# Patient Record
Sex: Female | Born: 1950
Health system: Southern US, Community
[De-identification: ages and names within clinical notes are randomized; demographics above are authoritative.]

## PROBLEM LIST (undated history)

## (undated) DIAGNOSIS — R5383 Other fatigue: Secondary | ICD-10-CM

## (undated) DIAGNOSIS — R5381 Other malaise: Secondary | ICD-10-CM

## (undated) DIAGNOSIS — E237 Disorder of pituitary gland, unspecified: Secondary | ICD-10-CM

## (undated) DIAGNOSIS — K219 Gastro-esophageal reflux disease without esophagitis: Secondary | ICD-10-CM

## (undated) DIAGNOSIS — R52 Pain, unspecified: Secondary | ICD-10-CM

## (undated) DIAGNOSIS — I69998 Other sequelae following unspecified cerebrovascular disease: Secondary | ICD-10-CM

## (undated) DIAGNOSIS — G5600 Carpal tunnel syndrome, unspecified upper limb: Secondary | ICD-10-CM

## (undated) DIAGNOSIS — M549 Dorsalgia, unspecified: Secondary | ICD-10-CM

## (undated) DIAGNOSIS — I214 Non-ST elevation (NSTEMI) myocardial infarction: Secondary | ICD-10-CM

## (undated) DIAGNOSIS — R0989 Other specified symptoms and signs involving the circulatory and respiratory systems: Secondary | ICD-10-CM

## (undated) DIAGNOSIS — G4730 Sleep apnea, unspecified: Secondary | ICD-10-CM

## (undated) DIAGNOSIS — R51 Headache: Secondary | ICD-10-CM

## (undated) DIAGNOSIS — R579 Shock, unspecified: Secondary | ICD-10-CM

## (undated) DIAGNOSIS — M1990 Unspecified osteoarthritis, unspecified site: Secondary | ICD-10-CM

## (undated) DIAGNOSIS — R519 Headache, unspecified: Secondary | ICD-10-CM

## (undated) DIAGNOSIS — R209 Unspecified disturbances of skin sensation: Secondary | ICD-10-CM

## (undated) DIAGNOSIS — R42 Dizziness and giddiness: Secondary | ICD-10-CM

## (undated) DIAGNOSIS — Z9889 Other specified postprocedural states: Secondary | ICD-10-CM

## (undated) DIAGNOSIS — E785 Hyperlipidemia, unspecified: Secondary | ICD-10-CM

## (undated) DIAGNOSIS — I639 Cerebral infarction, unspecified: Secondary | ICD-10-CM

## (undated) DIAGNOSIS — R011 Cardiac murmur, unspecified: Secondary | ICD-10-CM

## (undated) DIAGNOSIS — R7309 Other abnormal glucose: Secondary | ICD-10-CM

## (undated) DIAGNOSIS — I10 Essential (primary) hypertension: Secondary | ICD-10-CM

## (undated) DIAGNOSIS — H539 Unspecified visual disturbance: Secondary | ICD-10-CM

## (undated) DIAGNOSIS — Z6841 Body Mass Index (BMI) 40.0 and over, adult: Secondary | ICD-10-CM

## (undated) HISTORY — DX: Dorsalgia, unspecified: M54.9

## (undated) HISTORY — DX: Disorder of pituitary gland, unspecified: E23.7

## (undated) HISTORY — DX: Gastro-esophageal reflux disease without esophagitis: K21.9

## (undated) HISTORY — DX: Other fatigue: R53.83

## (undated) HISTORY — PX: JOINT REPLACEMENT: SHX530

## (undated) HISTORY — DX: Unspecified disturbances of skin sensation: R20.9

## (undated) HISTORY — DX: Other abnormal glucose: R73.09

## (undated) HISTORY — DX: Dizziness and giddiness: R42

## (undated) HISTORY — DX: Other specified symptoms and signs involving the circulatory and respiratory systems: R09.89

## (undated) HISTORY — DX: Body Mass Index (BMI) 40.0 and over, adult: Z684

## (undated) HISTORY — DX: Hyperlipidemia, unspecified: E78.5

## (undated) HISTORY — DX: Other sequelae following unspecified cerebrovascular disease: I69.998

## (undated) HISTORY — PX: CARPAL TUNNEL RELEASE: SHX101

## (undated) HISTORY — DX: Essential (primary) hypertension: I10

## (undated) HISTORY — DX: Pain, unspecified: R52

## (undated) HISTORY — DX: Other specified postprocedural states: Z98.89

## (undated) HISTORY — DX: Carpal tunnel syndrome, unspecified upper limb: G56.00

## (undated) HISTORY — DX: Other fatigue: R53.81

## (undated) HISTORY — DX: Unspecified visual disturbance: H53.9

---

## 1972-10-15 HISTORY — PX: TUBAL LIGATION: SHX77

## 2004-09-03 ENCOUNTER — Emergency Department (HOSPITAL_COMMUNITY): Admission: EM | Admit: 2004-09-03 | Discharge: 2004-09-03 | Payer: Self-pay | Admitting: Emergency Medicine

## 2004-12-13 ENCOUNTER — Emergency Department (HOSPITAL_COMMUNITY): Admission: EM | Admit: 2004-12-13 | Discharge: 2004-12-13 | Payer: Self-pay | Admitting: Emergency Medicine

## 2005-06-06 ENCOUNTER — Emergency Department (HOSPITAL_COMMUNITY): Admission: EM | Admit: 2005-06-06 | Discharge: 2005-06-07 | Payer: Self-pay | Admitting: Emergency Medicine

## 2010-11-01 LAB — CBC
HCT: 36.9 % (ref 36.0–46.0)
Hemoglobin: 12.7 g/dL (ref 12.0–15.0)
MCH: 30.4 pg (ref 26.0–34.0)
MCHC: 34.4 g/dL (ref 30.0–36.0)
MCV: 88.3 fL (ref 78.0–100.0)
Platelets: 254 10*3/uL (ref 150–400)
RBC: 4.18 MIL/uL (ref 3.87–5.11)
RDW: 14.1 % (ref 11.5–15.5)
WBC: 5.4 10*3/uL (ref 4.0–10.5)

## 2010-11-01 LAB — DIFFERENTIAL
Basophils Absolute: 0.1 10*3/uL (ref 0.0–0.1)
Basophils Relative: 1 % (ref 0–1)
Eosinophils Absolute: 0.3 10*3/uL (ref 0.0–0.7)
Eosinophils Relative: 5 % (ref 0–5)
Lymphocytes Relative: 45 % (ref 12–46)
Lymphs Abs: 2.4 10*3/uL (ref 0.7–4.0)
Monocytes Absolute: 0.5 10*3/uL (ref 0.1–1.0)
Monocytes Relative: 9 % (ref 3–12)
Neutro Abs: 2.2 10*3/uL (ref 1.7–7.7)
Neutrophils Relative %: 40 % — ABNORMAL LOW (ref 43–77)

## 2010-11-01 LAB — BASIC METABOLIC PANEL
BUN: 11 mg/dL (ref 6–23)
CO2: 30 mEq/L (ref 19–32)
Calcium: 9.3 mg/dL (ref 8.4–10.5)
Chloride: 104 mEq/L (ref 96–112)
Creatinine, Ser: 1.02 mg/dL (ref 0.4–1.2)
GFR calc Af Amer: >60 mL/min (ref >60)
GFR calc non Af Amer: 55 mL/min — ABNORMAL LOW (ref >60)
Glucose, Bld: 117 mg/dL — ABNORMAL HIGH (ref 70–99)
Potassium: 3.7 mEq/L (ref 3.5–5.1)
Sodium: 141 mEq/L (ref 135–145)

## 2010-11-01 LAB — URINALYSIS, ROUTINE W REFLEX MICROSCOPIC
Bilirubin Urine: NEGATIVE
Hgb urine dipstick: NEGATIVE
Ketones, ur: NEGATIVE mg/dL
Nitrite: NEGATIVE
Protein, ur: NEGATIVE mg/dL
Specific Gravity, Urine: 1.019 (ref 1.005–1.030)
Urine Glucose, Fasting: NEGATIVE mg/dL
Urobilinogen, UA: 0.2 mg/dL (ref 0.0–1.0)
pH: 6 (ref 5.0–8.0)

## 2010-11-01 LAB — SURGICAL PCR SCREEN
MRSA, PCR: NEGATIVE
Staphylococcus aureus: NEGATIVE

## 2010-11-01 LAB — PROTIME-INR
INR: 1 (ref 0.00–1.49)
Prothrombin Time: 13.4 seconds (ref 11.6–15.2)

## 2010-11-01 LAB — APTT: aPTT: 30 seconds (ref 24–37)

## 2010-11-06 ENCOUNTER — Inpatient Hospital Stay (HOSPITAL_COMMUNITY)
Admission: RE | Admit: 2010-11-06 | Discharge: 2010-11-09 | Payer: Self-pay | Source: Home / Self Care | Attending: Orthopedic Surgery | Admitting: Orthopedic Surgery

## 2010-11-06 HISTORY — PX: TOTAL HIP ARTHROPLASTY: SHX124

## 2010-11-06 NOTE — H&P (Signed)
NAME:  Sherry Fitzpatrick, Sherry Fitzpatrick               ACCOUNT NO.:  [id number]  MEDICAL RECORD NO.:  [id number]          PATIENT TYPE:  INP  LOCATION:  NA                           FACILITY:  [facility]  PHYSICIAN:  Pietro Cassis. Alvan Dame, M.D.  DATE OF BIRTH:  Mar 02, 1951  DATE OF ADMISSION: DATE OF DISCHARGE:                             HISTORY & PHYSICAL   ADMISSION DIAGNOSIS:  Right hip osteoarthritis.  BRIEF HISTORY:  This is a patient that is seen here for second opinion, evaluation of her right hip.  After failed conservative treatment and other practices, she has decided to proceed with right hip arthroplasty and forego any further conservative treatment.  PAST MEDICAL HISTORY: 1. Significant for a TIA in May 2011. 2. She has hypertension. 3. She reports having a heart murmur, although I do not hear that     today. 4. She has reflux. 5. She is postmenopausal. 6. She has some occasional constipation with GERD. 7. Fatigue-type syndrome; back pain, morning stiffness, joint pain.  Her only surgical history is tubal ligation in 1975.  CURRENT MEDICATION:  List is somewhat incomplete, but she does report having: 1. Diovan 160 daily. 2. Amlodipine 5 mg daily. 3. HCTZ 25 mg a day. 4. Plavix 75 a day. 5. Aspirin 81 a day. 6. Metoprolol XL, she is unsure of her dose. 7. Tramadol 50 p.r.n. 8. Clonidine 0.3 daily. 9. Cymbalta 30 mg daily.  ALLERGIES:  PENICILLIN, CODEINE AND MOST NARCOTICS, LIKE HYDROCODONE AND OXYCODONE.  SOCIAL HISTORY:  The patient is divorced.  She has no history of tobacco use.  She has alcoholic beverage socially.  No history of street drugs. She has 2 children.  FAMILY HISTORY:  Noncontributory.  Her disposition plan is for a SNF, as she lives alone.  Her family is going to research that between now and the hospitalization.  REVIEW OF SYSTEMS:  Notable for those difficulties described in the history present illness, past medical history.  Review of systems sheet is  otherwise unremarkable.  PHYSICAL EXAMINATION:  VITAL SIGNS:  The patient is somewhat obese.  She does not report her height and age.  Her blood pressure today is 110/71, respirations are 20, pulse is 80. GENERAL HEALTH:  Poor.  She is postmenopausal. HEENT:  Within normal limits. NECK AND CHEST EXAM:  Shows that she is within normal limits with no difficulties. HEART:  I do not hear a murmur today.  Has S1, S2.  She has a history of hypertension. ABDOMINAL EXAM:  Shows her to be somewhat distended.  She has a history of GERD and constipation. GU:  Otherwise unremarkable. EXTREMITY EXAM:  Shows osteoarthritis and pain in the lower back. DERMATOLOGICALLY:  She is intact. NEUROLOGICALLY:  She has fatigue syndrome.  Her labs, EKG and chest x-ray are pending through [facility].  IMPRESSION:  Right hip osteoarthritis.  PLAN:  Right total hip arthroplasty on the 23rd with Dr. Alvan Dame.  She will not be started on Xarelto; she will return to her Plavix postoperatively.  MiraLax, Colace, iron and Robaxin were given to hertoday.  Her pain medicines will be given to her at discharge.  Russell L. Judeth Horn, RN   ______________________________ Pietro Cassis Alvan Dame, M.D.    RLW/MEDQ  D:  11/02/2010  T:  11/02/2010  Job:  [id number]  Electronically Signed by Beatris Ship NP-C on 11/03/2010 09:48:08 AM Electronically Signed by Paralee Cancel M.D. on 11/06/2010 09:36:32 AM

## 2010-11-07 LAB — ABO/RH: ABO/RH(D): A NEG

## 2010-11-07 LAB — TYPE AND SCREEN
ABO/RH(D): A NEG
Antibody Screen: NEGATIVE

## 2010-11-08 LAB — BASIC METABOLIC PANEL
BUN: 9 mg/dL (ref 6–23)
BUN: 10 mg/dL (ref 6–23)
CO2: 26 mEq/L (ref 19–32)
CO2: 27 mEq/L (ref 19–32)
Calcium: 8.3 mg/dL — ABNORMAL LOW (ref 8.4–10.5)
Calcium: 8.2 mg/dL — ABNORMAL LOW (ref 8.4–10.5)
Chloride: 105 mEq/L (ref 96–112)
Chloride: 104 mEq/L (ref 96–112)
Creatinine, Ser: 0.86 mg/dL (ref 0.4–1.2)
Creatinine, Ser: 1.04 mg/dL (ref 0.4–1.2)
GFR calc Af Amer: >60 mL/min (ref >60)
GFR calc Af Amer: >60 mL/min (ref >60)
GFR calc non Af Amer: >60 mL/min (ref >60)
GFR calc non Af Amer: 54 mL/min — ABNORMAL LOW (ref >60)
Glucose, Bld: 132 mg/dL — ABNORMAL HIGH (ref 70–99)
Glucose, Bld: 136 mg/dL — ABNORMAL HIGH (ref 70–99)
Potassium: 3.5 mEq/L (ref 3.5–5.1)
Potassium: 3.7 mEq/L (ref 3.5–5.1)
Sodium: 140 mEq/L (ref 135–145)
Sodium: 138 mEq/L (ref 135–145)

## 2010-11-08 LAB — CBC
HCT: 29.7 % — ABNORMAL LOW (ref 36.0–46.0)
HCT: 25.6 % — ABNORMAL LOW (ref 36.0–46.0)
Hemoglobin: 10.1 g/dL — ABNORMAL LOW (ref 12.0–15.0)
Hemoglobin: 8.8 g/dL — ABNORMAL LOW (ref 12.0–15.0)
MCH: 29.4 pg (ref 26.0–34.0)
MCH: 30.2 pg (ref 26.0–34.0)
MCHC: 34 g/dL (ref 30.0–36.0)
MCHC: 34.4 g/dL (ref 30.0–36.0)
MCV: 86.6 fL (ref 78.0–100.0)
MCV: 88 fL (ref 78.0–100.0)
Platelets: 285 10*3/uL (ref 150–400)
Platelets: 249 10*3/uL (ref 150–400)
RBC: 3.43 MIL/uL — ABNORMAL LOW (ref 3.87–5.11)
RBC: 2.91 MIL/uL — ABNORMAL LOW (ref 3.87–5.11)
RDW: 14.3 % (ref 11.5–15.5)
RDW: 15 % (ref 11.5–15.5)
WBC: 11.3 10*3/uL — ABNORMAL HIGH (ref 4.0–10.5)
WBC: 14.1 10*3/uL — ABNORMAL HIGH (ref 4.0–10.5)

## 2010-11-08 LAB — CARDIAC PANEL(CRET KIN+CKTOT+MB+TROPI)
CK, MB: 3.5 ng/mL (ref 0.3–4.0)
CK, MB: 3 ng/mL (ref 0.3–4.0)
Relative Index: 0.5 (ref 0.0–2.5)
Relative Index: 0.5 (ref 0.0–2.5)
Total CK: 719 U/L — ABNORMAL HIGH (ref 7–177)
Total CK: 637 U/L — ABNORMAL HIGH (ref 7–177)
Troponin I: 0.04 ng/mL (ref 0.00–0.06)
Troponin I: 0.02 ng/mL (ref 0.00–0.06)

## 2010-11-09 LAB — CARDIAC PANEL(CRET KIN+CKTOT+MB+TROPI)
CK, MB: 2.6 ng/mL (ref 0.3–4.0)
Relative Index: 0.5 (ref 0.0–2.5)
Total CK: 563 U/L — ABNORMAL HIGH (ref 7–177)
Troponin I: 0.02 ng/mL (ref 0.00–0.06)

## 2010-11-13 NOTE — Op Note (Signed)
NAME:  Sherry Fitzpatrick, Sherry Fitzpatrick               ACCOUNT NO.:  [id number]  MEDICAL RECORD NO.:  [id number]          PATIENT TYPE:  INP  LOCATION:  0009                         FACILITY:  [facility]  PHYSICIAN:  Pietro Cassis. Alvan Dame, M.D.  DATE OF BIRTH:  1951/08/25  DATE OF PROCEDURE:  11/06/2010 DATE OF DISCHARGE:                              OPERATIVE REPORT   PREOPERATIVE DIAGNOSIS:  Right hip osteoarthritis.  POSTOPERATIVE DIAGNOSIS:  Right hip osteoarthritis.  PROCEDURE:  Right total-hip replacement.  COMPONENTS USED:  DePuy hip system size 52 pinnacle cup, single cancellous screw, 36 +4 neutral Altrex liner, size 5 high Trilock stem with a 36 +1.5 Delta ceramic ball.  SURGEON:  Pietro Cassis. Alvan Dame, M.D.  ASSISTANT:  Isa Rankin. Judeth Horn, RN  ANESTHESIA:  General.  SPECIMENS:  None.  COMPLICATIONS:  None.  DRAINS:  One Hemovac.  ESTIMATED BLOOD LOSS:  Approximately 550 cc.  INDICATIONS FOR PROCEDURE:  Sherry Fitzpatrick is a 60 year old female whopresented to the office for evaluation of right hip pain.  Radiographs revealed bone-on-bone changes in the acetabulum.  She had failed to manage this conservatively with medication.  After reviewing with her the limited options available conservatively after reduction of her overall quality of life and function, she wished to proceed with hip arthroplasty.  The risks of infection, DVT, component failure, dislocation and need for revision surgery were all discussed and reviewed with this 60 year old female.  Consent was obtained for the benefit of pain relief.  PROCEDURE IN DETAIL:  The patient was brought to the operative theater. Once adequate anesthesia, preoperative antibiotics, clindamycin administered, she was positioned in the left lateral decubitus position with the right side up.  The right lower extremity was then prepped and draped in the sterile fashion.  The time-out was performed identifying the patient, the planned procedure and the  extremity.  A lateral based incision was made based off the proximal trochanter. Sharp dissection was carried to the iliotibial band and gluteal fascia. These were incised posteriorly for a posterior approach.  Short external rotators were taken down separate from the posterior capsule.  An L capsulotomy was made and the hip was dislocated.  Identifying the trochanteric fossa and landmarks of the femoral neck and head, the neck osteotomy was made based off the preoperative radiographs.  Following this, attention was first directed to the femur.  Femoral retractors were placed and the proximal femur was opened with a box cutter and then a starting reamer and then hand reamed once and then irrigated to try to prevent fat emboli.  I then began broaching with a 0 broach and broached up to a size 4 initially, which sat at the level of the neck cut.  I then packed off the femur and now attended to the acetabulum.  The acetabular retractors were placed.  The labrum was debrided.  I began reaming with a 46 reamer and reamed up to a 51 reamer with good bony bed preparation.  The 52 pinnacle cup utilizing the curved impactor was utilized and the cup impacted.  It appeared to be positioned with a portion of the anterior rim exposed, a portion  of the cup exposed superior and lateral and using the cup guide at about 35-40 degrees of abduction.  A single cancellus screw was utilized to support this. Based on the anteversion of the femoral stem and the cup position, I went ahead and impacted a 36 +4 neutral Altrex liner.  At this point, a trial reduction was carried out with a 4 broach in place, high offset neck and a 36 +1.5 ball.  During the trial reduction the hip was noted to be very stable.  The leg lengths appeared to be comparable to the down leg in comparison and there was no evidence of impingement on the soft tissues.  Given this, when I reduced the hip, I checked the torque on the 4  broach.  There was a little bit of play in it.  For that reason I ended up going up to a size 5 broach.  The 5 broach was able to be seated at the level of the neck cut where the 4 broach was.  We chose a 5 high Trilock stem.  The final component was opened and impacted.  It was maybe a millimeter above the neck cut, which I think was acceptable from a leg length standpoint.  Based on this and the trial reduction, a 36 +1.5 Delta ceramic ball was opened and impacted onto a clean and dry trunnion.  The hip was reduced.  The hip had been irrigated throughout the case and again at this point.  I placed a medium Hemovac drain deep.  I then reapproximated the iliotibial band and gluteal fascia over the top of this.  The remaining of the wound was closed with 2-0 Vicryl and the subcutaneous layer with running 4-0 Monocryl.  The hip was then cleaned, dried and dressed sterilely with Aquacel and an Aquacel dressing.  The drain site was dressed separately.  She was then extubated and brought to the recovery room in stable condition tolerating the procedure well.     Pietro Cassis Alvan Dame, M.D.     MDO/MEDQ  D:  11/06/2010  T:  11/06/2010  Job:  [id number]  Electronically Signed by Paralee Cancel M.D. on 11/13/2010 07:05:39 AM

## 2010-11-23 NOTE — Consult Note (Signed)
NAME:  Sherry Fitzpatrick, Sherry Fitzpatrick               ACCOUNT NO.:  [id number]  MEDICAL RECORD NO.:  [id number]          PATIENT TYPE:  INP  LOCATION:  1612                         FACILITY:  [facility]  PHYSICIAN:  Champ Mungo. Lovena Le, MD    DATE OF BIRTH:  09-12-1951  DATE OF CONSULTATION:  11/08/2010 DATE OF DISCHARGE:                                CONSULTATION   PRIMARY CARDIOLOGIST:  Dr. Rich Reining at [facility].  PRIMARY CARE PROVIDER:  Candace Gallus, M.D.  REQUESTING PHYSICIAN:  Pietro Cassis. Alvan Dame, M.D.  PATIENT PROFILE:  A 60 year old African-American female with history of atypical chest pain as well as multiple risk factors for coronary artery disease, who presented for right total hip replacement and had chest pain this morning.  PROBLEMS: 1. Chest pain without objective evidence of ischemia.  A.  The patient     reports having a negative Myoview approximately 5 years ago. 2. Hypertension. 3. Hyperlipidemia. 4. History of transient ischemic attack in 2004 and again May 2011. 5. Osteoarthritis.  A.  Status post right total hip replacement     January 23. 6. History of heart murmur. 7. Gastroesophageal reflux disease. 8. Constipation. 9. Chronic fatigue syndrome with back pain. a.m. stiffness, and joint     pain. 10.Status post tubal ligation 1995. 11.Obesity.  ALLERGIES:  PENICILLIN, OXYCODONE/APAP, CONTRAST.  HISTORY OF PRESENT ILLNESS:  A 60 year old female with a history of atypical chest pain in the past with negative Myoview about 5 years ago. She also has a hypertension, hyperlipidemia, obesity, and TIAs with last occurring May 2011.  The patient's activity at home has been limited by chronic fatigue and pain along with osteoarthritis and right hip pain. She left saw her primary cardiologist approximately 2 weeks ago and was felt to be low risk for pending right hip surgery.  The patient presented to [facility] January 23 and underwent right total  hip replacement and initially did well postoperatively, though she has been anemic, drop in H and H from 10/29 to 8/25.  She has complained of constipation and did have bowel movement.  This morning, she was getting up from bed and trying to get to the chair, noted a faint midsternal chest pressure associated with mild nausea, gas, and belching.  Symptoms resolved after about 5 minutes with belching.  ECG has been done and shows no acute changes.  The patient is currently symptom-free. At baseline, she denies getting chest pain with activity or in generally does not experience dyspnea, although her activity has been limited related to hip pain.  CURRENT MEDICATIONS: 1. Amlodipine 10 mg daily. 2. Vitamin D3 2000 units daily. 3. Clonidine 0.3 mg b.i.d. 4. Colace 100 mg b.i.d. 5. Cymbalta 30 mg b.i.d. 6. Enoxaparin 40 mg daily. 7. Pepcid 20 mg b.i.d. 8. Ferrous sulfate 325 mg t.i.d. 9. Hydrochlorothiazide 25 mg daily. 10.Toprol XL 25 mg daily. 11.Multivitamin daily. 12.Benicar 20 mg daily. 13.K-Dur 10 mEq daily. 14.Crestor 5 mg q.h.s. 15.Senna 1 tablet b.i.d. 16.Vitamin D daily.  FAMILY HISTORY:  Mother died of stroke at 11.  Father died at 77 with history of stroke x2 and  Alzheimer's.  She has three sisters, one with dementia, one with coronary artery disease and one who she believes is alive and well.  SOCIAL HISTORY:  She lives in [place] by herself.  She works in state housing for disabled individuals.  She does not work.  She denies tobacco, alcohol, or drug use.  She is not routinely exercising.  REVIEW OF SYSTEMS:  Positive for feeling cold.  She had chest pain this morning as outlined above.  She has chronic myalgias and arthralgias as well as weakness.  She still has some right hip pain.  She is full code. Otherwise all systems reviewed and negative.  PHYSICAL EXAMINATION:  VITAL SIGNS:  Temperature 98.2, heart rate 71, respirations 18, blood pressure 100/67,  pulse ox 93% on room air. GENERAL:  Pleasant African-American female in no acute distress, awake, alert x3.  She has normal affect. HEENT:  Normal.  Nares grossly nonfocal. SKIN:  Warm without lesions or masses. NECK:  Supple without bruits, JVD. LUNGS:  Respirations are unlabored. CARDIAC:  Regular, S1 and S2 with 2/6 systolic ejection murmur at the bilateral upper sternal borders. ABDOMEN:  Obese, soft, nontender, nondistended.  Bowel sounds present. EXTREMITIES:  Warm and dry.  No clubbing, cyanosis, or edema.  Dorsalis pedis and posterior tibial pulses 2+ bilaterally.  LABORATORY DATA:  EKG shows sinus rhythm, rate of 62, normal axis, T- wave inversion in III and V6 with ST and T flattening in aVF and V5, this is unchanged from old EKG dated October 24, 2010.  Lab work; hemoglobin 8.8, hematocrit 25.6, WBC 14.1, platelets 249,000.  Sodium 138, potassium 3.7, chloride 104, CO2 27, BUN 10, creatinine 0.04, glucose 136, calcium 8.2.  ASSESSMENT AND PLAN: 1. Chest pain without objective evidence of ischemia, somewhat     atypical chest pain this morning associated with nausea and     improved with belching.  ECG is without acute changes.  Doubt     ischemia.  The patient does have multiple risk factors; however,     will go ahead and check cardiac markers.  If negative, would not     pursue additional evaluation unless symptoms recur.  Continue     Pepcid. 2. Hypertension, well controlled on multiple medications. 3. Osteoarthritis status post right total hip replacement per ortho. 4. Anemia.  Follow.     Murray Hodgkins, ANP   ______________________________ Champ Mungo. Lovena Le, MD    CB/MEDQ  D:  11/08/2010  T:  11/08/2010  Job:  [id number]  Electronically Signed by Murray Hodgkins ANP on 11/13/2010 12:30:40 PM Electronically Signed by Cristopher Peru MD on 11/23/2010 05:17:24 PM

## 2010-11-26 NOTE — Discharge Summary (Signed)
  NAME:  Sherry Fitzpatrick, Sherry Fitzpatrick               ACCOUNT NO.:  [id number]  MEDICAL RECORD NO.:  [id number]          PATIENT TYPE:  INP  LOCATION:  1612                         FACILITY:  [facility]  PHYSICIAN:  Pietro Cassis. Alvan Dame, M.D.  DATE OF BIRTH:  [birth year]  DATE OF ADMISSION:  11/06/2010 DATE OF DISCHARGE:                              DISCHARGE SUMMARY   ADMITTING DIAGNOSIS:  Right hip osteoarthritis.  BRIEF HISTORY:  This is a patient that was seen for a second opinion and evaluation of her right hip in our office after failed conservative treatment at other practices.  She has decided to proceed with a right hip arthroplasty and forego any further conservative treatment.  HOSPITAL COURSE:  The patient was admitted on January 23 through same- day surgery, taken to the operating theater and underwent the right hip arthroplasty without any difficulties.  She was taken to PACU for recovery and brought to [facility] for further recovery and rehabilitation. Her discharge condition is good.  Since she has been on [facility] she has advanced her diet to regular.  She has been up with physical therapy and done well with that.  She will be discharged to a facility today for further rehabilitation.  She will follow up with Dr. Alvan Dame in 2 weeks.  LABORATORY DATA:  Her labs have been stable.  PHYSICAL EXAMINATION:  She is afebrile.  Her vital signs are stable.  DISCHARGE INSTRUCTIONS:  Discharge instructions were given.  Her discharge condition again is good.  She knows that she can shower.  She has an Aquacel dressing in place that needs to come off in 7 days and she will follow up with Dr. Alvan Dame in 2 weeks.  DISCHARGE MEDICATIONS: 1. Acetaminophen 325 mg one to two as needed every 4 hours. 2. Colace 100 mg twice daily. 3. Ferrous sulfate 325 mg three times a day. 4. Robaxin 500 mg every 6 hours as needed. 5. MiraLax 17 grams a day as needed. 6. Amlodipine 10 mg a day. 7. Enteric-coated aspirin 81 mg a  day. 8. Clonidine 0.3 mg twice daily. 9. Crestor 5 mg at bedtime. 10.Cymbalta 30 mg twice daily. 11.Diovan 160 mg daily. 12.Hydrochlorothiazide 25 mg every morning. 13.Loratadine 10 mg as needed. 14.Metoprolol 25 mg every evening. 15.Multivitamins daily. 16.Plavix 75 mg daily. 17.Potassium 10 mEq daily. 18.Ranitidine 150 mg twice daily. 19.Tramadol 50 mg two as needed. 20.Ventolin inhaler every 6 hours. 21.Vitamin B and D daily.     Russell L. Judeth Horn, RN   ______________________________ Pietro Cassis Alvan Dame, M.D.    RLW/MEDQ  D:  11/09/2010  T:  11/09/2010  Job:  [id number]  Electronically Signed by Beatris Ship NP-C on 11/22/2010 09:42:16 AM Electronically Signed by Paralee Cancel M.D. on 11/26/2010 09:16:46 AM

## 2011-11-20 DIAGNOSIS — F32A Depression, unspecified: Secondary | ICD-10-CM | POA: Diagnosis present

## 2011-11-20 DIAGNOSIS — K219 Gastro-esophageal reflux disease without esophagitis: Secondary | ICD-10-CM | POA: Diagnosis present

## 2011-11-20 DIAGNOSIS — F419 Anxiety disorder, unspecified: Secondary | ICD-10-CM | POA: Diagnosis present

## 2012-06-13 HISTORY — PX: TRANSPHENOIDAL / TRANSNASAL HYPOPHYSECTOMY / RESECTION PITUITARY TUMOR: SUR1382

## 2013-05-06 ENCOUNTER — Encounter: Payer: Self-pay | Admitting: Neurology

## 2013-05-06 ENCOUNTER — Ambulatory Visit (INDEPENDENT_AMBULATORY_CARE_PROVIDER_SITE_OTHER): Payer: Medicare HMO | Admitting: Neurology

## 2013-05-06 VITALS — BP 181/97 | HR 70 | Temp 97.8°F | Ht 64.0 in | Wt 241.0 lb

## 2013-05-06 DIAGNOSIS — Z8673 Personal history of transient ischemic attack (TIA), and cerebral infarction without residual deficits: Secondary | ICD-10-CM

## 2013-05-06 DIAGNOSIS — R400 Somnolence: Secondary | ICD-10-CM

## 2013-05-06 DIAGNOSIS — I10 Essential (primary) hypertension: Secondary | ICD-10-CM

## 2013-05-06 DIAGNOSIS — G4733 Obstructive sleep apnea (adult) (pediatric): Secondary | ICD-10-CM

## 2013-05-06 DIAGNOSIS — G4710 Hypersomnia, unspecified: Secondary | ICD-10-CM

## 2013-05-06 NOTE — Progress Notes (Signed)
Subjective:    Patient ID: Sherry Fitzpatrick is a 62 y.o. female.  HPI  Star Age, MD, PhD [facility] [address], [address]O. [place], [place] [address]   Dear Kieth Brightly,  I saw your patient, Sherry Fitzpatrick, upon your kind request in my neurologic clinic today for initial consultation of her sleep disorder, in particular concern for obstructive sleep apnea. The patient is unaccompanied today. As you know, Sherry Fitzpatrick is a very pleasant-year-old right-handed woman with an underlying medical history of obesity, pituitary adenoma, s/p surgery in 8/13, hyperlipidemia, prior TIAs in 2004 (passed out while exercising and confusion) and 02/2010 (R sided numbness and weakness), and hypertension, who has been experiencing daytime somnolence for the past several months. Snoring is reportedly loud.  Her typical bedtime is reported to be around 10 or 11 PM and usual wake time is around 4:30 to 5 AM. Sleep onset typically occurs within a few minutes. She reports feeling not well rested upon awakening. She wakes up on an average 2 in the middle of the night and has to go to the bathroom 2 times on a typical night. She admits to occasional morning headaches.  She reports excessive daytime somnolence (EDS) and Her Epworth Sleepiness Score (ESS) is 13/24 today. She has not fallen asleep while driving. The patient has not been taking a planned nap, but does report falling asleep inadvertently. She denies dreaming in a nap and denies feeling refreshed after a nap.  She has been known to snore for the past many years. Snoring is reportedly marked, and associated with choking sounds and witnessed apneas. The patient admits to a sense of choking or strangling feeling. There is report of rare nighttime reflux, with no nighttime cough experienced. The patient has noted mild RLS symptoms and is known to kick while asleep or before falling asleep. She is a restless sleeper and in the morning, the  bed is quite disheveled.   She denies cataplexy, sleep paralysis, hypnagogic or hypnopompic hallucinations, or sleep attacks. She does not report any vivid dreams, nightmares, dream enactments, or parasomnias, such as sleep talking or sleep walking. The patient has not had a sleep study or a home sleep test.  She consumes 1 caffeinated beverage per day, usually in the form of coffee.  Her sister has OSA and is on CPAP.  Her Past Medical History Is Significant For: Past Medical History  Diagnosis Date  . Other and unspecified hyperlipidemia   . Unspecified visual disturbance   . Generalized pain   . Alterations of sensations, late effect of cerebrovascular disease(438.6)   . Carpal tunnel syndrome   . Other postprocedural status(V45.89)   . Unspecified disorder of the pituitary gland and its hypothalamic control   . Backache, unspecified   . Esophageal reflux   . Body mass index 40.0-44.9, adult   . Unspecified essential hypertension   . Other symptoms involving cardiovascular system   . Other malaise and fatigue   . Other abnormal glucose   . Dizziness and giddiness     Her Past Surgical History Is Significant For: Past Surgical History  Procedure Laterality Date  . Pituitary excision  06/13/2012  . Hip surgery Right 11/06/2010  . Tubal ligation      Her Family History Is Significant For: Family History  Problem Relation Age of Onset  . Hypertension Sister   . Heart failure Sister   . Hypertension Daughter   . Heart failure Mother   . Hypertension  Mother   . Diabetes Father     Her Social History Is Significant For: History   Social History  . Marital Status: Divorced    Spouse Name: N/A    Number of Children: N/A  . Years of Education: N/A   Social History Main Topics  . Smoking status: Never Smoker   . Smokeless tobacco: None  . Alcohol Use: No  . Drug Use: No  . Sexually Active: None   Other Topics Concern  . None   Social History Narrative  . None     Her Allergies Are:  Allergies  Allergen Reactions  . Penicillins Hives  . Percocet (Oxycodone-Acetaminophen) Nausea And Vomiting  :   Her Current Medications Are:  Outpatient Encounter Prescriptions as of 05/06/2013  Medication Sig Dispense Refill  . albuterol (PROVENTIL HFA;VENTOLIN HFA) 108 (90 BASE) MCG/ACT inhaler Inhale 2 puffs into the lungs every 6 (six) hours as needed for wheezing.      Marland Kitchen amLODipine (NORVASC) 10 MG tablet Take 10 mg by mouth daily.      Marland Kitchen atorvastatin (LIPITOR) 80 MG tablet Take 80 mg by mouth daily.      . cholecalciferol (VITAMIN D) 400 UNITS TABS Take 400 Units by mouth daily.      . cloNIDine (CATAPRES) 0.3 MG tablet Take 0.3 mg by mouth 2 (two) times daily.      . clopidogrel (PLAVIX) 75 MG tablet Take 75 mg by mouth daily.      . fluconazole (DIFLUCAN) 150 MG tablet Take 150 mg by mouth once.      . fluticasone (FLOVENT DISKUS) 50 MCG/BLIST diskus inhaler Inhale 1 puff into the lungs 2 (two) times daily.      . hydrochlorothiazide (HYDRODIURIL) 25 MG tablet Take 25 mg by mouth daily.      Marland Kitchen loratadine (CLARITIN) 10 MG tablet Take 10 mg by mouth daily.      . meclizine (ANTIVERT) 12.5 MG tablet Take 12.5 mg by mouth as needed.      . methocarbamol (ROBAXIN) 500 MG tablet Take 500 mg by mouth 4 (four) times daily.      . metoprolol succinate (TOPROL-XL) 25 MG 24 hr tablet Take 25 mg by mouth daily.      . potassium chloride (K-DUR,KLOR-CON) 10 MEQ tablet Take 10 mEq by mouth daily.      . ranitidine (ZANTAC) 150 MG capsule Take 150 mg by mouth 2 (two) times daily.      . traMADol (ULTRAM) 50 MG tablet Take 50 mg by mouth every 6 (six) hours as needed for pain.      . valsartan (DIOVAN) 160 MG tablet Take 160 mg by mouth daily.       No facility-administered encounter medications on file as of 05/06/2013.  :  Review of Systems  Constitutional: Positive for unexpected weight change.  Eyes: Positive for visual disturbance.  Respiratory:       Snoring   Cardiovascular:       Murmur  Musculoskeletal: Positive for myalgias and arthralgias.  Psychiatric/Behavioral: Positive for dysphoric mood.    Objective:  Neurologic Exam  Physical Exam Physical Examination:   Filed Vitals:   05/06/13 0907  BP: 181/97  Pulse: 70  Temp: 97.8 F (36.6 C)    General Examination: The patient is a very pleasant 62 y.o. female in no acute distress. She appears well-developed and well-nourished and well groomed. She is obese.   HEENT: Normocephalic, atraumatic, pupils are equal, round and reactive  to light and accommodation. Funduscopic exam is normal with sharp disc margins noted. Extraocular tracking is good without limitation to gaze excursion or nystagmus noted. Normal smooth pursuit is noted. Hearing is grossly intact. Tympanic membranes are clear bilaterally. Face is symmetric with normal facial animation and normal facial sensation. Speech is clear with no dysarthria noted. There is no hypophonia. There is no lip, neck/head, jaw or voice tremor. Neck is supple with full range of passive and active motion. There are no carotid bruits on auscultation. Oropharynx exam reveals: mild mouth dryness, adequate dental hygiene and marked airway crowding, due to large tongue and narrow airway entry and elongated uvula. Mallampati is class III. Tongue protrudes centrally and palate elevates symmetrically. Tonsils are 1+. Neck size is 17.25 inches.   Chest: Clear to auscultation without wheezing, rhonchi or crackles noted.  Heart: S1+S2+0, regular and normal without murmurs, rubs or gallops noted.   Abdomen: Soft, non-tender and non-distended with normal bowel sounds appreciated on auscultation.  Extremities: There is trace pitting edema in the distal lower extremities bilaterally in the ankles only. Pedal pulses are intact.  Skin: Warm and dry without trophic changes noted. There are no varicose veins.  Musculoskeletal: exam reveals no obvious joint  deformities, tenderness or joint swelling or erythema.   Neurologically:  Mental status: The patient is awake, alert and oriented in all 4 spheres. Her memory, attention, language and knowledge are appropriate. There is no aphasia, agnosia, apraxia or anomia. Speech is clear with normal prosody and enunciation. Thought process is linear. Mood is congruent and affect is normal.  Cranial nerves are as described above under HEENT exam. In addition, shoulder shrug is normal with equal shoulder height noted. Motor exam: Normal bulk, strength and tone is noted. There is no drift, tremor or rebound. Romberg is negative. Reflexes are 1+ in the UEs and knees and absent in both ankles. Toes are downgoing bilaterally. Fine motor skills are intact with normal finger taps, normal hand movements, normal rapid alternating patting, normal foot taps and normal foot agility.  Cerebellar testing shows no dysmetria or intention tremor on finger to nose testing. Heel to shin is unremarkable bilaterally. There is no truncal or gait ataxia.  Sensory exam is intact to light touch, pinprick, vibration, temperature sense and proprioception in the upper and lower extremities.  Gait, station and balance are unremarkable. No veering to one side is noted. No leaning to one side is noted. Posture is age-appropriate and stance is narrow based. No problems turning are noted. She turns en bloc. Tandem walk is unremarkable. Intact toe and heel stance is noted.                Assessment and Plan:   In summary, Sherry Fitzpatrick is a very pleasant 62 y.o.-year old female with a history and physical exam concerning for obstructive sleep apnea (OSA). She has other RF for cardiovascular disease and takes ASA and Plavix.  I had a long chat with the patient about my findings and the diagnosis, its prognosis and treatment options. We talked about medical treatments and non-pharmacological approaches. I explained in particular the risks and  ramifications of untreated moderate to severe OSA, especially with respect to developing cardiovascular disease down the Road, including congestive heart failure, difficult to treat hypertension, cardiac arrhythmias, or stroke. Even type 2 diabetes has in part been linked to untreated OSA. We talked about trying to maintain a healthy lifestyle in general, as well as the importance of weight control.  I encouraged the patient to eat healthy, exercise daily and keep well hydrated, to keep a scheduled bedtime and wake time routine, to not skip any meals and eat healthy snacks in between meals.  I recommended the following at this time: sleep study with potential CPAP.  I explained the sleep test procedure to the patient and also outlined surgical and non-surgical treatment options of OSA including the use of a dental custom-made appliance, upper airway surgery such as pillar implants, radiofrequency surgery, tongue base surgery, and UPPP. I also explained the CPAP treatment option to the patient, who indicated that she would be willing to try CPAP if the need arises. I explained the importance of being compliant with PAP treatment, not only for insurance purposes but primarily for the patient's long term health benefit. I answered all her questions today and the patient was in agreement. I would like to see her back after the sleep study is completed and encouraged her to call with any interim questions, concerns, problems or updates.   Thank you very much for allowing me to participate in the care of this nice patient. If I can be of any further assistance to you please do not hesitate to call me at [phone].  Sincerely,   Star Age, MD, PhD

## 2013-05-06 NOTE — Patient Instructions (Signed)

## 2013-05-11 ENCOUNTER — Ambulatory Visit (INDEPENDENT_AMBULATORY_CARE_PROVIDER_SITE_OTHER): Payer: Medicare HMO | Admitting: Neurology

## 2013-05-11 DIAGNOSIS — I10 Essential (primary) hypertension: Secondary | ICD-10-CM

## 2013-05-11 DIAGNOSIS — G4761 Periodic limb movement disorder: Secondary | ICD-10-CM

## 2013-05-11 DIAGNOSIS — R400 Somnolence: Secondary | ICD-10-CM

## 2013-05-11 DIAGNOSIS — G4710 Hypersomnia, unspecified: Secondary | ICD-10-CM

## 2013-05-11 DIAGNOSIS — G479 Sleep disorder, unspecified: Secondary | ICD-10-CM

## 2013-05-11 DIAGNOSIS — Z8673 Personal history of transient ischemic attack (TIA), and cerebral infarction without residual deficits: Secondary | ICD-10-CM

## 2013-05-11 DIAGNOSIS — G4733 Obstructive sleep apnea (adult) (pediatric): Secondary | ICD-10-CM

## 2013-05-27 ENCOUNTER — Telehealth: Payer: Self-pay | Admitting: Neurology

## 2013-05-27 DIAGNOSIS — G4733 Obstructive sleep apnea (adult) (pediatric): Secondary | ICD-10-CM

## 2013-05-27 NOTE — Telephone Encounter (Signed)
Please call and notify patient that the recent sleep study confirmed the diagnosis of OSA. She did well with CPAP during the study with moderate improvement of the respiratory events. Therefore, I would like start the patient on CPAP at home. I placed the order in the chart.   Arrange for CPAP set up at home through a DME company of patient's choice and fax/route report to PCP and referring MD (if other than PCP).   The patient will also need a follow up appointment with me in 6-8 weeks post set up that has to be scheduled; help the patient schedule this (in a follow-up slot).   Please re-enforce the importance of compliance with treatment and the need for [place] to monitor compliance data.   Once you have spoken to the patient and scheduled the return appointment, you may close this encounter, thanks,   Star Age, MD, PhD [facility]

## 2013-05-28 ENCOUNTER — Encounter: Payer: Self-pay | Admitting: *Deleted

## 2013-05-28 NOTE — Telephone Encounter (Signed)
Called patient to discuss sleep study results.  Discussed findings, recommendations and follow up care.  Patient understood well and all questions were answered.   She is not excited about CPAP therapy, I explained the next three months as a trial period.  I also explained that once she gets the machine, Dr. Rexene Alberts will want to follow up in 6-8 weeks after therapy initiation to make sure it is helping her.  Her orders will go to [place] due to her insurance [facility]). A copy of the sleep study interpretive report as well as a letter with info regarding contact info for the DME company, the importance of CPAP compliance, and the date of the follow up appointment info will be mailed to the patient's home.

## 2015-04-14 ENCOUNTER — Encounter (HOSPITAL_COMMUNITY): Payer: Self-pay | Admitting: Emergency Medicine

## 2015-04-14 ENCOUNTER — Observation Stay (HOSPITAL_COMMUNITY): Payer: Medicare PPO

## 2015-04-14 ENCOUNTER — Emergency Department (HOSPITAL_COMMUNITY): Payer: Medicare PPO

## 2015-04-14 ENCOUNTER — Observation Stay (HOSPITAL_COMMUNITY)
Admission: EM | Admit: 2015-04-14 | Discharge: 2015-04-16 | Disposition: A | Discharge status: Home / Self Care | Payer: Medicare PPO | Attending: Internal Medicine | Admitting: Internal Medicine

## 2015-04-14 DIAGNOSIS — Z6841 Body Mass Index (BMI) 40.0 and over, adult: Secondary | ICD-10-CM

## 2015-04-14 DIAGNOSIS — R072 Precordial pain: Secondary | ICD-10-CM | POA: Diagnosis not present

## 2015-04-14 DIAGNOSIS — E785 Hyperlipidemia, unspecified: Secondary | ICD-10-CM | POA: Diagnosis not present

## 2015-04-14 DIAGNOSIS — R739 Hyperglycemia, unspecified: Secondary | ICD-10-CM | POA: Diagnosis present

## 2015-04-14 DIAGNOSIS — R079 Chest pain, unspecified: Principal | ICD-10-CM | POA: Insufficient documentation

## 2015-04-14 DIAGNOSIS — J4520 Mild intermittent asthma, uncomplicated: Secondary | ICD-10-CM

## 2015-04-14 DIAGNOSIS — I10 Essential (primary) hypertension: Secondary | ICD-10-CM | POA: Insufficient documentation

## 2015-04-14 DIAGNOSIS — R531 Weakness: Secondary | ICD-10-CM

## 2015-04-14 DIAGNOSIS — I639 Cerebral infarction, unspecified: Secondary | ICD-10-CM

## 2015-04-14 DIAGNOSIS — R200 Anesthesia of skin: Secondary | ICD-10-CM

## 2015-04-14 DIAGNOSIS — J45909 Unspecified asthma, uncomplicated: Secondary | ICD-10-CM | POA: Diagnosis present

## 2015-04-14 HISTORY — DX: Cerebral infarction, unspecified: I63.9

## 2015-04-14 HISTORY — DX: Unspecified osteoarthritis, unspecified site: M19.90

## 2015-04-14 HISTORY — DX: Sleep apnea, unspecified: G47.30

## 2015-04-14 HISTORY — DX: Cardiac murmur, unspecified: R01.1

## 2015-04-14 HISTORY — DX: Headache, unspecified: R51.9

## 2015-04-14 HISTORY — DX: Headache: R51

## 2015-04-14 LAB — COMPREHENSIVE METABOLIC PANEL
ALT: 16 U/L (ref 14–54)
AST: 22 U/L (ref 15–41)
Albumin: 3.7 g/dL (ref 3.5–5.0)
Alkaline Phosphatase: 81 U/L (ref 38–126)
Anion gap: 7 (ref 5–15)
BUN: 15 mg/dL (ref 6–20)
CO2: 29 mmol/L (ref 22–32)
Calcium: 8.6 mg/dL — ABNORMAL LOW (ref 8.9–10.3)
Chloride: 104 mmol/L (ref 101–111)
Creatinine, Ser: 0.95 mg/dL (ref 0.44–1.00)
GFR calc Af Amer: >60 mL/min (ref >60)
GFR calc non Af Amer: >60 mL/min (ref >60)
Glucose, Bld: 114 mg/dL — ABNORMAL HIGH (ref 65–99)
Potassium: 3.6 mmol/L (ref 3.5–5.1)
Sodium: 140 mmol/L (ref 135–145)
TOTAL PROTEIN: 7.7 g/dL (ref 6.5–8.1)
Total Bilirubin: 0.4 mg/dL (ref 0.3–1.2)

## 2015-04-14 LAB — URINALYSIS, ROUTINE W REFLEX MICROSCOPIC
Bilirubin Urine: NEGATIVE
GLUCOSE, UA: NEGATIVE mg/dL
Hgb urine dipstick: NEGATIVE
Ketones, ur: NEGATIVE mg/dL
Leukocytes, UA: NEGATIVE
Nitrite: NEGATIVE
PROTEIN: NEGATIVE mg/dL
SPECIFIC GRAVITY, URINE: 1.016 (ref 1.005–1.030)
Urobilinogen, UA: 0.2 mg/dL (ref 0.0–1.0)
pH: 6.5 (ref 5.0–8.0)

## 2015-04-14 LAB — CBC
HCT: 36.9 % (ref 36.0–46.0)
HEMOGLOBIN: 12.3 g/dL (ref 12.0–15.0)
MCH: 29.4 pg (ref 26.0–34.0)
MCHC: 33.3 g/dL (ref 30.0–36.0)
MCV: 88.3 fL (ref 78.0–100.0)
Platelets: 294 10*3/uL (ref 150–400)
RBC: 4.18 MIL/uL (ref 3.87–5.11)
RDW: 15.1 % (ref 11.5–15.5)
WBC: 5.8 10*3/uL (ref 4.0–10.5)

## 2015-04-14 LAB — CBG MONITORING, ED: GLUCOSE-CAPILLARY: 106 mg/dL — AB (ref 65–99)

## 2015-04-14 LAB — DIFFERENTIAL
Basophils Absolute: 0 10*3/uL (ref 0.0–0.1)
Basophils Relative: 1 % (ref 0–1)
Eosinophils Absolute: 0.2 10*3/uL (ref 0.0–0.7)
Eosinophils Relative: 3 % (ref 0–5)
LYMPHS ABS: 2.6 10*3/uL (ref 0.7–4.0)
LYMPHS PCT: 44 % (ref 12–46)
MONO ABS: 0.5 10*3/uL (ref 0.1–1.0)
Monocytes Relative: 9 % (ref 3–12)
NEUTROS PCT: 43 % (ref 43–77)
Neutro Abs: 2.5 10*3/uL (ref 1.7–7.7)

## 2015-04-14 LAB — I-STAT TROPONIN, ED
TROPONIN I, POC: 0.01 ng/mL (ref 0.00–0.08)
Troponin i, poc: 0 ng/mL (ref 0.00–0.08)

## 2015-04-14 LAB — PROTIME-INR
INR: 0.96 (ref 0.00–1.49)
Prothrombin Time: 13 seconds (ref 11.6–15.2)

## 2015-04-14 LAB — APTT: aPTT: 29 seconds (ref 24–37)

## 2015-04-14 LAB — TROPONIN I

## 2015-04-14 MED ORDER — NITROGLYCERIN 0.4 MG SL SUBL: 0.4000 mg | SUBLINGUAL_TABLET | SUBLINGUAL | Status: DC | PRN

## 2015-04-14 MED ORDER — TRAMADOL HCL 50 MG PO TABS: 50.0000 mg | ORAL_TABLET | Freq: Four times a day (QID) | ORAL | Status: DC | PRN

## 2015-04-14 MED ORDER — LORATADINE 10 MG PO TABS
10.0000 mg | ORAL_TABLET | Freq: Every day | ORAL | Status: DC
  Administered 2015-04-14 – 2015-04-16 (×3): 10 mg via ORAL
  Filled 2015-04-14 (×3): qty 1

## 2015-04-14 MED ORDER — CLOPIDOGREL BISULFATE 75 MG PO TABS
75.0000 mg | ORAL_TABLET | Freq: Every day | ORAL | Status: DC
  Administered 2015-04-15 – 2015-04-16 (×2): 75 mg via ORAL
  Filled 2015-04-14 (×2): qty 1

## 2015-04-14 MED ORDER — FAMOTIDINE 20 MG PO TABS
20.0000 mg | ORAL_TABLET | Freq: Two times a day (BID) | ORAL | Status: DC
  Administered 2015-04-14 – 2015-04-16 (×4): 20 mg via ORAL
  Filled 2015-04-14 (×4): qty 1

## 2015-04-14 MED ORDER — ACETAMINOPHEN 325 MG PO TABS
650.0000 mg | ORAL_TABLET | ORAL | Status: DC | PRN
  Administered 2015-04-14 – 2015-04-16 (×2): 650 mg via ORAL
  Filled 2015-04-14 (×2): qty 2

## 2015-04-14 MED ORDER — METOPROLOL SUCCINATE ER 25 MG PO TB24
25.0000 mg | ORAL_TABLET | Freq: Every day | ORAL | Status: DC
  Filled 2015-04-14: qty 1

## 2015-04-14 MED ORDER — CLONIDINE HCL 0.3 MG PO TABS
0.3000 mg | ORAL_TABLET | Freq: Two times a day (BID) | ORAL | Status: DC
  Administered 2015-04-14 – 2015-04-16 (×4): 0.3 mg via ORAL
  Filled 2015-04-14 (×3): qty 1
  Filled 2015-04-14: qty 3

## 2015-04-14 MED ORDER — MORPHINE SULFATE 4 MG/ML IJ SOLN
4.0000 mg | Freq: Once | INTRAMUSCULAR | Status: AC
  Administered 2015-04-14: 4 mg via INTRAVENOUS
  Filled 2015-04-14: qty 1

## 2015-04-14 MED ORDER — ONDANSETRON HCL 4 MG/2ML IJ SOLN
4.0000 mg | Freq: Four times a day (QID) | INTRAMUSCULAR | Status: DC | PRN
  Filled 2015-04-14: qty 2

## 2015-04-14 MED ORDER — BUDESONIDE 0.25 MG/2ML IN SUSP
0.2500 mg | Freq: Two times a day (BID) | RESPIRATORY_TRACT | Status: DC
  Administered 2015-04-15 – 2015-04-16 (×3): 0.25 mg via RESPIRATORY_TRACT
  Filled 2015-04-14 (×3): qty 2

## 2015-04-14 MED ORDER — FLUTICASONE PROPIONATE (INHAL) 50 MCG/BLIST IN AEPB: 1.0000 | INHALATION_SPRAY | Freq: Two times a day (BID) | RESPIRATORY_TRACT | Status: DC

## 2015-04-14 MED ORDER — STROKE: EARLY STAGES OF RECOVERY BOOK
Freq: Once | Status: DC
  Filled 2015-04-14: qty 1

## 2015-04-14 MED ORDER — ATORVASTATIN CALCIUM 80 MG PO TABS
80.0000 mg | ORAL_TABLET | Freq: Every day | ORAL | Status: DC
  Administered 2015-04-15: 80 mg via ORAL
  Filled 2015-04-14: qty 1

## 2015-04-14 MED ORDER — ENOXAPARIN SODIUM 40 MG/0.4ML ~~LOC~~ SOLN
40.0000 mg | SUBCUTANEOUS | Status: DC
  Administered 2015-04-14 – 2015-04-15 (×2): 40 mg via SUBCUTANEOUS
  Filled 2015-04-14 (×2): qty 0.4

## 2015-04-14 MED ORDER — IRBESARTAN 150 MG PO TABS
150.0000 mg | ORAL_TABLET | Freq: Every day | ORAL | Status: DC
Start: 2015-04-15 — End: 2015-04-16
  Administered 2015-04-15 – 2015-04-16 (×2): 150 mg via ORAL
  Filled 2015-04-14 (×2): qty 1

## 2015-04-14 MED ORDER — SENNOSIDES-DOCUSATE SODIUM 8.6-50 MG PO TABS: 1.0000 | ORAL_TABLET | Freq: Every evening | ORAL | Status: DC | PRN

## 2015-04-14 MED ORDER — ASPIRIN 81 MG PO CHEW
324.0000 mg | CHEWABLE_TABLET | Freq: Once | ORAL | Status: AC
  Administered 2015-04-14: 324 mg via ORAL
  Filled 2015-04-14: qty 4

## 2015-04-14 MED ORDER — CHOLECALCIFEROL 10 MCG (400 UNIT) PO TABS
400.0000 [IU] | ORAL_TABLET | Freq: Every day | ORAL | Status: DC
  Administered 2015-04-14 – 2015-04-16 (×3): 400 [IU] via ORAL
  Filled 2015-04-14 (×3): qty 1

## 2015-04-14 MED ORDER — ACETAMINOPHEN 650 MG RE SUPP: 650.0000 mg | RECTAL | Status: DC | PRN

## 2015-04-14 MED ORDER — ALBUTEROL SULFATE (2.5 MG/3ML) 0.083% IN NEBU: 3.0000 mL | INHALATION_SOLUTION | Freq: Four times a day (QID) | RESPIRATORY_TRACT | Status: DC | PRN

## 2015-04-14 MED ORDER — AMLODIPINE BESYLATE 10 MG PO TABS
10.0000 mg | ORAL_TABLET | Freq: Every day | ORAL | Status: DC
  Administered 2015-04-15 – 2015-04-16 (×2): 10 mg via ORAL
  Filled 2015-04-14 (×2): qty 1

## 2015-04-14 MED ORDER — HYDROCHLOROTHIAZIDE 25 MG PO TABS
25.0000 mg | ORAL_TABLET | Freq: Every day | ORAL | Status: DC
Start: 2015-04-15 — End: 2015-04-16
  Administered 2015-04-15 – 2015-04-16 (×2): 25 mg via ORAL
  Filled 2015-04-14 (×2): qty 1

## 2015-04-14 NOTE — ED Notes (Signed)
RN called and made CT aware of code stroke order, pt being transported to CT

## 2015-04-14 NOTE — ED Notes (Signed)
Attempted to call report, RN not available 

## 2015-04-14 NOTE — ED Notes (Addendum)
Pt reports mid chest pain radiating to right neck and right arm (describes right arm as "sore). Associated dizziness and nausea with pain-all symptoms started around 1000. Denies SOB/back pain/vomiting/back pain. Has had two strokes in the past (2004 and 2011). Takes Plavix for this. Speaking full/clear sentences. RR even/unlabored. Has slight right sided mouth droop. Decreased strength with right hand grip (patient endorses pain and soreness on right arm but says she also feels weaker on the right side). Says last time she had a stroke she had right sided weakness for approximately 4 days but returned to baseline after this. Unable to determine if patient has absent or present drift d/t right arm pain. Able to stand and pivot to the bed. Decreased strength with right leg on neurological assessment. No other c/c.

## 2015-04-14 NOTE — ED Notes (Signed)
Pt refusing to get MRI done at [facility], wants MRI done at [facility].

## 2015-04-14 NOTE — ED Provider Notes (Signed)
CSN: [id number]     Arrival date & time 04/14/15  1222 History   First MD Initiated Contact with Patient 04/14/15 1251     Chief Complaint  Patient presents with  . Chest Pain  . Weakness    Right-Sided     HPI Pt had some general malaise yesterday.  When she woke up she started feeling lightheaded.  She then started to have a bad headache and experienced pain down in her neck and her shoulder.  She also felt a tightness in her chest when she felt the dizziness this am.    She is having weakness on the right side.  In the past she had a stroke that affected her right side but those symptoms completely recovered.  No vomiting or fever.   No history of heart disease or lung disease.  No history of PE or DVT.  She does have history of headaches in the past.  She used to have them a lot but was ultimately found to have a pituitary tumor.  She had surgery for this.  Past Medical History  Diagnosis Date  . Other and unspecified hyperlipidemia   . Unspecified visual disturbance   . Generalized pain   . Alterations of sensations, late effect of cerebrovascular disease(438.6)   . Carpal tunnel syndrome   . Other postprocedural status(V45.89)   . Unspecified disorder of the pituitary gland and its hypothalamic control   . Backache, unspecified   . Esophageal reflux   . Body mass index 40.0-44.9, adult   . Unspecified essential hypertension   . Other symptoms involving cardiovascular system   . Other malaise and fatigue   . Other abnormal glucose   . Dizziness and giddiness    Past Surgical History  Procedure Laterality Date  . Pituitary excision  06/13/2012  . Hip surgery Right 11/06/2010  . Tubal ligation     Family History  Problem Relation Age of Onset  . Hypertension Sister   . Heart failure Sister   . Hypertension Daughter   . Heart failure Mother   . Hypertension Mother   . Diabetes Father    History  Substance Use Topics  . Smoking status: Never Smoker   . Smokeless  tobacco: Not on file  . Alcohol Use: No   OB History    No data available     Review of Systems  Constitutional: Negative for fever.  Respiratory: Negative for cough.   Musculoskeletal: Negative for joint swelling.  All other systems reviewed and are negative.     Allergies  Penicillins and Percocet  Home Medications   Prior to Admission medications   Medication Sig Start Date End Date Taking? Authorizing Provider  albuterol (PROVENTIL HFA;VENTOLIN HFA) 108 (90 BASE) MCG/ACT inhaler Inhale 2 puffs into the lungs every 6 (six) hours as needed for wheezing.   Yes Historical Provider, MD  amLODipine (NORVASC) 10 MG tablet Take 10 mg by mouth daily.   Yes Historical Provider, MD  atorvastatin (LIPITOR) 80 MG tablet Take 80 mg by mouth daily.   Yes Historical Provider, MD  cholecalciferol (VITAMIN D) 400 UNITS TABS Take 400 Units by mouth daily.   Yes Historical Provider, MD  cloNIDine (CATAPRES) 0.3 MG tablet Take 0.3 mg by mouth 2 (two) times daily.   Yes Historical Provider, MD  clopidogrel (PLAVIX) 75 MG tablet Take 75 mg by mouth daily.   Yes Historical Provider, MD  fluticasone (FLOVENT DISKUS) 50 MCG/BLIST diskus inhaler Inhale 1 puff into the  lungs 2 (two) times daily.   Yes Historical Provider, MD  hydrochlorothiazide (HYDRODIURIL) 25 MG tablet Take 25 mg by mouth daily.   Yes Historical Provider, MD  loratadine (CLARITIN) 10 MG tablet Take 10 mg by mouth daily.   Yes Historical Provider, MD  metoprolol succinate (TOPROL-XL) 25 MG 24 hr tablet Take 25 mg by mouth daily.   Yes Historical Provider, MD  ranitidine (ZANTAC) 150 MG capsule Take 150 mg by mouth 2 (two) times daily.   Yes Historical Provider, MD  traMADol (ULTRAM) 50 MG tablet Take 50 mg by mouth every 6 (six) hours as needed for pain.   Yes Historical Provider, MD  valsartan (DIOVAN) 160 MG tablet Take 160 mg by mouth daily.   Yes Historical Provider, MD   BP 142/67 mmHg  Pulse 58  Temp(Src) 98.2 F (36.8 C)  (Oral)  Resp 20  SpO2 95% Physical Exam  Constitutional: She is oriented to person, place, and time. She appears well-developed and well-nourished. No distress.  HENT:  Head: Normocephalic and atraumatic.  Right Ear: External ear normal.  Left Ear: External ear normal.  Mouth/Throat: Oropharynx is clear and moist.  Eyes: Conjunctivae are normal. Right eye exhibits no discharge. Left eye exhibits no discharge. No scleral icterus.  Neck: Neck supple. No tracheal deviation present.  Cardiovascular: Normal rate, regular rhythm and intact distal pulses.   Pulmonary/Chest: Effort normal and breath sounds normal. No stridor. No respiratory distress. She has no wheezes. She has no rales.  Abdominal: Soft. Bowel sounds are normal. She exhibits no distension. There is no tenderness. There is no rebound and no guarding.  Musculoskeletal: She exhibits no edema or tenderness.  Neurological: She is alert and oriented to person, place, and time. She has normal strength. No cranial nerve deficit (no facial droop, extraocular movements intact, no slurred speech) or sensory deficit. She exhibits normal muscle tone. She displays no seizure activity. Coordination normal.  Weakness in right arm and right leg compared to left,  able to hold both legs off bed for 5 seconds, sensation intact in all extremities, no visual field cuts, no left or right sided neglect, normal finger-nose exam bilaterally, no nystagmus noted   Skin: Skin is warm and dry. No rash noted.  Psychiatric: She has a normal mood and affect.  Nursing note and vitals reviewed.   ED Course  Procedures (including critical care time) Labs Review Labs Reviewed  COMPREHENSIVE METABOLIC PANEL - Abnormal; Notable for the following:    Glucose, Bld 114 (*)    Calcium 8.6 (*)    All other components within normal limits  CBG MONITORING, ED - Abnormal; Notable for the following:    Glucose-Capillary 106 (*)    All other components within normal  limits  PROTIME-INR  APTT  CBC  DIFFERENTIAL  URINALYSIS, ROUTINE W REFLEX MICROSCOPIC (NOT AT [facility])  I-STAT TROPOININ, ED  I-STAT TROPOININ, ED    Imaging Review Ct Head Wo Contrast  04/14/2015   CLINICAL DATA:  Pt reports mid chest pain radiating to right neck and right arm (describes right arm as "sore). Associated dizziness and nausea with pain-all symptoms started around 1000. Denies SOB/back pain/vomiting/back pain. Has had two strokes in the past (2004 and 2011). Takes Plavix for this. Speaking full/clear sentences. RR even/unlabored. Has slight right sided mouth droop. Decreased strength with right hand grip (patient endorses pain and soreness on right arm but says she also feels weaker on the right side). Says last time she had a  stroke she had right sided weakness for approximately 4 days but returned to baseline after this. Unable to determine if patient has absent or present drift d/t right arm pain. Able to stand and pivot to the bed. Decreased strength with right leg on neurological assessment. No other c/c.  EXAM: CT HEAD WITHOUT CONTRAST  TECHNIQUE: Contiguous axial images were obtained from the base of the skull through the vertex without intravenous contrast.  COMPARISON:  06/07/2005  FINDINGS: The ventricles are normal in size and configuration. There are no parenchymal masses or mass effect. There is no evidence of a recent cortical infarct. Mild patchy white matter hypoattenuation is noted consistent with chronic microvascular ischemic change.  There are no extra-axial masses or abnormal fluid collections.  There is no intracranial hemorrhage.  No skull lesion. Visualized sinuses and mastoid air cells are clear.  IMPRESSION: 1. No acute intracranial abnormalities. These results were called by telephone at the time of interpretation on 04/14/2015 at 1:30 pm to Dr. Dorie Rank , who verbally acknowledged these results. 2. Mild chronic microvascular ischemic change.   Electronically Signed    By: Lajean Manes M.D.   On: 04/14/2015 13:31     EKG Interpretation   Date/Time:  Thursday April 14 2015 12:29:15 EDT Ventricular Rate:  64 PR Interval:  193 QRS Duration: 84 QT Interval:  399 QTC Calculation: 412 R Axis:   85 Text Interpretation:  Sinus rhythm Borderline right axis deviation  Nonspecific repol abnormality, diffuse leads Baseline wander in lead(s) V6  Poor data quality inferior and lateral t wave changes more pronounced  since last tracing Confirmed by Argelia Formisano  MD-J, [name] [id number]) on 04/14/2015  12:52:33 PM      MDM   Final diagnoses:  Weakness  Chest pain, unspecified chest pain type    Patient's symptoms are concerning for the possibility of an occult stroke. She is outside of any TPA criteria. She noticed her symptoms when she first woke up this morning. Last time known to be normal was last evening.  I will consult with medical service regarding admission for further evaluation, including probably MRI.  Patient also complains of chest pain. Initial troponin was normal. She does have some nonspecific EKG findings. Plan on serial cardiac enzymes    Dorie Rank, MD 04/14/15 1515

## 2015-04-14 NOTE — ED Notes (Addendum)
Patient transported to x-ray. ?

## 2015-04-14 NOTE — ED Notes (Signed)
Patient transported to MRI 

## 2015-04-14 NOTE — ED Notes (Signed)
Bed: WA09 Expected date:  Expected time:  Means of arrival:  Comments: hold 

## 2015-04-14 NOTE — ED Notes (Addendum)
Pt state she does not have to use bathroom at this time. Will info staff when ready to give urine sample

## 2015-04-14 NOTE — H&P (Addendum)
[facility] History and Physical  Sherry Fitzpatrick [id number] DOB: 1951-01-04 DOA: 04/14/2015  Referring physician:  Dorie Rank PCP:  Berkley Harvey, NP   Chief Complaint:  Right sided weakness  HPI:  The patient is a 64 y.o. year-old female with history of previous stroke, hypertension, hyperlipidemia, borderline diabetes, GERD, hx of pituitary tumor excision who presents with chest pain and right sided pain and weakness.  The patient was last at their baseline health two days ago.  The day prior to admission she felt fatigued. She states she woke up today around 4 AM and felt okay. She was able to fix breakfast without problem and drove to her daughter's house to babysit her granddaughter. She denied any focal numbness, tingling, weakness, confusion, slurred speech at that time. She also did not have any shortness of breath or chest pain. Around 9 AM she suddenly developed a bitemporal headache, mild fatigue, and felt very heavy all over. About 30 minutes later, she developed moderate substernal chest pressure which radiated across her entire chest but not to her back. Her chest pain waxed and waned. She is not sure how long the episodes lasted for but not more than several minutes. She had some associated nausea and shortness of breath. She simultaneously developed some weakness of the right upper and right lower extremities with some possible numbness of the right hand. She denied blurred vision, confusion, slurred speech. She was able to call her daughter who came to pick her up and bring her to the hospital.  In the ER, her VS were notable for mild sinus bradycardia and mildly elevated blood pressures.  Labs notable for mildly low calcium and mildly elevated glucose.  UA negative.  Head CT without acute abnormality but incidentally saw mild chronic microvascular ischemic change.  CXR is pending.  She is being admitted for chest pain and TIA vs. acute stroke.  In the emergency department, she  was given aspirin and morphine.  She was reportedly outside of the window for tPA upon arrival to the ER (symptoms present since before 9AM and came to ER at almost 1PM).  Review of Systems:  General:  Denies fevers, chills, weight loss or gain HEENT:  Denies changes to hearing and vision, rhinorrhea, sinus congestion, mild sore throat for the last few days CV:  Denies palpitations, lower extremity edema.  PULM:  Denies wheezing, cough.   GI:  Positive nausea, she vomited several times in the emergency department nonbilious, nonbloody, denies constipation, diarrhea.   GU:  Denies dysuria, frequency, urgency ENDO:  Denies polyuria, polydipsia.   HEME:  Denies hematemesis, blood in stools, melena, abnormal bruising or bleeding.  LYMPH:  Denies lymphadenopathy.   MSK:  Denies arthralgias, myalgias.   DERM:  Denies skin rash or ulcer.   NEURO:  Per history of present illness  PSYCH:  Denies anxiety and depression.    Past Medical History  Diagnosis Date  . Other and unspecified hyperlipidemia   . Unspecified visual disturbance   . Generalized pain   . Alterations of sensations, late effect of cerebrovascular disease(438.6)   . Carpal tunnel syndrome   . Other postprocedural status(V45.89)   . Unspecified disorder of the pituitary gland and its hypothalamic control   . Backache, unspecified   . Esophageal reflux   . Body mass index 40.0-44.9, adult   . Unspecified essential hypertension   . Other symptoms involving cardiovascular system   . Other malaise and fatigue   . Other abnormal glucose   .  Dizziness and giddiness   . Asthma    Past Surgical History  Procedure Laterality Date  . Pituitary excision  06/13/2012  . Hip surgery Right 11/06/2010  . Tubal ligation     Social History:  reports that she has never smoked. She does not have any smokeless tobacco history on file. She reports that she does not drink alcohol or use illicit drugs.  Allergies  Allergen Reactions  .  Penicillins Hives  . Percocet [Oxycodone-Acetaminophen] Nausea And Vomiting    Family History  Problem Relation Age of Onset  . Hypertension Sister   . Heart failure Sister   . Hypertension Daughter   . Heart failure Mother   . Hypertension Mother   . Diabetes Father      Prior to Admission medications   Medication Sig Start Date End Date Taking? Authorizing Provider  albuterol (PROVENTIL HFA;VENTOLIN HFA) 108 (90 BASE) MCG/ACT inhaler Inhale 2 puffs into the lungs every 6 (six) hours as needed for wheezing.   Yes Historical Provider, MD  amLODipine (NORVASC) 10 MG tablet Take 10 mg by mouth daily.   Yes Historical Provider, MD  atorvastatin (LIPITOR) 80 MG tablet Take 80 mg by mouth daily.   Yes Historical Provider, MD  cholecalciferol (VITAMIN D) 400 UNITS TABS Take 400 Units by mouth daily.   Yes Historical Provider, MD  cloNIDine (CATAPRES) 0.3 MG tablet Take 0.3 mg by mouth 2 (two) times daily.   Yes Historical Provider, MD  clopidogrel (PLAVIX) 75 MG tablet Take 75 mg by mouth daily.   Yes Historical Provider, MD  fluticasone (FLOVENT DISKUS) 50 MCG/BLIST diskus inhaler Inhale 1 puff into the lungs 2 (two) times daily.   Yes Historical Provider, MD  hydrochlorothiazide (HYDRODIURIL) 25 MG tablet Take 25 mg by mouth daily.   Yes Historical Provider, MD  loratadine (CLARITIN) 10 MG tablet Take 10 mg by mouth daily.   Yes Historical Provider, MD  metoprolol succinate (TOPROL-XL) 25 MG 24 hr tablet Take 25 mg by mouth daily.   Yes Historical Provider, MD  ranitidine (ZANTAC) 150 MG capsule Take 150 mg by mouth 2 (two) times daily.   Yes Historical Provider, MD  traMADol (ULTRAM) 50 MG tablet Take 50 mg by mouth every 6 (six) hours as needed for pain.   Yes Historical Provider, MD  valsartan (DIOVAN) 160 MG tablet Take 160 mg by mouth daily.   Yes Historical Provider, MD   Physical Exam: Filed Vitals:   04/14/15 1615 04/14/15 1620 04/14/15 1630 04/14/15 1640  BP: 150/76 149/69  149/76   Pulse: 59 57 55   Temp:    97.6 F (36.4 C)  TempSrc:      Resp: 15 14 18    SpO2: 98% 99% 98%      General:  Obese female, no acute distress, vomited several times during my exam  Eyes:  PERRL, anicteric, non-injected.  ENT:  Nares clear.  OP clear, non-erythematous without plaques or exudates.  MMM.  Neck:  Supple without TM or JVD.    Lymph:  No cervical, supraclavicular, or submandibular LAD.  Cardiovascular:  RRR, normal S1, S2, without m/r/g.  2+ pulses, warm extremities  Respiratory:  CTA bilaterally without increased WOB.  Abdomen:  NABS.  Soft, ND/NT.    Skin:  No rashes or focal lesions.  Musculoskeletal:  Normal bulk and tone.  No LE edema.  Psychiatric:  A & O x 4.  Appropriate affect.  Neurologic:  CN 3-12 intact.  5/5 strength left  upper and left lower extremities.  4 out of 5 strength right upper and right lower extremities. Sensation diminished on the right hand, forearm, and leg.  Sensation on her face was intact.  Labs on Admission:  Basic Metabolic Panel:  Recent Labs Lab 04/14/15 1308  NA 140  K 3.6  CL 104  CO2 29  GLUCOSE 114*  BUN 15  CREATININE 0.95  CALCIUM 8.6*   Liver Function Tests:  Recent Labs Lab 04/14/15 1308  AST 22  ALT 16  ALKPHOS 81  BILITOT 0.4  PROT 7.7  ALBUMIN 3.7   No results for input(s): LIPASE, AMYLASE in the last 168 hours. No results for input(s): AMMONIA in the last 168 hours. CBC:  Recent Labs Lab 04/14/15 1308  WBC 5.8  NEUTROABS 2.5  HGB 12.3  HCT 36.9  MCV 88.3  PLT 294   Cardiac Enzymes: No results for input(s): CKTOTAL, CKMB, CKMBINDEX, TROPONINI in the last 168 hours.  BNP (last 3 results) No results for input(s): BNP in the last 8760 hours.  ProBNP (last 3 results) No results for input(s): PROBNP in the last 8760 hours.  CBG:  Recent Labs Lab 04/14/15 1315  GLUCAP 106*    Radiological Exams on Admission: Dg Chest 2 View  04/14/2015   CLINICAL DATA:  Chest  tightness.  Right-sided weakness.  Dizziness.  EXAM: CHEST  2 VIEW  COMPARISON:  10/30/2010  FINDINGS: Cardiac silhouette is borderline enlarged. No mediastinal or hilar masses or convincing adenopathy.  Clear lungs.  No pleural effusion or pneumothorax.  Bony thorax is demineralized but intact.  IMPRESSION: No acute cardiopulmonary disease.   Electronically Signed   By: Lajean Manes M.D.   On: 04/14/2015 15:49   Ct Head Wo Contrast  04/14/2015   CLINICAL DATA:  Pt reports mid chest pain radiating to right neck and right arm (describes right arm as "sore). Associated dizziness and nausea with pain-all symptoms started around 1000. Denies SOB/back pain/vomiting/back pain. Has had two strokes in the past (2004 and 2011). Takes Plavix for this. Speaking full/clear sentences. RR even/unlabored. Has slight right sided mouth droop. Decreased strength with right hand grip (patient endorses pain and soreness on right arm but says she also feels weaker on the right side). Says last time she had a stroke she had right sided weakness for approximately 4 days but returned to baseline after this. Unable to determine if patient has absent or present drift d/t right arm pain. Able to stand and pivot to the bed. Decreased strength with right leg on neurological assessment. No other c/c.  EXAM: CT HEAD WITHOUT CONTRAST  TECHNIQUE: Contiguous axial images were obtained from the base of the skull through the vertex without intravenous contrast.  COMPARISON:  06/07/2005  FINDINGS: The ventricles are normal in size and configuration. There are no parenchymal masses or mass effect. There is no evidence of a recent cortical infarct. Mild patchy white matter hypoattenuation is noted consistent with chronic microvascular ischemic change.  There are no extra-axial masses or abnormal fluid collections.  There is no intracranial hemorrhage.  No skull lesion. Visualized sinuses and mastoid air cells are clear.  IMPRESSION: 1. No acute  intracranial abnormalities. These results were called by telephone at the time of interpretation on 04/14/2015 at 1:30 pm to Dr. Dorie Rank , who verbally acknowledged these results. 2. Mild chronic microvascular ischemic change.   Electronically Signed   By: Lajean Manes M.D.   On: 04/14/2015 13:31    EKG:  NSR with T-wave inversions which are reportedly deeper than prior in the inferolateral leads  Assessment/Plan Active Problems:   Chest pain   Right sided weakness   Hyperlipidemia   Body mass index 40.0-44.9, adult   Essential hypertension   Blood glucose elevated   Asthma  ---  TIA or possible acute stroke with Right sided pain and weakness in a patient with history of two previous strokes.  Head CT without acute change.   -  Admit to telemetry  -  q2h then q4h neuro checks -  MRI brain/MRA head -  Carotid duplex -  ECHO -  Continue plavix 75mg  once daily -  Lipid panel -  Hemoglobin A1c   -  PT/OT/SLP  -  Neurology consult if MRI positive for stroke  Chest pain, likely related to possible TIA or stroke. ACS would not cause leg weakness.   -  Telemetry -  Cycle troponins and ECG -  Risk factor modification as above  -  Continue plavix, high dose statin, BB -  Outpatient evaluation unless troponins elevated -  NTG prn  Hyperglycemia -  A1c -  Daily CBG and if > 140, add SSI  Hypertension/hyperlipidemia, likely not a large stroke given normal appearing CT -  Continue high dose statin -  Continue home blood pressure medications:  Norvasc, clonidine, HCTZ, metoprolol, and valsartan  Asthma, stable, continue flovent and albuterol prn  Diet:  Healthy heart Access:  PIV IVF:  off Proph:  lovenox  Code Status: full Family Communication: patient alone Disposition Plan: Admit to telemetry at [facility]  Time spent: 60 min Janece Canterbury [facility] Pager [phone]  If 7PM-7AM, please contact night-coverage [email or web address] Password [facility] 04/14/2015, 4:47  PM

## 2015-04-14 NOTE — ED Notes (Signed)
Pt being transported to MRI, before leaving department pt was vomiting. Pt has zofran ordered PRN. Pt refused zofran. Reported she did not want MRI performed at [facility], charge explained pt had to get the MRI done at [facility] and then be transported to [facility]. Pt stating she did not want MRI, charge explained that if pt wanted to be admitted she needed to comply with the physicians orders.

## 2015-04-15 ENCOUNTER — Observation Stay (HOSPITAL_BASED_OUTPATIENT_CLINIC_OR_DEPARTMENT_OTHER): Payer: Medicare PPO

## 2015-04-15 ENCOUNTER — Observation Stay (HOSPITAL_COMMUNITY): Payer: Medicare HMO

## 2015-04-15 DIAGNOSIS — I10 Essential (primary) hypertension: Secondary | ICD-10-CM | POA: Diagnosis not present

## 2015-04-15 DIAGNOSIS — E785 Hyperlipidemia, unspecified: Secondary | ICD-10-CM

## 2015-04-15 DIAGNOSIS — R208 Other disturbances of skin sensation: Secondary | ICD-10-CM | POA: Diagnosis not present

## 2015-04-15 DIAGNOSIS — M6289 Other specified disorders of muscle: Secondary | ICD-10-CM | POA: Diagnosis not present

## 2015-04-15 DIAGNOSIS — I6789 Other cerebrovascular disease: Secondary | ICD-10-CM | POA: Diagnosis not present

## 2015-04-15 LAB — LIPID PANEL
CHOL/HDL RATIO: 4.4 ratio
CHOLESTEROL: 160 mg/dL (ref 0–200)
HDL: 36 mg/dL — AB (ref >40)
LDL Cholesterol: 103 mg/dL — ABNORMAL HIGH (ref 0–99)
Triglycerides: 104 mg/dL (ref <150)
VLDL: 21 mg/dL (ref 0–40)

## 2015-04-15 LAB — TROPONIN I: Troponin I: <0.03 ng/mL (ref <0.031)

## 2015-04-15 MED ORDER — METOPROLOL SUCCINATE ER 25 MG PO TB24: 25.0000 mg | ORAL_TABLET | Freq: Every day | ORAL | Status: DC

## 2015-04-15 MED ORDER — METOPROLOL SUCCINATE ER 25 MG PO TB24
25.0000 mg | ORAL_TABLET | Freq: Every day | ORAL | Status: DC
Start: 2015-04-15 — End: 2015-04-16
  Administered 2015-04-15: 25 mg via ORAL
  Filled 2015-04-15: qty 1

## 2015-04-15 NOTE — Progress Notes (Signed)
SLP Cancellation Note  Patient Details Name: ALAYLAH TOMASKO MRN: [id number] DOB: 1951/07/04   Cancelled treatment:       Reason Eval/Treat Not Completed: Patient declined, states no changes in communication nor cognition.  Will sign off.    Juan Quam Laurice 04/15/2015, 3:50 PM

## 2015-04-15 NOTE — Evaluation (Signed)
Physical Therapy Evaluation Patient Details Name: Sherry Fitzpatrick MRN: [id number] DOB: 06/04/1951 Today's Date: 04/15/2015   History of Present Illness  Patient is a 64 y/o female with hx of previous stroke, HTN, HLD, borderline diabetes, GERD and hx of pituitary tumor excision who presents with chest pain and right sided pain/weakness. Head CT, brain MRI- unremarkable. Workup pending.   Clinical Impression  Patient presents with RUE weakness and balance deficits impacting mobility. Pt fatigues quickly during mobility requiring frequent standing rest breaks. Encouraged daily ambulation with RN while in hospital. Will continue to follow to maximize independence, endurance and mobility prior to return home.     Follow Up Recommendations No PT follow up;Supervision for mobility/OOB    Equipment Recommendations  None recommended by PT    Recommendations for Other Services       Precautions / Restrictions Precautions Precautions: Fall Required Braces or Orthoses: Sling Restrictions Weight Bearing Restrictions: No      Mobility  Bed Mobility Overal bed mobility: Needs Assistance Bed Mobility: Supine to Sit     Supine to sit: Modified independent (Device/Increase time)        Transfers Overall transfer level: Needs assistance Equipment used: None Transfers: Sit to/from Stand Sit to Stand: Supervision         General transfer comment: Supervision for safety.   Ambulation/Gait Ambulation/Gait assistance: Min guard Ambulation Distance (Feet): 150 Feet Assistive device: None Gait Pattern/deviations: Step-through pattern;Decreased stride length;Wide base of support Gait velocity: 1.02 ft/sec Gait velocity interpretation: <1.8 ft/sec, indicative of risk for recurrent falls General Gait Details: Pt with very slow, mildly unsteady gait occasionally reaching for rail for support. No overt LOB. Fatigues.   Stairs            Wheelchair Mobility    Modified Rankin  (Stroke Patients Only) Modified Rankin (Stroke Patients Only) Pre-Morbid Rankin Score: Slight disability Modified Rankin: Slight disability     Balance Overall balance assessment: Needs assistance Sitting-balance support: Feet supported;No upper extremity supported Sitting balance-Leahy Scale: Good     Standing balance support: During functional activity Standing balance-Leahy Scale: Fair                               Pertinent Vitals/Pain Pain Assessment: Faces Faces Pain Scale: Hurts a little bit Pain Location: right hip Pain Descriptors / Indicators: Sore Pain Intervention(s): Monitored during session;Repositioned    Home Living Family/patient expects to be discharged to:: Private residence Living Arrangements: Spouse/significant other Available Help at Discharge: Family Type of Home: House Home Access: Level entry     Home Layout: One level Home Equipment: None      Prior Function Level of Independence: Independent               Hand Dominance   Dominant Hand: Left    Extremity/Trunk Assessment   Upper Extremity Assessment: Defer to OT evaluation;RUE deficits/detail RUE Deficits / Details: Limited AROM shoulder elevation, PROM WFL. Decreased strength compared to LUE but functional. Able to grab cup and bottled water without difficulty.          Lower Extremity Assessment: Overall WFL for tasks assessed         Communication   Communication: No difficulties  Cognition Arousal/Alertness: Awake/alert Behavior During Therapy: WFL for tasks assessed/performed Overall Cognitive Status: Within Functional Limits for tasks assessed  General Comments General comments (skin integrity, edema, etc.): Pt reporting difficulty with vision - premorbid.    Exercises        Assessment/Plan    PT Assessment Patient needs continued PT services  PT Diagnosis Difficulty walking   PT Problem List Decreased  balance;Decreased mobility;Decreased range of motion;Cardiopulmonary status limiting activity;Decreased activity tolerance  PT Treatment Interventions Balance training;Gait training;Functional mobility training;Therapeutic activities;Therapeutic exercise;Patient/family education;DME instruction   PT Goals (Current goals can be found in the Care Plan section) Acute Rehab PT Goals Patient Stated Goal: "to get myself sorted out" PT Goal Formulation: With patient/family Time For Goal Achievement: 04/29/15 Potential to Achieve Goals: Good    Frequency Min 3X/week   Barriers to discharge        Co-evaluation               End of Session Equipment Utilized During Treatment: Gait belt Activity Tolerance: Patient tolerated treatment well Patient left: in bed;with call bell/phone within reach;with family/visitor present (sitting EOB.) Nurse Communication: Mobility status    Functional Assessment Tool Used: Clinical judgment Functional Limitation: Mobility: Walking and moving around Mobility: Walking and Moving Around Current Status [id number]): At least 1 percent but less than 20 percent impaired, limited or restricted Mobility: Walking and Moving Around Goal Status [phone]): At least 1 percent but less than 20 percent impaired, limited or restricted    Time: [id number] PT Time Calculation (min) (ACUTE ONLY): 20 min   Charges:   PT Evaluation $Initial PT Evaluation Tier I: 1 Procedure     PT G Codes:   PT G-Codes **NOT FOR INPATIENT CLASS** Functional Assessment Tool Used: Clinical judgment Functional Limitation: Mobility: Walking and moving around Mobility: Walking and Moving Around Current Status [id number]): At least 1 percent but less than 20 percent impaired, limited or restricted Mobility: Walking and Moving Around Goal Status [phone]): At least 1 percent but less than 20 percent impaired, limited or restricted    [place] 04/15/2015, 3:03 PM Wray Kearns, [place],  DPT [phone]

## 2015-04-15 NOTE — Progress Notes (Signed)
Echocardiogram 2D Echocardiogram has been performed.  Sherry Fitzpatrick 04/15/2015, 2:30 PM

## 2015-04-15 NOTE — Progress Notes (Signed)
PROGRESS NOTE  Sherry Fitzpatrick [id number] DOB: Jul 26, 1951 DOA: 04/14/2015 PCP: Berkley Harvey, NP  Assessment/Plan: TIA  - tele - q2h then q4h neuro checks - MRI brain/MRA head negative - Carotid duplex - ECHO - Continue plavix 75mg  once daily - Lipid panel: LDL 103- on lipitor 80 - Hemoglobin A1c  - PT/OT/SLP   Chest pain, resolved - Cycle troponins negative - Risk factor modification as above  - Continue plavix, high dose statin, BB - Outpatient evaluation unless troponins elevated - NTG prn  Hyperglycemia - A1c - Daily CBG and if > 140, add SSI  Hypertension/hyperlipidemia - Continue high dose statin - Continue home blood pressure medications: Norvasc, clonidine, HCTZ, metoprolol, and valsartan  Asthma, stable, continue flovent and albuterol prn   Code Status: full Family Communication: patient Disposition Plan:    Consultants:    Procedures:      HPI/Subjective: Feeling much better Moving arm above head  Objective: Filed Vitals:   04/15/15 0803  BP: 140/76  Pulse: 57  Temp: 97.5 F (36.4 C)  Resp: 18    Intake/Output Summary (Last 24 hours) at 04/15/15 0857 Last data filed at 04/15/15 0852  Gross per 24 hour  Intake    420 ml  Output    350 ml  Net     70 ml   Filed Weights   04/15/15 0400  Weight: 110.179 kg (242 lb 14.4 oz)    Exam:   General:  A+Ox3, NAD  Cardiovascular: rrr  Respiratory: clear  Abdomen: +BS, soft  Musculoskeletal: no edema   Data Reviewed: Basic Metabolic Panel:  Recent Labs Lab 04/14/15 1308  NA 140  K 3.6  CL 104  CO2 29  GLUCOSE 114*  BUN 15  CREATININE 0.95  CALCIUM 8.6*   Liver Function Tests:  Recent Labs Lab 04/14/15 1308  AST 22  ALT 16  ALKPHOS 81  BILITOT 0.4  PROT 7.7  ALBUMIN 3.7   No results for input(s): LIPASE, AMYLASE in the last 168 hours. No results for input(s): AMMONIA in the last 168 hours. CBC:  Recent Labs Lab 04/14/15 1308    WBC 5.8  NEUTROABS 2.5  HGB 12.3  HCT 36.9  MCV 88.3  PLT 294   Cardiac Enzymes:  Recent Labs Lab 04/14/15 1917 04/15/15 0104 04/15/15 0631  TROPONINI <0.03 <0.03 <0.03   BNP (last 3 results) No results for input(s): BNP in the last 8760 hours.  ProBNP (last 3 results) No results for input(s): PROBNP in the last 8760 hours.  CBG:  Recent Labs Lab 04/14/15 1315  GLUCAP 106*    No results found for this or any previous visit (from the past 240 hour(s)).   Studies: Dg Chest 2 View  04/14/2015   CLINICAL DATA:  Chest tightness.  Right-sided weakness.  Dizziness.  EXAM: CHEST  2 VIEW  COMPARISON:  10/30/2010  FINDINGS: Cardiac silhouette is borderline enlarged. No mediastinal or hilar masses or convincing adenopathy.  Clear lungs.  No pleural effusion or pneumothorax.  Bony thorax is demineralized but intact.  IMPRESSION: No acute cardiopulmonary disease.   Electronically Signed   By: Lajean Manes M.D.   On: 04/14/2015 15:49   Ct Head Wo Contrast  04/14/2015   CLINICAL DATA:  Pt reports mid chest pain radiating to right neck and right arm (describes right arm as "sore). Associated dizziness and nausea with pain-all symptoms started around 1000. Denies SOB/back pain/vomiting/back pain. Has had two strokes in the past (2004 and 2011).  Takes Plavix for this. Speaking full/clear sentences. RR even/unlabored. Has slight right sided mouth droop. Decreased strength with right hand grip (patient endorses pain and soreness on right arm but says she also feels weaker on the right side). Says last time she had a stroke she had right sided weakness for approximately 4 days but returned to baseline after this. Unable to determine if patient has absent or present drift d/t right arm pain. Able to stand and pivot to the bed. Decreased strength with right leg on neurological assessment. No other c/c.  EXAM: CT HEAD WITHOUT CONTRAST  TECHNIQUE: Contiguous axial images were obtained from the base of  the skull through the vertex without intravenous contrast.  COMPARISON:  06/07/2005  FINDINGS: The ventricles are normal in size and configuration. There are no parenchymal masses or mass effect. There is no evidence of a recent cortical infarct. Mild patchy white matter hypoattenuation is noted consistent with chronic microvascular ischemic change.  There are no extra-axial masses or abnormal fluid collections.  There is no intracranial hemorrhage.  No skull lesion. Visualized sinuses and mastoid air cells are clear.  IMPRESSION: 1. No acute intracranial abnormalities. These results were called by telephone at the time of interpretation on 04/14/2015 at 1:30 pm to Dr. Dorie Rank , who verbally acknowledged these results. 2. Mild chronic microvascular ischemic change.   Electronically Signed   By: Lajean Manes M.D.   On: 04/14/2015 13:31   Mr Jodene Nam Head Wo Contrast  04/14/2015   CLINICAL DATA:  Initial evaluation for acute right-sided pain with weakness.  EXAM: MRI HEAD WITHOUT CONTRAST  MRA HEAD WITHOUT CONTRAST  TECHNIQUE: Multiplanar, multiecho pulse sequences of the brain and surrounding structures were obtained without intravenous contrast. Angiographic images of the head were obtained using MRA technique without contrast.  COMPARISON:  Prior head CT from earlier the same day.  FINDINGS: MRI HEAD FINDINGS  The CSF containing spaces are within normal limits for patient age. Scattered patchy T2/FLAIR hyperintensity within the periventricular and deep white matter present, most consistent with chronic small vessel ischemic disease. No mass lesion, midline shift, or extra-axial fluid collection. Ventricles are normal in size without evidence of hydrocephalus. Probable small remote lacunar infarcts within the left basal ganglia.  No diffusion-weighted signal abnormality is identified to suggest acute intracranial infarct. Gray-white matter differentiation is maintained. Normal flow voids are seen within the  intracranial vasculature. No intracranial hemorrhage identified.  The cervicomedullary junction is normal. Pituitary gland is within normal limits. Pituitary stalk is midline. The globes and optic nerves demonstrate a normal appearance with normal signal intensity. The  The bone marrow signal intensity is normal. Calvarium is intact. Visualized upper cervical spine is within normal limits.  Scalp soft tissues are unremarkable.  Paranasal sinuses are clear.  No mastoid effusion.  MRA HEAD FINDINGS  ANTERIOR CIRCULATION:  Visualized distal cervical segments of the internal carotid arteries are patent with antegrade flow. The petrous segments are widely patent. Mild multi focal atheromatous irregularity present within the cavernous ICAs bilaterally without hemodynamically significant stenosis. Short-segment high-grade stenosis at the origin of the left A1 segment, with probable additional moderate to severe stenosis at the origin of the right A1 segment. Anterior communicating artery normal. Anterior cerebral arteries well opacified. Possible distal atheromatous irregularity within the A2 segments bilaterally, slightly greater on the right.  M1 segments widely patent without stenosis or occlusion. In MCA bifurcations within normal limits. Distal MCA branches well opacified bilaterally.  POSTERIOR CIRCULATION:  Vertebral arteries are  widely patent to the vertebrobasilar junction. Right vertebral artery is slightly dominant. Posterior inferior cerebral arteries patent. Basilar artery widely patent. Superior cerebellar arteries well opacified. Short-segment moderate stenosis within the left P1 segment. Mild to moderate multi focal atheromatous irregularity within the left PCA artery distally. Short segment mild stenosis within the proximal right P2 segment.  No aneurysm or vascular malformation.  IMPRESSION: MRI HEAD IMPRESSION:  1. No acute intracranial infarct or other process identified. 2. Mild to moderate chronic  small vessel ischemic disease. MRA HEAD IMPRESSION:  1. No proximal branch or large vessel occlusion identified. 2. Short-segment severe stenoses within the proximal A1 segments bilaterally. 3. Short-segment moderate to severe stenosis within the left P1 segment, with atheromatous irregularity within the left PCA distally. 4. Mild to moderate atheromatous irregularity within the proximal right P2 segment.   Electronically Signed   By: Jeannine Boga M.D.   On: 04/14/2015 23:25   Mr Brain Wo Contrast  04/14/2015   CLINICAL DATA:  Initial evaluation for acute right-sided pain with weakness.  EXAM: MRI HEAD WITHOUT CONTRAST  MRA HEAD WITHOUT CONTRAST  TECHNIQUE: Multiplanar, multiecho pulse sequences of the brain and surrounding structures were obtained without intravenous contrast. Angiographic images of the head were obtained using MRA technique without contrast.  COMPARISON:  Prior head CT from earlier the same day.  FINDINGS: MRI HEAD FINDINGS  The CSF containing spaces are within normal limits for patient age. Scattered patchy T2/FLAIR hyperintensity within the periventricular and deep white matter present, most consistent with chronic small vessel ischemic disease. No mass lesion, midline shift, or extra-axial fluid collection. Ventricles are normal in size without evidence of hydrocephalus. Probable small remote lacunar infarcts within the left basal ganglia.  No diffusion-weighted signal abnormality is identified to suggest acute intracranial infarct. Gray-white matter differentiation is maintained. Normal flow voids are seen within the intracranial vasculature. No intracranial hemorrhage identified.  The cervicomedullary junction is normal. Pituitary gland is within normal limits. Pituitary stalk is midline. The globes and optic nerves demonstrate a normal appearance with normal signal intensity. The  The bone marrow signal intensity is normal. Calvarium is intact. Visualized upper cervical spine is  within normal limits.  Scalp soft tissues are unremarkable.  Paranasal sinuses are clear.  No mastoid effusion.  MRA HEAD FINDINGS  ANTERIOR CIRCULATION:  Visualized distal cervical segments of the internal carotid arteries are patent with antegrade flow. The petrous segments are widely patent. Mild multi focal atheromatous irregularity present within the cavernous ICAs bilaterally without hemodynamically significant stenosis. Short-segment high-grade stenosis at the origin of the left A1 segment, with probable additional moderate to severe stenosis at the origin of the right A1 segment. Anterior communicating artery normal. Anterior cerebral arteries well opacified. Possible distal atheromatous irregularity within the A2 segments bilaterally, slightly greater on the right.  M1 segments widely patent without stenosis or occlusion. In MCA bifurcations within normal limits. Distal MCA branches well opacified bilaterally.  POSTERIOR CIRCULATION:  Vertebral arteries are widely patent to the vertebrobasilar junction. Right vertebral artery is slightly dominant. Posterior inferior cerebral arteries patent. Basilar artery widely patent. Superior cerebellar arteries well opacified. Short-segment moderate stenosis within the left P1 segment. Mild to moderate multi focal atheromatous irregularity within the left PCA artery distally. Short segment mild stenosis within the proximal right P2 segment.  No aneurysm or vascular malformation.  IMPRESSION: MRI HEAD IMPRESSION:  1. No acute intracranial infarct or other process identified. 2. Mild to moderate chronic small vessel ischemic disease. MRA HEAD IMPRESSION:  1. No proximal branch or large vessel occlusion identified. 2. Short-segment severe stenoses within the proximal A1 segments bilaterally. 3. Short-segment moderate to severe stenosis within the left P1 segment, with atheromatous irregularity within the left PCA distally. 4. Mild to moderate atheromatous irregularity  within the proximal right P2 segment.   Electronically Signed   By: Jeannine Boga M.D.   On: 04/14/2015 23:25    Scheduled Meds: .  stroke: mapping our early stages of recovery book   Does not apply Once  . amLODipine  10 mg Oral Daily  . atorvastatin  80 mg Oral q1800  . budesonide  0.25 mg Nebulization BID  . cholecalciferol  400 Units Oral Daily  . cloNIDine  0.3 mg Oral BID  . clopidogrel  75 mg Oral Daily  . enoxaparin (LOVENOX) injection  40 mg Subcutaneous Q24H  . famotidine  20 mg Oral BID  . hydrochlorothiazide  25 mg Oral Daily  . irbesartan  150 mg Oral Daily  . loratadine  10 mg Oral Daily  . metoprolol succinate  25 mg Oral Daily   Continuous Infusions:  Antibiotics Given (last 72 hours)    None      Active Problems:   Chest pain   Right sided weakness   Hyperlipidemia   Body mass index 40.0-44.9, adult   Essential hypertension   Blood glucose elevated   Asthma    Time spent: 25 min    Kraven Calk  [facility] Pager [phone] If 7PM-7AM, please contact night-coverage at [email or web address], password [email or web address] 04/15/2015, 8:57 AM

## 2015-04-15 NOTE — Progress Notes (Signed)
Carotid Duplex has been completed ASAP for discharge this PM.  Preliminary results.  Bilateral:  1-39% ICA stenosis.  Vertebral artery flow is antegrade.     Landry Mellow, RDMS, RVT 04/15/2015 5:38 PM

## 2015-04-16 DIAGNOSIS — Z6841 Body Mass Index (BMI) 40.0 and over, adult: Secondary | ICD-10-CM | POA: Diagnosis not present

## 2015-04-16 DIAGNOSIS — J4520 Mild intermittent asthma, uncomplicated: Secondary | ICD-10-CM

## 2015-04-16 DIAGNOSIS — R739 Hyperglycemia, unspecified: Secondary | ICD-10-CM | POA: Diagnosis not present

## 2015-04-16 DIAGNOSIS — R072 Precordial pain: Secondary | ICD-10-CM | POA: Diagnosis not present

## 2015-04-16 LAB — HEMOGLOBIN A1C
Hgb A1c MFr Bld: 6.8 % — ABNORMAL HIGH (ref 4.8–5.6)
MEAN PLASMA GLUCOSE: 148 mg/dL

## 2015-04-16 MED ORDER — NITROGLYCERIN 0.4 MG SL SUBL: 0.4000 mg | SUBLINGUAL_TABLET | SUBLINGUAL | Status: DC | PRN

## 2015-04-16 NOTE — Discharge Summary (Signed)
Physician Discharge Summary   Patient ID: Sherry Fitzpatrick MRN: [id number] DOB/AGE: 04/28/1951 64 y.o.  Admit date: 04/14/2015 Discharge date: 04/16/2015  Primary Care Physician:  Berkley Harvey, NP  Discharge Diagnoses:     TIA  . Chest pain . Hyperlipidemia . Essential hypertension . Blood glucose elevated . Asthma  Consults: None   Recommendations for Outpatient Follow-up:  Patient was recommended to continue Plavix 75 mg daily, Lipitor  Outpatient stress test if the symptoms of chest pain returned  Please follow hemoglobin A1c, and CBG readings at the time of follow-up, may need to be on oral hypoglycemics if elevated.  TESTS THAT NEED FOLLOW-UP None   DIET: Heart healthy diet    Allergies:   Allergies  Allergen Reactions  . Penicillins Hives  . Percocet [Oxycodone-Acetaminophen] Nausea And Vomiting     Discharge Medications:   Medication List    TAKE these medications        albuterol 108 (90 BASE) MCG/ACT inhaler  Commonly known as:  PROVENTIL HFA;VENTOLIN HFA  Inhale 2 puffs into the lungs every 6 (six) hours as needed for wheezing.     amLODipine 10 MG tablet  Commonly known as:  NORVASC  Take 10 mg by mouth daily.     atorvastatin 80 MG tablet  Commonly known as:  LIPITOR  Take 80 mg by mouth daily.     cholecalciferol 400 UNITS Tabs tablet  Commonly known as:  VITAMIN D  Take 400 Units by mouth daily.     cloNIDine 0.3 MG tablet  Commonly known as:  CATAPRES  Take 0.3 mg by mouth 2 (two) times daily.     clopidogrel 75 MG tablet  Commonly known as:  PLAVIX  Take 75 mg by mouth daily.     fluticasone 50 MCG/BLIST diskus inhaler  Commonly known as:  FLOVENT DISKUS  Inhale 1 puff into the lungs 2 (two) times daily.     hydrochlorothiazide 25 MG tablet  Commonly known as:  HYDRODIURIL  Take 25 mg by mouth daily.     loratadine 10 MG tablet  Commonly known as:  CLARITIN  Take 10 mg by mouth daily.     metoprolol succinate 25 MG 24  hr tablet  Commonly known as:  TOPROL-XL  Take 25 mg by mouth daily.     nitroGLYCERIN 0.4 MG SL tablet  Commonly known as:  NITROSTAT  Place 1 tablet (0.4 mg total) under the tongue every 5 (five) minutes as needed for chest pain (hold for SBP < 110).     ranitidine 150 MG capsule  Commonly known as:  ZANTAC  Take 150 mg by mouth 2 (two) times daily.     traMADol 50 MG tablet  Commonly known as:  ULTRAM  Take 50 mg by mouth every 6 (six) hours as needed for pain.     valsartan 160 MG tablet  Commonly known as:  DIOVAN  Take 160 mg by mouth daily.         Brief H and P: For complete details please refer to admission H and P, but in briefThe patient is a 64 y.o. year-old female with history of previous stroke, hypertension, hyperlipidemia, borderline diabetes, GERD, hx of pituitary tumor excision who presented with chest pain and right sided pain and weakness. The patient was last at their baseline health two days ago. The day prior to admission she felt fatigued. She states she woke up on the day of admission 4 AM and felt  okay. She was able to fix breakfast without problem and drove to her daughter's house to babysit her granddaughter. She denied any focal numbness, tingling, weakness, confusion, slurred speech at that time. She also did not have any shortness of breath or chest pain. Around 9 AM she suddenly developed a bitemporal headache, mild fatigue, and felt very heavy all over. About 30 minutes later, she developed moderate substernal chest pressure which radiated across her entire chest but not to her back. Her chest pain waxed and waned. She is not sure how long the episodes lasted for but not more than several minutes. She had some associated nausea and shortness of breath. She simultaneously developed some weakness of the right upper and right lower extremities with some possible numbness of the right hand. She denied blurred vision, confusion, slurred speech. She was able to  call her daughter who came to pick her up and bring her to the hospital. In the ER, her VS were notable for mild sinus bradycardia and mildly elevated blood pressures. Labs notable for mildly low calcium and mildly elevated glucose. UA negative. Head CT without acute abnormality but incidentally saw mild chronic microvascular ischemic change. The patient was admitted for further workup.   Hospital Course:   TIA With symptoms of right-sided pain and weakness resolved  Patient was admitted to telemetry for full stroke workup. MRI of the brain and MRA negative for acute CVA. A 2-D echo showed EF of 60-65%, mild aortic stenosis, grade 2 diastolic dysfunction.   Carotid Doppler showed 1-39% ICA stenosis. Patient was continued on Plavix 75 mg daily. Lipid panel showed LDL 103, placed on Lipitor 80 mg daily.  PT OT evaluation was done and recommended no PT follow-up, patient is currently back to her baseline.  Hemoglobin A1c 6.8. Patient was counseled strong carb modified diet.    Chest pain, resolved -  serial cardiac enzymes negative, continue Plavix, statin and beta blockers. Recommend outpatient stress test if patient reports recurrent symptoms.  Hyperperglycemia  Hemoglobin A1c 6.8, please follow closely, patient may need to be on oral hypoglycemic  Hypertension/hyperlipidemia - Continue high dose statin - Continue home blood pressure medications: Norvasc, clonidine, HCTZ, metoprolol, and valsartan  Asthma, stable, continue flovent and albuterol prn  Day of Discharge BP 145/73 mmHg  Pulse 65  Temp(Src) 98.4 F (36.9 C) (Oral)  Resp 20  Ht 5\' 4"  (1.626 m)  Wt 111.041 kg (244 lb 12.8 oz)  BMI 42.00 kg/m2  SpO2 100%  Physical Exam: General: Alert and awake oriented x3 not in any acute distress. HEENT: anicteric sclera, pupils reactive to light and accommodation CVS: S1-S2 clear no murmur rubs or gallops Chest: clear to auscultation bilaterally, no wheezing rales or  rhonchi Abdomen: soft nontender, nondistended, normal bowel sounds Extremities: no cyanosis, clubbing or edema noted bilaterally Neuro: Cranial nerves II-XII intact, no focal neurological deficits   The results of significant diagnostics from this hospitalization (including imaging, microbiology, ancillary and laboratory) are listed below for reference.    LAB RESULTS: Basic Metabolic Panel:  Recent Labs Lab 04/14/15 1308  NA 140  K 3.6  CL 104  CO2 29  GLUCOSE 114*  BUN 15  CREATININE 0.95  CALCIUM 8.6*   Liver Function Tests:  Recent Labs Lab 04/14/15 1308  AST 22  ALT 16  ALKPHOS 81  BILITOT 0.4  PROT 7.7  ALBUMIN 3.7   No results for input(s): LIPASE, AMYLASE in the last 168 hours. No results for input(s): AMMONIA in the  last 168 hours. CBC:  Recent Labs Lab 04/14/15 1308  WBC 5.8  NEUTROABS 2.5  HGB 12.3  HCT 36.9  MCV 88.3  PLT 294   Cardiac Enzymes:  Recent Labs Lab 04/15/15 0104 04/15/15 0631  TROPONINI <0.03 <0.03   BNP: Invalid input(s): POCBNP CBG:  Recent Labs Lab 04/14/15 1315  GLUCAP 106*    Significant Diagnostic Studies:  Dg Chest 2 View  04/14/2015   CLINICAL DATA:  Chest tightness.  Right-sided weakness.  Dizziness.  EXAM: CHEST  2 VIEW  COMPARISON:  10/30/2010  FINDINGS: Cardiac silhouette is borderline enlarged. No mediastinal or hilar masses or convincing adenopathy.  Clear lungs.  No pleural effusion or pneumothorax.  Bony thorax is demineralized but intact.  IMPRESSION: No acute cardiopulmonary disease.   Electronically Signed   By: Lajean Manes M.D.   On: 04/14/2015 15:49   Ct Head Wo Contrast  04/14/2015   CLINICAL DATA:  Pt reports mid chest pain radiating to right neck and right arm (describes right arm as "sore). Associated dizziness and nausea with pain-all symptoms started around 1000. Denies SOB/back pain/vomiting/back pain. Has had two strokes in the past (2004 and 2011). Takes Plavix for this. Speaking  full/clear sentences. RR even/unlabored. Has slight right sided mouth droop. Decreased strength with right hand grip (patient endorses pain and soreness on right arm but says she also feels weaker on the right side). Says last time she had a stroke she had right sided weakness for approximately 4 days but returned to baseline after this. Unable to determine if patient has absent or present drift d/t right arm pain. Able to stand and pivot to the bed. Decreased strength with right leg on neurological assessment. No other c/c.  EXAM: CT HEAD WITHOUT CONTRAST  TECHNIQUE: Contiguous axial images were obtained from the base of the skull through the vertex without intravenous contrast.  COMPARISON:  06/07/2005  FINDINGS: The ventricles are normal in size and configuration. There are no parenchymal masses or mass effect. There is no evidence of a recent cortical infarct. Mild patchy white matter hypoattenuation is noted consistent with chronic microvascular ischemic change.  There are no extra-axial masses or abnormal fluid collections.  There is no intracranial hemorrhage.  No skull lesion. Visualized sinuses and mastoid air cells are clear.  IMPRESSION: 1. No acute intracranial abnormalities. These results were called by telephone at the time of interpretation on 04/14/2015 at 1:30 pm to Dr. Dorie Rank , who verbally acknowledged these results. 2. Mild chronic microvascular ischemic change.   Electronically Signed   By: Lajean Manes M.D.   On: 04/14/2015 13:31   Mr Jodene Nam Head Wo Contrast  04/14/2015   CLINICAL DATA:  Initial evaluation for acute right-sided pain with weakness.  EXAM: MRI HEAD WITHOUT CONTRAST  MRA HEAD WITHOUT CONTRAST  TECHNIQUE: Multiplanar, multiecho pulse sequences of the brain and surrounding structures were obtained without intravenous contrast. Angiographic images of the head were obtained using MRA technique without contrast.  COMPARISON:  Prior head CT from earlier the same day.  FINDINGS: MRI  HEAD FINDINGS  The CSF containing spaces are within normal limits for patient age. Scattered patchy T2/FLAIR hyperintensity within the periventricular and deep white matter present, most consistent with chronic small vessel ischemic disease. No mass lesion, midline shift, or extra-axial fluid collection. Ventricles are normal in size without evidence of hydrocephalus. Probable small remote lacunar infarcts within the left basal ganglia.  No diffusion-weighted signal abnormality is identified to suggest acute intracranial infarct.  Gray-white matter differentiation is maintained. Normal flow voids are seen within the intracranial vasculature. No intracranial hemorrhage identified.  The cervicomedullary junction is normal. Pituitary gland is within normal limits. Pituitary stalk is midline. The globes and optic nerves demonstrate a normal appearance with normal signal intensity. The  The bone marrow signal intensity is normal. Calvarium is intact. Visualized upper cervical spine is within normal limits.  Scalp soft tissues are unremarkable.  Paranasal sinuses are clear.  No mastoid effusion.  MRA HEAD FINDINGS  ANTERIOR CIRCULATION:  Visualized distal cervical segments of the internal carotid arteries are patent with antegrade flow. The petrous segments are widely patent. Mild multi focal atheromatous irregularity present within the cavernous ICAs bilaterally without hemodynamically significant stenosis. Short-segment high-grade stenosis at the origin of the left A1 segment, with probable additional moderate to severe stenosis at the origin of the right A1 segment. Anterior communicating artery normal. Anterior cerebral arteries well opacified. Possible distal atheromatous irregularity within the A2 segments bilaterally, slightly greater on the right.  M1 segments widely patent without stenosis or occlusion. In MCA bifurcations within normal limits. Distal MCA branches well opacified bilaterally.  POSTERIOR  CIRCULATION:  Vertebral arteries are widely patent to the vertebrobasilar junction. Right vertebral artery is slightly dominant. Posterior inferior cerebral arteries patent. Basilar artery widely patent. Superior cerebellar arteries well opacified. Short-segment moderate stenosis within the left P1 segment. Mild to moderate multi focal atheromatous irregularity within the left PCA artery distally. Short segment mild stenosis within the proximal right P2 segment.  No aneurysm or vascular malformation.  IMPRESSION: MRI HEAD IMPRESSION:  1. No acute intracranial infarct or other process identified. 2. Mild to moderate chronic small vessel ischemic disease. MRA HEAD IMPRESSION:  1. No proximal branch or large vessel occlusion identified. 2. Short-segment severe stenoses within the proximal A1 segments bilaterally. 3. Short-segment moderate to severe stenosis within the left P1 segment, with atheromatous irregularity within the left PCA distally. 4. Mild to moderate atheromatous irregularity within the proximal right P2 segment.   Electronically Signed   By: Jeannine Boga M.D.   On: 04/14/2015 23:25   Mr Brain Wo Contrast  04/14/2015   CLINICAL DATA:  Initial evaluation for acute right-sided pain with weakness.  EXAM: MRI HEAD WITHOUT CONTRAST  MRA HEAD WITHOUT CONTRAST  TECHNIQUE: Multiplanar, multiecho pulse sequences of the brain and surrounding structures were obtained without intravenous contrast. Angiographic images of the head were obtained using MRA technique without contrast.  COMPARISON:  Prior head CT from earlier the same day.  FINDINGS: MRI HEAD FINDINGS  The CSF containing spaces are within normal limits for patient age. Scattered patchy T2/FLAIR hyperintensity within the periventricular and deep white matter present, most consistent with chronic small vessel ischemic disease. No mass lesion, midline shift, or extra-axial fluid collection. Ventricles are normal in size without evidence of  hydrocephalus. Probable small remote lacunar infarcts within the left basal ganglia.  No diffusion-weighted signal abnormality is identified to suggest acute intracranial infarct. Gray-white matter differentiation is maintained. Normal flow voids are seen within the intracranial vasculature. No intracranial hemorrhage identified.  The cervicomedullary junction is normal. Pituitary gland is within normal limits. Pituitary stalk is midline. The globes and optic nerves demonstrate a normal appearance with normal signal intensity. The  The bone marrow signal intensity is normal. Calvarium is intact. Visualized upper cervical spine is within normal limits.  Scalp soft tissues are unremarkable.  Paranasal sinuses are clear.  No mastoid effusion.  MRA HEAD FINDINGS  ANTERIOR CIRCULATION:  Visualized distal cervical segments of the internal carotid arteries are patent with antegrade flow. The petrous segments are widely patent. Mild multi focal atheromatous irregularity present within the cavernous ICAs bilaterally without hemodynamically significant stenosis. Short-segment high-grade stenosis at the origin of the left A1 segment, with probable additional moderate to severe stenosis at the origin of the right A1 segment. Anterior communicating artery normal. Anterior cerebral arteries well opacified. Possible distal atheromatous irregularity within the A2 segments bilaterally, slightly greater on the right.  M1 segments widely patent without stenosis or occlusion. In MCA bifurcations within normal limits. Distal MCA branches well opacified bilaterally.  POSTERIOR CIRCULATION:  Vertebral arteries are widely patent to the vertebrobasilar junction. Right vertebral artery is slightly dominant. Posterior inferior cerebral arteries patent. Basilar artery widely patent. Superior cerebellar arteries well opacified. Short-segment moderate stenosis within the left P1 segment. Mild to moderate multi focal atheromatous irregularity  within the left PCA artery distally. Short segment mild stenosis within the proximal right P2 segment.  No aneurysm or vascular malformation.  IMPRESSION: MRI HEAD IMPRESSION:  1. No acute intracranial infarct or other process identified. 2. Mild to moderate chronic small vessel ischemic disease. MRA HEAD IMPRESSION:  1. No proximal branch or large vessel occlusion identified. 2. Short-segment severe stenoses within the proximal A1 segments bilaterally. 3. Short-segment moderate to severe stenosis within the left P1 segment, with atheromatous irregularity within the left PCA distally. 4. Mild to moderate atheromatous irregularity within the proximal right P2 segment.   Electronically Signed   By: Jeannine Boga M.D.   On: 04/14/2015 23:25    2D ECHO:  Study Conclusions  - Left ventricle: The cavity size was normal. Wall thickness was increased in a pattern of mild LVH. Systolic function was normal. The estimated ejection fraction was in the range of 60% to 65%. Wall motion was normal; there were no regional wall motion abnormalities. Features are consistent with a pseudonormal left ventricular filling pattern, with concomitant abnormal relaxation and increased filling pressure (grade 2 diastolic dysfunction). - Aortic valve: There was mild stenosis. Mean gradient (S): 12 mm Hg. Valve area (VTI): 1.84 cm^2. - Mitral valve: There was no significant regurgitation. - Right ventricle: The cavity size was normal. Systolic function was normal. - Tricuspid valve: Peak RV-RA gradient (S): 23 mm Hg. - Systemic veins: IVC was not visualized.  Disposition and Follow-up:     Discharge Instructions    Diet - low sodium heart healthy    Complete by:  As directed      Increase activity slowly    Complete by:  As directed             DISPOSITION: home    DISCHARGE FOLLOW-UP Follow-up Information    Follow up with Berkley Harvey, NP. Schedule an appointment as soon as  possible for a visit in 10 days.   Specialty:  Nurse Practitioner   Why:  for hospital follow-up   Contact information:   [id number] [place] [address] [phone]        Time spent on Discharge: 35 mins   Signed:   Madyx Delfin M.D. [facility] 04/16/2015, 1:12 PM Pager: [phone]

## 2016-02-09 ENCOUNTER — Encounter (HOSPITAL_COMMUNITY): Payer: Self-pay | Admitting: *Deleted

## 2016-02-09 ENCOUNTER — Emergency Department (HOSPITAL_COMMUNITY)
Admission: EM | Admit: 2016-02-09 | Discharge: 2016-02-09 | Discharge status: Against Medical Advice | Payer: Medicare HMO | Attending: Emergency Medicine | Admitting: Emergency Medicine

## 2016-02-09 ENCOUNTER — Emergency Department (HOSPITAL_COMMUNITY): Payer: Medicare HMO

## 2016-02-09 DIAGNOSIS — E785 Hyperlipidemia, unspecified: Secondary | ICD-10-CM | POA: Insufficient documentation

## 2016-02-09 DIAGNOSIS — M1990 Unspecified osteoarthritis, unspecified site: Secondary | ICD-10-CM | POA: Insufficient documentation

## 2016-02-09 DIAGNOSIS — Z880 Allergy status to penicillin: Secondary | ICD-10-CM | POA: Insufficient documentation

## 2016-02-09 DIAGNOSIS — Z7902 Long term (current) use of antithrombotics/antiplatelets: Secondary | ICD-10-CM | POA: Diagnosis not present

## 2016-02-09 DIAGNOSIS — Z8669 Personal history of other diseases of the nervous system and sense organs: Secondary | ICD-10-CM | POA: Insufficient documentation

## 2016-02-09 DIAGNOSIS — K219 Gastro-esophageal reflux disease without esophagitis: Secondary | ICD-10-CM | POA: Insufficient documentation

## 2016-02-09 DIAGNOSIS — Z7951 Long term (current) use of inhaled steroids: Secondary | ICD-10-CM | POA: Diagnosis not present

## 2016-02-09 DIAGNOSIS — Z79899 Other long term (current) drug therapy: Secondary | ICD-10-CM | POA: Insufficient documentation

## 2016-02-09 DIAGNOSIS — R011 Cardiac murmur, unspecified: Secondary | ICD-10-CM | POA: Diagnosis not present

## 2016-02-09 DIAGNOSIS — Z8673 Personal history of transient ischemic attack (TIA), and cerebral infarction without residual deficits: Secondary | ICD-10-CM | POA: Insufficient documentation

## 2016-02-09 DIAGNOSIS — R51 Headache: Secondary | ICD-10-CM | POA: Diagnosis not present

## 2016-02-09 DIAGNOSIS — I10 Essential (primary) hypertension: Secondary | ICD-10-CM | POA: Insufficient documentation

## 2016-02-09 DIAGNOSIS — I639 Cerebral infarction, unspecified: Secondary | ICD-10-CM | POA: Insufficient documentation

## 2016-02-09 LAB — BASIC METABOLIC PANEL
Anion gap: 11 (ref 5–15)
BUN: 13 mg/dL (ref 6–20)
CO2: 25 mmol/L (ref 22–32)
Calcium: 9.5 mg/dL (ref 8.9–10.3)
Chloride: 107 mmol/L (ref 101–111)
Creatinine, Ser: 0.87 mg/dL (ref 0.44–1.00)
GLUCOSE: 142 mg/dL — AB (ref 65–99)
Potassium: 3.7 mmol/L (ref 3.5–5.1)
SODIUM: 143 mmol/L (ref 135–145)

## 2016-02-09 LAB — CBC
HEMATOCRIT: 35.5 % — AB (ref 36.0–46.0)
Hemoglobin: 11.5 g/dL — ABNORMAL LOW (ref 12.0–15.0)
MCH: 27.4 pg (ref 26.0–34.0)
MCHC: 32.4 g/dL (ref 30.0–36.0)
MCV: 84.5 fL (ref 78.0–100.0)
Platelets: 349 10*3/uL (ref 150–400)
RBC: 4.2 MIL/uL (ref 3.87–5.11)
RDW: 14.8 % (ref 11.5–15.5)
WBC: 7.1 10*3/uL (ref 4.0–10.5)

## 2016-02-09 LAB — I-STAT TROPONIN, ED: Troponin i, poc: 0.01 ng/mL (ref 0.00–0.08)

## 2016-02-09 MED ORDER — ACETAMINOPHEN 500 MG PO TABS: 1000.0000 mg | ORAL_TABLET | Freq: Once | ORAL | Status: DC

## 2016-02-09 NOTE — ED Notes (Addendum)
Pt states that she has not been taking her BP medications as prescribed. States that she took 2 but not the 7 she was suppose to take. Pt reports rt arm pain as well.

## 2016-02-09 NOTE — ED Notes (Signed)
This RN walked in to check on the patient and check on her pain, patient stated she was leaving right now and was not waiting for the doctor to discharge her and said that she was not taking tylenol here that she would take it at home, instructed to patient that she would be leaving against medical advice since her blood pressure was still elevated the patient stated that she understood and that she was not signing anything and walked out, er md notified

## 2016-02-09 NOTE — ED Provider Notes (Signed)
CSN: [id number]     Arrival date & time 02/09/16  1502 History   First MD Initiated Contact with Patient 02/09/16 1718     Chief Complaint  Patient presents with  . Hypertension     (Consider location/radiation/quality/duration/timing/severity/associated sxs/prior Treatment) HPI Comments: Patient presents with elevated blood pressure. She has a history of hypertension, prior TIAs, hyperlipidemia, borderline diabetes. She states that she hasn't been able to see her PCP recently and has been out of her blood pressure medications for a while. She takes 5 antihypertensive medications. She went to see her PCP yesterday. She's had a several-day history of a cough which is productive of white sputum. She's also had a bifrontal-type headache. Headaches been going on about 4-5 days. She's had similar type headaches in the past. She states today her headache is little bit better than it has been. She went to see her PCP yesterday who did a chest x-ray which the patient was told to be normal. She is diagnosed with bronchitis and started on a Z-Pak. She was given prescriptions for all her medications including her antihypertensives and her Plavix. She is not yet started taking them although she did start the antibiotic. She states she has filled them and is planning on taking her medications when she gets home today. She went back to see her PCP today. Her blood pressure was noted to be over 200 and she was sent over here for further evaluation. Patient denies any chest pain or shortness of breath. She does have a bifrontal-type headache. She denies any nausea or vomiting. No vision changes. No numbness or weakness in her extremities. She does report that her right leg is slightly weaker than the left although she attributes this to her knee and hip pain which is chronic for her. She states she feels like her knee and hip pain have been worsening over last 6 weeks or so when she's noticed that she's having trouble  moving the leg due to this. This is been going on per patient at least 6 weeks. There is no new changes in the last several days. She did have some pain as well in the right side of her neck going down her right arm. This was yesterday and she doesn't have any pain here today. She denies any numbness or weakness to her arms. No vision changes or speech deficits.   Past Medical History  Diagnosis Date  . Other and unspecified hyperlipidemia   . Unspecified visual disturbance   . Generalized pain   . Alterations of sensations, late effect of cerebrovascular disease   . Carpal tunnel syndrome   . Other postprocedural status(V45.89)   . Unspecified disorder of the pituitary gland and its hypothalamic control   . Backache, unspecified   . Esophageal reflux   . Body mass index 40.0-44.9, adult ([place])   . Unspecified essential hypertension   . Other symptoms involving cardiovascular system   . Other malaise and fatigue   . Other abnormal glucose   . Dizziness and giddiness   . Heart murmur   . Sleep apnea     "did test; they wanted me to get a CPAP but I never did get it" (04/14/2015  . Headache     "had alot til I had pituitary tumor removed"  . Stroke ([place]) 06/2003; 02/2010    "minor; minor" ; denies residual on 04/14/2015  . Arthritis     "all over"   Past Surgical History  Procedure Laterality Date  .  Transphenoidal / transnasal hypophysectomy / resection pituitary tumor  06/13/2012  . Total hip arthroplasty Right 11/06/2010  . Carpal tunnel release Left ~ 2014  . Joint replacement    . Tubal ligation  1974   Family History  Problem Relation Age of Onset  . Hypertension Sister   . Heart failure Sister   . Hypertension Daughter   . Heart failure Mother   . Hypertension Mother   . Diabetes Father    Social History  Substance Use Topics  . Smoking status: Never Smoker   . Smokeless tobacco: Never Used  . Alcohol Use: 0.0 oz/week    0 Standard drinks or equivalent per week      Comment: 04/14/2015 "might have a glass of wine q 5-6 months, if that"   OB History    No data available     Review of Systems  Constitutional: Negative for fever, chills, diaphoresis and fatigue.  HENT: Negative for congestion, rhinorrhea and sneezing.   Eyes: Negative.   Respiratory: Negative for cough, chest tightness and shortness of breath.   Cardiovascular: Negative for chest pain and leg swelling.  Gastrointestinal: Negative for nausea, vomiting, abdominal pain, diarrhea and blood in stool.  Genitourinary: Negative for frequency, hematuria, flank pain and difficulty urinating.  Musculoskeletal: Positive for arthralgias. Negative for back pain.  Skin: Negative for rash.  Neurological: Positive for light-headedness (Occasional lightheadedness on standing) and headaches. Negative for dizziness, speech difficulty, weakness and numbness.      Allergies  Penicillins and Percocet  Home Medications   Prior to Admission medications   Medication Sig Start Date End Date Taking? Authorizing Provider  albuterol (PROVENTIL HFA;VENTOLIN HFA) 108 (90 BASE) MCG/ACT inhaler Inhale 2 puffs into the lungs every 6 (six) hours as needed for wheezing.   Yes Historical Provider, MD  amLODipine (NORVASC) 10 MG tablet Take 10 mg by mouth daily.   Yes Historical Provider, MD  atorvastatin (LIPITOR) 80 MG tablet Take 80 mg by mouth daily.   Yes Historical Provider, MD  cholecalciferol (VITAMIN D) 400 UNITS TABS Take 400 Units by mouth daily.   Yes Historical Provider, MD  cloNIDine (CATAPRES) 0.3 MG tablet Take 0.3 mg by mouth 2 (two) times daily.   Yes Historical Provider, MD  clopidogrel (PLAVIX) 75 MG tablet Take 75 mg by mouth daily.   Yes Historical Provider, MD  fluticasone (FLOVENT DISKUS) 50 MCG/BLIST diskus inhaler Inhale 1 puff into the lungs 2 (two) times daily.   Yes Historical Provider, MD  hydrochlorothiazide (HYDRODIURIL) 25 MG tablet Take 25 mg by mouth daily.   Yes Historical Provider,  MD  loratadine (CLARITIN) 10 MG tablet Take 10 mg by mouth daily.   Yes Historical Provider, MD  methocarbamol (ROBAXIN) 500 MG tablet Take 500 mg by mouth every 6 (six) hours as needed for muscle spasms.   Yes Historical Provider, MD  metoprolol succinate (TOPROL-XL) 25 MG 24 hr tablet Take 25 mg by mouth daily.   Yes Historical Provider, MD  nitroGLYCERIN (NITROSTAT) 0.4 MG SL tablet Place 1 tablet (0.4 mg total) under the tongue every 5 (five) minutes as needed for chest pain (hold for SBP < 110). 04/16/15  Yes Ripudeep K Rai, MD  potassium chloride (K-DUR,KLOR-CON) 10 MEQ tablet Take 10 mEq by mouth daily.   Yes Historical Provider, MD  ranitidine (ZANTAC) 150 MG capsule Take 150 mg by mouth 2 (two) times daily.   Yes Historical Provider, MD  valsartan (DIOVAN) 160 MG tablet Take 160 mg by  mouth daily.   Yes Historical Provider, MD  traMADol (ULTRAM) 50 MG tablet Take 50 mg by mouth every 6 (six) hours as needed for pain. Reported on 02/09/2016    Historical Provider, MD   BP 194/86 mmHg  Pulse 84  Temp(Src) 98.1 F (36.7 C) (Oral)  Resp 20  SpO2 100% Physical Exam  Constitutional: She is oriented to person, place, and time. She appears well-developed and well-nourished.  HENT:  Head: Normocephalic and atraumatic.  Eyes: Pupils are equal, round, and reactive to light.  Neck: Normal range of motion. Neck supple.  Cardiovascular: Normal rate, regular rhythm and normal heart sounds.   Pulmonary/Chest: Effort normal and breath sounds normal. No respiratory distress. She has no wheezes. She has no rales. She exhibits no tenderness.  Abdominal: Soft. Bowel sounds are normal. There is no tenderness. There is no rebound and no guarding.  Musculoskeletal: Normal range of motion. She exhibits no edema.  Lymphadenopathy:    She has no cervical adenopathy.  Neurological: She is alert and oriented to person, place, and time. She has normal strength. No cranial nerve deficit or sensory deficit. GCS  eye subscore is 4. GCS verbal subscore is 5. GCS motor subscore is 6.  Finger to nose intact, no pronator drift  Skin: Skin is warm and dry. No rash noted.  Psychiatric: She has a normal mood and affect.    ED Course  Procedures (including critical care time) Labs Review Labs Reviewed  BASIC METABOLIC PANEL - Abnormal; Notable for the following:    Glucose, Bld 142 (*)    All other components within normal limits  CBC - Abnormal; Notable for the following:    Hemoglobin 11.5 (*)    HCT 35.5 (*)    All other components within normal limits  Randolm Idol, ED    Imaging Review Dg Chest 2 View  02/09/2016  CLINICAL DATA:  Shortness of breath. Arm pain. Non compliance with medication. EXAM: CHEST  2 VIEW COMPARISON:  02/08/2016 FINDINGS: Mild enlargement of the cardiopericardial silhouette with upper zone pulmonary vascular indistinctness. No overt edema. Thoracic spondylosis.  No pleural effusion. IMPRESSION: 1. Mild enlargement of the cardiopericardial silhouette with indistinct pulmonary vasculature suggesting pulmonary venous hypertension, but without overt edema. Electronically Signed   By: Van Clines M.D.   On: 02/09/2016 15:50   I have personally reviewed and evaluated these images and lab results as part of my medical decision-making.   EKG Interpretation   Date/Time:  Thursday February 09 2016 15:12:55 EDT Ventricular Rate:  89 PR Interval:  142 QRS Duration: 90 QT Interval:  384 QTC Calculation: 467 R Axis:   73 Text Interpretation:   Poor data quality, interpretation may be  adversely affected Normal sinus rhythm ST \\T \ T wave abnormality, consider  inferolateral ischemia Abnormal ECG similar to EKG from 04/14/15 Confirmed  by Chantrice Hagg  MD, Keshaun Dubey ([id number]) on 02/09/2016 6:02:41 PM      MDM   Final diagnoses:  Essential hypertension    Patient presents with elevated blood pressure and headache. Her blood pressure is starting to improve. On my exam, her blood  pressure was 188/70.  She doesn't have any neurologic deficits or suggestions of a CVA/TIA. Her chest x-ray doesn't show evidence of pulmonary edema or pneumonia. She was given dose of Tylenol for her headache. I advised her that we will monitor her blood pressure for a bit longer. However patient eloped from her room prior to re-evaluation.    Malvin Johns, MD  02/09/16 1858 

## 2016-03-26 DIAGNOSIS — Z202 Contact with and (suspected) exposure to infections with a predominantly sexual mode of transmission: Secondary | ICD-10-CM | POA: Diagnosis not present

## 2016-03-26 DIAGNOSIS — N949 Unspecified condition associated with female genital organs and menstrual cycle: Secondary | ICD-10-CM | POA: Diagnosis not present

## 2016-03-26 DIAGNOSIS — Z23 Encounter for immunization: Secondary | ICD-10-CM | POA: Diagnosis not present

## 2016-03-26 DIAGNOSIS — G459 Transient cerebral ischemic attack, unspecified: Secondary | ICD-10-CM | POA: Diagnosis not present

## 2016-03-26 DIAGNOSIS — J069 Acute upper respiratory infection, unspecified: Secondary | ICD-10-CM | POA: Diagnosis not present

## 2016-03-26 DIAGNOSIS — I10 Essential (primary) hypertension: Secondary | ICD-10-CM | POA: Diagnosis not present

## 2016-04-23 DIAGNOSIS — Z1231 Encounter for screening mammogram for malignant neoplasm of breast: Secondary | ICD-10-CM | POA: Diagnosis not present

## 2016-04-23 DIAGNOSIS — Z1239 Encounter for other screening for malignant neoplasm of breast: Secondary | ICD-10-CM | POA: Diagnosis not present

## 2016-04-30 ENCOUNTER — Emergency Department (HOSPITAL_COMMUNITY): Payer: Commercial Managed Care - HMO

## 2016-04-30 ENCOUNTER — Encounter (HOSPITAL_COMMUNITY): Payer: Self-pay

## 2016-04-30 ENCOUNTER — Emergency Department (HOSPITAL_COMMUNITY)
Admission: EM | Admit: 2016-04-30 | Discharge: 2016-04-30 | Disposition: A | Discharge status: Home / Self Care | Payer: Commercial Managed Care - HMO | Attending: Emergency Medicine | Admitting: Emergency Medicine

## 2016-04-30 DIAGNOSIS — R51 Headache: Secondary | ICD-10-CM

## 2016-04-30 DIAGNOSIS — R200 Anesthesia of skin: Secondary | ICD-10-CM | POA: Insufficient documentation

## 2016-04-30 DIAGNOSIS — Z8673 Personal history of transient ischemic attack (TIA), and cerebral infarction without residual deficits: Secondary | ICD-10-CM | POA: Diagnosis not present

## 2016-04-30 DIAGNOSIS — Z79899 Other long term (current) drug therapy: Secondary | ICD-10-CM | POA: Insufficient documentation

## 2016-04-30 DIAGNOSIS — R112 Nausea with vomiting, unspecified: Secondary | ICD-10-CM | POA: Insufficient documentation

## 2016-04-30 DIAGNOSIS — E785 Hyperlipidemia, unspecified: Secondary | ICD-10-CM | POA: Diagnosis not present

## 2016-04-30 DIAGNOSIS — Z7951 Long term (current) use of inhaled steroids: Secondary | ICD-10-CM | POA: Insufficient documentation

## 2016-04-30 DIAGNOSIS — I10 Essential (primary) hypertension: Secondary | ICD-10-CM | POA: Diagnosis not present

## 2016-04-30 DIAGNOSIS — M1990 Unspecified osteoarthritis, unspecified site: Secondary | ICD-10-CM | POA: Diagnosis not present

## 2016-04-30 DIAGNOSIS — R519 Headache, unspecified: Secondary | ICD-10-CM

## 2016-04-30 LAB — CBC WITH DIFFERENTIAL/PLATELET
Basophils Absolute: 0 10*3/uL (ref 0.0–0.1)
Basophils Relative: 0 %
Eosinophils Absolute: 0.4 10*3/uL (ref 0.0–0.7)
Eosinophils Relative: 5 %
HCT: 38.2 % (ref 36.0–46.0)
Hemoglobin: 12.9 g/dL (ref 12.0–15.0)
Lymphocytes Relative: 43 %
Lymphs Abs: 3.6 10*3/uL (ref 0.7–4.0)
MCH: 27.6 pg (ref 26.0–34.0)
MCHC: 33.8 g/dL (ref 30.0–36.0)
MCV: 81.8 fL (ref 78.0–100.0)
Monocytes Absolute: 0.8 10*3/uL (ref 0.1–1.0)
Monocytes Relative: 9 %
Neutro Abs: 3.6 10*3/uL (ref 1.7–7.7)
Neutrophils Relative %: 43 %
Platelets: 347 10*3/uL (ref 150–400)
RBC: 4.67 MIL/uL (ref 3.87–5.11)
RDW: 16.9 % — ABNORMAL HIGH (ref 11.5–15.5)
WBC: 8.5 10*3/uL (ref 4.0–10.5)

## 2016-04-30 LAB — BASIC METABOLIC PANEL
Anion gap: 7 (ref 5–15)
BUN: 14 mg/dL (ref 6–20)
CO2: 27 mmol/L (ref 22–32)
Calcium: 8.9 mg/dL (ref 8.9–10.3)
Chloride: 107 mmol/L (ref 101–111)
Creatinine, Ser: 0.8 mg/dL (ref 0.44–1.00)
GFR calc Af Amer: >60 mL/min (ref >60)
GFR calc non Af Amer: >60 mL/min (ref >60)
Glucose, Bld: 101 mg/dL — ABNORMAL HIGH (ref 65–99)
Potassium: 3.6 mmol/L (ref 3.5–5.1)
Sodium: 141 mmol/L (ref 135–145)

## 2016-04-30 MED ORDER — LABETALOL HCL 5 MG/ML IV SOLN
10.0000 mg | Freq: Once | INTRAVENOUS | Status: AC
  Administered 2016-04-30: 10 mg via INTRAVENOUS
  Filled 2016-04-30: qty 4

## 2016-04-30 MED ORDER — PROCHLORPERAZINE EDISYLATE 5 MG/ML IJ SOLN
10.0000 mg | Freq: Once | INTRAMUSCULAR | Status: AC
  Administered 2016-04-30: 10 mg via INTRAVENOUS
  Filled 2016-04-30: qty 2

## 2016-04-30 MED ORDER — KETOROLAC TROMETHAMINE 15 MG/ML IJ SOLN
15.0000 mg | Freq: Once | INTRAMUSCULAR | Status: AC
  Administered 2016-04-30: 15 mg via INTRAVENOUS
  Filled 2016-04-30: qty 1

## 2016-04-30 MED ORDER — DIPHENHYDRAMINE HCL 50 MG/ML IJ SOLN
12.5000 mg | Freq: Once | INTRAMUSCULAR | Status: AC
  Administered 2016-04-30: 12.5 mg via INTRAVENOUS
  Filled 2016-04-30: qty 1

## 2016-04-30 MED ORDER — SODIUM CHLORIDE 0.9 % IV BOLUS (SEPSIS)
1000.0000 mL | Freq: Once | INTRAVENOUS | Status: AC
  Administered 2016-04-30: 1000 mL via INTRAVENOUS

## 2016-04-30 MED ORDER — AMLODIPINE BESYLATE 10 MG PO TABS: 10.0000 mg | ORAL_TABLET | Freq: Every day | ORAL | Status: DC

## 2016-04-30 NOTE — Discharge Instructions (Signed)
General Headache Without Cause °A headache is pain or discomfort felt around the head or neck area. The specific cause of a headache may not be found. There are many causes and types of headaches. A few common ones are: °· Tension headaches. °· Migraine headaches. °· Cluster headaches. °· Chronic daily headaches. °HOME CARE INSTRUCTIONS  °Watch your condition for any changes. Take these steps to help with your condition: °Managing Pain °· Take over-the-counter and prescription medicines only as told by your health care provider. °· Lie down in a dark, quiet room when you have a headache. °· If directed, apply ice to the head and neck area: °· Put ice in a plastic bag. °· Place a towel between your skin and the bag. °· Leave the ice on for 20 minutes, 2-3 times per day. °· Use a heating pad or hot shower to apply heat to the head and neck area as told by your health care provider. °· Keep lights dim if bright lights bother you or make your headaches worse. °Eating and Drinking °· Eat meals on a regular schedule. °· Limit alcohol use. °· Decrease the amount of caffeine you drink, or stop drinking caffeine. °General Instructions °· Keep all follow-up visits as told by your health care provider. This is important. °· Keep a headache journal to help find out what may trigger your headaches. For example, write down: °· What you eat and drink. °· How much sleep you get. °· Any change to your diet or medicines. °· Try massage or other relaxation techniques. °· Limit stress. °· Sit up straight, and do not tense your muscles. °· Do not use tobacco products, including cigarettes, chewing tobacco, or e-cigarettes. If you need help quitting, ask your health care provider. °· Exercise regularly as told by your health care provider. °· Sleep on a regular schedule. Get 7-9 hours of sleep, or the amount recommended by your health care provider. °SEEK MEDICAL CARE IF:  °· Your symptoms are not helped by medicine. °· You have a  headache that is different from the usual headache. °· You have nausea or you vomit. °· You have a fever. °SEEK IMMEDIATE MEDICAL CARE IF:  °· Your headache becomes severe. °· You have repeated vomiting. °· You have a stiff neck. °· You have a loss of vision. °· You have problems with speech. °· You have pain in the eye or ear. °· You have muscular weakness or loss of muscle control. °· You lose your balance or have trouble walking. °· You feel faint or pass out. °· You have confusion. °  °This information is not intended to replace advice given to you by your health care provider. Make sure you discuss any questions you have with your health care provider. °  °Document Released: 10/01/2005 Document Revised: 06/22/2015 Document Reviewed: 01/24/2015 °Elsevier Interactive Patient Education ©2016 [employer] ° °Hypertension °Hypertension, commonly called high blood pressure, is when the force of blood pumping through your arteries is too strong. Your arteries are the blood vessels that carry blood from your heart throughout your body. A blood pressure reading consists of a higher number over a lower number, such as 110/72. The higher number (systolic) is the pressure inside your arteries when your heart pumps. The lower number (diastolic) is the pressure inside your arteries when your heart relaxes. Ideally you want your blood pressure below 120/80. °Hypertension forces your heart to work harder to pump blood. Your arteries may become narrow or stiff. Having untreated or   uncontrolled hypertension can cause heart attack, stroke, kidney disease, and other problems. °RISK FACTORS °Some risk factors for high blood pressure are controllable. Others are not.  °Risk factors you cannot control include:  °· Race. You may be at higher risk if you are African American. °· Age. Risk increases with age. °· Gender. Men are at higher risk than women before age 45 years. After age 65, women are at higher risk than men. °Risk factors  you can control include: °· Not getting enough exercise or physical activity. °· Being overweight. °· Getting too much fat, sugar, calories, or salt in your diet. °· Drinking too much alcohol. °SIGNS AND SYMPTOMS °Hypertension does not usually cause signs or symptoms. Extremely high blood pressure (hypertensive crisis) may cause headache, anxiety, shortness of breath, and nosebleed. °DIAGNOSIS °To check if you have hypertension, your health care provider will measure your blood pressure while you are seated, with your arm held at the level of your heart. It should be measured at least twice using the same arm. Certain conditions can cause a difference in blood pressure between your right and left arms. A blood pressure reading that is higher than normal on one occasion does not mean that you need treatment. If it is not clear whether you have high blood pressure, you may be asked to return on a different day to have your blood pressure checked again. Or, you may be asked to monitor your blood pressure at home for 1 or more weeks. °TREATMENT °Treating high blood pressure includes making lifestyle changes and possibly taking medicine. Living a healthy lifestyle can help lower high blood pressure. You may need to change some of your habits. °Lifestyle changes may include: °· Following the DASH diet. This diet is high in fruits, vegetables, and whole grains. It is low in salt, red meat, and added sugars. °· Keep your sodium intake below 2,300 mg per day. °· Getting at least 30-45 minutes of aerobic exercise at least 4 times per week. °· Losing weight if necessary. °· Not smoking. °· Limiting alcoholic beverages. °· Learning ways to reduce stress. °Your health care provider may prescribe medicine if lifestyle changes are not enough to get your blood pressure under control, and if one of the following is true: °· You are 18-59 years of age and your systolic blood pressure is above 140. °· You are 60 years of age or older,  and your systolic blood pressure is above 150. °· Your diastolic blood pressure is above 90. °· You have diabetes, and your systolic blood pressure is over 140 or your diastolic blood pressure is over 90. °· You have kidney disease and your blood pressure is above 140/90. °· You have heart disease and your blood pressure is above 140/90. °Your personal target blood pressure may vary depending on your medical conditions, your age, and other factors. °HOME CARE INSTRUCTIONS °· Have your blood pressure rechecked as directed by your health care provider.   °· Take medicines only as directed by your health care provider. Follow the directions carefully. Blood pressure medicines must be taken as prescribed. The medicine does not work as well when you skip doses. Skipping doses also puts you at risk for problems. °· Do not smoke.   °· Monitor your blood pressure at home as directed by your health care provider.  °SEEK MEDICAL CARE IF:  °· You think you are having a reaction to medicines taken. °· You have recurrent headaches or feel dizzy. °· You have swelling in your   ankles. °· You have trouble with your vision. °SEEK IMMEDIATE MEDICAL CARE IF: °· You develop a severe headache or confusion. °· You have unusual weakness, numbness, or feel faint. °· You have severe chest or abdominal pain. °· You vomit repeatedly. °· You have trouble breathing. °MAKE SURE YOU:  °· Understand these instructions. °· Will watch your condition. °· Will get help right away if you are not doing well or get worse. °  °This information is not intended to replace advice given to you by your health care provider. Make sure you discuss any questions you have with your health care provider. °  °Document Released: 10/01/2005 Document Revised: 02/15/2015 Document Reviewed: 07/24/2013 °Elsevier Interactive Patient Education ©2016 [employer] ° °

## 2016-04-30 NOTE — ED Notes (Signed)
Patient transported to CT 

## 2016-04-30 NOTE — ED Notes (Signed)
Pt with headache starting yesterday. Elevated bp on arrival. Hx of pituitary gland tumor removal 2 1/2 years ago.  Pt bp repeated in triage and has not changed.

## 2016-04-30 NOTE — ED Provider Notes (Signed)
CSN: [id number]     Arrival date & time 04/30/16  1812 History  By signing my name below, I, Evelene Croon, attest that this documentation has been prepared under the direction and in the presence of Virgel Manifold, MD . Electronically Signed: Evelene Croon, Scribe. 04/30/2016. 7:26 PM.    Chief Complaint  Patient presents with  . Headache   The history is provided by the patient. No language interpreter was used.   HPI Comments:  Sherry Fitzpatrick is a 66 y.o. female with a history of HTN,  who presents to the Emergency Department complaining of a gradual onset, gradually worsening  HA x ~ 4 days. Her pain is located to the left side of her head. She notes her pain today is similar to her HA when she was diagnosed with a pituitary tumor that was removed 2 years ago. Pt has tried tylenol without relief.  Pt reports associated nausea, 1 small episode of vomiting, paresthesia to the left hand x ~ 1 week and she notes floaters in her left eye. She denies fevers, and chills. She has been out of her Amlodipine x ~ 1.5 weeks. She called her PCP and was told a refill would be sent to the pharmacy but it has not been available for pick up. Pt is currently on plavix.  [address]   Past Medical History  Diagnosis Date  . Other and unspecified hyperlipidemia   . Unspecified visual disturbance   . Generalized pain   . Alterations of sensations, late effect of cerebrovascular disease   . Carpal tunnel syndrome   . Other postprocedural status(V45.89)   . Unspecified disorder of the pituitary gland and its hypothalamic control   . Backache, unspecified   . Esophageal reflux   . Body mass index 40.0-44.9, adult ([place])   . Unspecified essential hypertension   . Other symptoms involving cardiovascular system   . Other malaise and fatigue   . Other abnormal glucose   . Dizziness and giddiness   . Heart murmur   . Sleep apnea     "did test; they wanted me to get a CPAP but I never did get it"  (04/14/2015  . Headache     "had alot til I had pituitary tumor removed"  . Stroke ([place]) 06/2003; 02/2010    "minor; minor" ; denies residual on 04/14/2015  . Arthritis     "all over"   Past Surgical History  Procedure Laterality Date  . Transphenoidal / transnasal hypophysectomy / resection pituitary tumor  06/13/2012  . Total hip arthroplasty Right 11/06/2010  . Carpal tunnel release Left ~ 2014  . Joint replacement    . Tubal ligation  1974   Family History  Problem Relation Age of Onset  . Hypertension Sister   . Heart failure Sister   . Hypertension Daughter   . Heart failure Mother   . Hypertension Mother   . Diabetes Father    Social History  Substance Use Topics  . Smoking status: Never Smoker   . Smokeless tobacco: Never Used  . Alcohol Use: 0.0 oz/week    0 Standard drinks or equivalent per week     Comment: 04/14/2015 "might have a glass of wine q 5-6 months, if that"   OB History    No data available     Review of Systems  Constitutional: Negative for fever.  Eyes: Positive for visual disturbance.  Gastrointestinal: Positive for nausea and vomiting.  Neurological: Positive for numbness and headaches.  All other systems reviewed and are negative.  Allergies  Penicillins and Percocet  Home Medications   Prior to Admission medications   Medication Sig Start Date End Date Taking? Authorizing Provider  albuterol (PROVENTIL HFA;VENTOLIN HFA) 108 (90 BASE) MCG/ACT inhaler Inhale 2 puffs into the lungs every 6 (six) hours as needed for wheezing.    Historical Provider, MD  amLODipine (NORVASC) 10 MG tablet Take 10 mg by mouth daily.    Historical Provider, MD  atorvastatin (LIPITOR) 80 MG tablet Take 80 mg by mouth daily.    Historical Provider, MD  cholecalciferol (VITAMIN D) 400 UNITS TABS Take 400 Units by mouth daily.    Historical Provider, MD  cloNIDine (CATAPRES) 0.3 MG tablet Take 0.3 mg by mouth 2 (two) times daily.    Historical Provider, MD  clopidogrel  (PLAVIX) 75 MG tablet Take 75 mg by mouth daily.    Historical Provider, MD  fluticasone (FLOVENT DISKUS) 50 MCG/BLIST diskus inhaler Inhale 1 puff into the lungs 2 (two) times daily.    Historical Provider, MD  hydrochlorothiazide (HYDRODIURIL) 25 MG tablet Take 25 mg by mouth daily.    Historical Provider, MD  loratadine (CLARITIN) 10 MG tablet Take 10 mg by mouth daily.    Historical Provider, MD  methocarbamol (ROBAXIN) 500 MG tablet Take 500 mg by mouth every 6 (six) hours as needed for muscle spasms.    Historical Provider, MD  metoprolol succinate (TOPROL-XL) 25 MG 24 hr tablet Take 25 mg by mouth daily.    Historical Provider, MD  nitroGLYCERIN (NITROSTAT) 0.4 MG SL tablet Place 1 tablet (0.4 mg total) under the tongue every 5 (five) minutes as needed for chest pain (hold for SBP < 110). 04/16/15   Ripudeep K Rai, MD  potassium chloride (K-DUR,KLOR-CON) 10 MEQ tablet Take 10 mEq by mouth daily.    Historical Provider, MD  ranitidine (ZANTAC) 150 MG capsule Take 150 mg by mouth 2 (two) times daily.    Historical Provider, MD  traMADol (ULTRAM) 50 MG tablet Take 50 mg by mouth every 6 (six) hours as needed for pain. Reported on 02/09/2016    Historical Provider, MD  valsartan (DIOVAN) 160 MG tablet Take 160 mg by mouth daily.    Historical Provider, MD   BP 228/91 mmHg  Pulse 65  Temp(Src) 99 F (37.2 C) (Oral)  Resp 14  Ht '5\' 4"'$  (1.626 m)  Wt 238 lb (107.956 kg)  BMI 40.83 kg/m2  SpO2 100% Physical Exam  Constitutional: She is oriented to person, place, and time. She appears well-developed and well-nourished. No distress.  HENT:  Head: Normocephalic and atraumatic.  Eyes: EOM are normal.  Neck: Normal range of motion.  Cardiovascular: Normal rate, regular rhythm and normal heart sounds.   Pulmonary/Chest: Effort normal and breath sounds normal.  Abdominal: Soft. She exhibits no distension. There is no tenderness.  Musculoskeletal: Normal range of motion.  Neurological: She is alert  and oriented to person, place, and time. She has normal reflexes. She displays normal reflexes. No cranial nerve deficit. She exhibits normal muscle tone. Coordination normal.  Good finger to nose bilaterally.   Skin: Skin is warm and dry.  Psychiatric: She has a normal mood and affect. Judgment normal.  Nursing note and vitals reviewed.   ED Course  Procedures  DIAGNOSTIC STUDIES:  Oxygen Saturation is 100% on RA, normal by my interpretation.    COORDINATION OF CARE:  7:24 PM Will order pain meds.  Discussed treatment plan with pt  at bedside and pt agreed to plan.  Labs Review Labs Reviewed  CBC WITH DIFFERENTIAL/PLATELET - Abnormal; Notable for the following:    RDW 16.9 (*)    All other components within normal limits  BASIC METABOLIC PANEL - Abnormal; Notable for the following:    Glucose, Bld 101 (*)    All other components within normal limits    Imaging Review Ct Head Wo Contrast  04/30/2016  CLINICAL DATA:  Acute headache, hypertension, remote transsphenoidal pituitary tumor resection. EXAM: CT HEAD WITHOUT CONTRAST TECHNIQUE: Contiguous axial images were obtained from the base of the skull through the vertex without intravenous contrast. COMPARISON:  04/14/2015 FINDINGS: Brain: Minor scattered white matter microvascular ischemic changes throughout the cerebral hemispheres, better appreciated on the MRI comparison. No acute intracranial hemorrhage, mass lesion, new infarction, midline shift, herniation, hydrocephalus, or extra-axial fluid collection. No focal mass effect or edema. Cisterns are patent. No cerebellar abnormality. Vascular: No hyperdense vessel or unexpected calcification. Skull: Negative for fracture or focal lesion. Sinuses/Orbits: No acute findings. Other: None. IMPRESSION: Stable minor chronic white matter microvascular ischemic changes. No interval change or acute process by noncontrast CT. Electronically Signed   By: Jerilynn Mages.  Shick M.D.   On: 04/30/2016 20:37   I  have personally reviewed and evaluated these images and lab results as part of my medical decision-making.   EKG Interpretation None      MDM   Final diagnoses:  Nonintractable headache, unspecified chronicity pattern, unspecified headache type  Essential hypertension   64yF with hypertension and headache. Both improved with meds. Nonfocal neuro exam. Provided with script for amlodipine per request. outpt fu. It has been determined that no acute conditions requiring further emergency intervention are present at this time. The patient has been advised of the diagnosis and plan. I reviewed any labs and imaging including any potential incidental findings. We have discussed signs and symptoms that warrant return to the ED and they are listed in the discharge instructions.     Upon discharge, family member wanted to speak with me.  She had concerns about the patient's blood pressure which I tried to answer but was just met with antagonistic remarks. It was quickly obvious to me that she just wanted to argue. I left the room when she called me an "asshole."   I personally preformed the services scribed in my presence. The recorded information has been reviewed is accurate. Virgel Manifold, MD.    Virgel Manifold, MD 05/02/16 [id number]

## 2016-05-03 DIAGNOSIS — I10 Essential (primary) hypertension: Secondary | ICD-10-CM | POA: Diagnosis not present

## 2016-05-03 DIAGNOSIS — Z1211 Encounter for screening for malignant neoplasm of colon: Secondary | ICD-10-CM | POA: Diagnosis not present

## 2016-05-03 DIAGNOSIS — G44229 Chronic tension-type headache, not intractable: Secondary | ICD-10-CM | POA: Diagnosis not present

## 2017-01-10 DIAGNOSIS — I10 Essential (primary) hypertension: Secondary | ICD-10-CM | POA: Diagnosis not present

## 2017-01-10 DIAGNOSIS — R062 Wheezing: Secondary | ICD-10-CM | POA: Diagnosis not present

## 2017-01-10 DIAGNOSIS — R05 Cough: Secondary | ICD-10-CM | POA: Diagnosis not present

## 2017-01-10 DIAGNOSIS — R0781 Pleurodynia: Secondary | ICD-10-CM | POA: Diagnosis not present

## 2017-01-10 DIAGNOSIS — R51 Headache: Secondary | ICD-10-CM | POA: Diagnosis not present

## 2017-01-10 DIAGNOSIS — R079 Chest pain, unspecified: Secondary | ICD-10-CM | POA: Diagnosis not present

## 2017-01-10 DIAGNOSIS — J40 Bronchitis, not specified as acute or chronic: Secondary | ICD-10-CM | POA: Diagnosis not present

## 2017-01-10 DIAGNOSIS — R093 Abnormal sputum: Secondary | ICD-10-CM | POA: Diagnosis not present

## 2017-01-10 DIAGNOSIS — R9082 White matter disease, unspecified: Secondary | ICD-10-CM | POA: Diagnosis not present

## 2017-01-10 DIAGNOSIS — E785 Hyperlipidemia, unspecified: Secondary | ICD-10-CM | POA: Diagnosis not present

## 2017-06-12 DIAGNOSIS — J069 Acute upper respiratory infection, unspecified: Secondary | ICD-10-CM | POA: Diagnosis not present

## 2017-06-12 DIAGNOSIS — I10 Essential (primary) hypertension: Secondary | ICD-10-CM | POA: Diagnosis not present

## 2017-08-01 DIAGNOSIS — I110 Hypertensive heart disease with heart failure: Secondary | ICD-10-CM | POA: Diagnosis not present

## 2017-08-01 DIAGNOSIS — I161 Hypertensive emergency: Secondary | ICD-10-CM | POA: Diagnosis not present

## 2017-08-01 DIAGNOSIS — R29818 Other symptoms and signs involving the nervous system: Secondary | ICD-10-CM | POA: Diagnosis not present

## 2017-08-01 DIAGNOSIS — R0602 Shortness of breath: Secondary | ICD-10-CM | POA: Diagnosis not present

## 2017-08-01 DIAGNOSIS — R1031 Right lower quadrant pain: Secondary | ICD-10-CM | POA: Diagnosis not present

## 2017-08-01 DIAGNOSIS — I340 Nonrheumatic mitral (valve) insufficiency: Secondary | ICD-10-CM | POA: Diagnosis not present

## 2017-08-01 DIAGNOSIS — R51 Headache: Secondary | ICD-10-CM | POA: Diagnosis not present

## 2017-08-01 DIAGNOSIS — I160 Hypertensive urgency: Secondary | ICD-10-CM | POA: Diagnosis not present

## 2017-08-01 DIAGNOSIS — I10 Essential (primary) hypertension: Secondary | ICD-10-CM | POA: Diagnosis not present

## 2017-08-01 DIAGNOSIS — G4489 Other headache syndrome: Secondary | ICD-10-CM | POA: Diagnosis not present

## 2017-08-01 DIAGNOSIS — R1084 Generalized abdominal pain: Secondary | ICD-10-CM | POA: Diagnosis not present

## 2017-08-01 DIAGNOSIS — I509 Heart failure, unspecified: Secondary | ICD-10-CM | POA: Diagnosis not present

## 2017-08-01 DIAGNOSIS — E785 Hyperlipidemia, unspecified: Secondary | ICD-10-CM | POA: Diagnosis not present

## 2017-08-01 DIAGNOSIS — D259 Leiomyoma of uterus, unspecified: Secondary | ICD-10-CM | POA: Diagnosis not present

## 2017-08-01 DIAGNOSIS — I700 Atherosclerosis of aorta: Secondary | ICD-10-CM | POA: Diagnosis not present

## 2017-08-01 DIAGNOSIS — I371 Nonrheumatic pulmonary valve insufficiency: Secondary | ICD-10-CM | POA: Diagnosis not present

## 2017-08-02 DIAGNOSIS — R1031 Right lower quadrant pain: Secondary | ICD-10-CM | POA: Diagnosis not present

## 2017-08-02 DIAGNOSIS — I160 Hypertensive urgency: Secondary | ICD-10-CM | POA: Diagnosis not present

## 2017-08-02 DIAGNOSIS — I161 Hypertensive emergency: Secondary | ICD-10-CM | POA: Diagnosis not present

## 2017-08-02 DIAGNOSIS — I509 Heart failure, unspecified: Secondary | ICD-10-CM | POA: Diagnosis not present

## 2017-08-02 DIAGNOSIS — R51 Headache: Secondary | ICD-10-CM | POA: Diagnosis not present

## 2017-08-06 DIAGNOSIS — I10 Essential (primary) hypertension: Secondary | ICD-10-CM | POA: Diagnosis not present

## 2017-08-06 DIAGNOSIS — Z23 Encounter for immunization: Secondary | ICD-10-CM | POA: Diagnosis not present

## 2017-08-06 DIAGNOSIS — J309 Allergic rhinitis, unspecified: Secondary | ICD-10-CM | POA: Diagnosis not present

## 2017-08-07 DIAGNOSIS — G4489 Other headache syndrome: Secondary | ICD-10-CM | POA: Diagnosis not present

## 2017-08-07 DIAGNOSIS — G44209 Tension-type headache, unspecified, not intractable: Secondary | ICD-10-CM | POA: Diagnosis not present

## 2017-08-07 DIAGNOSIS — R29818 Other symptoms and signs involving the nervous system: Secondary | ICD-10-CM | POA: Diagnosis not present

## 2017-08-07 DIAGNOSIS — I6523 Occlusion and stenosis of bilateral carotid arteries: Secondary | ICD-10-CM | POA: Diagnosis not present

## 2017-08-07 DIAGNOSIS — R531 Weakness: Secondary | ICD-10-CM | POA: Diagnosis not present

## 2017-08-07 DIAGNOSIS — I10 Essential (primary) hypertension: Secondary | ICD-10-CM | POA: Diagnosis not present

## 2017-08-14 DIAGNOSIS — R05 Cough: Secondary | ICD-10-CM | POA: Diagnosis not present

## 2017-08-22 DIAGNOSIS — J189 Pneumonia, unspecified organism: Secondary | ICD-10-CM | POA: Diagnosis not present

## 2017-08-22 DIAGNOSIS — J069 Acute upper respiratory infection, unspecified: Secondary | ICD-10-CM | POA: Diagnosis not present

## 2017-08-22 DIAGNOSIS — J984 Other disorders of lung: Secondary | ICD-10-CM | POA: Diagnosis not present

## 2017-11-15 DIAGNOSIS — R05 Cough: Secondary | ICD-10-CM | POA: Diagnosis not present

## 2017-11-15 DIAGNOSIS — I10 Essential (primary) hypertension: Secondary | ICD-10-CM | POA: Diagnosis not present

## 2017-11-15 DIAGNOSIS — I517 Cardiomegaly: Secondary | ICD-10-CM | POA: Diagnosis not present

## 2017-11-15 DIAGNOSIS — J069 Acute upper respiratory infection, unspecified: Secondary | ICD-10-CM | POA: Diagnosis not present

## 2017-12-02 DIAGNOSIS — I517 Cardiomegaly: Secondary | ICD-10-CM | POA: Diagnosis not present

## 2017-12-02 DIAGNOSIS — R51 Headache: Secondary | ICD-10-CM | POA: Diagnosis not present

## 2017-12-02 DIAGNOSIS — R9389 Abnormal findings on diagnostic imaging of other specified body structures: Secondary | ICD-10-CM | POA: Diagnosis not present

## 2017-12-02 DIAGNOSIS — R413 Other amnesia: Secondary | ICD-10-CM | POA: Diagnosis not present

## 2017-12-02 DIAGNOSIS — I110 Hypertensive heart disease with heart failure: Secondary | ICD-10-CM | POA: Diagnosis not present

## 2017-12-02 DIAGNOSIS — R079 Chest pain, unspecified: Secondary | ICD-10-CM | POA: Diagnosis not present

## 2017-12-02 DIAGNOSIS — R0989 Other specified symptoms and signs involving the circulatory and respiratory systems: Secondary | ICD-10-CM | POA: Diagnosis not present

## 2017-12-02 DIAGNOSIS — Z1211 Encounter for screening for malignant neoplasm of colon: Secondary | ICD-10-CM | POA: Diagnosis not present

## 2017-12-02 DIAGNOSIS — Z1231 Encounter for screening mammogram for malignant neoplasm of breast: Secondary | ICD-10-CM | POA: Diagnosis not present

## 2017-12-03 DIAGNOSIS — E119 Type 2 diabetes mellitus without complications: Secondary | ICD-10-CM | POA: Diagnosis not present

## 2017-12-03 DIAGNOSIS — R410 Disorientation, unspecified: Secondary | ICD-10-CM | POA: Diagnosis not present

## 2017-12-03 DIAGNOSIS — R0789 Other chest pain: Secondary | ICD-10-CM | POA: Diagnosis not present

## 2017-12-03 DIAGNOSIS — R05 Cough: Secondary | ICD-10-CM | POA: Diagnosis not present

## 2017-12-03 DIAGNOSIS — R51 Headache: Secondary | ICD-10-CM | POA: Diagnosis not present

## 2017-12-03 DIAGNOSIS — Z1159 Encounter for screening for other viral diseases: Secondary | ICD-10-CM | POA: Diagnosis not present

## 2017-12-03 DIAGNOSIS — I10 Essential (primary) hypertension: Secondary | ICD-10-CM | POA: Diagnosis not present

## 2017-12-21 ENCOUNTER — Encounter (HOSPITAL_BASED_OUTPATIENT_CLINIC_OR_DEPARTMENT_OTHER): Payer: Self-pay | Admitting: Emergency Medicine

## 2017-12-21 ENCOUNTER — Emergency Department (HOSPITAL_BASED_OUTPATIENT_CLINIC_OR_DEPARTMENT_OTHER): Payer: Medicare HMO

## 2017-12-21 ENCOUNTER — Other Ambulatory Visit: Payer: Self-pay

## 2017-12-21 ENCOUNTER — Emergency Department (HOSPITAL_BASED_OUTPATIENT_CLINIC_OR_DEPARTMENT_OTHER)
Admission: EM | Admit: 2017-12-21 | Discharge: 2017-12-21 | Disposition: A | Discharge status: Home / Self Care | Payer: Medicare HMO | Attending: Emergency Medicine | Admitting: Emergency Medicine

## 2017-12-21 DIAGNOSIS — Z79899 Other long term (current) drug therapy: Secondary | ICD-10-CM | POA: Diagnosis not present

## 2017-12-21 DIAGNOSIS — Z7722 Contact with and (suspected) exposure to environmental tobacco smoke (acute) (chronic): Secondary | ICD-10-CM | POA: Insufficient documentation

## 2017-12-21 DIAGNOSIS — R05 Cough: Secondary | ICD-10-CM | POA: Diagnosis not present

## 2017-12-21 DIAGNOSIS — J40 Bronchitis, not specified as acute or chronic: Secondary | ICD-10-CM | POA: Diagnosis not present

## 2017-12-21 DIAGNOSIS — J45901 Unspecified asthma with (acute) exacerbation: Secondary | ICD-10-CM | POA: Insufficient documentation

## 2017-12-21 DIAGNOSIS — Z96641 Presence of right artificial hip joint: Secondary | ICD-10-CM | POA: Insufficient documentation

## 2017-12-21 DIAGNOSIS — E876 Hypokalemia: Secondary | ICD-10-CM | POA: Diagnosis not present

## 2017-12-21 DIAGNOSIS — R69 Illness, unspecified: Secondary | ICD-10-CM

## 2017-12-21 DIAGNOSIS — R0981 Nasal congestion: Secondary | ICD-10-CM | POA: Diagnosis not present

## 2017-12-21 DIAGNOSIS — J111 Influenza due to unidentified influenza virus with other respiratory manifestations: Secondary | ICD-10-CM | POA: Insufficient documentation

## 2017-12-21 DIAGNOSIS — I10 Essential (primary) hypertension: Secondary | ICD-10-CM | POA: Diagnosis not present

## 2017-12-21 DIAGNOSIS — Z8673 Personal history of transient ischemic attack (TIA), and cerebral infarction without residual deficits: Secondary | ICD-10-CM | POA: Diagnosis not present

## 2017-12-21 LAB — CBC WITH DIFFERENTIAL/PLATELET
BASOS ABS: 0 10*3/uL (ref 0.0–0.1)
BASOS PCT: 1 %
Eosinophils Absolute: 0.5 10*3/uL (ref 0.0–0.7)
Eosinophils Relative: 7 %
HCT: 37 % (ref 36.0–46.0)
Hemoglobin: 12.4 g/dL (ref 12.0–15.0)
Lymphocytes Relative: 34 %
Lymphs Abs: 2.2 10*3/uL (ref 0.7–4.0)
MCH: 29.5 pg (ref 26.0–34.0)
MCHC: 33.5 g/dL (ref 30.0–36.0)
MCV: 88.1 fL (ref 78.0–100.0)
MONO ABS: 1 10*3/uL (ref 0.1–1.0)
Monocytes Relative: 15 %
NEUTROS ABS: 2.8 10*3/uL (ref 1.7–7.7)
Neutrophils Relative %: 43 %
Platelets: 291 10*3/uL (ref 150–400)
RBC: 4.2 MIL/uL (ref 3.87–5.11)
RDW: 14.6 % (ref 11.5–15.5)
WBC: 6.6 10*3/uL (ref 4.0–10.5)

## 2017-12-21 LAB — BASIC METABOLIC PANEL
ANION GAP: 10 (ref 5–15)
BUN: 11 mg/dL (ref 6–20)
CO2: 26 mmol/L (ref 22–32)
Calcium: 8.4 mg/dL — ABNORMAL LOW (ref 8.9–10.3)
Chloride: 102 mmol/L (ref 101–111)
Creatinine, Ser: 0.93 mg/dL (ref 0.44–1.00)
GFR calc Af Amer: >60 mL/min (ref >60)
GFR calc non Af Amer: >60 mL/min (ref >60)
GLUCOSE: 148 mg/dL — AB (ref 65–99)
Potassium: 2.8 mmol/L — ABNORMAL LOW (ref 3.5–5.1)
Sodium: 138 mmol/L (ref 135–145)

## 2017-12-21 MED ORDER — POTASSIUM CHLORIDE CRYS ER 20 MEQ PO TBCR
40.0000 meq | EXTENDED_RELEASE_TABLET | Freq: Once | ORAL | Status: AC
  Administered 2017-12-21: 40 meq via ORAL
  Filled 2017-12-21: qty 2

## 2017-12-21 MED ORDER — DM-GUAIFENESIN ER 30-600 MG PO TB12: 1.0000 | ORAL_TABLET | Freq: Two times a day (BID) | ORAL | 1 refills | Status: DC

## 2017-12-21 MED ORDER — POTASSIUM CHLORIDE ER 10 MEQ PO TBCR: 10.0000 meq | EXTENDED_RELEASE_TABLET | Freq: Two times a day (BID) | ORAL | 0 refills | Status: DC

## 2017-12-21 NOTE — Discharge Instructions (Signed)
Workup here today without evidence of pneumonia.  Labs without significant abnormalities other than potassium was low.  Some information on foods that are high in potassium provided.  Take the potassium supplementation twice a day for the next 4 days.  Make an appointment to follow-up with your regular doctor to have potassium rechecked.  For the cough and congestion take the Mucinex as directed.  Return for any new or worse symptoms.

## 2017-12-21 NOTE — ED Provider Notes (Signed)
[facility] EMERGENCY DEPARTMENT Provider Note   CSN: [id number] Arrival date & time: 12/21/17  8756     History   Chief Complaint Chief Complaint  Patient presents with  . Cough    HPI Sherry Fitzpatrick is a 67 y.o. female.  Patient with complaint of upper respiratory cold-like symptoms for 2 months.  Worse in the last 2 weeks with increased cough congestion and productive cough.  No fever no nausea vomiting no diarrhea.  Patient not taking any medications for this.  Patient not seen recently by primary care provider.      Past Medical History:  Diagnosis Date  . Alterations of sensations, late effect of cerebrovascular disease(438.6)   . Arthritis    "all over"  . Backache, unspecified   . Body mass index 40.0-44.9, adult ([place])   . Carpal tunnel syndrome   . Dizziness and giddiness   . Esophageal reflux   . Generalized pain   . Headache    "had alot til I had pituitary tumor removed"  . Heart murmur   . Other abnormal glucose   . Other and unspecified hyperlipidemia   . Other malaise and fatigue   . Other postprocedural status(V45.89)   . Other symptoms involving cardiovascular system   . Sleep apnea    "did test; they wanted me to get a CPAP but I never did get it" (04/14/2015  . Stroke ([place]) 06/2003; 02/2010   "minor; minor" ; denies residual on 04/14/2015  . Unspecified disorder of the pituitary gland and its hypothalamic control   . Unspecified essential hypertension   . Unspecified visual disturbance     Patient Active Problem List   Diagnosis Date Noted  . Stroke ([place])   . Chest pain 04/14/2015  . Right sided weakness 04/14/2015  . Hyperlipidemia   . Body mass index 40.0-44.9, adult ([place])   . Essential hypertension   . Blood glucose elevated   . Asthma   . OSA (obstructive sleep apnea) 05/06/2013    Past Surgical History:  Procedure Laterality Date  . CARPAL TUNNEL RELEASE Left ~ 2014  . JOINT REPLACEMENT    . TOTAL HIP ARTHROPLASTY  Right 11/06/2010  . TRANSPHENOIDAL / TRANSNASAL HYPOPHYSECTOMY / RESECTION PITUITARY TUMOR  06/13/2012  . TUBAL LIGATION  1974    OB History    No data available       Home Medications    Prior to Admission medications   Medication Sig Start Date End Date Taking? Authorizing Provider  albuterol (PROVENTIL HFA;VENTOLIN HFA) 108 (90 BASE) MCG/ACT inhaler Inhale 2 puffs into the lungs every 6 (six) hours as needed for wheezing.    [provider]  amLODipine (NORVASC) 10 MG tablet Take 1 tablet (10 mg total) by mouth daily. 04/30/16   Virgel Manifold, MD  atorvastatin (LIPITOR) 80 MG tablet Take 80 mg by mouth daily.    [provider]  cloNIDine (CATAPRES) 0.3 MG tablet Take 0.3 mg by mouth 2 (two) times daily.    [provider]  clopidogrel (PLAVIX) 75 MG tablet Take 75 mg by mouth daily.    [provider]  dextromethorphan-guaiFENesin (MUCINEX DM) 30-600 MG 12hr tablet Take 1 tablet by mouth 2 (two) times daily. 12/21/17   Fredia Sorrow, MD  fluticasone (FLOVENT DISKUS) 50 MCG/BLIST diskus inhaler Inhale 1 puff into the lungs 2 (two) times daily as needed (sob).     [provider]  hydrochlorothiazide (HYDRODIURIL) 25 MG tablet Take 25 mg by  mouth daily.    [provider]  loratadine (CLARITIN) 10 MG tablet Take 10 mg by mouth daily.    [provider]  metoprolol succinate (TOPROL-XL) 25 MG 24 hr tablet Take 25 mg by mouth daily.    [provider]  nitroGLYCERIN (NITROSTAT) 0.4 MG SL tablet Place 1 tablet (0.4 mg total) under the tongue every 5 (five) minutes as needed for chest pain (hold for SBP < 110). 04/16/15   Rai, Ripudeep K, MD  potassium chloride (K-DUR) 10 MEQ tablet Take 1 tablet (10 mEq total) by mouth 2 (two) times daily. 12/21/17   Fredia Sorrow, MD  potassium chloride (K-DUR,KLOR-CON) 10 MEQ tablet Take 10 mEq by mouth daily.    [provider]  traMADol (ULTRAM) 50 MG tablet Take 50 mg by  mouth every 6 (six) hours as needed for pain. Reported on 02/09/2016    [provider]  valsartan (DIOVAN) 160 MG tablet Take 160 mg by mouth daily.    [provider]    Family History Family History  Problem Relation Age of Onset  . Heart failure Mother   . Hypertension Mother   . Diabetes Father   . Hypertension Sister   . Heart failure Sister   . Hypertension Daughter     Social History Social History   Tobacco Use  . Smoking status: Passive Smoke Exposure - Never Smoker  . Smokeless tobacco: Never Used  Substance Use Topics  . Alcohol use: Yes    Alcohol/week: 0.0 oz    Comment: 04/14/2015 "might have a glass of wine q 5-6 months, if that"  . Drug use: No     Allergies   Penicillins and Percocet [oxycodone-acetaminophen]   Review of Systems Review of Systems  Constitutional: Negative for fever.  HENT: Positive for congestion.   Eyes: Negative for redness.  Respiratory: Positive for cough.   Cardiovascular: Negative for chest pain.  Gastrointestinal: Negative for abdominal pain, diarrhea, nausea and vomiting.  Genitourinary: Negative for dysuria.  Musculoskeletal: Negative for myalgias.  Skin: Negative for rash.  Neurological: Negative for headaches.  Hematological: Does not bruise/bleed easily.  Psychiatric/Behavioral: Negative for confusion.     Physical Exam Updated Vital Signs BP (!) 154/108 (BP Location: Left Arm)   Pulse 84   Temp 98.8 F (37.1 C) (Oral)   Resp 20   Ht 1.6 m (5\' 3" )   Wt 113.4 kg (250 lb)   SpO2 100%   BMI 44.29 kg/m   Physical Exam  Constitutional: She is oriented to person, place, and time. She appears well-developed and well-nourished. No distress.  HENT:  Head: Normocephalic and atraumatic.  Mouth/Throat: Oropharynx is clear and moist. No oropharyngeal exudate.  Eyes: Conjunctivae and EOM are normal. Pupils are equal, round, and reactive to light.  Neck: Normal range of motion. Neck supple.    Cardiovascular: Normal rate and regular rhythm.  No murmur heard. Pulmonary/Chest: Effort normal and breath sounds normal. No respiratory distress. She has no wheezes. She has no rales.  Abdominal: Soft. Bowel sounds are normal. There is no tenderness.  Musculoskeletal: Normal range of motion. She exhibits no edema.  Neurological: She is alert and oriented to person, place, and time. No cranial nerve deficit or sensory deficit. She exhibits normal muscle tone. Coordination normal.  Skin: Skin is warm. No rash noted.  Nursing note and vitals reviewed.    ED Treatments / Results  Labs (all labs ordered are listed, but only abnormal results are displayed) Labs  Reviewed  BASIC METABOLIC PANEL - Abnormal; Notable for the following components:      Result Value   Potassium 2.8 (*)    Glucose, Bld 148 (*)    Calcium 8.4 (*)    All other components within normal limits  CBC WITH DIFFERENTIAL/PLATELET    EKG  EKG Interpretation None       Radiology Dg Chest 2 View  Result Date: 12/21/2017 CLINICAL DATA:  Cough EXAM: CHEST - 2 VIEW COMPARISON:  11/15/2017 FINDINGS: Cardiomegaly. No confluent airspace opacities, effusions or edema. Slight interstitial prominence is stable since prior study and dating back to remote studies from 01/10/2017. IMPRESSION: Stable cardiomegaly and chronic interstitial prominence. Electronically Signed   By: Rolm Baptise M.D.   On: 12/21/2017 09:11    Procedures Procedures (including critical care time)  Medications Ordered in ED Medications  potassium chloride SA (K-DUR,KLOR-CON) CR tablet 40 mEq (not administered)     Initial Impression / Assessment and Plan / ED Course  I have reviewed the triage vital signs and the nursing notes.  Pertinent labs & imaging results that were available during my care of the patient were reviewed by me and considered in my medical decision making (see chart for details).     Workup here today without evidence of  pneumonia.  Labs without significant abnormality other than hypokalemia.  With normal renal function.  Will start on potassium supplementation.  Symptoms could be consistent with influenza.  However patient's had symptoms for 2 weeks that have been more severe so out of the window for Tamiflu.  Will be treated like it is of bronchitis with Mucinex.  Patient nontoxic no acute distress.  Final Clinical Impressions(s) / ED Diagnoses   Final diagnoses:  Influenza-like illness  Bronchitis  Hypokalemia    ED Discharge Orders        Ordered    dextromethorphan-guaiFENesin [facility]) 30-600 MG 12hr tablet  2 times daily     12/21/17 0938    potassium chloride (K-DUR) 10 MEQ tablet  2 times daily     12/21/17 7017       Fredia Sorrow, MD 12/21/17 [phone]

## 2017-12-21 NOTE — ED Notes (Signed)
Patient transported to X-ray 

## 2017-12-21 NOTE — ED Triage Notes (Signed)
Cough x 2 months, h/a and body aches x 2 weeks . Has not taken OTC meds to help with these symptoms

## 2018-02-20 ENCOUNTER — Inpatient Hospital Stay (HOSPITAL_BASED_OUTPATIENT_CLINIC_OR_DEPARTMENT_OTHER)
Admission: EM | Admit: 2018-02-20 | Discharge: 2018-02-24 | DRG: 304 | Disposition: A | Discharge status: Home / Self Care | Payer: Medicare HMO | Attending: Family Medicine | Admitting: Family Medicine

## 2018-02-20 ENCOUNTER — Emergency Department (HOSPITAL_BASED_OUTPATIENT_CLINIC_OR_DEPARTMENT_OTHER): Payer: Medicare HMO

## 2018-02-20 ENCOUNTER — Other Ambulatory Visit: Payer: Self-pay

## 2018-02-20 ENCOUNTER — Encounter (HOSPITAL_BASED_OUTPATIENT_CLINIC_OR_DEPARTMENT_OTHER): Payer: Self-pay | Admitting: Emergency Medicine

## 2018-02-20 DIAGNOSIS — E669 Obesity, unspecified: Secondary | ICD-10-CM | POA: Diagnosis not present

## 2018-02-20 DIAGNOSIS — Z833 Family history of diabetes mellitus: Secondary | ICD-10-CM

## 2018-02-20 DIAGNOSIS — Y92009 Unspecified place in unspecified non-institutional (private) residence as the place of occurrence of the external cause: Secondary | ICD-10-CM

## 2018-02-20 DIAGNOSIS — J441 Chronic obstructive pulmonary disease with (acute) exacerbation: Secondary | ICD-10-CM | POA: Diagnosis not present

## 2018-02-20 DIAGNOSIS — Z7902 Long term (current) use of antithrombotics/antiplatelets: Secondary | ICD-10-CM | POA: Diagnosis not present

## 2018-02-20 DIAGNOSIS — B348 Other viral infections of unspecified site: Secondary | ICD-10-CM | POA: Diagnosis not present

## 2018-02-20 DIAGNOSIS — E785 Hyperlipidemia, unspecified: Secondary | ICD-10-CM | POA: Diagnosis present

## 2018-02-20 DIAGNOSIS — I10 Essential (primary) hypertension: Secondary | ICD-10-CM

## 2018-02-20 DIAGNOSIS — Z8673 Personal history of transient ischemic attack (TIA), and cerebral infarction without residual deficits: Secondary | ICD-10-CM

## 2018-02-20 DIAGNOSIS — B9789 Other viral agents as the cause of diseases classified elsewhere: Secondary | ICD-10-CM | POA: Diagnosis present

## 2018-02-20 DIAGNOSIS — R0789 Other chest pain: Secondary | ICD-10-CM | POA: Diagnosis not present

## 2018-02-20 DIAGNOSIS — Z8249 Family history of ischemic heart disease and other diseases of the circulatory system: Secondary | ICD-10-CM

## 2018-02-20 DIAGNOSIS — Z96641 Presence of right artificial hip joint: Secondary | ICD-10-CM | POA: Diagnosis present

## 2018-02-20 DIAGNOSIS — E876 Hypokalemia: Secondary | ICD-10-CM | POA: Diagnosis present

## 2018-02-20 DIAGNOSIS — Z91138 Patient's unintentional underdosing of medication regimen for other reason: Secondary | ICD-10-CM

## 2018-02-20 DIAGNOSIS — J069 Acute upper respiratory infection, unspecified: Secondary | ICD-10-CM | POA: Diagnosis not present

## 2018-02-20 DIAGNOSIS — E119 Type 2 diabetes mellitus without complications: Secondary | ICD-10-CM | POA: Diagnosis not present

## 2018-02-20 DIAGNOSIS — R071 Chest pain on breathing: Secondary | ICD-10-CM

## 2018-02-20 DIAGNOSIS — T461X6A Underdosing of calcium-channel blockers, initial encounter: Secondary | ICD-10-CM | POA: Diagnosis present

## 2018-02-20 DIAGNOSIS — I5031 Acute diastolic (congestive) heart failure: Secondary | ICD-10-CM | POA: Diagnosis not present

## 2018-02-20 DIAGNOSIS — I161 Hypertensive emergency: Secondary | ICD-10-CM | POA: Diagnosis not present

## 2018-02-20 DIAGNOSIS — T502X6A Underdosing of carbonic-anhydrase inhibitors, benzothiadiazides and other diuretics, initial encounter: Secondary | ICD-10-CM | POA: Diagnosis present

## 2018-02-20 DIAGNOSIS — I110 Hypertensive heart disease with heart failure: Secondary | ICD-10-CM | POA: Diagnosis present

## 2018-02-20 DIAGNOSIS — I160 Hypertensive urgency: Secondary | ICD-10-CM | POA: Diagnosis present

## 2018-02-20 DIAGNOSIS — N179 Acute kidney failure, unspecified: Secondary | ICD-10-CM | POA: Diagnosis not present

## 2018-02-20 DIAGNOSIS — R079 Chest pain, unspecified: Secondary | ICD-10-CM | POA: Diagnosis present

## 2018-02-20 DIAGNOSIS — E1169 Type 2 diabetes mellitus with other specified complication: Secondary | ICD-10-CM | POA: Diagnosis not present

## 2018-02-20 DIAGNOSIS — G4733 Obstructive sleep apnea (adult) (pediatric): Secondary | ICD-10-CM | POA: Diagnosis present

## 2018-02-20 DIAGNOSIS — E1165 Type 2 diabetes mellitus with hyperglycemia: Secondary | ICD-10-CM | POA: Diagnosis present

## 2018-02-20 DIAGNOSIS — T465X6A Underdosing of other antihypertensive drugs, initial encounter: Secondary | ICD-10-CM | POA: Diagnosis present

## 2018-02-20 DIAGNOSIS — R05 Cough: Secondary | ICD-10-CM | POA: Diagnosis not present

## 2018-02-20 DIAGNOSIS — Z6841 Body Mass Index (BMI) 40.0 and over, adult: Secondary | ICD-10-CM

## 2018-02-20 DIAGNOSIS — Z7722 Contact with and (suspected) exposure to environmental tobacco smoke (acute) (chronic): Secondary | ICD-10-CM | POA: Diagnosis not present

## 2018-02-20 DIAGNOSIS — E1159 Type 2 diabetes mellitus with other circulatory complications: Secondary | ICD-10-CM | POA: Diagnosis not present

## 2018-02-20 DIAGNOSIS — I5033 Acute on chronic diastolic (congestive) heart failure: Secondary | ICD-10-CM | POA: Diagnosis present

## 2018-02-20 DIAGNOSIS — R739 Hyperglycemia, unspecified: Secondary | ICD-10-CM | POA: Diagnosis present

## 2018-02-20 DIAGNOSIS — I340 Nonrheumatic mitral (valve) insufficiency: Secondary | ICD-10-CM | POA: Diagnosis not present

## 2018-02-20 DIAGNOSIS — R0602 Shortness of breath: Secondary | ICD-10-CM | POA: Diagnosis present

## 2018-02-20 LAB — BASIC METABOLIC PANEL
ANION GAP: 11 (ref 5–15)
BUN: 16 mg/dL (ref 6–20)
CHLORIDE: 104 mmol/L (ref 101–111)
CO2: 23 mmol/L (ref 22–32)
CREATININE: 1.03 mg/dL — AB (ref 0.44–1.00)
Calcium: 8.4 mg/dL — ABNORMAL LOW (ref 8.9–10.3)
GFR calc non Af Amer: 55 mL/min — ABNORMAL LOW (ref >60)
Glucose, Bld: 128 mg/dL — ABNORMAL HIGH (ref 65–99)
POTASSIUM: 3.2 mmol/L — AB (ref 3.5–5.1)
SODIUM: 138 mmol/L (ref 135–145)

## 2018-02-20 LAB — CBC WITH DIFFERENTIAL/PLATELET
BASOS ABS: 0 10*3/uL (ref 0.0–0.1)
BASOS PCT: 1 %
EOS ABS: 0.5 10*3/uL (ref 0.0–0.7)
Eosinophils Relative: 6 %
HCT: 36.3 % (ref 36.0–46.0)
HEMOGLOBIN: 12.6 g/dL (ref 12.0–15.0)
Lymphocytes Relative: 40 %
Lymphs Abs: 3.2 10*3/uL (ref 0.7–4.0)
MCH: 30.2 pg (ref 26.0–34.0)
MCHC: 34.7 g/dL (ref 30.0–36.0)
MCV: 87.1 fL (ref 78.0–100.0)
Monocytes Absolute: 0.6 10*3/uL (ref 0.1–1.0)
Monocytes Relative: 7 %
NEUTROS ABS: 3.9 10*3/uL (ref 1.7–7.7)
NEUTROS PCT: 46 %
Platelets: 297 10*3/uL (ref 150–400)
RBC: 4.17 MIL/uL (ref 3.87–5.11)
RDW: 14.7 % (ref 11.5–15.5)
WBC: 8.2 10*3/uL (ref 4.0–10.5)

## 2018-02-20 LAB — BRAIN NATRIURETIC PEPTIDE: B NATRIURETIC PEPTIDE 5: 87.4 pg/mL (ref 0.0–100.0)

## 2018-02-20 LAB — TROPONIN I
Troponin I: <0.03 ng/mL (ref <0.03)
Troponin I: <0.03 ng/mL (ref <0.03)

## 2018-02-20 LAB — GLUCOSE, CAPILLARY
GLUCOSE-CAPILLARY: 123 mg/dL — AB (ref 65–99)
Glucose-Capillary: 145 mg/dL — ABNORMAL HIGH (ref 65–99)
Glucose-Capillary: 110 mg/dL — ABNORMAL HIGH (ref 65–99)

## 2018-02-20 LAB — HEMOGLOBIN A1C
Hgb A1c MFr Bld: 6.4 % — ABNORMAL HIGH (ref 4.8–5.6)
Mean Plasma Glucose: 136.98 mg/dL

## 2018-02-20 LAB — TSH: TSH: 4.353 u[IU]/mL (ref 0.350–4.500)

## 2018-02-20 LAB — PROCALCITONIN: Procalcitonin: <0.1 ng/mL

## 2018-02-20 MED ORDER — GI COCKTAIL ~~LOC~~
30.0000 mL | Freq: Four times a day (QID) | ORAL | Status: DC | PRN
  Administered 2018-02-23: 30 mL via ORAL
  Filled 2018-02-20: qty 30

## 2018-02-20 MED ORDER — ALBUTEROL SULFATE (2.5 MG/3ML) 0.083% IN NEBU
2.5000 mg | INHALATION_SOLUTION | RESPIRATORY_TRACT | Status: DC | PRN
  Administered 2018-02-20 – 2018-02-21 (×2): 2.5 mg via RESPIRATORY_TRACT
  Filled 2018-02-20 (×2): qty 3

## 2018-02-20 MED ORDER — ALBUTEROL SULFATE (2.5 MG/3ML) 0.083% IN NEBU
INHALATION_SOLUTION | RESPIRATORY_TRACT | Status: AC
  Administered 2018-02-20: 2.5 mg
  Filled 2018-02-20: qty 3

## 2018-02-20 MED ORDER — CLOPIDOGREL BISULFATE 75 MG PO TABS
75.0000 mg | ORAL_TABLET | Freq: Every day | ORAL | Status: DC
  Administered 2018-02-20 – 2018-02-24 (×5): 75 mg via ORAL
  Filled 2018-02-20 (×5): qty 1

## 2018-02-20 MED ORDER — LORATADINE 10 MG PO TABS
10.0000 mg | ORAL_TABLET | Freq: Every day | ORAL | Status: DC
  Administered 2018-02-21 – 2018-02-24 (×4): 10 mg via ORAL
  Filled 2018-02-20 (×5): qty 1

## 2018-02-20 MED ORDER — IPRATROPIUM-ALBUTEROL 0.5-2.5 (3) MG/3ML IN SOLN
3.0000 mL | Freq: Once | RESPIRATORY_TRACT | Status: AC
  Administered 2018-02-20: 3 mL via RESPIRATORY_TRACT
  Filled 2018-02-20: qty 3

## 2018-02-20 MED ORDER — METOPROLOL SUCCINATE ER 25 MG PO TB24
25.0000 mg | ORAL_TABLET | Freq: Every day | ORAL | Status: DC
  Filled 2018-02-20: qty 1

## 2018-02-20 MED ORDER — ACETAMINOPHEN 325 MG PO TABS: 650.0000 mg | ORAL_TABLET | ORAL | Status: DC | PRN

## 2018-02-20 MED ORDER — AMLODIPINE BESYLATE 10 MG PO TABS: 10.0000 mg | ORAL_TABLET | Freq: Every day | ORAL | Status: DC

## 2018-02-20 MED ORDER — ASPIRIN 81 MG PO CHEW
324.0000 mg | CHEWABLE_TABLET | Freq: Once | ORAL | Status: AC
  Administered 2018-02-20: 324 mg via ORAL
  Filled 2018-02-20: qty 4

## 2018-02-20 MED ORDER — AMLODIPINE BESYLATE 5 MG PO TABS
10.0000 mg | ORAL_TABLET | Freq: Once | ORAL | Status: AC
  Administered 2018-02-20: 10 mg via ORAL
  Filled 2018-02-20: qty 2

## 2018-02-20 MED ORDER — TRAMADOL HCL 50 MG PO TABS
50.0000 mg | ORAL_TABLET | Freq: Four times a day (QID) | ORAL | Status: DC | PRN
  Administered 2018-02-20: 50 mg via ORAL
  Filled 2018-02-20 (×2): qty 1

## 2018-02-20 MED ORDER — METOPROLOL SUCCINATE ER 25 MG PO TB24
25.0000 mg | ORAL_TABLET | Freq: Once | ORAL | Status: DC
  Filled 2018-02-20: qty 1

## 2018-02-20 MED ORDER — IPRATROPIUM-ALBUTEROL 0.5-2.5 (3) MG/3ML IN SOLN
3.0000 mL | Freq: Four times a day (QID) | RESPIRATORY_TRACT | Status: DC
  Administered 2018-02-20 – 2018-02-23 (×12): 3 mL via RESPIRATORY_TRACT
  Filled 2018-02-20 (×12): qty 3

## 2018-02-20 MED ORDER — FUROSEMIDE 10 MG/ML IJ SOLN
40.0000 mg | Freq: Once | INTRAMUSCULAR | Status: AC
  Administered 2018-02-20: 40 mg via INTRAVENOUS
  Filled 2018-02-20: qty 4

## 2018-02-20 MED ORDER — HYDRALAZINE HCL 20 MG/ML IJ SOLN
10.0000 mg | Freq: Once | INTRAMUSCULAR | Status: AC
  Administered 2018-02-20: 10 mg via INTRAVENOUS
  Filled 2018-02-20: qty 1

## 2018-02-20 MED ORDER — NITROGLYCERIN 2 % TD OINT
1.0000 [in_us] | TOPICAL_OINTMENT | Freq: Once | TRANSDERMAL | Status: AC
  Administered 2018-02-20: 1 [in_us] via TOPICAL
  Filled 2018-02-20: qty 1

## 2018-02-20 MED ORDER — DM-GUAIFENESIN ER 30-600 MG PO TB12
1.0000 | ORAL_TABLET | Freq: Two times a day (BID) | ORAL | Status: DC
  Administered 2018-02-20 – 2018-02-24 (×8): 1 via ORAL
  Filled 2018-02-20 (×8): qty 1

## 2018-02-20 MED ORDER — HYDROCHLOROTHIAZIDE 25 MG PO TABS
25.0000 mg | ORAL_TABLET | Freq: Once | ORAL | Status: AC
  Administered 2018-02-20: 25 mg via ORAL
  Filled 2018-02-20: qty 1

## 2018-02-20 MED ORDER — ATORVASTATIN CALCIUM 80 MG PO TABS
80.0000 mg | ORAL_TABLET | Freq: Every day | ORAL | Status: DC
  Administered 2018-02-20 – 2018-02-23 (×4): 80 mg via ORAL
  Filled 2018-02-20 (×4): qty 1

## 2018-02-20 MED ORDER — HYDRALAZINE HCL 20 MG/ML IJ SOLN
5.0000 mg | INTRAMUSCULAR | Status: DC | PRN
  Administered 2018-02-20: 5 mg via INTRAVENOUS
  Filled 2018-02-20: qty 1

## 2018-02-20 MED ORDER — NITROGLYCERIN 0.4 MG SL SUBL: 0.4000 mg | SUBLINGUAL_TABLET | SUBLINGUAL | Status: DC | PRN

## 2018-02-20 MED ORDER — ASPIRIN EC 81 MG PO TBEC
81.0000 mg | DELAYED_RELEASE_TABLET | Freq: Every day | ORAL | Status: DC
  Administered 2018-02-20 – 2018-02-24 (×5): 81 mg via ORAL
  Filled 2018-02-20 (×5): qty 1

## 2018-02-20 MED ORDER — CLONIDINE HCL 0.3 MG PO TABS
0.3000 mg | ORAL_TABLET | Freq: Two times a day (BID) | ORAL | Status: DC
  Administered 2018-02-20: 0.3 mg via ORAL
  Filled 2018-02-20: qty 1

## 2018-02-20 MED ORDER — SODIUM CHLORIDE 0.9 % IV SOLN
500.0000 mg | Freq: Once | INTRAVENOUS | Status: AC
  Administered 2018-02-20: 500 mg via INTRAVENOUS
  Filled 2018-02-20: qty 500

## 2018-02-20 MED ORDER — IRBESARTAN 300 MG PO TABS
150.0000 mg | ORAL_TABLET | Freq: Every day | ORAL | Status: DC
  Administered 2018-02-20: 150 mg via ORAL
  Filled 2018-02-20: qty 1

## 2018-02-20 MED ORDER — PREDNISONE 10 MG PO TABS
10.0000 mg | ORAL_TABLET | Freq: Every day | ORAL | Status: DC
  Administered 2018-02-21: 10 mg via ORAL
  Filled 2018-02-20: qty 1

## 2018-02-20 MED ORDER — MORPHINE SULFATE (PF) 2 MG/ML IV SOLN: 2.0000 mg | INTRAVENOUS | Status: DC | PRN

## 2018-02-20 MED ORDER — METOPROLOL TARTRATE 50 MG PO TABS
ORAL_TABLET | ORAL | Status: AC
  Filled 2018-02-20: qty 1

## 2018-02-20 MED ORDER — CLONIDINE HCL 0.1 MG PO TABS
0.3000 mg | ORAL_TABLET | Freq: Once | ORAL | Status: AC
  Administered 2018-02-20: 0.3 mg via ORAL
  Filled 2018-02-20: qty 3

## 2018-02-20 MED ORDER — GUAIFENESIN-DM 100-10 MG/5ML PO SYRP
5.0000 mL | ORAL_SOLUTION | ORAL | Status: DC | PRN
  Administered 2018-02-21: 5 mL via ORAL
  Filled 2018-02-20: qty 5

## 2018-02-20 MED ORDER — METOPROLOL TARTRATE 50 MG PO TABS
25.0000 mg | ORAL_TABLET | Freq: Once | ORAL | Status: AC
  Administered 2018-02-20: 25 mg via ORAL

## 2018-02-20 MED ORDER — AZITHROMYCIN 500 MG IV SOLR
INTRAVENOUS | Status: AC
  Filled 2018-02-20: qty 500

## 2018-02-20 MED ORDER — ENOXAPARIN SODIUM 40 MG/0.4ML ~~LOC~~ SOLN
40.0000 mg | SUBCUTANEOUS | Status: DC
  Administered 2018-02-20 – 2018-02-23 (×4): 40 mg via SUBCUTANEOUS
  Filled 2018-02-20 (×4): qty 0.4

## 2018-02-20 MED ORDER — POTASSIUM CHLORIDE CRYS ER 10 MEQ PO TBCR
10.0000 meq | EXTENDED_RELEASE_TABLET | Freq: Two times a day (BID) | ORAL | Status: DC
  Administered 2018-02-20 – 2018-02-22 (×4): 10 meq via ORAL
  Filled 2018-02-20 (×9): qty 1

## 2018-02-20 MED ORDER — INSULIN ASPART 100 UNIT/ML ~~LOC~~ SOLN
0.0000 [IU] | Freq: Three times a day (TID) | SUBCUTANEOUS | Status: DC
  Administered 2018-02-21 (×3): 3 [IU] via SUBCUTANEOUS
  Administered 2018-02-22: 7 [IU] via SUBCUTANEOUS
  Administered 2018-02-22: 4 [IU] via SUBCUTANEOUS
  Administered 2018-02-22: 3 [IU] via SUBCUTANEOUS
  Administered 2018-02-23 – 2018-02-24 (×2): 4 [IU] via SUBCUTANEOUS

## 2018-02-20 MED ORDER — ALBUTEROL SULFATE (2.5 MG/3ML) 0.083% IN NEBU
5.0000 mg | INHALATION_SOLUTION | Freq: Once | RESPIRATORY_TRACT | Status: AC
  Administered 2018-02-20: 5 mg via RESPIRATORY_TRACT

## 2018-02-20 MED ORDER — ONDANSETRON HCL 4 MG/2ML IJ SOLN: 4.0000 mg | Freq: Four times a day (QID) | INTRAMUSCULAR | Status: DC | PRN

## 2018-02-20 MED ORDER — HYDROCHLOROTHIAZIDE 25 MG PO TABS
25.0000 mg | ORAL_TABLET | Freq: Every day | ORAL | Status: DC
  Filled 2018-02-20: qty 1

## 2018-02-20 NOTE — H&P (Signed)
History and Physical    Sherry Fitzpatrick ZOX:[id number] DOB: 09-30-1951 DOA: 02/20/2018  PCP:  Recently moved to [place], [place]; does not have a PCP - planning to go back home on Saturday or Monday Consultants:  Alvan Dame - orthopedics Patient coming from:  Home - lives with cousin; Donald Prose:  Daughters, [phone]  Chief Complaint: SOB  HPI: Sherry Fitzpatrick is a 67 y.o. female with medical history significant of HTN; CVA; OSA; HLD; and abnormal glucose presenting with chest pain.   She has "had a cold forever, about 3-4 months".  She saw the doctor 2-3 times.  It got better and she thought it was gone.  Last week, she started coughing, noticed that her throat was dry, felt SOB.  Since symptoms weren't there all the time, she thought it was related to pollen.  Yesterday, she felt bad.  She laid down early and awoke this AM about 230 with gasping for breath.  She sat up to prop the pillows, thinking this would help, but she kept coughing.  Finally, she got up and came to the ER.  She felt bilateral chest tightness with the SOB, but not substernal chest pain.  +wheezing.  No sick contacts. Cough is productive of yellowish sputum.  She had been taking 5 BP medications - she has a few Clonidine and Metoprolol left. She ran out of amlodipine, Diovan, HCTZ and Lipitor about 3 weeks ago.     ED Course:  She lives in [place], [place] - here visiting family.  CP, SOB, wheezing.  BPs in 250s.  Out of 3/5 BP medications for about 3 weeks. Pulmonary edema (vs atypical PNA) on CXR.  Recommend ACS r/o (heart score 5) and possible hypertensive urgency.  BP after medications is 195/76, 160/70s now after NTG paste.  Given Lasix x 1  Review of Systems: As per HPI; otherwise review of systems reviewed and negative.   Ambulatory Status:  Ambulates without assistance  Past Medical History:  Diagnosis Date  . Alterations of sensations, late effect of cerebrovascular disease(438.6)   . Arthritis    "all over"  . Backache, unspecified    . Body mass index 40.0-44.9, adult ([facility])   . Carpal tunnel syndrome   . Dizziness and giddiness   . Esophageal reflux   . Generalized pain   . Headache    "had alot til I had pituitary tumor removed"  . Heart murmur   . Other abnormal glucose   . Other and unspecified hyperlipidemia   . Other malaise and fatigue   . Other postprocedural status(V45.89)   . Other symptoms involving cardiovascular system   . Sleep apnea    "did test; they wanted me to get a CPAP but I never did get it" (04/14/2015)  . Stroke ([facility]) 06/2003; 02/2010   "minor; minor" ; denies residual on 04/14/2015  . Unspecified disorder of the pituitary gland and its hypothalamic control   . Unspecified essential hypertension   . Unspecified visual disturbance     Past Surgical History:  Procedure Laterality Date  . CARPAL TUNNEL RELEASE Left ~ 2014  . JOINT REPLACEMENT    . TOTAL HIP ARTHROPLASTY Right 11/06/2010  . TRANSPHENOIDAL / TRANSNASAL HYPOPHYSECTOMY / RESECTION PITUITARY TUMOR  06/13/2012  . TUBAL LIGATION  1974    Social History   Socioeconomic History  . Marital status: Divorced    Spouse name: Not on file  . Number of children: Not on file  . Years of education: Not on file  .  Highest education level: Not on file  Occupational History  . Occupation: unemployed  Social Needs  . Financial resource strain: Not on file  . Food insecurity:    Worry: Not on file    Inability: Not on file  . Transportation needs:    Medical: Not on file    Non-medical: Not on file  Tobacco Use  . Smoking status: Current Some Day Smoker  . Smokeless tobacco: Never Used  . Tobacco comment: smokes every now and then  Substance and Sexual Activity  . Alcohol use: Yes    Alcohol/week: 0.0 oz    Comment: rarely; "I don't drink.  But I like it."  . Drug use: No  . Sexual activity: Yes  Lifestyle  . Physical activity:    Days per week: Not on file    Minutes per session: Not on file  . Stress: Not on file    Relationships  . Social connections:    Talks on phone: Not on file    Gets together: Not on file    Attends religious service: Not on file    Active member of club or organization: Not on file    Attends meetings of clubs or organizations: Not on file    Relationship status: Not on file  . Intimate partner violence:    Fear of current or ex partner: Not on file    Emotionally abused: Not on file    Physically abused: Not on file    Forced sexual activity: Not on file  Other Topics Concern  . Not on file  Social History Narrative  . Not on file    Allergies  Allergen Reactions  . Penicillins Hives    Has patient had a PCN reaction causing immediate rash, facial/tongue/throat swelling, SOB or lightheadedness with hypotension: YES Has patient had a PCN reaction causing severe rash involving mucus membranes or skin necrosis: NO Has patient had a PCN reaction that required hospitalization NO Has patient had a PCN reaction occurring within the last 10 years: NO If all of the above answers are "NO", then may proceed with Cephalosporin use.  Marland Kitchen Percocet [Oxycodone-Acetaminophen] Nausea And Vomiting    Family History  Problem Relation Age of Onset  . Heart failure Mother   . Hypertension Mother   . Diabetes Father   . Hypertension Sister   . Heart failure Sister   . Hypertension Daughter     Prior to Admission medications   Medication Sig Start Date End Date Taking? Authorizing Provider  albuterol (PROVENTIL HFA;VENTOLIN HFA) 108 (90 BASE) MCG/ACT inhaler Inhale 2 puffs into the lungs every 6 (six) hours as needed for wheezing.    [provider]  amLODipine (NORVASC) 10 MG tablet Take 1 tablet (10 mg total) by mouth daily. 04/30/16   Virgel Manifold, MD  atorvastatin (LIPITOR) 80 MG tablet Take 80 mg by mouth daily.    [provider]  cloNIDine (CATAPRES) 0.3 MG tablet Take 0.3 mg by mouth 2 (two) times daily.    [provider]  clopidogrel (PLAVIX)  75 MG tablet Take 75 mg by mouth daily.    [provider]  dextromethorphan-guaiFENesin (MUCINEX DM) 30-600 MG 12hr tablet Take 1 tablet by mouth 2 (two) times daily. 12/21/17   Fredia Sorrow, MD  fluticasone (FLOVENT DISKUS) 50 MCG/BLIST diskus inhaler Inhale 1 puff into the lungs 2 (two) times daily as needed (sob).     [provider]  hydrochlorothiazide (HYDRODIURIL) 25 MG tablet Take  25 mg by mouth daily.    [provider]  loratadine (CLARITIN) 10 MG tablet Take 10 mg by mouth daily.    [provider]  metoprolol succinate (TOPROL-XL) 25 MG 24 hr tablet Take 25 mg by mouth daily.    [provider]  nitroGLYCERIN (NITROSTAT) 0.4 MG SL tablet Place 1 tablet (0.4 mg total) under the tongue every 5 (five) minutes as needed for chest pain (hold for SBP < 110). 04/16/15   Rai, Ripudeep K, MD  potassium chloride (K-DUR) 10 MEQ tablet Take 1 tablet (10 mEq total) by mouth 2 (two) times daily. 12/21/17   Fredia Sorrow, MD  potassium chloride (K-DUR,KLOR-CON) 10 MEQ tablet Take 10 mEq by mouth daily.    [provider]  traMADol (ULTRAM) 50 MG tablet Take 50 mg by mouth every 6 (six) hours as needed for pain. Reported on 02/09/2016    [provider]  valsartan (DIOVAN) 160 MG tablet Take 160 mg by mouth daily.    [provider]    Physical Exam: Vitals:   02/20/18 0756 02/20/18 0816 02/20/18 0830 02/20/18 1043  BP:  (!) 152/72 (!) 161/86 (!) 118/107  Pulse:  60 63 70  Resp:  (!) 21 (!) 26   Temp:      TempSrc:      SpO2: 100% 99% 97% 100%  Weight:      Height:         General:  Appears calm and comfortable and is NAD; intermittent paroxysms of coughing with productive sputum Eyes:  EOMI, normal lids, iris ENT:  grossly normal hearing, lips & tongue, mmm Neck:  no LAD, masses or thyromegaly Cardiovascular:  RRR, no m/r/g. No LE edema.  Respiratory:  Scattered expiratory wheezes.  Normal respiratory  effort. Abdomen:  soft, NT, ND, NABS Back:   normal alignment, no CVAT Skin:  no rash or induration seen on limited exam Musculoskeletal:  grossly normal tone BUE/BLE, good ROM, no bony abnormality Lower extremity:  No LE edema.  Limited foot exam with no ulcerations.  2+ distal pulses. Psychiatric:  grossly normal mood and affect, speech fluent and appropriate, AOx3 Neurologic:  CN 2-12 grossly intact, moves all extremities in coordinated fashion, sensation intact    Radiological Exams on Admission: Dg Chest 2 View  Result Date: 02/20/2018 CLINICAL DATA:  Chest pain and cough EXAM: CHEST - 2 VIEW COMPARISON:  12/21/2017 FINDINGS: Cardiomegaly. Diffuse interstitial coarsening with fissure thickening, new from prior. No focal opacity or effusion. Stable mediastinal contours. IMPRESSION: 1. Generalized increase in interstitial coarsening, CHF or atypical infection could give this appearance. 2. Chronic cardiomegaly. Electronically Signed   By: Monte Fantasia M.D.   On: 02/20/2018 07:32    EKG: Independently reviewed.  NSR with rate 76; nonspecific ST changes with no evidence of acute ischemia   Labs on Admission: I have personally reviewed the available labs and imaging studies at the time of the admission.  Pertinent labs:   K+ 3.2 Glucose 128 Essentially normal/stable BMP Normal CBC BNP 87.4 Troponin <0.03  Assessment/Plan Principal Problem:   Malignant hypertension Active Problems:   Chest pain   Hyperlipidemia   Body mass index 40.0-44.9, adult ([facility])   Blood glucose elevated   Shortness of breath   Malignant HTN -Patient with long-standing severe HTN requiring 5 agents for control -She ran out of 3 of her medications about 3 weeks ago -On presentation, her BP was 251/89 -Her home medications were resumed in the ER and  her BP is markedly improved -Will continue home medications -Will also add prn hydralazine  Chest pain -Suspect that this was related to hypertensive  urgency  -CXR unremarkable.   -Initial cardiac troponin negative.  -EKG not indicative of acute ischemia.   -Will plan to place in observation status on telemetry to rule out ACS by overnight observation.  -cycle troponin q6h x 3 and repeat EKG in AM -Start ASA 81 mg  daily -morphine given -Risk factor stratification with HgbA1c and FLP; will also check TSH   HLD -Resume Lipitor -Check lipids  Hyperglycemia -May be stress response -Will follow with fasting AM labs and check A1c -There is no indication to start medication at this time, but will cover with SSI for now  Cough/SOB -She does have a recurrent percussive cough -CXR with interstitial coarse thickening - possible CHF or atypical infection -Normal BNP, making CHF less likely; she was given 1 dose of Lasix in the ER and will not repeat at this time -Also normal CBC, so lower suspicion for bacterial cause -No long-term smoking history and so this may be an asthma variant  -Will treat with standing Duonebs, prn Albuterol, and PO prednisone for now and continue to follow -Will also check respiratory virus panel and procalcitonin  Obesity -Likely with underlying OSA and this may be contributing to wheezing -Will request dietary consult  Other -Patient recently moved to [place], [place] and needs PCP -CM consult requested for assistance, ideally would need f/u appt next week  DVT prophylaxis:  Lovenox Code Status:  Full - confirmed with patient Family Communication: None present Disposition Plan:  Home once clinically improved Consults called: CM, Nutrition  Admission status: It is my clinical opinion that referral for OBSERVATION is reasonable and necessary in this patient based on the above information provided. The aforementioned taken together are felt to place the patient at high risk for further clinical deterioration. However it is anticipated that the patient may be medically stable for discharge from the hospital within  24 to 48 hours.    Karmen Bongo MD [facility]  If note is complete, please contact covering daytime or nighttime physician. [email or web address] Password [facility]  02/20/2018, 12:38 PM

## 2018-02-20 NOTE — Progress Notes (Signed)
Placed patient on CPAP for the night via auto-mode with minimum pressure set at 8cm and maxiumum pressure set at 20cm.

## 2018-02-20 NOTE — Care Management Note (Signed)
Case Management Note  Patient Details  Name: Sherry Fitzpatrick MRN: [id number] Date of Birth: [birth year]  Subjective/Objective:    Malignant HTN               Action/Plan: Patient lives in [place] in [place] visiting family members for mother's day weekend; PCP - patient stated that she has seen 2 physicians for primary care and did not like them and plans to see another physician when she returns home; she plans to call her insurance for a list of providers in her area that will accept her insurance; Pharmacy of choice is [facility]; she is independent of all of her ADL's; CM will continue to follow for progression of care.  Expected Discharge Date:    possibly 02/24/2018              Expected Discharge Plan:   Home/ self care  Status of Service:   In progress  Sherrilyn Rist [phone] 02/20/2018, 3:03 PM

## 2018-02-20 NOTE — Progress Notes (Signed)
Patient is a 67yo female presenting to [facility] for chest pain.  PMH: HTN; CVA; OSA; HLD; and abnormal glucose.  She lives in [place], [place] - here visiting family.  CP, SOB, wheezing.  BPs in 250s.  Out of 3/5 BP medications for about 3 weeks. Pulmonary edema (vs atypical PNA) on CXR.  Recommend ACS r/o (heart score 5) and possible hypertensive urgency.  BP after medications is 195/76, 160/70s now after NTG paste.  Will place in obs tele.  Carlyon Shadow, M.D.

## 2018-02-20 NOTE — ED Provider Notes (Addendum)
TIME SEEN: 6:12 AM  CHIEF COMPLAINT: Chest pain, shortness of breath  HPI: Patient is a 67 year old female with history of hypertension, hyperlipidemia, obesity, previous strokes on Plavix who presents to the emergency department with intermittent diffuse chest tightness for the past 2 to 3 days that worsened last night with shortness of breath that woke her from sleep at 3 AM.  No aggravating or relieving factors.  Denies history of asthma, COPD but is a smoker.  No history of CHF.  States that her blood pressure is normally in the 235 systolic.  She is on Diovan, Norvasc, HCTZ, clonidine and Toprol and reports compliance.  Did not take any of her home blood pressure medications today.  States she recently moved to [facility] and does not have a local primary care physician there.  She is here visiting her newly born granddaughter.  No lower extremity swelling or pain.  States her last stress test was 15 years ago.  No history of cardiac catheterization.  ROS: See HPI Constitutional: no fever  Eyes: no drainage  ENT: no runny nose   Cardiovascular:   chest pain  Resp:  SOB  GI: no vomiting GU: no dysuria Integumentary: no rash  Allergy: no hives  Musculoskeletal: no leg swelling  Neurological: no slurred speech ROS otherwise negative  PAST MEDICAL HISTORY/PAST SURGICAL HISTORY:  Past Medical History:  Diagnosis Date  . Alterations of sensations, late effect of cerebrovascular disease(438.6)   . Arthritis    "all over"  . Backache, unspecified   . Body mass index 40.0-44.9, adult ([facility])   . Carpal tunnel syndrome   . Dizziness and giddiness   . Esophageal reflux   . Generalized pain   . Headache    "had alot til I had pituitary tumor removed"  . Heart murmur   . Other abnormal glucose   . Other and unspecified hyperlipidemia   . Other malaise and fatigue   . Other postprocedural status(V45.89)   . Other symptoms involving cardiovascular system   . Sleep apnea    "did test; they wanted me to get a CPAP but I never did get it" (04/14/2015  . Stroke ([facility]) 06/2003; 02/2010   "minor; minor" ; denies residual on 04/14/2015  . Unspecified disorder of the pituitary gland and its hypothalamic control   . Unspecified essential hypertension   . Unspecified visual disturbance     MEDICATIONS:  Prior to Admission medications   Medication Sig Start Date End Date Taking? Authorizing Provider  albuterol (PROVENTIL HFA;VENTOLIN HFA) 108 (90 BASE) MCG/ACT inhaler Inhale 2 puffs into the lungs every 6 (six) hours as needed for wheezing.    [provider]  amLODipine (NORVASC) 10 MG tablet Take 1 tablet (10 mg total) by mouth daily. 04/30/16   Virgel Manifold, MD  atorvastatin (LIPITOR) 80 MG tablet Take 80 mg by mouth daily.    [provider]  cloNIDine (CATAPRES) 0.3 MG tablet Take 0.3 mg by mouth 2 (two) times daily.    [provider]  clopidogrel (PLAVIX) 75 MG tablet Take 75 mg by mouth daily.    [provider]  dextromethorphan-guaiFENesin (MUCINEX DM) 30-600 MG 12hr tablet Take 1 tablet by mouth 2 (two) times daily. 12/21/17   Fredia Sorrow, MD  fluticasone (FLOVENT DISKUS) 50 MCG/BLIST diskus inhaler Inhale 1 puff into the lungs 2 (two) times daily as needed (sob).     [provider]  hydrochlorothiazide (HYDRODIURIL) 25 MG tablet Take 25 mg by mouth  daily.    [provider]  loratadine (CLARITIN) 10 MG tablet Take 10 mg by mouth daily.    [provider]  metoprolol succinate (TOPROL-XL) 25 MG 24 hr tablet Take 25 mg by mouth daily.    [provider]  nitroGLYCERIN (NITROSTAT) 0.4 MG SL tablet Place 1 tablet (0.4 mg total) under the tongue every 5 (five) minutes as needed for chest pain (hold for SBP < 110). 04/16/15   Rai, Ripudeep K, MD  potassium chloride (K-DUR) 10 MEQ tablet Take 1 tablet (10 mEq total) by mouth 2 (two) times daily. 12/21/17   Fredia Sorrow, MD  potassium chloride  (K-DUR,KLOR-CON) 10 MEQ tablet Take 10 mEq by mouth daily.    [provider]  traMADol (ULTRAM) 50 MG tablet Take 50 mg by mouth every 6 (six) hours as needed for pain. Reported on 02/09/2016    [provider]  valsartan (DIOVAN) 160 MG tablet Take 160 mg by mouth daily.    [provider]    ALLERGIES:  Allergies  Allergen Reactions  . Penicillins Hives    Has patient had a PCN reaction causing immediate rash, facial/tongue/throat swelling, SOB or lightheadedness with hypotension: YES Has patient had a PCN reaction causing severe rash involving mucus membranes or skin necrosis: NO Has patient had a PCN reaction that required hospitalization NO Has patient had a PCN reaction occurring within the last 10 years: NO If all of the above answers are "NO", then may proceed with Cephalosporin use.  Marland Kitchen Percocet [Oxycodone-Acetaminophen] Nausea And Vomiting    SOCIAL HISTORY:  Social History   Tobacco Use  . Smoking status: Passive Smoke Exposure - Never Smoker  . Smokeless tobacco: Never Used  Substance Use Topics  . Alcohol use: Yes    Alcohol/week: 0.0 oz    Comment: 04/14/2015 "might have a glass of wine q 5-6 months, if that"    FAMILY HISTORY: Family History  Problem Relation Age of Onset  . Heart failure Mother   . Hypertension Mother   . Diabetes Father   . Hypertension Sister   . Heart failure Sister   . Hypertension Daughter     EXAM: BP (!) 251/89 (BP Location: Right Arm)   Pulse 69   Temp 98.2 F (36.8 C) (Oral)   Resp (!) 23   Ht 5\' 5"  (1.651 m)   Wt 113.4 kg (250 lb)   SpO2 99%   BMI 41.60 kg/m  CONSTITUTIONAL: Alert and oriented and responds appropriately to questions.  Obese, appears uncomfortable HEAD: Normocephalic EYES: Conjunctivae clear, pupils appear equal, EOMI ENT: normal nose; moist mucous membranes NECK: Supple, no meningismus, no nuchal rigidity, no LAD  CARD: RRR; S1 and S2 appreciated; no murmurs, no clicks, no  rubs, no gallops RESP: Normal chest excursion without splinting or tachypnea; breath sounds clear and equal bilaterally; no wheezes, no rhonchi, no rales, no hypoxia or respiratory distress, speaking full sentences ABD/GI: Normal bowel sounds; non-distended; soft, non-tender, no rebound, no guarding, no peritoneal signs, no hepatosplenomegaly BACK:  The back appears normal and is non-tender to palpation, there is no CVA tenderness EXT: Normal ROM in all joints; non-tender to palpation; no edema; normal capillary refill; no cyanosis, no calf tenderness or swelling    SKIN: Normal color for age and race; warm; no rash NEURO: Moves all extremities equally PSYCH: The patient's mood and manner are appropriate. Grooming and personal hygiene are appropriate.  MEDICAL DECISION MAKING: Patient here with chest pain or shortness  of breath.  Initially a wheezing per respiratory therapy and was given albuterol treatment which she states is helping somewhat.  Lungs currently now clear.  She is extremely hypertensive here which she states is near her baseline.  I have ordered some of her home blood pressure medications as well as IV hydralazine.  Will give aspirin, nitroglycerin as she has multiple risk factors for ACS.  No obvious sign of volume overload currently.  Will obtain cardiac labs, chest x-ray.  EKG shows no new ischemic changes.  Anticipate admission given multiple risk factors for ACS with intermittent chest pain and shortness of breath.  PE and dissection also on the differential at this time.  Less likely pneumonia.  She denies fever or productive cough.  ED PROGRESS: Labs unremarkable.  Troponin negative.  BNP normal.  Chest x-ray appears to show interstitial edema.  Will give IV Lasix.  Now more concerned for hypertensive emergency.  I feel she will need admission.  We will continue to monitor her blood pressure to see if she needs to be started on infusion.  Signed out to Dr. Leonette Monarch.   I reviewed  all nursing notes, vitals, pertinent previous records, EKGs, lab and urine results, imaging (as available).     EKG Interpretation  Date/Time:  Thursday Feb 20 2018 06:07:48 EDT Ventricular Rate:  76 PR Interval:    QRS Duration: 98 QT Interval:  412 QTC Calculation: 464 R Axis:   78 Text Interpretation:  Sinus rhythm Abnormal T, consider ischemia, diffuse leads No significant change since last tracing Confirmed by Pryor Curia [phone]) on 02/20/2018 6:25:37 AM        CRITICAL CARE Performed by: Cyril Mourning Ward   Total critical care time: 35 minutes  Critical care time was exclusive of separately billable procedures and treating other patients.  Critical care was necessary to treat or prevent imminent or life-threatening deterioration.  Critical care was time spent personally by me on the following activities: development of treatment plan with patient and/or surrogate as well as nursing, discussions with consultants, evaluation of patient's response to treatment, examination of patient, obtaining history from patient or surrogate, ordering and performing treatments and interventions, ordering and review of laboratory studies, ordering and review of radiographic studies, pulse oximetry and re-evaluation of patient's condition.    Ward, Delice Bison, DO 02/20/18 6160    Ward, Delice Bison, DO 02/20/18 7371

## 2018-02-20 NOTE — ED Provider Notes (Signed)
I assumed care of this patient from Dr. Leonides Schanz at 0700.  Please see their note for further details of Hx, PE.  Briefly patient is a 67 y.o. female who presented with chest pain shortness of breath in the setting of significantly elevated blood pressure with systolics of 624.  EKG without acute ischemic changes or evidence of pericarditis.  Chest x-ray with evidence of mild pulmonary edema versus atypical pneumonia.  Patient denies any other infectious symptoms other than a not productive cough.  She was provided with home medication for blood pressure, Lasix, nitroglycerin which did improve the patient's blood pressure.  She will require admission for continued management of hypertensive emergency, ACS rule out.   I spoke with Dr. Lorin Mercy who admitted the patient.    Fatima Blank, MD 02/20/18 1134

## 2018-02-20 NOTE — ED Triage Notes (Signed)
Pt c/o SOB since last night with 9/10 central cp. Pt denies any fever or chills, very labored breathing on triage.

## 2018-02-20 NOTE — Progress Notes (Addendum)
Nutrition Brief Note  RD consulted for assessment of nutritional needs/ status due to morbid obesity.   Wt Readings from Last 15 Encounters:  02/20/18 250 lb (113.4 kg)  12/21/17 250 lb (113.4 kg)  04/30/16 238 lb (108 kg)  04/16/15 244 lb 12.8 oz (111 kg)  05/06/13 241 lb (109.3 kg)   Sherry Fitzpatrick is a 67 y.o. female with medical history significant of HTN; CVA; OSA; HLD; and abnormal glucose presenting with chest pain.   Pt admitted with chest pain.  Spoke with RN prior to visit. Pt on a carb modified diet secondary to elevated blood sugars (likely related to steroid use.   Pt being administered breathing treatment at time of visit and mainly responded to close ended questions. Pt reports she generally consumes 2 meals per day (Breakfast: bacon, eggs, and cheese, Dinner: meat, starch, and vegetable). She denies any weight loss (noted slight weight gain, likely related to edema). Pt reports fair appetite; observed pt consumed 75% of lunch tray ("I ate it because I was hungry").   Nutrition-Focused physical exam completed. Findings are no fat depletion, no muscle depletion, and moderate edema.   Medications reviewed and include prednisone.   Last Hgb A1c: 6.4 (02/20/18). CBGS: 145 (inpatient orders for glycemic control are 0-20 units insulin aspart TID with meals). Suspect prednisone may be increasing blood sugar levels.   Body mass index is 41.6 kg/m. Patient meets criteria for extreme obesity, class II based on current BMI.   Current diet order is carb modified, patient is consuming approximately 75% of meals at this time. Labs and medications reviewed.   No nutrition interventions warranted at this time. If nutrition issues arise, please consult RD.   Lizza Huffaker A. Jimmye Norman, RD, LDN, CDE Pager: [phone] After hours Pager: [phone]

## 2018-02-21 ENCOUNTER — Other Ambulatory Visit (HOSPITAL_COMMUNITY): Payer: Medicare HMO

## 2018-02-21 DIAGNOSIS — I5033 Acute on chronic diastolic (congestive) heart failure: Secondary | ICD-10-CM | POA: Diagnosis not present

## 2018-02-21 DIAGNOSIS — B348 Other viral infections of unspecified site: Secondary | ICD-10-CM | POA: Diagnosis not present

## 2018-02-21 DIAGNOSIS — I161 Hypertensive emergency: Secondary | ICD-10-CM | POA: Diagnosis not present

## 2018-02-21 DIAGNOSIS — I10 Essential (primary) hypertension: Secondary | ICD-10-CM | POA: Diagnosis not present

## 2018-02-21 DIAGNOSIS — R079 Chest pain, unspecified: Secondary | ICD-10-CM | POA: Diagnosis not present

## 2018-02-21 DIAGNOSIS — Z6841 Body Mass Index (BMI) 40.0 and over, adult: Secondary | ICD-10-CM

## 2018-02-21 DIAGNOSIS — J441 Chronic obstructive pulmonary disease with (acute) exacerbation: Secondary | ICD-10-CM | POA: Diagnosis not present

## 2018-02-21 DIAGNOSIS — N179 Acute kidney failure, unspecified: Secondary | ICD-10-CM | POA: Diagnosis not present

## 2018-02-21 DIAGNOSIS — E1169 Type 2 diabetes mellitus with other specified complication: Secondary | ICD-10-CM

## 2018-02-21 LAB — RESPIRATORY PANEL BY PCR
ADENOVIRUS-RVPPCR: NOT DETECTED
Bordetella pertussis: NOT DETECTED
CORONAVIRUS 229E-RVPPCR: NOT DETECTED
CORONAVIRUS HKU1-RVPPCR: NOT DETECTED
CORONAVIRUS OC43-RVPPCR: NOT DETECTED
Chlamydophila pneumoniae: NOT DETECTED
Coronavirus NL63: NOT DETECTED
Influenza A: NOT DETECTED
Influenza B: NOT DETECTED
METAPNEUMOVIRUS-RVPPCR: NOT DETECTED
Mycoplasma pneumoniae: NOT DETECTED
PARAINFLUENZA VIRUS 1-RVPPCR: NOT DETECTED
Parainfluenza Virus 2: NOT DETECTED
Parainfluenza Virus 3: NOT DETECTED
Parainfluenza Virus 4: NOT DETECTED
Respiratory Syncytial Virus: NOT DETECTED
Rhinovirus / Enterovirus: DETECTED — AB

## 2018-02-21 LAB — GLUCOSE, CAPILLARY
GLUCOSE-CAPILLARY: 128 mg/dL — AB (ref 65–99)
GLUCOSE-CAPILLARY: 215 mg/dL — AB (ref 65–99)
Glucose-Capillary: 139 mg/dL — ABNORMAL HIGH (ref 65–99)
Glucose-Capillary: 150 mg/dL — ABNORMAL HIGH (ref 65–99)

## 2018-02-21 LAB — LIPID PANEL
CHOL/HDL RATIO: 5.4 ratio
CHOLESTEROL: 207 mg/dL — AB (ref 0–200)
HDL: 38 mg/dL — ABNORMAL LOW (ref >40)
LDL Cholesterol: 153 mg/dL — ABNORMAL HIGH (ref 0–99)
Triglycerides: 79 mg/dL (ref <150)
VLDL: 16 mg/dL (ref 0–40)

## 2018-02-21 LAB — TROPONIN I

## 2018-02-21 MED ORDER — METOPROLOL SUCCINATE ER 25 MG PO TB24
25.0000 mg | ORAL_TABLET | Freq: Every day | ORAL | Status: DC
  Administered 2018-02-21 – 2018-02-24 (×4): 25 mg via ORAL
  Filled 2018-02-21 (×4): qty 1

## 2018-02-21 MED ORDER — CLONIDINE HCL 0.3 MG PO TABS
0.3000 mg | ORAL_TABLET | Freq: Two times a day (BID) | ORAL | Status: DC
  Administered 2018-02-21 – 2018-02-24 (×7): 0.3 mg via ORAL
  Filled 2018-02-21 (×7): qty 1

## 2018-02-21 MED ORDER — AMLODIPINE BESYLATE 10 MG PO TABS
10.0000 mg | ORAL_TABLET | Freq: Every day | ORAL | Status: DC
  Administered 2018-02-21 – 2018-02-24 (×4): 10 mg via ORAL
  Filled 2018-02-21 (×4): qty 1

## 2018-02-21 MED ORDER — IRBESARTAN 300 MG PO TABS
150.0000 mg | ORAL_TABLET | Freq: Every day | ORAL | Status: DC
  Administered 2018-02-21 – 2018-02-22 (×2): 150 mg via ORAL
  Filled 2018-02-21 (×2): qty 1

## 2018-02-21 MED ORDER — HYDROCHLOROTHIAZIDE 25 MG PO TABS
25.0000 mg | ORAL_TABLET | Freq: Every day | ORAL | Status: DC
  Administered 2018-02-21 – 2018-02-23 (×3): 25 mg via ORAL
  Filled 2018-02-21 (×3): qty 1

## 2018-02-21 MED ORDER — FUROSEMIDE 10 MG/ML IJ SOLN
60.0000 mg | Freq: Every day | INTRAMUSCULAR | Status: DC
  Administered 2018-02-21 – 2018-02-22 (×2): 60 mg via INTRAVENOUS
  Filled 2018-02-21 (×2): qty 6

## 2018-02-21 MED ORDER — METHYLPREDNISOLONE SODIUM SUCC 40 MG IJ SOLR
40.0000 mg | Freq: Two times a day (BID) | INTRAMUSCULAR | Status: DC
  Administered 2018-02-21 – 2018-02-22 (×2): 40 mg via INTRAVENOUS
  Filled 2018-02-21 (×3): qty 1

## 2018-02-21 NOTE — Plan of Care (Signed)
  Problem: Nutrition: ?Goal: Adequate nutrition will be maintained ?Outcome: Completed/Met ?  ?Problem: Skin Integrity: ?Goal: Risk for impaired skin integrity will decrease ?Outcome: Completed/Met ?  ?

## 2018-02-21 NOTE — Progress Notes (Signed)
Blood sugar 8am- 128

## 2018-02-21 NOTE — Progress Notes (Signed)
Pt educated about safety and importance of bed alarm during the night however pt refuses to be on bed alarm. Will continue to round on patient.   Yesly Gerety, RN    

## 2018-02-21 NOTE — Progress Notes (Signed)
Pt was wheezing throughout the day, given Neb treatment throughout the day, getting solumedrol IV, Lasix IV, pt felt some better this evening, will continue to monitor  Palma Holter, RN

## 2018-02-21 NOTE — Progress Notes (Signed)
PROGRESS NOTE    Sherry Fitzpatrick  BPZ:[id number] DOB: 1950-12-11 DOA: 02/20/2018 PCP: Berkley Harvey, NP      Brief Narrative:  Sherry Fitzpatrick is a 67 y.o. F with HTN, hx CVA x2 without residual deficits, and diabetes who presents with dyspnea, orthopnea and chest pain, found to have hypertensive emergency.     Assessment & Plan:  Hypertensive emergency Patient initially admitted with blood pressure 250/90.  Patient stated this was her "normal" BP (which is actually true, per CareEverywhere, her systolic BP ranges 242P to 230s or 250s in the office), and so she was just started on her home oral BP meds.  CXR turned out to show edema, and so she was given Lasix and admitted for hypertensive emergency.  When she arrived here after transport, her BP was in the 150-160 range, she was without symptoms, and so she was continued on her oral regimen rather than using infusions to achieve 20-25% reduction in systolic pressure in controlled manner. -Continue home amlodipine, clonidine, HCTZ, irbesartan, metoprolol -Goal lowering BP by another 10-20 mmHg over next 24 hours -Will defer antihypertensive infusions given her BP has already responded well to oral agents    Acute diastolic CHF Put out 536RW yesterday. -Furosemide IV ordered -Obtain echocardiogram -Strict I/Os, daily weights  COPD exacerbation Severe wheezing, very dyspneic.  No personal history of asthma or definite history of COPD, but lives with "3-pack-a-day" smoker, and CareEverywhere shows recurrent "bronchitis" episodes, and she has an inhaler at home.  Spirometry in 2013 was normal in [place]. -Start Solu-Medrol twice daily -Nebulized bronchodilators, scheduled and as needed -Robitussin -Flutter valve  Diabetes A1c 6.7% in CareEverywhere.  Not on medication now.  Glucoses >200 here. -Sliding scale corrections  Upper respiratory infection Rhinovirus positive.  Has NOT been using OTC cough  medicines. -Robitussin  History of stroke -Continue aspirin, Plavix -Restart statin (LDL not at goal, persumed nonadherent)  Hypokalemia -Potassium supplement      DVT prophylaxis: Lovenox Code Status: FULL Family Communication: Daughter at bedside MDM and disposition Plan: The below labs and imaging reports were reviewed and summarized above.  The patient's status is clinically improving.  She was admitted with hypertension and pulmonary edema from hypertensive emergency.  BP has been reduced and she is improving. Also concurrent rhinovirus. Will broaden therapy today, make inpatient.  Likely 24-48 more hours IV Lasix and Solu-medrol then home with HHPT.     Consultants:   None  Procedures:   Echocardiogram  Antimicrobials:   None    Subjective: Feeling still very weak, coughing A LOT.  Sputum is whitish and thick.  No chest pain anymore.  Still very dyspneic and very wheezy.  Objective: Vitals:   02/21/18 0500 02/21/18 0758 02/21/18 0849 02/21/18 1446  BP: (!) 165/74  (!) 160/70   Pulse: 66  66   Resp: 18  18   Temp: 98 F (36.7 C)  98 F (36.7 C)   TempSrc: Oral  Oral   SpO2: 100% 97%  99%  Weight: 110.9 kg (244 lb 9.6 oz)     Height:        Intake/Output Summary (Last 24 hours) at 02/21/2018 1458 Last data filed at 02/21/2018 0600 Gross per 24 hour  Intake 480 ml  Output 925 ml  Net -445 ml   Filed Weights   02/20/18 0604 02/21/18 0500  Weight: 113.4 kg (250 lb) 110.9 kg (244 lb 9.6 oz)    Examination: General appearance: Obese adult female, alert and  in mild respiratory distress.   HEENT: Anicteric, conjunctiva pink, lids and lashes normal. No nasal deformity, discharge, epistaxis.  Lips moist, teeth normal.   Skin: Warm and dry.     No suspicious rashes or lesions. Cardiac: Tachycardic, regular, nl S1-S2, no murmurs appreciated.  Capillary refill is brisk.  JVP not visible.  Mild nonpitting LE edema.  Radial pulses 2+ and symmetric. Respiratory:  Tachypneic, labored. Pursed lip breathing.  Wheezing loud throughout.  No rales. Abdomen: Abdomen soft.  No TTP. No ascites, distension, hepatosplenomegaly.   MSK: No deformities or effusions. Neuro: Awake and alert.  EOMI, moves all extremities. Speech fluent.    Psych: Sensorium intact and responding to questions, attention normal. Affect normal.  Judgment and insight appear normal.    Data Reviewed: I have personally reviewed following labs and imaging studies:  CBC: Recent Labs  Lab 02/20/18 0630  WBC 8.2  NEUTROABS 3.9  HGB 12.6  HCT 36.3  MCV 87.1  PLT 759   Basic Metabolic Panel: Recent Labs  Lab 02/20/18 0630  NA 138  K 3.2*  CL 104  CO2 23  GLUCOSE 128*  BUN 16  CREATININE 1.03*  CALCIUM 8.4*   GFR: Estimated Creatinine Clearance: 64.9 mL/min (A) (by C-G formula based on SCr of 1.03 mg/dL (H)). Liver Function Tests: No results for input(s): AST, ALT, ALKPHOS, BILITOT, PROT, ALBUMIN in the last 168 hours. No results for input(s): LIPASE, AMYLASE in the last 168 hours. No results for input(s): AMMONIA in the last 168 hours. Coagulation Profile: No results for input(s): INR, PROTIME in the last 168 hours. Cardiac Enzymes: Recent Labs  Lab 02/20/18 0630 02/20/18 1256 02/20/18 1810 02/21/18 0015  TROPONINI <0.03 <0.03 <0.03 <0.03   BNP (last 3 results) No results for input(s): PROBNP in the last 8760 hours. HbA1C: Recent Labs    02/20/18 1256  HGBA1C 6.4*   CBG: Recent Labs  Lab 02/20/18 1254 02/20/18 1627 02/20/18 2144 02/21/18 0813 02/21/18 1200  GLUCAP 145* 110* 123* 128* 139*   Lipid Profile: Recent Labs    02/21/18 0649  CHOL 207*  HDL 38*  LDLCALC 153*  TRIG 79  CHOLHDL 5.4   Thyroid Function Tests: Recent Labs    02/20/18 1449  TSH 4.353   Anemia Panel: No results for input(s): VITAMINB12, FOLATE, FERRITIN, TIBC, IRON, RETICCTPCT in the last 72 hours. Urine analysis:    Component Value Date/Time   COLORURINE YELLOW  04/14/2015 [place] 04/14/2015 1451   LABSPEC 1.016 04/14/2015 1451   PHURINE 6.5 04/14/2015 1451   GLUCOSEU NEGATIVE 04/14/2015 1451   HGBUR NEGATIVE 04/14/2015 1451   BILIRUBINUR NEGATIVE 04/14/2015 1451   KETONESUR NEGATIVE 04/14/2015 1451   PROTEINUR NEGATIVE 04/14/2015 1451   UROBILINOGEN 0.2 04/14/2015 1451   NITRITE NEGATIVE 04/14/2015 1451   LEUKOCYTESUR NEGATIVE 04/14/2015 1451   Sepsis Labs: @LABRCNTIP (procalcitonin:4,lacticacidven:4)  ) Recent Results (from the past 240 hour(s))  Respiratory Panel by PCR     Status: Abnormal   Collection Time: 02/20/18  2:24 PM  Result Value Ref Range Status   Adenovirus NOT DETECTED NOT DETECTED Final   Coronavirus 229E NOT DETECTED NOT DETECTED Final   Coronavirus HKU1 NOT DETECTED NOT DETECTED Final   Coronavirus NL63 NOT DETECTED NOT DETECTED Final   Coronavirus OC43 NOT DETECTED NOT DETECTED Final   Metapneumovirus NOT DETECTED NOT DETECTED Final   Rhinovirus / Enterovirus DETECTED (A) NOT DETECTED Final   Influenza A NOT DETECTED NOT DETECTED Final   Influenza  B NOT DETECTED NOT DETECTED Final   Parainfluenza Virus 1 NOT DETECTED NOT DETECTED Final   Parainfluenza Virus 2 NOT DETECTED NOT DETECTED Final   Parainfluenza Virus 3 NOT DETECTED NOT DETECTED Final   Parainfluenza Virus 4 NOT DETECTED NOT DETECTED Final   Respiratory Syncytial Virus NOT DETECTED NOT DETECTED Final   Bordetella pertussis NOT DETECTED NOT DETECTED Final   Chlamydophila pneumoniae NOT DETECTED NOT DETECTED Final   Mycoplasma pneumoniae NOT DETECTED NOT DETECTED Final         Radiology Studies: Dg Chest 2 View  Result Date: 02/20/2018 CLINICAL DATA:  Chest pain and cough EXAM: CHEST - 2 VIEW COMPARISON:  12/21/2017 FINDINGS: Cardiomegaly. Diffuse interstitial coarsening with fissure thickening, new from prior. No focal opacity or effusion. Stable mediastinal contours. IMPRESSION: 1. Generalized increase in interstitial coarsening,  CHF or atypical infection could give this appearance. 2. Chronic cardiomegaly. Electronically Signed   By: Monte Fantasia M.D.   On: 02/20/2018 07:32        Scheduled Meds: . amLODipine  10 mg Oral Daily  . aspirin EC  81 mg Oral Daily  . atorvastatin  80 mg Oral q1800  . cloNIDine  0.3 mg Oral BID  . clopidogrel  75 mg Oral Daily  . dextromethorphan-guaiFENesin  1 tablet Oral BID  . enoxaparin (LOVENOX) injection  40 mg Subcutaneous Q24H  . furosemide  60 mg Intravenous Daily  . hydrochlorothiazide  25 mg Oral Daily  . insulin aspart  0-20 Units Subcutaneous TID WC  . ipratropium-albuterol  3 mL Nebulization Q6H  . irbesartan  150 mg Oral Daily  . loratadine  10 mg Oral Daily  . methylPREDNISolone (SOLU-MEDROL) injection  40 mg Intravenous Q12H  . metoprolol succinate  25 mg Oral Daily  . potassium chloride  10 mEq Oral BID   Continuous Infusions:   LOS: 0 days    Time spent: 25 minutes    Edwin Dada, MD [facility] 02/21/2018, 2:58 PM     Pager [phone] --- please page though AMION:  [email or web address] Password [email or web address] If 7PM-7AM, please contact night-coverage

## 2018-02-22 ENCOUNTER — Inpatient Hospital Stay (HOSPITAL_COMMUNITY): Payer: Medicare HMO

## 2018-02-22 DIAGNOSIS — Z96641 Presence of right artificial hip joint: Secondary | ICD-10-CM | POA: Diagnosis present

## 2018-02-22 DIAGNOSIS — Y92009 Unspecified place in unspecified non-institutional (private) residence as the place of occurrence of the external cause: Secondary | ICD-10-CM | POA: Diagnosis not present

## 2018-02-22 DIAGNOSIS — J069 Acute upper respiratory infection, unspecified: Secondary | ICD-10-CM | POA: Diagnosis present

## 2018-02-22 DIAGNOSIS — Z8673 Personal history of transient ischemic attack (TIA), and cerebral infarction without residual deficits: Secondary | ICD-10-CM | POA: Diagnosis not present

## 2018-02-22 DIAGNOSIS — Z7902 Long term (current) use of antithrombotics/antiplatelets: Secondary | ICD-10-CM | POA: Diagnosis not present

## 2018-02-22 DIAGNOSIS — Z8249 Family history of ischemic heart disease and other diseases of the circulatory system: Secondary | ICD-10-CM | POA: Diagnosis not present

## 2018-02-22 DIAGNOSIS — Z833 Family history of diabetes mellitus: Secondary | ICD-10-CM | POA: Diagnosis not present

## 2018-02-22 DIAGNOSIS — I110 Hypertensive heart disease with heart failure: Secondary | ICD-10-CM | POA: Diagnosis present

## 2018-02-22 DIAGNOSIS — Z7722 Contact with and (suspected) exposure to environmental tobacco smoke (acute) (chronic): Secondary | ICD-10-CM | POA: Diagnosis present

## 2018-02-22 DIAGNOSIS — E785 Hyperlipidemia, unspecified: Secondary | ICD-10-CM | POA: Diagnosis present

## 2018-02-22 DIAGNOSIS — J441 Chronic obstructive pulmonary disease with (acute) exacerbation: Secondary | ICD-10-CM | POA: Diagnosis present

## 2018-02-22 DIAGNOSIS — Z6841 Body Mass Index (BMI) 40.0 and over, adult: Secondary | ICD-10-CM | POA: Diagnosis not present

## 2018-02-22 DIAGNOSIS — I160 Hypertensive urgency: Secondary | ICD-10-CM | POA: Diagnosis present

## 2018-02-22 DIAGNOSIS — G4733 Obstructive sleep apnea (adult) (pediatric): Secondary | ICD-10-CM | POA: Diagnosis present

## 2018-02-22 DIAGNOSIS — Z91138 Patient's unintentional underdosing of medication regimen for other reason: Secondary | ICD-10-CM | POA: Diagnosis not present

## 2018-02-22 DIAGNOSIS — E1159 Type 2 diabetes mellitus with other circulatory complications: Secondary | ICD-10-CM | POA: Diagnosis not present

## 2018-02-22 DIAGNOSIS — E1165 Type 2 diabetes mellitus with hyperglycemia: Secondary | ICD-10-CM | POA: Diagnosis present

## 2018-02-22 DIAGNOSIS — I161 Hypertensive emergency: Secondary | ICD-10-CM | POA: Diagnosis present

## 2018-02-22 DIAGNOSIS — R079 Chest pain, unspecified: Secondary | ICD-10-CM | POA: Diagnosis not present

## 2018-02-22 DIAGNOSIS — I10 Essential (primary) hypertension: Secondary | ICD-10-CM | POA: Diagnosis not present

## 2018-02-22 DIAGNOSIS — I340 Nonrheumatic mitral (valve) insufficiency: Secondary | ICD-10-CM | POA: Diagnosis not present

## 2018-02-22 DIAGNOSIS — B348 Other viral infections of unspecified site: Secondary | ICD-10-CM | POA: Diagnosis not present

## 2018-02-22 DIAGNOSIS — E669 Obesity, unspecified: Secondary | ICD-10-CM | POA: Diagnosis present

## 2018-02-22 DIAGNOSIS — T465X6A Underdosing of other antihypertensive drugs, initial encounter: Secondary | ICD-10-CM | POA: Diagnosis present

## 2018-02-22 DIAGNOSIS — E876 Hypokalemia: Secondary | ICD-10-CM | POA: Diagnosis present

## 2018-02-22 DIAGNOSIS — I5033 Acute on chronic diastolic (congestive) heart failure: Secondary | ICD-10-CM | POA: Diagnosis present

## 2018-02-22 DIAGNOSIS — B9789 Other viral agents as the cause of diseases classified elsewhere: Secondary | ICD-10-CM | POA: Diagnosis present

## 2018-02-22 DIAGNOSIS — N179 Acute kidney failure, unspecified: Secondary | ICD-10-CM | POA: Diagnosis present

## 2018-02-22 DIAGNOSIS — T502X6A Underdosing of carbonic-anhydrase inhibitors, benzothiadiazides and other diuretics, initial encounter: Secondary | ICD-10-CM | POA: Diagnosis present

## 2018-02-22 DIAGNOSIS — T461X6A Underdosing of calcium-channel blockers, initial encounter: Secondary | ICD-10-CM | POA: Diagnosis present

## 2018-02-22 LAB — CBC
HCT: 38.9 % (ref 36.0–46.0)
Hemoglobin: 13 g/dL (ref 12.0–15.0)
MCH: 29.5 pg (ref 26.0–34.0)
MCHC: 33.4 g/dL (ref 30.0–36.0)
MCV: 88.2 fL (ref 78.0–100.0)
PLATELETS: 335 10*3/uL (ref 150–400)
RBC: 4.41 MIL/uL (ref 3.87–5.11)
RDW: 15.1 % (ref 11.5–15.5)
WBC: 16.6 10*3/uL — AB (ref 4.0–10.5)

## 2018-02-22 LAB — BASIC METABOLIC PANEL
Anion gap: 11 (ref 5–15)
BUN: 28 mg/dL — AB (ref 6–20)
CALCIUM: 9 mg/dL (ref 8.9–10.3)
CO2: 26 mmol/L (ref 22–32)
CREATININE: 1.38 mg/dL — AB (ref 0.44–1.00)
Chloride: 99 mmol/L — ABNORMAL LOW (ref 101–111)
GFR calc Af Amer: 44 mL/min — ABNORMAL LOW (ref >60)
GFR, EST NON AFRICAN AMERICAN: 38 mL/min — AB (ref >60)
Glucose, Bld: 185 mg/dL — ABNORMAL HIGH (ref 65–99)
POTASSIUM: 3.9 mmol/L (ref 3.5–5.1)
SODIUM: 136 mmol/L (ref 135–145)

## 2018-02-22 LAB — GLUCOSE, CAPILLARY
GLUCOSE-CAPILLARY: 211 mg/dL — AB (ref 65–99)
Glucose-Capillary: 160 mg/dL — ABNORMAL HIGH (ref 65–99)
Glucose-Capillary: 133 mg/dL — ABNORMAL HIGH (ref 65–99)
Glucose-Capillary: 120 mg/dL — ABNORMAL HIGH (ref 65–99)

## 2018-02-22 LAB — ECHOCARDIOGRAM COMPLETE
Height: 65 in
Weight: 3899.2 oz

## 2018-02-22 MED ORDER — PREDNISONE 20 MG PO TABS
40.0000 mg | ORAL_TABLET | Freq: Every day | ORAL | Status: DC
  Administered 2018-02-23 – 2018-02-24 (×2): 40 mg via ORAL
  Filled 2018-02-22 (×2): qty 2

## 2018-02-22 NOTE — Progress Notes (Signed)
  Echocardiogram 2D Echocardiogram has been performed.  Sherry Fitzpatrick Sherry Fitzpatrick 02/22/2018, 9:42 AM

## 2018-02-22 NOTE — Progress Notes (Signed)
Requested CPAP to be removed that was previously placed by RT. Stated she will sleep without it. Patient educated about importance however she is still refusing.  Will continue to monitor.  Brylon Brenning, RN

## 2018-02-22 NOTE — Progress Notes (Signed)
Pt walked in a hallway, felt some difficulty in breathing but oxygen saturation maintained at 94% on Room Air, will continue to monitor  Palma Holter, RN

## 2018-02-22 NOTE — Progress Notes (Signed)
Rt at bedside to place pt on CPAP.  Pt stated she does not want to wear CPAP for the night.  Pt is currently on 2L [place].  RT will continue to monitor.

## 2018-02-22 NOTE — Progress Notes (Signed)
PROGRESS NOTE    Sherry Fitzpatrick  HER:[id number] DOB: [birth year] DOA: 02/20/2018 PCP: Berkley Harvey, NP      Brief Narrative:  Sherry Fitzpatrick is a 67 y.o. F with HTN, hx CVA x2 without residual deficits, and diabetes who presents with dyspnea, orthopnea and chest pain, found to have hypertensive emergency.     Assessment & Plan:  Hypertensive emergency Patient initially admitted with blood pressure 250/90.  Patient stated this was her "normal" BP (which is actually true, per CareEverywhere, her systolic BP ranges 856D to 230s or 250s in the office), and so she was just started on her home oral BP meds.  CXR turned out to show edema, and so she was given Lasix and admitted for hypertensive emergency.  When she arrived here after transport, her BP was in the 150-160 range, she was without symptoms, and so she was continued on her oral regimen rather than using infusions to achieve 20-25% reduction in systolic pressure in controlled manner.  -Continue home amlodipine, clonidine, HCTZ, metoprolol -Hold irbesartan given worsening renal function  AKI Baseline creatinine 0.9, today 1.3. -Hold Lasix, irbesartan -Repeat BMP tomorrow  Acute diastolic CHF -149 cc yesterday, 3 kg weight loss since admission.  Creatinine increased today.  Echocardiogram shows normal EF, no significant valvular disease.  Grade 2 diastolic dysfunction. -Hold IV furosemide -Strict I/Os, daily weights  COPD exacerbation Rhinovirus infection Severe wheezing, very dyspneic.  No personal history of asthma or definite history of COPD, but lives with "3-pack-a-day" smoker, and CareEverywhere shows recurrent "bronchitis" episodes, and she has an inhaler at home.  Spirometry in 2013 was normal in [place].  Respiratory virus panel positive for rhinovirus. -Stop Solu-Medrol - Prednisone 3 more days -Continue nebulized bronchodilators scheduled and as needed -Robitussin as needed -Flutter valve    Diabetes A1c  6.7% in CareEverywhere.  Not on medication now.  Glucoses >200 here. -Continue sliding scale corrections -Will need to start metformin as an outpatient  Upper respiratory infection Rhinovirus positive.  Has NOT been using OTC cough medicines. -Robitussin  History of stroke LDL not at goal, persumed nonadherent tio cholesterol medicine -Continue aspirin, Plavix, statin  Hypokalemia -Continue potassium      DVT prophylaxis: Lovenox Code Status: FULL Family Communication: Daughter at bedside MDM and disposition Plan:  Below labs and imaging reports were reviewed and summarized above.  The patient was admitted with hypertensive emergency, leading to pulmonary edema neuro some acute kidney injury.  Her echocardiogram shows normal ejection fraction with grade 2 diastolic dysfunction.  Her BP has been reduced and she is improving in her symptoms.  She was also diagnosed with rhinovirus and started on prednisone for chronic bronchitis.  Continue adjustment of blood pressure medicines, likely home in 24 hours.   Consultants:   None  Procedures:   Echocardiogram 5/11 Study Conclusions  - Left ventricle: The cavity size was normal. There was moderate   concentric hypertrophy. Systolic function was vigorous. The   estimated ejection fraction was in the range of 65% to 70%. Wall   motion was normal; there were no regional wall motion   abnormalities. Features are consistent with a pseudonormal left   ventricular filling pattern, with concomitant abnormal relaxation   and increased filling pressure (grade 2 diastolic dysfunction).   Doppler parameters are consistent with elevated ventricular   end-diastolic filling pressure. - Aortic valve: Valve mobility was restricted. There was mild   stenosis. There was trivial regurgitation. Mean gradient (S): 10   mm Hg.  Peak gradient (S): 22 mm Hg. Valve area (VTI): 1.45 cm^2.   Valve area (Vmax): 1.28 cm^2. Valve area (Vmean): 1.59 cm^2. -  Ascending aorta: The ascending aorta was normal in size. - Mitral valve: There was mild regurgitation. - Left atrium: The atrium was mildly dilated. - Right atrium: The atrium was normal in size. - Pulmonary arteries: Systolic pressure was within the normal   range. - Inferior vena cava: The vessel was normal in size. - Pericardium, extracardiac: There was no pericardial effusion.  Antimicrobials:   None    Subjective: Dyspneic with exertion.  Severe coughing with any kind exertion.  Sputum is improving.  Her wheezing is somewhat better today.  No chest pain, apnea, leg swelling.  No fever.     Objective: Vitals:   02/22/18 0821 02/22/18 0955 02/22/18 1132 02/22/18 1327  BP: (!) 152/87  122/75   Pulse: 74  76   Resp:   20   Temp:   98.2 F (36.8 C)   TempSrc:   Oral   SpO2:  97% 94% 96%  Weight:      Height:        Intake/Output Summary (Last 24 hours) at 02/22/2018 1537 Last data filed at 02/22/2018 1004 Gross per 24 hour  Intake 720 ml  Output 850 ml  Net -130 ml   Filed Weights   02/20/18 0604 02/21/18 0500 02/22/18 0425  Weight: 113.4 kg (250 lb) 110.9 kg (244 lb 9.6 oz) 110.5 kg (243 lb 11.2 oz)    Examination: General appearance: Obese adult female, sitting on the side of the bed, interactive, no acute distress, appears very dyspneic with exertion.   HEENT: Anicteric, conjunctive are pink, lids and lashes normal.  No nasal deformity, discharge, or epistaxis.  Lips moist, teeth normal.  Oropharynx moist without oral lesions. Skin: Skin warm and dry without suspicious rashes or lesions. Cardiac: RRR, normal S1-S2, no murmurs appreciated.  JVP not visible.  No lower extremity edema. Respiratory: Tachypneic with labored breathing with exertion.  Respiratory rate normal at rest.  Wheezing throughout.  No rales. Abdomen: Abdomen soft.  No TTP. No ascites, distension, hepatosplenomegaly.   MSK: No deformities or effusions. Neuro: Awake alert, cranial nerves normal,  orthostatics with normal strength and coordination.  Speech fluent.    Psych: Sensorium intact and responding to questions, attention normal, affect normal, judgment and insight normal.    Data Reviewed: I have personally reviewed following labs and imaging studies:  CBC: Recent Labs  Lab 02/20/18 0630 02/22/18 0900  WBC 8.2 16.6*  NEUTROABS 3.9  --   HGB 12.6 13.0  HCT 36.3 38.9  MCV 87.1 88.2  PLT 297 992   Basic Metabolic Panel: Recent Labs  Lab 02/20/18 0630 02/22/18 0900  NA 138 136  K 3.2* 3.9  CL 104 99*  CO2 23 26  GLUCOSE 128* 185*  BUN 16 28*  CREATININE 1.03* 1.38*  CALCIUM 8.4* 9.0   GFR: Estimated Creatinine Clearance: 48.3 mL/min (A) (by C-G formula based on SCr of 1.38 mg/dL (H)). Liver Function Tests: No results for input(s): AST, ALT, ALKPHOS, BILITOT, PROT, ALBUMIN in the last 168 hours. No results for input(s): LIPASE, AMYLASE in the last 168 hours. No results for input(s): AMMONIA in the last 168 hours. Coagulation Profile: No results for input(s): INR, PROTIME in the last 168 hours. Cardiac Enzymes: Recent Labs  Lab 02/20/18 0630 02/20/18 1256 02/20/18 1810 02/21/18 0015  TROPONINI <0.03 <0.03 <0.03 <0.03   BNP (last 3  results) No results for input(s): PROBNP in the last 8760 hours. HbA1C: Recent Labs    02/20/18 1256  HGBA1C 6.4*   CBG: Recent Labs  Lab 02/21/18 1200 02/21/18 1602 02/21/18 2121 02/22/18 0726 02/22/18 1143  GLUCAP 139* 150* 215* 160* 211*   Lipid Profile: Recent Labs    02/21/18 0649  CHOL 207*  HDL 38*  LDLCALC 153*  TRIG 79  CHOLHDL 5.4   Thyroid Function Tests: Recent Labs    02/20/18 1449  TSH 4.353   Anemia Panel: No results for input(s): VITAMINB12, FOLATE, FERRITIN, TIBC, IRON, RETICCTPCT in the last 72 hours. Urine analysis:    Component Value Date/Time   COLORURINE YELLOW 04/14/2015 [place] 04/14/2015 1451   LABSPEC 1.016 04/14/2015 1451   PHURINE 6.5 04/14/2015  1451   GLUCOSEU NEGATIVE 04/14/2015 1451   HGBUR NEGATIVE 04/14/2015 1451   [place] 04/14/2015 1451   KETONESUR NEGATIVE 04/14/2015 1451   PROTEINUR NEGATIVE 04/14/2015 1451   UROBILINOGEN 0.2 04/14/2015 1451   NITRITE NEGATIVE 04/14/2015 1451   LEUKOCYTESUR NEGATIVE 04/14/2015 1451   Sepsis Labs: @LABRCNTIP (procalcitonin:4,lacticacidven:4)  ) Recent Results (from the past 240 hour(s))  Respiratory Panel by PCR     Status: Abnormal   Collection Time: 02/20/18  2:24 PM  Result Value Ref Range Status   Adenovirus NOT DETECTED NOT DETECTED Final   Coronavirus 229E NOT DETECTED NOT DETECTED Final   Coronavirus HKU1 NOT DETECTED NOT DETECTED Final   Coronavirus NL63 NOT DETECTED NOT DETECTED Final   Coronavirus OC43 NOT DETECTED NOT DETECTED Final   Metapneumovirus NOT DETECTED NOT DETECTED Final   Rhinovirus / Enterovirus DETECTED (A) NOT DETECTED Final   Influenza A NOT DETECTED NOT DETECTED Final   Influenza B NOT DETECTED NOT DETECTED Final   Parainfluenza Virus 1 NOT DETECTED NOT DETECTED Final   Parainfluenza Virus 2 NOT DETECTED NOT DETECTED Final   Parainfluenza Virus 3 NOT DETECTED NOT DETECTED Final   Parainfluenza Virus 4 NOT DETECTED NOT DETECTED Final   Respiratory Syncytial Virus NOT DETECTED NOT DETECTED Final   Bordetella pertussis NOT DETECTED NOT DETECTED Final   Chlamydophila pneumoniae NOT DETECTED NOT DETECTED Final   Mycoplasma pneumoniae NOT DETECTED NOT DETECTED Final         Radiology Studies: No results found.      Scheduled Meds: . amLODipine  10 mg Oral Daily  . aspirin EC  81 mg Oral Daily  . atorvastatin  80 mg Oral q1800  . cloNIDine  0.3 mg Oral BID  . clopidogrel  75 mg Oral Daily  . dextromethorphan-guaiFENesin  1 tablet Oral BID  . enoxaparin (LOVENOX) injection  40 mg Subcutaneous Q24H  . hydrochlorothiazide  25 mg Oral Daily  . insulin aspart  0-20 Units Subcutaneous TID WC  . ipratropium-albuterol  3 mL  Nebulization Q6H  . loratadine  10 mg Oral Daily  . metoprolol succinate  25 mg Oral Daily  . [START ON 02/23/2018] predniSONE  40 mg Oral Q breakfast   Continuous Infusions:   LOS: 0 days    Time spent: 25 minutes    Edwin Dada, MD [facility] 02/22/2018, 3:37 PM     Pager [phone] --- please page though AMION:  [email or web address] Password [email or web address] If 7PM-7AM, please contact night-coverage

## 2018-02-22 NOTE — Progress Notes (Signed)
Patient continues to refusing bed alarm.  Educated about importance prior.   Will continue to monitor.  Zynasia Burklow, RN

## 2018-02-23 DIAGNOSIS — R079 Chest pain, unspecified: Secondary | ICD-10-CM

## 2018-02-23 DIAGNOSIS — B348 Other viral infections of unspecified site: Secondary | ICD-10-CM

## 2018-02-23 DIAGNOSIS — N179 Acute kidney failure, unspecified: Secondary | ICD-10-CM

## 2018-02-23 LAB — BASIC METABOLIC PANEL
ANION GAP: 13 (ref 5–15)
BUN: 42 mg/dL — AB (ref 6–20)
CHLORIDE: 100 mmol/L — AB (ref 101–111)
CO2: 26 mmol/L (ref 22–32)
Calcium: 8.5 mg/dL — ABNORMAL LOW (ref 8.9–10.3)
Creatinine, Ser: 1.77 mg/dL — ABNORMAL HIGH (ref 0.44–1.00)
GFR calc Af Amer: 33 mL/min — ABNORMAL LOW (ref >60)
GFR calc non Af Amer: 28 mL/min — ABNORMAL LOW (ref >60)
GLUCOSE: 125 mg/dL — AB (ref 65–99)
POTASSIUM: 3.3 mmol/L — AB (ref 3.5–5.1)
Sodium: 139 mmol/L (ref 135–145)

## 2018-02-23 LAB — GLUCOSE, CAPILLARY
GLUCOSE-CAPILLARY: 103 mg/dL — AB (ref 65–99)
Glucose-Capillary: 131 mg/dL — ABNORMAL HIGH (ref 65–99)
Glucose-Capillary: 185 mg/dL — ABNORMAL HIGH (ref 65–99)
Glucose-Capillary: 219 mg/dL — ABNORMAL HIGH (ref 65–99)

## 2018-02-23 LAB — URINALYSIS, ROUTINE W REFLEX MICROSCOPIC
BILIRUBIN URINE: NEGATIVE
GLUCOSE, UA: NEGATIVE mg/dL
Hgb urine dipstick: NEGATIVE
Ketones, ur: NEGATIVE mg/dL
Leukocytes, UA: NEGATIVE
Nitrite: NEGATIVE
PH: 5 (ref 5.0–8.0)
Protein, ur: NEGATIVE mg/dL
SPECIFIC GRAVITY, URINE: 1.02 (ref 1.005–1.030)

## 2018-02-23 LAB — CREATININE, URINE, RANDOM: Creatinine, Urine: 145.61 mg/dL

## 2018-02-23 LAB — SODIUM, URINE, RANDOM: SODIUM UR: 77 mmol/L

## 2018-02-23 LAB — TROPONIN I
Troponin I: <0.03 ng/mL (ref <0.03)
Troponin I: <0.03 ng/mL (ref <0.03)
Troponin I: <0.03 ng/mL (ref <0.03)

## 2018-02-23 MED ORDER — NITROGLYCERIN 0.4 MG SL SUBL
0.4000 mg | SUBLINGUAL_TABLET | SUBLINGUAL | Status: DC | PRN
  Administered 2018-02-23 (×3): 0.4 mg via SUBLINGUAL
  Filled 2018-02-23: qty 1

## 2018-02-23 MED ORDER — SODIUM CHLORIDE 0.9 % IV BOLUS
250.0000 mL | Freq: Once | INTRAVENOUS | Status: AC
  Administered 2018-02-23: 250 mL via INTRAVENOUS

## 2018-02-23 MED ORDER — IPRATROPIUM-ALBUTEROL 0.5-2.5 (3) MG/3ML IN SOLN
3.0000 mL | Freq: Three times a day (TID) | RESPIRATORY_TRACT | Status: DC
  Administered 2018-02-23 – 2018-02-24 (×4): 3 mL via RESPIRATORY_TRACT
  Filled 2018-02-23 (×4): qty 3

## 2018-02-23 MED ORDER — POTASSIUM CHLORIDE CRYS ER 20 MEQ PO TBCR
40.0000 meq | EXTENDED_RELEASE_TABLET | Freq: Once | ORAL | Status: AC
  Administered 2018-02-23: 40 meq via ORAL
  Filled 2018-02-23: qty 2

## 2018-02-23 MED ORDER — ASPIRIN 81 MG PO CHEW
243.0000 mg | CHEWABLE_TABLET | Freq: Once | ORAL | Status: AC
  Administered 2018-02-23: 243 mg via ORAL
  Filled 2018-02-23: qty 3

## 2018-02-23 NOTE — Progress Notes (Addendum)
Patient was coming back from the bathroom, started crying and holding her chest. Patient stated that she is having chest pain 8/10, describing as crushing and pressure. EKG done, VS in Epic, patient on [place] 2l/min. Paged NP on call Bodenheimer and informed him about EKG and chest pain. Per NP nitroglycerin and troponin ordered. Nitroglycerin x 3 given along with GI cocktail. Patient reports no pain at this time.   Will continue to monitor.  Edith Groleau, RN

## 2018-02-23 NOTE — Progress Notes (Addendum)
Patient requested if she can have nasal canula and oxygen back, stating that " it feels better". Nurse educated patient that oxygen saturation is good but since patient was requesting she was placed back on [place] 2 l /min.   Will continue to monitor.  Retia Cordle, RN

## 2018-02-23 NOTE — Progress Notes (Signed)
Patient had 15 beats of SVT's, HR went up to 145 bpm and went back down to normal. Patient asleep, asymptomatic. Informed NP Bodenheimer on call.   Will continue to monitor.  Gaia Gullikson, RN

## 2018-02-23 NOTE — Progress Notes (Signed)
Patient remains chest pain free 0/10.  Will continue to monitor.  Faithlynn Deeley, RN

## 2018-02-23 NOTE — Progress Notes (Addendum)
PROGRESS NOTE    Sherry Fitzpatrick  WYO:[id number] DOB: 05/05/1951 DOA: 02/20/2018 PCP: Berkley Harvey, NP      Brief Narrative:  Mrs. Sherry Fitzpatrick is a 67 y.o. F with HTN, hx CVA x2 without residual deficits, and diabetes who presents with dyspnea, orthopnea and chest pain, found to have hypertensive emergency.     Assessment & Plan:  Hypertensive emergency Patient initially admitted with blood pressure 250/90.  Patient stated this was her "normal" BP (which is actually true, per [facility]Everywhere, her systolic BP ranges 774J to 230s or 250s in the office), and so she was just started on her home oral BP meds.  CXR turned out to show edema, and so she was given Lasix and admitted for hypertensive emergency.  When she arrived here after transport, her BP was in the 150-160 range, she was without symptoms, and so she was continued on her oral regimen rather than using infusions to achieve 20-25% reduction in systolic pressure in controlled manner. -Continue amlodipine, clonidine, HCTZ, metop -Hold ACE given worsening renal function   Chest pain Had severe chest tightness and dyspnea on admission.  Discussed with cardiology at the time, seemed to clearly correlate with her hypertension, resolved with treatment of BP.  Cardiology recommended re-consult if recurrence of chest pain after BP controlled.   This morning, patient woke with chest tightness, mild.  Walked to bathroom, then, as she got back in bed, developed severe, central chest pain, not radiating.  This lasted 10 minutes, until given nitroglycerin, then resolved.  ECG personally reviewed, showed slightly deeper lateral TWI, no ST changes.  This is atypical in character (states it was both "squeezing" and "stabbing/knifelike", also states it was the worst pain ever and "I'd never had anything like that before", but two minutes later states it was not as bad as her chest pain on admission, but it was similar in character).  However, given  recurrence, TW changes in lateral leads, will consult cardiology for assistance with risk stratification and trend troponins. -Cycle enzymes -NPO -Consult Cardiology, appreciate cares   AKI Baseline creatinine 0.9, today up to 1.8 -Hold Lasix, irbesartan -Check urine studies -Small IV fluids  Acute diastolic CHF -287 cc yesterday, weight loss noted since admission.Cr up again.  Diastolic dysfunction on echo -Hold furoesmide -Strict I/Os, daily weights  COPD exacerbation Rhinovirus infection Severe wheezing, very dyspneic.  No personal history of asthma or definite history of COPD, but lives with "3-pack-a-day" smoker, and [facility]Everywhere shows recurrent "bronchitis" episodes, and she has an inhaler at home.  Spirometry in 2013 was normal in [place].  Respiratory virus panel positive for rhinovirus.  Wheezing improved -Continue prednisone 2 more days -Continue nebulized bronchodilators scheduled and PRN while in the hospital -Robitussin as needed -Flutter valve    Diabetes A1c 6.7% in [facility]Everywhere.  Not on medication now.  Glucose better. -Continue SSI -Will need to start metformin as an outpatient if creatinine improves  Upper respiratory infection Rhinovirus positive.  Has NOT been using OTC cough medicines. -Robitussin  History of stroke LDL not at goal, persumed nonadherent tio cholesterol medicine -Continue aspirin, statin, Plavix  Hypokalemia -Continue potassium      DVT prophylaxis: Lovenox Code Status: FULL Family Communication: Daughter at bedside MDM and disposition Plan:  The below labs and imaging reports wree reviewed and summarized above.  ECG reviewed and summarized above,compared with previous.  She was amditted initially with hypertensive emergency and CHF as a result.  Now has developed some AKI and new  chest pain.  Work up as above.    The patient was admitted with severe range hypertension causing organ failure (acute on chronic diastolic  congestive heart failure).  She required 3 days IV diuresis, and daily monitoring of renal function, because as a result of diuresis and re-initiation of blood pressure medications, she had acute kidney injury.  The patient also has a history of chronic bronchitis, exacerbated by rhinovirus infection, for which she was given IV steroids.  It appears that the appropriate admission status for this patient is INPATIENT. This is judged to be reasonable and necessary in order to provide the required intensity of service to ensure the patient's safety given the presenting symptoms of severe chest pain, respiratory failure, physical exam findings including tachypnea, wheezing, accessory muscle use, and initial radiographic and laboratory data demonstrating congestive heart failure in the context of their chronic comorbidities.  Together, these circumstances were felt to place her at high risk for further clinical deterioration threatening life, limb, or organ.   Patient requires inpatient status due to high intensity of service, high risk for further deterioration and high frequency of surveillance required because of this severe exacerbation of their chronic organ failure.  I certify that at the point of admission it is my clinical judgment that the patient required inpatient hospital care spanning beyond 2 midnights from the point of admission and that early discharge would have resulted in unnecessary risk of decompensation and readmission or threat to life, limb or bodily function.    Consultants:   None  Procedures:   Echocardiogram 5/11 Study Conclusions  - Left ventricle: The cavity size was normal. There was moderate   concentric hypertrophy. Systolic function was vigorous. The   estimated ejection fraction was in the range of 65% to 70%. Wall   motion was normal; there were no regional wall motion   abnormalities. Features are consistent with a pseudonormal left   ventricular filling  pattern, with concomitant abnormal relaxation   and increased filling pressure (grade 2 diastolic dysfunction).   Doppler parameters are consistent with elevated ventricular   end-diastolic filling pressure. - Aortic valve: Valve mobility was restricted. There was mild   stenosis. There was trivial regurgitation. Mean gradient (S): 10   mm Hg. Peak gradient (S): 22 mm Hg. Valve area (VTI): 1.45 cm^2.   Valve area (Vmax): 1.28 cm^2. Valve area (Vmean): 1.59 cm^2. - Ascending aorta: The ascending aorta was normal in size. - Mitral valve: There was mild regurgitation. - Left atrium: The atrium was mildly dilated. - Right atrium: The atrium was normal in size. - Pulmonary arteries: Systolic pressure was within the normal   range. - Inferior vena cava: The vessel was normal in size. - Pericardium, extracardiac: There was no pericardial effusion.  Antimicrobials:   None    Subjective: Chest pain this morning.  Dyspnea with exertion persists but is improved, she actually has a hard time distinguishing between dyspnea and overall fatigue.  No exertional chest pain.  No leg sewlling.  No sputum, fever, confusion.  Cough persists.  Headache noted.  Objective: Vitals:   02/23/18 0537 02/23/18 0540 02/23/18 0612 02/23/18 0616  BP: (!) 155/91 129/84  (!) 155/90  Pulse:    65  Resp:      Temp:      TempSrc:      SpO2:      Weight:   111.8 kg (246 lb 7 oz)   Height:        Intake/Output Summary (  Last 24 hours) at 02/23/2018 1009 Last data filed at 02/23/2018 0955 Gross per 24 hour  Intake 1080 ml  Output 1500 ml  Net -420 ml   Filed Weights   02/21/18 0500 02/22/18 0425 02/23/18 0612  Weight: 110.9 kg (244 lb 9.6 oz) 110.5 kg (243 lb 11.2 oz) 111.8 kg (246 lb 7 oz)    Examination: General appearance: Obese female, sitting in recliner, interactive, NAD. HEENT: Anicteric, conjunctiva pink, lids and lashees normal, no nasal deformity or discharge.  Lips and teeth normal.  OP moist, no  oral lesions. Skin: Skin warm and dry without rashes. Cardiac: RRR, no murmurs, normal JVP, 1+ LE edema. Respiratory: Respiratory effor tnormal.  Wheezing improved, but still present.  No rales.    Abdomen: Abdomen soft without TTP or HSM. MSK: No deformities or effusions. Neuro: Cranial nerves normal, sensation intact, speech fluent.  Moves all extremities with normal strength. Psych: Oriented to person, place and time.  Normal affect.  Normal judgment and insight.    Data Reviewed: I have personally reviewed following labs and imaging studies:  CBC: Recent Labs  Lab 02/20/18 0630 02/22/18 0900  WBC 8.2 16.6*  NEUTROABS 3.9  --   HGB 12.6 13.0  HCT 36.3 38.9  MCV 87.1 88.2  PLT 297 875   Basic Metabolic Panel: Recent Labs  Lab 02/20/18 0630 02/22/18 0900 02/23/18 0540  NA 138 136 139  K 3.2* 3.9 3.3*  CL 104 99* 100*  CO2 23 26 26   GLUCOSE 128* 185* 125*  BUN 16 28* 42*  CREATININE 1.03* 1.38* 1.77*  CALCIUM 8.4* 9.0 8.5*   GFR: Estimated Creatinine Clearance: 37.9 mL/min (A) (by C-G formula based on SCr of 1.77 mg/dL (H)). Liver Function Tests: No results for input(s): AST, ALT, ALKPHOS, BILITOT, PROT, ALBUMIN in the last 168 hours. No results for input(s): LIPASE, AMYLASE in the last 168 hours. No results for input(s): AMMONIA in the last 168 hours. Coagulation Profile: No results for input(s): INR, PROTIME in the last 168 hours. Cardiac Enzymes: Recent Labs  Lab 02/20/18 0630 02/20/18 1256 02/20/18 1810 02/21/18 0015 02/23/18 0540  TROPONINI <0.03 <0.03 <0.03 <0.03 <0.03   BNP (last 3 results) No results for input(s): PROBNP in the last 8760 hours. HbA1C: Recent Labs    02/20/18 1256  HGBA1C 6.4*   CBG: Recent Labs  Lab 02/22/18 0726 02/22/18 1143 02/22/18 1612 02/22/18 2123 02/23/18 0730  GLUCAP 160* 211* 133* 120* 103*   Lipid Profile: Recent Labs    02/21/18 0649  CHOL 207*  HDL 38*  LDLCALC 153*  TRIG 79  CHOLHDL 5.4    Thyroid Function Tests: Recent Labs    02/20/18 1449  TSH 4.353   Anemia Panel: No results for input(s): VITAMINB12, FOLATE, FERRITIN, TIBC, IRON, RETICCTPCT in the last 72 hours. Urine analysis:    Component Value Date/Time   COLORURINE YELLOW 04/14/2015 [place] 04/14/2015 1451   LABSPEC 1.016 04/14/2015 1451   PHURINE 6.5 04/14/2015 1451   GLUCOSEU NEGATIVE 04/14/2015 1451   HGBUR NEGATIVE 04/14/2015 1451   BILIRUBINUR NEGATIVE 04/14/2015 1451   KETONESUR NEGATIVE 04/14/2015 1451   PROTEINUR NEGATIVE 04/14/2015 1451   UROBILINOGEN 0.2 04/14/2015 1451   NITRITE NEGATIVE 04/14/2015 1451   LEUKOCYTESUR NEGATIVE 04/14/2015 1451   Sepsis Labs: @LABRCNTIP (procalcitonin:4,lacticacidven:4)  ) Recent Results (from the past 240 hour(s))  Respiratory Panel by PCR     Status: Abnormal   Collection Time: 02/20/18  2:24 PM  Result Value Ref  Range Status   Adenovirus NOT DETECTED NOT DETECTED Final   Coronavirus 229E NOT DETECTED NOT DETECTED Final   Coronavirus HKU1 NOT DETECTED NOT DETECTED Final   Coronavirus NL63 NOT DETECTED NOT DETECTED Final   Coronavirus OC43 NOT DETECTED NOT DETECTED Final   Metapneumovirus NOT DETECTED NOT DETECTED Final   Rhinovirus / Enterovirus DETECTED (A) NOT DETECTED Final   Influenza A NOT DETECTED NOT DETECTED Final   Influenza B NOT DETECTED NOT DETECTED Final   Parainfluenza Virus 1 NOT DETECTED NOT DETECTED Final   Parainfluenza Virus 2 NOT DETECTED NOT DETECTED Final   Parainfluenza Virus 3 NOT DETECTED NOT DETECTED Final   Parainfluenza Virus 4 NOT DETECTED NOT DETECTED Final   Respiratory Syncytial Virus NOT DETECTED NOT DETECTED Final   Bordetella pertussis NOT DETECTED NOT DETECTED Final   Chlamydophila pneumoniae NOT DETECTED NOT DETECTED Final   Mycoplasma pneumoniae NOT DETECTED NOT DETECTED Final         Radiology Studies: No results found.      Scheduled Meds: . amLODipine  10 mg Oral Daily  .  aspirin EC  81 mg Oral Daily  . atorvastatin  80 mg Oral q1800  . cloNIDine  0.3 mg Oral BID  . clopidogrel  75 mg Oral Daily  . dextromethorphan-guaiFENesin  1 tablet Oral BID  . enoxaparin (LOVENOX) injection  40 mg Subcutaneous Q24H  . hydrochlorothiazide  25 mg Oral Daily  . insulin aspart  0-20 Units Subcutaneous TID WC  . ipratropium-albuterol  3 mL Nebulization Q6H  . loratadine  10 mg Oral Daily  . metoprolol succinate  25 mg Oral Daily  . predniSONE  40 mg Oral Q breakfast   Continuous Infusions: . sodium chloride 250 mL (02/23/18 0941)     LOS: 1 day    Time spent: 35 mnutes    Edwin Dada, MD [facility] 02/23/2018, 8:00 AM     Pager [phone] --- please page though AMION:  [email or web address] If 7PM-7AM, please contact night-coverage

## 2018-02-23 NOTE — Progress Notes (Signed)
Pt is aware about having stress test tomorrow am and NPO from midnight, denies CP and SOB during 7am to 7 pm shift, will continue to monitor  Palma Holter, RN

## 2018-02-24 ENCOUNTER — Inpatient Hospital Stay (HOSPITAL_COMMUNITY): Payer: Medicare HMO

## 2018-02-24 DIAGNOSIS — E1159 Type 2 diabetes mellitus with other circulatory complications: Secondary | ICD-10-CM

## 2018-02-24 LAB — BASIC METABOLIC PANEL
Anion gap: 6 (ref 5–15)
BUN: 33 mg/dL — AB (ref 6–20)
CHLORIDE: 102 mmol/L (ref 101–111)
CO2: 31 mmol/L (ref 22–32)
Calcium: 8.3 mg/dL — ABNORMAL LOW (ref 8.9–10.3)
Creatinine, Ser: 1.29 mg/dL — ABNORMAL HIGH (ref 0.44–1.00)
GFR calc Af Amer: 48 mL/min — ABNORMAL LOW (ref >60)
GFR calc non Af Amer: 42 mL/min — ABNORMAL LOW (ref >60)
Glucose, Bld: 117 mg/dL — ABNORMAL HIGH (ref 65–99)
Potassium: 4.2 mmol/L (ref 3.5–5.1)
SODIUM: 139 mmol/L (ref 135–145)

## 2018-02-24 LAB — CBC
HCT: 36.1 % (ref 36.0–46.0)
Hemoglobin: 11.8 g/dL — ABNORMAL LOW (ref 12.0–15.0)
MCH: 29.4 pg (ref 26.0–34.0)
MCHC: 32.7 g/dL (ref 30.0–36.0)
MCV: 90 fL (ref 78.0–100.0)
PLATELETS: 312 10*3/uL (ref 150–400)
RBC: 4.01 MIL/uL (ref 3.87–5.11)
RDW: 15.5 % (ref 11.5–15.5)
WBC: 11 10*3/uL — AB (ref 4.0–10.5)

## 2018-02-24 LAB — GLUCOSE, CAPILLARY
Glucose-Capillary: 102 mg/dL — ABNORMAL HIGH (ref 65–99)
Glucose-Capillary: 160 mg/dL — ABNORMAL HIGH (ref 65–99)

## 2018-02-24 MED ORDER — CLOPIDOGREL BISULFATE 75 MG PO TABS: 75.0000 mg | ORAL_TABLET | Freq: Every day | ORAL | 3 refills | Status: DC

## 2018-02-24 MED ORDER — ALBUTEROL SULFATE HFA 108 (90 BASE) MCG/ACT IN AERS: 2.0000 | INHALATION_SPRAY | Freq: Four times a day (QID) | RESPIRATORY_TRACT | 3 refills | Status: DC | PRN

## 2018-02-24 MED ORDER — REGADENOSON 0.4 MG/5ML IV SOLN
0.4000 mg | Freq: Once | INTRAVENOUS | Status: DC
  Filled 2018-02-24: qty 5

## 2018-02-24 MED ORDER — DM-GUAIFENESIN ER 30-600 MG PO TB12: 1.0000 | ORAL_TABLET | Freq: Two times a day (BID) | ORAL | 1 refills | Status: DC

## 2018-02-24 MED ORDER — TECHNETIUM TC 99M TETROFOSMIN IV KIT
10.0000 | PACK | Freq: Once | INTRAVENOUS | Status: AC | PRN
  Administered 2018-02-24: 10 via INTRAVENOUS

## 2018-02-24 MED ORDER — METOPROLOL SUCCINATE ER 25 MG PO TB24: 25.0000 mg | ORAL_TABLET | Freq: Every day | ORAL | 3 refills | Status: DC

## 2018-02-24 MED ORDER — REGADENOSON 0.4 MG/5ML IV SOLN
INTRAVENOUS | Status: AC
  Filled 2018-02-24: qty 5

## 2018-02-24 MED ORDER — PREDNISONE 20 MG PO TABS: 40.0000 mg | ORAL_TABLET | Freq: Every day | ORAL | 0 refills | Status: DC

## 2018-02-24 MED ORDER — AMLODIPINE BESYLATE 10 MG PO TABS: 10.0000 mg | ORAL_TABLET | Freq: Every day | ORAL | 3 refills | Status: DC

## 2018-02-24 MED ORDER — CLONIDINE HCL 0.3 MG PO TABS: 0.3000 mg | ORAL_TABLET | Freq: Two times a day (BID) | ORAL | 3 refills | Status: DC

## 2018-02-24 MED ORDER — ATORVASTATIN CALCIUM 80 MG PO TABS: 80.0000 mg | ORAL_TABLET | Freq: Every day | ORAL | 3 refills | Status: DC

## 2018-02-24 MED ORDER — VALSARTAN 160 MG PO TABS: 160.0000 mg | ORAL_TABLET | Freq: Every day | ORAL | 3 refills | Status: DC

## 2018-02-24 MED ORDER — HYDROCHLOROTHIAZIDE 25 MG PO TABS: 25.0000 mg | ORAL_TABLET | Freq: Every day | ORAL | 3 refills | Status: DC

## 2018-02-24 NOTE — Progress Notes (Signed)
Pt discharge instructions reviewed with pt. Pt verbalizes understanding and states she has no questions. Pt belongings with pt. Pt is not in distress. Pt's friend is coming to drive her home.

## 2018-02-24 NOTE — Progress Notes (Signed)
   Reviewed chart and spoke to pt, did brief exam.  67 yo female w/ no hx CAD but w/ hx poorly controlled HTN, was admitted 05/09 w/ HTN emergency, acute diastolic CHF and CP.   Pt came down for MV 05/13, resting images obtained. Ez neg MI, ECG not acute, no CP.  However, on exam, pt w/ sig exp wheeze, coughing frequently. Requiring O2.   Will cancel MV for today, try again tomorrow if pt breathing better. Will ask nursing staff to give a neb just before she comes down, that may help.   Otherwise, per IM.  Rosaria Ferries, PA-C 02/24/2018 9:39 AM Bee[facility] [phone]

## 2018-02-24 NOTE — Progress Notes (Signed)
Pt discharged via wheel chair.

## 2018-02-24 NOTE — Discharge Summary (Signed)
Physician Discharge Summary  KENNIDY LAMKE ERX:[id number] DOB: 09/22/1951 DOA: 02/20/2018  PCP: Lilian Coma., MD  Admit date: 02/20/2018 Discharge date: 02/24/2018  Admitted From: Home  Disposition:  Home   Recommendations for Outpatient Follow-up:  1. Follow up with PCP in 1 week 2. Please obtain BMP in one week to re-eval creatinine 3. Please obtain spirometry after resolution of the current infection 4. Patient diagnosed with diabetes, please follow up with initiation of metformin 5. Follow up with Cardiology in 1 week  Home Health: None  Equipment/Devices: None  Discharge Condition: Good  CODE STATUS: FULL Diet recommendation: Diabetic, low sodium  Brief/Interim Summary: Mrs. Reddish is a 67 y.o. F with HTN, hx CVA x2 without residual deficits, and T2DM who presents with dyspnea, orthopnea and chest pain, found to have hypertensive emergency, BP 256/84 mmHg.      Hypertensive emergency Patient initially admitted with blood pressure 250/90.  Patient stated this was her "normal" BP (which is actually true, per CareEverywhere, her systolic BP ranges 950D to 230s or 250s in the office), and so she was just started on her home oral BP meds.  CXR turned out to show edema, and so she was given Lasix and admitted for hypertensive emergency.  When she arrived to [facility] after transport from [place], her BP was in the 150-160 range, she was without symptoms, and so she was continued on her oral regimen rather than using infusions to achieve 20-25% reduction in systolic pressure in controlled manner.  HCTZ and ACE held for last two days given rising creatinine.     Chest pain Had severe chest tightness and dyspnea on admission.  Discussed with cardiology at the time, seemed to clearly correlate with her hypertension, resolved with treatment of BP.  Cardiology recommended re-consult if recurrence of chest pain after BP controlled. On hospital day 3, patient woke with mild  chest tightness.  Then developed severe, central chest pain, not radiating.  This lasted 10 minutes, until given nitroglycerin, then resolved.  ECG personally reviewed, showed slightly deeper lateral TWI, no ST changes.  Pain was atypical in character.  Stress testing was attempted, but patient was too wheezy from Rhinovirus to perform Myoview.  AKI Baseline creatinine 0.9.  With Lasix, HCTZ and ACE, patient's creatinine trended up to 1.8.  Was given gentle fluids and ACE/HCTZ held, and Cr improved to 1.2 on day of discharge.  FeNA low.  Acute diastolic CHF Echocardiogram showed normal EF.  CHF resolved with treatment of blood pressure.  Presumed COPD exacerbation Rhinovirus infection Severe wheezing, very dyspneic.  No personal history of asthma or definite history of COPD, but lives with "3-pack-a-day" smoker, and CareEverywhere shows recurrent "bronchitis" episodes, and she has an inhaler at home.  Spirometry in 2013 was normal in [place].  Respiratory virus panel positive for rhinovirus.  Got 5 day steroid burst, finished in hospital.  Discharged with Robitussin, albuterol and will need spirometry as an outpatient.       Diabetes A1c 6.7% in CareEverywhere.  6.8% here.  Not on medication now.  Will need to start metformin as an outpatient if creatinine improves.  Upper respiratory infection Rhinovirus positive.    History of stroke LDL not at goal, persumed nonadherent to cholesterol medicine.  Continue aspirin, statin, Plavix.  Hypokalemia Resolved.          Discharge Diagnoses:  Principal Problem:   Hypertensive emergency Active Problems:   Acute kidney injury   Acute on chronic diastolic  CHF   Chest pain   Type 2 diabetes   Body mass index 40.0-44.9, adult [facility])     Discharge Instructions  Discharge Instructions    Diet - low sodium heart healthy   Complete by:  As directed    Diet Carb Modified   Complete by:  As directed    Discharge  instructions   Complete by:  As directed    From Dr. Loleta Books: You were admitted with high blood pressure that caused an episode of heart failure (in other words, a "hypertensive emergency").   This was treated with blood pressure medicines and IV Lasix.   We also found that you had a respiratory infection with rhinovirus. For this, because we suspect you may have some undetected COPD, you got steroids. Finish the course with one more day of prednisone 40 mg (two tabs) tomorrow morning  When you get home, if you still are out of breath, take albuterol 2 puffs up to every 6 hours If you have cough, take guiafenesin-dextromethorphan cough syrup. Do NOT NOT NOT take any cough medicines that contain pseudoephedrine (Sudafed or any cold product with "-D" ending) or anything with phenylephrine in it    Your blood pressure improved.   For your blood pressure:     -Take amlodipine 10 mg daily     -Take clonidine 0.3 mg twice daily     -Take metoprolol 25 mg once daily In 4 days, restart your HCTZ and valsartan     -HCTZ 25 mg daily     -Valsartan 160 mg daily   Call Dr. Tereasa Coop for an appointment in one week for BP check, blood work (check kidney function) and start diabetes medicine  The Cardiology office will call you in the next week for a follow up appointment for a stress test.   Increase activity slowly   Complete by:  As directed      Allergies as of 02/24/2018      Reactions   Morphine And Related Hives, Nausea And Vomiting   10 years ago    Penicillins Hives   Has patient had a PCN reaction causing immediate rash, facial/tongue/throat swelling, SOB or lightheadedness with hypotension: YES Has patient had a PCN reaction causing severe rash involving mucus membranes or skin necrosis: NO Has patient had a PCN reaction that required hospitalization NO Has patient had a PCN reaction occurring within the last 10 years: NO If all of the above answers are "NO", then may proceed  with Cephalosporin use.   Percocet [oxycodone-acetaminophen] Nausea And Vomiting   Ace Inhibitors Nausea And Vomiting   Other reaction(s): COUGH Other reaction(s): NAUSEA,VOMITING   Iodinated Diagnostic Agents Nausea And Vomiting   Other reaction(s): VOMITING   Iodine Itching   Etodolac    Other reaction(s): NAUSEA,VOMITING   Vicodin [hydrocodone-acetaminophen] Nausea And Vomiting      Medication List    STOP taking these medications   potassium chloride 10 MEQ tablet Commonly known as:  K-DUR     TAKE these medications   albuterol 108 (90 Base) MCG/ACT inhaler Commonly known as:  PROVENTIL HFA;VENTOLIN HFA Inhale 2 puffs into the lungs every 6 (six) hours as needed for wheezing.   amLODipine 10 MG tablet Commonly known as:  NORVASC Take 1 tablet (10 mg total) by mouth daily.   atorvastatin 80 MG tablet Commonly known as:  LIPITOR Take 1 tablet (80 mg total) by mouth daily.   cloNIDine 0.3 MG tablet Commonly known as:  CATAPRES  Take 1 tablet (0.3 mg total) by mouth 2 (two) times daily.   clopidogrel 75 MG tablet Commonly known as:  PLAVIX Take 1 tablet (75 mg total) by mouth daily.   dextromethorphan-guaiFENesin 30-600 MG 12hr tablet Commonly known as:  MUCINEX DM Take 1 tablet by mouth 2 (two) times daily.   hydrochlorothiazide 25 MG tablet Commonly known as:  HYDRODIURIL Take 1 tablet (25 mg total) by mouth daily.   loratadine 10 MG tablet Commonly known as:  CLARITIN Take 10 mg by mouth daily.   metoprolol succinate 25 MG 24 hr tablet Commonly known as:  TOPROL-XL Take 1 tablet (25 mg total) by mouth daily.   nitroGLYCERIN 0.4 MG SL tablet Commonly known as:  NITROSTAT Place 1 tablet (0.4 mg total) under the tongue every 5 (five) minutes as needed for chest pain (hold for SBP < 110).   predniSONE 20 MG tablet Commonly known as:  DELTASONE Take 2 tablets (40 mg total) by mouth daily with breakfast. Start taking on:  02/25/2018   traMADol 50 MG  tablet Commonly known as:  ULTRAM Take 50 mg by mouth every 6 (six) hours as needed for pain. Reported on 02/09/2016   valsartan 160 MG tablet Commonly known as:  DIOVAN Take 1 tablet (160 mg total) by mouth daily.      Follow-up Information    Tereasa Coop, PA-C. Go on 03/03/2018.   Specialty:  Physician Assistant Why:  @2 :40pm Contact information: [address] [place] [address] [phone]          Allergies  Allergen Reactions  . Morphine And Related Hives and Nausea And Vomiting    10 years ago   . Penicillins Hives    Has patient had a PCN reaction causing immediate rash, facial/tongue/throat swelling, SOB or lightheadedness with hypotension: YES Has patient had a PCN reaction causing severe rash involving mucus membranes or skin necrosis: NO Has patient had a PCN reaction that required hospitalization NO Has patient had a PCN reaction occurring within the last 10 years: NO If all of the above answers are "NO", then may proceed with Cephalosporin use.  Marland Kitchen Percocet [Oxycodone-Acetaminophen] Nausea And Vomiting  . Ace Inhibitors Nausea And Vomiting    Other reaction(s): COUGH Other reaction(s): NAUSEA,VOMITING   . Iodinated Diagnostic Agents Nausea And Vomiting    Other reaction(s): VOMITING   . Iodine Itching  . Etodolac     Other reaction(s): NAUSEA,VOMITING  . Vicodin [Hydrocodone-Acetaminophen] Nausea And Vomiting    Consultations:  None   Procedures/Studies: Dg Chest 2 View  Result Date: 02/20/2018 CLINICAL DATA:  Chest pain and cough EXAM: CHEST - 2 VIEW COMPARISON:  12/21/2017 FINDINGS: Cardiomegaly. Diffuse interstitial coarsening with fissure thickening, new from prior. No focal opacity or effusion. Stable mediastinal contours. IMPRESSION: 1. Generalized increase in interstitial coarsening, CHF or atypical infection could give this appearance. 2. Chronic cardiomegaly. Electronically Signed   By: Monte Fantasia M.D.   On: 02/20/2018  07:32   Echocardiogram 5/11  Study Conclusions  - Left ventricle: The cavity size was normal. There was moderate   concentric hypertrophy. Systolic function was vigorous. The   estimated ejection fraction was in the range of 65% to 70%. Wall   motion was normal; there were no regional wall motion   abnormalities. Features are consistent with a pseudonormal left   ventricular filling pattern, with concomitant abnormal relaxation   and increased filling pressure (grade 2 diastolic dysfunction).   Doppler parameters are consistent  with elevated ventricular   end-diastolic filling pressure. - Aortic valve: Valve mobility was restricted. There was mild   stenosis. There was trivial regurgitation. Mean gradient (S): 10   mm Hg. Peak gradient (S): 22 mm Hg. Valve area (VTI): 1.45 cm^2.   Valve area (Vmax): 1.28 cm^2. Valve area (Vmean): 1.59 cm^2. - Ascending aorta: The ascending aorta was normal in size. - Mitral valve: There was mild regurgitation. - Left atrium: The atrium was mildly dilated. - Right atrium: The atrium was normal in size. - Pulmonary arteries: Systolic pressure was within the normal   range. - Inferior vena cava: The vessel was normal in size. - Pericardium, extracardiac: There was no pericardial effusion.    Subjective: Feels still very wheezy.  Tired.  No chest pain after yesteray morning.  No exertional symptoms.  Still somewhat dyspneic with exertion.    Discharge Exam: Vitals:   02/24/18 1137 02/24/18 1320  BP:    Pulse:    Resp:    Temp:    SpO2: 100% 96%   Vitals:   02/24/18 0951 02/24/18 1008 02/24/18 1137 02/24/18 1320  BP: (!) 139/94     Pulse: 65     Resp:      Temp:      TempSrc:      SpO2:  96% 100% 96%  Weight:      Height:        General: Pt is alert, awake, not in acute distress, sitting in a recliner Cardiovascular: RRR, S1/S2 +, no rubs, no gallops Respiratory: CTA bilaterally, wheezing noted, no rales Abdominal: Soft, NT, ND,  bowel sounds + Extremities: no edema, no cyanosis    The results of significant diagnostics from this hospitalization (including imaging, microbiology, ancillary and laboratory) are listed below for reference.     Microbiology: Recent Results (from the past 240 hour(s))  Respiratory Panel by PCR     Status: Abnormal   Collection Time: 02/20/18  2:24 PM  Result Value Ref Range Status   Adenovirus NOT DETECTED NOT DETECTED Final   Coronavirus 229E NOT DETECTED NOT DETECTED Final   Coronavirus HKU1 NOT DETECTED NOT DETECTED Final   Coronavirus NL63 NOT DETECTED NOT DETECTED Final   Coronavirus OC43 NOT DETECTED NOT DETECTED Final   Metapneumovirus NOT DETECTED NOT DETECTED Final   Rhinovirus / Enterovirus DETECTED (A) NOT DETECTED Final   Influenza A NOT DETECTED NOT DETECTED Final   Influenza B NOT DETECTED NOT DETECTED Final   Parainfluenza Virus 1 NOT DETECTED NOT DETECTED Final   Parainfluenza Virus 2 NOT DETECTED NOT DETECTED Final   Parainfluenza Virus 3 NOT DETECTED NOT DETECTED Final   Parainfluenza Virus 4 NOT DETECTED NOT DETECTED Final   Respiratory Syncytial Virus NOT DETECTED NOT DETECTED Final   Bordetella pertussis NOT DETECTED NOT DETECTED Final   Chlamydophila pneumoniae NOT DETECTED NOT DETECTED Final   Mycoplasma pneumoniae NOT DETECTED NOT DETECTED Final     Labs: BNP (last 3 results) Recent Labs    02/20/18 0630  BNP 95.6   Basic Metabolic Panel: Recent Labs  Lab 02/20/18 0630 02/22/18 0900 02/23/18 0540 02/24/18 0629  NA 138 136 139 139  K 3.2* 3.9 3.3* 4.2  CL 104 99* 100* 102  CO2 23 26 26 31   GLUCOSE 128* 185* 125* 117*  BUN 16 28* 42* 33*  CREATININE 1.03* 1.38* 1.77* 1.29*  CALCIUM 8.4* 9.0 8.5* 8.3*   Liver Function Tests: No results for input(s): AST, ALT, ALKPHOS, BILITOT, PROT, ALBUMIN  in the last 168 hours. No results for input(s): LIPASE, AMYLASE in the last 168 hours. No results for input(s): AMMONIA in the last 168  hours. CBC: Recent Labs  Lab 02/20/18 0630 02/22/18 0900 02/24/18 0629  WBC 8.2 16.6* 11.0*  NEUTROABS 3.9  --   --   HGB 12.6 13.0 11.8*  HCT 36.3 38.9 36.1  MCV 87.1 88.2 90.0  PLT 297 335 312   Cardiac Enzymes: Recent Labs  Lab 02/20/18 1810 02/21/18 0015 02/23/18 0540 02/23/18 1112 02/23/18 1647  TROPONINI <0.03 <0.03 <0.03 <0.03 <0.03   BNP: Invalid input(s): POCBNP CBG: Recent Labs  Lab 02/23/18 1233 02/23/18 1703 02/23/18 2119 02/24/18 0722 02/24/18 1129  GLUCAP 131* 185* 219* 102* 160*   D-Dimer No results for input(s): DDIMER in the last 72 hours. Hgb A1c No results for input(s): HGBA1C in the last 72 hours. Lipid Profile No results for input(s): CHOL, HDL, LDLCALC, TRIG, CHOLHDL, LDLDIRECT in the last 72 hours. Thyroid function studies No results for input(s): TSH, T4TOTAL, T3FREE, THYROIDAB in the last 72 hours.  Invalid input(s): FREET3 Anemia work up No results for input(s): VITAMINB12, FOLATE, FERRITIN, TIBC, IRON, RETICCTPCT in the last 72 hours. Urinalysis    Component Value Date/Time   COLORURINE YELLOW 02/23/2018 1436   APPEARANCEUR HAZY (A) 02/23/2018 1436   LABSPEC 1.020 02/23/2018 1436   PHURINE 5.0 02/23/2018 1436   GLUCOSEU NEGATIVE 02/23/2018 1436   HGBUR NEGATIVE 02/23/2018 1436   BILIRUBINUR NEGATIVE 02/23/2018 1436   KETONESUR NEGATIVE 02/23/2018 1436   PROTEINUR NEGATIVE 02/23/2018 1436   UROBILINOGEN 0.2 04/14/2015 1451   NITRITE NEGATIVE 02/23/2018 1436   LEUKOCYTESUR NEGATIVE 02/23/2018 1436   Sepsis Labs Invalid input(s): PROCALCITONIN,  WBC,  LACTICIDVEN Microbiology Recent Results (from the past 240 hour(s))  Respiratory Panel by PCR     Status: Abnormal   Collection Time: 02/20/18  2:24 PM  Result Value Ref Range Status   Adenovirus NOT DETECTED NOT DETECTED Final   Coronavirus 229E NOT DETECTED NOT DETECTED Final   Coronavirus HKU1 NOT DETECTED NOT DETECTED Final   Coronavirus NL63 NOT DETECTED NOT DETECTED  Final   Coronavirus OC43 NOT DETECTED NOT DETECTED Final   Metapneumovirus NOT DETECTED NOT DETECTED Final   Rhinovirus / Enterovirus DETECTED (A) NOT DETECTED Final   Influenza A NOT DETECTED NOT DETECTED Final   Influenza B NOT DETECTED NOT DETECTED Final   Parainfluenza Virus 1 NOT DETECTED NOT DETECTED Final   Parainfluenza Virus 2 NOT DETECTED NOT DETECTED Final   Parainfluenza Virus 3 NOT DETECTED NOT DETECTED Final   Parainfluenza Virus 4 NOT DETECTED NOT DETECTED Final   Respiratory Syncytial Virus NOT DETECTED NOT DETECTED Final   Bordetella pertussis NOT DETECTED NOT DETECTED Final   Chlamydophila pneumoniae NOT DETECTED NOT DETECTED Final   Mycoplasma pneumoniae NOT DETECTED NOT DETECTED Final     Time coordinating discharge: 50 minutes      SIGNED:   Edwin Dada, MD  [facility] 02/24/2018, 2:26 PM

## 2018-02-24 NOTE — Consult Note (Signed)
            [facility] CM Primary Care Navigator  02/24/2018  Sherry Fitzpatrick 12/12/1950 [id number]   Attempt to see patient at the bedside to identify possible discharge needsbutshe was alreadydischargedhome today.  Patient was seen fordyspnea, orthopnea and chest pain; was admitted and treated for hypertensive emergency (250/90).  Patient has discharge instruction to follow up withprimary care provider in 1 week and cardiology in 1 week.   For additional questions please contact:  Edwena Felty A. Levern Kalka, BSN, RN-BC [facility] PRIMARY CARE Navigator Cell: [phone]

## 2018-02-25 DIAGNOSIS — R079 Chest pain, unspecified: Secondary | ICD-10-CM | POA: Diagnosis not present

## 2018-04-06 DIAGNOSIS — R29818 Other symptoms and signs involving the nervous system: Secondary | ICD-10-CM | POA: Diagnosis not present

## 2018-04-06 DIAGNOSIS — F17200 Nicotine dependence, unspecified, uncomplicated: Secondary | ICD-10-CM | POA: Diagnosis not present

## 2018-04-06 DIAGNOSIS — I110 Hypertensive heart disease with heart failure: Secondary | ICD-10-CM | POA: Diagnosis not present

## 2018-04-06 DIAGNOSIS — G44209 Tension-type headache, unspecified, not intractable: Secondary | ICD-10-CM | POA: Diagnosis not present

## 2018-04-06 DIAGNOSIS — G4489 Other headache syndrome: Secondary | ICD-10-CM | POA: Diagnosis not present

## 2018-04-06 DIAGNOSIS — I509 Heart failure, unspecified: Secondary | ICD-10-CM | POA: Diagnosis not present

## 2018-04-06 DIAGNOSIS — R0602 Shortness of breath: Secondary | ICD-10-CM | POA: Diagnosis not present

## 2018-04-06 DIAGNOSIS — Z8673 Personal history of transient ischemic attack (TIA), and cerebral infarction without residual deficits: Secondary | ICD-10-CM | POA: Diagnosis not present

## 2018-04-06 DIAGNOSIS — Z79899 Other long term (current) drug therapy: Secondary | ICD-10-CM | POA: Diagnosis not present

## 2018-07-01 ENCOUNTER — Encounter (HOSPITAL_BASED_OUTPATIENT_CLINIC_OR_DEPARTMENT_OTHER): Payer: Self-pay | Admitting: Emergency Medicine

## 2018-07-01 ENCOUNTER — Inpatient Hospital Stay (HOSPITAL_BASED_OUTPATIENT_CLINIC_OR_DEPARTMENT_OTHER)
Admission: EM | Admit: 2018-07-01 | Discharge: 2018-07-06 | DRG: 304 | Disposition: A | Discharge status: Home / Self Care | Payer: Medicare HMO | Attending: Family Medicine | Admitting: Family Medicine

## 2018-07-01 ENCOUNTER — Emergency Department (HOSPITAL_BASED_OUTPATIENT_CLINIC_OR_DEPARTMENT_OTHER): Payer: Medicare HMO

## 2018-07-01 ENCOUNTER — Other Ambulatory Visit: Payer: Self-pay

## 2018-07-01 DIAGNOSIS — F17210 Nicotine dependence, cigarettes, uncomplicated: Secondary | ICD-10-CM | POA: Diagnosis present

## 2018-07-01 DIAGNOSIS — Z888 Allergy status to other drugs, medicaments and biological substances status: Secondary | ICD-10-CM

## 2018-07-01 DIAGNOSIS — Z8249 Family history of ischemic heart disease and other diseases of the circulatory system: Secondary | ICD-10-CM | POA: Diagnosis not present

## 2018-07-01 DIAGNOSIS — N179 Acute kidney failure, unspecified: Secondary | ICD-10-CM | POA: Diagnosis not present

## 2018-07-01 DIAGNOSIS — I248 Other forms of acute ischemic heart disease: Secondary | ICD-10-CM | POA: Diagnosis present

## 2018-07-01 DIAGNOSIS — G4733 Obstructive sleep apnea (adult) (pediatric): Secondary | ICD-10-CM | POA: Diagnosis not present

## 2018-07-01 DIAGNOSIS — Z8673 Personal history of transient ischemic attack (TIA), and cerebral infarction without residual deficits: Secondary | ICD-10-CM

## 2018-07-01 DIAGNOSIS — Z96641 Presence of right artificial hip joint: Secondary | ICD-10-CM | POA: Diagnosis present

## 2018-07-01 DIAGNOSIS — I639 Cerebral infarction, unspecified: Secondary | ICD-10-CM | POA: Diagnosis not present

## 2018-07-01 DIAGNOSIS — M1990 Unspecified osteoarthritis, unspecified site: Secondary | ICD-10-CM | POA: Diagnosis not present

## 2018-07-01 DIAGNOSIS — I169 Hypertensive crisis, unspecified: Secondary | ICD-10-CM | POA: Insufficient documentation

## 2018-07-01 DIAGNOSIS — I161 Hypertensive emergency: Principal | ICD-10-CM | POA: Diagnosis present

## 2018-07-01 DIAGNOSIS — E785 Hyperlipidemia, unspecified: Secondary | ICD-10-CM | POA: Diagnosis not present

## 2018-07-01 DIAGNOSIS — I10 Essential (primary) hypertension: Secondary | ICD-10-CM

## 2018-07-01 DIAGNOSIS — Z885 Allergy status to narcotic agent status: Secondary | ICD-10-CM | POA: Diagnosis not present

## 2018-07-01 DIAGNOSIS — Z91041 Radiographic dye allergy status: Secondary | ICD-10-CM

## 2018-07-01 DIAGNOSIS — I5033 Acute on chronic diastolic (congestive) heart failure: Secondary | ICD-10-CM | POA: Diagnosis present

## 2018-07-01 DIAGNOSIS — I110 Hypertensive heart disease with heart failure: Secondary | ICD-10-CM | POA: Diagnosis not present

## 2018-07-01 DIAGNOSIS — Z6841 Body Mass Index (BMI) 40.0 and over, adult: Secondary | ICD-10-CM | POA: Diagnosis not present

## 2018-07-01 DIAGNOSIS — R072 Precordial pain: Secondary | ICD-10-CM

## 2018-07-01 DIAGNOSIS — R7989 Other specified abnormal findings of blood chemistry: Secondary | ICD-10-CM

## 2018-07-01 DIAGNOSIS — I5032 Chronic diastolic (congestive) heart failure: Secondary | ICD-10-CM | POA: Diagnosis present

## 2018-07-01 DIAGNOSIS — Z79899 Other long term (current) drug therapy: Secondary | ICD-10-CM | POA: Diagnosis not present

## 2018-07-01 DIAGNOSIS — Z880 Allergy status to penicillin: Secondary | ICD-10-CM

## 2018-07-01 DIAGNOSIS — Z7952 Long term (current) use of systemic steroids: Secondary | ICD-10-CM

## 2018-07-01 DIAGNOSIS — Z7902 Long term (current) use of antithrombotics/antiplatelets: Secondary | ICD-10-CM

## 2018-07-01 DIAGNOSIS — K219 Gastro-esophageal reflux disease without esophagitis: Secondary | ICD-10-CM | POA: Diagnosis present

## 2018-07-01 DIAGNOSIS — R0602 Shortness of breath: Secondary | ICD-10-CM

## 2018-07-01 DIAGNOSIS — E876 Hypokalemia: Secondary | ICD-10-CM | POA: Diagnosis not present

## 2018-07-01 DIAGNOSIS — R079 Chest pain, unspecified: Secondary | ICD-10-CM | POA: Diagnosis present

## 2018-07-01 DIAGNOSIS — I160 Hypertensive urgency: Secondary | ICD-10-CM | POA: Diagnosis not present

## 2018-07-01 DIAGNOSIS — I5030 Unspecified diastolic (congestive) heart failure: Secondary | ICD-10-CM | POA: Diagnosis present

## 2018-07-01 DIAGNOSIS — R05 Cough: Secondary | ICD-10-CM | POA: Diagnosis not present

## 2018-07-01 DIAGNOSIS — K5900 Constipation, unspecified: Secondary | ICD-10-CM | POA: Diagnosis not present

## 2018-07-01 DIAGNOSIS — Z720 Tobacco use: Secondary | ICD-10-CM | POA: Diagnosis not present

## 2018-07-01 LAB — COMPREHENSIVE METABOLIC PANEL
ALT: 26 U/L (ref 0–44)
AST: 30 U/L (ref 15–41)
Albumin: 3.7 g/dL (ref 3.5–5.0)
Alkaline Phosphatase: 68 U/L (ref 38–126)
Anion gap: 13 (ref 5–15)
BUN: 12 mg/dL (ref 8–23)
CHLORIDE: 99 mmol/L (ref 98–111)
CO2: 28 mmol/L (ref 22–32)
CREATININE: 1.04 mg/dL — AB (ref 0.44–1.00)
Calcium: 8.6 mg/dL — ABNORMAL LOW (ref 8.9–10.3)
GFR, EST NON AFRICAN AMERICAN: 54 mL/min — AB (ref >60)
Glucose, Bld: 109 mg/dL — ABNORMAL HIGH (ref 70–99)
Potassium: 3.2 mmol/L — ABNORMAL LOW (ref 3.5–5.1)
Sodium: 140 mmol/L (ref 135–145)
TOTAL PROTEIN: 8.5 g/dL — AB (ref 6.5–8.1)
Total Bilirubin: 0.5 mg/dL (ref 0.3–1.2)

## 2018-07-01 LAB — CBC WITH DIFFERENTIAL/PLATELET
BASOS ABS: 0.1 10*3/uL (ref 0.0–0.1)
Basophils Relative: 1 %
EOS ABS: 0.3 10*3/uL (ref 0.0–0.7)
Eosinophils Relative: 4 %
HCT: 37.4 % (ref 36.0–46.0)
Hemoglobin: 12.7 g/dL (ref 12.0–15.0)
LYMPHS PCT: 33 %
Lymphs Abs: 2.4 10*3/uL (ref 0.7–4.0)
MCH: 29.5 pg (ref 26.0–34.0)
MCHC: 34 g/dL (ref 30.0–36.0)
MCV: 87 fL (ref 78.0–100.0)
MONO ABS: 0.7 10*3/uL (ref 0.1–1.0)
Monocytes Relative: 9 %
NEUTROS ABS: 3.9 10*3/uL (ref 1.7–7.7)
Neutrophils Relative %: 53 %
PLATELETS: 370 10*3/uL (ref 150–400)
RBC: 4.3 MIL/uL (ref 3.87–5.11)
RDW: 14 % (ref 11.5–15.5)
WBC: 7.4 10*3/uL (ref 4.0–10.5)

## 2018-07-01 LAB — URINALYSIS, ROUTINE W REFLEX MICROSCOPIC
BILIRUBIN URINE: NEGATIVE
Glucose, UA: NEGATIVE mg/dL
HGB URINE DIPSTICK: NEGATIVE
Ketones, ur: NEGATIVE mg/dL
Leukocytes, UA: NEGATIVE
NITRITE: NEGATIVE
PH: 7 (ref 5.0–8.0)
Protein, ur: NEGATIVE mg/dL
SPECIFIC GRAVITY, URINE: 1.01 (ref 1.005–1.030)

## 2018-07-01 LAB — BRAIN NATRIURETIC PEPTIDE: B NATRIURETIC PEPTIDE 5: 113.7 pg/mL — AB (ref 0.0–100.0)

## 2018-07-01 LAB — TROPONIN I: Troponin I: <0.03 ng/mL (ref <0.03)

## 2018-07-01 LAB — D-DIMER, QUANTITATIVE: D-Dimer, Quant: 1.94 ug/mL-FEU — ABNORMAL HIGH (ref 0.00–0.50)

## 2018-07-01 MED ORDER — ALBUTEROL SULFATE (2.5 MG/3ML) 0.083% IN NEBU
INHALATION_SOLUTION | RESPIRATORY_TRACT | Status: AC
  Administered 2018-07-01: 2.5 mg
  Filled 2018-07-01: qty 3

## 2018-07-01 MED ORDER — FUROSEMIDE 10 MG/ML IJ SOLN
40.0000 mg | Freq: Once | INTRAMUSCULAR | Status: AC
  Administered 2018-07-01: 40 mg via INTRAVENOUS
  Filled 2018-07-01: qty 4

## 2018-07-01 MED ORDER — IPRATROPIUM-ALBUTEROL 0.5-2.5 (3) MG/3ML IN SOLN
RESPIRATORY_TRACT | Status: AC
  Administered 2018-07-01: 3 mL via RESPIRATORY_TRACT
  Filled 2018-07-01: qty 3

## 2018-07-01 MED ORDER — AMLODIPINE BESYLATE 10 MG PO TABS
10.0000 mg | ORAL_TABLET | Freq: Every day | ORAL | Status: DC
  Administered 2018-07-01 – 2018-07-02 (×2): 10 mg via ORAL
  Filled 2018-07-01 (×2): qty 1

## 2018-07-01 MED ORDER — ALBUTEROL SULFATE (2.5 MG/3ML) 0.083% IN NEBU: 5.0000 mg | INHALATION_SOLUTION | Freq: Once | RESPIRATORY_TRACT | Status: DC

## 2018-07-01 MED ORDER — IPRATROPIUM-ALBUTEROL 0.5-2.5 (3) MG/3ML IN SOLN
3.0000 mL | Freq: Four times a day (QID) | RESPIRATORY_TRACT | Status: DC
  Administered 2018-07-01 (×2): 3 mL via RESPIRATORY_TRACT
  Filled 2018-07-01: qty 3

## 2018-07-01 MED ORDER — HYDROCHLOROTHIAZIDE 25 MG PO TABS
25.0000 mg | ORAL_TABLET | Freq: Every day | ORAL | Status: DC
  Administered 2018-07-02: 25 mg via ORAL
  Filled 2018-07-01: qty 1

## 2018-07-01 MED ORDER — LABETALOL HCL 5 MG/ML IV SOLN
10.0000 mg | Freq: Once | INTRAVENOUS | Status: AC
  Administered 2018-07-01: 10 mg via INTRAVENOUS
  Filled 2018-07-01: qty 4

## 2018-07-01 MED ORDER — LORATADINE 10 MG PO TABS
10.0000 mg | ORAL_TABLET | Freq: Every day | ORAL | Status: DC
  Administered 2018-07-02 – 2018-07-05 (×4): 10 mg via ORAL
  Filled 2018-07-01 (×4): qty 1

## 2018-07-01 MED ORDER — HYDRALAZINE HCL 20 MG/ML IJ SOLN
5.0000 mg | INTRAMUSCULAR | Status: DC | PRN
  Administered 2018-07-01 – 2018-07-05 (×3): 5 mg via INTRAVENOUS
  Filled 2018-07-01 (×4): qty 1

## 2018-07-01 MED ORDER — CLONIDINE HCL 0.2 MG PO TABS
0.3000 mg | ORAL_TABLET | Freq: Three times a day (TID) | ORAL | Status: DC
  Administered 2018-07-01 – 2018-07-02 (×2): 0.3 mg via ORAL
  Filled 2018-07-01 (×2): qty 1

## 2018-07-01 MED ORDER — NITROGLYCERIN 0.4 MG SL SUBL: 0.4000 mg | SUBLINGUAL_TABLET | SUBLINGUAL | Status: DC | PRN

## 2018-07-01 MED ORDER — ALBUTEROL SULFATE (2.5 MG/3ML) 0.083% IN NEBU
2.5000 mg | INHALATION_SOLUTION | RESPIRATORY_TRACT | Status: DC | PRN
  Administered 2018-07-05: 2.5 mg via RESPIRATORY_TRACT
  Filled 2018-07-01: qty 3

## 2018-07-01 MED ORDER — POTASSIUM CHLORIDE 20 MEQ/15ML (10%) PO SOLN
40.0000 meq | Freq: Once | ORAL | Status: AC
  Administered 2018-07-01: 40 meq via ORAL
  Filled 2018-07-01: qty 30

## 2018-07-01 MED ORDER — SODIUM CHLORIDE 0.9% FLUSH
3.0000 mL | Freq: Two times a day (BID) | INTRAVENOUS | Status: DC
  Administered 2018-07-01 – 2018-07-05 (×9): 3 mL via INTRAVENOUS

## 2018-07-01 MED ORDER — HYDRALAZINE HCL 20 MG/ML IJ SOLN
10.0000 mg | Freq: Once | INTRAMUSCULAR | Status: AC
  Administered 2018-07-01: 10 mg via INTRAVENOUS

## 2018-07-01 MED ORDER — ENOXAPARIN SODIUM 40 MG/0.4ML ~~LOC~~ SOLN
40.0000 mg | SUBCUTANEOUS | Status: DC
  Administered 2018-07-01 – 2018-07-05 (×5): 40 mg via SUBCUTANEOUS
  Filled 2018-07-01 (×5): qty 0.4

## 2018-07-01 MED ORDER — SODIUM CHLORIDE 0.9 % IV SOLN: 250.0000 mL | INTRAVENOUS | Status: DC | PRN

## 2018-07-01 MED ORDER — GUAIFENESIN-DM 100-10 MG/5ML PO SYRP
15.0000 mL | ORAL_SOLUTION | ORAL | Status: DC | PRN
  Administered 2018-07-01 – 2018-07-05 (×4): 15 mL via ORAL
  Filled 2018-07-01 (×4): qty 15

## 2018-07-01 MED ORDER — ASPIRIN EC 81 MG PO TBEC
81.0000 mg | DELAYED_RELEASE_TABLET | Freq: Every day | ORAL | Status: DC
  Administered 2018-07-02 – 2018-07-06 (×5): 81 mg via ORAL
  Filled 2018-07-01 (×5): qty 1

## 2018-07-01 MED ORDER — IPRATROPIUM-ALBUTEROL 0.5-2.5 (3) MG/3ML IN SOLN: 3.0000 mL | Freq: Four times a day (QID) | RESPIRATORY_TRACT | Status: DC

## 2018-07-01 MED ORDER — ZOLPIDEM TARTRATE 5 MG PO TABS: 5.0000 mg | ORAL_TABLET | Freq: Every evening | ORAL | Status: DC | PRN

## 2018-07-01 MED ORDER — ACETAMINOPHEN 325 MG PO TABS: 650.0000 mg | ORAL_TABLET | ORAL | Status: DC | PRN

## 2018-07-01 MED ORDER — FUROSEMIDE 10 MG/ML IJ SOLN
40.0000 mg | Freq: Every day | INTRAMUSCULAR | Status: DC
  Administered 2018-07-02: 40 mg via INTRAVENOUS
  Filled 2018-07-01: qty 4

## 2018-07-01 MED ORDER — IRBESARTAN 150 MG PO TABS: 150.0000 mg | ORAL_TABLET | Freq: Every day | ORAL | Status: DC

## 2018-07-01 MED ORDER — IRBESARTAN 150 MG PO TABS
150.0000 mg | ORAL_TABLET | Freq: Every day | ORAL | Status: DC
  Administered 2018-07-02 – 2018-07-06 (×5): 150 mg via ORAL
  Filled 2018-07-01 (×5): qty 1

## 2018-07-01 MED ORDER — DIPHENHYDRAMINE HCL 50 MG/ML IJ SOLN
50.0000 mg | Freq: Once | INTRAMUSCULAR | Status: AC
  Administered 2018-07-02: 50 mg via INTRAVENOUS
  Filled 2018-07-01: qty 1

## 2018-07-01 MED ORDER — HYDROCORTISONE NA SUCCINATE PF 250 MG IJ SOLR
200.0000 mg | Freq: Once | INTRAMUSCULAR | Status: AC
  Administered 2018-07-01: 200 mg via INTRAVENOUS
  Filled 2018-07-01: qty 200

## 2018-07-01 MED ORDER — CLOPIDOGREL BISULFATE 75 MG PO TABS
75.0000 mg | ORAL_TABLET | Freq: Every day | ORAL | Status: DC
  Administered 2018-07-02 – 2018-07-06 (×5): 75 mg via ORAL
  Filled 2018-07-01 (×5): qty 1

## 2018-07-01 MED ORDER — TRAMADOL HCL 50 MG PO TABS
50.0000 mg | ORAL_TABLET | Freq: Four times a day (QID) | ORAL | Status: DC | PRN
  Administered 2018-07-04: 50 mg via ORAL
  Filled 2018-07-01: qty 1

## 2018-07-01 MED ORDER — ONDANSETRON HCL 4 MG/2ML IJ SOLN
4.0000 mg | Freq: Four times a day (QID) | INTRAMUSCULAR | Status: DC | PRN
  Administered 2018-07-01: 4 mg via INTRAVENOUS
  Filled 2018-07-01: qty 2

## 2018-07-01 MED ORDER — SODIUM CHLORIDE 0.9% FLUSH: 3.0000 mL | INTRAVENOUS | Status: DC | PRN

## 2018-07-01 MED ORDER — DM-GUAIFENESIN ER 30-600 MG PO TB12
1.0000 | ORAL_TABLET | Freq: Two times a day (BID) | ORAL | Status: DC
  Administered 2018-07-01 – 2018-07-06 (×10): 1 via ORAL
  Filled 2018-07-01 (×10): qty 1

## 2018-07-01 MED ORDER — NICOTINE 21 MG/24HR TD PT24
21.0000 mg | MEDICATED_PATCH | Freq: Every day | TRANSDERMAL | Status: DC
  Administered 2018-07-01 – 2018-07-06 (×6): 21 mg via TRANSDERMAL
  Filled 2018-07-01 (×6): qty 1

## 2018-07-01 MED ORDER — ATORVASTATIN CALCIUM 80 MG PO TABS
80.0000 mg | ORAL_TABLET | Freq: Every day | ORAL | Status: DC
  Administered 2018-07-02 – 2018-07-06 (×5): 80 mg via ORAL
  Filled 2018-07-01 (×5): qty 1

## 2018-07-01 MED ORDER — METOPROLOL SUCCINATE ER 25 MG PO TB24
25.0000 mg | ORAL_TABLET | Freq: Every day | ORAL | Status: DC
  Administered 2018-07-01 – 2018-07-06 (×6): 25 mg via ORAL
  Filled 2018-07-01 (×6): qty 1

## 2018-07-01 NOTE — H&P (Signed)
History and Physical    Sherry Fitzpatrick XVQ:[id number] DOB: Feb 20, 1951 DOA: 07/01/2018  Referring MD/NP/PA:   PCP: Lilian Coma., MD   Patient coming from:  The patient is coming from home.  At baseline, pt is independent for most of ADL.  Chief Complaint: Chest pain, shortness of breath  HPI: Sherry Fitzpatrick is a 67 y.o. female with medical history significant of hypertension, hyperlipidemia, stroke, GERD, tobacco abuse, obesity, OSA not on CPAP, pituitary tumor (status of post surgery), dCHF, who presents with chest pain, shortness of breath.  Patient states that she has been having intermittent chest pain for about 1 week, which is located in the substernal area,4-9 out of 10 in severity, pressure-like, nonradiating.  It is associated with shortness of breath.  It is exertional and also pleuritic.  It is aggravated by both exertion and deep breath.  No tenderness in the calf areas.  No recent long distant traveling.  Patient has cough with white mucus production.  No fever or chills.  She also reports bilateral leg edema.  No nausea, vomiting, diarrhea or abdominal pain pain no symptoms of UTI.  No unilateral tingling or numbness in extremities, facial droop or slurred speech. Her blood pressure is elevated, 234/110 in ED.  Patient was treated with 10 mg of labetalol, and 40 mg of IV Lasix in ED, blood pressure decreased to 176/93. Patient states that she has been compliant to taking all her blood pressure medications.   ED Course: pt was found to have negative troponin, BNP 113.7, negative urinalysis, d-dimer 1.94, potassium 3.2, WBC 9.4, creatinine 1.04, temperature 99.8, bradycardia, tachypnea, oxygen saturation 98% on room air.  Chest x-ray showed interstitial pulmonary edema and vascular congestion without infiltration.  Patient is admitted to stepdown for observation.  Review of Systems:   General: no fevers, chills, has fatigue HEENT: no blurry vision, hearing changes or sore  throat Respiratory: has dyspnea, coughing, no wheezing CV: has chest pain, no palpitations GI: no nausea, vomiting, abdominal pain, diarrhea, constipation GU: no dysuria, burning on urination, increased urinary frequency, hematuria  Ext: has mild leg edema Neuro: no unilateral weakness, numbness, or tingling, no vision change or hearing loss Skin: no rash, no skin tear. MSK: No muscle spasm, no deformity, no limitation of range of movement in spin Heme: No easy bruising.  Travel history: No recent long distant travel.  Allergy:  Allergies  Allergen Reactions  . Morphine And Related Hives and Nausea And Vomiting    10 years ago   . Penicillins Hives    Has patient had a PCN reaction causing immediate rash, facial/tongue/throat swelling, SOB or lightheadedness with hypotension: YES Has patient had a PCN reaction causing severe rash involving mucus membranes or skin necrosis: NO Has patient had a PCN reaction that required hospitalization NO Has patient had a PCN reaction occurring within the last 10 years: NO If all of the above answers are "NO", then may proceed with Cephalosporin use.  Marland Kitchen Percocet [Oxycodone-Acetaminophen] Nausea And Vomiting  . Ace Inhibitors Nausea And Vomiting    Other reaction(s): COUGH Other reaction(s): NAUSEA,VOMITING   . Iodinated Diagnostic Agents Nausea And Vomiting    Other reaction(s): VOMITING   . Iodine Itching  . Etodolac     Other reaction(s): NAUSEA,VOMITING  . Vicodin [Hydrocodone-Acetaminophen] Nausea And Vomiting    Past Medical History:  Diagnosis Date  . Alterations of sensations, late effect of cerebrovascular disease(438.6)   . Arthritis    "all over"  . Backache,  unspecified   . Body mass index 40.0-44.9, adult ([place])   . Carpal tunnel syndrome   . Dizziness and giddiness   . Esophageal reflux   . Generalized pain   . Headache    "had alot til I had pituitary tumor removed"  . Heart murmur   . Other abnormal glucose   . Other  and unspecified hyperlipidemia   . Other malaise and fatigue   . Other postprocedural status(V45.89)   . Other symptoms involving cardiovascular system   . Sleep apnea    "did test; they wanted me to get a CPAP but I never did get it" (04/14/2015)  . Stroke ([place]) 06/2003; 02/2010   "minor; minor" ; denies residual on 04/14/2015  . Unspecified disorder of the pituitary gland and its hypothalamic control   . Unspecified essential hypertension   . Unspecified visual disturbance     Past Surgical History:  Procedure Laterality Date  . CARPAL TUNNEL RELEASE Left ~ 2014  . JOINT REPLACEMENT    . TOTAL HIP ARTHROPLASTY Right 11/06/2010  . TRANSPHENOIDAL / TRANSNASAL HYPOPHYSECTOMY / RESECTION PITUITARY TUMOR  06/13/2012  . TUBAL LIGATION  1974    Social History:  reports that she has been smoking cigarettes. She has never used smokeless tobacco. She reports that she drinks alcohol. She reports that she does not use drugs.  Family History:  Family History  Problem Relation Age of Onset  . Heart failure Mother   . Hypertension Mother   . Diabetes Father   . Hypertension Sister   . Heart failure Sister   . Hypertension Daughter      Prior to Admission medications   Medication Sig Start Date End Date Taking? Authorizing Provider  albuterol (PROVENTIL HFA;VENTOLIN HFA) 108 (90 Base) MCG/ACT inhaler Inhale 2 puffs into the lungs every 6 (six) hours as needed for wheezing. 02/24/18   Danford, Suann Larry, MD  amLODipine (NORVASC) 10 MG tablet Take 1 tablet (10 mg total) by mouth daily. 02/24/18   Danford, Suann Larry, MD  atorvastatin (LIPITOR) 80 MG tablet Take 1 tablet (80 mg total) by mouth daily. 02/24/18   Danford, Suann Larry, MD  cloNIDine (CATAPRES) 0.3 MG tablet Take 1 tablet (0.3 mg total) by mouth 2 (two) times daily. 02/24/18   Danford, Suann Larry, MD  clopidogrel (PLAVIX) 75 MG tablet Take 1 tablet (75 mg total) by mouth daily. 02/24/18   Danford, Suann Larry, MD   dextromethorphan-guaiFENesin (MUCINEX DM) 30-600 MG 12hr tablet Take 1 tablet by mouth 2 (two) times daily. 02/24/18   Danford, Suann Larry, MD  hydrochlorothiazide (HYDRODIURIL) 25 MG tablet Take 1 tablet (25 mg total) by mouth daily. 02/24/18   Danford, Suann Larry, MD  loratadine (CLARITIN) 10 MG tablet Take 10 mg by mouth daily.    [provider]  metoprolol succinate (TOPROL-XL) 25 MG 24 hr tablet Take 1 tablet (25 mg total) by mouth daily. 02/24/18   Danford, Suann Larry, MD  nitroGLYCERIN (NITROSTAT) 0.4 MG SL tablet Place 1 tablet (0.4 mg total) under the tongue every 5 (five) minutes as needed for chest pain (hold for SBP < 110). 04/16/15   Rai, Ripudeep K, MD  predniSONE (DELTASONE) 20 MG tablet Take 2 tablets (40 mg total) by mouth daily with breakfast. 02/25/18   Danford, Suann Larry, MD  traMADol (ULTRAM) 50 MG tablet Take 50 mg by mouth every 6 (six) hours as needed for pain. Reported on 02/09/2016    [provider]  valsartan (DIOVAN) 160  MG tablet Take 1 tablet (160 mg total) by mouth daily. 02/24/18   Edwin Dada, MD    Physical Exam: Vitals:   07/01/18 1530 07/01/18 1600 07/01/18 1630 07/01/18 1852  BP: (!) 169/76 (!) 191/98 (!) 194/77 (!) 176/93  Pulse: (!) 59 (!) 58 (!) 58 64  Resp: (!) 23 (!) 25 (!) 21   Temp:    98.8 F (37.1 C)  TempSrc:    Oral  SpO2: 98% 98% 99% 98%  Weight:      Height:       General: Not in acute distress HEENT:       Eyes: PERRL, EOMI, no scleral icterus.       ENT: No discharge from the ears and nose, no pharynx injection, no tonsillar enlargement.        Neck: Difficult to assess JVD due to obesity.  No bruit, no mass felt. Heme: No neck lymph node enlargement. Cardiac: S1/S2, RRR, No murmurs, No gallops or rubs. Respiratory: No rales, wheezing, rhonchi or rubs. GI: Soft, nondistended, nontender, no rebound pain, no organomegaly, BS present. GU: No hematuria Ext: has trace leg edema bilaterally. 2+DP/PT  pulse bilaterally. Musculoskeletal: No joint deformities, No joint redness or warmth, no limitation of ROM in spin. Skin: No rashes.  Neuro: Alert, oriented X3, cranial nerves II-XII grossly intact, moves all extremities normally.  Psych: Patient is not psychotic, no suicidal or hemocidal ideation.  Labs on Admission: I have personally reviewed following labs and imaging studies  CBC: Recent Labs  Lab 07/01/18 1146  WBC 7.4  NEUTROABS 3.9  HGB 12.7  HCT 37.4  MCV 87.0  PLT 017   Basic Metabolic Panel: Recent Labs  Lab 07/01/18 1146  NA 140  K 3.2*  CL 99  CO2 28  GLUCOSE 109*  BUN 12  CREATININE 1.04*  CALCIUM 8.6*   GFR: Estimated Creatinine Clearance: 62.1 mL/min (A) (by C-G formula based on SCr of 1.04 mg/dL (H)). Liver Function Tests: Recent Labs  Lab 07/01/18 1146  AST 30  ALT 26  ALKPHOS 68  BILITOT 0.5  PROT 8.5*  ALBUMIN 3.7   No results for input(s): LIPASE, AMYLASE in the last 168 hours. No results for input(s): AMMONIA in the last 168 hours. Coagulation Profile: No results for input(s): INR, PROTIME in the last 168 hours. Cardiac Enzymes: Recent Labs  Lab 07/01/18 1146  TROPONINI <0.03   BNP (last 3 results) No results for input(s): PROBNP in the last 8760 hours. HbA1C: No results for input(s): HGBA1C in the last 72 hours. CBG: No results for input(s): GLUCAP in the last 168 hours. Lipid Profile: No results for input(s): CHOL, HDL, LDLCALC, TRIG, CHOLHDL, LDLDIRECT in the last 72 hours. Thyroid Function Tests: No results for input(s): TSH, T4TOTAL, FREET4, T3FREE, THYROIDAB in the last 72 hours. Anemia Panel: No results for input(s): VITAMINB12, FOLATE, FERRITIN, TIBC, IRON, RETICCTPCT in the last 72 hours. Urine analysis:    Component Value Date/Time   COLORURINE YELLOW 07/01/2018 [place] 07/01/2018 1146   LABSPEC 1.010 07/01/2018 1146   PHURINE 7.0 07/01/2018 1146   GLUCOSEU NEGATIVE 07/01/2018 1146   HGBUR  NEGATIVE 07/01/2018 1146   BILIRUBINUR NEGATIVE 07/01/2018 1146   KETONESUR NEGATIVE 07/01/2018 1146   PROTEINUR NEGATIVE 07/01/2018 1146   UROBILINOGEN 0.2 04/14/2015 1451   NITRITE NEGATIVE 07/01/2018 1146   LEUKOCYTESUR NEGATIVE 07/01/2018 1146   Sepsis Labs: @LABRCNTIP (procalcitonin:4,lacticidven:4) )No results found for this or any previous visit (from the past 240  hour(s)).   Radiological Exams on Admission: Dg Chest 2 View  Result Date: 07/01/2018 CLINICAL DATA:  Cough, shortness of breath EXAM: CHEST - 2 VIEW COMPARISON:  02/20/2018 FINDINGS: Mild cardiomegaly. Lingular atelectasis. Vascular congestion and interstitial prominence could reflect early interstitial edema. No effusions or acute bony abnormality. IMPRESSION: Cardiomegaly with vascular congestion. Increasing interstitial prominence could reflect early interstitial edema. Lingular atelectasis. Electronically Signed   By: Rolm Baptise M.D.   On: 07/01/2018 11:27     EKG: Independently reviewed.  Sinus rhythm, QTC 459, mild T wave inversion in V5-V6, and inferior leads  Assessment/Plan Principal Problem:   Hypertensive urgency Active Problems:   Chest pain   Hyperlipidemia   Essential hypertension   Stroke (HCC)   Tobacco abuse   Hypokalemia   Acute on chronic diastolic CHF (congestive heart failure) (HCC)   Positive D dimer   Hypertensive urgency: Bp 234/110--> 176/93 after treated with 10 mg of labetalol and 40 mg of Lasix in the ED.  -Will admit to SDU -IV hydralazine as needed -Continue home medications: Labetalol, clonidine, HCTZ, metoprolol, -Switched Diovan to irbesartan -Increase clonidine from 0.3 mg twice daily to 3 times daily -goal of bp reduction is by about 25 to 30% in first several hours, at SBP 160 to 180 mmHg. -if bp is not controlled-->will start IV nitroglycerin drip  Essential hypertension: -see above  Chest pain: Most likely due to demand ischemia secondary to hypertensive  emergency.  Troponin negative.  EKG has mild T wave inversion.  Since her chest pain has a pleuritic nature, d-dimer +1.96 in ED, will need to rule out PE.  Patient is allergic to iodine, will need pretreatment before doing CT angiogram. -will pretreat with Solu-Cortef 200 mg at 8:30 PM, 50 mg of Benadryl at 11:30 PM, and then do CT angiogram at 00:30. - cycle CE q6 x3 and repeat EKG in the am  - prn Nitroglycerin, plavix, lipitor  - prn tramadol and Tylenol for chest pain (patient is allergic to morphine) - Risk factor stratification: will check FLP, UDS and A1C  - 2d echo  Hyperlipidemia: -lipitor  Stroke ([place]) -on plavix and lipitor  Tobacco abuse: -Nicotine patch  Hypokalemia: K=3.2 on admission. - Repleted - Check Mg level  Acute on chronic diastolic CHF (congestive heart failure) [facility]): Patient has trace amount of leg edema, BNP is only 113.7, but patient has interstitial pulmonary edema and vascular congestion on chest x-ray, most likely due to flash of pulmonary edema secondary to hypertensive urgency.  2D echo on 02/22/2018 showed EF of 65-70% with grade 2 diastolic dysfunction. -Lasix 40 mg daily - Continue metoprolol. -Follow-up 2D echo   Positive D dimer: -will get LE venous doppler to r/o DVT   DVT ppx: SQ Lovenox Code Status: Full code Family Communication:  Yes, patient's daughter and grandson at bed side Disposition Plan:  Anticipate discharge back to previous home environment Consults called:  none Admission status:    SDU/obs     Date of Service 07/01/2018    Ivor Costa [facility] Pager [phone]  If 7PM-7AM, please contact night-coverage [email or web address] Password [facility] 07/01/2018, 8:34 PM

## 2018-07-01 NOTE — ED Provider Notes (Signed)
[facility] EMERGENCY DEPARTMENT Provider Note   CSN: [id number] Arrival date & time: 07/01/18  1034     History   Chief Complaint Chief Complaint  Patient presents with  . Shortness of Breath  . Chest Pain    HPI Sherry Fitzpatrick is a 67 y.o. female.  The history is provided by the patient and medical records. No language interpreter was used.  Chest Pain   This is a new problem. The current episode started more than 2 days ago. The problem occurs constantly. The problem has not changed since onset.The pain is associated with exertion, breathing and coughing. The pain is present in the substernal region and lateral region. The pain is severe. The quality of the pain is described as exertional, heavy, pressure-like, pleuritic and dull. The pain does not radiate. Duration of episode(s) is 1 week. The symptoms are aggravated by exertion and deep breathing. Associated symptoms include cough, malaise/fatigue, nausea, shortness of breath and sputum production. Pertinent negatives include no abdominal pain, no back pain, no diaphoresis, no dizziness, no fever, no headaches, no hemoptysis, no leg pain, no lower extremity edema, no near-syncope, no numbness, no palpitations, no vomiting and no weakness. She has tried nothing for the symptoms. The treatment provided no relief.    Past Medical History:  Diagnosis Date  . Alterations of sensations, late effect of cerebrovascular disease(438.6)   . Arthritis    "all over"  . Backache, unspecified   . Body mass index 40.0-44.9, adult ([place])   . Carpal tunnel syndrome   . Dizziness and giddiness   . Esophageal reflux   . Generalized pain   . Headache    "had alot til I had pituitary tumor removed"  . Heart murmur   . Other abnormal glucose   . Other and unspecified hyperlipidemia   . Other malaise and fatigue   . Other postprocedural status(V45.89)   . Other symptoms involving cardiovascular system   . Sleep apnea    "did test;  they wanted me to get a CPAP but I never did get it" (04/14/2015)  . Stroke ([place]) 06/2003; 02/2010   "minor; minor" ; denies residual on 04/14/2015  . Unspecified disorder of the pituitary gland and its hypothalamic control   . Unspecified essential hypertension   . Unspecified visual disturbance     Patient Active Problem List   Diagnosis Date Noted  . Hypertensive emergency 02/22/2018  . Malignant hypertension 02/20/2018  . Shortness of breath 02/20/2018  . Stroke ([place])   . Chest pain 04/14/2015  . Right sided weakness 04/14/2015  . Hyperlipidemia   . Body mass index 40.0-44.9, adult ([place])   . Essential hypertension   . Blood glucose elevated   . Asthma   . OSA (obstructive sleep apnea) 05/06/2013    Past Surgical History:  Procedure Laterality Date  . CARPAL TUNNEL RELEASE Left ~ 2014  . JOINT REPLACEMENT    . TOTAL HIP ARTHROPLASTY Right 11/06/2010  . TRANSPHENOIDAL / TRANSNASAL HYPOPHYSECTOMY / RESECTION PITUITARY TUMOR  06/13/2012  . TUBAL LIGATION  1974     OB History   None      Home Medications    Prior to Admission medications   Medication Sig Start Date End Date Taking? Authorizing Provider  albuterol (PROVENTIL HFA;VENTOLIN HFA) 108 (90 Base) MCG/ACT inhaler Inhale 2 puffs into the lungs every 6 (six) hours as needed for wheezing. 02/24/18   Danford, Suann Larry, MD  amLODipine (NORVASC) 10 MG tablet Take 1  tablet (10 mg total) by mouth daily. 02/24/18   Danford, Suann Larry, MD  atorvastatin (LIPITOR) 80 MG tablet Take 1 tablet (80 mg total) by mouth daily. 02/24/18   Danford, Suann Larry, MD  cloNIDine (CATAPRES) 0.3 MG tablet Take 1 tablet (0.3 mg total) by mouth 2 (two) times daily. 02/24/18   Danford, Suann Larry, MD  clopidogrel (PLAVIX) 75 MG tablet Take 1 tablet (75 mg total) by mouth daily. 02/24/18   Danford, Suann Larry, MD  dextromethorphan-guaiFENesin (MUCINEX DM) 30-600 MG 12hr tablet Take 1 tablet by mouth 2 (two) times daily. 02/24/18    Danford, Suann Larry, MD  hydrochlorothiazide (HYDRODIURIL) 25 MG tablet Take 1 tablet (25 mg total) by mouth daily. 02/24/18   Danford, Suann Larry, MD  loratadine (CLARITIN) 10 MG tablet Take 10 mg by mouth daily.    [provider]  metoprolol succinate (TOPROL-XL) 25 MG 24 hr tablet Take 1 tablet (25 mg total) by mouth daily. 02/24/18   Danford, Suann Larry, MD  nitroGLYCERIN (NITROSTAT) 0.4 MG SL tablet Place 1 tablet (0.4 mg total) under the tongue every 5 (five) minutes as needed for chest pain (hold for SBP < 110). 04/16/15   Rai, Ripudeep K, MD  predniSONE (DELTASONE) 20 MG tablet Take 2 tablets (40 mg total) by mouth daily with breakfast. 02/25/18   Danford, Suann Larry, MD  traMADol (ULTRAM) 50 MG tablet Take 50 mg by mouth every 6 (six) hours as needed for pain. Reported on 02/09/2016    [provider]  valsartan (DIOVAN) 160 MG tablet Take 1 tablet (160 mg total) by mouth daily. 02/24/18   Danford, Suann Larry, MD    Family History Family History  Problem Relation Age of Onset  . Heart failure Mother   . Hypertension Mother   . Diabetes Father   . Hypertension Sister   . Heart failure Sister   . Hypertension Daughter     Social History Social History   Tobacco Use  . Smoking status: Current Some Day Smoker    Types: Cigarettes  . Smokeless tobacco: Never Used  Substance Use Topics  . Alcohol use: Yes    Alcohol/week: 0.0 standard drinks    Comment: rarely; "I don't drink.  But I like it."  . Drug use: No     Allergies   Morphine and related; Penicillins; Percocet [oxycodone-acetaminophen]; Ace inhibitors; Iodinated diagnostic agents; Iodine; Etodolac; and Vicodin [hydrocodone-acetaminophen]   Review of Systems Review of Systems  Constitutional: Positive for chills, fatigue and malaise/fatigue. Negative for diaphoresis and fever.  HENT: Negative for congestion.   Eyes: Negative for visual disturbance.  Respiratory: Positive for cough,  sputum production, chest tightness, shortness of breath and wheezing. Negative for hemoptysis, choking and stridor.   Cardiovascular: Positive for chest pain. Negative for palpitations, leg swelling and near-syncope.  Gastrointestinal: Positive for nausea. Negative for abdominal pain, constipation, diarrhea and vomiting.  Genitourinary: Positive for frequency. Negative for dysuria.  Musculoskeletal: Negative for back pain, neck pain and neck stiffness.  Skin: Negative for rash and wound.  Neurological: Negative for dizziness, weakness, light-headedness, numbness and headaches.  Psychiatric/Behavioral: Negative for agitation.  All other systems reviewed and are negative.    Physical Exam Updated Vital Signs BP (!) 234/110 (BP Location: Right Arm)   Pulse 73   Temp 99.8 F (37.7 C) (Oral)   Resp (!) 24   Ht 5\' 4"  (1.626 m)   Wt 110.7 kg   SpO2 97%   BMI 41.88 kg/m  Physical Exam  Constitutional: She appears well-developed and well-nourished.  Non-toxic appearance. She does not appear ill. No distress.  HENT:  Head: Normocephalic and atraumatic.  Nose: Nose normal.  Mouth/Throat: Oropharynx is clear and moist. No oropharyngeal exudate.  Eyes: Pupils are equal, round, and reactive to light. Conjunctivae and EOM are normal.  Neck: Normal range of motion. Neck supple.  Cardiovascular: Normal rate, regular rhythm and intact distal pulses.  No murmur heard. Pulmonary/Chest: Effort normal. Tachypnea noted. No respiratory distress. She has wheezes. She has no rhonchi. She has rales.  Abdominal: Soft. There is no tenderness.  Musculoskeletal: She exhibits edema. She exhibits no tenderness.       Right lower leg: She exhibits edema.       Left lower leg: She exhibits edema.  Neurological: She is alert. No sensory deficit. She exhibits normal muscle tone.  Skin: Skin is warm and dry. Capillary refill takes less than 2 seconds. No rash noted. She is not diaphoretic. No erythema.    Psychiatric: She has a normal mood and affect.  Nursing note and vitals reviewed.    ED Treatments / Results  Labs (all labs ordered are listed, but only abnormal results are displayed) Labs Reviewed  COMPREHENSIVE METABOLIC PANEL - Abnormal; Notable for the following components:      Result Value   Potassium 3.2 (*)    Glucose, Bld 109 (*)    Creatinine, Ser 1.04 (*)    Calcium 8.6 (*)    Total Protein 8.5 (*)    GFR calc non Af Amer 54 (*)    All other components within normal limits  BRAIN NATRIURETIC PEPTIDE - Abnormal; Notable for the following components:   B Natriuretic Peptide 113.7 (*)    All other components within normal limits  D-DIMER, QUANTITATIVE (NOT AT [facility]) - Abnormal; Notable for the following components:   D-Dimer, Quant 1.94 (*)    All other components within normal limits  URINE CULTURE  CBC WITH DIFFERENTIAL/PLATELET  TROPONIN I  URINALYSIS, ROUTINE W REFLEX MICROSCOPIC  TROPONIN I    EKG EKG Interpretation  Date/Time:  Tuesday July 01 2018 10:46:38 EDT Ventricular Rate:  72 PR Interval:  168 QRS Duration: 92 QT Interval:  420 QTC Calculation: 459 R Axis:   32 Text Interpretation:  Normal sinus rhythm Septal infarct , age undetermined T wave abnormality, consider inferolateral ischemia Abnormal ECG when compared to prior, t wave now upright in leads 2 3 and V3.  No STEMI Confirmed by Antony Blackbird [id number]) on 07/01/2018 11:24:34 AM   Radiology Dg Chest 2 View  Result Date: 07/01/2018 CLINICAL DATA:  Cough, shortness of breath EXAM: CHEST - 2 VIEW COMPARISON:  02/20/2018 FINDINGS: Mild cardiomegaly. Lingular atelectasis. Vascular congestion and interstitial prominence could reflect early interstitial edema. No effusions or acute bony abnormality. IMPRESSION: Cardiomegaly with vascular congestion. Increasing interstitial prominence could reflect early interstitial edema. Lingular atelectasis. Electronically Signed   By: Rolm Baptise M.D.   On:  07/01/2018 11:27    Procedures Procedures (including critical care time)  CRITICAL CARE Performed by: Gwenyth Allegra Morissa Obeirne Total critical care time: 35 minutes Critical care time was exclusive of separately billable procedures and treating other patients. Critical care was necessary to treat or prevent imminent or life-threatening deterioration. Critical care was time spent personally by me on the following activities: development of treatment plan with patient and/or surrogate as well as nursing, discussions with consultants, evaluation of patient's response to treatment, examination of patient, obtaining history from  patient or surrogate, ordering and performing treatments and interventions, ordering and review of laboratory studies, ordering and review of radiographic studies, pulse oximetry and re-evaluation of patient's condition.   Medications Ordered in ED Medications  ipratropium-albuterol (DUONEB) 0.5-2.5 (3) MG/3ML nebulizer solution 3 mL (3 mLs Nebulization Given 07/01/18 1148)  ipratropium-albuterol (DUONEB) 0.5-2.5 (3) MG/3ML nebulizer solution 3 mL (has no administration in time range)  furosemide (LASIX) injection 40 mg (has no administration in time range)  albuterol (PROVENTIL) (2.5 MG/3ML) 0.083% nebulizer solution (2.5 mg  Given 07/01/18 1056)  labetalol (NORMODYNE,TRANDATE) injection 10 mg (10 mg Intravenous Given 07/01/18 1430)  labetalol (NORMODYNE,TRANDATE) injection 10 mg (10 mg Intravenous Given 07/01/18 1526)     Initial Impression / Assessment and Plan / ED Course  I have reviewed the triage vital signs and the nursing notes.  Pertinent labs & imaging results that were available during my care of the patient were reviewed by me and considered in my medical decision making (see chart for details).     Sherry Fitzpatrick is a 67 y.o. female with a past medical history significant for hypertension with history of hypertensive emergency, asthma, stroke, obesity,  hyperlipidemia, sleep apnea, who presents with chest pain, shortness of breath, and cough.  Patient reports that for the last week she has had productive cough and shortness of breath with chest pain.  She reports the pain as tightness and pressure in her central chest.  She reports it is exertional and pleuritic.  She reports coughing up a white and yellow sputum.  She reports chills but no documented fevers at home.  She says she has not had pneumonia before but says this is how she is felt when she had hypertensive emergency in the past.  She reports no significant lower extremity edema.  She denies any constipation or diarrhea but does report some urinary frequency.  She denies any vaginal or pelvic symptoms.  She reports her discomfort does not radiate and she has no diaphoresis.  She reports nausea but no vomiting.  On exam, chest is nontender.  Patient has crackles and wheezing in all lung fields.  No significant rhonchi.  Abdomen is nontender.  Patient has minimal lower extremity edema.  Symmetric pulses in upper and lower extremities.  Clinically I suspect patient may have hypertensive emergency contributing to her shortness of breath.  Patient has crackles in the lungs, will have lab work to look for fluid overload.  Chest x-ray obtained showing no evidence of pneumonia but did show evidence of pulmonary edema.  Patient is on room air but has some tachypnea.  Patient will have a d-dimer given the exertional and pleuritic chest pain and shortness of breath in the absence of prior asthma or COPD.   Patient's blood pressure on reassessment was in the 160s which is much better than the 230s on arrival.   3:06 PM Her blood pressure trended back up into the 190s.  She was given labetalol.  It subsequently decreased in the 160s but then went back up in the 190s.  Patient still has chest pain and shortness of breath.  Patient was placed on 2 L nasal cannula when her oxygen was dropping into the upper  80s during conversation.  BNP returned at just over 100.  Troponin negative.  Mild hypokalemia.  CBC reassuring.  Doubt pneumonia.  Clinical I suspect patient is having hypertensive emergency causing her chest pain shortness of breath.  Patient did have an elevated d-dimer on her work-up however  chart review shows that she has an allergy to contrast.  Patient will likely need VQ scan for rule out of PE given the wheezing with no history of reactive airway disease.    With her new oxygen requirement as well as her blood pressure around 200 and likely hypertensive emergency, patient will be called and admitted for further management.    Final Clinical Impressions(s) / ED Diagnoses   Final diagnoses:  Precordial pain  Shortness of breath  Hypertensive emergency  Positive D dimer    ED Discharge Orders    None      Clinical Impression: 1. Precordial pain   2. Shortness of breath   3. Hypertensive emergency   4. Positive D dimer     Disposition: Admit  This note was prepared with assistance of Dragon voice recognition software. Occasional wrong-word or sound-a-like substitutions may have occurred due to the inherent limitations of voice recognition software.     Tashauna Caisse, Gwenyth Allegra, MD 07/01/18 [phone]

## 2018-07-01 NOTE — Progress Notes (Signed)
Patient arrived to 4E room 23 at this time. V/s done and telemetry applied. CCMD notified. CHG bath done. Patient oriented to room and how to call nurse with any needs.   Emelda Fear ,RN

## 2018-07-01 NOTE — ED Triage Notes (Signed)
Pt having sob and chest pain for one week.  Productive cough, white sputum.  Unknown fever.

## 2018-07-01 NOTE — Progress Notes (Signed)
Called by [facility] for transfer to [facility].  Patient is a 67yo female presenting with CP and SOB.  Hypertension emergency causing fluid overload.  Has positive D-dimer but contrast allergy, needs VQ scan.  Has no fevers but no chills.  CXR without PNA.  No h/o CHF, BNP 113, +LE edema.  BP was in 230s on arrival, improved with Labetolol and then increased and so given a second dose.  EKG ok.  O2 sats into the 80s with talking.  Will give a 40mg  dose of Lasix x 1.  Needs to go to SDU based on BPs.  Will place in observation status for now.  Carlyon Shadow, M.D.

## 2018-07-02 ENCOUNTER — Observation Stay (HOSPITAL_BASED_OUTPATIENT_CLINIC_OR_DEPARTMENT_OTHER): Payer: Medicare HMO

## 2018-07-02 ENCOUNTER — Observation Stay (HOSPITAL_COMMUNITY): Payer: Medicare HMO

## 2018-07-02 DIAGNOSIS — R7989 Other specified abnormal findings of blood chemistry: Secondary | ICD-10-CM | POA: Diagnosis not present

## 2018-07-02 DIAGNOSIS — I5033 Acute on chronic diastolic (congestive) heart failure: Secondary | ICD-10-CM

## 2018-07-02 DIAGNOSIS — I160 Hypertensive urgency: Secondary | ICD-10-CM | POA: Diagnosis not present

## 2018-07-02 DIAGNOSIS — R079 Chest pain, unspecified: Secondary | ICD-10-CM | POA: Diagnosis not present

## 2018-07-02 LAB — RAPID URINE DRUG SCREEN, HOSP PERFORMED
AMPHETAMINES: NOT DETECTED
Barbiturates: NOT DETECTED
Benzodiazepines: NOT DETECTED
COCAINE: NOT DETECTED
OPIATES: NOT DETECTED
Tetrahydrocannabinol: NOT DETECTED

## 2018-07-02 LAB — BASIC METABOLIC PANEL
Anion gap: 10 (ref 5–15)
BUN: 16 mg/dL (ref 8–23)
CALCIUM: 8.5 mg/dL — AB (ref 8.9–10.3)
CO2: 29 mmol/L (ref 22–32)
CREATININE: 1.3 mg/dL — AB (ref 0.44–1.00)
Chloride: 99 mmol/L (ref 98–111)
GFR calc non Af Amer: 41 mL/min — ABNORMAL LOW (ref >60)
GFR, EST AFRICAN AMERICAN: 47 mL/min — AB (ref >60)
GLUCOSE: 162 mg/dL — AB (ref 70–99)
Potassium: 3.6 mmol/L (ref 3.5–5.1)
Sodium: 138 mmol/L (ref 135–145)

## 2018-07-02 LAB — LIPID PANEL
Cholesterol: 169 mg/dL (ref 0–200)
HDL: 30 mg/dL — AB (ref >40)
LDL CALC: 120 mg/dL — AB (ref 0–99)
TRIGLYCERIDES: 95 mg/dL (ref <150)
Total CHOL/HDL Ratio: 5.6 RATIO
VLDL: 19 mg/dL (ref 0–40)

## 2018-07-02 LAB — HIV ANTIBODY (ROUTINE TESTING W REFLEX): HIV Screen 4th Generation wRfx: NONREACTIVE

## 2018-07-02 LAB — ECHOCARDIOGRAM COMPLETE
HEIGHTINCHES: 64 in
Weight: 3813.08 oz

## 2018-07-02 LAB — MAGNESIUM: Magnesium: 2.2 mg/dL (ref 1.7–2.4)

## 2018-07-02 LAB — URINE CULTURE: Culture: NO GROWTH

## 2018-07-02 MED ORDER — IOPAMIDOL (ISOVUE-370) INJECTION 76%
100.0000 mL | Freq: Once | INTRAVENOUS | Status: AC | PRN
  Administered 2018-07-02: 100 mL via INTRAVENOUS

## 2018-07-02 MED ORDER — IOPAMIDOL (ISOVUE-370) INJECTION 76%
INTRAVENOUS | Status: AC
  Filled 2018-07-02: qty 100

## 2018-07-02 MED ORDER — ORAL CARE MOUTH RINSE
15.0000 mL | Freq: Two times a day (BID) | OROMUCOSAL | Status: DC
  Administered 2018-07-03 – 2018-07-04 (×3): 15 mL via OROMUCOSAL

## 2018-07-02 NOTE — Care Management Obs Status (Signed)
[facility] NOTIFICATION   Patient Details  Name: Sherry Fitzpatrick MRN: [id number] Date of Birth: [birth year]   Medicare Observation Status Notification Given:  Yes    Carles Collet, RN 07/02/2018, 4:21 PM

## 2018-07-02 NOTE — Progress Notes (Signed)
  Echocardiogram 2D Echocardiogram has been performed.  Sherry Fitzpatrick [name] 07/02/2018, 10:59 AM

## 2018-07-02 NOTE — Progress Notes (Addendum)
Dr. Verlon Au notified at this time about patient's BP. Patient asymptomatic. New orders received.   Emelda Fear, RN

## 2018-07-02 NOTE — Progress Notes (Signed)
[facility] PROGRESS NOTE  Sherry Fitzpatrick IRJ:[id number] DOB: 06-19-1951 DOA: 07/01/2018 PCP: Lilian Coma., MD Has seen neurologist Dr. Soundra Pilon AR in the outpatient setting for sleep apnea and titration of the same  Narrative:  67 f HTN, HLD, CVA x2-prior TIA 2004, 2011 and last one was in 04/16/2015, reflux, continued tobacco, OSA not using CPAP, right hip osteoarthritis status post surgery Pituitary adenoma status post surgery, dCHF, allegedly negative Myoview 2007 Last admitted to hospitalist service 5/9-02/1318-at the admission hypertensive emergency, chest pain and was found to have AKI and a rhinoviral infection  Admit [facility] 9/17 CP 4-9/10 exaggerated exertion, deep breathing, no unilateral tingling or numbness-blood pressure 230/110-Rx labetalol, Lasix, stated compliance  Work-up revealed BNP elevated 113 UA negative d-dimer 1.9 K3.2 creatinine 1.0 CXR = interstitial edema vascular congestion Stepdown bed requested   A & Plan  htn emergency-ran out clonidine and other meds-suspect noncompliant at baseline regimen possible on d/c home--has hypotension x 1 today monitor trends-bmet in am Chest pain-no concern for angina-monitor Probable diastolic heart failure-diuresis with 40 mg lasix daily HTN-cont avapro hctz norvasc metoprolol xl-diuresis as above Pituitary adenoma status post surgery Allegedly negative Myoview 2007 OSA not using CPAP-follows with neurology-outpatient consideration of the same-last echo 02/22/2018 did not confirm elevated PA peak Continue tobacco abuse-pre-contemplative ckd ii-iii-monitor labs am-received contrast for PE sudy      DVT prophylaxis: lovenox  Code Status: full   Family Communication:  D/w husband   Disposition Plan: ip    Verlon Au, MD  [facility] Direct contact: [phone] --Via amion app OR  --[email or web address]; password [email or web address]  7PM-7AM contact night coverage as above 07/02/2018, 7:46 AM  LOS: 0 days    Consultants:  no  Procedures: ECHO - Left ventricle: The cavity size was normal. Wall thickness was  increased in a pattern of mild LVH. Systolic function was  vigorous. The estimated ejection fraction was in the range of 65%  to 70%. Wall motion was normal; there were no regional wall  motion abnormalities. Features are consistent with a pseudonormal  left ventricular filling pattern, with concomitant abnormal  relaxation and increased filling pressure (grade 2 diastolic  dysfunction).  CT chest and Duplex LE neg for DVT  Antimicrobials:  no  Interval history/Subjective: Awake alert pleasant no distress tells me he ran out of blood pressure meds prior to coming in-I get the feeling that the patient does not take her meds regularly-eating drinking okay-boyfriend at bedside  Objective:  Vitals:  Vitals:   07/02/18 0006 07/02/18 0400  BP: (!) 154/80 131/71  Pulse: 68 65  Resp: 18 20  Temp: 98.7 F (37.1 C)   SpO2: 98% 97%    Exam:  Awake alert pleasant no distress EOMI NCAT Thick neck Mallampati 4 S1-S2 possible murmur across precordium Abdomen soft nontender nondistended no rebound No lower extremity edema Neurologically intact Psych euthymic    I have personally reviewed the following:   Labs:  BUN/creatinine 16/1.3  LDL 120 HDL 30   Imaging studies:  As above  Medical tests:  As above  Test discussed with performing physician:  As above  Decision to obtain old records:  Yes  Review and summation of old records:  Yes  Scheduled Meds: . amLODipine  10 mg Oral Daily  . aspirin EC  81 mg Oral Daily  . atorvastatin  80 mg Oral Daily  . cloNIDine  0.3 mg Oral Q8H  . clopidogrel  75 mg Oral Daily  . dextromethorphan-guaiFENesin  1 tablet  Oral BID  . enoxaparin (LOVENOX) injection  40 mg Subcutaneous Q24H  . furosemide  40 mg Intravenous Daily  . hydrochlorothiazide  25 mg Oral Daily  . iopamidol      . irbesartan  150 mg Oral Daily  .  loratadine  10 mg Oral Daily  . mouth rinse  15 mL Mouth Rinse BID  . metoprolol succinate  25 mg Oral Daily  . nicotine  21 mg Transdermal Daily  . sodium chloride flush  3 mL Intravenous Q12H   Continuous Infusions: . sodium chloride      Principal Problem:   Hypertensive urgency Active Problems:   Chest pain   Hyperlipidemia   Essential hypertension   Stroke (HCC)   Tobacco abuse   Hypokalemia   Acute on chronic diastolic CHF (congestive heart failure) (HCC)   Positive D dimer   LOS: 0 days

## 2018-07-02 NOTE — Progress Notes (Signed)
*  Preliminary Results* Bilateral lower extremity venous duplex completed. Bilateral lower extremities are negative for deep vein thrombosis. There is no evidence of Baker's cyst bilaterally.  07/02/2018 2:56 PM Maudry Mayhew, MHA, RVT, RDCS, RDMS

## 2018-07-03 DIAGNOSIS — E785 Hyperlipidemia, unspecified: Secondary | ICD-10-CM | POA: Diagnosis present

## 2018-07-03 DIAGNOSIS — Z8249 Family history of ischemic heart disease and other diseases of the circulatory system: Secondary | ICD-10-CM | POA: Diagnosis not present

## 2018-07-03 DIAGNOSIS — N179 Acute kidney failure, unspecified: Secondary | ICD-10-CM

## 2018-07-03 DIAGNOSIS — M1990 Unspecified osteoarthritis, unspecified site: Secondary | ICD-10-CM | POA: Diagnosis present

## 2018-07-03 DIAGNOSIS — Z880 Allergy status to penicillin: Secondary | ICD-10-CM | POA: Diagnosis not present

## 2018-07-03 DIAGNOSIS — I110 Hypertensive heart disease with heart failure: Secondary | ICD-10-CM | POA: Diagnosis present

## 2018-07-03 DIAGNOSIS — K5900 Constipation, unspecified: Secondary | ICD-10-CM | POA: Diagnosis not present

## 2018-07-03 DIAGNOSIS — Z8673 Personal history of transient ischemic attack (TIA), and cerebral infarction without residual deficits: Secondary | ICD-10-CM | POA: Diagnosis not present

## 2018-07-03 DIAGNOSIS — Z91041 Radiographic dye allergy status: Secondary | ICD-10-CM | POA: Diagnosis not present

## 2018-07-03 DIAGNOSIS — Z885 Allergy status to narcotic agent status: Secondary | ICD-10-CM | POA: Diagnosis not present

## 2018-07-03 DIAGNOSIS — I160 Hypertensive urgency: Secondary | ICD-10-CM | POA: Diagnosis not present

## 2018-07-03 DIAGNOSIS — I161 Hypertensive emergency: Secondary | ICD-10-CM | POA: Diagnosis present

## 2018-07-03 DIAGNOSIS — Z79899 Other long term (current) drug therapy: Secondary | ICD-10-CM | POA: Diagnosis not present

## 2018-07-03 DIAGNOSIS — K219 Gastro-esophageal reflux disease without esophagitis: Secondary | ICD-10-CM | POA: Diagnosis present

## 2018-07-03 DIAGNOSIS — Z6841 Body Mass Index (BMI) 40.0 and over, adult: Secondary | ICD-10-CM | POA: Diagnosis not present

## 2018-07-03 DIAGNOSIS — F17210 Nicotine dependence, cigarettes, uncomplicated: Secondary | ICD-10-CM | POA: Diagnosis present

## 2018-07-03 DIAGNOSIS — I248 Other forms of acute ischemic heart disease: Secondary | ICD-10-CM | POA: Diagnosis present

## 2018-07-03 DIAGNOSIS — R0602 Shortness of breath: Secondary | ICD-10-CM | POA: Diagnosis present

## 2018-07-03 DIAGNOSIS — Z96641 Presence of right artificial hip joint: Secondary | ICD-10-CM | POA: Diagnosis present

## 2018-07-03 DIAGNOSIS — Z888 Allergy status to other drugs, medicaments and biological substances status: Secondary | ICD-10-CM | POA: Diagnosis not present

## 2018-07-03 DIAGNOSIS — Z7952 Long term (current) use of systemic steroids: Secondary | ICD-10-CM | POA: Diagnosis not present

## 2018-07-03 DIAGNOSIS — I5033 Acute on chronic diastolic (congestive) heart failure: Secondary | ICD-10-CM | POA: Diagnosis present

## 2018-07-03 DIAGNOSIS — G4733 Obstructive sleep apnea (adult) (pediatric): Secondary | ICD-10-CM | POA: Diagnosis present

## 2018-07-03 DIAGNOSIS — Z7902 Long term (current) use of antithrombotics/antiplatelets: Secondary | ICD-10-CM | POA: Diagnosis not present

## 2018-07-03 DIAGNOSIS — E876 Hypokalemia: Secondary | ICD-10-CM | POA: Diagnosis present

## 2018-07-03 HISTORY — DX: Acute kidney failure, unspecified: N17.9

## 2018-07-03 LAB — BASIC METABOLIC PANEL
ANION GAP: 13 (ref 5–15)
BUN: 32 mg/dL — ABNORMAL HIGH (ref 8–23)
CALCIUM: 8.6 mg/dL — AB (ref 8.9–10.3)
CHLORIDE: 98 mmol/L (ref 98–111)
CO2: 26 mmol/L (ref 22–32)
Creatinine, Ser: 1.83 mg/dL — ABNORMAL HIGH (ref 0.44–1.00)
GFR calc Af Amer: 31 mL/min — ABNORMAL LOW (ref >60)
GFR calc non Af Amer: 27 mL/min — ABNORMAL LOW (ref >60)
GLUCOSE: 128 mg/dL — AB (ref 70–99)
Potassium: 3.9 mmol/L (ref 3.5–5.1)
Sodium: 137 mmol/L (ref 135–145)

## 2018-07-03 LAB — HEMOGLOBIN A1C
HEMOGLOBIN A1C: 6.5 % — AB (ref 4.8–5.6)
Mean Plasma Glucose: 140 mg/dL

## 2018-07-03 MED ORDER — FUROSEMIDE 40 MG PO TABS
40.0000 mg | ORAL_TABLET | Freq: Two times a day (BID) | ORAL | Status: DC
  Administered 2018-07-03 – 2018-07-06 (×7): 40 mg via ORAL
  Filled 2018-07-03 (×7): qty 1

## 2018-07-03 MED ORDER — SORBITOL 70 % SOLN
30.0000 mL | Freq: Every day | Status: DC
  Filled 2018-07-03 (×4): qty 30

## 2018-07-03 NOTE — Progress Notes (Signed)
[facility] PROGRESS NOTE  [name] XHB:[id number] DOB: 03-14-1951 DOA: 07/01/2018 PCP: Sherry Coma., MD Has seen neurologist Dr. Soundra Fitzpatrick AR in the outpatient setting for sleep apnea and titration of the same  Narrative:  67 f HTN, HLD, CVA x2-prior TIA 2004, 2011 and last one was in 04/16/2015, reflux, continued tobacco, OSA not using CPAP, right hip osteoarthritis status post surgery Pituitary adenoma status post surgery, dCHF, allegedly negative Myoview 2007 Last admitted to hospitalist service 5/9-02/1318-at the admission hypertensive emergency, chest pain and was found to have AKI and a rhinoviral infection  Admit [facility] 9/17 CP 4-9/10 exaggerated exertion, deep breathing, no unilateral tingling or numbness-blood pressure 230/110-Rx labetalol, Lasix, stated compliance  Work-up revealed BNP elevated 113 UA negative d-dimer 1.9 K3.2 creatinine 1.0 CXR = interstitial edema vascular congestion  Basleine wght per patient ~ 241 Stabilized but developed mild AKI   A & Plan   htn emergency-ran out clonidine and other meds-suspect noncompliant at baseline regimen possible  At home--Cut back meds as below--DO NOT give clonidine on d/c  AKI/ckd ii-iii-note bump in RF-received contrast for PE study--adjusting meds--hopefull will plateu  Chest pain-no concern for angina-monitor  diastolic heart failure-diuresis with 40 mg lasix po bid--cut back HCTZ and AMlodipine--kept ARB and Metoprolol--current wght 109 KG=239 pounds--I/o not measured  Pituitary adenoma status post surgery  Allegedly negative Myoview 2007  OSA not using CPAP-follows with neurology-outpatient consideration of the same-last echo 02/22/2018 did not confirm elevated PA peak  Continue tobacco abuse-pre-contemplative  Constipation-sorbitol    DVT prophylaxis: lovenox  Code Status: full   Family Communication:  None here today Disposition Plan: ip    Sherry Au, MD  [facility] Direct contact:  [phone] --Via amion app OR  --[email or web address]; password [email or web address]  7PM-7AM contact night coverage as above 07/03/2018, 9:55 AM  LOS: 0 days   Consultants:  no  Procedures: ECHO - Left ventricle: The cavity size was normal. Wall thickness was  increased in a pattern of mild LVH. Systolic function was  vigorous. The estimated ejection fraction was in the range of 65%  to 70%. Wall motion was normal; there were no regional wall  motion abnormalities. Features are consistent with a pseudonormal  left ventricular filling pattern, with concomitant abnormal  relaxation and increased filling pressure (grade 2 diastolic  dysfunction).  CT chest and Duplex LE neg for DVT  Antimicrobials:  no  Interval history/Subjective: 'coughing some sputum Tells me baseline wght is 241?? Currently 216 Clothes have been "tight" for a cpl weeks pewr patint Ambulating without issue to RR Sleeps at home with no pillow?S Here on 30 degree incline  Objective:  Vitals:  Vitals:   07/02/18 2325 07/03/18 0414  BP: 131/68 (!) 149/76  Pulse:  (!) 59  Resp: 18 18  Temp: 99 F (37.2 C) 98.6 F (37 C)  SpO2: 100% 100%    Exam:  Awake alert pleasant no distress EOMI NCAT-JVD 4 Thick neck Mallampati 4 S1-S2 possible murmur across precordium Abdomen soft nontender nondistended no rebound--has some tende rin LLQ No lower extremity edema Neurologically intact Psych euthymic    I have personally reviewed the following:   Labs:  BUN/creatinine 16/1.3--->32/1.83  LDL 120 HDL 30  Imaging studies:  As above  Medical tests:  As above  Test discussed with performing physician:  As above  Decision to obtain old records:  Yes  Review and summation of old records:  Yes  Scheduled Meds: . aspirin EC  81 mg Oral Daily  .  atorvastatin  80 mg Oral Daily  . clopidogrel  75 mg Oral Daily  . dextromethorphan-guaiFENesin  1 tablet Oral BID  . enoxaparin (LOVENOX) injection  40 mg  Subcutaneous Q24H  . furosemide  40 mg Oral BID  . irbesartan  150 mg Oral Daily  . loratadine  10 mg Oral Daily  . mouth rinse  15 mL Mouth Rinse BID  . metoprolol succinate  25 mg Oral Daily  . nicotine  21 mg Transdermal Daily  . sodium chloride flush  3 mL Intravenous Q12H  . sorbitol  30 mL Oral Daily   Continuous Infusions: . sodium chloride      Principal Problem:   Hypertensive urgency Active Problems:   Chest pain   Hyperlipidemia   Essential hypertension   Stroke (HCC)   Tobacco abuse   Hypokalemia   Acute on chronic diastolic CHF (congestive heart failure) (HCC)   Positive D dimer   LOS: 0 days

## 2018-07-04 DIAGNOSIS — I160 Hypertensive urgency: Secondary | ICD-10-CM

## 2018-07-04 LAB — RENAL FUNCTION PANEL
ALBUMIN: 3.3 g/dL — AB (ref 3.5–5.0)
Anion gap: 12 (ref 5–15)
BUN: 18 mg/dL (ref 8–23)
CALCIUM: 8.7 mg/dL — AB (ref 8.9–10.3)
CO2: 30 mmol/L (ref 22–32)
Chloride: 97 mmol/L — ABNORMAL LOW (ref 98–111)
Creatinine, Ser: 1.16 mg/dL — ABNORMAL HIGH (ref 0.44–1.00)
GFR, EST AFRICAN AMERICAN: 54 mL/min — AB (ref >60)
GFR, EST NON AFRICAN AMERICAN: 47 mL/min — AB (ref >60)
GLUCOSE: 116 mg/dL — AB (ref 70–99)
PHOSPHORUS: 3.4 mg/dL (ref 2.5–4.6)
POTASSIUM: 3.3 mmol/L — AB (ref 3.5–5.1)
SODIUM: 139 mmol/L (ref 135–145)

## 2018-07-04 LAB — BASIC METABOLIC PANEL
ANION GAP: 14 (ref 5–15)
BUN: 18 mg/dL (ref 8–23)
CHLORIDE: 95 mmol/L — AB (ref 98–111)
CO2: 30 mmol/L (ref 22–32)
Calcium: 8.7 mg/dL — ABNORMAL LOW (ref 8.9–10.3)
Creatinine, Ser: 1.16 mg/dL — ABNORMAL HIGH (ref 0.44–1.00)
GFR calc non Af Amer: 47 mL/min — ABNORMAL LOW (ref >60)
GFR, EST AFRICAN AMERICAN: 54 mL/min — AB (ref >60)
Glucose, Bld: 115 mg/dL — ABNORMAL HIGH (ref 70–99)
POTASSIUM: 3.3 mmol/L — AB (ref 3.5–5.1)
SODIUM: 139 mmol/L (ref 135–145)

## 2018-07-04 MED ORDER — POTASSIUM CHLORIDE CRYS ER 20 MEQ PO TBCR
40.0000 meq | EXTENDED_RELEASE_TABLET | Freq: Once | ORAL | Status: AC
  Administered 2018-07-04: 40 meq via ORAL
  Filled 2018-07-04: qty 2

## 2018-07-04 NOTE — Progress Notes (Signed)
[facility] PROGRESS NOTE  Sherry Fitzpatrick DGU:[id number] DOB: 1951/01/03 DOA: 07/01/2018 PCP: Lilian Coma., MD   Has seen neurologist Dr. Soundra Pilon AR in the outpatient setting for sleep apnea and titration of the same  Narrative: 67 f HTN, HLD, CVA x2-prior TIA 2004, 2011 and last one was in 04/16/2015, reflux, continued tobacco, OSA not using CPAP, right hip osteoarthritis status post surgery Pituitary adenoma status post surgery, dCHF, allegedly negative Myoview 2007 Last admitted to hospitalist service 5/9-02/1318-at the admission hypertensive emergency, chest pain and was found to have AKI and a rhinoviral infection  Admit [facility] 9/17 CP 4-9/10 exaggerated exertion, deep breathing, no unilateral tingling or numbness-blood pressure 230/110-Rx labetalol, Lasix, stated compliance  Work-up revealed BNP elevated 113 UA negative d-dimer 1.9 K3.2 creatinine 1.0 CXR = interstitial edema vascular congestion  Basleine wght per patient ~ 241 Stabilized but developed mild AKI -------------------------------------------------------------------------------------------------------------------------------------------------------- Principal Problem:   Hypertensive urgency Active Problems:   Chest pain   Hyperlipidemia   Essential hypertension   Stroke ([place])   Tobacco abuse   Hypokalemia   Acute on chronic diastolic CHF (congestive heart failure) (HCC)   Positive D dimer   AKI (acute kidney injury) ([place])  Subjective:  She was seen and examined this morning, complaining of massive upper lower thigh area pain unable to ambulate, distress, diaphoretic but denies any chest pain or shortness of breath. Sitting unable to ambulate at this time.  Labs were reviewed potassium was mildly low, blood pressure was elevated this morning.  A & Plan   htn emergency-ran out clonidine and other meds-suspect noncompliant at baseline regimen possible  At home--Cut back meds as below--DO NOT give clonidine on  d/c       Blood pressure mildly elevated this morning, likely due to stress, lower leg pain, monitor closely Hypokalemia -     Place potassium, will ambulate h       AKI/ckd ii-iii-note bump in RF-received contrast for PE study--adjusting meds--hopefull will plateu  Chest pain-no concern for angina-monitor   diastolic heart failure-diuresis with 40 mg lasix po bid--cut back HCTZ and AMlodipine--kept ARB and Metoprolol--current wght 109 KG=239 pounds--I/o not measured  Pituitary adenoma status post surgery        Allegedly negative Myoview 2007   OSA not using CPAP-follows with neurology-outpatient consideration of the same-last echo 02/22/2018 did not confirm elevated PA peak   Continue tobacco abuse-pre-contemplative  Constipation-sorbitol    DVT prophylaxis: lovenox  Code Status: full   Family Communication:  None here today Disposition Plan: ip    Verlon Au, MD  [facility] Direct contact: [phone] --Via amion app OR  --[email or web address]; password [email or web address]  7PM-7AM contact night coverage as above 07/04/2018, 11:17 AM  LOS: 1 day   Consultants:  no  Procedures: ECHO - Left ventricle: The cavity size was normal. Wall thickness was  increased in a pattern of mild LVH. Systolic function was  vigorous. The estimated ejection fraction was in the range of 65%  to 70%. Wall motion was normal; there were no regional wall  motion abnormalities. Features are consistent with a pseudonormal  left ventricular filling pattern, with concomitant abnormal  relaxation and increased filling pressure (grade 2 diastolic  dysfunction).  CT chest and Duplex LE neg for DVT  Antimicrobials:  no Objective:  Vitals:  Vitals:   07/04/18 0545 07/04/18 0815  BP: (!) 157/57 (!) 158/73  Pulse:    Resp:  (!) 22  Temp:  97.9 F (36.6 C)  SpO2:  99%  Exam: BP (!) 158/73 (BP Location: Left Arm)   Pulse 65   Temp 97.9 F (36.6 C) (Oral)   Resp (!) 22   Ht 5\' 4"  (1.626 m)   Wt  108.9 kg   SpO2 99%   BMI 41.21 kg/m    Physical Exam  Constitution:  Alert, cooperative, no distress,  Psychiatric: Normal and stable mood and affect, cognition intact,   HEENT: Normocephalic, PERRL, otherwise with in Normal limits  Chest:Chest symmetric Cardio vascular:  S1/S2, RRR, No murmure, No Rubs or Gallops  pulmonary: Clear to auscultation bilaterally, respirations unlabored, negative wheezes / crackles Abdomen: Soft, non-tender, non-distended, bowel sounds,no masses, no organomegaly Muscular skeletal: Limited exam - in bed, able to move all 4 extremities, Normal strength,  Neuro: CNII-XII intact. , normal motor and sensation, reflexes intact  Extremities: No pitting edema lower extremities, +2 pulses, bilateral upper thigh tenderness Skin: Dry, warm to touch, negative for any Rashes, No open wounds Wounds: per nursing documentation   I have personally reviewed the following:   Labs:  BUN/creatinine 16/1.3--->32/1.83--->18/1.116  LDL 120 HDL 30  Scheduled Meds: . aspirin EC  81 mg Oral Daily  . atorvastatin  80 mg Oral Daily  . clopidogrel  75 mg Oral Daily  . dextromethorphan-guaiFENesin  1 tablet Oral BID  . enoxaparin (LOVENOX) injection  40 mg Subcutaneous Q24H  . furosemide  40 mg Oral BID  . irbesartan  150 mg Oral Daily  . loratadine  10 mg Oral Daily  . mouth rinse  15 mL Mouth Rinse BID  . metoprolol succinate  25 mg Oral Daily  . nicotine  21 mg Transdermal Daily  . sodium chloride flush  3 mL Intravenous Q12H  . sorbitol  30 mL Oral Daily   Continuous Infusions: . sodium chloride      LOS: 1 day

## 2018-07-04 NOTE — Plan of Care (Signed)

## 2018-07-05 ENCOUNTER — Inpatient Hospital Stay (HOSPITAL_COMMUNITY): Payer: Medicare HMO

## 2018-07-05 LAB — URINALYSIS, ROUTINE W REFLEX MICROSCOPIC
Bilirubin Urine: NEGATIVE
Glucose, UA: NEGATIVE mg/dL
HGB URINE DIPSTICK: NEGATIVE
Ketones, ur: NEGATIVE mg/dL
Leukocytes, UA: NEGATIVE
Nitrite: NEGATIVE
Protein, ur: NEGATIVE mg/dL
Specific Gravity, Urine: 1.012 (ref 1.005–1.030)
pH: 8 (ref 5.0–8.0)

## 2018-07-05 LAB — CBC WITH DIFFERENTIAL/PLATELET
ABS IMMATURE GRANULOCYTES: 0.1 10*3/uL (ref 0.0–0.1)
BASOS ABS: 0.1 10*3/uL (ref 0.0–0.1)
Basophils Relative: 1 %
Eosinophils Absolute: 0.4 10*3/uL (ref 0.0–0.7)
Eosinophils Relative: 4 %
HCT: 39.2 % (ref 36.0–46.0)
Hemoglobin: 12.8 g/dL (ref 12.0–15.0)
Immature Granulocytes: 1 %
LYMPHS PCT: 38 %
Lymphs Abs: 3.5 10*3/uL (ref 0.7–4.0)
MCH: 29.2 pg (ref 26.0–34.0)
MCHC: 32.7 g/dL (ref 30.0–36.0)
MCV: 89.3 fL (ref 78.0–100.0)
MONO ABS: 0.8 10*3/uL (ref 0.1–1.0)
Monocytes Relative: 9 %
Neutro Abs: 4.5 10*3/uL (ref 1.7–7.7)
Neutrophils Relative %: 47 %
Platelets: 403 10*3/uL — ABNORMAL HIGH (ref 150–400)
RBC: 4.39 MIL/uL (ref 3.87–5.11)
RDW: 14.6 % (ref 11.5–15.5)
WBC: 9.4 10*3/uL (ref 4.0–10.5)

## 2018-07-05 LAB — BASIC METABOLIC PANEL
ANION GAP: 12 (ref 5–15)
BUN: 19 mg/dL (ref 8–23)
CALCIUM: 8.5 mg/dL — AB (ref 8.9–10.3)
CO2: 27 mmol/L (ref 22–32)
Chloride: 100 mmol/L (ref 98–111)
Creatinine, Ser: 1.11 mg/dL — ABNORMAL HIGH (ref 0.44–1.00)
GFR calc Af Amer: 57 mL/min — ABNORMAL LOW (ref >60)
GFR, EST NON AFRICAN AMERICAN: 49 mL/min — AB (ref >60)
GLUCOSE: 132 mg/dL — AB (ref 70–99)
POTASSIUM: 4.4 mmol/L (ref 3.5–5.1)
SODIUM: 139 mmol/L (ref 135–145)

## 2018-07-05 MED ORDER — AMLODIPINE BESYLATE 5 MG PO TABS
5.0000 mg | ORAL_TABLET | Freq: Every day | ORAL | Status: DC
  Administered 2018-07-05: 5 mg via ORAL
  Filled 2018-07-05: qty 1

## 2018-07-05 MED ORDER — AMLODIPINE BESYLATE 10 MG PO TABS
10.0000 mg | ORAL_TABLET | Freq: Every day | ORAL | Status: DC
  Administered 2018-07-06: 10 mg via ORAL
  Filled 2018-07-05: qty 1

## 2018-07-05 MED ORDER — FUROSEMIDE 40 MG PO TABS: 40.0000 mg | ORAL_TABLET | Freq: Two times a day (BID) | ORAL | 1 refills | Status: DC

## 2018-07-05 MED ORDER — POTASSIUM CHLORIDE ER 10 MEQ PO TBCR: 20.0000 meq | EXTENDED_RELEASE_TABLET | Freq: Every day | ORAL | 1 refills | Status: DC

## 2018-07-05 MED ORDER — FUROSEMIDE 10 MG/ML IJ SOLN
20.0000 mg | Freq: Once | INTRAMUSCULAR | Status: AC
  Administered 2018-07-05: 20 mg via INTRAVENOUS
  Filled 2018-07-05: qty 2

## 2018-07-05 NOTE — Progress Notes (Signed)
Pt called out states "Im short of breath". O2: 98 on 2L placed BP: 142/72. HR: 73. Pt in no pain. Pt states"Feels discomfort from SOB". Respiratory paged for breathing treatment. Will continue to monitor. Cato Mulligan RN

## 2018-07-05 NOTE — Progress Notes (Signed)
Pt ambulated from room to nurses station. Pt became very SOB had to stop numerous times O2 level 95 RA.

## 2018-07-05 NOTE — Discharge Summary (Signed)
5         Physician Discharge Summary [facility]    Patient: Sherry Fitzpatrick                   Admit date: 07/01/2018   DOB: 11-Jul-1951             Discharge date:07/05/2018/11:01 AM [id number]:[id number]                           PCP: Lilian Coma., MD  Recommendations for Outpatient Follow-up:   . Follow up: PCP, cardiology, heart failure clinic  Discharge Condition: Stable   Code Status:   Code Status: Full Code  Diet recommendation: Cardiac diet   Discharge Diagnoses:    Principal Problem:   Hypertensive urgency Active Problems:   Chest pain   Hyperlipidemia   Essential hypertension   Stroke ([place])   Tobacco abuse   Hypokalemia   Acute on chronic diastolic CHF (congestive heart failure) ([place])   Positive D dimer   AKI (acute kidney injury) ([place])   History of Present Illness/ Hospital Course Sherry Fitzpatrick Summary:    67 f HTN, HLD, CVA x2-prior TIA 2004, 2011 and last one was in 04/16/2015, reflux, continued tobacco, OSA not using CPAP, right hip osteoarthritis status post surgery Pituitary adenoma status post surgery, dCHF, allegedly negative Myoview 2007 Last admitted to hospitalist service 5/9-02/1318-at the admission hypertensive emergency, chest pain and was found to have AKI and a rhinoviral infection  Admit [facility] 9/17 CP 4-9/10 exaggerated exertion, deep breathing, no unilateral tingling or numbness-blood pressure 230/110-Rx labetalol, Lasix, stated compliance  Work-up revealed BNP elevated 113 UA negative d-dimer 1.9 K3.2 creatinine 1.0 CXR = interstitial edema vascular congestion  Patient was diuresed aggressively now on Lasix 40 mg p.o. twice daily, continue home medication of amlodipine, oral, metoprolol, blood pressure has improved, HCTZ DC'd  Developed some cramping with mild hypokalemia, additives supplement potassium was added, hemoglobin daily.  Basleine wght per patient ~ 241 (on admission weight 244 lbs --> today 240 lbs) Stabilized but developed  mild AKI  She is cleared for discharge home today  Detailed hospital course please see below:   --------------------------------------------------------------------------------------------------------------------------------------------------------  Labs were reviewed potassium was mildly low, blood pressure was elevated this morning.   htn emergency-ran out clonidine and other meds-suspect noncompliant at baseline regimen possible  At home--Cut back meds as below--DO NOT give clonidine on d/c       Blood pressure mildly elevated this morning, likely due to stress, lower leg pain, monitor closely Hypokalemia -     Place potassium, will ambulate h       AKI/ckd ii-iii-note bump in RF-received contrast for PE study--adjusting meds--hopefull will plateu  Chest pain-no concern for angina-monitor   diastolic heart failure-diuresis with 40 mg lasix po bid-- D/C HCTZ, cont  Amlodipine,  ARB and Metoprolol-  Pituitary adenoma - status post surgery        Allegedly negative Myoview 2007   OSA not using CPAP-follows with neurology-outpatient consideration of the same-last echo 02/22/2018 did not confirm elevated PA peak   Continue tobacco abuse-pre-contemplative  Constipation-sorbitol  Procedures: No admission procedures for hospital encounter.   Discharge Instructions:   Discharge Instructions    (HEART FAILURE PATIENTS) Call MD:  Anytime you have any of the following symptoms: 1) 3 pound weight gain in 24 hours or 5 pounds in 1 week 2) shortness of breath, with or without a dry hacking cough 3)  swelling in the hands, feet or stomach 4) if you have to sleep on extra pillows at night in order to breathe.   Complete by:  As directed    AMB referral to CHF clinic   Complete by:  As directed    Activity as tolerated - No restrictions   Complete by:  As directed    Diet - low sodium heart healthy   Complete by:  As directed    Discharge instructions   Complete by:  As  directed    Check your blood pressure daily, any excessive cramping may take additional tablets of KCl Avoid caffeine, high-dose salt diet   Increase activity slowly   Complete by:  As directed        Medication List    STOP taking these medications   cloNIDine 0.3 MG tablet Commonly known as:  CATAPRES   hydrochlorothiazide 25 MG tablet Commonly known as:  HYDRODIURIL   loratadine 10 MG tablet Commonly known as:  CLARITIN   predniSONE 20 MG tablet Commonly known as:  DELTASONE     TAKE these medications   albuterol 108 (90 Base) MCG/ACT inhaler Commonly known as:  PROVENTIL HFA;VENTOLIN HFA Inhale 2 puffs into the lungs every 6 (six) hours as needed for wheezing.   amLODipine 10 MG tablet Commonly known as:  NORVASC Take 1 tablet (10 mg total) by mouth daily.   atorvastatin 80 MG tablet Commonly known as:  LIPITOR Take 1 tablet (80 mg total) by mouth daily.   clopidogrel 75 MG tablet Commonly known as:  PLAVIX Take 1 tablet (75 mg total) by mouth daily.   dextromethorphan-guaiFENesin 30-600 MG 12hr tablet Commonly known as:  MUCINEX DM Take 1 tablet by mouth 2 (two) times daily.   furosemide 40 MG tablet Commonly known as:  LASIX Take 1 tablet (40 mg total) by mouth 2 (two) times daily.   metoprolol succinate 25 MG 24 hr tablet Commonly known as:  TOPROL-XL Take 1 tablet (25 mg total) by mouth daily.   nitroGLYCERIN 0.4 MG SL tablet Commonly known as:  NITROSTAT Place 1 tablet (0.4 mg total) under the tongue every 5 (five) minutes as needed for chest pain (hold for SBP < 110).   potassium chloride 10 MEQ tablet Commonly known as:  K-DUR Take 2 tablets (20 mEq total) by mouth daily.   traMADol 50 MG tablet Commonly known as:  ULTRAM Take 50 mg by mouth every 6 (six) hours as needed for pain. Reported on 02/09/2016   TYLENOL 8 HOUR ARTHRITIS PAIN 650 MG CR tablet Generic drug:  acetaminophen Take 1,300 mg by mouth every 8 (eight) hours as needed for  pain.   valsartan 160 MG tablet Commonly known as:  DIOVAN Take 1 tablet (160 mg total) by mouth daily.       Allergies  Allergen Reactions  . Morphine And Related Hives and Nausea And Vomiting    10 years ago   . Penicillins Hives    Has patient had a PCN reaction causing immediate rash, facial/tongue/throat swelling, SOB or lightheadedness with hypotension: YES Has patient had a PCN reaction causing severe rash involving mucus membranes or skin necrosis: NO Has patient had a PCN reaction that required hospitalization NO Has patient had a PCN reaction occurring within the last 10 years: NO If all of the above answers are "NO", then may proceed with Cephalosporin use.  Marland Kitchen Percocet [Oxycodone-Acetaminophen] Nausea And Vomiting  . Ace Inhibitors Nausea And Vomiting  Other reaction(s): COUGH Other reaction(s): NAUSEA,VOMITING   . Iodinated Diagnostic Agents Nausea And Vomiting    Other reaction(s): VOMITING   . Iodine Itching  . Etodolac     Other reaction(s): NAUSEA,VOMITING  . Vicodin [Hydrocodone-Acetaminophen] Nausea And Vomiting     Procedures /Studies:   Dg Chest 2 View  Result Date: 07/01/2018 CLINICAL DATA:  Cough, shortness of breath EXAM: CHEST - 2 VIEW COMPARISON:  02/20/2018 FINDINGS: Mild cardiomegaly. Lingular atelectasis. Vascular congestion and interstitial prominence could reflect early interstitial edema. No effusions or acute bony abnormality. IMPRESSION: Cardiomegaly with vascular congestion. Increasing interstitial prominence could reflect early interstitial edema. Lingular atelectasis. Electronically Signed   By: Rolm Baptise M.D.   On: 07/01/2018 11:27   Ct Angio Chest Pe W Or Wo Contrast  Result Date: 07/02/2018 CLINICAL DATA:  Intermittent chest pain for 1 week. EXAM: CT ANGIOGRAPHY CHEST WITH CONTRAST TECHNIQUE: Multidetector CT imaging of the chest was performed using the standard protocol during bolus administration of intravenous contrast.  Multiplanar CT image reconstructions and MIPs were obtained to evaluate the vascular anatomy. CONTRAST:  122mL ISOVUE-370 IOPAMIDOL (ISOVUE-370) INJECTION 76% COMPARISON:  02/18/2014 FINDINGS: Cardiovascular: The study is of quality for the evaluation of pulmonary embolism. There are no filling defects in the central, lobar, segmental or subsegmental pulmonary artery branches to suggest acute pulmonary embolism. Common arterial branch of the brachiocephalic and left common carotid arteries. Mild cardiomegaly. No pericardial effusion or thickening. No significant pericardial fluid/thickening. Scattered coronary arteriosclerosis identified along the LAD. Mediastinum/Nodes: No discrete thyroid nodules. Unremarkable esophagus. Mild mediastinal and hilar lymphadenopathy, the largest is right lower paratracheal measuring 1.1 cm short axis. Lungs/Pleura: No pneumothorax. No pleural effusion. Pulmonary consolidation in the lingula consistent with pneumonia and/or atelectasis. Patchy mosaic pattern of the lungs consistent with mild CHF, alveolitis/pneumonitis or hypoventilatory change among some considerations. Upper abdomen: Cyst in the upper pole of the left kidney measuring 1.6 cm. Musculoskeletal:  No aggressive appearing focal osseous lesions. Review of the MIP images confirms the above findings. IMPRESSION: 1. Cardiomegaly with coronary arteriosclerosis. No acute pulmonary embolus. 2. Lingular consolidation suspicious for pneumonia and/or atelectasis. Probable reactive mediastinal and mild hilar adenopathy. 3. Ancillary findings as above. Aortic Atherosclerosis (ICD10-I70.0). Electronically Signed   By: Ashley Royalty M.D.   On: 07/02/2018 02:09    Subjective:   Patient was seen and examined 07/05/2018, 11:01 AM Patient stable today. No acute distress.  No issues overnight Stable for discharge.  Discharge Exam:    Vitals:   07/04/18 1120 07/04/18 2002 07/05/18 0520 07/05/18 0836  BP: (!) 172/59 (!) 163/67 (!)  154/72 (!) 183/86  Pulse: 70 72 70   Resp: 18 19    Temp: 98.7 F (37.1 C) 98.8 F (37.1 C) 99.1 F (37.3 C)   TempSrc: Oral Oral Oral   SpO2: 96% 97% 97%   Weight:   108.9 kg   Height:        General: Pt lying comfortably in bed & appears in no obvious distress. Cardiovascular: S1 & S2 heard, RRR, S1/S2 +. No murmurs, rubs, gallops or clicks. No JVD or pedal edema. Respiratory: Clear to auscultation without wheezing, rhonchi or crackles. No increased work of breathing. Abdominal:  Non-distended, non-tender & soft. No organomegaly or masses appreciated. Normal bowel sounds heard. CNS: Alert and oriented. No focal deficits. Extremities: no edema, no cyanosis    The results of significant diagnostics from this hospitalization (including imaging, microbiology, ancillary and laboratory) are listed below for reference.  Microbiology:   Recent Results (from the past 240 hour(s))  Urine culture     Status: None   Collection Time: 07/01/18 11:46 AM  Result Value Ref Range Status   Specimen Description   Final    URINE, RANDOM Performed at [facility], [address] [place], [place] [address]    Special Requests   Final    NONE Performed at [facility], [address] [place], [place] [address]    Culture   Final    NO GROWTH Performed at [facility], [place] [address], [place], [place] [address]    Report Status 07/02/2018 FINAL  Final     Labs:   CBC: Recent Labs  Lab 07/01/18 1146 07/05/18 0852  WBC 7.4 9.4  NEUTROABS 3.9 4.5  HGB 12.7 12.8  HCT 37.4 39.2  MCV 87.0 89.3  PLT 370 295*   Basic Metabolic Panel: Recent Labs  Lab 07/01/18 1146 07/02/18 0523 07/03/18 0343 07/04/18 0425 07/05/18 0302  NA 140 138 137 139  139 139  K 3.2* 3.6 3.9 3.3*  3.3* 4.4  CL 99 99 98 95*  97* 100  CO2 28 29 26 30  30 27   GLUCOSE 109* 162* 128* 115*  116* 132*  BUN 12 16 32* 18  18 19   CREATININE 1.04* 1.30* 1.83* 1.16*  1.16*  1.11*  CALCIUM 8.6* 8.5* 8.6* 8.7*  8.7* 8.5*  MG  --  2.2  --   --   --   PHOS  --   --   --  3.4  --    Liver Function Tests: Recent Labs  Lab 07/01/18 1146 07/04/18 0425  AST 30  --   ALT 26  --   ALKPHOS 68  --   BILITOT 0.5  --   PROT 8.5*  --   ALBUMIN 3.7 3.3*   BNP (last 3 results) Recent Labs    02/20/18 0630 07/01/18 1146  BNP 87.4 113.7*   Cardiac Enzymes: Recent Labs  Lab 07/01/18 1146  TROPONINI <0.03   Urinalysis    Component Value Date/Time   COLORURINE YELLOW 07/01/2018 1146   APPEARANCEUR CLEAR 07/01/2018 1146   LABSPEC 1.010 07/01/2018 1146   PHURINE 7.0 07/01/2018 1146   GLUCOSEU NEGATIVE 07/01/2018 1146   HGBUR NEGATIVE 07/01/2018 1146   BILIRUBINUR NEGATIVE 07/01/2018 1146   KETONESUR NEGATIVE 07/01/2018 1146   PROTEINUR NEGATIVE 07/01/2018 1146   UROBILINOGEN 0.2 04/14/2015 1451   NITRITE NEGATIVE 07/01/2018 1146   LEUKOCYTESUR NEGATIVE 07/01/2018 1146    Time coordinating discharge: Over 40 minutes  SIGNED: Deatra James, MD, FACP, FHM. [facility],  Pager [phone]  If 7PM-7AM, please contact night-coverage [email or web address], Password [facility] 07/05/2018, 11:01 AM

## 2018-07-05 NOTE — Progress Notes (Signed)
[facility] PROGRESS NOTE  Sherry Fitzpatrick KDT:[id number] DOB: 11/02/1950 DOA: 07/01/2018 PCP: Lilian Coma., MD   Has seen neurologist Dr. Soundra Pilon AR in the outpatient setting for sleep apnea and titration of the same  Narrative: 67 f HTN, HLD, CVA x2-prior TIA 2004, 2011 and last one was in 04/16/2015, reflux, continued tobacco, OSA not using CPAP, right hip osteoarthritis status post surgery Pituitary adenoma status post surgery, dCHF, allegedly negative Myoview 2007 Last admitted to hospitalist service 5/9-02/1318-at the admission hypertensive emergency, chest pain and was found to have AKI and a rhinoviral infection  Admit [facility] 9/17 CP 4-9/10 exaggerated exertion, deep breathing, no unilateral tingling or numbness-blood pressure 230/110-Rx labetalol, Lasix, stated compliance  Work-up revealed BNP elevated 113 UA negative d-dimer 1.9 K3.2 creatinine 1.0 CXR = interstitial edema vascular congestion  Basleine wght per patient ~ 241 Stabilized but developed mild AKI -------------------------------------------------------------------------------------------------------------------------------------------------------- Principal Problem:   Hypertensive urgency Active Problems:   Chest pain   Hyperlipidemia   Essential hypertension   Stroke ([place])   Tobacco abuse   Hypokalemia   Acute on chronic diastolic CHF (congestive heart failure) (HCC)   Positive D dimer   AKI (acute kidney injury) ([place])  Subjective:  She was seen and examined this morning, still having mildly elevated blood pressure some headache.  She was initially discharged home.  Subsequently with ambulation she complained more shortness of breath, continues to have headache, elevated blood pressure.  Quested to be remain in hospital for 1 more day for close observation we have agreed..  Patient has been restarted on her home medication of Norvasc 10 mg p.o. Daily, Chest x-ray and UA negative   A &  Plan   Shortness of breath with ambulation-       -We believe that this is more due to her deconditioning and obesity        PT consulted for evaluation recommendation       History revealing improve meant and congestion, low proBNP, losing weight with diuretics, good diuresis.   htn emergency-ran out clonidine and other meds      -suspect noncompliant at baseline regimen possible        DC'd HCTZ and clonidine,        Blood pressure mildly elevated this morning initiating her home medication of Norvasc at 10 mg   [place], monitoring was replaced  AKI/ckd ii-iii-note bump in RF-received contrast for PE study--adjusting meds--hopefull will plateu  Chest pain-no concern for angina-monitor   diastolic heart failure-diuresis with 40 mg lasix po bid--cut back HCTZ and AMlodipine--kept ARB and Metoprolol--current wght 109 KG=239 pounds--I/o not measured  Pituitary adenoma status post surgery        Allegedly negative Myoview 2007   OSA not using CPAP-follows with neurology-outpatient consideration of the same-last echo 02/22/2018 did not confirm elevated PA peak   Continue tobacco abuse-pre-contemplative  Constipation-sorbitol    DVT prophylaxis: lovenox  Code Status: full   Family Communication:  None here today Disposition Plan: ip    Verlon Au, MD  [facility] Direct contact: [phone] --Via amion app OR  --[email or web address]; password [email or web address]  7PM-7AM contact night coverage as above 07/05/2018, 1:31 PM  LOS: 2 days   Consultants:  no  Procedures: ECHO - Left ventricle: The cavity size was normal. Wall thickness was  increased in a pattern of mild LVH. Systolic function was  vigorous. The estimated ejection fraction was in the range of 65%  to 70%. Wall motion was normal; there were no regional  wall  motion abnormalities. Features are consistent with a pseudonormal  left ventricular filling pattern, with concomitant abnormal  relaxation and increased filling pressure  (grade 2 diastolic  dysfunction).  CT chest and Duplex LE neg for DVT  Antimicrobials:  no Objective:  Exam: BP (!) 142/72   Pulse 70   Temp 99.1 F (37.3 C) (Oral)   Resp 19   Ht 5\' 4"  (1.626 m)   Wt 108.9 kg   SpO2 98%   BMI 41.21 kg/m    Physical Exam  Constitution:  Alert, cooperative, no distress,  Psychiatric: Normal and stable mood and affect, cognition intact,   HEENT: Normocephalic, PERRL, otherwise with in Normal limits  Chest:Chest symmetric Cardio vascular:  S1/S2, RRR, No murmure, No Rubs or Gallops  pulmonary: Clear to auscultation bilaterally, respirations unlabored, negative wheezes / crackles Abdomen: Soft, non-tender, non-distended, bowel sounds,no masses, no organomegaly Muscular skeletal: Limited exam - in bed, able to move all 4 extremities, Normal strength,  Neuro: CNII-XII intact. , normal motor and sensation, reflexes intact  Extremities: No pitting edema lower extremities, +2 pulses, bilateral upper thigh tenderness Skin: Dry, warm to touch, negative for any Rashes, No open wounds Wounds: per nursing documentation   I have personally reviewed the following:   Labs:  BUN/creatinine 16/1.3--->32/1.83--->18/1.116  LDL 120 HDL 30  Scheduled Meds: . [START ON 07/06/2018] amLODipine  10 mg Oral Daily  . aspirin EC  81 mg Oral Daily  . atorvastatin  80 mg Oral Daily  . clopidogrel  75 mg Oral Daily  . dextromethorphan-guaiFENesin  1 tablet Oral BID  . enoxaparin (LOVENOX) injection  40 mg Subcutaneous Q24H  . furosemide  20 mg Intravenous Once  . furosemide  40 mg Oral BID  . irbesartan  150 mg Oral Daily  . loratadine  10 mg Oral Daily  . mouth rinse  15 mL Mouth Rinse BID  . metoprolol succinate  25 mg Oral Daily  . nicotine  21 mg Transdermal Daily  . sodium chloride flush  3 mL Intravenous Q12H  . sorbitol  30 mL Oral Daily   Continuous Infusions: . sodium chloride      LOS: 2 days

## 2018-07-06 ENCOUNTER — Encounter (HOSPITAL_COMMUNITY): Payer: Self-pay

## 2018-07-06 LAB — BASIC METABOLIC PANEL
Anion gap: 9 (ref 5–15)
BUN: 19 mg/dL (ref 8–23)
CO2: 30 mmol/L (ref 22–32)
Calcium: 8.6 mg/dL — ABNORMAL LOW (ref 8.9–10.3)
Chloride: 101 mmol/L (ref 98–111)
Creatinine, Ser: 1.18 mg/dL — ABNORMAL HIGH (ref 0.44–1.00)
GFR calc Af Amer: 53 mL/min — ABNORMAL LOW (ref >60)
GFR calc non Af Amer: 46 mL/min — ABNORMAL LOW (ref >60)
Glucose, Bld: 104 mg/dL — ABNORMAL HIGH (ref 70–99)
POTASSIUM: 3.4 mmol/L — AB (ref 3.5–5.1)
SODIUM: 140 mmol/L (ref 135–145)

## 2018-07-06 MED ORDER — POTASSIUM CHLORIDE CRYS ER 20 MEQ PO TBCR
20.0000 meq | EXTENDED_RELEASE_TABLET | Freq: Every day | ORAL | Status: DC
  Administered 2018-07-06: 20 meq via ORAL
  Filled 2018-07-06: qty 1

## 2018-07-06 NOTE — Evaluation (Signed)
Physical Therapy Evaluation Patient Details Name: Sherry Fitzpatrick MRN: [id number] DOB: 04-30-1951 Today's Date: 07/06/2018   History of Present Illness  Sherry Fitzpatrick is a 47 f HTN, HLD, CVA x2-prior TIA 2004, 2011 and last one was in 04/16/2015, reflux, continued tobacco, OSA not using CPAP, right hip osteoarthritis status post surgery     Clinical Impression  Patient evaluated by Physical Therapy with no further acute PT needs identified. All education has been completed and the patient has no further questions. PT ambulating independently however with DOE 3/4, quickly over 25', able to walk 75-100' before requiring seated rest break. SpO2 WNL on RA throughout session, HRmax 89.  See below for any follow-up Physical Therapy or equipment needs. PT is signing off. Thank you for this referral.      Follow Up Recommendations Outpatient PT    Equipment Recommendations  None recommended by PT    Recommendations for Other Services       Precautions / Restrictions Precautions Precautions: None Restrictions Weight Bearing Restrictions: No      Mobility  Bed Mobility Overal bed mobility: Independent                Transfers Overall transfer level: Independent                  Ambulation/Gait Ambulation/Gait assistance: Supervision Gait Distance (Feet): 200 Feet(100' 100') Assistive device: None Gait Pattern/deviations: WFL(Within Functional Limits) Gait velocity: decreased   General Gait Details: pt becoming DOE 3/4 quickly with ambulating, safe but needs frequent rest breaks   Stairs            Wheelchair Mobility    Modified Rankin (Stroke Patients Only)       Balance                                             Pertinent Vitals/Pain Pain Assessment: No/denies pain    Home Living Family/patient expects to be discharged to:: Private residence Living Arrangements: Other relatives Available Help at Discharge:  Family;Available PRN/intermittently Type of Home: House Home Access: Stairs to enter     Home Layout: One level Home Equipment: None      Prior Function Level of Independence: Independent               Hand Dominance        Extremity/Trunk Assessment   Upper Extremity Assessment Upper Extremity Assessment: Overall WFL for tasks assessed    Lower Extremity Assessment Lower Extremity Assessment: Overall WFL for tasks assessed       Communication   Communication: No difficulties  Cognition Arousal/Alertness: Awake/alert                                            General Comments      Exercises     Assessment/Plan    PT Assessment All further PT needs can be met in the next venue of care  PT Problem List Cardiopulmonary status limiting activity       PT Treatment Interventions      PT Goals (Current goals can be found in the Care Plan section)  Acute Rehab PT Goals Patient Stated Goal: return home  PT Goal Formulation: With patient    Frequency  Barriers to discharge        Co-evaluation               AM-PAC PT "6 Clicks" Daily Activity  Outcome Measure Difficulty turning over in bed (including adjusting bedclothes, sheets and blankets)?: None Difficulty moving from lying on back to sitting on the side of the bed? : None Difficulty sitting down on and standing up from a chair with arms (e.g., wheelchair, bedside commode, etc,.)?: None Help needed moving to and from a bed to chair (including a wheelchair)?: A Little Help needed walking in hospital room?: A Little Help needed climbing 3-5 steps with a railing? : A Little 6 Click Score: 21    End of Session Equipment Utilized During Treatment: Gait belt Activity Tolerance: Patient tolerated treatment well Patient left: in bed;with call bell/phone within reach Nurse Communication: Mobility status PT Visit Diagnosis: Unsteadiness on feet (R26.81);Difficulty in  walking, not elsewhere classified (R26.2)    Time: 3552-1747 PT Time Calculation (min) (ACUTE ONLY): 25 min   Charges:   PT Evaluation $PT Eval Low Complexity: 1 Low PT Treatments $Gait Training: 8-22 mins        Reinaldo Berber, PT, DPT Acute Rehabilitation Services Pager: [phone] Office: [phone]    Reinaldo Berber 07/06/2018, 11:05 AM

## 2018-07-06 NOTE — Discharge Summary (Signed)
5         Physician Discharge Summary [facility]    Patient: Sherry Fitzpatrick                   Admit date: 07/01/2018   DOB: 1951/05/29             Discharge date:07/06/2018/9:11 AM [id number]:[id number]                           PCP: Lilian Coma., MD  Recommendations for Outpatient Follow-up:   . Follow up: PCP, cardiology, heart failure clinic  Discharge Condition: Stable   Code Status:   Code Status: Full Code  Diet recommendation: Cardiac diet   Table to days, would like to go Home! No changes... Stable to be D/C home.   Discharge Diagnoses:    Principal Problem:   Hypertensive urgency Active Problems:   Chest pain   Hyperlipidemia   Essential hypertension   Stroke ([place])   Tobacco abuse   Hypokalemia   Acute on chronic diastolic CHF (congestive heart failure) (HCC)   Positive D dimer   AKI (acute kidney injury) ([place])   History of Present Illness/ Hospital Course Sherry Fitzpatrick Summary:    Sherry Fitzpatrick is a 57 f HTN, HLD, CVA x2-prior TIA 2004, 2011 and last one was in 04/16/2015, reflux, continued tobacco, OSA not using CPAP, right hip osteoarthritis status post surgery Pituitary adenoma status post surgery, dCHF, allegedly negative Myoview 2007 Last admitted to hospitalist service 5/9-02/1318-at the admission hypertensive emergency, chest pain and was found to have AKI and a rhinoviral infection  Admit [facility] 9/17 CP 4-9/10 exaggerated exertion, deep breathing, no unilateral tingling or numbness-blood pressure 230/110-Rx labetalol, Lasix, stated compliance  Work-up revealed BNP elevated 113 UA negative d-dimer 1.9 K3.2 creatinine 1.0 CXR = interstitial edema vascular congestion  Patient was diuresed aggressively now on Lasix 40 mg p.o. twice daily, continue home medication of amlodipine, oral, metoprolol, blood pressure has improved, HCTZ DC'd  Developed some cramping with mild hypokalemia, additives supplement potassium was added, hemoglobin daily.  Basleine  wght per patient ~ 241 (on admission weight 244 lbs --> today 240 lbs) Stabilized but developed mild AKI  She is cleared for discharge home today  Detailed hospital course please see below:   --------------------------------------------------------------------------------------------------------------------------------------------------------  Labs were reviewed potassium was mildly low, blood pressure was elevated this morning.   htn emergency-ran out clonidine and other meds-suspect noncompliant at baseline regimen possible  At home--Cut back meds as below--DO NOT give clonidine on d/c       Blood pressure mildly elevated this morning, likely due to stress, lower leg pain, monitor closely Hypokalemia -     Place potassium, will ambulate h       AKI/ckd ii-iii-note bump in RF-received contrast for PE study--adjusting meds--hopefull will plateu  Chest pain-no concern for angina-monitor   diastolic heart failure-diuresis with 40 mg lasix po bid-- D/C HCTZ, cont  Amlodipine,  ARB and Metoprolol-  Pituitary adenoma - status post surgery        Allegedly negative Myoview 2007   OSA not using CPAP-follows with neurology-outpatient consideration of the same-last echo 02/22/2018 did not confirm elevated PA peak   Continue tobacco abuse-pre-contemplative  Constipation-sorbitol  Procedures: No admission procedures for hospital encounter.   Discharge Instructions:   Discharge Instructions    (HEART FAILURE PATIENTS) Call MD:  Anytime you have any of the following symptoms: 1) 3 pound weight  gain in 24 hours or 5 pounds in 1 week 2) shortness of breath, with or without a dry hacking cough 3) swelling in the hands, feet or stomach 4) if you have to sleep on extra pillows at night in order to breathe.   Complete by:  As directed    AMB referral to CHF clinic   Complete by:  As directed    Activity as tolerated - No restrictions   Complete by:  As directed    Activity as  tolerated - No restrictions   Complete by:  As directed    Diet - low sodium heart healthy   Complete by:  As directed    Diet - low sodium heart healthy   Complete by:  As directed    Diet - low sodium heart healthy   Complete by:  As directed    Discharge instructions   Complete by:  As directed    Check your blood pressure daily, any excessive cramping may take additional tablets of KCl Avoid caffeine, high-dose salt diet   Discharge instructions   Complete by:  As directed    Increase activity slowly   Complete by:  As directed    Increase activity slowly   Complete by:  As directed    Increase activity slowly   Complete by:  As directed        Medication List    STOP taking these medications   cloNIDine 0.3 MG tablet Commonly known as:  CATAPRES   hydrochlorothiazide 25 MG tablet Commonly known as:  HYDRODIURIL   loratadine 10 MG tablet Commonly known as:  CLARITIN   predniSONE 20 MG tablet Commonly known as:  DELTASONE     TAKE these medications   albuterol 108 (90 Base) MCG/ACT inhaler Commonly known as:  PROVENTIL HFA;VENTOLIN HFA Inhale 2 puffs into the lungs every 6 (six) hours as needed for wheezing.   amLODipine 10 MG tablet Commonly known as:  NORVASC Take 1 tablet (10 mg total) by mouth daily.   atorvastatin 80 MG tablet Commonly known as:  LIPITOR Take 1 tablet (80 mg total) by mouth daily.   clopidogrel 75 MG tablet Commonly known as:  PLAVIX Take 1 tablet (75 mg total) by mouth daily.   dextromethorphan-guaiFENesin 30-600 MG 12hr tablet Commonly known as:  MUCINEX DM Take 1 tablet by mouth 2 (two) times daily.   furosemide 40 MG tablet Commonly known as:  LASIX Take 1 tablet (40 mg total) by mouth 2 (two) times daily.   metoprolol succinate 25 MG 24 hr tablet Commonly known as:  TOPROL-XL Take 1 tablet (25 mg total) by mouth daily.   nitroGLYCERIN 0.4 MG SL tablet Commonly known as:  NITROSTAT Place 1 tablet (0.4 mg total) under  the tongue every 5 (five) minutes as needed for chest pain (hold for SBP < 110).   potassium chloride 10 MEQ tablet Commonly known as:  K-DUR Take 2 tablets (20 mEq total) by mouth daily.   traMADol 50 MG tablet Commonly known as:  ULTRAM Take 50 mg by mouth every 6 (six) hours as needed for pain. Reported on 02/09/2016   TYLENOL 8 HOUR ARTHRITIS PAIN 650 MG CR tablet Generic drug:  acetaminophen Take 1,300 mg by mouth every 8 (eight) hours as needed for pain.   valsartan 160 MG tablet Commonly known as:  DIOVAN Take 1 tablet (160 mg total) by mouth daily.       Allergies  Allergen Reactions  . Morphine  And Related Hives and Nausea And Vomiting    10 years ago   . Penicillins Hives    Has patient had a PCN reaction causing immediate rash, facial/tongue/throat swelling, SOB or lightheadedness with hypotension: YES Has patient had a PCN reaction causing severe rash involving mucus membranes or skin necrosis: NO Has patient had a PCN reaction that required hospitalization NO Has patient had a PCN reaction occurring within the last 10 years: NO If all of the above answers are "NO", then may proceed with Cephalosporin use.  Marland Kitchen Percocet [Oxycodone-Acetaminophen] Nausea And Vomiting  . Ace Inhibitors Nausea And Vomiting    Other reaction(s): COUGH Other reaction(s): NAUSEA,VOMITING   . Iodinated Diagnostic Agents Nausea And Vomiting    Other reaction(s): VOMITING   . Iodine Itching  . Etodolac     Other reaction(s): NAUSEA,VOMITING  . Vicodin [Hydrocodone-Acetaminophen] Nausea And Vomiting     Procedures /Studies:   Dg Chest 2 View  Result Date: 07/05/2018 CLINICAL DATA:  Shortness of breath. EXAM: CHEST - 2 VIEW COMPARISON:  Chest x-ray dated 07/01/2018 and 02/20/2018 and chest CT dated 07/02/2018 FINDINGS: Borderline cardiomegaly. Pulmonary vascularity is normal. Focal area of atelectasis/infiltrate in the left upper lobe is less prominent. Slight peribronchial thickening  on the right. No effusions. No significant bone abnormality. IMPRESSION: 1. Improving small area of atelectasis/infiltrate in the left upper lobe. 2. Slight bronchitic changes. 3. Borderline cardiomegaly. Electronically Signed   By: [name] M.D.   On: 07/05/2018 12:05   Dg Chest 2 View  Result Date: 07/01/2018 CLINICAL DATA:  Cough, shortness of breath EXAM: CHEST - 2 VIEW COMPARISON:  02/20/2018 FINDINGS: Mild cardiomegaly. Lingular atelectasis. Vascular congestion and interstitial prominence could reflect early interstitial edema. No effusions or acute bony abnormality. IMPRESSION: Cardiomegaly with vascular congestion. Increasing interstitial prominence could reflect early interstitial edema. Lingular atelectasis. Electronically Signed   By: Rolm Baptise M.D.   On: 07/01/2018 11:27   Ct Angio Chest Pe W Or Wo Contrast  Result Date: 07/02/2018 CLINICAL DATA:  Intermittent chest pain for 1 week. EXAM: CT ANGIOGRAPHY CHEST WITH CONTRAST TECHNIQUE: Multidetector CT imaging of the chest was performed using the standard protocol during bolus administration of intravenous contrast. Multiplanar CT image reconstructions and MIPs were obtained to evaluate the vascular anatomy. CONTRAST:  167mL ISOVUE-370 IOPAMIDOL (ISOVUE-370) INJECTION 76% COMPARISON:  02/18/2014 FINDINGS: Cardiovascular: The study is of quality for the evaluation of pulmonary embolism. There are no filling defects in the central, lobar, segmental or subsegmental pulmonary artery branches to suggest acute pulmonary embolism. Common arterial branch of the brachiocephalic and left common carotid arteries. Mild cardiomegaly. No pericardial effusion or thickening. No significant pericardial fluid/thickening. Scattered coronary arteriosclerosis identified along the LAD. Mediastinum/Nodes: No discrete thyroid nodules. Unremarkable esophagus. Mild mediastinal and hilar lymphadenopathy, the largest is right lower paratracheal measuring 1.1 cm short  axis. Lungs/Pleura: No pneumothorax. No pleural effusion. Pulmonary consolidation in the lingula consistent with pneumonia and/or atelectasis. Patchy mosaic pattern of the lungs consistent with mild CHF, alveolitis/pneumonitis or hypoventilatory change among some considerations. Upper abdomen: Cyst in the upper pole of the left kidney measuring 1.6 cm. Musculoskeletal:  No aggressive appearing focal osseous lesions. Review of the MIP images confirms the above findings. IMPRESSION: 1. Cardiomegaly with coronary arteriosclerosis. No acute pulmonary embolus. 2. Lingular consolidation suspicious for pneumonia and/or atelectasis. Probable reactive mediastinal and mild hilar adenopathy. 3. Ancillary findings as above. Aortic Atherosclerosis (ICD10-I70.0). Electronically Signed   By: Ashley Royalty M.D.   On: 07/02/2018 02:09  Subjective:   Patient was seen and examined 07/06/2018, 9:11 AM Patient stable today. No acute distress.  No issues overnight Stable for discharge.  Discharge Exam:    Vitals:   07/05/18 1200 07/05/18 1300 07/05/18 1915 07/06/18 0423  BP: (!) 162/72  (!) 169/81 (!) 165/83  Pulse: 71  77 95  Resp:    19  Temp:   99 F (37.2 C) 98.7 F (37.1 C)  TempSrc:   Oral Oral  SpO2: 93% 95% 97% 97%  Weight:    108.5 kg  Height:       BP (!) 165/83 (BP Location: Right Arm)   Pulse 95   Temp 98.7 F (37.1 C) (Oral)   Resp 19   Ht 5\' 4"  (1.626 m)   Wt 108.5 kg   SpO2 97%   BMI 41.04 kg/m    Physical Exam  Constitution:  Alert, cooperative, no distress,  Psychiatric: Normal and stable mood and affect, cognition intact,   HEENT: Normocephalic, PERRL, otherwise with in Normal limits  Chest:Chest symmetric Cardio vascular:  S1/S2, RRR, No murmure, No Rubs or Gallops  pulmonary: Clear to auscultation bilaterally, respirations unlabored, negative wheezes / crackles Abdomen: Soft, non-tender, non-distended, bowel sounds,no masses, no organomegaly Muscular skeletal: Limited exam -  in bed, able to move all 4 extremities, Normal strength,  Neuro: CNII-XII intact. , normal motor and sensation, reflexes intact  Extremities: +1 edema lower extremities, +2 pulses  Skin: Dry, warm to touch, negative for any Rashes, No open wounds Wounds: per nursing documentation   The results of significant diagnostics from this hospitalization (including imaging, microbiology, ancillary and laboratory) are listed below for reference.      Microbiology:   Recent Results (from the past 240 hour(s))  Urine culture     Status: None   Collection Time: 07/01/18 11:46 AM  Result Value Ref Range Status   Specimen Description   Final    URINE, RANDOM Performed at [facility], [address] [place], [place] [address]    Special Requests   Final    NONE Performed at [facility], [address] [place], [place] [address]    Culture   Final    NO GROWTH Performed at [facility], [place] [address], [place], [place] [address]    Report Status 07/02/2018 FINAL  Final     Labs:   CBC: Recent Labs  Lab 07/01/18 1146 07/05/18 0852  WBC 7.4 9.4  NEUTROABS 3.9 4.5  HGB 12.7 12.8  HCT 37.4 39.2  MCV 87.0 89.3  PLT 370 537*   Basic Metabolic Panel: Recent Labs  Lab 07/02/18 0523 07/03/18 0343 07/04/18 0425 07/05/18 0302 07/06/18 0407  NA 138 137 139  139 139 140  K 3.6 3.9 3.3*  3.3* 4.4 3.4*  CL 99 98 95*  97* 100 101  CO2 29 26 30  30 27 30   GLUCOSE 162* 128* 115*  116* 132* 104*  BUN 16 32* 18  18 19 19   CREATININE 1.30* 1.83* 1.16*  1.16* 1.11* 1.18*  CALCIUM 8.5* 8.6* 8.7*  8.7* 8.5* 8.6*  MG 2.2  --   --   --   --   PHOS  --   --  3.4  --   --    Liver Function Tests: Recent Labs  Lab 07/01/18 1146 07/04/18 0425  AST 30  --   ALT 26  --   ALKPHOS 68  --  BILITOT 0.5  --   PROT 8.5*  --   ALBUMIN 3.7 3.3*   BNP (last 3 results) Recent Labs    02/20/18 0630 07/01/18 1146  BNP 87.4 113.7*   Cardiac  Enzymes: Recent Labs  Lab 07/01/18 1146  TROPONINI <0.03   Urinalysis    Component Value Date/Time   COLORURINE YELLOW 07/05/2018 [place] 07/05/2018 1241   LABSPEC 1.012 07/05/2018 1241   PHURINE 8.0 07/05/2018 1241   GLUCOSEU NEGATIVE 07/05/2018 1241   HGBUR NEGATIVE 07/05/2018 1241   BILIRUBINUR NEGATIVE 07/05/2018 1241   KETONESUR NEGATIVE 07/05/2018 1241   PROTEINUR NEGATIVE 07/05/2018 1241   UROBILINOGEN 0.2 04/14/2015 1451   NITRITE NEGATIVE 07/05/2018 1241   LEUKOCYTESUR NEGATIVE 07/05/2018 1241    Time coordinating discharge: Over 40 minutes  SIGNED: Deatra James, MD, FACP, FHM. [facility],  Pager [phone]  If 7PM-7AM, please contact night-coverage [email or web address] 07/06/2018, 9:11 AM

## 2018-07-06 NOTE — Progress Notes (Signed)
Orders received to discharge patient.  Telemetry monitor removed and CCMD notified.  PIV access removed.  Discharge instructions, follow up, medications and instructions for their use discussed with patient. 

## 2018-07-06 NOTE — Care Management Note (Signed)
Case Management Note  Patient Details  Name: Sherry Fitzpatrick MRN: [id number] Date of Birth: Nov 04, 1950  Subjective/Objective:                 CHF   Action/Plan:  Spoke w patient at the bedside. She states that she plans on staying with her daughter for the next two weeks. She states that she had trouble getting refills but now plans to follow with Dr Tammi Klippel off [address] in [place]. This is her daughter's doctor and she feels confident with this plan. She states that she has no difficulties ambulating, weighs herself, "does everything (she) is suppose to do" and has no other barriers to getting her meds. No CM needs at this time.   Expected Discharge Date:  07/06/18               Expected Discharge Plan:     In-House Referral:     Discharge planning Services  CM Consult  Post Acute Care Choice:    Choice offered to:     DME Arranged:    DME Agency:     HH Arranged:    HH Agency:     Status of Service:  Completed, signed off  If discussed at [employer] of Stay Meetings, dates discussed:    Additional Comments:  Carles Collet, RN 07/06/2018, 9:58 AM

## 2018-07-11 DIAGNOSIS — I110 Hypertensive heart disease with heart failure: Secondary | ICD-10-CM | POA: Diagnosis not present

## 2018-07-11 DIAGNOSIS — I5033 Acute on chronic diastolic (congestive) heart failure: Secondary | ICD-10-CM | POA: Diagnosis not present

## 2018-07-11 DIAGNOSIS — R7989 Other specified abnormal findings of blood chemistry: Secondary | ICD-10-CM | POA: Diagnosis not present

## 2018-07-11 DIAGNOSIS — G4733 Obstructive sleep apnea (adult) (pediatric): Secondary | ICD-10-CM | POA: Diagnosis not present

## 2018-07-11 DIAGNOSIS — Z9114 Patient's other noncompliance with medication regimen: Secondary | ICD-10-CM | POA: Diagnosis not present

## 2018-07-11 DIAGNOSIS — N179 Acute kidney failure, unspecified: Secondary | ICD-10-CM | POA: Diagnosis not present

## 2018-07-11 DIAGNOSIS — Z720 Tobacco use: Secondary | ICD-10-CM | POA: Diagnosis not present

## 2018-09-15 DIAGNOSIS — M25562 Pain in left knee: Secondary | ICD-10-CM | POA: Diagnosis not present

## 2018-09-15 DIAGNOSIS — M1612 Unilateral primary osteoarthritis, left hip: Secondary | ICD-10-CM | POA: Diagnosis not present

## 2018-09-15 DIAGNOSIS — I10 Essential (primary) hypertension: Secondary | ICD-10-CM | POA: Diagnosis not present

## 2018-09-15 DIAGNOSIS — M1712 Unilateral primary osteoarthritis, left knee: Secondary | ICD-10-CM | POA: Diagnosis not present

## 2018-09-15 DIAGNOSIS — M25552 Pain in left hip: Secondary | ICD-10-CM | POA: Diagnosis not present

## 2019-05-05 ENCOUNTER — Inpatient Hospital Stay (HOSPITAL_COMMUNITY): Payer: Medicare HMO

## 2019-05-05 ENCOUNTER — Inpatient Hospital Stay (HOSPITAL_COMMUNITY)
Admission: EM | Admit: 2019-05-05 | Discharge: 2019-05-07 | DRG: 305 | Disposition: A | Discharge status: Home Health | Payer: Medicare HMO | Attending: Internal Medicine | Admitting: Internal Medicine

## 2019-05-05 ENCOUNTER — Emergency Department (HOSPITAL_COMMUNITY): Payer: Medicare HMO

## 2019-05-05 ENCOUNTER — Encounter (HOSPITAL_COMMUNITY): Payer: Self-pay | Admitting: Emergency Medicine

## 2019-05-05 DIAGNOSIS — Z91041 Radiographic dye allergy status: Secondary | ICD-10-CM | POA: Diagnosis not present

## 2019-05-05 DIAGNOSIS — G8191 Hemiplegia, unspecified affecting right dominant side: Secondary | ICD-10-CM | POA: Diagnosis present

## 2019-05-05 DIAGNOSIS — Z1159 Encounter for screening for other viral diseases: Secondary | ICD-10-CM

## 2019-05-05 DIAGNOSIS — R531 Weakness: Secondary | ICD-10-CM

## 2019-05-05 DIAGNOSIS — R079 Chest pain, unspecified: Secondary | ICD-10-CM | POA: Diagnosis not present

## 2019-05-05 DIAGNOSIS — Z885 Allergy status to narcotic agent status: Secondary | ICD-10-CM | POA: Diagnosis not present

## 2019-05-05 DIAGNOSIS — R29702 NIHSS score 2: Secondary | ICD-10-CM | POA: Diagnosis present

## 2019-05-05 DIAGNOSIS — Z79899 Other long term (current) drug therapy: Secondary | ICD-10-CM

## 2019-05-05 DIAGNOSIS — I7389 Other specified peripheral vascular diseases: Secondary | ICD-10-CM | POA: Diagnosis present

## 2019-05-05 DIAGNOSIS — Z91048 Other nonmedicinal substance allergy status: Secondary | ICD-10-CM

## 2019-05-05 DIAGNOSIS — E785 Hyperlipidemia, unspecified: Secondary | ICD-10-CM | POA: Diagnosis present

## 2019-05-05 DIAGNOSIS — G4733 Obstructive sleep apnea (adult) (pediatric): Secondary | ICD-10-CM | POA: Diagnosis present

## 2019-05-05 DIAGNOSIS — F17210 Nicotine dependence, cigarettes, uncomplicated: Secondary | ICD-10-CM | POA: Diagnosis present

## 2019-05-05 DIAGNOSIS — I371 Nonrheumatic pulmonary valve insufficiency: Secondary | ICD-10-CM | POA: Diagnosis not present

## 2019-05-05 DIAGNOSIS — I639 Cerebral infarction, unspecified: Secondary | ICD-10-CM | POA: Diagnosis present

## 2019-05-05 DIAGNOSIS — Z8249 Family history of ischemic heart disease and other diseases of the circulatory system: Secondary | ICD-10-CM

## 2019-05-05 DIAGNOSIS — Z6841 Body Mass Index (BMI) 40.0 and over, adult: Secondary | ICD-10-CM | POA: Diagnosis not present

## 2019-05-05 DIAGNOSIS — Z880 Allergy status to penicillin: Secondary | ICD-10-CM

## 2019-05-05 DIAGNOSIS — I5032 Chronic diastolic (congestive) heart failure: Secondary | ICD-10-CM | POA: Diagnosis not present

## 2019-05-05 DIAGNOSIS — Z8673 Personal history of transient ischemic attack (TIA), and cerebral infarction without residual deficits: Secondary | ICD-10-CM | POA: Diagnosis not present

## 2019-05-05 DIAGNOSIS — I340 Nonrheumatic mitral (valve) insufficiency: Secondary | ICD-10-CM | POA: Diagnosis not present

## 2019-05-05 DIAGNOSIS — Z96641 Presence of right artificial hip joint: Secondary | ICD-10-CM | POA: Diagnosis present

## 2019-05-05 DIAGNOSIS — R29810 Facial weakness: Secondary | ICD-10-CM | POA: Diagnosis present

## 2019-05-05 DIAGNOSIS — R29818 Other symptoms and signs involving the nervous system: Secondary | ICD-10-CM | POA: Diagnosis not present

## 2019-05-05 DIAGNOSIS — E876 Hypokalemia: Secondary | ICD-10-CM | POA: Diagnosis present

## 2019-05-05 DIAGNOSIS — Z888 Allergy status to other drugs, medicaments and biological substances status: Secondary | ICD-10-CM

## 2019-05-05 DIAGNOSIS — Z20828 Contact with and (suspected) exposure to other viral communicable diseases: Secondary | ICD-10-CM | POA: Diagnosis not present

## 2019-05-05 DIAGNOSIS — I10 Essential (primary) hypertension: Secondary | ICD-10-CM | POA: Diagnosis present

## 2019-05-05 DIAGNOSIS — N183 Chronic kidney disease, stage 3 (moderate): Secondary | ICD-10-CM | POA: Diagnosis present

## 2019-05-05 DIAGNOSIS — I5030 Unspecified diastolic (congestive) heart failure: Secondary | ICD-10-CM | POA: Diagnosis present

## 2019-05-05 DIAGNOSIS — I130 Hypertensive heart and chronic kidney disease with heart failure and stage 1 through stage 4 chronic kidney disease, or unspecified chronic kidney disease: Secondary | ICD-10-CM | POA: Diagnosis not present

## 2019-05-05 DIAGNOSIS — K219 Gastro-esophageal reflux disease without esophagitis: Secondary | ICD-10-CM | POA: Diagnosis present

## 2019-05-05 DIAGNOSIS — Z7902 Long term (current) use of antithrombotics/antiplatelets: Secondary | ICD-10-CM

## 2019-05-05 DIAGNOSIS — I161 Hypertensive emergency: Secondary | ICD-10-CM | POA: Diagnosis not present

## 2019-05-05 LAB — I-STAT CHEM 8, ED
BUN: 21 mg/dL (ref 8–23)
Calcium, Ion: 1.08 mmol/L — ABNORMAL LOW (ref 1.15–1.40)
Chloride: 102 mmol/L (ref 98–111)
Creatinine, Ser: 1.2 mg/dL — ABNORMAL HIGH (ref 0.44–1.00)
Glucose, Bld: 113 mg/dL — ABNORMAL HIGH (ref 70–99)
HCT: 38 % (ref 36.0–46.0)
Hemoglobin: 12.9 g/dL (ref 12.0–15.0)
Potassium: 3.1 mmol/L — ABNORMAL LOW (ref 3.5–5.1)
Sodium: 141 mmol/L (ref 135–145)
TCO2: 26 mmol/L (ref 22–32)

## 2019-05-05 LAB — DIFFERENTIAL
Abs Immature Granulocytes: 0.02 10*3/uL (ref 0.00–0.07)
Basophils Absolute: 0.1 10*3/uL (ref 0.0–0.1)
Basophils Relative: 1 %
Eosinophils Absolute: 0.4 10*3/uL (ref 0.0–0.5)
Eosinophils Relative: 5 %
Immature Granulocytes: 0 %
Lymphocytes Relative: 35 %
Lymphs Abs: 2.8 10*3/uL (ref 0.7–4.0)
Monocytes Absolute: 0.9 10*3/uL (ref 0.1–1.0)
Monocytes Relative: 11 %
Neutro Abs: 3.9 10*3/uL (ref 1.7–7.7)
Neutrophils Relative %: 48 %

## 2019-05-05 LAB — CBC
HCT: 38.4 % (ref 36.0–46.0)
Hemoglobin: 12.6 g/dL (ref 12.0–15.0)
MCH: 28.8 pg (ref 26.0–34.0)
MCHC: 32.8 g/dL (ref 30.0–36.0)
MCV: 87.9 fL (ref 80.0–100.0)
Platelets: 301 10*3/uL (ref 150–400)
RBC: 4.37 MIL/uL (ref 3.87–5.11)
RDW: 15.6 % — ABNORMAL HIGH (ref 11.5–15.5)
WBC: 7.9 10*3/uL (ref 4.0–10.5)
nRBC: 0 % (ref 0.0–0.2)

## 2019-05-05 LAB — CBG MONITORING, ED: Glucose-Capillary: 106 mg/dL — ABNORMAL HIGH (ref 70–99)

## 2019-05-05 LAB — COMPREHENSIVE METABOLIC PANEL
ALT: 16 U/L (ref 0–44)
AST: 19 U/L (ref 15–41)
Albumin: 3.4 g/dL — ABNORMAL LOW (ref 3.5–5.0)
Alkaline Phosphatase: 65 U/L (ref 38–126)
Anion gap: 11 (ref 5–15)
BUN: 19 mg/dL (ref 8–23)
CO2: 25 mmol/L (ref 22–32)
Calcium: 8.9 mg/dL (ref 8.9–10.3)
Chloride: 103 mmol/L (ref 98–111)
Creatinine, Ser: 1.21 mg/dL — ABNORMAL HIGH (ref 0.44–1.00)
GFR calc Af Amer: 53 mL/min — ABNORMAL LOW (ref >60)
GFR calc non Af Amer: 46 mL/min — ABNORMAL LOW (ref >60)
Glucose, Bld: 118 mg/dL — ABNORMAL HIGH (ref 70–99)
Potassium: 3 mmol/L — ABNORMAL LOW (ref 3.5–5.1)
Sodium: 139 mmol/L (ref 135–145)
Total Bilirubin: 0.2 mg/dL — ABNORMAL LOW (ref 0.3–1.2)
Total Protein: 7.3 g/dL (ref 6.5–8.1)

## 2019-05-05 LAB — SARS CORONAVIRUS 2 BY RT PCR (HOSPITAL ORDER, PERFORMED IN ~~LOC~~ HOSPITAL LAB): SARS Coronavirus 2: NEGATIVE

## 2019-05-05 LAB — PROTIME-INR
INR: 1 (ref 0.8–1.2)
Prothrombin Time: 13.2 seconds (ref 11.4–15.2)

## 2019-05-05 LAB — APTT: aPTT: 30 seconds (ref 24–36)

## 2019-05-05 MED ORDER — CLONIDINE HCL 0.2 MG PO TABS: 0.3000 mg | ORAL_TABLET | Freq: Two times a day (BID) | ORAL | Status: DC

## 2019-05-05 MED ORDER — ACETAMINOPHEN 650 MG RE SUPP: 650.0000 mg | RECTAL | Status: DC | PRN

## 2019-05-05 MED ORDER — POTASSIUM CHLORIDE ER 10 MEQ PO TBCR
10.0000 meq | EXTENDED_RELEASE_TABLET | Freq: Every day | ORAL | Status: DC
  Administered 2019-05-06 – 2019-05-07 (×2): 10 meq via ORAL
  Filled 2019-05-05 (×4): qty 1

## 2019-05-05 MED ORDER — ENOXAPARIN SODIUM 40 MG/0.4ML ~~LOC~~ SOLN
40.0000 mg | SUBCUTANEOUS | Status: DC
  Administered 2019-05-05 – 2019-05-06 (×2): 40 mg via SUBCUTANEOUS
  Filled 2019-05-05 (×2): qty 0.4

## 2019-05-05 MED ORDER — CLONIDINE HCL 0.1 MG PO TABS: 0.1000 mg | ORAL_TABLET | Freq: Once | ORAL | Status: DC

## 2019-05-05 MED ORDER — CLONIDINE HCL 0.1 MG PO TABS: 0.1000 mg | ORAL_TABLET | Freq: Once | ORAL | Status: DC | PRN

## 2019-05-05 MED ORDER — CLONIDINE HCL 0.1 MG PO TABS
0.3000 mg | ORAL_TABLET | Freq: Two times a day (BID) | ORAL | Status: DC
  Administered 2019-05-06 – 2019-05-07 (×3): 0.3 mg via ORAL
  Filled 2019-05-05 (×3): qty 3

## 2019-05-05 MED ORDER — ATORVASTATIN CALCIUM 80 MG PO TABS
80.0000 mg | ORAL_TABLET | Freq: Every evening | ORAL | Status: DC
  Administered 2019-05-05 – 2019-05-06 (×2): 80 mg via ORAL
  Filled 2019-05-05 (×2): qty 1

## 2019-05-05 MED ORDER — POTASSIUM CHLORIDE CRYS ER 20 MEQ PO TBCR
40.0000 meq | EXTENDED_RELEASE_TABLET | Freq: Once | ORAL | Status: AC
  Administered 2019-05-05: 40 meq via ORAL
  Filled 2019-05-05: qty 2

## 2019-05-05 MED ORDER — ACETAMINOPHEN 325 MG PO TABS: 650.0000 mg | ORAL_TABLET | ORAL | Status: DC | PRN

## 2019-05-05 MED ORDER — STROKE: EARLY STAGES OF RECOVERY BOOK
Freq: Once | Status: AC
  Administered 2019-05-05: 23:00:00
  Filled 2019-05-05: qty 1

## 2019-05-05 MED ORDER — ALBUTEROL SULFATE (2.5 MG/3ML) 0.083% IN NEBU: 3.0000 mL | INHALATION_SOLUTION | Freq: Four times a day (QID) | RESPIRATORY_TRACT | Status: DC | PRN

## 2019-05-05 MED ORDER — CLONIDINE HCL 0.2 MG PO TABS: 0.2000 mg | ORAL_TABLET | Freq: Two times a day (BID) | ORAL | Status: DC

## 2019-05-05 MED ORDER — SODIUM CHLORIDE 0.9% FLUSH
3.0000 mL | Freq: Once | INTRAVENOUS | Status: AC
Start: 2019-05-05 — End: 2019-05-06
  Administered 2019-05-06: 3 mL via INTRAVENOUS

## 2019-05-05 MED ORDER — ACETAMINOPHEN 160 MG/5ML PO SOLN: 650.0000 mg | ORAL | Status: DC | PRN

## 2019-05-05 MED ORDER — CLOPIDOGREL BISULFATE 75 MG PO TABS
75.0000 mg | ORAL_TABLET | Freq: Every day | ORAL | Status: DC
  Administered 2019-05-06 – 2019-05-07 (×2): 75 mg via ORAL
  Filled 2019-05-05 (×2): qty 1

## 2019-05-05 MED ORDER — FUROSEMIDE 80 MG PO TABS
80.0000 mg | ORAL_TABLET | Freq: Every day | ORAL | Status: DC
  Administered 2019-05-06 – 2019-05-07 (×2): 80 mg via ORAL
  Filled 2019-05-05 (×2): qty 1

## 2019-05-05 NOTE — ED Notes (Signed)
Attempted report 

## 2019-05-05 NOTE — Consult Note (Signed)
Requesting Physician: Dr. Melina Copa    Chief Complaint: Right side parasthesia, pain and weakness  History obtained from: Patient and Chart     HPI:                                                                                                                                       Sherry Fitzpatrick is a 68 y.o. female with past medical history significant for uncontrolled hypertension, CHF with grade 2 diastolic dysfunction, prior stroke with no residual deficits, sleep apnea, obesity presents to the emergency department as a code stroke for sudden onset right-sided numbness/tingling and weakness.  Patient's last known normal was 3:30 PM.  Patient was brought to ED as a code stroke via EMS.  On arrival, patient had numbness and weakness. Blood pressure was greater than 811 systolic.  NIH stroke scale was 2: For sensory symptoms and lower left extremity weakness.  CT head was unremarkable. Patient did not receive IV TPA as her symptoms were too mild to treat.  However patient is still within TPA window and will not monitor closely to see if symptoms worsen.   Date last known well: 7 21-20 Time last known well: 3:30 PM tPA Given: No, mild and nondisabling NIHSS: 2 Baseline MRS 0  Past Medical History:  Diagnosis Date  . Alterations of sensations, late effect of cerebrovascular disease(438.6)   . Arthritis    "all over"  . Backache, unspecified   . Body mass index 40.0-44.9, adult ([place])   . Carpal tunnel syndrome   . Dizziness and giddiness   . Esophageal reflux   . Generalized pain   . Headache    "had alot til I had pituitary tumor removed"  . Heart murmur   . Other abnormal glucose   . Other and unspecified hyperlipidemia   . Other malaise and fatigue   . Other postprocedural status(V45.89)   . Other symptoms involving cardiovascular system   . Sleep apnea    "did test; they wanted me to get a CPAP but I never did get it" (04/14/2015)  . Stroke ([place]) 06/2003; 02/2010   "minor;  minor" ; denies residual on 04/14/2015  . Unspecified disorder of the pituitary gland and its hypothalamic control   . Unspecified essential hypertension   . Unspecified visual disturbance     Past Surgical History:  Procedure Laterality Date  . CARPAL TUNNEL RELEASE Left ~ 2014  . JOINT REPLACEMENT    . TOTAL HIP ARTHROPLASTY Right 11/06/2010  . TRANSPHENOIDAL / TRANSNASAL HYPOPHYSECTOMY / RESECTION PITUITARY TUMOR  06/13/2012  . TUBAL LIGATION  1974    Family History  Problem Relation Age of Onset  . Heart failure Mother   . Hypertension Mother   . Diabetes Father   . Hypertension Sister   . Heart failure Sister   . Hypertension Daughter    Social History:  reports that she has been  smoking cigarettes. She has never used smokeless tobacco. She reports current alcohol use. She reports that she does not use drugs.  Allergies:  Allergies  Allergen Reactions  . Morphine And Related Hives and Nausea And Vomiting  . Penicillins Hives    Has patient had a PCN reaction causing immediate rash, facial/tongue/throat swelling, SOB or lightheadedness with hypotension: Yes Has patient had a PCN reaction causing severe rash involving mucus membranes or skin necrosis: No Has patient had a PCN reaction that required hospitalization: No Has patient had a PCN reaction occurring within the last 10 years: No If all of the above answers are "NO", then may proceed with Cephalosporin use.  Marland Kitchen Percocet [Oxycodone-Acetaminophen] Nausea And Vomiting  . Ace Inhibitors Nausea And Vomiting and Cough  . Iodinated Diagnostic Agents Nausea And Vomiting       . Iodine Itching  . Etodolac Nausea And Vomiting  . Tramadol Nausea And Vomiting  . Vicodin [Hydrocodone-Acetaminophen] Nausea And Vomiting    Medications:                                                                                                                        I reviewed home medications   ROS:                                                                                                                                      14 systems reviewed and negative except above    Examination:                                                                                                      General: Appears well-developed  Psych: Affect appropriate to situation Eyes: No scleral injection HENT: No OP obstrucion Head: Normocephalic.  Cardiovascular: Normal rate and regular rhythm. Respiratory: Effort normal and breath sounds normal to anterior ascultation GI: Soft.  No distension. There is no tenderness.  Skin: WDI    Neurological Examination Mental Status: Alert, oriented, thought  content appropriate.  Speech fluent without evidence of aphasia. Able to follow 3 step commands without difficulty. Cranial Nerves: II: Visual fields grossly normal,  III,IV, VI: ptosis not present, extra-ocular motions intact bilaterally, pupils equal, round, reactive to light and accommodation V,VII: smile symmetric, facial light touch sensation normal bilaterally VIII: hearing normal bilaterally IX,X: uvula rises symmetrically XI: bilateral shoulder shrug XII: midline tongue extension Motor: Right : Upper extremity   5/5    Left:     Upper extremity   5/5  Lower extremity   4+/5     Lower extremity   5/5 Tone and bulk:normal tone throughout; no atrophy noted Sensory: Reduced sensation to light touch over the right arm and right leg Deep Tendon Reflexes: 2+ and symmetric throughout Plantars: Right: downgoing   Left: downgoing Cerebellar: normal finger-to-nose, normal rapid alternating movements and normal heel-to-shin test Gait: Not assessed     Lab Results: Basic Metabolic Panel: Recent Labs  Lab 05/05/19 1638 05/05/19 1642  NA 139 141  K 3.0* 3.1*  CL 103 102  CO2 25  --   GLUCOSE 118* 113*  BUN 19 21  CREATININE 1.21* 1.20*  CALCIUM 8.9  --     CBC: Recent Labs  Lab 05/05/19 1638 05/05/19 1642  WBC 7.9  --    NEUTROABS 3.9  --   HGB 12.6 12.9  HCT 38.4 38.0  MCV 87.9  --   PLT 301  --     Coagulation Studies: Recent Labs    05/05/19 1638  LABPROT 13.2  INR 1.0    Imaging: Dg Chest 2 View  Result Date: 05/05/2019 CLINICAL DATA:  Right-sided chest pain. Acute ischemic stroke. EXAM: CHEST - 2 VIEW COMPARISON:  07/05/2018 and 07/01/2018 FINDINGS: The heart size and pulmonary vascularity are normal. Lungs are clear. No effusions. No significant bone abnormality. IMPRESSION: No active cardiopulmonary disease. Electronically Signed   By: [name] M.D.   On: 05/05/2019 20:15   Ct Head Code Stroke Wo Contrast  Result Date: 05/05/2019 CLINICAL DATA:  Code stroke. Right-sided weakness and facial droop. Last seen normal 1530 hours EXAM: CT HEAD WITHOUT CONTRAST TECHNIQUE: Contiguous axial images were obtained from the base of the skull through the vertex without intravenous contrast. COMPARISON:  01/10/2017 FINDINGS: Brain: No evidence of acute cortical or large vessel territory infarction. Areas of low-density in the basal ganglia and internal capsule regions presumed represent old small vessel infarctions. No mass lesion, hemorrhage, hydrocephalus or extra-axial collection. Vascular: There is atherosclerotic calcification of the major vessels at the base of the brain. Skull: Negative Sinuses/Orbits: Clear/normal Other: None ASPECTS ([place] Stroke Program Early CT Score) - Ganglionic level infarction (caudate, lentiform nuclei, internal capsule, insula, M1-M3 cortex): 7 - Supraganglionic infarction (M4-M6 cortex): 3 Total score (0-10 with 10 being normal): 10 IMPRESSION: 1. No acute finding suspected by CT. No sign of large vessel insult. Areas of low-density in the basal ganglia and internal capsule regions consistent with old small vessel infarctions. 2. ASPECTS is 10 3. These results were communicated to Dr. Lorraine Lax at 4:52 pmon 7/21/2020by text page via the [facility] messaging system. Electronically  Signed   By: Nelson Chimes M.D.   On: 05/05/2019 16:54     ASSESSMENT AND PLAN   68 y.o. female with past medical history significant for uncontrolled hypertension, CHF with grade 2 diastolic dysfunction, prior stroke with no residual deficits, sleep apnea, obesity presents to the emergency department as a code stroke for sudden onset right-sided numbness/tingling and  weakness.   TIA versus hypertensive crisis  Recommend # MRI of the brain without contrast, if MRI brain negative gradually bring blood pressure down to less than 256 systolic #Transthoracic Echo  # Start patient on ASA 325mg  daily #Start or continue Atorvastatin 80 mg/other high intensity statin # BP goal: permissive HTN upto 220/120 mmHg # HBAIC and Lipid profile # Telemetry monitoring # Frequent neuro checks #stroke swallow screen  Please page stroke NP  Or  PA  Or MD from 8am -4 pm  as this patient from this time will be  followed by the stroke.   You can look them up on [email or web address]  Password [email or web address]      [facility] Pager Number [phone]   Stroke vs TIA vs HTN emergency NIHSS only 2, no tPA Still within window till 8 pm, will reconsider if she worsens with NIH >4

## 2019-05-05 NOTE — Code Documentation (Signed)
Patient last known well at 1530 this afternoon when she suddenly experienced right arm and leg weakness and right facial droop.  Per patient she has a history of TIA with complete symptom resolution.  Code Stroke called en route at 1627.  Patient arrived at 1636  Stat labs and Head CT done.  Dr Aroor at bedside to assess patient.  Initially noted right arm and leg drift a few minutes later Right arm improved.  NIHSS 2 right leg drift and decreased sensory.   Patient remains in the TPA window until 2000, frequent VS and neuro checks.    Hand off with [place] RN

## 2019-05-05 NOTE — ED Provider Notes (Signed)
[facility] EMERGENCY DEPARTMENT Provider Note   CSN: [id number] Arrival date & time: 05/05/19  1636     History   Chief Complaint Chief Complaint  Patient presents with   Code Stroke    HPI Sherry Fitzpatrick is a 67 y.o. female.  She has a history of hypertension and prior stroke with no residual deficits along with CHF.  She is presenting with acute onset of right face right arm numbness weakness starting around 3:30 PM.  Her glucose was high normal by EMS and her blood pressure was elevated.  She was a stroke activation prehospital and was met at the ambulance entrance by stroke team and myself.     The history is provided by the patient and the EMS personnel.  Cerebrovascular Accident This is a new problem. The current episode started 1 to 2 hours ago. The problem occurs constantly. The problem has been gradually improving. Pertinent negatives include no chest pain, no abdominal pain, no headaches and no shortness of breath. Nothing aggravates the symptoms. Nothing relieves the symptoms. She has tried nothing for the symptoms. The treatment provided no relief.    Past Medical History:  Diagnosis Date   Alterations of sensations, late effect of cerebrovascular disease(438.6)    Arthritis    "all over"   Backache, unspecified    Body mass index 40.0-44.9, adult (HCC)    Carpal tunnel syndrome    Dizziness and giddiness    Esophageal reflux    Generalized pain    Headache    "had alot til I had pituitary tumor removed"   Heart murmur    Other abnormal glucose    Other and unspecified hyperlipidemia    Other malaise and fatigue    Other postprocedural status(V45.89)    Other symptoms involving cardiovascular system    Sleep apnea    "did test; they wanted me to get a CPAP but I never did get it" (04/14/2015)   Stroke ([place]) 06/2003; 02/2010   "minor; minor" ; denies residual on 04/14/2015   Unspecified disorder of the pituitary gland and  its hypothalamic control    Unspecified essential hypertension    Unspecified visual disturbance     Patient Active Problem List   Diagnosis Date Noted   AKI (acute kidney injury) ([place]) 07/03/2018   Hypertensive crisis 07/01/2018   Tobacco abuse 07/01/2018   Hypokalemia 07/01/2018   Acute on chronic diastolic CHF (congestive heart failure) ([place]) 07/01/2018   Positive D dimer    Hypertensive urgency 02/22/2018   Malignant hypertension 02/20/2018   Shortness of breath 02/20/2018   Stroke ([place])    Chest pain 04/14/2015   Right sided weakness 04/14/2015   Hyperlipidemia    Body mass index 40.0-44.9, adult ([place])    Essential hypertension    Blood glucose elevated    Asthma    OSA (obstructive sleep apnea) 05/06/2013    Past Surgical History:  Procedure Laterality Date   CARPAL TUNNEL RELEASE Left ~ 2014   JOINT REPLACEMENT     TOTAL HIP ARTHROPLASTY Right 11/06/2010   TRANSPHENOIDAL / TRANSNASAL HYPOPHYSECTOMY / RESECTION PITUITARY TUMOR  06/13/2012   TUBAL LIGATION  1974     OB History   No obstetric history on file.      Home Medications    Prior to Admission medications   Medication Sig Start Date End Date Taking? Authorizing Provider  acetaminophen (TYLENOL 8 HOUR ARTHRITIS PAIN) 650 MG CR tablet Take 1,300 mg by mouth every  8 (eight) hours as needed for pain.    [provider]  albuterol (PROVENTIL HFA;VENTOLIN HFA) 108 (90 Base) MCG/ACT inhaler Inhale 2 puffs into the lungs every 6 (six) hours as needed for wheezing. 02/24/18   Danford, Suann Larry, MD  amLODipine (NORVASC) 10 MG tablet Take 1 tablet (10 mg total) by mouth daily. 02/24/18   Danford, Suann Larry, MD  atorvastatin (LIPITOR) 80 MG tablet Take 1 tablet (80 mg total) by mouth daily. 02/24/18   Danford, Suann Larry, MD  clopidogrel (PLAVIX) 75 MG tablet Take 1 tablet (75 mg total) by mouth daily. 02/24/18   Danford, Suann Larry, MD  dextromethorphan-guaiFENesin  (MUCINEX DM) 30-600 MG 12hr tablet Take 1 tablet by mouth 2 (two) times daily. Patient not taking: Reported on 07/02/2018 02/24/18   Edwin Dada, MD  furosemide (LASIX) 40 MG tablet Take 1 tablet (40 mg total) by mouth 2 (two) times daily. 07/05/18 08/04/18  Shahmehdi, Valeria Batman, MD  metoprolol succinate (TOPROL-XL) 25 MG 24 hr tablet Take 1 tablet (25 mg total) by mouth daily. 02/24/18   Danford, Suann Larry, MD  nitroGLYCERIN (NITROSTAT) 0.4 MG SL tablet Place 1 tablet (0.4 mg total) under the tongue every 5 (five) minutes as needed for chest pain (hold for SBP < 110). 04/16/15   Rai, Ripudeep K, MD  potassium chloride (K-DUR) 10 MEQ tablet Take 2 tablets (20 mEq total) by mouth daily. 07/05/18   Shahmehdi, Valeria Batman, MD  traMADol (ULTRAM) 50 MG tablet Take 50 mg by mouth every 6 (six) hours as needed for pain. Reported on 02/09/2016    [provider]  valsartan (DIOVAN) 160 MG tablet Take 1 tablet (160 mg total) by mouth daily. 02/24/18   Danford, Suann Larry, MD    Family History Family History  Problem Relation Age of Onset   Heart failure Mother    Hypertension Mother    Diabetes Father    Hypertension Sister    Heart failure Sister    Hypertension Daughter     Social History Social History   Tobacco Use   Smoking status: Current Some Day Smoker    Types: Cigarettes   Smokeless tobacco: Never Used  Substance Use Topics   Alcohol use: Yes    Alcohol/week: 0.0 standard drinks    Comment: rarely; "I don't drink.  But I like it."   Drug use: No     Allergies   Morphine and related, Penicillins, Percocet [oxycodone-acetaminophen], Ace inhibitors, Iodinated diagnostic agents, Iodine, Etodolac, and Vicodin [hydrocodone-acetaminophen]   Review of Systems Review of Systems  Constitutional: Negative for fever.  HENT: Negative for sore throat.   Eyes: Negative for visual disturbance.  Respiratory: Negative for shortness of breath.   Cardiovascular:  Negative for chest pain.  Gastrointestinal: Negative for abdominal pain.  Genitourinary: Negative for dysuria.  Musculoskeletal: Negative for neck pain.  Skin: Negative for rash.  Neurological: Positive for weakness and numbness. Negative for headaches.     Physical Exam Updated Vital Signs BP (!) 163/83 (BP Location: Left Arm)    Pulse (!) 56    Temp 98.4 F (36.9 C) (Oral)    Resp 18    Ht '5\' 3"'  (1.6 m)    Wt 113.2 kg    SpO2 100%    BMI 44.21 kg/m   Physical Exam Vitals signs and nursing note reviewed.  Constitutional:      General: She is not in acute distress.    Appearance: She is well-developed.  HENT:  Head: Normocephalic and atraumatic.  Eyes:     Conjunctiva/sclera: Conjunctivae normal.  Neck:     Musculoskeletal: Neck supple.  Cardiovascular:     Rate and Rhythm: Normal rate and regular rhythm.     Heart sounds: No murmur.  Pulmonary:     Effort: Pulmonary effort is normal. No respiratory distress.     Breath sounds: Normal breath sounds.  Abdominal:     Palpations: Abdomen is soft.     Tenderness: There is no abdominal tenderness.  Musculoskeletal: Normal range of motion.  Skin:    General: Skin is warm and dry.     Capillary Refill: Capillary refill takes less than 2 seconds.  Neurological:     Mental Status: She is alert and oriented to person, place, and time.     Sensory: Sensory deficit present.     Motor: Weakness present.     Comments: Cranial nerves seem intact.  She has some decrease sensation on her right arm and right leg.  Grip strength is 5 out of 5 and she is able to hold her arm out for 5 seconds.  Leg strength is probably 4+ out of 5 on the right.  Left side upper and lower normal.      ED Treatments / Results  Labs (all labs ordered are listed, but only abnormal results are displayed) Labs Reviewed  CBC - Abnormal; Notable for the following components:      Result Value   RDW 15.6 (*)    All other components within normal limits    COMPREHENSIVE METABOLIC PANEL - Abnormal; Notable for the following components:   Potassium 3.0 (*)    Glucose, Bld 118 (*)    Creatinine, Ser 1.21 (*)    Albumin 3.4 (*)    Total Bilirubin 0.2 (*)    GFR calc non Af Amer 46 (*)    GFR calc Af Amer 53 (*)    All other components within normal limits  HEMOGLOBIN A1C - Abnormal; Notable for the following components:   Hgb A1c MFr Bld 6.9 (*)    All other components within normal limits  LIPID PANEL - Abnormal; Notable for the following components:   HDL 34 (*)    LDL Cholesterol 104 (*)    All other components within normal limits  BASIC METABOLIC PANEL - Abnormal; Notable for the following components:   Potassium 3.3 (*)    Glucose, Bld 127 (*)    Creatinine, Ser 1.26 (*)    Calcium 8.7 (*)    GFR calc non Af Amer 43 (*)    GFR calc Af Amer 50 (*)    All other components within normal limits  I-STAT CHEM 8, ED - Abnormal; Notable for the following components:   Potassium 3.1 (*)    Creatinine, Ser 1.20 (*)    Glucose, Bld 113 (*)    Calcium, Ion 1.08 (*)    All other components within normal limits  CBG MONITORING, ED - Abnormal; Notable for the following components:   Glucose-Capillary 106 (*)    All other components within normal limits  SARS CORONAVIRUS 2 (HOSPITAL ORDER, [facility])  PROTIME-INR  APTT  DIFFERENTIAL    EKG EKG Interpretation  Date/Time:  Tuesday May 05 2019 16:59:50 EDT Ventricular Rate:  62 PR Interval:    QRS Duration: 96 QT Interval:  440 QTC Calculation: 447 R Axis:   76 Text Interpretation:  Sinus rhythm Probable anterior infarct, age indeterminate Abnormal  T, consider ischemia, diffuse leads Lateral leads are also involved similar to prior 9/19 Confirmed by Aletta Edouard [phone]) on 05/05/2019 5:09:43 PM Also confirmed by Aletta Edouard [phone]), editor Philomena Doheny [phone])  on 05/06/2019 8:30:46 AM   Radiology Dg Chest 2 View  Result Date:  05/05/2019 CLINICAL DATA:  Right-sided chest pain. Acute ischemic stroke. EXAM: CHEST - 2 VIEW COMPARISON:  07/05/2018 and 07/01/2018 FINDINGS: The heart size and pulmonary vascularity are normal. Lungs are clear. No effusions. No significant bone abnormality. IMPRESSION: No active cardiopulmonary disease. Electronically Signed   By: [name] M.D.   On: 05/05/2019 20:15   Mr Brain Wo Contrast  Result Date: 05/06/2019 CLINICAL DATA:  Focal neurologic deficit. Right-sided numbness and weakness. EXAM: MRI HEAD WITHOUT CONTRAST TECHNIQUE: Multiplanar, multiecho pulse sequences of the brain and surrounding structures were obtained without intravenous contrast. COMPARISON:  Head CT 05/05/2019 FINDINGS: BRAIN: There is no acute infarct, acute hemorrhage or extra-axial collection. The midline structures are normal. There is no midline shift or mass effect. Early confluent hyperintense T2-weighted signal of the periventricular and deep white matter, most commonly due to chronic ischemic microangiopathy. The cerebral and cerebellar volume are age-appropriate. There is no hydrocephalus. Susceptibility-sensitive sequences show no chronic microhemorrhage or superficial siderosis. VASCULAR: The major intracranial arterial and venous sinus flow voids are normal. SKULL AND UPPER CERVICAL SPINE: Calvarial bone marrow signal is normal. There is no skull base mass. The visualized upper cervical spine and soft tissues are normal. SINUSES/ORBITS: There are no fluid levels or advanced mucosal thickening. The mastoid air cells and middle ear cavities are free of fluid. The orbits are normal. IMPRESSION: Chronic ischemic microangiopathy without acute intracranial abnormality. Electronically Signed   By: Ulyses Jarred M.D.   On: 05/06/2019 00:42   Ct Head Code Stroke Wo Contrast  Result Date: 05/05/2019 CLINICAL DATA:  Code stroke. Right-sided weakness and facial droop. Last seen normal 1530 hours EXAM: CT HEAD WITHOUT  CONTRAST TECHNIQUE: Contiguous axial images were obtained from the base of the skull through the vertex without intravenous contrast. COMPARISON:  01/10/2017 FINDINGS: Brain: No evidence of acute cortical or large vessel territory infarction. Areas of low-density in the basal ganglia and internal capsule regions presumed represent old small vessel infarctions. No mass lesion, hemorrhage, hydrocephalus or extra-axial collection. Vascular: There is atherosclerotic calcification of the major vessels at the base of the brain. Skull: Negative Sinuses/Orbits: Clear/normal Other: None ASPECTS ([place] Stroke Program Early CT Score) - Ganglionic level infarction (caudate, lentiform nuclei, internal capsule, insula, M1-M3 cortex): 7 - Supraganglionic infarction (M4-M6 cortex): 3 Total score (0-10 with 10 being normal): 10 IMPRESSION: 1. No acute finding suspected by CT. No sign of large vessel insult. Areas of low-density in the basal ganglia and internal capsule regions consistent with old small vessel infarctions. 2. ASPECTS is 10 3. These results were communicated to Dr. Lorraine Lax at 4:52 pmon 7/21/2020by text page via the [facility] messaging system. Electronically Signed   By: Nelson Chimes M.D.   On: 05/05/2019 16:54   Vas US Carotid (at [facility] Only)  Result Date: 05/06/2019 Carotid Arterial Duplex Study Indications:       CVA and Weakness and numbness on the right side face, arm and                    leg. Risk Factors:      Hypertension. Comparison Study:  Prior study on 04/30/15 showed bilateral 1-39% stenosis. No  change. Performing Technologist: Oda Cogan RDMS, RVT  Examination Guidelines: A complete evaluation includes B-mode imaging, spectral Doppler, color Doppler, and power Doppler as needed of all accessible portions of each vessel. Bilateral testing is considered an integral part of a complete examination. Limited examinations for reoccurring indications may be performed as noted.  Right  Carotid Findings: +----------+--------+--------+--------+--------+--------+             PSV cm/s EDV cm/s Stenosis Describe Comments  +----------+--------+--------+--------+--------+--------+  CCA Prox   103      15                                   +----------+--------+--------+--------+--------+--------+  CCA Distal 52       16                                   +----------+--------+--------+--------+--------+--------+  ICA Prox   68       24       1-39%                       +----------+--------+--------+--------+--------+--------+  ICA Distal 89       26                                   +----------+--------+--------+--------+--------+--------+  ECA        100      10                                   +----------+--------+--------+--------+--------+--------+ +----------+--------+-------+----------------+-------------------+             PSV cm/s EDV cms Describe         Arm Pressure (mmHG)  +----------+--------+-------+----------------+-------------------+  Subclavian 138              Multiphasic, WNL                      +----------+--------+-------+----------------+-------------------+ +---------+--------+--+--------+--+---------+  Vertebral PSV cm/s 29 EDV cm/s 13 Antegrade  +---------+--------+--+--------+--+---------+  Left Carotid Findings: +----------+--------+--------+--------+--------+--------+             PSV cm/s EDV cm/s Stenosis Describe Comments  +----------+--------+--------+--------+--------+--------+  CCA Prox   65       11                                   +----------+--------+--------+--------+--------+--------+  CCA Distal 47       13                                   +----------+--------+--------+--------+--------+--------+  ICA Prox   50       23       1-39%                       +----------+--------+--------+--------+--------+--------+  ICA Distal 76       33                                   +----------+--------+--------+--------+--------+--------+  ECA        100      15                                    +----------+--------+--------+--------+--------+--------+ +----------+--------+--------+----------------+-------------------+  Subclavian PSV cm/s EDV cm/s Describe         Arm Pressure (mmHG)  +----------+--------+--------+----------------+-------------------+             80                Multiphasic, WNL                      +----------+--------+--------+----------------+-------------------+ +---------+--------+--+--------+--+---------+  Vertebral PSV cm/s 43 EDV cm/s 16 Antegrade  +---------+--------+--+--------+--+---------+  Summary: Right Carotid: Velocities in the right ICA are consistent with a 1-39% stenosis. Left Carotid: Velocities in the left ICA are consistent with a 1-39% stenosis. Vertebrals: Bilateral vertebral arteries demonstrate antegrade flow. *See table(s) above for measurements and observations.     Preliminary     Procedures .Critical Care Performed by: Hayden Rasmussen, MD Authorized by: Hayden Rasmussen, MD   Critical care provider statement:    Critical care time (minutes):  45   Critical care time was exclusive of:  Separately billable procedures and treating other patients   Critical care was necessary to treat or prevent imminent or life-threatening deterioration of the following conditions:  CNS failure or compromise   Critical care was time spent personally by me on the following activities:  Discussions with consultants, evaluation of patient's response to treatment, examination of patient, ordering and performing treatments and interventions, ordering and review of laboratory studies, ordering and review of radiographic studies, pulse oximetry, re-evaluation of patient's condition, obtaining history from patient or surrogate, review of old charts and development of treatment plan with patient or surrogate   I assumed direction of critical care for this patient from another provider in my specialty: no     (including critical care time)  Medications  Ordered in ED Medications  sodium chloride flush (NS) 0.9 % injection 3 mL (has no administration in time range)     Initial Impression / Assessment and Plan / ED Course  I have reviewed the triage vital signs and the nursing notes.  Pertinent labs & imaging results that were available during my care of the patient were reviewed by me and considered in my medical decision making (see chart for details).  Clinical Course as of May 06 1535  Tue May 04, 4648  1766 69 year old female with prior history of stroke and elevated blood pressure here with acute neurologic deficit of right face right arm numbness weakness.  Markedly hypertensive on presentation with EMS improving here.  Neuro symptoms also improving on arrival.  Differential includes CVA, bleed, hypertensive emergency,   [MB]  1646 Patient's initial head CT reviewed by me, no gross bleed identified.  Awaiting radiology reading.   [MB]  1706 Patient has been evaluated by Dr. Lorraine Lax from neurology.  He is giving her an NIH of 2 at the moment for some leg drift and numbness.  Her blood pressure is elevated here and he is recommending permissive hypertension with parameters to keep below 220/120.  He said she still may be a lytic candidate if her symptoms worsen over the next hour to hour and a half and will do serial neuro checks.  She will ultimately need to be admitted but  if she requires TPA then she would need to go to the unit.   [MB]  1922 Discussed with Dr. Alcario Drought from the hospitalist service who will evaluate the patient for admission.   [MB]    Clinical Course User Index [MB] Hayden Rasmussen, MD   Sherry Fitzpatrick was evaluated in Emergency Department on 05/05/2019 for the symptoms described in the history of present illness. She was evaluated in the context of the global COVID-19 pandemic, which necessitated consideration that the patient might be at risk for infection with the SARS-CoV-2 virus that causes COVID-19.  Institutional protocols and algorithms that pertain to the evaluation of patients at risk for COVID-19 are in a state of rapid change based on information released by regulatory bodies including the CDC and federal and state organizations. These policies and algorithms were followed during the patient's care in the ED.      Final Clinical Impressions(s) / ED Diagnoses   Final diagnoses:  Cerebrovascular accident (CVA), unspecified mechanism ([place])  Hypertension, unspecified type  Acute right-sided weakness    ED Discharge Orders    None       Hayden Rasmussen, MD 05/06/19 1537

## 2019-05-05 NOTE — ED Notes (Signed)
ED TO INPATIENT HANDOFF REPORT  ED Nurse Name and Phone #: Tray Martinique, [phone]  S Name/Age/Gender Sherry Fitzpatrick 68 y.o. female Room/Bed: 012C/012C  Code Status   Code Status: Full Code  Home/SNF/Other Home Patient oriented to: self, place, time and situation Is this baseline? Yes   Triage Complete: Triage complete  Chief Complaint code stroke  Triage Note Pt here from home as a code stroke with lsn at 1530 pt with right side weakness and numbness to arm and leg ,   Allergies Allergies  Allergen Reactions  . Morphine And Related Hives and Nausea And Vomiting  . Penicillins Hives    Has patient had a PCN reaction causing immediate rash, facial/tongue/throat swelling, SOB or lightheadedness with hypotension: Yes Has patient had a PCN reaction causing severe rash involving mucus membranes or skin necrosis: No Has patient had a PCN reaction that required hospitalization: No Has patient had a PCN reaction occurring within the last 10 years: No If all of the above answers are "NO", then may proceed with Cephalosporin use.  Marland Kitchen Percocet [Oxycodone-Acetaminophen] Nausea And Vomiting  . Ace Inhibitors Nausea And Vomiting and Cough  . Iodinated Diagnostic Agents Nausea And Vomiting       . Iodine Itching  . Etodolac Nausea And Vomiting  . Tramadol Nausea And Vomiting  . Vicodin [Hydrocodone-Acetaminophen] Nausea And Vomiting    Level of Care/Admitting Diagnosis ED Disposition    ED Disposition Condition [facility]: [place] [100100]  Level of Care: Telemetry Medical [104]  Covid Evaluation: Asymptomatic Screening Protocol (No Symptoms)  Diagnosis: Acute ischemic stroke [facility]) [[id number]]  Admitting Physician: Doreatha Massed  Attending Physician: Etta Quill [phone]  Estimated length of stay: past midnight tomorrow  Certification:: I certify this patient will need inpatient services for at least 2 midnights  PT Class (Do  Not Modify): Inpatient [101]  PT Acc Code (Do Not Modify): Private [1]       B Medical/Surgery History Past Medical History:  Diagnosis Date  . Alterations of sensations, late effect of cerebrovascular disease(438.6)   . Arthritis    "all over"  . Backache, unspecified   . Body mass index 40.0-44.9, adult ([place])   . Carpal tunnel syndrome   . Dizziness and giddiness   . Esophageal reflux   . Generalized pain   . Headache    "had alot til I had pituitary tumor removed"  . Heart murmur   . Other abnormal glucose   . Other and unspecified hyperlipidemia   . Other malaise and fatigue   . Other postprocedural status(V45.89)   . Other symptoms involving cardiovascular system   . Sleep apnea    "did test; they wanted me to get a CPAP but I never did get it" (04/14/2015)  . Stroke ([place]) 06/2003; 02/2010   "minor; minor" ; denies residual on 04/14/2015  . Unspecified disorder of the pituitary gland and its hypothalamic control   . Unspecified essential hypertension   . Unspecified visual disturbance    Past Surgical History:  Procedure Laterality Date  . CARPAL TUNNEL RELEASE Left ~ 2014  . JOINT REPLACEMENT    . TOTAL HIP ARTHROPLASTY Right 11/06/2010  . TRANSPHENOIDAL / TRANSNASAL HYPOPHYSECTOMY / RESECTION PITUITARY TUMOR  06/13/2012  . TUBAL LIGATION  1974     A IV Location/Drains/Wounds Patient Lines/Drains/Airways Status   Active Line/Drains/Airways    Name:   Placement date:   Placement time:  Site:   Days:   Peripheral IV 05/05/19 Right Antecubital   05/05/19    1712    Antecubital   less than 1          Intake/Output Last 24 hours No intake or output data in the 24 hours ending 05/05/19 2049  Labs/Imaging Results for orders placed or performed during the hospital encounter of 05/05/19 (from the past 48 hour(s))  Protime-INR     Status: None   Collection Time: 05/05/19  4:38 PM  Result Value Ref Range   Prothrombin Time 13.2 11.4 - 15.2 seconds   INR 1.0 0.8 -  1.2    Comment: (NOTE) INR goal varies based on device and disease states. Performed at [facility], [place] [address], [place], [place] [address]   APTT     Status: None   Collection Time: 05/05/19  4:38 PM  Result Value Ref Range   aPTT 30 24 - 36 seconds    Comment: Performed at [facility] [address] [place], [place] [address]  CBC     Status: Abnormal   Collection Time: 05/05/19  4:38 PM  Result Value Ref Range   WBC 7.9 4.0 - 10.5 K/uL   RBC 4.37 3.87 - 5.11 MIL/uL   Hemoglobin 12.6 12.0 - 15.0 g/dL   HCT 38.4 36.0 - 46.0 %   MCV 87.9 80.0 - 100.0 fL   MCH 28.8 26.0 - 34.0 pg   MCHC 32.8 30.0 - 36.0 g/dL   RDW 15.6 (H) 11.5 - 15.5 %   Platelets 301 150 - 400 K/uL   nRBC 0.0 0.0 - 0.2 %    Comment: Performed at [facility], [place] [address], [place], [place] [address]  Differential     Status: None   Collection Time: 05/05/19  4:38 PM  Result Value Ref Range   Neutrophils Relative % 48 %   Neutro Abs 3.9 1.7 - 7.7 K/uL   Lymphocytes Relative 35 %   Lymphs Abs 2.8 0.7 - 4.0 K/uL   Monocytes Relative 11 %   Monocytes Absolute 0.9 0.1 - 1.0 K/uL   Eosinophils Relative 5 %   Eosinophils Absolute 0.4 0.0 - 0.5 K/uL   Basophils Relative 1 %   Basophils Absolute 0.1 0.0 - 0.1 K/uL   Immature Granulocytes 0 %   Abs Immature Granulocytes 0.02 0.00 - 0.07 K/uL    Comment: Performed at [facility], [place] [address] [place], [place] [address]  Comprehensive metabolic panel     Status: Abnormal   Collection Time: 05/05/19  4:38 PM  Result Value Ref Range   Sodium 139 135 - 145 mmol/L   Potassium 3.0 (L) 3.5 - 5.1 mmol/L   Chloride 103 98 - 111 mmol/L   CO2 25 22 - 32 mmol/L   Glucose, Bld 118 (H) 70 - 99 mg/dL   BUN 19 8 - 23 mg/dL   Creatinine, Ser 1.21 (H) 0.44 - 1.00 mg/dL   Calcium 8.9 8.9 - 10.3 mg/dL   Total Protein 7.3 6.5 - 8.1 g/dL   Albumin 3.4 (L) 3.5 - 5.0 g/dL   AST 19 15 - 41 U/L   ALT 16 0 - 44 U/L   Alkaline Phosphatase 65 38  - 126 U/L   Total Bilirubin 0.2 (L) 0.3 - 1.2 mg/dL   GFR calc non Af Amer 46 (L) >60 mL/min   GFR calc Af Amer 53 (L) >60 mL/min  Anion gap 11 5 - 15    Comment: Performed at [facility] [address] [place], [place] [address]  I-stat chem 8, ED     Status: Abnormal   Collection Time: 05/05/19  4:42 PM  Result Value Ref Range   Sodium 141 135 - 145 mmol/L   Potassium 3.1 (L) 3.5 - 5.1 mmol/L   Chloride 102 98 - 111 mmol/L   BUN 21 8 - 23 mg/dL   Creatinine, Ser 1.20 (H) 0.44 - 1.00 mg/dL   Glucose, Bld 113 (H) 70 - 99 mg/dL   Calcium, Ion 1.08 (L) 1.15 - 1.40 mmol/L   TCO2 26 22 - 32 mmol/L   Hemoglobin 12.9 12.0 - 15.0 g/dL   HCT 38.0 36.0 - 46.0 %  CBG monitoring, ED     Status: Abnormal   Collection Time: 05/05/19  5:09 PM  Result Value Ref Range   Glucose-Capillary 106 (H) 70 - 99 mg/dL   Comment 1 Notify RN    Comment 2 Document in Chart   SARS Coronavirus 2 (CEPHEID - Performed in [facility] hospital lab), Hosp Order     Status: None   Collection Time: 05/05/19  5:42 PM   Specimen: Nasopharyngeal Swab  Result Value Ref Range   SARS Coronavirus 2 NEGATIVE NEGATIVE    Comment: (NOTE) If result is NEGATIVE SARS-CoV-2 target nucleic acids are NOT DETECTED. The SARS-CoV-2 RNA is generally detectable in upper and lower  respiratory specimens during the acute phase of infection. The lowest  concentration of SARS-CoV-2 viral copies this assay can detect is 250  copies / mL. A negative result does not preclude SARS-CoV-2 infection  and should not be used as the sole basis for treatment or other  patient management decisions.  A negative result may occur with  improper specimen collection / handling, submission of specimen other  than nasopharyngeal swab, presence of viral mutation(s) within the  areas targeted by this assay, and inadequate number of viral copies  (<250 copies / mL). A negative result must be combined with clinical  observations, patient history,  and epidemiological information. If result is POSITIVE SARS-CoV-2 target nucleic acids are DETECTED. The SARS-CoV-2 RNA is generally detectable in upper and lower  respiratory specimens dur ing the acute phase of infection.  Positive  results are indicative of active infection with SARS-CoV-2.  Clinical  correlation with patient history and other diagnostic information is  necessary to determine patient infection status.  Positive results do  not rule out bacterial infection or co-infection with other viruses. If result is PRESUMPTIVE POSTIVE SARS-CoV-2 nucleic acids MAY BE PRESENT.   A presumptive positive result was obtained on the submitted specimen  and confirmed on repeat testing.  While 2019 novel coronavirus  (SARS-CoV-2) nucleic acids may be present in the submitted sample  additional confirmatory testing may be necessary for epidemiological  and / or clinical management purposes  to differentiate between  SARS-CoV-2 and other Sarbecovirus currently known to infect humans.  If clinically indicated additional testing with an alternate test  methodology [phone]) is advised. The SARS-CoV-2 RNA is generally  detectable in upper and lower respiratory sp ecimens during the acute  phase of infection. The expected result is Negative. Fact Sheet for Patients:  StrictlyIdeas.no Fact Sheet for Healthcare Providers: BankingDealers.co.za This test is not yet approved or cleared by the [place] FDA and has been authorized for detection and/or diagnosis of SARS-CoV-2 by FDA under an Emergency Use Authorization (EUA).  This EUA will remain in effect (meaning this test can be used) for the duration of the COVID-19 declaration under Section 564(b)(1) of the Act, 21 U.S.C. section 360bbb-3(b)(1), unless the authorization is terminated or revoked sooner. Performed at [facility], [place] [address] [place], [place] [address]    Dg  Chest 2 View  Result Date: 05/05/2019 CLINICAL DATA:  Right-sided chest pain. Acute ischemic stroke. EXAM: CHEST - 2 VIEW COMPARISON:  07/05/2018 and 07/01/2018 FINDINGS: The heart size and pulmonary vascularity are normal. Lungs are clear. No effusions. No significant bone abnormality. IMPRESSION: No active cardiopulmonary disease. Electronically Signed   By: [name] M.D.   On: 05/05/2019 20:15   Ct Head Code Stroke Wo Contrast  Result Date: 05/05/2019 CLINICAL DATA:  Code stroke. Right-sided weakness and facial droop. Last seen normal 1530 hours EXAM: CT HEAD WITHOUT CONTRAST TECHNIQUE: Contiguous axial images were obtained from the base of the skull through the vertex without intravenous contrast. COMPARISON:  01/10/2017 FINDINGS: Brain: No evidence of acute cortical or large vessel territory infarction. Areas of low-density in the basal ganglia and internal capsule regions presumed represent old small vessel infarctions. No mass lesion, hemorrhage, hydrocephalus or extra-axial collection. Vascular: There is atherosclerotic calcification of the major vessels at the base of the brain. Skull: Negative Sinuses/Orbits: Clear/normal Other: None ASPECTS ([place] Stroke Program Early CT Score) - Ganglionic level infarction (caudate, lentiform nuclei, internal capsule, insula, M1-M3 cortex): 7 - Supraganglionic infarction (M4-M6 cortex): 3 Total score (0-10 with 10 being normal): 10 IMPRESSION: 1. No acute finding suspected by CT. No sign of large vessel insult. Areas of low-density in the basal ganglia and internal capsule regions consistent with old small vessel infarctions. 2. ASPECTS is 10 3. These results were communicated to Dr. Lorraine Lax at 4:52 pmon 7/21/2020by text page via the [facility] messaging system. Electronically Signed   By: Nelson Chimes M.D.   On: 05/05/2019 16:54    Pending Labs Unresulted Labs (From admission, onward)    Start     Ordered   05/06/19 0500  Hemoglobin A1c  Tomorrow morning,    R     05/05/19 1952   05/06/19 0500  Lipid panel  Tomorrow morning,   R    Comments: Fasting    05/05/19 1952   05/06/19 7654  Basic metabolic panel  Tomorrow morning,   R     05/05/19 2015          Vitals/Pain Today's Vitals   05/05/19 1930 05/05/19 1945 05/05/19 2000 05/05/19 2015  BP: (!) 169/75 (!) 147/89 (!) 167/95 (!) 165/77  Pulse: (!) 57 (!) 57 (!) 55 67  Resp: (!) 21 19 16    Temp:      TempSrc:      SpO2: 99% 97% 99% 100%  Weight:      PainSc:        Isolation Precautions No active isolations  Medications Medications  sodium chloride flush (NS) 0.9 % injection 3 mL (has no administration in time range)  clopidogrel (PLAVIX) tablet 75 mg (has no administration in time range)  furosemide (LASIX) tablet 80 mg (has no administration in time range)  potassium chloride (K-DUR) CR tablet 10 mEq (has no administration in time range)  atorvastatin (LIPITOR) tablet 80 mg (has no administration in time range)  albuterol (PROVENTIL) (2.5 MG/3ML) 0.083% nebulizer solution 3 mL (has no administration in time range)  potassium chloride SA (K-DUR) CR tablet 40 mEq (has no administration in time range)  stroke: mapping our early stages of recovery book (has no administration in time range)  acetaminophen (TYLENOL) tablet 650 mg (has no administration in time range)    Or  acetaminophen (TYLENOL) solution 650 mg (has no administration in time range)    Or  acetaminophen (TYLENOL) suppository 650 mg (has no administration in time range)  enoxaparin (LOVENOX) injection 40 mg (has no administration in time range)  cloNIDine (CATAPRES) tablet 0.1 mg (has no administration in time range)  cloNIDine (CATAPRES) tablet 0.3 mg (has no administration in time range)    Mobility walks Moderate fall risk   Focused Assessments Neuro Assessment Handoff:  Swallow screen pass? Yes    NIH Stroke Scale ( + Modified Stroke Scale Criteria)  Level of Consciousness (1a.)   : Alert, keenly  responsive LOC Questions (1b. )   +: Answers both questions correctly LOC Commands (1c. )   + : Performs both tasks correctly Best Gaze (2. )  +: Normal Visual (3. )  +: No visual loss Facial Palsy (4. )    : Normal symmetrical movements Motor Arm, Left (5a. )   +: No drift Motor Arm, Right (5b. )   +: No drift Motor Leg, Left (6a. )   +: No drift Motor Leg, Right (6b. )   +: No drift Limb Ataxia (7. ): Absent Sensory (8. )   +: Normal, no sensory loss Best Language (9. )   +: No aphasia Dysarthria (10. ): Normal Extinction/Inattention (11.)   +: No Abnormality Modified SS Total  +: 0 Complete NIHSS TOTAL: 2 Last date known well: 05/05/19 Last time known well: 1530 Neuro Assessment:   Neuro Checks:      Last Documented NIHSS Modified Score: 0 (05/05/19 1848) Has TPA been given? No If patient is a Neuro Trauma and patient is going to OR before floor call report to [place] nurse: [phone] or [phone]     R Recommendations: See Admitting Provider Note  Report given to:   Additional Notes:

## 2019-05-05 NOTE — H&P (Signed)
History and Physical    RICHARD HOLZ GLO:[id number] DOB: [birth year] DOA: 05/05/2019  PCP: Lilian Coma., MD  Patient coming from: Home  I have personally briefly reviewed patient's old medical records in [place]  Chief Complaint: Stroke  HPI: Sherry Fitzpatrick is a 68 y.o. female with medical history significant of malignant hypertension (2 admissions for hypertensive urgency last year), grade 2 diastolic dysfunction, prior stroke with no residual deficits.  Patient presents to the ED with acute onset of R face, R arm, R leg numbness and weakness.  Symptoms onset around 3:30 PM.  EMS called: BGL nl, BP elevated.  Brought to ED as code stroke.  No fever, no chills, no cough, no sick contacts.   ED Course: NIHSS 2 up through 8pm when window for TPA closed.  Neuro has seen her as code stroke.  She reports ongoing significant RLE weakness / numbness, though her RUE has improved she reports.   Review of Systems: As per HPI, otherwise all review of systems negative.  Past Medical History:  Diagnosis Date  . Alterations of sensations, late effect of cerebrovascular disease(438.6)   . Arthritis    "all over"  . Backache, unspecified   . Body mass index 40.0-44.9, adult ([place])   . Carpal tunnel syndrome   . Dizziness and giddiness   . Esophageal reflux   . Generalized pain   . Headache    "had alot til I had pituitary tumor removed"  . Heart murmur   . Other abnormal glucose   . Other and unspecified hyperlipidemia   . Other malaise and fatigue   . Other postprocedural status(V45.89)   . Other symptoms involving cardiovascular system   . Sleep apnea    "did test; they wanted me to get a CPAP but I never did get it" (04/14/2015)  . Stroke ([place]) 06/2003; 02/2010   "minor; minor" ; denies residual on 04/14/2015  . Unspecified disorder of the pituitary gland and its hypothalamic control   . Unspecified essential hypertension   . Unspecified visual disturbance      Past Surgical History:  Procedure Laterality Date  . CARPAL TUNNEL RELEASE Left ~ 2014  . JOINT REPLACEMENT    . TOTAL HIP ARTHROPLASTY Right 11/06/2010  . TRANSPHENOIDAL / TRANSNASAL HYPOPHYSECTOMY / RESECTION PITUITARY TUMOR  06/13/2012  . TUBAL LIGATION  1974     reports that she has been smoking cigarettes. She has never used smokeless tobacco. She reports current alcohol use. She reports that she does not use drugs.  Allergies  Allergen Reactions  . Morphine And Related Hives and Nausea And Vomiting  . Penicillins Hives    Has patient had a PCN reaction causing immediate rash, facial/tongue/throat swelling, SOB or lightheadedness with hypotension: Yes Has patient had a PCN reaction causing severe rash involving mucus membranes or skin necrosis: No Has patient had a PCN reaction that required hospitalization: No Has patient had a PCN reaction occurring within the last 10 years: No If all of the above answers are "NO", then may proceed with Cephalosporin use.  Marland Kitchen Percocet [Oxycodone-Acetaminophen] Nausea And Vomiting  . Ace Inhibitors Nausea And Vomiting and Cough  . Iodinated Diagnostic Agents Nausea And Vomiting       . Iodine Itching  . Etodolac Nausea And Vomiting  . Tramadol Nausea And Vomiting  . Vicodin [Hydrocodone-Acetaminophen] Nausea And Vomiting    Family History  Problem Relation Age of Onset  . Heart failure Mother   . Hypertension  Mother   . Diabetes Father   . Hypertension Sister   . Heart failure Sister   . Hypertension Daughter      Prior to Admission medications   Medication Sig Start Date End Date Taking? Authorizing Provider  acetaminophen (TYLENOL 8 HOUR ARTHRITIS PAIN) 650 MG CR tablet Take 1,300 mg by mouth every 8 (eight) hours as needed for pain (or headaches).    Yes [provider]  albuterol (PROVENTIL HFA;VENTOLIN HFA) 108 (90 Base) MCG/ACT inhaler Inhale 2 puffs into the lungs every 6 (six) hours as needed for wheezing. Patient  taking differently: Inhale 2 puffs into the lungs every 6 (six) hours as needed for wheezing or shortness of breath.  02/24/18  Yes Danford, Suann Larry, MD  amLODipine (NORVASC) 10 MG tablet Take 1 tablet (10 mg total) by mouth daily. 02/24/18  Yes Danford, Suann Larry, MD  atorvastatin (LIPITOR) 80 MG tablet Take 1 tablet (80 mg total) by mouth daily. Patient taking differently: Take 80 mg by mouth every evening.  02/24/18  Yes Danford, Suann Larry, MD  cloNIDine (CATAPRES) 0.3 MG tablet Take 0.3 mg by mouth 2 (two) times daily. 01/19/19  Yes [provider]  clopidogrel (PLAVIX) 75 MG tablet Take 1 tablet (75 mg total) by mouth daily. 02/24/18  Yes Danford, Suann Larry, MD  dextromethorphan-guaiFENesin (MUCINEX DM) 30-600 MG 12hr tablet Take 1 tablet by mouth 2 (two) times daily. Patient taking differently: Take 1 tablet by mouth 2 (two) times daily as needed for cough.  02/24/18  Yes Danford, Suann Larry, MD  furosemide (LASIX) 40 MG tablet Take 1 tablet (40 mg total) by mouth 2 (two) times daily. Patient taking differently: Take 80 mg by mouth daily.  07/05/18 05/05/19 Yes Shahmehdi, Seyed A, MD  hydrochlorothiazide (HYDRODIURIL) 25 MG tablet Take 25 mg by mouth daily. 01/19/19  Yes [provider]  potassium chloride (K-DUR) 10 MEQ tablet Take 2 tablets (20 mEq total) by mouth daily. Patient taking differently: Take 10 mEq by mouth daily.  07/05/18  Yes Shahmehdi, Seyed A, MD  valsartan (DIOVAN) 160 MG tablet Take 1 tablet (160 mg total) by mouth daily. 02/24/18  Yes Edwin Dada, MD    Physical Exam: Vitals:   05/05/19 1815 05/05/19 1823 05/05/19 1830 05/05/19 1845  BP: (!) 194/97 (!) 146/88 (!) 178/81 (!) 160/80  Pulse: (!) 58 (!) 57 (!) 57 (!) 55  Resp: (!) 21 18 (!) 21 (!) 21  Temp:      TempSrc:      SpO2: 98% 99% 99% 99%  Weight:        Constitutional: NAD, calm, comfortable Eyes: PERRL, lids and conjunctivae normal ENMT: Mucous membranes are moist.  Posterior pharynx clear of any exudate or lesions.Normal dentition.  Neck: normal, supple, no masses, no thyromegaly Respiratory: clear to auscultation bilaterally, no wheezing, no crackles. Normal respiratory effort. No accessory muscle use.  Cardiovascular: Regular rate and rhythm, no murmurs / rubs / gallops. No extremity edema. 2+ pedal pulses. No carotid bruits.  Abdomen: no tenderness, no masses palpated. No hepatosplenomegaly. Bowel sounds positive.  Musculoskeletal: no clubbing / cyanosis. No joint deformity upper and lower extremities. Good ROM, no contractures. Normal muscle tone.  Skin: no rashes, lesions, ulcers. No induration Neurologic: Decreased sensation RLE, 4/5 strength in RLE, 5/5 on left. Psychiatric: Normal judgment and insight. Alert and oriented x 3. Normal mood.    Labs on Admission: I have personally reviewed following labs and imaging studies  CBC: Recent Labs  Lab 05/05/19 1638 05/05/19 1642  WBC 7.9  --   NEUTROABS 3.9  --   HGB 12.6 12.9  HCT 38.4 38.0  MCV 87.9  --   PLT 301  --    Basic Metabolic Panel: Recent Labs  Lab 05/05/19 1638 05/05/19 1642  NA 139 141  K 3.0* 3.1*  CL 103 102  CO2 25  --   GLUCOSE 118* 113*  BUN 19 21  CREATININE 1.21* 1.20*  CALCIUM 8.9  --    GFR: CrCl cannot be calculated (Unknown ideal weight.). Liver Function Tests: Recent Labs  Lab 05/05/19 1638  AST 19  ALT 16  ALKPHOS 65  BILITOT 0.2*  PROT 7.3  ALBUMIN 3.4*   No results for input(s): LIPASE, AMYLASE in the last 168 hours. No results for input(s): AMMONIA in the last 168 hours. Coagulation Profile: Recent Labs  Lab 05/05/19 1638  INR 1.0   Cardiac Enzymes: No results for input(s): CKTOTAL, CKMB, CKMBINDEX, TROPONINI in the last 168 hours. BNP (last 3 results) No results for input(s): PROBNP in the last 8760 hours. HbA1C: No results for input(s): HGBA1C in the last 72 hours. CBG: Recent Labs  Lab 05/05/19 1709  GLUCAP 106*   Lipid  Profile: No results for input(s): CHOL, HDL, LDLCALC, TRIG, CHOLHDL, LDLDIRECT in the last 72 hours. Thyroid Function Tests: No results for input(s): TSH, T4TOTAL, FREET4, T3FREE, THYROIDAB in the last 72 hours. Anemia Panel: No results for input(s): VITAMINB12, FOLATE, FERRITIN, TIBC, IRON, RETICCTPCT in the last 72 hours. Urine analysis:    Component Value Date/Time   COLORURINE YELLOW 07/05/2018 [place] 07/05/2018 1241   LABSPEC 1.012 07/05/2018 1241   PHURINE 8.0 07/05/2018 1241   GLUCOSEU NEGATIVE 07/05/2018 1241   HGBUR NEGATIVE 07/05/2018 1241   BILIRUBINUR NEGATIVE 07/05/2018 1241   KETONESUR NEGATIVE 07/05/2018 1241   PROTEINUR NEGATIVE 07/05/2018 1241   UROBILINOGEN 0.2 04/14/2015 1451   NITRITE NEGATIVE 07/05/2018 1241   LEUKOCYTESUR NEGATIVE 07/05/2018 1241    Radiological Exams on Admission: Ct Head Code Stroke Wo Contrast  Result Date: 05/05/2019 CLINICAL DATA:  Code stroke. Right-sided weakness and facial droop. Last seen normal 1530 hours EXAM: CT HEAD WITHOUT CONTRAST TECHNIQUE: Contiguous axial images were obtained from the base of the skull through the vertex without intravenous contrast. COMPARISON:  01/10/2017 FINDINGS: Brain: No evidence of acute cortical or large vessel territory infarction. Areas of low-density in the basal ganglia and internal capsule regions presumed represent old small vessel infarctions. No mass lesion, hemorrhage, hydrocephalus or extra-axial collection. Vascular: There is atherosclerotic calcification of the major vessels at the base of the brain. Skull: Negative Sinuses/Orbits: Clear/normal Other: None ASPECTS ([place] Stroke Program Early CT Score) - Ganglionic level infarction (caudate, lentiform nuclei, internal capsule, insula, M1-M3 cortex): 7 - Supraganglionic infarction (M4-M6 cortex): 3 Total score (0-10 with 10 being normal): 10 IMPRESSION: 1. No acute finding suspected by CT. No sign of large vessel insult. Areas of  low-density in the basal ganglia and internal capsule regions consistent with old small vessel infarctions. 2. ASPECTS is 10 3. These results were communicated to Dr. Lorraine Lax at 4:52 pmon 7/21/2020by text page via the [facility] messaging system. Electronically Signed   By: Nelson Chimes M.D.   On: 05/05/2019 16:54    EKG: Independently reviewed.  Assessment/Plan Principal Problem:   Acute ischemic stroke [facility]) Active Problems:   Malignant hypertension   Chronic diastolic CHF (congestive heart failure) ([place])    1. Acute ischemic stroke -  1. Stroke pathway and workup 2. MRI brain 3. PT/OT/SLP 4. CXR 5. 2d echo 6. Carotid dopplers 7. Neuro consult 8. Continue plavix for the moment till neuro says otherwise 9. See below for discussion regarding permissive HTN which will be challenging in this patient and likely to require 48h of admission time (at least). 2. Malignant HTN - 1. Patient obviously needs some permissive hypertension in setting of acute stroke with ongoing deficits. 2. However, full stop on the patients BP meds seems unlikely to work given h/o malignant HTN, 2 admissions past year for HTN urgency, diastolic CHF, and rebound effect of clonidine. 3. Thus for tonight will try: 1. Hold evening clonidine dose 2. Dose Clonidine 0.1mg  x1 overnight when or if her SBP gets above 800 systolic 3. If that doesn't work and SBP gets above 220 or patient develops CP/SOB, call for additional meds 4. Tomorrow morning: 1. Resume clonidine at 0.3mg  PO BID 2. Hold home Valsartan 3. Hold home HCTZ 4. Hold home Norvasc 5. Continue once daily Lasix 6. Adjust meds as needed / depending on BP response. 5. Goal BP should be about 349 systolic I think (Definitely wont tolerate 230s based on last years admits, hopefully can tolerate 200-220) 3. Chronic diastolic CHF - 1. Continue daily lasix and KDUR 10 2. Replace K now 3. BP management as above  DVT prophylaxis: Lovenox Code Status: Full Family  Communication: No family in room Disposition Plan: Home after admit Consults called: Neuro Admission status: Admit to inpatient  Severity of Illness: The appropriate patient status for this patient is INPATIENT. Inpatient status is judged to be reasonable and necessary in order to provide the required intensity of service to ensure the patient's safety. The patient's presenting symptoms, physical exam findings, and initial radiographic and laboratory data in the context of their chronic comorbidities is felt to place them at high risk for further clinical deterioration. Furthermore, it is not anticipated that the patient will be medically stable for discharge from the hospital within 2 midnights of admission. The following factors support the patient status of inpatient.   IP status for acute stroke.  Challenging management of permissive hypertension in patient with h/o hypertensive urgency admits for malignant hypertension.  (Attempting to do permissive HTN at home seems highly inadvisable in this patient given her history.)   * I certify that at the point of admission it is my clinical judgment that the patient will require inpatient hospital care spanning beyond 2 midnights from the point of admission due to high intensity of service, high risk for further deterioration and high frequency of surveillance required.*    Anatalia Kronk M. DO [facility]  How to contact the [facility] Attending or Consulting provider [place] or covering provider during after hours [place], for this patient?  1. Check the care team in [facility] and look for a) attending/consulting TRH provider listed and b) the [facility] team listed 2. Log into [email or web address]  Amion Physician Scheduling and messaging for groups and whole hospitals  On call and physician scheduling software for group practices, residents, hospitalists and other medical providers for call, clinic, rotation and shift schedules. OnCall Enterprise is a  hospital-wide system for scheduling doctors and paging doctors on call. EasyPlot is for scientific plotting and data analysis.  [email or web address]  and use [place]'s universal password to access. If you do not have the password, please contact the hospital operator.  3. Locate the [facility] provider you are looking for under [facility] and  page to a number that you can be directly reached. 4. If you still have difficulty reaching the provider, please page the [facility] (Director on Call) for the Hospitalists listed on amion for assistance.  05/05/2019, 8:02 PM

## 2019-05-05 NOTE — ED Triage Notes (Signed)
Pt here from home as a code stroke with lsn at 1530 pt with right side weakness and numbness to arm and leg ,

## 2019-05-06 ENCOUNTER — Inpatient Hospital Stay (HOSPITAL_COMMUNITY): Payer: Medicare HMO

## 2019-05-06 DIAGNOSIS — I371 Nonrheumatic pulmonary valve insufficiency: Secondary | ICD-10-CM

## 2019-05-06 DIAGNOSIS — I340 Nonrheumatic mitral (valve) insufficiency: Secondary | ICD-10-CM

## 2019-05-06 DIAGNOSIS — I639 Cerebral infarction, unspecified: Secondary | ICD-10-CM

## 2019-05-06 LAB — BASIC METABOLIC PANEL
Anion gap: 9 (ref 5–15)
BUN: 18 mg/dL (ref 8–23)
CO2: 29 mmol/L (ref 22–32)
Calcium: 8.7 mg/dL — ABNORMAL LOW (ref 8.9–10.3)
Chloride: 103 mmol/L (ref 98–111)
Creatinine, Ser: 1.26 mg/dL — ABNORMAL HIGH (ref 0.44–1.00)
GFR calc Af Amer: 50 mL/min — ABNORMAL LOW (ref >60)
GFR calc non Af Amer: 43 mL/min — ABNORMAL LOW (ref >60)
Glucose, Bld: 127 mg/dL — ABNORMAL HIGH (ref 70–99)
Potassium: 3.3 mmol/L — ABNORMAL LOW (ref 3.5–5.1)
Sodium: 141 mmol/L (ref 135–145)

## 2019-05-06 LAB — LIPID PANEL
Cholesterol: 165 mg/dL (ref 0–200)
HDL: 34 mg/dL — ABNORMAL LOW (ref >40)
LDL Cholesterol: 104 mg/dL — ABNORMAL HIGH (ref 0–99)
Total CHOL/HDL Ratio: 4.9 RATIO
Triglycerides: 136 mg/dL (ref <150)
VLDL: 27 mg/dL (ref 0–40)

## 2019-05-06 LAB — HEMOGLOBIN A1C
Hgb A1c MFr Bld: 6.9 % — ABNORMAL HIGH (ref 4.8–5.6)
Mean Plasma Glucose: 151.33 mg/dL

## 2019-05-06 LAB — ECHOCARDIOGRAM COMPLETE
Height: 63 in
Weight: 3992.97 oz

## 2019-05-06 MED ORDER — POTASSIUM CHLORIDE CRYS ER 20 MEQ PO TBCR
40.0000 meq | EXTENDED_RELEASE_TABLET | Freq: Once | ORAL | Status: AC
  Administered 2019-05-06: 40 meq via ORAL
  Filled 2019-05-06: qty 2

## 2019-05-06 MED ORDER — HYDROCHLOROTHIAZIDE 25 MG PO TABS: 25.0000 mg | ORAL_TABLET | Freq: Every day | ORAL | Status: DC

## 2019-05-06 MED ORDER — AMLODIPINE BESYLATE 10 MG PO TABS
10.0000 mg | ORAL_TABLET | Freq: Every day | ORAL | Status: DC
  Administered 2019-05-06 – 2019-05-07 (×2): 10 mg via ORAL
  Filled 2019-05-06 (×2): qty 1

## 2019-05-06 MED ORDER — IRBESARTAN 150 MG PO TABS
150.0000 mg | ORAL_TABLET | Freq: Every day | ORAL | Status: DC
  Administered 2019-05-06 – 2019-05-07 (×2): 150 mg via ORAL
  Filled 2019-05-06 (×2): qty 1

## 2019-05-06 NOTE — TOC Transition Note (Signed)
Transition of Care [facility]) - CM/SW Discharge Note   Patient Details  Name: Sherry Fitzpatrick MRN: [id number] Date of Birth: 09-09-1951  [facility]) CM/SW Contact:  Carles Collet, RN Phone Number: 05/06/2019, 4:28 PM   Clinical Narrative:   Spoke w patient at bedside, she would like to use [facility] for [facility] services, she has a family who works for them. She lives at home w her daughter and her family. She has RW at home and would like t have a 3/1. Order placed and requested to be brought to room. Patient states that her daughter will be able to transport her home.  Referral accepted by Durene Fruits [facility]    Final next level of care: [place] Barriers to Discharge: Continued Medical Work up   Patient Goals and CMS Choice Patient states their goals for this hospitalization and ongoing recovery are:: to return home CMS Medicare.gov Compare Post Acute Care list provided to:: Patient Choice offered to / list presented to : Patient  Discharge Placement                       Discharge Plan and Services   Discharge Planning Services: CM Consult            DME Arranged: 3-N-1 DME Agency: [employer] Date DME Agency Contacted: 05/06/19 Time DME Agency Contacted: 1600 Representative spoke with at DME Agency: zack HH Arranged: PT, OT [place] Agency: [place] ([place]) Date [place]: 05/06/19 Time [place]: 1600 Representative spoke with at [place]: [place] ([place]) Interventions     Readmission Risk Interventions No flowsheet data found.

## 2019-05-06 NOTE — Progress Notes (Signed)
  Echocardiogram 2D Echocardiogram has been performed.  Meika Earll G Salvatore Shear 05/06/2019, 11:23 AM

## 2019-05-06 NOTE — Progress Notes (Signed)
Carotid duplex  has been completed. Refer to [facility] under chart review to view preliminary results.   05/06/2019  12:23 PM Sherry Fitzpatrick, Bonnye Fava

## 2019-05-06 NOTE — Evaluation (Signed)
Speech Language Pathology Evaluation Patient Details Name: Sherry Fitzpatrick MRN: [id number] DOB: 01/27/1951 Today's Date: 05/06/2019 Time: 1025-8527 SLP Time Calculation (min) (ACUTE ONLY): 19 min  Problem List:  Patient Active Problem List   Diagnosis Date Noted  . Acute ischemic stroke ([place]) 05/05/2019  . AKI (acute kidney injury) ([place]) 07/03/2018  . Hypertensive crisis 07/01/2018  . Tobacco abuse 07/01/2018  . Hypokalemia 07/01/2018  . Chronic diastolic CHF (congestive heart failure) ([place]) 07/01/2018  . Positive D dimer   . Hypertensive urgency 02/22/2018  . Malignant hypertension 02/20/2018  . Shortness of breath 02/20/2018  . Stroke ([place])   . Chest pain 04/14/2015  . Right sided weakness 04/14/2015  . Hyperlipidemia   . Body mass index 40.0-44.9, adult ([place])   . Essential hypertension   . Blood glucose elevated   . Asthma   . OSA (obstructive sleep apnea) 05/06/2013   Past Medical History:  Past Medical History:  Diagnosis Date  . Alterations of sensations, late effect of cerebrovascular disease(438.6)   . Arthritis    "all over"  . Backache, unspecified   . Body mass index 40.0-44.9, adult ([place])   . Carpal tunnel syndrome   . Dizziness and giddiness   . Esophageal reflux   . Generalized pain   . Headache    "had alot til I had pituitary tumor removed"  . Heart murmur   . Other abnormal glucose   . Other and unspecified hyperlipidemia   . Other malaise and fatigue   . Other postprocedural status(V45.89)   . Other symptoms involving cardiovascular system   . Sleep apnea    "did test; they wanted me to get a CPAP but I never did get it" (04/14/2015)  . Stroke ([place]) 06/2003; 02/2010   "minor; minor" ; denies residual on 04/14/2015  . Unspecified disorder of the pituitary gland and its hypothalamic control   . Unspecified essential hypertension   . Unspecified visual disturbance    Past Surgical History:  Past Surgical History:  Procedure Laterality Date  .  CARPAL TUNNEL RELEASE Left ~ 2014  . JOINT REPLACEMENT    . TOTAL HIP ARTHROPLASTY Right 11/06/2010  . TRANSPHENOIDAL / TRANSNASAL HYPOPHYSECTOMY / RESECTION PITUITARY TUMOR  06/13/2012  . TUBAL LIGATION  1974   HPI:  Sherry Fitzpatrick is a 68 y.o. female with medical history significant of malignant hypertension (2 admissions for hypertensive urgency last year), grade 2 diastolic dysfunction, prior stroke with no residual deficits who presented with R face, R arm, R leg numbness and weakness. MRI of the brain showed chronic ischemic microangiopathy without acute intracranial abnormality.   Assessment / Plan / Recommendation Clinical Impression  Sherry Fitzpatrick reported that she was living with her daughter prior to admission. She denied any baseline or new deficits in speech, language or cognition. Her speech and language skills are currently within normal limits and no overt cognitive deficits were noted. Further skilled SLP services are not clinically indicated at this time. Sherry Fitzpatrick and nursing were educated regarding this and both parties verbalized understanding as well as agreement with plan of care.    SLP Assessment  SLP Recommendation/Assessment: Patient does not need any further Speech Lanaguage Pathology Services SLP Visit Diagnosis: Aphasia (R47.01)    Follow Up Recommendations  None    Frequency and Duration           SLP Evaluation Cognition  Overall Cognitive Status: Within Functional Limits for tasks assessed Arousal/Alertness: Awake/alert Orientation Level: Oriented X4 Attention: Focused;Sustained Focused Attention: Appears intact  Sustained Attention: Appears intact Memory: Impaired Memory Impairment: Storage deficit;Retrieval deficit;Decreased recall of new information(Immediate: 3/3; delayed: 2/3; with cue: 1/1) Awareness: Appears intact Problem Solving: Appears intact(5/5)       Comprehension  Auditory Comprehension Overall Auditory Comprehension: Appears within functional limits for tasks  assessed Yes/No Questions: Within Functional Limits Basic Biographical Questions: (5/5) Complex Questions: (5/5) Paragraph Comprehension (via yes/no questions): (3/4) Commands: Within Functional Limits Two Step Basic Commands: (4/4) Multistep Basic Commands: (4/4) Conversation: Complex    Expression Expression Primary Mode of Expression: Verbal Verbal Expression Overall Verbal Expression: Appears within functional limits for tasks assessed Initiation: No impairment Level of Generative/Spontaneous Verbalization: Conversation Repetition: No impairment(5/5) Naming: No impairment Responsive: (5/5) Convergent: (5/5) Pragmatics: No impairment Written Expression Dominant Hand: Right   Oral / Motor  Oral Motor/Sensory Function Overall Oral Motor/Sensory Function: Within functional limits Motor Speech Overall Motor Speech: Appears within functional limits for tasks assessed Respiration: Within functional limits Phonation: Normal Resonance: Within functional limits Articulation: Within functional limitis Intelligibility: Intelligible Motor Planning: Witnin functional limits   Emine Lopata I. Hardin Negus, [place], [place] Office number [phone] Pager [place] 05/06/2019, 1:54 PM

## 2019-05-06 NOTE — Evaluation (Signed)
Occupational Therapy Evaluation Patient Details Name: Sherry Fitzpatrick MRN: [id number] DOB: 27-Jun-1951 Today's Date: 05/06/2019    History of Present Illness Patient is a 68 y/o female presenting with Right side parasthesia, pain and weakness on 05/05/2019. CT head was unremarkable. MRI with Chronic ischemic microangiopathy without acute intracranial abnormality. Past medical history significant for uncontrolled hypertension, CHF with grade 2 diastolic dysfunction, prior stroke with no residual deficits, sleep apnea, obesity. Admitted for TIA versus hypertensive crisis with continued work-up.   Clinical Impression   Pt PTA: living with daughter and performs ADL and IADL with little to no assist. Pt reported driving to get groceries. Pt currently ambulating with no AD and performing ADL with supervisionA and increased time. Pt with fair activity tolerance, reporting SOB, but O2 98%. Pt ambulating in room with RW for stability with minguardA and minA for power up. Pt reporting no pain, numbness or tingling.  Pt would greatly benefit from continued OT for ADL tasks and energy conservation. OT following acutely.  Pt has low commodes and would benefit from 3in1 to double as shower chair for fatigue and new weakness/fatigue.    Follow Up Recommendations  Home health OT    Equipment Recommendations  3 in 1 bedside commode;Tub/shower seat    Recommendations for Other Services       Precautions / Restrictions Precautions Precautions: Fall Restrictions Weight Bearing Restrictions: No      Mobility Bed Mobility Overal bed mobility: Needs Assistance Bed Mobility: Supine to Sit     Supine to sit: Supervision     General bed mobility comments: increased time and effort  Transfers Overall transfer level: Needs assistance Equipment used: Rolling walker (2 wheeled) Transfers: Sit to/from Stand Sit to Stand: Min assist         General transfer comment: Min A to power up from bedside;  cueing for posturing; mild LOB with initial stand    Balance Overall balance assessment: Mild deficits observed, not formally tested                                         ADL either performed or assessed with clinical judgement   ADL Overall ADL's : At baseline                                       General ADL Comments: Pt reprots SOB with activity today, HR 73 adn O2 sats 98%. Pt reports sedentary lifestyle, but does her own ADL and IADL. Pt requirign increased time, but performing LB dressing with figure 4 technique and supervisionA for grooming at sink.     Vision Baseline Vision/History: No visual deficits Vision Assessment?: No apparent visual deficits     Perception     Praxis      Pertinent Vitals/Pain Pain Assessment: No/denies pain     Hand Dominance Right   Extremity/Trunk Assessment Upper Extremity Assessment Upper Extremity Assessment: Overall WFL for tasks assessed   Lower Extremity Assessment Lower Extremity Assessment: Generalized weakness   Cervical / Trunk Assessment Cervical / Trunk Assessment: Normal   Communication Communication Communication: No difficulties   Cognition Arousal/Alertness: Awake/alert Behavior During Therapy: WFL for tasks assessed/performed Overall Cognitive Status: Within Functional Limits for tasks assessed  General Comments  Pt reports SOB and feeling tired after being in bed x2 days. Pt with no focal deficits regarding numbness/tingling..    Exercises     Shoulder Instructions      Home Living Family/patient expects to be discharged to:: Private residence Living Arrangements: Children Available Help at Discharge: Family;Available PRN/intermittently Type of Home: House Home Access: Level entry     Home Layout: Multi-level Alternate Level Stairs-Number of Steps: 2 Alternate Level Stairs-Rails: None Bathroom Shower/Tub:  Teacher, early years/pre: Standard Bathroom Accessibility: Yes   Home Equipment: Walker - 2 wheels          Prior Functioning/Environment Level of Independence: Independent                 OT Problem List: Decreased activity tolerance;Decreased strength      OT Treatment/Interventions: Self-care/ADL training;Therapeutic exercise;Neuromuscular education;Energy conservation;Therapeutic activities;Patient/family education;Balance training    OT Goals(Current goals can be found in the care plan section) Acute Rehab OT Goals Patient Stated Goal: improve strength OT Goal Formulation: With patient Time For Goal Achievement: 05/20/19 Potential to Achieve Goals: Good ADL Goals Pt Will Perform Lower Body Dressing: with modified independence;sit to/from stand Pt Will Transfer to Toilet: with modified independence;stand pivot transfer Pt Will Perform Toileting - Clothing Manipulation and hygiene: with modified independence;sit to/from stand Additional ADL Goal #1: Pt will be modified independent for OOB ADL with good balance.  OT Frequency: Min 2X/week   Barriers to D/C:            Co-evaluation PT/OT/SLP Co-Evaluation/Treatment: Yes Reason for Co-Treatment: For patient/therapist safety PT goals addressed during session: Mobility/safety with mobility;Balance;Proper use of DME;Strengthening/ROM OT goals addressed during session: ADL's and self-care      AM-PAC OT "6 Clicks" Daily Activity     Outcome Measure Help from another person eating meals?: None Help from another person taking care of personal grooming?: None Help from another person toileting, which includes using toliet, bedpan, or urinal?: A Little Help from another person bathing (including washing, rinsing, drying)?: A Little Help from another person to put on and taking off regular upper body clothing?: None Help from another person to put on and taking off regular lower body clothing?: A Little 6  Click Score: 21   End of Session Equipment Utilized During Treatment: Gait belt;Rolling walker Nurse Communication: Mobility status  Activity Tolerance: Patient tolerated treatment well Patient left: in chair;with call bell/phone within reach;with chair alarm set  OT Visit Diagnosis: Unsteadiness on feet (R26.81);Muscle weakness (generalized) (M62.81) Pain - Right/Left: Right                Time: 5462-7035 OT Time Calculation (min): 23 min Charges:  OT General Charges $OT Visit: 1 Visit OT Evaluation $OT Eval Moderate Complexity: 1 Mod  Darryl Nestle) Marsa Aris OTR/L Acute Rehabilitation Services Pager: [phone] Office: [phone]   [id number] W CHENI 05/06/2019, 11:13 AM

## 2019-05-06 NOTE — Progress Notes (Signed)
PROGRESS NOTE    Sherry Fitzpatrick  HAL:[id number] DOB: [birth year] DOA: 05/05/2019 PCP: Lilian Coma., MD   Brief Narrative:  Patient is a 68 year old female with history of mild hypertension, grade 2 diastolic dysfunction, strokes x2 with no residual deficits who presents to the emergency department with complaints of acute onset of right face, right arm, right leg numbness and weakness.  Code stroke was called in the emergency department.  Neurology consulted.  Patient admitted for further work-up.  MRI did not show any stroke.  Assessment & Plan:   Principal Problem:   Acute ischemic stroke [facility]) Active Problems:   Malignant hypertension   Chronic diastolic CHF (congestive heart failure) ([place])   Suspected CVA/likely TIA: Presented with right-sided numbness, right lower extremity weakness.MRI showed Chronic ischemic microangiopathy without acute intracranial abnormality.  Neurology was following. This morning she does not have any of those symptoms. Patient seen by physical therapy and occupational therapy and recommended home health on discharge. Underwent echocardiogram which showed ejection fraction of 55 to 60%, no wall motion abnormality, no source for embolus. Carotid Dopplers did not show any significant stenosis. Neurology following.  Malignant hypertension: Noted to be severely hypertensive this morning.  We restarted her home meds.  We will continue to monitor on telemetry.  Chronic diastolic CHF: Currently euvolemic.  On Lasix at home.  CKD stage III: Currently kidney function is at baseline.  On reviewing her previous records her baseline creatinine is around 1.3.  Hypokalemia: Supplemented with potassium.  Patient needs to be monitored closely for her severe hypertension.  Not stable for discharge today.  Patient needs follow-up with PCP as an outpatient.  Currently she does not have a PCP.  I have requested case manager to arrange for follow-up with PCP           DVT prophylaxis: Lovenox Code Status: Full Family Communication: None Disposition Plan: Home with home health tomorrow   Consultants: Neurology  Procedures: MRI  Antimicrobials:  Anti-infectives (From admission, onward)   None      Subjective:  Patient seen and examined the bedside this morning.  Hypertensive.  Complains of generalized weakness.  Does not feel well today .  She does not have any focal neurological deficits. Objective: Vitals:   05/06/19 0315 05/06/19 0513 05/06/19 0700 05/06/19 1300  BP: (!) 170/74 (!) 172/69 (!) 187/78 (!) 163/83  Pulse: (!) 57 (!) 58 (!) 54 (!) 56  Resp: 18 18 18 18   Temp: 98.2 F (36.8 C) 98.4 F (36.9 C) 98.2 F (36.8 C) 98.4 F (36.9 C)  TempSrc: Oral Oral Oral Oral  SpO2: 97% 100% 98% 100%  Weight:      Height:        Intake/Output Summary (Last 24 hours) at 05/06/2019 1343 Last data filed at 05/06/2019 0315 Gross per 24 hour  Intake --  Output 300 ml  Net -300 ml   Filed Weights   05/05/19 1709 05/05/19 2201  Weight: 113.9 kg 113.2 kg    Examination:  General exam: Not in distress, morbidly obese  HEENT:PERRL,Oral mucosa moist, Ear/Nose normal on gross exam Respiratory system: Bilateral equal air entry, normal vesicular breath sounds, no wheezes or crackles  Cardiovascular system: S1 & S2 heard, RRR. No JVD, murmurs, rubs, gallops or clicks. No pedal edema. Gastrointestinal system: Abdomen is nondistended, soft and nontender. No organomegaly or masses felt. Normal bowel sounds heard. Central nervous system: Alert and oriented. No focal neurological deficits. Extremities: No edema, no clubbing ,no cyanosis,  distal peripheral pulses palpable. Skin: No rashes, lesions or ulcers,no icterus ,no pallor MSK: Normal muscle bulk,tone ,power Psychiatry: Judgement and insight appear normal. Mood & affect appropriate.     Data Reviewed: I have personally reviewed following labs and imaging studies  CBC: Recent Labs   Lab 05/05/19 1638 05/05/19 1642  WBC 7.9  --   NEUTROABS 3.9  --   HGB 12.6 12.9  HCT 38.4 38.0  MCV 87.9  --   PLT 301  --    Basic Metabolic Panel: Recent Labs  Lab 05/05/19 1638 05/05/19 1642 05/06/19 0457  NA 139 141 141  K 3.0* 3.1* 3.3*  CL 103 102 103  CO2 25  --  29  GLUCOSE 118* 113* 127*  BUN 19 21 18   CREATININE 1.21* 1.20* 1.26*  CALCIUM 8.9  --  8.7*   GFR: Estimated Creatinine Clearance: 51 mL/min (A) (by C-G formula based on SCr of 1.26 mg/dL (H)). Liver Function Tests: Recent Labs  Lab 05/05/19 1638  AST 19  ALT 16  ALKPHOS 65  BILITOT 0.2*  PROT 7.3  ALBUMIN 3.4*   No results for input(s): LIPASE, AMYLASE in the last 168 hours. No results for input(s): AMMONIA in the last 168 hours. Coagulation Profile: Recent Labs  Lab 05/05/19 1638  INR 1.0   Cardiac Enzymes: No results for input(s): CKTOTAL, CKMB, CKMBINDEX, TROPONINI in the last 168 hours. BNP (last 3 results) No results for input(s): PROBNP in the last 8760 hours. HbA1C: Recent Labs    05/06/19 0457  HGBA1C 6.9*   CBG: Recent Labs  Lab 05/05/19 1709  GLUCAP 106*   Lipid Profile: Recent Labs    05/06/19 0457  CHOL 165  HDL 34*  LDLCALC 104*  TRIG 136  CHOLHDL 4.9   Thyroid Function Tests: No results for input(s): TSH, T4TOTAL, FREET4, T3FREE, THYROIDAB in the last 72 hours. Anemia Panel: No results for input(s): VITAMINB12, FOLATE, FERRITIN, TIBC, IRON, RETICCTPCT in the last 72 hours. Sepsis Labs: No results for input(s): PROCALCITON, LATICACIDVEN in the last 168 hours.  Recent Results (from the past 240 hour(s))  SARS Coronavirus 2 (CEPHEID - Performed in [place] hospital lab), Hosp Order     Status: None   Collection Time: 05/05/19  5:42 PM   Specimen: Nasopharyngeal Swab  Result Value Ref Range Status   SARS Coronavirus 2 NEGATIVE NEGATIVE Final    Comment: (NOTE) If result is NEGATIVE SARS-CoV-2 target nucleic acids are NOT DETECTED. The SARS-CoV-2  RNA is generally detectable in upper and lower  respiratory specimens during the acute phase of infection. The lowest  concentration of SARS-CoV-2 viral copies this assay can detect is 250  copies / mL. A negative result does not preclude SARS-CoV-2 infection  and should not be used as the sole basis for treatment or other  patient management decisions.  A negative result may occur with  improper specimen collection / handling, submission of specimen other  than nasopharyngeal swab, presence of viral mutation(s) within the  areas targeted by this assay, and inadequate number of viral copies  (<250 copies / mL). A negative result must be combined with clinical  observations, patient history, and epidemiological information. If result is POSITIVE SARS-CoV-2 target nucleic acids are DETECTED. The SARS-CoV-2 RNA is generally detectable in upper and lower  respiratory specimens dur ing the acute phase of infection.  Positive  results are indicative of active infection with SARS-CoV-2.  Clinical  correlation with patient history and other diagnostic information  is  necessary to determine patient infection status.  Positive results do  not rule out bacterial infection or co-infection with other viruses. If result is PRESUMPTIVE POSTIVE SARS-CoV-2 nucleic acids MAY BE PRESENT.   A presumptive positive result was obtained on the submitted specimen  and confirmed on repeat testing.  While 2019 novel coronavirus  (SARS-CoV-2) nucleic acids may be present in the submitted sample  additional confirmatory testing may be necessary for epidemiological  and / or clinical management purposes  to differentiate between  SARS-CoV-2 and other Sarbecovirus currently known to infect humans.  If clinically indicated additional testing with an alternate test  methodology [phone]) is advised. The SARS-CoV-2 RNA is generally  detectable in upper and lower respiratory sp ecimens during the acute  phase of  infection. The expected result is Negative. Fact Sheet for Patients:  StrictlyIdeas.no Fact Sheet for Healthcare Providers: BankingDealers.co.za This test is not yet approved or cleared by the [place] FDA and has been authorized for detection and/or diagnosis of SARS-CoV-2 by FDA under an Emergency Use Authorization (EUA).  This EUA will remain in effect (meaning this test can be used) for the duration of the COVID-19 declaration under Section 564(b)(1) of the Act, 21 U.S.C. section 360bbb-3(b)(1), unless the authorization is terminated or revoked sooner. Performed at [facility], [place] [address], [place], [place] [address]          Radiology Studies: Dg Chest 2 View  Result Date: 05/05/2019 CLINICAL DATA:  Right-sided chest pain. Acute ischemic stroke. EXAM: CHEST - 2 VIEW COMPARISON:  07/05/2018 and 07/01/2018 FINDINGS: The heart size and pulmonary vascularity are normal. Lungs are clear. No effusions. No significant bone abnormality. IMPRESSION: No active cardiopulmonary disease. Electronically Signed   By: [name] M.D.   On: 05/05/2019 20:15   Mr Brain Wo Contrast  Result Date: 05/06/2019 CLINICAL DATA:  Focal neurologic deficit. Right-sided numbness and weakness. EXAM: MRI HEAD WITHOUT CONTRAST TECHNIQUE: Multiplanar, multiecho pulse sequences of the brain and surrounding structures were obtained without intravenous contrast. COMPARISON:  Head CT 05/05/2019 FINDINGS: BRAIN: There is no acute infarct, acute hemorrhage or extra-axial collection. The midline structures are normal. There is no midline shift or mass effect. Early confluent hyperintense T2-weighted signal of the periventricular and deep white matter, most commonly due to chronic ischemic microangiopathy. The cerebral and cerebellar volume are age-appropriate. There is no hydrocephalus. Susceptibility-sensitive sequences show no chronic microhemorrhage or  superficial siderosis. VASCULAR: The major intracranial arterial and venous sinus flow voids are normal. SKULL AND UPPER CERVICAL SPINE: Calvarial bone marrow signal is normal. There is no skull base mass. The visualized upper cervical spine and soft tissues are normal. SINUSES/ORBITS: There are no fluid levels or advanced mucosal thickening. The mastoid air cells and middle ear cavities are free of fluid. The orbits are normal. IMPRESSION: Chronic ischemic microangiopathy without acute intracranial abnormality. Electronically Signed   By: Ulyses Jarred M.D.   On: 05/06/2019 00:42   Ct Head Code Stroke Wo Contrast  Result Date: 05/05/2019 CLINICAL DATA:  Code stroke. Right-sided weakness and facial droop. Last seen normal 1530 hours EXAM: CT HEAD WITHOUT CONTRAST TECHNIQUE: Contiguous axial images were obtained from the base of the skull through the vertex without intravenous contrast. COMPARISON:  01/10/2017 FINDINGS: Brain: No evidence of acute cortical or large vessel territory infarction. Areas of low-density in the basal ganglia and internal capsule regions presumed represent old small vessel infarctions. No mass lesion, hemorrhage, hydrocephalus or extra-axial collection. Vascular: There is  atherosclerotic calcification of the major vessels at the base of the brain. Skull: Negative Sinuses/Orbits: Clear/normal Other: None ASPECTS ([place] Stroke Program Early CT Score) - Ganglionic level infarction (caudate, lentiform nuclei, internal capsule, insula, M1-M3 cortex): 7 - Supraganglionic infarction (M4-M6 cortex): 3 Total score (0-10 with 10 being normal): 10 IMPRESSION: 1. No acute finding suspected by CT. No sign of large vessel insult. Areas of low-density in the basal ganglia and internal capsule regions consistent with old small vessel infarctions. 2. ASPECTS is 10 3. These results were communicated to Dr. Lorraine Lax at 4:52 pmon 7/21/2020by text page via the [facility] messaging system. Electronically Signed    By: Nelson Chimes M.D.   On: 05/05/2019 16:54   Vas US Carotid (at [place] Only)  Result Date: 05/06/2019 Carotid Arterial Duplex Study Indications:       CVA and Weakness and numbness on the right side face, arm and                    leg. Risk Factors:      Hypertension. Comparison Study:  Prior study on 04/30/15 showed bilateral 1-39% stenosis. No                    change. Performing Technologist: Oda Cogan RDMS, RVT  Examination Guidelines: A complete evaluation includes B-mode imaging, spectral Doppler, color Doppler, and power Doppler as needed of all accessible portions of each vessel. Bilateral testing is considered an integral part of a complete examination. Limited examinations for reoccurring indications may be performed as noted.  Right Carotid Findings: +----------+--------+--------+--------+--------+--------+             PSV cm/s EDV cm/s Stenosis Describe Comments  +----------+--------+--------+--------+--------+--------+  CCA Prox   103      15                                   +----------+--------+--------+--------+--------+--------+  CCA Distal 52       16                                   +----------+--------+--------+--------+--------+--------+  ICA Prox   68       24       1-39%                       +----------+--------+--------+--------+--------+--------+  ICA Distal 89       26                                   +----------+--------+--------+--------+--------+--------+  ECA        100      10                                   +----------+--------+--------+--------+--------+--------+ +----------+--------+-------+----------------+-------------------+             PSV cm/s EDV cms Describe         Arm Pressure (mmHG)  +----------+--------+-------+----------------+-------------------+  Subclavian 138              Multiphasic, WNL                      +----------+--------+-------+----------------+-------------------+ +---------+--------+--+--------+--+---------+  Vertebral PSV  cm/s 29 EDV cm/s 13 Antegrade  +---------+--------+--+--------+--+---------+  Left Carotid Findings: +----------+--------+--------+--------+--------+--------+             PSV cm/s EDV cm/s Stenosis Describe Comments  +----------+--------+--------+--------+--------+--------+  CCA Prox   65       11                                   +----------+--------+--------+--------+--------+--------+  CCA Distal 47       13                                   +----------+--------+--------+--------+--------+--------+  ICA Prox   50       23       1-39%                       +----------+--------+--------+--------+--------+--------+  ICA Distal 76       33                                   +----------+--------+--------+--------+--------+--------+  ECA        100      15                                   +----------+--------+--------+--------+--------+--------+ +----------+--------+--------+----------------+-------------------+  Subclavian PSV cm/s EDV cm/s Describe         Arm Pressure (mmHG)  +----------+--------+--------+----------------+-------------------+             80                Multiphasic, WNL                      +----------+--------+--------+----------------+-------------------+ +---------+--------+--+--------+--+---------+  Vertebral PSV cm/s 43 EDV cm/s 16 Antegrade  +---------+--------+--+--------+--+---------+  Summary: Right Carotid: Velocities in the right ICA are consistent with a 1-39% stenosis. Left Carotid: Velocities in the left ICA are consistent with a 1-39% stenosis. Vertebrals: Bilateral vertebral arteries demonstrate antegrade flow. *See table(s) above for measurements and observations.     Preliminary         Scheduled Meds:  amLODipine  10 mg Oral Daily   atorvastatin  80 mg Oral QPM   cloNIDine  0.3 mg Oral BID   clopidogrel  75 mg Oral Daily   enoxaparin (LOVENOX) injection  40 mg Subcutaneous Q24H   furosemide  80 mg Oral Daily   irbesartan  150 mg Oral Daily   potassium  chloride  10 mEq Oral Daily   potassium chloride  40 mEq Oral Once   sodium chloride flush  3 mL Intravenous Once   Continuous Infusions:   LOS: 1 day    Time spent: 35 mins.More than 50% of that time was spent in counseling and/or coordination of care.      Shelly Coss, MD [facility] Pager [phone]  If 7PM-7AM, please contact night-coverage [email or web address] Password [facility] 05/06/2019, 1:43 PM

## 2019-05-06 NOTE — Progress Notes (Addendum)
NEUROLOGY PROGRESS NOTE  Subjective: States that she feels approximately 50% better.  Still having decreased sensation on the right side.  Only arm and leg.  Exam: Vitals:   05/06/19 0513 05/06/19 0700  BP: (!) 172/69 (!) 187/78  Pulse: (!) 58 (!) 54  Resp: 18 18  Temp: 98.4 F (36.9 C) 98.2 F (36.8 C)  SpO2: 100% 98%    Physical Exam   HEENT-  Normocephalic, no lesions, without obvious abnormality.  Normal external eye and conjunctiva.   Extremities- Warm, dry and intact Musculoskeletal-no joint tenderness, deformity or swelling Skin-warm and dry, no hyperpigmentation, vitiligo, or suspicious lesions    Neuro:  Mental Status: Alert, oriented, thought content appropriate.  Speech fluent without evidence of aphasia.  Able to follow 3 step commands without difficulty. Cranial Nerves: II:  Visual fields grossly normal,  III,IV, VI: ptosis not present, extra-ocular motions intact bilaterally pupils equal, round, reactive to light and accommodation V,VII: smile symmetric, facial light touch sensation normal bilaterally VIII: hearing normal bilaterally IX,X: Palate rises midline XI: bilateral shoulder shrug XII: midline tongue extension Motor: Right : Upper extremity   5/5    Left:     Upper extremity   5/5  Lower extremity   5/5     Lower extremity   5/5 Tone and bulk:normal tone throughout; no atrophy noted Sensory: Decreased sensation on right arm and leg Deep Tendon Reflexes: 2+ and symmetric throughout Plantars: Right: downgoing   Left: downgoing     Medications:  Scheduled: . atorvastatin  80 mg Oral QPM  . cloNIDine  0.3 mg Oral BID  . clopidogrel  75 mg Oral Daily  . enoxaparin (LOVENOX) injection  40 mg Subcutaneous Q24H  . furosemide  80 mg Oral Daily  . potassium chloride  10 mEq Oral Daily  . sodium chloride flush  3 mL Intravenous Once   Continuous:   Pertinent Labs/Diagnostics: LDL 104 HbA1c 6.9 Echocardiogram pending Carotid Dopplers  pending   Mr Brain Wo Contrast  Result Date: 05/06/2019 CLINICAL DATA:  Focal neurologic deficit. Right-sided numbness and weakness. EXAM: MRI HEAD WITHOUT CONTRAST TECHNIQUE:IMPRESSION: Chronic ischemic microangiopathy without acute intracranial abnormality. Electronically Signed   By: Ulyses Jarred M.D.   On: 05/06/2019 00:42   Ct Head Code Stroke Wo Contrast  Result Date: 05/05/2019 CLINICAL DATA:  Code stroke. Right-sided weakness and facial droop. Last seen normal 1530 hours EXAM: CT HEAD WITHOUT CONTRAST TECHNIQUE:  IMPRESSION: 1. No acute finding suspected by CT. No sign of large vessel insult. Areas of low-density in the basal ganglia and internal capsule regions consistent with old small vessel infarctions. 2. ASPECTS is 10 3. These results were communicated to Dr. Lorraine Lax at 4:52 pmon 7/21/2020by text page via the [facility] messaging system. Electronically Signed   By: Nelson Chimes M.D.   On: 05/05/2019 16:54     Etta Quill PA-C [facility] [phone]   Assessment: 68 year old female with poorly controlled blood pressure, multiple admissions for an hypertensive crisis, CVA presenting to hospital with right-sided numbness and weakness.  At this point time feels that she has improved especially in strength.  Still having decreased sensation on the right side.  MRI negative for acute stroke.  Patient continues to have fluctuating blood pressures with last blood pressure systolically in the 130Q.  At this point awaiting carotid Dopplers, echocardiogram.  Impression:   Hypertensive emergency Unlikely to be TIA/MRI negative for stroke  Recommendations ---Blood pressure goal less than 657 systolic - Continue home antiplatelets, statin -  Continue PT/OT  Neurology will be available as needed.  Recommend assisting patient find a new PCP for better compliance of blood pressure given she is at high risk for stroke/hemorrhage with uncontrolled blood pressure.  05/06/2019, 10:11  AM    NEUROHOSPITALIST ADDENDUM Performed a face to face diagnostic evaluation.   I have reviewed the contents of history and physical exam as documented by PA/ARNP/Resident and agree with above documentation.  I have discussed and formulated the above plan as documented. Edits to the note have been made as needed.  Impression: Hypertensive emergency Key exam findings: No longer having numbness/motor weakness Plan: Resume home meds for stroke prevention, improved blood pressure control and close follow-up with PCP for compliance.  Neurology will be available as needed.    Karena Addison  MD [facility] [phone]   If 7pm to 7am, please call on call as listed on AMION.

## 2019-05-06 NOTE — Evaluation (Signed)
Physical Therapy Evaluation Patient Details Name: Sherry Fitzpatrick MRN: [id number] DOB: [birth year] Today's Date: 05/06/2019   History of Present Illness  Patient is a 68 y/o female presenting with Right side parasthesia, pain and weakness on 05/05/2019. CT head was unremarkable. MRI with Chronic ischemic microangiopathy without acute intracranial abnormality. Past medical history significant for uncontrolled hypertension, CHF with grade 2 diastolic dysfunction, prior stroke with no residual deficits, sleep apnea, obesity. Admitted for TIA versus hypertensive crisis with continued work-up.    Clinical Impression  Patient admitted with the above. Patient reports independence at baseline where she lives with her daughter who can assist if needed. Patient today requiring close min guard assist for safety with mobility - does report some continued numbness of R LE, with patient able to mobilize with RW for increased stability. Patient reporting general fatigue/SOB limiting mobility with SpO2 98% on RA. Will currently recommend HHPT at discharge to ensure safety in the home environment. PT to follow acutely.     Follow Up Recommendations Home health PT    Equipment Recommendations  3in1 (PT)    Recommendations for Other Services       Precautions / Restrictions Precautions Precautions: Fall Restrictions Weight Bearing Restrictions: No      Mobility  Bed Mobility Overal bed mobility: Needs Assistance Bed Mobility: Supine to Sit     Supine to sit: Supervision     General bed mobility comments: increased time and effort  Transfers Overall transfer level: Needs assistance Equipment used: Rolling walker (2 wheeled) Transfers: Sit to/from Stand Sit to Stand: Min assist         General transfer comment: Min A to power up from bedside; cueing for posturing; mild LOB with initial stand  Ambulation/Gait Ambulation/Gait assistance: Min guard Gait Distance (Feet): 75 Feet Assistive  device: Rolling walker (2 wheeled) Gait Pattern/deviations: Step-through pattern;Decreased stride length;Trunk flexed Gait velocity: decreased   General Gait Details: short step length with patient needing standing rest breaks due to reported fatigue/SOB - SpO2 on RA 98%  Stairs            Wheelchair Mobility    Modified Rankin (Stroke Patients Only) Modified Rankin (Stroke Patients Only) Pre-Morbid Rankin Score: No symptoms Modified Rankin: Moderate disability     Balance Overall balance assessment: Mild deficits observed, not formally tested                                           Pertinent Vitals/Pain Pain Assessment: No/denies pain    Home Living Family/patient expects to be discharged to:: Private residence Living Arrangements: Children Available Help at Discharge: Family;Available PRN/intermittently Type of Home: House Home Access: Level entry     Home Layout: Multi-level Home Equipment: Walker - 2 wheels      Prior Function Level of Independence: Independent               Hand Dominance        Extremity/Trunk Assessment   Upper Extremity Assessment Upper Extremity Assessment: Defer to OT evaluation    Lower Extremity Assessment Lower Extremity Assessment: Generalized weakness    Cervical / Trunk Assessment Cervical / Trunk Assessment: Normal  Communication   Communication: No difficulties  Cognition Arousal/Alertness: Awake/alert Behavior During Therapy: WFL for tasks assessed/performed Overall Cognitive Status: Within Functional Limits for tasks assessed  General Comments      Exercises     Assessment/Plan    PT Assessment Patient needs continued PT services  PT Problem List Decreased strength;Decreased activity tolerance;Decreased balance;Decreased mobility;Decreased coordination;Decreased knowledge of use of DME;Decreased safety awareness       PT  Treatment Interventions DME instruction;Gait training;Functional mobility training;Therapeutic activities;Therapeutic exercise;Balance training;Neuromuscular re-education;Patient/family education    PT Goals (Current goals can be found in the Care Plan section)  Acute Rehab PT Goals Patient Stated Goal: improve strength PT Goal Formulation: With patient Time For Goal Achievement: 05/20/19 Potential to Achieve Goals: Good    Frequency Min 4X/week   Barriers to discharge        Co-evaluation PT/OT/SLP Co-Evaluation/Treatment: Yes Reason for Co-Treatment: For patient/therapist safety;To address functional/ADL transfers PT goals addressed during session: Mobility/safety with mobility;Balance;Proper use of DME;Strengthening/ROM         AM-PAC PT "6 Clicks" Mobility  Outcome Measure Help needed turning from your back to your side while in a flat bed without using bedrails?: A Little Help needed moving from lying on your back to sitting on the side of a flat bed without using bedrails?: A Little Help needed moving to and from a bed to a chair (including a wheelchair)?: A Little Help needed standing up from a chair using your arms (e.g., wheelchair or bedside chair)?: A Little Help needed to walk in hospital room?: A Little Help needed climbing 3-5 steps with a railing? : A Lot 6 Click Score: 17    End of Session Equipment Utilized During Treatment: Gait belt Activity Tolerance: Patient tolerated treatment well Patient left: in chair;with call bell/phone within reach;with chair alarm set Nurse Communication: Mobility status PT Visit Diagnosis: Unsteadiness on feet (R26.81);Other abnormalities of gait and mobility (R26.89);Muscle weakness (generalized) (M62.81)    Time: 1610-9604 PT Time Calculation (min) (ACUTE ONLY): 22 min   Charges:   PT Evaluation $PT Eval Moderate Complexity: 1 Mod           Lanney Gins, PT, DPT Supplemental Physical Therapist 05/06/19 9:56  AM Pager: [phone] Office: [phone]

## 2019-05-06 NOTE — Progress Notes (Signed)
SLP Cancellation Note  Patient Details Name: Sherry Fitzpatrick MRN: [id number] DOB: June 11, 1951   Cancelled treatment:       Reason Eval/Treat Not Completed: Patient at procedure or test/unavailable(Pt having Echo at this time and echo tech reported that she will be seen by vascular after. SLP will follow up.)  Edrees Valent I. Hardin Negus, [place], [place] Office number [phone] Pager [place] 05/06/2019, 10:59 AM

## 2019-05-07 MED ORDER — FUROSEMIDE 40 MG PO TABS: 40.0000 mg | ORAL_TABLET | Freq: Two times a day (BID) | ORAL | 1 refills | Status: DC

## 2019-05-07 MED ORDER — AMLODIPINE BESYLATE 10 MG PO TABS: 10.0000 mg | ORAL_TABLET | Freq: Every day | ORAL | 1 refills | Status: DC

## 2019-05-07 MED ORDER — POTASSIUM CHLORIDE ER 10 MEQ PO TBCR: 20.0000 meq | EXTENDED_RELEASE_TABLET | Freq: Every day | ORAL | 1 refills | Status: DC

## 2019-05-07 MED ORDER — VALSARTAN 160 MG PO TABS: 160.0000 mg | ORAL_TABLET | Freq: Every day | ORAL | 1 refills | Status: DC

## 2019-05-07 MED ORDER — CLONIDINE HCL 0.3 MG PO TABS: 0.3000 mg | ORAL_TABLET | Freq: Two times a day (BID) | ORAL | 1 refills | Status: DC

## 2019-05-07 NOTE — Discharge Summary (Signed)
Physician Discharge Summary  Sherry Fitzpatrick GOT:[id number] DOB: 1950/10/23 DOA: 05/05/2019  PCP: Lilian Coma., MD  Admit date: 05/05/2019 Discharge date: 05/07/2019  Admitted From: Home Disposition:  Home  Discharge Condition:Stable CODE STATUS:FULL Diet recommendation: Heart Healthy   Brief/Interim Summary:  Patient is a 68 year old female with history of mild hypertension, grade 2 diastolic dysfunction, strokes x2 with no residual deficits who presents to the emergency department with complaints of acute onset of right face, right arm, right leg numbness and weakness.  Code stroke was called in the emergency department.  Neurology consulted.  Patient admitted for further work-up.  MRI did not show any stroke.  She was also seen by neurology during the hospitalization.  Hospital course remarkable for persistent hypertension.  Her home medications restarted.  Currently blood pressure stable.  She is hemodynamically stable for discharge to home today.  She was seen by physical therapy and recommended home health.  Following problems were addressed during her hospitalization:  Suspected CVA/likely TIA: Presented with right-sided numbness, right lower extremity weakness.MRI showed Chronic ischemic microangiopathy without acute intracranial abnormality.  Neurology was following. This morning she does not have any of those symptoms. Patient seen by physical therapy and occupational therapy and recommended home health on discharge. Underwent echocardiogram which showed ejection fraction of 55 to 60%, no wall motion abnormality, no source for embolus. Carotid Dopplers did not show any significant stenosis. Neurology was following.  Malignant hypertension: Noted to be severely hypertensive on presentation.  We restarted her home meds.  BP this morning is stable.   Chronic diastolic CHF: Currently euvolemic.  On Lasix at home.  CKD stage III: Currently kidney function is at baseline.  On  reviewing her previous records her baseline creatinine is around 1.3.  Hypokalemia: Supplemented with potassium.Continue supplementation at home.   Discharge Diagnoses:  Principal Problem:   Acute ischemic stroke  [facility]) Active Problems:   Malignant hypertension   Chronic diastolic CHF (congestive heart failure) [facility])    Discharge Instructions  Discharge Instructions    Diet - low sodium heart healthy   Complete by: As directed    Discharge instructions   Complete by: As directed    1)Please follow up with a PCP in a week.Do a BMP test during the follow up. 2)Take prescribed medications as instructed. 3)Monitor your blood pressure at home.   Increase activity slowly   Complete by: As directed      Allergies as of 05/07/2019      Reactions   Morphine And Related Hives, Nausea And Vomiting   Penicillins Hives   Has patient had a PCN reaction causing immediate rash, facial/tongue/throat swelling, SOB or lightheadedness with hypotension: Yes Has patient had a PCN reaction causing severe rash involving mucus membranes or skin necrosis: No Has patient had a PCN reaction that required hospitalization: No Has patient had a PCN reaction occurring within the last 10 years: No If all of the above answers are "NO", then may proceed with Cephalosporin use.   Percocet [oxycodone-acetaminophen] Nausea And Vomiting   Ace Inhibitors Nausea And Vomiting, Cough   Iodinated Diagnostic Agents Nausea And Vomiting      Iodine Itching   Etodolac Nausea And Vomiting   Tramadol Nausea And Vomiting   Vicodin [hydrocodone-acetaminophen] Nausea And Vomiting      Medication List    STOP taking these medications   hydrochlorothiazide 25 MG tablet Commonly known as: HYDRODIURIL     TAKE these medications   albuterol 108 (90 Base) MCG/ACT  inhaler Commonly known as: VENTOLIN HFA Inhale 2 puffs into the lungs every 6 (six) hours as needed for wheezing. What changed: reasons to take this    amLODipine 10 MG tablet Commonly known as: NORVASC Take 1 tablet (10 mg total) by mouth daily.   atorvastatin 80 MG tablet Commonly known as: LIPITOR Take 1 tablet (80 mg total) by mouth daily. What changed: when to take this   cloNIDine 0.3 MG tablet Commonly known as: CATAPRES Take 1 tablet (0.3 mg total) by mouth 2 (two) times daily.   clopidogrel 75 MG tablet Commonly known as: PLAVIX Take 1 tablet (75 mg total) by mouth daily.   dextromethorphan-guaiFENesin 30-600 MG 12hr tablet Commonly known as: MUCINEX DM Take 1 tablet by mouth 2 (two) times daily. What changed:   when to take this  reasons to take this   furosemide 40 MG tablet Commonly known as: LASIX Take 1 tablet (40 mg total) by mouth 2 (two) times daily. What changed:   how much to take  when to take this   potassium chloride 10 MEQ tablet Commonly known as: K-DUR Take 2 tablets (20 mEq total) by mouth daily. What changed: how much to take   Tylenol 8 Hour Arthritis Pain 650 MG CR tablet Generic drug: acetaminophen Take 1,300 mg by mouth every 8 (eight) hours as needed for pain (or headaches).   valsartan 160 MG tablet Commonly known as: DIOVAN Take 1 tablet (160 mg total) by mouth daily.            Durable Medical Equipment  (From admission, onward)         Start     Ordered   05/06/19 1603  For home use only DME 3 n 1  Once     05/06/19 1602         Follow-up Information    Jobe, Alexis Goodell., MD. Schedule an appointment as soon as possible for a visit in 1 week(s).   Specialty: Internal Medicine Contact information: [place] [address] [phone]          Allergies  Allergen Reactions  . Morphine And Related Hives and Nausea And Vomiting  . Penicillins Hives    Has patient had a PCN reaction causing immediate rash, facial/tongue/throat swelling, SOB or lightheadedness with hypotension: Yes Has patient had a PCN reaction causing severe rash  involving mucus membranes or skin necrosis: No Has patient had a PCN reaction that required hospitalization: No Has patient had a PCN reaction occurring within the last 10 years: No If all of the above answers are "NO", then may proceed with Cephalosporin use.  Marland Kitchen Percocet [Oxycodone-Acetaminophen] Nausea And Vomiting  . Ace Inhibitors Nausea And Vomiting and Cough  . Iodinated Diagnostic Agents Nausea And Vomiting       . Iodine Itching  . Etodolac Nausea And Vomiting  . Tramadol Nausea And Vomiting  . Vicodin [Hydrocodone-Acetaminophen] Nausea And Vomiting    Consultations:  Neurology   Procedures/Studies: Dg Chest 2 View  Result Date: 05/05/2019 CLINICAL DATA:  Right-sided chest pain. Acute ischemic stroke. EXAM: CHEST - 2 VIEW COMPARISON:  07/05/2018 and 07/01/2018 FINDINGS: The heart size and pulmonary vascularity are normal. Lungs are clear. No effusions. No significant bone abnormality. IMPRESSION: No active cardiopulmonary disease. Electronically Signed   By: [name] M.D.   On: 05/05/2019 20:15   Mr Brain Wo Contrast  Result Date: 05/06/2019 CLINICAL DATA:  Focal neurologic deficit. Right-sided numbness and weakness. EXAM:  MRI HEAD WITHOUT CONTRAST TECHNIQUE: Multiplanar, multiecho pulse sequences of the brain and surrounding structures were obtained without intravenous contrast. COMPARISON:  Head CT 05/05/2019 FINDINGS: BRAIN: There is no acute infarct, acute hemorrhage or extra-axial collection. The midline structures are normal. There is no midline shift or mass effect. Early confluent hyperintense T2-weighted signal of the periventricular and deep white matter, most commonly due to chronic ischemic microangiopathy. The cerebral and cerebellar volume are age-appropriate. There is no hydrocephalus. Susceptibility-sensitive sequences show no chronic microhemorrhage or superficial siderosis. VASCULAR: The major intracranial arterial and venous sinus flow voids are normal.  SKULL AND UPPER CERVICAL SPINE: Calvarial bone marrow signal is normal. There is no skull base mass. The visualized upper cervical spine and soft tissues are normal. SINUSES/ORBITS: There are no fluid levels or advanced mucosal thickening. The mastoid air cells and middle ear cavities are free of fluid. The orbits are normal. IMPRESSION: Chronic ischemic microangiopathy without acute intracranial abnormality. Electronically Signed   By: Ulyses Jarred M.D.   On: 05/06/2019 00:42   Ct Head Code Stroke Wo Contrast  Result Date: 05/05/2019 CLINICAL DATA:  Code stroke. Right-sided weakness and facial droop. Last seen normal 1530 hours EXAM: CT HEAD WITHOUT CONTRAST TECHNIQUE: Contiguous axial images were obtained from the base of the skull through the vertex without intravenous contrast. COMPARISON:  01/10/2017 FINDINGS: Brain: No evidence of acute cortical or large vessel territory infarction. Areas of low-density in the basal ganglia and internal capsule regions presumed represent old small vessel infarctions. No mass lesion, hemorrhage, hydrocephalus or extra-axial collection. Vascular: There is atherosclerotic calcification of the major vessels at the base of the brain. Skull: Negative Sinuses/Orbits: Clear/normal Other: None ASPECTS (Keller Stroke Program Early CT Score) - Ganglionic level infarction (caudate, lentiform nuclei, internal capsule, insula, M1-M3 cortex): 7 - Supraganglionic infarction (M4-M6 cortex): 3 Total score (0-10 with 10 being normal): 10 IMPRESSION: 1. No acute finding suspected by CT. No sign of large vessel insult. Areas of low-density in the basal ganglia and internal capsule regions consistent with old small vessel infarctions. 2. ASPECTS is 10 3. These results were communicated to Dr. Lorraine Lax at 4:52 pmon 7/21/2020by text page via the [facility] messaging system. Electronically Signed   By: Nelson Chimes M.D.   On: 05/05/2019 16:54   Vas US Carotid (at [facility] Only)  Result Date:  05/06/2019 Carotid Arterial Duplex Study Indications:       CVA and Weakness and numbness on the right side face, arm and                    leg. Risk Factors:      Hypertension. Comparison Study:  Prior study on 04/30/15 showed bilateral 1-39% stenosis. No                    change. Performing Technologist: Oda Cogan RDMS, RVT  Examination Guidelines: A complete evaluation includes B-mode imaging, spectral Doppler, color Doppler, and power Doppler as needed of all accessible portions of each vessel. Bilateral testing is considered an integral part of a complete examination. Limited examinations for reoccurring indications may be performed as noted.  Right Carotid Findings: +----------+--------+--------+--------+--------+--------+           PSV cm/sEDV cm/sStenosisDescribeComments +----------+--------+--------+--------+--------+--------+ CCA Prox  103     15                               +----------+--------+--------+--------+--------+--------+ CCA  Distal52      16                               +----------+--------+--------+--------+--------+--------+ ICA Prox  68      24      1-39%                    +----------+--------+--------+--------+--------+--------+ ICA Distal89      26                               +----------+--------+--------+--------+--------+--------+ ECA       100     10                               +----------+--------+--------+--------+--------+--------+ +----------+--------+-------+----------------+-------------------+           PSV cm/sEDV cmsDescribe        Arm Pressure (mmHG) +----------+--------+-------+----------------+-------------------+ [id number]            Multiphasic, WNL                    +----------+--------+-------+----------------+-------------------+ +---------+--------+--+--------+--+---------+ VertebralPSV cm/s29EDV cm/s13Antegrade +---------+--------+--+--------+--+---------+  Left Carotid Findings:  +----------+--------+--------+--------+--------+--------+           PSV cm/sEDV cm/sStenosisDescribeComments +----------+--------+--------+--------+--------+--------+ CCA Prox  65      11                               +----------+--------+--------+--------+--------+--------+ CCA Distal47      13                               +----------+--------+--------+--------+--------+--------+ ICA Prox  50      23      1-39%                    +----------+--------+--------+--------+--------+--------+ ICA Distal76      33                               +----------+--------+--------+--------+--------+--------+ ECA       100     15                               +----------+--------+--------+--------+--------+--------+ +----------+--------+--------+----------------+-------------------+ SubclavianPSV cm/sEDV cm/sDescribe        Arm Pressure (mmHG) +----------+--------+--------+----------------+-------------------+           80              Multiphasic, WNL                    +----------+--------+--------+----------------+-------------------+ +---------+--------+--+--------+--+---------+ VertebralPSV cm/s43EDV cm/s16Antegrade +---------+--------+--+--------+--+---------+  Summary: Right Carotid: Velocities in the right ICA are consistent with a 1-39% stenosis. Left Carotid: Velocities in the left ICA are consistent with a 1-39% stenosis. Vertebrals: Bilateral vertebral arteries demonstrate antegrade flow. *See table(s) above for measurements and observations.     Preliminary        Subjective:  Patient seen and examined the bedside this morning.  Hemodynamically stable.  Stable for discharge to home today.  Discharge Exam: Vitals:   05/07/19 0740 05/07/19 1000  BP: 138/74 (!) 170/74  Pulse: (!) 56  Resp: 17   Temp: 98.2 F (36.8 C)   SpO2: 97%    Vitals:   05/06/19 2314 05/07/19 0357 05/07/19 0740 05/07/19 1000  BP: (!) 122/55 (!) 147/61 138/74 (!)  170/74  Pulse: (!) 58 (!) 57 (!) 56   Resp: 16 15 17    Temp: 98.1 F (36.7 C) 98.3 F (36.8 C) 98.2 F (36.8 C)   TempSrc: Oral Oral Oral   SpO2: 98% 99% 97%   Weight:      Height:        General: Pt is alert, awake, not in acute distress Cardiovascular: RRR, S1/S2 +, no rubs, no gallops Respiratory: CTA bilaterally, no wheezing, no rhonchi Abdominal: Soft, NT, ND, bowel sounds + Extremities: no edema, no cyanosis    The results of significant diagnostics from this hospitalization (including imaging, microbiology, ancillary and laboratory) are listed below for reference.     Microbiology: Recent Results (from the past 240 hour(s))  SARS Coronavirus 2 (CEPHEID - Performed in [place] hospital lab), Hosp Order     Status: None   Collection Time: 05/05/19  5:42 PM   Specimen: Nasopharyngeal Swab  Result Value Ref Range Status   SARS Coronavirus 2 NEGATIVE NEGATIVE Final    Comment: (NOTE) If result is NEGATIVE SARS-CoV-2 target nucleic acids are NOT DETECTED. The SARS-CoV-2 RNA is generally detectable in upper and lower  respiratory specimens during the acute phase of infection. The lowest  concentration of SARS-CoV-2 viral copies this assay can detect is 250  copies / mL. A negative result does not preclude SARS-CoV-2 infection  and should not be used as the sole basis for treatment or other  patient management decisions.  A negative result may occur with  improper specimen collection / handling, submission of specimen other  than nasopharyngeal swab, presence of viral mutation(s) within the  areas targeted by this assay, and inadequate number of viral copies  (<250 copies / mL). A negative result must be combined with clinical  observations, patient history, and epidemiological information. If result is POSITIVE SARS-CoV-2 target nucleic acids are DETECTED. The SARS-CoV-2 RNA is generally detectable in upper and lower  respiratory specimens dur ing the acute phase  of infection.  Positive  results are indicative of active infection with SARS-CoV-2.  Clinical  correlation with patient history and other diagnostic information is  necessary to determine patient infection status.  Positive results do  not rule out bacterial infection or co-infection with other viruses. If result is PRESUMPTIVE POSTIVE SARS-CoV-2 nucleic acids MAY BE PRESENT.   A presumptive positive result was obtained on the submitted specimen  and confirmed on repeat testing.  While 2019 novel coronavirus  (SARS-CoV-2) nucleic acids may be present in the submitted sample  additional confirmatory testing may be necessary for epidemiological  and / or clinical management purposes  to differentiate between  SARS-CoV-2 and other Sarbecovirus currently known to infect humans.  If clinically indicated additional testing with an alternate test  methodology [phone]) is advised. The SARS-CoV-2 RNA is generally  detectable in upper and lower respiratory sp ecimens during the acute  phase of infection. The expected result is Negative. Fact Sheet for Patients:  StrictlyIdeas.no Fact Sheet for Healthcare Providers: BankingDealers.co.za This test is not yet approved or cleared by the [place] FDA and has been authorized for detection and/or diagnosis of SARS-CoV-2 by FDA under an Emergency Use Authorization (EUA).  This EUA will remain in effect (meaning this test can be used) for the duration  of the COVID-19 declaration under Section 564(b)(1) of the Act, 21 U.S.C. section 360bbb-3(b)(1), unless the authorization is terminated or revoked sooner. Performed at [facility], [place] [address] [place],  [address]      Labs: BNP (last 3 results) Recent Labs    07/01/18 1146  BNP 875.6*   Basic Metabolic Panel: Recent Labs  Lab 05/05/19 1638 05/05/19 1642 05/06/19 0457  NA 139 141 141  K 3.0* 3.1* 3.3*  CL 103 102 103   CO2 25  --  29  GLUCOSE 118* 113* 127*  BUN 19 21 18   CREATININE 1.21* 1.20* 1.26*  CALCIUM 8.9  --  8.7*   Liver Function Tests: Recent Labs  Lab 05/05/19 1638  AST 19  ALT 16  ALKPHOS 65  BILITOT 0.2*  PROT 7.3  ALBUMIN 3.4*   No results for input(s): LIPASE, AMYLASE in the last 168 hours. No results for input(s): AMMONIA in the last 168 hours. CBC: Recent Labs  Lab 05/05/19 1638 05/05/19 1642  WBC 7.9  --   NEUTROABS 3.9  --   HGB 12.6 12.9  HCT 38.4 38.0  MCV 87.9  --   PLT 301  --    Cardiac Enzymes: No results for input(s): CKTOTAL, CKMB, CKMBINDEX, TROPONINI in the last 168 hours. BNP: Invalid input(s): POCBNP CBG: Recent Labs  Lab 05/05/19 1709  GLUCAP 106*   D-Dimer No results for input(s): DDIMER in the last 72 hours. Hgb A1c Recent Labs    05/06/19 0457  HGBA1C 6.9*   Lipid Profile Recent Labs    05/06/19 0457  CHOL 165  HDL 34*  LDLCALC 104*  TRIG 136  CHOLHDL 4.9   Thyroid function studies No results for input(s): TSH, T4TOTAL, T3FREE, THYROIDAB in the last 72 hours.  Invalid input(s): FREET3 Anemia work up No results for input(s): VITAMINB12, FOLATE, FERRITIN, TIBC, IRON, RETICCTPCT in the last 72 hours. Urinalysis    Component Value Date/Time   COLORURINE YELLOW 07/05/2018 1241   APPEARANCEUR CLEAR 07/05/2018 1241   LABSPEC 1.012 07/05/2018 1241   PHURINE 8.0 07/05/2018 1241   GLUCOSEU NEGATIVE 07/05/2018 1241   HGBUR NEGATIVE 07/05/2018 1241   BILIRUBINUR NEGATIVE 07/05/2018 1241   KETONESUR NEGATIVE 07/05/2018 1241   PROTEINUR NEGATIVE 07/05/2018 1241   UROBILINOGEN 0.2 04/14/2015 1451   NITRITE NEGATIVE 07/05/2018 1241   LEUKOCYTESUR NEGATIVE 07/05/2018 1241   Sepsis Labs Invalid input(s): PROCALCITONIN,  WBC,  LACTICIDVEN Microbiology Recent Results (from the past 240 hour(s))  SARS Coronavirus 2 (CEPHEID - Performed in [place] hospital lab), Hosp Order     Status: None   Collection Time: 05/05/19  5:42 PM    Specimen: Nasopharyngeal Swab  Result Value Ref Range Status   SARS Coronavirus 2 NEGATIVE NEGATIVE Final    Comment: (NOTE) If result is NEGATIVE SARS-CoV-2 target nucleic acids are NOT DETECTED. The SARS-CoV-2 RNA is generally detectable in upper and lower  respiratory specimens during the acute phase of infection. The lowest  concentration of SARS-CoV-2 viral copies this assay can detect is 250  copies / mL. A negative result does not preclude SARS-CoV-2 infection  and should not be used as the sole basis for treatment or other  patient management decisions.  A negative result may occur with  improper specimen collection / handling, submission of specimen other  than nasopharyngeal swab, presence of viral mutation(s) within the  areas targeted by this assay, and inadequate number of viral copies  (<250 copies / mL). A  negative result must be combined with clinical  observations, patient history, and epidemiological information. If result is POSITIVE SARS-CoV-2 target nucleic acids are DETECTED. The SARS-CoV-2 RNA is generally detectable in upper and lower  respiratory specimens dur ing the acute phase of infection.  Positive  results are indicative of active infection with SARS-CoV-2.  Clinical  correlation with patient history and other diagnostic information is  necessary to determine patient infection status.  Positive results do  not rule out bacterial infection or co-infection with other viruses. If result is PRESUMPTIVE POSTIVE SARS-CoV-2 nucleic acids MAY BE PRESENT.   A presumptive positive result was obtained on the submitted specimen  and confirmed on repeat testing.  While 2019 novel coronavirus  (SARS-CoV-2) nucleic acids may be present in the submitted sample  additional confirmatory testing may be necessary for epidemiological  and / or clinical management purposes  to differentiate between  SARS-CoV-2 and other Sarbecovirus currently known to infect humans.  If  clinically indicated additional testing with an alternate test  methodology [phone]) is advised. The SARS-CoV-2 RNA is generally  detectable in upper and lower respiratory sp ecimens during the acute  phase of infection. The expected result is Negative. Fact Sheet for Patients:  StrictlyIdeas.no Fact Sheet for Healthcare Providers: BankingDealers.co.za This test is not yet approved or cleared by the [place] FDA and has been authorized for detection and/or diagnosis of SARS-CoV-2 by FDA under an Emergency Use Authorization (EUA).  This EUA will remain in effect (meaning this test can be used) for the duration of the COVID-19 declaration under Section 564(b)(1) of the Act, 21 U.S.C. section 360bbb-3(b)(1), unless the authorization is terminated or revoked sooner. Performed at [facility], [place] [address], [place], [place] [address]     Please note: You were cared for by a hospitalist during your hospital stay. Once you are discharged, your primary care physician will handle any further medical issues. Please note that NO REFILLS for any discharge medications will be authorized once you are discharged, as it is imperative that you return to your primary care physician (or establish a relationship with a primary care physician if you do not have one) for your post hospital discharge needs so that they can reassess your need for medications and monitor your lab values.    Time coordinating discharge: 40 minutes  SIGNED:   Shelly Coss, MD  [facility] 05/07/2019, 10:51 AM Pager [phone]  If 7PM-7AM, please contact night-coverage [email or web address] Password [email or web address]

## 2019-05-07 NOTE — Progress Notes (Signed)
Physical Therapy Treatment Patient Details Name: Sherry Fitzpatrick MRN: [id number] DOB: 12/24/1950 Today's Date: 05/07/2019    History of Present Illness Patient is a 68 y/o female presenting with Right side parasthesia, pain and weakness on 05/05/2019. CT head was unremarkable. MRI with Chronic ischemic microangiopathy without acute intracranial abnormality. Past medical history significant for uncontrolled hypertension, CHF with grade 2 diastolic dysfunction, prior stroke with no residual deficits, sleep apnea, obesity. Admitted for TIA versus hypertensive crisis with continued work-up.    PT Comments    Pt progressing towards physical therapy goals. Was able to perform transfers and ambulation with supervision for safety to min guard assist with RW. Pt more steady and safer with RW use. Pt anticipates d/c home today. Will continue to follow and progress as able per POC.     Follow Up Recommendations  Home health PT     Equipment Recommendations  3in1 (PT)    Recommendations for Other Services       Precautions / Restrictions Precautions Precautions: Fall Restrictions Weight Bearing Restrictions: No    Mobility  Bed Mobility               General bed mobility comments: Pt received sitting EOB.   Transfers Overall transfer level: Needs assistance Equipment used: Rolling walker (2 wheeled) Transfers: Sit to/from Stand Sit to Stand: Supervision         General transfer comment: Increased time and supervision for safety when standing from EOB.   Ambulation/Gait Ambulation/Gait assistance: Min guard Gait Distance (Feet): 50 Feet Assistive device: Rolling walker (2 wheeled);None Gait Pattern/deviations: Step-through pattern;Decreased stride length;Trunk flexed Gait velocity: decreased Gait velocity interpretation: 1.31 - 2.62 ft/sec, indicative of limited community ambulator General Gait Details: Initially without AD and then with RW for added support.    Stairs             Wheelchair Mobility    Modified Rankin (Stroke Patients Only) Modified Rankin (Stroke Patients Only) Pre-Morbid Rankin Score: No symptoms Modified Rankin: Moderately severe disability     Balance Overall balance assessment: Mild deficits observed, not formally tested                                          Cognition Arousal/Alertness: Awake/alert Behavior During Therapy: WFL for tasks assessed/performed Overall Cognitive Status: Within Functional Limits for tasks assessed                                        Exercises      General Comments        Pertinent Vitals/Pain Pain Assessment: Faces Faces Pain Scale: No hurt    Home Living                      Prior Function            PT Goals (current goals can now be found in the care plan section) Acute Rehab PT Goals Patient Stated Goal: improve strength PT Goal Formulation: With patient Time For Goal Achievement: 05/20/19 Potential to Achieve Goals: Good Progress towards PT goals: Progressing toward goals    Frequency    Min 4X/week      PT Plan Current plan remains appropriate    Co-evaluation  AM-PAC PT "6 Clicks" Mobility   Outcome Measure  Help needed turning from your back to your side while in a flat bed without using bedrails?: A Little Help needed moving from lying on your back to sitting on the side of a flat bed without using bedrails?: A Little Help needed moving to and from a bed to a chair (including a wheelchair)?: A Little Help needed standing up from a chair using your arms (e.g., wheelchair or bedside chair)?: A Little Help needed to walk in hospital room?: A Little Help needed climbing 3-5 steps with a railing? : A Lot 6 Click Score: 17    End of Session Equipment Utilized During Treatment: Gait belt Activity Tolerance: Patient tolerated treatment well Patient left: in chair;with call bell/phone  within reach;with chair alarm set Nurse Communication: Mobility status PT Visit Diagnosis: Unsteadiness on feet (R26.81);Other abnormalities of gait and mobility (R26.89);Muscle weakness (generalized) (M62.81)     Time: 5465-6812 PT Time Calculation (min) (ACUTE ONLY): 9 min  Charges:  $Therapeutic Activity: 8-22 mins                     Rolinda Roan, PT, DPT Acute Rehabilitation Services Pager: [phone] Office: [phone]    Thelma Comp 05/07/2019, 2:20 PM

## 2019-07-13 ENCOUNTER — Encounter (HOSPITAL_COMMUNITY): Payer: Self-pay | Admitting: Emergency Medicine

## 2019-07-13 ENCOUNTER — Emergency Department (HOSPITAL_COMMUNITY): Payer: Medicare HMO

## 2019-07-13 ENCOUNTER — Other Ambulatory Visit: Payer: Self-pay

## 2019-07-13 ENCOUNTER — Observation Stay (HOSPITAL_COMMUNITY)
Admission: EM | Admit: 2019-07-13 | Discharge: 2019-07-16 | Disposition: A | Discharge status: Home / Self Care | Payer: Medicare HMO | Attending: Internal Medicine | Admitting: Internal Medicine

## 2019-07-13 DIAGNOSIS — Z96641 Presence of right artificial hip joint: Secondary | ICD-10-CM | POA: Diagnosis not present

## 2019-07-13 DIAGNOSIS — M545 Low back pain: Secondary | ICD-10-CM | POA: Insufficient documentation

## 2019-07-13 DIAGNOSIS — Z8249 Family history of ischemic heart disease and other diseases of the circulatory system: Secondary | ICD-10-CM | POA: Insufficient documentation

## 2019-07-13 DIAGNOSIS — J9601 Acute respiratory failure with hypoxia: Secondary | ICD-10-CM

## 2019-07-13 DIAGNOSIS — Z20828 Contact with and (suspected) exposure to other viral communicable diseases: Secondary | ICD-10-CM | POA: Diagnosis not present

## 2019-07-13 DIAGNOSIS — Z79899 Other long term (current) drug therapy: Secondary | ICD-10-CM | POA: Diagnosis not present

## 2019-07-13 DIAGNOSIS — E785 Hyperlipidemia, unspecified: Secondary | ICD-10-CM | POA: Diagnosis not present

## 2019-07-13 DIAGNOSIS — M1990 Unspecified osteoarthritis, unspecified site: Secondary | ICD-10-CM | POA: Diagnosis not present

## 2019-07-13 DIAGNOSIS — Z880 Allergy status to penicillin: Secondary | ICD-10-CM | POA: Insufficient documentation

## 2019-07-13 DIAGNOSIS — Z7902 Long term (current) use of antithrombotics/antiplatelets: Secondary | ICD-10-CM | POA: Diagnosis not present

## 2019-07-13 DIAGNOSIS — R079 Chest pain, unspecified: Secondary | ICD-10-CM | POA: Diagnosis not present

## 2019-07-13 DIAGNOSIS — Z23 Encounter for immunization: Secondary | ICD-10-CM | POA: Diagnosis not present

## 2019-07-13 DIAGNOSIS — I5030 Unspecified diastolic (congestive) heart failure: Secondary | ICD-10-CM | POA: Diagnosis present

## 2019-07-13 DIAGNOSIS — R0602 Shortness of breath: Secondary | ICD-10-CM | POA: Diagnosis not present

## 2019-07-13 DIAGNOSIS — Z8673 Personal history of transient ischemic attack (TIA), and cerebral infarction without residual deficits: Secondary | ICD-10-CM | POA: Diagnosis not present

## 2019-07-13 DIAGNOSIS — I110 Hypertensive heart disease with heart failure: Secondary | ICD-10-CM | POA: Insufficient documentation

## 2019-07-13 DIAGNOSIS — E876 Hypokalemia: Secondary | ICD-10-CM | POA: Diagnosis present

## 2019-07-13 DIAGNOSIS — E669 Obesity, unspecified: Secondary | ICD-10-CM | POA: Diagnosis not present

## 2019-07-13 DIAGNOSIS — I2510 Atherosclerotic heart disease of native coronary artery without angina pectoris: Secondary | ICD-10-CM | POA: Insufficient documentation

## 2019-07-13 DIAGNOSIS — Z209 Contact with and (suspected) exposure to unspecified communicable disease: Secondary | ICD-10-CM | POA: Diagnosis not present

## 2019-07-13 DIAGNOSIS — M6281 Muscle weakness (generalized): Secondary | ICD-10-CM | POA: Insufficient documentation

## 2019-07-13 DIAGNOSIS — G4733 Obstructive sleep apnea (adult) (pediatric): Secondary | ICD-10-CM | POA: Diagnosis not present

## 2019-07-13 DIAGNOSIS — Z6841 Body Mass Index (BMI) 40.0 and over, adult: Secondary | ICD-10-CM | POA: Diagnosis not present

## 2019-07-13 DIAGNOSIS — J45901 Unspecified asthma with (acute) exacerbation: Secondary | ICD-10-CM | POA: Diagnosis not present

## 2019-07-13 DIAGNOSIS — F17210 Nicotine dependence, cigarettes, uncomplicated: Secondary | ICD-10-CM | POA: Diagnosis not present

## 2019-07-13 DIAGNOSIS — I160 Hypertensive urgency: Secondary | ICD-10-CM | POA: Diagnosis not present

## 2019-07-13 DIAGNOSIS — I5032 Chronic diastolic (congestive) heart failure: Secondary | ICD-10-CM | POA: Diagnosis not present

## 2019-07-13 DIAGNOSIS — R0600 Dyspnea, unspecified: Secondary | ICD-10-CM | POA: Diagnosis present

## 2019-07-13 DIAGNOSIS — Z0184 Encounter for antibody response examination: Secondary | ICD-10-CM | POA: Diagnosis not present

## 2019-07-13 DIAGNOSIS — I10 Essential (primary) hypertension: Secondary | ICD-10-CM | POA: Diagnosis not present

## 2019-07-13 DIAGNOSIS — K219 Gastro-esophageal reflux disease without esophagitis: Secondary | ICD-10-CM | POA: Insufficient documentation

## 2019-07-13 DIAGNOSIS — R05 Cough: Secondary | ICD-10-CM | POA: Diagnosis not present

## 2019-07-13 DIAGNOSIS — J45909 Unspecified asthma, uncomplicated: Secondary | ICD-10-CM

## 2019-07-13 DIAGNOSIS — Z888 Allergy status to other drugs, medicaments and biological substances status: Secondary | ICD-10-CM | POA: Insufficient documentation

## 2019-07-13 DIAGNOSIS — Z885 Allergy status to narcotic agent status: Secondary | ICD-10-CM | POA: Insufficient documentation

## 2019-07-13 DIAGNOSIS — I700 Atherosclerosis of aorta: Secondary | ICD-10-CM | POA: Diagnosis not present

## 2019-07-13 DIAGNOSIS — R062 Wheezing: Secondary | ICD-10-CM | POA: Diagnosis not present

## 2019-07-13 LAB — URINALYSIS, ROUTINE W REFLEX MICROSCOPIC
Bacteria, UA: NONE SEEN
Bilirubin Urine: NEGATIVE
Glucose, UA: NEGATIVE mg/dL
Hgb urine dipstick: NEGATIVE
Ketones, ur: NEGATIVE mg/dL
Leukocytes,Ua: NEGATIVE
Nitrite: NEGATIVE
Protein, ur: 30 mg/dL — AB
Specific Gravity, Urine: 1.015 (ref 1.005–1.030)
pH: 5 (ref 5.0–8.0)

## 2019-07-13 LAB — BASIC METABOLIC PANEL
Anion gap: 11 (ref 5–15)
BUN: 12 mg/dL (ref 8–23)
CO2: 26 mmol/L (ref 22–32)
Calcium: 8.5 mg/dL — ABNORMAL LOW (ref 8.9–10.3)
Chloride: 102 mmol/L (ref 98–111)
Creatinine, Ser: 1.15 mg/dL — ABNORMAL HIGH (ref 0.44–1.00)
GFR calc Af Amer: 56 mL/min — ABNORMAL LOW (ref >60)
GFR calc non Af Amer: 48 mL/min — ABNORMAL LOW (ref >60)
Glucose, Bld: 136 mg/dL — ABNORMAL HIGH (ref 70–99)
Potassium: 3.2 mmol/L — ABNORMAL LOW (ref 3.5–5.1)
Sodium: 139 mmol/L (ref 135–145)

## 2019-07-13 LAB — CBC
HCT: 36.3 % (ref 36.0–46.0)
Hemoglobin: 12.3 g/dL (ref 12.0–15.0)
MCH: 30.4 pg (ref 26.0–34.0)
MCHC: 33.9 g/dL (ref 30.0–36.0)
MCV: 89.9 fL (ref 80.0–100.0)
Platelets: 299 10*3/uL (ref 150–400)
RBC: 4.04 MIL/uL (ref 3.87–5.11)
RDW: 14.7 % (ref 11.5–15.5)
WBC: 6.7 10*3/uL (ref 4.0–10.5)
nRBC: 0 % (ref 0.0–0.2)

## 2019-07-13 LAB — SARS CORONAVIRUS 2 BY RT PCR (HOSPITAL ORDER, PERFORMED IN ~~LOC~~ HOSPITAL LAB): SARS Coronavirus 2: NEGATIVE

## 2019-07-13 LAB — TROPONIN I (HIGH SENSITIVITY)
Troponin I (High Sensitivity): 16 ng/L (ref <18)
Troponin I (High Sensitivity): 16 ng/L (ref <18)

## 2019-07-13 LAB — BRAIN NATRIURETIC PEPTIDE: B Natriuretic Peptide: 96.7 pg/mL (ref 0.0–100.0)

## 2019-07-13 MED ORDER — AMLODIPINE BESYLATE 10 MG PO TABS
10.0000 mg | ORAL_TABLET | Freq: Every day | ORAL | Status: DC
  Administered 2019-07-13 – 2019-07-16 (×4): 10 mg via ORAL
  Filled 2019-07-13 (×4): qty 1

## 2019-07-13 MED ORDER — CLONIDINE HCL 0.3 MG PO TABS
0.3000 mg | ORAL_TABLET | Freq: Two times a day (BID) | ORAL | Status: DC
  Administered 2019-07-13 – 2019-07-16 (×7): 0.3 mg via ORAL
  Filled 2019-07-13 (×7): qty 1

## 2019-07-13 MED ORDER — ENOXAPARIN SODIUM 60 MG/0.6ML ~~LOC~~ SOLN
0.5000 mg/kg | SUBCUTANEOUS | Status: DC
  Administered 2019-07-14 – 2019-07-16 (×3): 55 mg via SUBCUTANEOUS
  Filled 2019-07-13 (×3): qty 0.6

## 2019-07-13 MED ORDER — CLOPIDOGREL BISULFATE 75 MG PO TABS
75.0000 mg | ORAL_TABLET | Freq: Every day | ORAL | Status: DC
  Administered 2019-07-13 – 2019-07-16 (×4): 75 mg via ORAL
  Filled 2019-07-13 (×4): qty 1

## 2019-07-13 MED ORDER — SODIUM CHLORIDE 0.9% FLUSH
3.0000 mL | Freq: Once | INTRAVENOUS | Status: AC
  Administered 2019-07-13: 3 mL via INTRAVENOUS

## 2019-07-13 MED ORDER — IRBESARTAN 150 MG PO TABS
150.0000 mg | ORAL_TABLET | Freq: Every day | ORAL | Status: DC
  Administered 2019-07-14 – 2019-07-16 (×3): 150 mg via ORAL
  Filled 2019-07-13 (×4): qty 1

## 2019-07-13 MED ORDER — LABETALOL HCL 5 MG/ML IV SOLN
10.0000 mg | Freq: Once | INTRAVENOUS | Status: AC
  Administered 2019-07-13: 10 mg via INTRAVENOUS
  Filled 2019-07-13: qty 4

## 2019-07-13 MED ORDER — ALBUTEROL SULFATE HFA 108 (90 BASE) MCG/ACT IN AERS: 1.0000 | INHALATION_SPRAY | RESPIRATORY_TRACT | Status: DC | PRN

## 2019-07-13 MED ORDER — FUROSEMIDE 10 MG/ML IJ SOLN
40.0000 mg | Freq: Once | INTRAMUSCULAR | Status: AC
  Administered 2019-07-13: 16:00:00 40 mg via INTRAVENOUS
  Filled 2019-07-13: qty 4

## 2019-07-13 MED ORDER — IPRATROPIUM-ALBUTEROL 0.5-2.5 (3) MG/3ML IN SOLN: 3.0000 mL | Freq: Once | RESPIRATORY_TRACT | Status: DC

## 2019-07-13 MED ORDER — IPRATROPIUM-ALBUTEROL 0.5-2.5 (3) MG/3ML IN SOLN: 3.0000 mL | Freq: Four times a day (QID) | RESPIRATORY_TRACT | Status: DC | PRN

## 2019-07-13 MED ORDER — METOPROLOL TARTRATE 12.5 MG HALF TABLET
12.5000 mg | ORAL_TABLET | Freq: Two times a day (BID) | ORAL | Status: DC
  Administered 2019-07-13 – 2019-07-16 (×6): 12.5 mg via ORAL
  Filled 2019-07-13 (×7): qty 1

## 2019-07-13 MED ORDER — ALBUTEROL SULFATE HFA 108 (90 BASE) MCG/ACT IN AERS: 4.0000 | INHALATION_SPRAY | Freq: Once | RESPIRATORY_TRACT | Status: DC

## 2019-07-13 MED ORDER — ALBUTEROL SULFATE (2.5 MG/3ML) 0.083% IN NEBU
2.5000 mg | INHALATION_SOLUTION | Freq: Once | RESPIRATORY_TRACT | Status: AC
  Administered 2019-07-13: 18:00:00 2.5 mg via RESPIRATORY_TRACT
  Filled 2019-07-13: qty 3

## 2019-07-13 MED ORDER — ATORVASTATIN CALCIUM 80 MG PO TABS
80.0000 mg | ORAL_TABLET | Freq: Every evening | ORAL | Status: DC
  Administered 2019-07-13 – 2019-07-15 (×2): 80 mg via ORAL
  Filled 2019-07-13 (×3): qty 1

## 2019-07-13 MED ORDER — METOPROLOL TARTRATE 25 MG PO TABS: 12.5000 mg | ORAL_TABLET | Freq: Two times a day (BID) | ORAL | Status: DC

## 2019-07-13 MED ORDER — POTASSIUM CHLORIDE CRYS ER 20 MEQ PO TBCR
40.0000 meq | EXTENDED_RELEASE_TABLET | Freq: Two times a day (BID) | ORAL | Status: AC
  Administered 2019-07-13 (×2): 40 meq via ORAL
  Filled 2019-07-13 (×3): qty 2

## 2019-07-13 MED ORDER — FUROSEMIDE 20 MG PO TABS: 40.0000 mg | ORAL_TABLET | Freq: Two times a day (BID) | ORAL | Status: DC

## 2019-07-13 MED ORDER — ALBUTEROL SULFATE (2.5 MG/3ML) 0.083% IN NEBU: 2.5000 mg | INHALATION_SOLUTION | RESPIRATORY_TRACT | Status: DC | PRN

## 2019-07-13 NOTE — ED Notes (Signed)
Pt asked about providing a urine sample. Pt unable to go at this time.

## 2019-07-13 NOTE — ED Notes (Signed)
Pt SpO2 dropped to 82% while sleeping, with good wave form. Placed on 2 liters for comfort and to maintain SpO2. Pt now at 99%

## 2019-07-13 NOTE — H&P (Signed)
Date: 07/13/2019               Patient Name:  Sherry Fitzpatrick MRN: [id number]  DOB: 01-22-1951 Age / Sex: 68 y.o., female   PCP: Lilian Coma., MD         Medical Service: Internal Medicine Teaching Service         Attending Physician: Dr. Aldine Contes, MD    First Contact: Dr. Jeanmarie Hubert, MD Pager: [phone]  Second Contact: Dr. Kathi Ludwig, MD Pager: [phone]       After Hours (After 5p/  First Contact Pager: [phone]  weekends / holidays): Second Contact Pager: [phone]   Chief Complaint: Cough, shortness of breath  History of Present Illness: Sherry Fitzpatrick is a 68 year old female with past medical history of hypertension, TIA, heart failure who presents with a 4 to 5-day history of cough, shortness of breath, headache.  Patient reports that the cough is productive of a yellowish sputum, no blood observed.  Shortness of breath is present at rest but worse with exertion, sleeps with 0 pillows, denies PND symptoms.  Takes albuterol as needed, but denies history of COPD or asthma.  Albuterol helps a little bit with shortness of breath.  Patient also reports a chest pain described as pressure-like, substernal, nonradiating, severe, worse with coughing, waxing and waning, pain feels better when pressing on spot during cough.  Patient reports pain in lower back that has been present for the past couple of days.  Denies jaw pain, arm pain, nausea, vomiting, diaphoresis.  Patient endorses congestion, runny nose, denies fever, nausea, vomiting, dysuria/frequency/urgency, changes in bowel movements, changes in weight.  Meds:  * Amlodipine 10 mg QD * Valsartan 160 mg QD * Clonidine 0.3 mg BID * Hydrochlorothiazide - unclear dose * Furosemide 40 mg QD * Potassium chloride 20 mEq QD * Atorvastatin 80 mg QD  Allergies: Allergies as of 07/13/2019 - Review Complete 07/13/2019  Allergen Reaction Noted  . Morphine and related Hives and Nausea And Vomiting 02/20/2018  .  Penicillins Hives 05/06/2013  . Percocet [oxycodone-acetaminophen] Nausea And Vomiting 05/06/2013  . Ace inhibitors Nausea And Vomiting and Cough 12/03/2014  . Iodinated diagnostic agents Nausea And Vomiting 12/03/2014  . Iodine Itching 06/05/2012  . Etodolac Nausea And Vomiting 12/03/2014  . Tramadol Nausea And Vomiting 05/05/2019  . Vicodin [hydrocodone-acetaminophen] Nausea And Vomiting 02/20/2018   Past Medical History:  Diagnosis Date  . Alterations of sensations, late effect of cerebrovascular disease(438.6)   . Arthritis    "all over"  . Backache, unspecified   . Body mass index 40.0-44.9, adult ([place])   . Carpal tunnel syndrome   . Dizziness and giddiness   . Esophageal reflux   . Generalized pain   . Headache    "had alot til I had pituitary tumor removed"  . Heart murmur   . Other abnormal glucose   . Other and unspecified hyperlipidemia   . Other malaise and fatigue   . Other postprocedural status(V45.89)   . Other symptoms involving cardiovascular system   . Sleep apnea    "did test; they wanted me to get a CPAP but I never did get it" (04/14/2015)  . Stroke ([place]) 06/2003; 02/2010   "minor; minor" ; denies residual on 04/14/2015  . Unspecified disorder of the pituitary gland and its hypothalamic control   . Unspecified essential hypertension   . Unspecified visual disturbance     Family History:  * Diabetes and hypertension in mother *  Diabetes in mother * Sister with COPD * Denies family history of cancer  Social History:  * Denies alcohol usage * Denies past or present usage of cigarettes * Denies recreational drug use * Lives with daughter/son-in-law and 34 year old granddaughter * Used to have PCP but PCP moved to [place]: A complete ROS was negative except as per HPI.   Physical Exam: Blood pressure (!) 167/80, pulse 68, temperature 98.4 F (36.9 C), temperature source Rectal, resp. rate 19, height 5\' 3"  (1.6 m), weight 111.1 kg, SpO2 99  %. Physical Exam  Constitutional: She is well-developed, well-nourished, and in no distress.  HENT:  Head: Normocephalic and atraumatic.  Eyes: EOM are normal. Right eye exhibits no discharge. Left eye exhibits no discharge.  Neck: Normal range of motion. No tracheal deviation present.  Cardiovascular: Normal rate and regular rhythm. Exam reveals no gallop and no friction rub.  No murmur heard. Pulmonary/Chest: Effort normal. No respiratory distress. She has wheezes (mild wheezing bilaterally). She has no rales.  Abdominal: Soft. She exhibits no distension. There is no abdominal tenderness. There is no rebound and no guarding.  Musculoskeletal: Normal range of motion.        General: No tenderness, deformity or edema (Trace peripheral edema).  Neurological: She is alert. Coordination normal.  Skin: Skin is warm and dry. No rash noted. She is not diaphoretic. No erythema.  Psychiatric: Memory and judgment normal.    EKG: personally reviewed my interpretation is frequent premature atrial complexes with no evidence of acute ischemia.  CXR: personally reviewed my interpretation is cardiomegaly with prominence of pulmonary interstitium.   Assessment & Plan by Problem: Principal Problem:   Dyspnea Active Problems:   Hypertensive urgency   Hypokalemia   Chronic diastolic CHF (congestive heart failure) [facility])  Patient is a 68 year old female with past medical history of hypertension, TIA, heart failure who presents with a 5-day history of cough, shortness of breath, headache.  # Heart failure with preserved ejection fraction: # Shortness of breath: Differential includes viral upper respiratory tract infection versus heart failure exacerbation versus asthma exacerbation vs. PE (Well's score 0). Shortness of breath occurs in the setting of headache, congestion, runny nose.  On exam patient does have mild bilateral expiratory wheezes.  CT chest does not show any opacities, effusions, no reported  signs of COPD.  Patient denies history of asthma but does have as needed albuterol at home which helps with shortness of breath. TTE from July of this year shows normal systolic function, and diastolic dysfunction present.  There is prominence of pulmonary interstitium but BNP at admission within normal limits, without significant volume overload on exam.  * Diastolic heart failure may be contributory to presentation, on Lasix 40 mg p.o. daily at home, will administer Lasix 40 mg IV x1 * Duonebs Q6HR PRN * Strict I/Os, daily weights * Check electrolytes, Mag in AM  # Chest pain: Pain is likely secondary to prolonged coughing.  Troponins flat at 16 and 16.  No evidence of acute ischemia on EKG.  We will continue to monitor. * Cardiac monitoring  # Hypertension: Elevated blood pressure on presentation up to 201/83.  May be contributing to headache, shortness of breath, other symptoms.  Will start home medications amlodipine 10 mg daily, metoprolol 12.5 mg twice daily, clonidine 0.3 mg twice daily, and irbesartan 150 mg daily  # Hypokalemia: Potassium 3.2 on presentation, takes K-Dur 20 mEq daily at home.  We will continue replete as  needed.  Will check magnesium in a.m.  # History of TIA: Patient with history of multiple TIAs, will continue home medication of Plavix 75 mg daily  Dispo: Admit patient to Observation with expected length of stay less than 2 midnights.  Signed: Jeanmarie Hubert, MD 07/13/2019, 3:08 PM  Pager: [phone]

## 2019-07-13 NOTE — ED Notes (Signed)
Pt ambulated back to room. Very short of breath upon return. SpO2 maintained at 98%.

## 2019-07-13 NOTE — ED Triage Notes (Signed)
Pt BIB [facility] from home, c/o shortness of breath, cough and chest pain x "several days". Chest pain worse with cough. Afebrile. Hx CHF.

## 2019-07-13 NOTE — ED Provider Notes (Signed)
[facility] EMERGENCY DEPARTMENT Provider Note   CSN: [id number] Arrival date & time: 07/13/19  0636     History   Chief Complaint Chief Complaint  Patient presents with  . Shortness of Breath  . Chest Pain    HPI Sherry Fitzpatrick is a 68 y.o. female.     The history is provided by the patient and medical records. No language interpreter was used.  Chest Pain Pain location:  Substernal area and L chest Pain quality: crushing and pressure   Pain radiates to:  Does not radiate Pain severity:  Severe Onset quality:  Gradual Duration:  3 days Timing:  Constant Progression:  Waxing and waning Chronicity:  New Relieved by:  Nothing Worsened by:  Certain positions and coughing Ineffective treatments:  None tried Associated symptoms: cough, fatigue and shortness of breath   Associated symptoms: no abdominal pain, no altered mental status, no back pain, no claudication, no diaphoresis, no dizziness, no dysphagia, no fever, no headache, no lower extremity edema, no nausea, no near-syncope, no numbness, no palpitations, no vomiting and no weakness   Risk factors: hypertension, obesity and smoking     Past Medical History:  Diagnosis Date  . Alterations of sensations, late effect of cerebrovascular disease(438.6)   . Arthritis    "all over"  . Backache, unspecified   . Body mass index 40.0-44.9, adult ([place])   . Carpal tunnel syndrome   . Dizziness and giddiness   . Esophageal reflux   . Generalized pain   . Headache    "had alot til I had pituitary tumor removed"  . Heart murmur   . Other abnormal glucose   . Other and unspecified hyperlipidemia   . Other malaise and fatigue   . Other postprocedural status(V45.89)   . Other symptoms involving cardiovascular system   . Sleep apnea    "did test; they wanted me to get a CPAP but I never did get it" (04/14/2015)  . Stroke ([place]) 06/2003; 02/2010   "minor; minor" ; denies residual on 04/14/2015  . Unspecified  disorder of the pituitary gland and its hypothalamic control   . Unspecified essential hypertension   . Unspecified visual disturbance     Patient Active Problem List   Diagnosis Date Noted  . Acute ischemic stroke ([place]) 05/05/2019  . AKI (acute kidney injury) ([place]) 07/03/2018  . Hypertensive crisis 07/01/2018  . Tobacco abuse 07/01/2018  . Hypokalemia 07/01/2018  . Chronic diastolic CHF (congestive heart failure) ([place]) 07/01/2018  . Positive D dimer   . Hypertensive urgency 02/22/2018  . Malignant hypertension 02/20/2018  . Shortness of breath 02/20/2018  . Stroke ([place])   . Chest pain 04/14/2015  . Right sided weakness 04/14/2015  . Hyperlipidemia   . Body mass index 40.0-44.9, adult ([place])   . Essential hypertension   . Blood glucose elevated   . Asthma   . OSA (obstructive sleep apnea) 05/06/2013    Past Surgical History:  Procedure Laterality Date  . CARPAL TUNNEL RELEASE Left ~ 2014  . JOINT REPLACEMENT    . TOTAL HIP ARTHROPLASTY Right 11/06/2010  . TRANSPHENOIDAL / TRANSNASAL HYPOPHYSECTOMY / RESECTION PITUITARY TUMOR  06/13/2012  . TUBAL LIGATION  1974     OB History   No obstetric history on file.      Home Medications    Prior to Admission medications   Medication Sig Start Date End Date Taking? Authorizing Provider  acetaminophen (TYLENOL 8 HOUR ARTHRITIS PAIN) 650 MG CR  tablet Take 1,300 mg by mouth every 8 (eight) hours as needed for pain (or headaches).     [provider]  albuterol (PROVENTIL HFA;VENTOLIN HFA) 108 (90 Base) MCG/ACT inhaler Inhale 2 puffs into the lungs every 6 (six) hours as needed for wheezing. Patient taking differently: Inhale 2 puffs into the lungs every 6 (six) hours as needed for wheezing or shortness of breath.  02/24/18   Danford, Suann Larry, MD  amLODipine (NORVASC) 10 MG tablet Take 1 tablet (10 mg total) by mouth daily. 05/07/19   Shelly Coss, MD  atorvastatin (LIPITOR) 80 MG tablet Take 1 tablet (80 mg total)  by mouth daily. Patient taking differently: Take 80 mg by mouth every evening.  02/24/18   Danford, Suann Larry, MD  cloNIDine (CATAPRES) 0.3 MG tablet Take 1 tablet (0.3 mg total) by mouth 2 (two) times daily. 05/07/19   Shelly Coss, MD  clopidogrel (PLAVIX) 75 MG tablet Take 1 tablet (75 mg total) by mouth daily. 02/24/18   Danford, Suann Larry, MD  dextromethorphan-guaiFENesin (MUCINEX DM) 30-600 MG 12hr tablet Take 1 tablet by mouth 2 (two) times daily. Patient taking differently: Take 1 tablet by mouth 2 (two) times daily as needed for cough.  02/24/18   Danford, Suann Larry, MD  furosemide (LASIX) 40 MG tablet Take 1 tablet (40 mg total) by mouth 2 (two) times daily. 05/07/19 06/06/19  Shelly Coss, MD  potassium chloride (K-DUR) 10 MEQ tablet Take 2 tablets (20 mEq total) by mouth daily. 05/07/19   Shelly Coss, MD  valsartan (DIOVAN) 160 MG tablet Take 1 tablet (160 mg total) by mouth daily. 05/07/19   Shelly Coss, MD    Family History Family History  Problem Relation Age of Onset  . Heart failure Mother   . Hypertension Mother   . Diabetes Father   . Hypertension Sister   . Heart failure Sister   . Hypertension Daughter     Social History Social History   Tobacco Use  . Smoking status: Current Some Day Smoker    Types: Cigarettes  . Smokeless tobacco: Never Used  Substance Use Topics  . Alcohol use: Yes    Alcohol/week: 0.0 standard drinks    Comment: rarely; "I don't drink.  But I like it."  . Drug use: No     Allergies   Morphine and related, Penicillins, Percocet [oxycodone-acetaminophen], Ace inhibitors, Iodinated diagnostic agents, Iodine, Etodolac, Tramadol, and Vicodin [hydrocodone-acetaminophen]   Review of Systems Review of Systems  Constitutional: Positive for fatigue. Negative for chills, diaphoresis and fever.  HENT: Negative for congestion and trouble swallowing.   Eyes: Negative for visual disturbance.  Respiratory: Positive for cough  and shortness of breath. Negative for chest tightness, wheezing and stridor.   Cardiovascular: Positive for chest pain. Negative for palpitations, claudication, leg swelling and near-syncope.  Gastrointestinal: Negative for abdominal pain, constipation, diarrhea, nausea and vomiting.  Genitourinary: Negative for dysuria, flank pain and frequency.  Musculoskeletal: Negative for back pain, neck pain and neck stiffness.  Skin: Negative for rash and wound.  Neurological: Negative for dizziness, weakness, light-headedness, numbness and headaches.  Psychiatric/Behavioral: Negative for agitation and confusion.  All other systems reviewed and are negative.    Physical Exam Updated Vital Signs BP (!) 185/94 (BP Location: Right Wrist)   Pulse 85   Temp 99.4 F (37.4 C) (Oral)   Resp 20   Ht 5\' 3"  (1.6 m)   Wt 111.1 kg   SpO2 98%   BMI 43.40 kg/m  Physical Exam Vitals signs and nursing note reviewed.  Constitutional:      General: She is not in acute distress.    Appearance: She is well-developed. She is obese. She is not ill-appearing, toxic-appearing or diaphoretic.  HENT:     Head: Normocephalic and atraumatic.     Right Ear: External ear normal.     Left Ear: External ear normal.     Nose: Nose normal.     Mouth/Throat:     Mouth: Mucous membranes are moist.     Pharynx: No pharyngeal swelling or oropharyngeal exudate.  Eyes:     Conjunctiva/sclera: Conjunctivae normal.     Pupils: Pupils are equal, round, and reactive to light.  Neck:     Musculoskeletal: Normal range of motion and neck supple.  Cardiovascular:     Rate and Rhythm: Normal rate.  Pulmonary:     Effort: Pulmonary effort is normal. No tachypnea or respiratory distress.     Breath sounds: No stridor. Examination of the right-lower field reveals rales. Examination of the left-lower field reveals rales. Rales present. No decreased breath sounds, wheezing or rhonchi.  Chest:     Chest wall: Tenderness present.   Abdominal:     General: There is no distension.     Tenderness: There is no abdominal tenderness. There is no rebound.  Musculoskeletal: Normal range of motion.     Right lower leg: She exhibits no tenderness. No edema.     Left lower leg: She exhibits no tenderness. No edema.  Skin:    General: Skin is warm.     Capillary Refill: Capillary refill takes less than 2 seconds.     Findings: No erythema or rash.  Neurological:     General: No focal deficit present.     Mental Status: She is alert and oriented to person, place, and time.     Motor: No abnormal muscle tone.     Coordination: Coordination normal.     Deep Tendon Reflexes: Reflexes are normal and symmetric.  Psychiatric:        Mood and Affect: Mood normal.      ED Treatments / Results  Labs (all labs ordered are listed, but only abnormal results are displayed) Labs Reviewed  BASIC METABOLIC PANEL - Abnormal; Notable for the following components:      Result Value   Potassium 3.2 (*)    Glucose, Bld 136 (*)    Creatinine, Ser 1.15 (*)    Calcium 8.5 (*)    GFR calc non Af Amer 48 (*)    GFR calc Af Amer 56 (*)    All other components within normal limits  URINALYSIS, ROUTINE W REFLEX MICROSCOPIC - Abnormal; Notable for the following components:   APPearance HAZY (*)    Protein, ur 30 (*)    All other components within normal limits  SARS CORONAVIRUS 2 (HOSPITAL ORDER, [facility])  URINE CULTURE  CBC  BRAIN NATRIURETIC PEPTIDE  HIV ANTIBODY (ROUTINE TESTING W REFLEX)  COMPREHENSIVE METABOLIC PANEL  CBC  MAGNESIUM  TROPONIN I (HIGH SENSITIVITY)  TROPONIN I (HIGH SENSITIVITY)    EKG EKG Interpretation  Date/Time:  Monday July 13 2019 06:42:09 EDT Ventricular Rate:  80 PR Interval:  202 QRS Duration: 80 QT Interval:  400 QTC Calculation: 461 R Axis:   78 Text Interpretation:  Sinus rhythm with Premature atrial complexes Cannot rule out Anterior infarct , age  undetermined ST & T wave abnormality, consider inferolateral ischemia  Abnormal ECG Whenm compared to prior, t wave now upright in lead 2.  No STEMI Confirmed by Antony Blackbird [phone]) on 07/13/2019 8:03:04 AM   Radiology Dg Chest 2 View  Result Date: 07/13/2019 CLINICAL DATA:  Cough and chest pain for 3 days. EXAM: CHEST - 2 VIEW COMPARISON:  05/05/2019 FINDINGS: Stable mild cardiomegaly. Stable pulmonary interstitial prominence. No evidence of acute infiltrate or edema. No evidence of pleural effusion. IMPRESSION: Stable mild cardiomegaly and pulmonary interstitial prominence. No acute findings. Electronically Signed   By: Marlaine Hind M.D.   On: 07/13/2019 07:55    Procedures Procedures (including critical care time)  Medications Ordered in ED Medications  enoxaparin (LOVENOX) injection 40 mg (has no administration in time range)  amLODipine (NORVASC) tablet 10 mg (10 mg Oral Given 07/13/19 1729)  atorvastatin (LIPITOR) tablet 80 mg (80 mg Oral Given 07/13/19 1729)  cloNIDine (CATAPRES) tablet 0.3 mg (0.3 mg Oral Given 07/13/19 1730)  clopidogrel (PLAVIX) tablet 75 mg (75 mg Oral Given 07/13/19 1730)  irbesartan (AVAPRO) tablet 150 mg (has no administration in time range)  potassium chloride SA (K-DUR) CR tablet 40 mEq (40 mEq Oral Given 07/13/19 1543)  metoprolol tartrate (LOPRESSOR) tablet 12.5 mg (has no administration in time range)  albuterol (PROVENTIL) (2.5 MG/3ML) 0.083% nebulizer solution 2.5 mg (has no administration in time range)  albuterol (PROVENTIL) (2.5 MG/3ML) 0.083% nebulizer solution 2.5 mg (has no administration in time range)  sodium chloride flush (NS) 0.9 % injection 3 mL (3 mLs Intravenous Given 07/13/19 1543)  labetalol (NORMODYNE) injection 10 mg (10 mg Intravenous Given 07/13/19 1118)  furosemide (LASIX) injection 40 mg (40 mg Intravenous Given 07/13/19 1543)     Initial Impression / Assessment and Plan / ED Course  I have reviewed the triage vital signs and the  nursing notes.  Pertinent labs & imaging results that were available during my care of the patient were reviewed by me and considered in my medical decision making (see chart for details).        Sherry Fitzpatrick is a 68 y.o. female with a past medical history significant for prior stroke, hypertension, hyperlipidemia, sleep apnea, asthma, obesity, and congestive heart failure who presents with chest pressure and shortness of breath.  Patient reports that she has had discomfort in her left central chest for the last 3 or 4 days.  She reports it is constant but comes and goes.  She reports it is worse when she lays flat.  She does not think it is pleuritic or exertional but is worse with occasional coughing.  She has had a increase in sputum and phlegm with her cough.  No hemoptysis.  She denies any nausea, vomiting, constipation, diarrhea, or urinary symptoms.  She does feel fatigued.  She reports that over the last month or so she has been out of several of her blood pressure medications and her blood pressure is "high".  She says her legs are nonswollen and denies any leg pains.  No history of DVT or PE seen.  She reports her pain was worse at a 9 out of 10 last night with chest pressure but is now a 5 out of 10 and doing better.  She denies diaphoresis or radiation of the discomfort.  On exam, lungs have rales in the bases.  No significant wheezing.  No rhonchi.  Chest is tender to palpation reproducing her discomfort.  Abdomen nontender.  Legs are nonedematous with good pulses in all extremities.  Oxygen saturation is  in the 90s on room air initially.  She is not tachycardic or tachypneic.    EKG shows T wave inversions that are similar to prior.  Lead II is upright now whereas it was inverted previously.  No STEMI.  Heart score calculated as a 6.  Clinically I am concerned that patient has had some elevated blood pressures due to missing her blood pressure medicine for the last month.  I think  this is contributed to likely pulmonary edema causing the crackles on exam and her shortness of breath.  Patient may have had musculoskeletal chest wall pain due to the coughing and tenderness.  Patient will have ambulatory pulse oximeter checked and will have a BNP checked.  I am concerned about some mild fluid overload with this patient.  Will get chest x-ray to look for pneumonia or fluid as well as other labs.   Anticipate reassessment for work-up.  8:49 AM Chest x-ray returned showing interstitial prominence but no pneumonia or significant pulmonary edema/fluid overload.  Chart review shows that similar chest x-ray was consistent when she had pneumonia discovered on follow-up CT scan.  Will discuss with patient possibility of getting a CT to look for infection.  11:14 AM I just went and reassessed the patient after work-up has been continuing.  Blood pressure is now 035 systolic.  She will be given IV labetalol.  She reports that she did not take any blood pressure medicine this morning as she initially stated.  Her labs show unchanging troponin of 16.  BNP not elevated.  Urinalysis shows no infection. CBC and CMP overall reassuring.   We will get the CT to look for occult pneumonia and will give the patient the IV labetalol.  If blood pressures not improved and she continues to have chest discomfort, patient may require admission for hypertensive emergency causing similar symptoms to previous.   1:55 PM CT of the chest confirm no pneumonia.  While returning from CT scan, patient was hypoxic in the 80s and still having shortness of breath.  Blood pressure was addressed with labetalol and it has now into the 160s which patient reports is more of her normal.  Although I am concerned about hypertensive emergency, that his slightly improved.  I am concerned about her hypoxia.  Although she denies any coronavirus exposure, will check for coronavirus.  Patient has had previous imaging which was negative  for pulmonary embolism and she now has a contrast allergy.  Given her new hypoxia, will speak with admitting team for possible d-dimer versus VQ scan.  Chart shows that she had a positive d-dimer in the past when no PE was found.  Patient will be admitted for hypoxia and elevated blood pressures.   Final Clinical Impressions(s) / ED Diagnoses   Final diagnoses:  Acute respiratory failure with hypoxia (HCC)     Clinical Impression: 1. Acute respiratory failure with hypoxia (HCC)     Disposition: Admit  This note was prepared with assistance of Dragon voice recognition software. Occasional wrong-word or sound-a-like substitutions may have occurred due to the inherent limitations of voice recognition software.      Everlie Eble, Gwenyth Allegra, MD 07/13/19 [phone]

## 2019-07-13 NOTE — ED Notes (Signed)
Pt ambulated to the restroom. Pt's O2 was between 96%-99%. Pt's HR was 95-97.

## 2019-07-13 NOTE — ED Notes (Signed)
Spoke with daughter gave update.  Daughter states the pt has had a "cold" for the past few days.

## 2019-07-13 NOTE — ED Notes (Signed)
Pt's rectal temp was 98.4

## 2019-07-13 NOTE — ED Notes (Signed)
Please keep [name] updated at [phone]

## 2019-07-14 DIAGNOSIS — I160 Hypertensive urgency: Secondary | ICD-10-CM | POA: Diagnosis not present

## 2019-07-14 DIAGNOSIS — R0600 Dyspnea, unspecified: Secondary | ICD-10-CM | POA: Diagnosis not present

## 2019-07-14 LAB — URINE CULTURE

## 2019-07-14 LAB — COMPREHENSIVE METABOLIC PANEL
ALT: 17 U/L (ref 0–44)
AST: 24 U/L (ref 15–41)
Albumin: 3.1 g/dL — ABNORMAL LOW (ref 3.5–5.0)
Alkaline Phosphatase: 67 U/L (ref 38–126)
Anion gap: 10 (ref 5–15)
BUN: 24 mg/dL — ABNORMAL HIGH (ref 8–23)
CO2: 25 mmol/L (ref 22–32)
Calcium: 8.5 mg/dL — ABNORMAL LOW (ref 8.9–10.3)
Chloride: 103 mmol/L (ref 98–111)
Creatinine, Ser: 1.34 mg/dL — ABNORMAL HIGH (ref 0.44–1.00)
GFR calc Af Amer: 46 mL/min — ABNORMAL LOW (ref >60)
GFR calc non Af Amer: 40 mL/min — ABNORMAL LOW (ref >60)
Glucose, Bld: 166 mg/dL — ABNORMAL HIGH (ref 70–99)
Potassium: 3.7 mmol/L (ref 3.5–5.1)
Sodium: 138 mmol/L (ref 135–145)
Total Bilirubin: 0.4 mg/dL (ref 0.3–1.2)
Total Protein: 7.1 g/dL (ref 6.5–8.1)

## 2019-07-14 LAB — HIV ANTIBODY (ROUTINE TESTING W REFLEX): HIV Screen 4th Generation wRfx: NONREACTIVE

## 2019-07-14 LAB — CBC
HCT: 35.3 % — ABNORMAL LOW (ref 36.0–46.0)
Hemoglobin: 11.9 g/dL — ABNORMAL LOW (ref 12.0–15.0)
MCH: 30 pg (ref 26.0–34.0)
MCHC: 33.7 g/dL (ref 30.0–36.0)
MCV: 88.9 fL (ref 80.0–100.0)
Platelets: 301 10*3/uL (ref 150–400)
RBC: 3.97 MIL/uL (ref 3.87–5.11)
RDW: 14.6 % (ref 11.5–15.5)
WBC: 12.4 10*3/uL — ABNORMAL HIGH (ref 4.0–10.5)
nRBC: 0 % (ref 0.0–0.2)

## 2019-07-14 LAB — MAGNESIUM: Magnesium: 2.1 mg/dL (ref 1.7–2.4)

## 2019-07-14 MED ORDER — ALBUTEROL SULFATE (2.5 MG/3ML) 0.083% IN NEBU
2.5000 mg | INHALATION_SOLUTION | RESPIRATORY_TRACT | Status: DC
  Administered 2019-07-14: 11:00:00 2.5 mg via RESPIRATORY_TRACT
  Filled 2019-07-14: qty 3

## 2019-07-14 MED ORDER — ALBUTEROL SULFATE (2.5 MG/3ML) 0.083% IN NEBU: 2.5000 mg | INHALATION_SOLUTION | RESPIRATORY_TRACT | Status: DC

## 2019-07-14 MED ORDER — GUAIFENESIN-DM 100-10 MG/5ML PO SYRP
5.0000 mL | ORAL_SOLUTION | ORAL | Status: DC | PRN
  Administered 2019-07-14 – 2019-07-15 (×4): 5 mL via ORAL
  Filled 2019-07-14 (×4): qty 5

## 2019-07-14 MED ORDER — ALBUTEROL SULFATE (2.5 MG/3ML) 0.083% IN NEBU
2.5000 mg | INHALATION_SOLUTION | Freq: Two times a day (BID) | RESPIRATORY_TRACT | Status: DC
  Administered 2019-07-14 – 2019-07-15 (×2): 2.5 mg via RESPIRATORY_TRACT
  Filled 2019-07-14 (×2): qty 3

## 2019-07-14 NOTE — Plan of Care (Signed)
  Problem: Education: Goal: Knowledge of General Education information will improve Description: Including pain rating scale, medication(s)/side effects and non-pharmacologic comfort measures Outcome: Progressing   Problem: Health Behavior/Discharge Planning: Goal: Ability to manage health-related needs will improve Outcome: Progressing   Problem: Clinical Measurements: Goal: Respiratory complications will improve Outcome: Progressing   

## 2019-07-14 NOTE — Progress Notes (Signed)
  Subjective:  Pt seen at the bedside on rounds this AM. Sitting up in the bedside chair. The patient says she is feeling better and SOB has improved. She does endorse a productive cough still. The nebulizing treatment she received yesterday helped a little bit, but still has a squeezing/tight sensation in her chest. She is also concerned that her blood pressures. Pt says she has a 70 year old granddaughter and wants to live long enough to see her graduate from high school.   PCP moved to [place] so she needs a new doctor.   Objective:    Vital Signs (last 24 hours): Vitals:   07/13/19 2239 07/14/19 0026 07/14/19 0406 07/14/19 0812  BP: (!) 133/58 113/61 (!) 124/41 (!) 180/78  Pulse: 66 (!) 59 63 72  Resp:  20 20 18   Temp:  98.4 F (36.9 C) 97.8 F (36.6 C) 98.5 F (36.9 C)  TempSrc:  Oral Oral Oral  SpO2:  97% 98% 98%  Weight:   114.2 kg   Height:        Physical Exam: General Alert and answers questions appropriately, no acute distress  Cardiac Regular rate and rhythm, no murmurs, rubs, or gallops  Pulmonary Mild bilateral wheezes without rales/rhonchi  Extremities Trace peripheral edema    Assessment/Plan:   Principal Problem:   Dyspnea Active Problems:   Hypertensive urgency   Hypokalemia   Chronic diastolic CHF (congestive heart failure) ([place])  Patient is a 68 year old female with past medical history of hypertension, TIA, heart failure who presented with a 5-day history of cough, shortness of breath, headache.  # Heart failure with preserved ejection fraction: # Shortness of breath: Shortness of breath occurred in the setting of headache, congestion, runny nose. Symptoms likely secondary to viral upper respiratory tract infection.  Mild bilateral wheezing appreciated on exam may be secondary to asthma as patient has mild symptomatic improvement with breathing treatments.  CT chest yesterday did not show any opacities, effusions, no reported signs of COPD.  TTE from  July of this year does show diastolic dysfunction and there initially concerned that patient may be volume overloaded.  However, after IV Lasix 40 mg x 1 patient was without significant urine output and had a slight bump in creatinine to 1.34 from 1.15.  Plan is for discharge today with close PCP follow-up after repeat evaluation this afternoon.  # Hypertension: Blood pressure was initially elevated on presentation to 201/83.  Uncontrolled blood pressure may have been contributory to patient's headache, shortness of breath.  Patient home medications were restarted with improvement in blood pressure. * We will continue home blood pressure medications at discharge - amlodipine 10 mg daily, metoprolol 12.5 mg twice daily, clonidine 0.3 mg twice daily, valsartan 160 mg daily  Diet: Heart healty DVT Ppx: Lovenox 55 mg Q24HR Dispo: Anticipated discharge in approximately 0-1 days  Jeanmarie Hubert, MD 07/14/2019, 11:10 AM Pager: [phone]

## 2019-07-15 DIAGNOSIS — R0600 Dyspnea, unspecified: Secondary | ICD-10-CM | POA: Diagnosis not present

## 2019-07-15 DIAGNOSIS — I160 Hypertensive urgency: Secondary | ICD-10-CM | POA: Diagnosis not present

## 2019-07-15 LAB — CBC
HCT: 36.3 % (ref 36.0–46.0)
Hemoglobin: 11.6 g/dL — ABNORMAL LOW (ref 12.0–15.0)
MCH: 29.6 pg (ref 26.0–34.0)
MCHC: 32 g/dL (ref 30.0–36.0)
MCV: 92.6 fL (ref 80.0–100.0)
Platelets: 301 10*3/uL (ref 150–400)
RBC: 3.92 MIL/uL (ref 3.87–5.11)
RDW: 15.3 % (ref 11.5–15.5)
WBC: 8.4 10*3/uL (ref 4.0–10.5)
nRBC: 0 % (ref 0.0–0.2)

## 2019-07-15 MED ORDER — ALBUTEROL SULFATE (2.5 MG/3ML) 0.083% IN NEBU: 2.5000 mg | INHALATION_SOLUTION | Freq: Three times a day (TID) | RESPIRATORY_TRACT | Status: DC

## 2019-07-15 MED ORDER — ALBUTEROL SULFATE (2.5 MG/3ML) 0.083% IN NEBU
2.5000 mg | INHALATION_SOLUTION | Freq: Three times a day (TID) | RESPIRATORY_TRACT | Status: DC
  Administered 2019-07-15 – 2019-07-16 (×4): 2.5 mg via RESPIRATORY_TRACT
  Filled 2019-07-15 (×4): qty 3

## 2019-07-15 MED ORDER — INFLUENZA VAC A&B SA ADJ QUAD 0.5 ML IM PRSY
0.5000 mL | PREFILLED_SYRINGE | INTRAMUSCULAR | Status: AC
  Administered 2019-07-16: 0.5 mL via INTRAMUSCULAR
  Filled 2019-07-15 (×2): qty 0.5

## 2019-07-15 MED ORDER — PREDNISONE 20 MG PO TABS
40.0000 mg | ORAL_TABLET | Freq: Once | ORAL | Status: AC
  Administered 2019-07-15: 40 mg via ORAL
  Filled 2019-07-15 (×2): qty 2

## 2019-07-15 MED ORDER — PREDNISONE 20 MG PO TABS
40.0000 mg | ORAL_TABLET | Freq: Every day | ORAL | Status: DC
  Administered 2019-07-16: 40 mg via ORAL
  Filled 2019-07-15 (×2): qty 2

## 2019-07-15 NOTE — Evaluation (Signed)
Physical Therapy Evaluation Patient Details Name: Sherry Fitzpatrick MRN: [id number] DOB: [birth year] Today's Date: 07/15/2019   History of Present Illness  Pt is a 68 y/o female admitted secondary to increased SOB, thought to be secondary to CHF exacerbation. PMH includes HTN, TIA, and dCHF.   Clinical Impression  Pt admitted secondary to problem above with deficits below. Pt limited this session secondary to increased SOB, however, sats remained 96-98% on RA. Pt requiring min guard A for mobility within the room. Did report some dizziness secondary to increased SOB. Educated about using rollator for mobility to increase safety at d/c. Will continue to follow acutely to maximize functional mobility independence and safety.     Follow Up Recommendations Home health PT;Supervision for mobility/OOB    Equipment Recommendations  Other (comment)(rollator )    Recommendations for Other Services       Precautions / Restrictions Precautions Precautions: Fall Restrictions Weight Bearing Restrictions: No      Mobility  Bed Mobility               General bed mobility comments: Sitting EOB upon entry   Transfers Overall transfer level: Needs assistance Equipment used: None Transfers: Sit to/from Stand Sit to Stand: Min guard         General transfer comment: Min guard for safety. Increased time required to perform.   Ambulation/Gait Ambulation/Gait assistance: Min guard Gait Distance (Feet): 25 Feet Assistive device: None Gait Pattern/deviations: Step-through pattern;Decreased stride length Gait velocity: Decreased   General Gait Details: Slow, very cautious gait. Reaching out for surfaces in room to hold onto. Pt with increased SOB, however, oxygen sats at 96-98% on RA. Pt reports some dizziness secondary to SOB.   Stairs            Wheelchair Mobility    Modified Rankin (Stroke Patients Only)       Balance Overall balance assessment: Needs  assistance Sitting-balance support: No upper extremity supported;Feet supported Sitting balance-Leahy Scale: Fair     Standing balance support: No upper extremity supported;During functional activity Standing balance-Leahy Scale: Fair                               Pertinent Vitals/Pain Pain Assessment: No/denies pain    Home Living Family/patient expects to be discharged to:: Private residence Living Arrangements: Children Available Help at Discharge: Family;Available 24 hours/day Type of Home: House Home Access: Level entry     Home Layout: Multi-level Home Equipment: Walker - 2 wheels      Prior Function Level of Independence: Independent               Hand Dominance        Extremity/Trunk Assessment   Upper Extremity Assessment Upper Extremity Assessment: Defer to OT evaluation    Lower Extremity Assessment Lower Extremity Assessment: Generalized weakness    Cervical / Trunk Assessment Cervical / Trunk Assessment: Normal  Communication   Communication: No difficulties  Cognition Arousal/Alertness: Awake/alert Behavior During Therapy: WFL for tasks assessed/performed Overall Cognitive Status: Within Functional Limits for tasks assessed                                        General Comments      Exercises     Assessment/Plan    PT Assessment Patient needs continued PT services  PT Problem  List Decreased strength;Decreased activity tolerance;Decreased balance;Decreased mobility;Decreased knowledge of use of DME;Cardiopulmonary status limiting activity       PT Treatment Interventions DME instruction;Gait training;Functional mobility training;Therapeutic exercise;Balance training;Therapeutic activities;Patient/family education    PT Goals (Current goals can be found in the Care Plan section)  Acute Rehab PT Goals Patient Stated Goal: to be able to breathe better PT Goal Formulation: With patient Time For Goal  Achievement: 07/29/19 Potential to Achieve Goals: Good    Frequency Min 3X/week   Barriers to discharge        Co-evaluation               AM-PAC PT "6 Clicks" Mobility  Outcome Measure Help needed turning from your back to your side while in a flat bed without using bedrails?: A Little Help needed moving from lying on your back to sitting on the side of a flat bed without using bedrails?: A Little Help needed moving to and from a bed to a chair (including a wheelchair)?: A Little Help needed standing up from a chair using your arms (e.g., wheelchair or bedside chair)?: A Little Help needed to walk in hospital room?: A Little Help needed climbing 3-5 steps with a railing? : A Lot 6 Click Score: 17    End of Session Equipment Utilized During Treatment: Gait belt Activity Tolerance: Patient limited by fatigue Patient left: in bed;with call bell/phone within reach(sitting EOB ) Nurse Communication: Mobility status PT Visit Diagnosis: Other abnormalities of gait and mobility (R26.89);Muscle weakness (generalized) (M62.81)    Time: 7494-4967 PT Time Calculation (min) (ACUTE ONLY): 18 min   Charges:   PT Evaluation $PT Eval Moderate Complexity: Robards, PT, DPT  Acute Rehabilitation Services  Pager: [phone] Office: [phone]   Rudean Hitt 07/15/2019, 10:01 AM

## 2019-07-15 NOTE — TOC Initial Note (Signed)
Transition of Care [facility]) - Initial/Assessment Note    Patient Details  Name: Sherry Fitzpatrick MRN: [id number] Date of Birth: 1951/03/29  [facility] CM/SW Contact:    Alberteen Sam, [place] Phone Number: [phone] 07/15/2019, 2:20 PM  Clinical Narrative:                  Patient declines [place] and DME at this time, reporting she does not need it. Patient very short with CSW And with RNCM.   Expected Discharge Plan: Home/Self Care Barriers to Discharge: Continued Medical Work up   Patient Goals and CMS Choice   CMS Medicare.gov Compare Post Acute Care list provided to:: Patient Choice offered to / list presented to : Patient  Expected Discharge Plan and Services Expected Discharge Plan: Home/Self Care       Living arrangements for the past 2 months: Single Family Home                 DME Arranged: Walker rolling with seat DME Agency: [employer] Date DME Agency Contacted: 07/15/19 Time DME Agency Contacted: 32 Representative spoke with at DME Agency: [place] Arrangements/Services Living arrangements for the past 2 months: [place] with:: Self, Adult Children Patient language and need for interpreter reviewed:: Yes Do you feel safe going back to the place where you live?: Yes      Need for Family Participation in Patient Care: Yes (Comment) Care giver support system in place?: Yes (comment)   Criminal Activity/Legal Involvement Pertinent to Current Situation/Hospitalization: No - Comment as needed  Activities of Daily Living Home Assistive Devices/Equipment: None ADL Screening (condition at time of admission) Patient's cognitive ability adequate to safely complete daily activities?: Yes Is the patient deaf or have difficulty hearing?: No Does the patient have difficulty seeing, even when wearing glasses/contacts?: No Does the patient have difficulty concentrating, remembering, or making decisions?:  No Patient able to express need for assistance with ADLs?: Yes Does the patient have difficulty dressing or bathing?: No Independently performs ADLs?: Yes (appropriate for developmental age) Does the patient have difficulty walking or climbing stairs?: No Weakness of Legs: Both Weakness of Arms/Hands: Both  Permission Sought/Granted Permission sought to share information with : Case Manager, Customer service manager, Family Supports Permission granted to share information with : Yes, Verbal Permission Granted     Permission granted to share info w AGENCY: Home Health        Emotional Assessment Appearance:: Appears stated age Attitude/Demeanor/Rapport: Gracious Affect (typically observed): Calm Orientation: : Oriented to Self, Oriented to Place, Oriented to  Time, Oriented to Situation Alcohol / Substance Use: Not Applicable Psych Involvement: No (comment)  Admission diagnosis:  SOB  CP  Patient Active Problem List   Diagnosis Date Noted  . Dyspnea 07/13/2019  . Acute ischemic stroke ([place]) 05/05/2019  . AKI (acute kidney injury) ([place]) 07/03/2018  . Hypertensive crisis 07/01/2018  . Tobacco abuse 07/01/2018  . Hypokalemia 07/01/2018  . Chronic diastolic CHF (congestive heart failure) ([place]) 07/01/2018  . Positive D dimer   . Hypertensive urgency 02/22/2018  . Malignant hypertension 02/20/2018  . Shortness of breath 02/20/2018  . Stroke ([place])   . Chest pain 04/14/2015  . Right sided weakness 04/14/2015  . Hyperlipidemia   . Body mass index 40.0-44.9, adult ([place])   . Essential hypertension   . Blood glucose elevated   . Asthma   . OSA (  obstructive sleep apnea) 05/06/2013   PCP:  Lilian Coma., MD Pharmacy:   [place] [address] Phone: [phone] Fax: [phone]     Social Determinants of Health (SDOH) Interventions    Readmission Risk Interventions No flowsheet  data found.

## 2019-07-15 NOTE — Progress Notes (Addendum)
  Subjective:  Pt seen at the bedside on rounds this AM. Patient says she is having more difficulty breathing this AM. Productive coughing during interview this AM. Says she coughed some blood streaks yesterday after we saw her yesterday. Says her last breathing treatment was around 7:00 PM yesterday.    Objective:    Vital Signs (last 24 hours): Vitals:   07/14/19 1946 07/14/19 2214 07/15/19 0023 07/15/19 0533  BP: (!) 171/82 (!) 131/44 117/64 127/77  Pulse: 69 70 (!) 56 61  Resp: 20  20 20   Temp: 98.2 F (36.8 C)  98.3 F (36.8 C) 98.4 F (36.9 C)  TempSrc: Oral  Oral Oral  SpO2: 94%  98% 98%  Weight:    115.5 kg  Height:        Physical Exam: General Alert and answers questions appropriately, no acute distress  Cardiac  regular rate and rhythm, no murmurs, rubs, or gallops  Pulmonary  mild bilateral wheezes, without rales, rhonchi  Extremities Trace peripheral edema    Assessment/Plan:   Principal Problem:   Dyspnea Active Problems:   Hypertensive urgency   Hypokalemia   Chronic diastolic CHF (congestive heart failure) ([place])  Patient is a 68 year old female with past medical history of hypertension, TIA, heart failure who presented with a 5-day history of cough, shortness of breath, headache.  # Heart failure with preserved ejection fraction: # Shortness of breath: Shortness of breath occurred in the setting of headache, congestion, runny nose. Symptoms likely secondary to viral upper respiratory tract infection.  Mild bilateral wheezing on exam slightly worse than yesterday - this may be secondary to asthma exacerbation versus post viral acute bronchitis.  CT chest yesterday did not show any opacities, effusions, no reported signs of COPD.  TTE from July of this year does show diastolic dysfunction and there initially concerned that patient may be volume overloaded.  However, after IV Lasix 40 mg x 1 patient was without significant urine output and had a slight bump in  creatinine to 1.34 from 1.15. *We will treat presumed asthma exacerbation with albuterol nebulizer, and start 5-day course of prednisone 40 mg daily *Patient evaluated physical therapy recommends home health PT.  Shortness of breath severely limited patient's exertional capacity but she maintained her saturations with 96-98% on room air.  # Hypertension: Blood pressure was initially elevated on presentation to 201/83.  Uncontrolled blood pressure may have been contributory to patient's headache, shortness of breath.  Patient home medications were restarted with improvement in blood pressure. * We will continue home blood pressure medications at discharge - amlodipine 10 mg daily, metoprolol 12.5 mg twice daily, clonidine 0.3 mg twice daily, valsartan 160 mg daily  Diet: Heart healty DVT Ppx: Lovenox 55 mg Q24HR Dispo: Anticipated discharge in approximately 0-1 days  Earlene Plater, MD 07/15/2019, 7:49 AM Pager: [phone]

## 2019-07-15 NOTE — Progress Notes (Signed)
Patient complains of dizziness when standing, will get orthostatic VS.

## 2019-07-15 NOTE — Plan of Care (Signed)
  Problem: Health Behavior/Discharge Planning: Goal: Ability to manage health-related needs will improve Outcome: Adequate for Discharge   Problem: Clinical Measurements: Goal: Respiratory complications will improve Outcome: Adequate for Discharge

## 2019-07-16 DIAGNOSIS — J45909 Unspecified asthma, uncomplicated: Secondary | ICD-10-CM

## 2019-07-16 DIAGNOSIS — J4541 Moderate persistent asthma with (acute) exacerbation: Secondary | ICD-10-CM | POA: Diagnosis not present

## 2019-07-16 DIAGNOSIS — R0600 Dyspnea, unspecified: Secondary | ICD-10-CM | POA: Diagnosis not present

## 2019-07-16 MED ORDER — METOPROLOL TARTRATE 25 MG PO TABS: 12.5000 mg | ORAL_TABLET | Freq: Two times a day (BID) | ORAL | 0 refills | Status: DC

## 2019-07-16 MED ORDER — PREDNISONE 20 MG PO TABS: 40.0000 mg | ORAL_TABLET | Freq: Every day | ORAL | 0 refills | Status: DC

## 2019-07-16 MED ORDER — POTASSIUM CHLORIDE ER 10 MEQ PO TBCR: 20.0000 meq | EXTENDED_RELEASE_TABLET | Freq: Every day | ORAL | 1 refills | Status: DC

## 2019-07-16 NOTE — Progress Notes (Signed)
Pt refused PIV insertion; pt stated, "I'm going home today, and that is in four hours.  I don't need an IV".  Pt's primary RN was made aware of pt's refusal.

## 2019-07-16 NOTE — Progress Notes (Signed)
PT Cancellation Note  Patient Details Name: Sherry Fitzpatrick MRN: [id number] DOB: 01/31/1951   Cancelled Treatment:    Reason Eval/Treat Not Completed: Patient declined, no reason specified(pt supine on arrival stating she doesn't feel like it. Explained role of therapy and pt abruptly jumped out of bed and started walking to door independently. Pt cued to stop as no socks, mask, or back gown and pt became agitated stated "I can walk" "I'm gonna do what I want". Pt then abruptly returned to bed, pulled covers and would not engage in further conversation.)   Shakai Dolley B Averi Kilty 07/16/2019, 8:35 AM  Elwyn Reach, [place] Pager: [phone] Office: [phone]

## 2019-07-16 NOTE — TOC Progression Note (Addendum)
Transition of Care [facility]) - Progression Note    Patient Details  Name: SIDRA OLDFIELD MRN: [id number] Date of Birth: 1951-04-10  Transition of Care [facility]) CM/SW [place], [place] Phone Number: [phone] 07/16/2019, 10:10 AM  Clinical Narrative:     Patient is set up with PCP appointment with [facility] for October 21 at 9:30 am with Dr. Lucia Gaskins. Information will be on AVS for discharge paperwork.   [facility] is out of rollators, patient informed and reports she will just go to [place].   Expected Discharge Plan: Home/Self Care Barriers to Discharge: Continued Medical Work up  Expected Discharge Plan and Services Expected Discharge Plan: Home/Self Care       Living arrangements for the past 2 months: Single Family Home                 DME Arranged: Walker rolling with seat DME Agency: [employer] Date DME Agency Contacted: 07/15/19 Time DME Agency Contacted: 0938 Representative spoke with at DME Agency: [place]             Social Determinants of Health ([place]) Interventions    Readmission Risk Interventions No flowsheet data found.

## 2019-07-16 NOTE — Progress Notes (Signed)
   Subjective:  She is feeling much better today and feels ready to go home. She denies shortness of breath, chest pain or difficulty with ambulation.   Objective:  Vital signs in last 24 hours: Vitals:   07/16/19 0633 07/16/19 0840 07/16/19 1119 07/16/19 1429  BP:   (!) 185/98   Pulse:  80 69 70  Resp:  20  18  Temp:      TempSrc:      SpO2:  98% 97% 99%  Weight: 116.3 kg     Height:       Constitution: NAD, sitting at bedside  Cardio: RRR, no m/r/g  Respiratory: CTA, no wheezing  Neuro: a&o, normal affect  Skin: c/d/i    Assessment/Plan:  Principal Problem:   Dyspnea Active Problems:   Hypertensive urgency   Hypokalemia   Chronic diastolic CHF (congestive heart failure) (HCC)   Reactive airway disease without complication   Reactive Airway Disease Viral etiology vs. Asthma exacerbation although no childhood history or past history. Symptoms have improved with prednisone and albuterol nebs. Ambulated yesterday with O2 sats >96% on RA.    - cont. Prednisone 40 mg 5 days qd  - cont. Albuterol nebs  - arranged outpatient follow-up to establish PCP   HTN Cont. Amlodipine 10mg  qd, metoprolol 12.5 mg bid, clonidine .3mg  bid, and valsartan 160 mg qd   VTE: lovenox IVF: none Diet: HH Code: full   Dispo: Anticipated discharge in approximately today.   Marty Heck, DO 07/16/2019, 5:23 PM Pager: [phone]

## 2019-07-16 NOTE — Evaluation (Signed)
Occupational Therapy Evaluation and Discharge Patient Details Name: Sherry Fitzpatrick MRN: [id number] DOB: 11/12/1950 Today's Date: 07/16/2019    History of Present Illness Pt is a 68 y/o female admitted secondary to increased SOB, thought to be secondary to CHF exacerbation. PMH includes HTN, TIA, and dCHF.    Clinical Impression   Pt has been routinely taking herself to the bathroom and demonstrated ability to perform ADL modified independently. Educated in energy conservation and recommended shower seat. No further OT needs.    Follow Up Recommendations  No OT follow up    Equipment Recommendations  None recommended by OT(pt will get a shower seat on her own)    Recommendations for Other Services       Precautions / Restrictions        Mobility Bed Mobility               General bed mobility comments: in chair  Transfers Overall transfer level: Modified independent Equipment used: None             General transfer comment: slow to rise,no assist    Balance                                           ADL either performed or assessed with clinical judgement   ADL Overall ADL's : Modified independent                                       General ADL Comments: Educated pt in energy conservation strategies and reinforced with handout.     Vision Patient Visual Report: No change from baseline       Perception     Praxis      Pertinent Vitals/Pain Pain Assessment: No/denies pain     Hand Dominance Right   Extremity/Trunk Assessment Upper Extremity Assessment Upper Extremity Assessment: Overall WFL for tasks assessed   Lower Extremity Assessment Lower Extremity Assessment: Defer to PT evaluation       Communication Communication Communication: No difficulties   Cognition Arousal/Alertness: Awake/alert Behavior During Therapy: WFL for tasks assessed/performed Overall Cognitive Status: Within Functional  Limits for tasks assessed                                     General Comments       Exercises     Shoulder Instructions      Home Living Family/patient expects to be discharged to:: Private residence Living Arrangements: Children Available Help at Discharge: Family;Available 24 hours/day Type of Home: House Home Access: Level entry     Home Layout: Multi-level Alternate Level Stairs-Number of Steps: 2 Alternate Level Stairs-Rails: None Bathroom Shower/Tub: Teacher, early years/pre: Standard     Home Equipment: Environmental consultant - 2 wheels;Bedside commode          Prior Functioning/Environment Level of Independence: Independent        Comments: daughter does most housework and heavy meal prep        OT Problem List:        OT Treatment/Interventions:      OT Goals(Current goals can be found in the care plan section) Acute Rehab OT Goals Patient Stated Goal: go home  OT Frequency:     Barriers to D/C:            Co-evaluation              AM-PAC OT "6 Clicks" Daily Activity     Outcome Measure Help from another person eating meals?: None Help from another person taking care of personal grooming?: None Help from another person toileting, which includes using toliet, bedpan, or urinal?: None Help from another person bathing (including washing, rinsing, drying)?: None Help from another person to put on and taking off regular upper body clothing?: None Help from another person to put on and taking off regular lower body clothing?: None 6 Click Score: 24   End of Session    Activity Tolerance: Patient tolerated treatment well Patient left: in chair;with call bell/phone within reach  OT Visit Diagnosis: Other (comment)(decreased activity tolerance)                Time: 1000-1017 OT Time Calculation (min): 17 min Charges:  OT General Charges $OT Visit: 1 Visit OT Evaluation $OT Eval Low Complexity: 1 Low  Nestor Lewandowsky,  OTR/L Acute Rehabilitation Services Pager: [phone] Office: [phone] Malka So 07/16/2019, 10:18 AM

## 2019-07-16 NOTE — Discharge Summary (Addendum)
Name: Sherry Fitzpatrick MRN: [id number] DOB: 03-31-1951 68 y.o. PCP: Lilian Coma., MD  Date of Admission: 07/13/2019  6:36 AM Date of Discharge:  Attending Physician: No att. providers found  Discharge Diagnosis: 1. Asthma Exacerbation   Discharge Medications: Allergies as of 07/16/2019      Reactions   Morphine And Related Hives, Nausea And Vomiting   Penicillins Hives   Has patient had a PCN reaction causing immediate rash, facial/tongue/throat swelling, SOB or lightheadedness with hypotension: Yes Has patient had a PCN reaction causing severe rash involving mucus membranes or skin necrosis: No Has patient had a PCN reaction that required hospitalization: No Has patient had a PCN reaction occurring within the last 10 years: No If all of the above answers are "NO", then may proceed with Cephalosporin use.   Percocet [oxycodone-acetaminophen] Nausea And Vomiting   Ace Inhibitors Nausea And Vomiting, Cough   Iodinated Diagnostic Agents Nausea And Vomiting      Iodine Itching   Etodolac Nausea And Vomiting   Tramadol Nausea And Vomiting   Vicodin [hydrocodone-acetaminophen] Nausea And Vomiting      Medication List    STOP taking these medications   dextromethorphan-guaiFENesin 30-600 MG 12hr tablet Commonly known as: MUCINEX DM   metoprolol succinate 25 MG 24 hr tablet Commonly known as: TOPROL-XL   OVER THE COUNTER MEDICATION     TAKE these medications   acetaminophen 500 MG tablet Commonly known as: TYLENOL Take 1,000 mg by mouth every 6 (six) hours as needed for headache (pain).   albuterol 108 (90 Base) MCG/ACT inhaler Commonly known as: VENTOLIN HFA Inhale 2 puffs into the lungs every 6 (six) hours as needed for wheezing. What changed: reasons to take this   amLODipine 10 MG tablet Commonly known as: NORVASC Take 1 tablet (10 mg total) by mouth daily.   atorvastatin 80 MG tablet Commonly known as: LIPITOR Take 1 tablet (80 mg total) by mouth daily. What  changed: when to take this   cloNIDine 0.3 MG tablet Commonly known as: CATAPRES Take 1 tablet (0.3 mg total) by mouth 2 (two) times daily.   clopidogrel 75 MG tablet Commonly known as: PLAVIX Take 1 tablet (75 mg total) by mouth daily.   furosemide 40 MG tablet Commonly known as: LASIX Take 1 tablet (40 mg total) by mouth 2 (two) times daily. What changed: when to take this   metoprolol tartrate 25 MG tablet Commonly known as: LOPRESSOR Take 0.5 tablets (12.5 mg total) by mouth 2 (two) times daily.   potassium chloride 10 MEQ tablet Commonly known as: KLOR-CON Take 2 tablets (20 mEq total) by mouth daily.   predniSONE 20 MG tablet Commonly known as: DELTASONE Take 2 tablets (40 mg total) by mouth daily with breakfast. For 3 days.   valsartan 160 MG tablet Commonly known as: DIOVAN Take 1 tablet (160 mg total) by mouth daily.       Disposition and follow-up:   Sherry Fitzpatrick was discharged from [facility] in stable condition.  At the hospital follow up visit please address:  1.  Further evaluation of asthma   2.  Labs / imaging needed at time of follow-up: Basic Metabolic Panel and Pulmonary Function Testing  3.  Pending labs/ test needing follow-up: none  Follow-up Appointments: Follow-up Information    [place] Go on 08/05/2019.   Why: You have an appointment with Dr. Lucia Gaskins at [facility] for October 21  at 9:30 am.  Contact information: [place] [id number] [phone]          Hospital Course by problem list: # Shortness of breath: Shortness of breath with associated headache and rhinorrhea.  Symptoms likely secondary to viral upper respiratory tract infection, but cannot exclude asthma exacerbation due to remote history of asthma and bilateral wheezing on exam.  CT chest was unremarkable for new infiltrates , opacities, or effusions. No signs of  COPD and patient denies smoking history.   Patient has a history of diastolic heart failure on TTE last July and showed signs concerning for volume overload. She was given single dose of Furosemide 40 mg IV without significant urine output or resolution of her symptoms. She die however, experience an acute increase in creatinine and Furosemide was discontinued. Patient was able to maintain O2 saturations > 96% on room air prior to being discharged.  Patient was treated for asthma exacerbation with albuterol nebulizer and prednisone 40 mg for 5 days. Patient was advised to follow up with her PCP for PFTs and further evaluation of her presumed asthma. Patient was evaluated physical therapy who recommended home health PT, patient declined home health physical therapy as she reports that daughter is a physical therapist and is able to provide her the necessary therapy.  # Hypertension: Patient presented with elevation in blood pressure as high as 201/83. She states that she has been out of her BP medications. Subsequently was restarted on home blood pressure medications, which improved her blood pressure  patient had run out of home medications. Patient's home medications were restarted with improvement in blood pressure. Blood pressure medications consist of amlodipine 10 mg daily, metoprolol 12.5 mg twice daily, clonidine 0.3 mg twice daily, valsartan 160 mg daily.      Discharge Vitals:   BP (!) 185/98 (BP Location: Right Arm)   Pulse 70   Temp 98.1 F (36.7 C) (Oral)   Resp 18   Ht 5\' 3"  (1.6 m)   Wt 116.3 kg   SpO2 99%   BMI 45.42 kg/m   Pertinent Labs, Studies, and Procedures:  CMP Latest Ref Rng & Units 07/14/2019 07/13/2019 05/06/2019  Glucose 70 - 99 mg/dL 166(H) 136(H) 127(H)  BUN 8 - 23 mg/dL 24(H) 12 18  Creatinine 0.44 - 1.00 mg/dL 1.34(H) 1.15(H) 1.26(H)  Sodium 135 - 145 mmol/L 138 139 141  Potassium 3.5 - 5.1 mmol/L 3.7 3.2(L) 3.3(L)  Chloride 98 - 111 mmol/L 103 102 103  CO2  22 - 32 mmol/L 25 26 29   Calcium 8.9 - 10.3 mg/dL 8.5(L) 8.5(L) 8.7(L)  Total Protein 6.5 - 8.1 g/dL 7.1 - -  Total Bilirubin 0.3 - 1.2 mg/dL 0.4 - -  Alkaline Phos 38 - 126 U/L 67 - -  AST 15 - 41 U/L 24 - -  ALT 0 - 44 U/L 17 - -   CBC Latest Ref Rng & Units 07/15/2019 07/14/2019 07/13/2019  WBC 4.0 - 10.5 K/uL 8.4 12.4(H) 6.7  Hemoglobin 12.0 - 15.0 g/dL 11.6(L) 11.9(L) 12.3  Hematocrit 36.0 - 46.0 % 36.3 35.3(L) 36.3  Platelets 150 - 400 K/uL 301 301 299   CT Chest: Cardiomegaly, coronary artery disease. Borderline size mediastinal lymph nodes are stable since prior study, likely reactive. No confluent opacities, effusions or edema. Aortic Atherosclerosis   Discharge Instructions: Discharge Instructions    Diet - low sodium heart healthy   Complete by: As directed    Diet - low sodium  heart healthy   Complete by: As directed    Discharge instructions   Complete by: As directed    You were hospitalized for upper respiratory infection. Thank you for allowing [place] to be part of your care.   We arranged for you to follow up at:  [facility] for October 21 at 9:30 am.  Contact information: [address] [place] [address] [phone]   Please note these changes made to your medications:   Please START taking:   1. Prednisone 20 mg, twice daily with breakfast for 3 days. (Total of 40 mg each day) 2. Continue taking you Albuterol inhaler as needed for shortness of breath 3. Potassium chloride take two 20 MEQ tablets daily. (For total of 40 MEQ daily) 4. Continue taking home medication: Atorvastatin, Clopidogrel, Amlodipine, Clonidine, Furosemide, Valsartan, Metoprolol as prescribed.   Please STOP taking: none   Please make sure to follow up with you primary care appointment at the [facility] on Oct. 21st. It is very important for you to keep this appointment to further investigate your history of asthma.    Please call our clinic if you have any questions or concerns, we may be able to help and keep you from a long and expensive emergency room wait. Our clinic and after hours phone number is [phone], the best time to call is Monday through Friday 9 am to 4 pm but there is always someone available 24/7 if you have an emergency. If you need medication refills please notify your pharmacy one week in advance and they will send [place] a request.   Discharge instructions   Complete by: As directed    Discharge instructions  You were hospitalized for upper respiratory infection. Thank you for allowing [place] to be part of your care.   We arranged for you to follow up at:   [facility] for October 21 at 9:30 am.  Contact information:  [address]  [place] [address]  [phone]    Please note these changes made to your medications:   Please START taking:   1. Prednisone 20 mg, twice daily with breakfast for 3 days. (Total of 40 mg each day)  2. Continue taking you Albuterol inhaler as needed for shortness of breath  3. Potassium chloride take two 20 MEQ tablets daily. (For total of 40 MEQ daily)  4. Continue taking home medication: Atorvastatin, Clopidogrel, Amlodipine, Clonidine, Furosemide, Valsartan, Metoprolol as prescribed.   Please STOP taking: none    Please make sure to follow up with you primary care appointment at the [facility] on Oct. 21st. It is very important for you to keep this appointment to further investigate your history of asthma.   Please call our clinic if you have any questions or concerns, we may be able to help and keep you from a long and expensive emergency room wait. Our clinic and after hours phone number is [phone], the best time to call is Monday through Friday 9 am to 4 pm but there is always someone available 24/7 if you have an emergency. If you need medication refills please notify your  pharmacy one week in advance and they will send [place] a request.   Discharge instructions  You were hospitalized for upper respiratory infection. Thank you for allowing [place] to be part of your care.   We arranged for you to follow up at:   [facility]  [facility] for October 21 at 9:30 am.  Contact information:  [address]  [place] [address]  [phone]    Please note these changes made to your medications:   Please START taking:   1. Prednisone 20 mg, twice daily with breakfast for 3 days. (Total of 40 mg each day)  2. Continue taking you Albuterol inhaler as needed for shortness of breath  3. Potassium chloride take two 20 MEQ tablets daily. (For total of 40 MEQ daily)  4. Metoprolol 12.5 mg twice daily. (this is a change form you past medication) 5. Continue taking home medication: Atorvastatin, Clopidogrel, Amlodipine, Clonidine, Furosemide, Valsartan.   Please STOP taking: none    Please make sure to follow up with you primary care appointment at the [facility] on Oct. 21st. It is very important for you to keep this appointment to further investigate your history of asthma.   Please call our clinic if you have any questions or concerns, we may be able to help and keep you from a long and expensive emergency room wait. Our clinic and after hours phone number is [phone], the best time to call is Monday through Friday 9 am to 4 pm but there is always someone available 24/7 if you have an emergency. If you need medication refills please notify your pharmacy one week in advance and they will send [place] a request.   Increase activity slowly   Complete by: As directed    Increase activity slowly   Complete by: As directed       - Continue taking prednisone 40mg   for 5 days. - Continue home medications, including blood pressure medications stated above. - Follow up with PCP in the next couple of weeks.  Signed: Marianna Payment, MD 07/18/2019, 6:58 PM   Pager: [phone]

## 2019-07-24 DIAGNOSIS — E785 Hyperlipidemia, unspecified: Secondary | ICD-10-CM | POA: Diagnosis not present

## 2019-07-24 DIAGNOSIS — R0789 Other chest pain: Secondary | ICD-10-CM | POA: Diagnosis not present

## 2019-07-24 DIAGNOSIS — I10 Essential (primary) hypertension: Secondary | ICD-10-CM | POA: Diagnosis not present

## 2019-08-05 ENCOUNTER — Inpatient Hospital Stay: Payer: Medicare HMO | Admitting: Family Medicine

## 2019-08-17 ENCOUNTER — Ambulatory Visit: Payer: Medicare HMO | Admitting: Family Medicine

## 2019-08-24 ENCOUNTER — Telehealth: Payer: Self-pay

## 2019-08-24 NOTE — Telephone Encounter (Signed)
Pt. daughter is aware that her mom can not reschedule here since she no showed to her new pt. Apt.
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
## 2019-08-24 NOTE — Telephone Encounter (Signed)
Per office protocol, we will not be able to reschedule her.

## 2019-10-28 DIAGNOSIS — Z6841 Body Mass Index (BMI) 40.0 and over, adult: Secondary | ICD-10-CM | POA: Diagnosis not present

## 2019-10-28 DIAGNOSIS — M25552 Pain in left hip: Secondary | ICD-10-CM | POA: Diagnosis not present

## 2019-10-28 DIAGNOSIS — M1612 Unilateral primary osteoarthritis, left hip: Secondary | ICD-10-CM | POA: Diagnosis not present

## 2019-10-28 DIAGNOSIS — Z96641 Presence of right artificial hip joint: Secondary | ICD-10-CM | POA: Diagnosis not present

## 2019-10-29 ENCOUNTER — Other Ambulatory Visit: Payer: Self-pay

## 2019-10-29 ENCOUNTER — Encounter (HOSPITAL_COMMUNITY): Payer: Self-pay

## 2019-10-29 ENCOUNTER — Emergency Department (HOSPITAL_COMMUNITY): Payer: Medicare HMO

## 2019-10-29 ENCOUNTER — Observation Stay (HOSPITAL_COMMUNITY)
Admission: EM | Admit: 2019-10-29 | Discharge: 2019-10-31 | Disposition: A | Discharge status: Home / Self Care | Payer: Medicare HMO | Attending: Internal Medicine | Admitting: Internal Medicine

## 2019-10-29 DIAGNOSIS — N1831 Chronic kidney disease, stage 3a: Secondary | ICD-10-CM | POA: Diagnosis not present

## 2019-10-29 DIAGNOSIS — M25512 Pain in left shoulder: Secondary | ICD-10-CM | POA: Insufficient documentation

## 2019-10-29 DIAGNOSIS — M79646 Pain in unspecified finger(s): Secondary | ICD-10-CM | POA: Diagnosis not present

## 2019-10-29 DIAGNOSIS — E7800 Pure hypercholesterolemia, unspecified: Secondary | ICD-10-CM | POA: Insufficient documentation

## 2019-10-29 DIAGNOSIS — I5033 Acute on chronic diastolic (congestive) heart failure: Secondary | ICD-10-CM | POA: Insufficient documentation

## 2019-10-29 DIAGNOSIS — I209 Angina pectoris, unspecified: Secondary | ICD-10-CM

## 2019-10-29 DIAGNOSIS — N183 Chronic kidney disease, stage 3 unspecified: Secondary | ICD-10-CM | POA: Insufficient documentation

## 2019-10-29 DIAGNOSIS — M79602 Pain in left arm: Secondary | ICD-10-CM | POA: Diagnosis not present

## 2019-10-29 DIAGNOSIS — I5032 Chronic diastolic (congestive) heart failure: Secondary | ICD-10-CM | POA: Diagnosis not present

## 2019-10-29 DIAGNOSIS — R252 Cramp and spasm: Secondary | ICD-10-CM | POA: Diagnosis not present

## 2019-10-29 DIAGNOSIS — E785 Hyperlipidemia, unspecified: Secondary | ICD-10-CM | POA: Diagnosis not present

## 2019-10-29 DIAGNOSIS — R7989 Other specified abnormal findings of blood chemistry: Secondary | ICD-10-CM | POA: Insufficient documentation

## 2019-10-29 DIAGNOSIS — K219 Gastro-esophageal reflux disease without esophagitis: Secondary | ICD-10-CM | POA: Insufficient documentation

## 2019-10-29 DIAGNOSIS — I10 Essential (primary) hypertension: Secondary | ICD-10-CM

## 2019-10-29 DIAGNOSIS — Z96641 Presence of right artificial hip joint: Secondary | ICD-10-CM | POA: Insufficient documentation

## 2019-10-29 DIAGNOSIS — Z6841 Body Mass Index (BMI) 40.0 and over, adult: Secondary | ICD-10-CM | POA: Diagnosis not present

## 2019-10-29 DIAGNOSIS — I130 Hypertensive heart and chronic kidney disease with heart failure and stage 1 through stage 4 chronic kidney disease, or unspecified chronic kidney disease: Secondary | ICD-10-CM | POA: Diagnosis not present

## 2019-10-29 DIAGNOSIS — Z20822 Contact with and (suspected) exposure to covid-19: Secondary | ICD-10-CM | POA: Diagnosis not present

## 2019-10-29 DIAGNOSIS — G4733 Obstructive sleep apnea (adult) (pediatric): Secondary | ICD-10-CM | POA: Insufficient documentation

## 2019-10-29 DIAGNOSIS — I25119 Atherosclerotic heart disease of native coronary artery with unspecified angina pectoris: Secondary | ICD-10-CM | POA: Diagnosis not present

## 2019-10-29 DIAGNOSIS — R011 Cardiac murmur, unspecified: Secondary | ICD-10-CM | POA: Insufficient documentation

## 2019-10-29 DIAGNOSIS — Z885 Allergy status to narcotic agent status: Secondary | ICD-10-CM | POA: Insufficient documentation

## 2019-10-29 DIAGNOSIS — Z8249 Family history of ischemic heart disease and other diseases of the circulatory system: Secondary | ICD-10-CM | POA: Insufficient documentation

## 2019-10-29 DIAGNOSIS — Z833 Family history of diabetes mellitus: Secondary | ICD-10-CM | POA: Insufficient documentation

## 2019-10-29 DIAGNOSIS — R0789 Other chest pain: Principal | ICD-10-CM | POA: Insufficient documentation

## 2019-10-29 DIAGNOSIS — Z7901 Long term (current) use of anticoagulants: Secondary | ICD-10-CM | POA: Insufficient documentation

## 2019-10-29 DIAGNOSIS — R0602 Shortness of breath: Secondary | ICD-10-CM | POA: Diagnosis not present

## 2019-10-29 DIAGNOSIS — R918 Other nonspecific abnormal finding of lung field: Secondary | ICD-10-CM | POA: Insufficient documentation

## 2019-10-29 DIAGNOSIS — J45909 Unspecified asthma, uncomplicated: Secondary | ICD-10-CM | POA: Insufficient documentation

## 2019-10-29 DIAGNOSIS — R079 Chest pain, unspecified: Secondary | ICD-10-CM

## 2019-10-29 DIAGNOSIS — Z888 Allergy status to other drugs, medicaments and biological substances status: Secondary | ICD-10-CM | POA: Insufficient documentation

## 2019-10-29 DIAGNOSIS — I5030 Unspecified diastolic (congestive) heart failure: Secondary | ICD-10-CM | POA: Diagnosis present

## 2019-10-29 DIAGNOSIS — I160 Hypertensive urgency: Secondary | ICD-10-CM | POA: Diagnosis not present

## 2019-10-29 DIAGNOSIS — Z880 Allergy status to penicillin: Secondary | ICD-10-CM | POA: Insufficient documentation

## 2019-10-29 DIAGNOSIS — Z8673 Personal history of transient ischemic attack (TIA), and cerebral infarction without residual deficits: Secondary | ICD-10-CM | POA: Insufficient documentation

## 2019-10-29 DIAGNOSIS — E876 Hypokalemia: Secondary | ICD-10-CM

## 2019-10-29 DIAGNOSIS — R739 Hyperglycemia, unspecified: Secondary | ICD-10-CM

## 2019-10-29 DIAGNOSIS — Z79899 Other long term (current) drug therapy: Secondary | ICD-10-CM | POA: Insufficient documentation

## 2019-10-29 DIAGNOSIS — Z91041 Radiographic dye allergy status: Secondary | ICD-10-CM | POA: Insufficient documentation

## 2019-10-29 DIAGNOSIS — E1122 Type 2 diabetes mellitus with diabetic chronic kidney disease: Secondary | ICD-10-CM | POA: Insufficient documentation

## 2019-10-29 DIAGNOSIS — F17210 Nicotine dependence, cigarettes, uncomplicated: Secondary | ICD-10-CM | POA: Diagnosis not present

## 2019-10-29 DIAGNOSIS — R072 Precordial pain: Secondary | ICD-10-CM | POA: Diagnosis not present

## 2019-10-29 DIAGNOSIS — R0989 Other specified symptoms and signs involving the circulatory and respiratory systems: Secondary | ICD-10-CM | POA: Insufficient documentation

## 2019-10-29 DIAGNOSIS — N1832 Chronic kidney disease, stage 3b: Secondary | ICD-10-CM | POA: Diagnosis present

## 2019-10-29 DIAGNOSIS — Z7952 Long term (current) use of systemic steroids: Secondary | ICD-10-CM | POA: Insufficient documentation

## 2019-10-29 DIAGNOSIS — R9431 Abnormal electrocardiogram [ECG] [EKG]: Secondary | ICD-10-CM | POA: Insufficient documentation

## 2019-10-29 LAB — URINALYSIS, ROUTINE W REFLEX MICROSCOPIC
Bilirubin Urine: NEGATIVE
Glucose, UA: NEGATIVE mg/dL
Hgb urine dipstick: NEGATIVE
Ketones, ur: NEGATIVE mg/dL
Leukocytes,Ua: NEGATIVE
Nitrite: NEGATIVE
Protein, ur: 30 mg/dL — AB
Specific Gravity, Urine: 1.018 (ref 1.005–1.030)
pH: 7 (ref 5.0–8.0)

## 2019-10-29 LAB — BASIC METABOLIC PANEL
Anion gap: 11 (ref 5–15)
BUN: 14 mg/dL (ref 8–23)
CO2: 27 mmol/L (ref 22–32)
Calcium: 8.7 mg/dL — ABNORMAL LOW (ref 8.9–10.3)
Chloride: 103 mmol/L (ref 98–111)
Creatinine, Ser: 1.23 mg/dL — ABNORMAL HIGH (ref 0.44–1.00)
GFR calc Af Amer: 51 mL/min — ABNORMAL LOW (ref >60)
GFR calc non Af Amer: 44 mL/min — ABNORMAL LOW (ref >60)
Glucose, Bld: 111 mg/dL — ABNORMAL HIGH (ref 70–99)
Potassium: 3.3 mmol/L — ABNORMAL LOW (ref 3.5–5.1)
Sodium: 141 mmol/L (ref 135–145)

## 2019-10-29 LAB — CBC
HCT: 38 % (ref 36.0–46.0)
Hemoglobin: 12.3 g/dL (ref 12.0–15.0)
MCH: 28.9 pg (ref 26.0–34.0)
MCHC: 32.4 g/dL (ref 30.0–36.0)
MCV: 89.4 fL (ref 80.0–100.0)
Platelets: 320 10*3/uL (ref 150–400)
RBC: 4.25 MIL/uL (ref 3.87–5.11)
RDW: 14.6 % (ref 11.5–15.5)
WBC: 6.4 10*3/uL (ref 4.0–10.5)
nRBC: 0 % (ref 0.0–0.2)

## 2019-10-29 LAB — TROPONIN I (HIGH SENSITIVITY)
Troponin I (High Sensitivity): 11 ng/L (ref <18)
Troponin I (High Sensitivity): 10 ng/L (ref <18)

## 2019-10-29 LAB — SARS CORONAVIRUS 2 (TAT 6-24 HRS): SARS Coronavirus 2: NEGATIVE

## 2019-10-29 LAB — BRAIN NATRIURETIC PEPTIDE: B Natriuretic Peptide: 45.3 pg/mL (ref 0.0–100.0)

## 2019-10-29 MED ORDER — DIPHENHYDRAMINE HCL 50 MG/ML IJ SOLN: 50.0000 mg | Freq: Once | INTRAMUSCULAR | Status: AC

## 2019-10-29 MED ORDER — METOPROLOL TARTRATE 12.5 MG HALF TABLET
12.5000 mg | ORAL_TABLET | Freq: Two times a day (BID) | ORAL | Status: DC
  Administered 2019-10-29 – 2019-10-31 (×4): 12.5 mg via ORAL
  Filled 2019-10-29 (×4): qty 1

## 2019-10-29 MED ORDER — POTASSIUM CHLORIDE CRYS ER 10 MEQ PO TBCR
20.0000 meq | EXTENDED_RELEASE_TABLET | Freq: Every day | ORAL | Status: DC
  Administered 2019-10-30 – 2019-10-31 (×2): 20 meq via ORAL
  Filled 2019-10-29 (×4): qty 2

## 2019-10-29 MED ORDER — FUROSEMIDE 10 MG/ML IJ SOLN: 40.0000 mg | Freq: Two times a day (BID) | INTRAMUSCULAR | Status: DC

## 2019-10-29 MED ORDER — HYDRALAZINE HCL 20 MG/ML IJ SOLN: 10.0000 mg | INTRAMUSCULAR | Status: DC | PRN

## 2019-10-29 MED ORDER — SODIUM CHLORIDE 0.9% FLUSH
3.0000 mL | Freq: Once | INTRAVENOUS | Status: AC
  Administered 2019-10-29: 15:00:00 3 mL via INTRAVENOUS

## 2019-10-29 MED ORDER — CLONIDINE HCL 0.3 MG PO TABS
0.3000 mg | ORAL_TABLET | Freq: Two times a day (BID) | ORAL | Status: DC
  Administered 2019-10-29 – 2019-10-31 (×4): 0.3 mg via ORAL
  Filled 2019-10-29 (×4): qty 1

## 2019-10-29 MED ORDER — DIPHENHYDRAMINE HCL 25 MG PO CAPS: 50.0000 mg | ORAL_CAPSULE | Freq: Once | ORAL | Status: DC

## 2019-10-29 MED ORDER — FENTANYL CITRATE (PF) 100 MCG/2ML IJ SOLN
25.0000 ug | Freq: Once | INTRAMUSCULAR | Status: AC
  Administered 2019-10-29: 25 ug via INTRAVENOUS
  Filled 2019-10-29: qty 2

## 2019-10-29 MED ORDER — DIPHENHYDRAMINE HCL 50 MG/ML IJ SOLN: 50.0000 mg | Freq: Once | INTRAMUSCULAR | Status: DC

## 2019-10-29 MED ORDER — AMLODIPINE BESYLATE 5 MG PO TABS
5.0000 mg | ORAL_TABLET | Freq: Every day | ORAL | Status: DC
  Administered 2019-10-29 – 2019-10-30 (×2): 5 mg via ORAL
  Filled 2019-10-29 (×2): qty 1

## 2019-10-29 MED ORDER — PREDNISONE 50 MG PO TABS
50.0000 mg | ORAL_TABLET | Freq: Four times a day (QID) | ORAL | Status: AC
  Administered 2019-10-29 – 2019-10-30 (×3): 50 mg via ORAL
  Filled 2019-10-29 (×3): qty 1

## 2019-10-29 MED ORDER — ONDANSETRON HCL 4 MG/2ML IJ SOLN: 4.0000 mg | Freq: Four times a day (QID) | INTRAMUSCULAR | Status: DC | PRN

## 2019-10-29 MED ORDER — PREDNISONE 50 MG PO TABS: 50.0000 mg | ORAL_TABLET | Freq: Four times a day (QID) | ORAL | Status: DC

## 2019-10-29 MED ORDER — DIPHENHYDRAMINE HCL 25 MG PO CAPS
50.0000 mg | ORAL_CAPSULE | Freq: Once | ORAL | Status: AC
  Administered 2019-10-30: 50 mg via ORAL
  Filled 2019-10-29: qty 2

## 2019-10-29 MED ORDER — ALUM & MAG HYDROXIDE-SIMETH 200-200-20 MG/5ML PO SUSP: 30.0000 mL | Freq: Four times a day (QID) | ORAL | Status: DC | PRN

## 2019-10-29 MED ORDER — FUROSEMIDE 10 MG/ML IJ SOLN: 40.0000 mg | Freq: Once | INTRAMUSCULAR | Status: DC

## 2019-10-29 MED ORDER — NITROGLYCERIN 2 % TD OINT
1.0000 [in_us] | TOPICAL_OINTMENT | Freq: Once | TRANSDERMAL | Status: AC
  Administered 2019-10-29: 1 [in_us] via TOPICAL
  Filled 2019-10-29: qty 1

## 2019-10-29 MED ORDER — ENOXAPARIN SODIUM 40 MG/0.4ML ~~LOC~~ SOLN
40.0000 mg | SUBCUTANEOUS | Status: DC
  Administered 2019-10-29 – 2019-10-30 (×2): 40 mg via SUBCUTANEOUS
  Filled 2019-10-29 (×2): qty 0.4

## 2019-10-29 MED ORDER — IRBESARTAN 300 MG PO TABS
300.0000 mg | ORAL_TABLET | Freq: Every day | ORAL | Status: DC
  Administered 2019-10-29 – 2019-10-31 (×3): 300 mg via ORAL
  Filled 2019-10-29 (×3): qty 1

## 2019-10-29 MED ORDER — ACETAMINOPHEN 325 MG PO TABS: 650.0000 mg | ORAL_TABLET | ORAL | Status: DC | PRN

## 2019-10-29 MED ORDER — CLOPIDOGREL BISULFATE 75 MG PO TABS
75.0000 mg | ORAL_TABLET | Freq: Every day | ORAL | Status: DC
  Administered 2019-10-29 – 2019-10-31 (×3): 75 mg via ORAL
  Filled 2019-10-29 (×3): qty 1

## 2019-10-29 MED ORDER — POTASSIUM CHLORIDE CRYS ER 20 MEQ PO TBCR
40.0000 meq | EXTENDED_RELEASE_TABLET | ORAL | Status: AC
  Administered 2019-10-29: 15:00:00 40 meq via ORAL
  Filled 2019-10-29: qty 2

## 2019-10-29 NOTE — Consult Note (Addendum)
Cardiology Consultation:   Patient ID: Sherry Fitzpatrick MRN: [id number]; DOB: 09-Apr-1951  Admit date: 10/29/2019 Date of Consult: 10/29/2019  Primary Care Provider: Patient, No Pcp Per Primary Cardiologist: No primary care provider on file.  Primary Electrophysiologist:  None    Patient Profile:   Sherry Fitzpatrick is a 69 y.o. female with a hx of CVA 2004 and 2011, hypertension, hypercholesterolemia, chronic diastolic heart failure and obesity who is being seen today for the evaluation of chest pain at the request of Dr Kathrynn Humble.  History of Present Illness:   Sherry Fitzpatrick has been seen by heart care in the past during admissions.  She was seen first in 2012 by Dr. Lovena Le atypical chest pain during an admission for right total hip replacement.  At time she reported a negative Myoview 5 years prior. No further testing was pursued at that time.   The patient was last seen on 02/24/2018 Sherry Fitzpatrick for a Myoview.  However, the patient had wheezing on exam and it was canceled.   Her last echo was 05/06/2019 showing an EF of 55 to 60%, calcification of the aortic valve.  Patient reports that she saw Dr. Terrence Dupont about 6 weeks ago and he refilled her medication.  Report is not available in epic.  The patient presented to the ED on 10/29/2019 for chest pain.  Patient had 2 episodes of chest pain both waking her up in the middle the night.  The first was 2 nights ago.  She experienced sudden onset chest pain that was pressure-like in nature.  Was middle to left-sided. 10 out of 10 with associated shortness of breath radiating into her left shoulder.  Decided to not go to the ED.  Next night she had a similar experience.  The same kind of pain woke the patient up from sleep this time it was less severe.  The patient and her daughter called EMS.  Administered 4 baby aspirin and 2 nitroglycerin which only minimally relieved her pain.  EMS reported high blood pressures as well.    In the ED BP 160/77, pulse 65,  afebrile, respiratory rate 16, 96% O2.  Labs showed potassium 3.3, glucose 111, creatinine 1.23, calcium 8.7.  WBC 6.4, hemoglobin 12.3. HS troponin 11.  EKG shows normal sinus rhythm, LVH, T wave inversions in inferolateral leads.  Chest x-ray showed unchanged cardiomegaly, mild diffuse pulmonary interstitial prominence which may reflect edema.  Patient was admitted for further work-up.  Patient denies tobacco or drug use.  She drinks once every 2 weeks.  She lives with her daughter.  She says she sometimes takes her blood pressure at home and it is often very high.  In my interview her blood pressure was 192/69.   Heart Pathway Score:  HEAR Score: 7  Past Medical History:  Diagnosis Date  . Alterations of sensations, late effect of cerebrovascular disease(438.6)   . Arthritis    "all over"  . Backache, unspecified   . Body mass index 40.0-44.9, adult ([place])   . Carpal tunnel syndrome   . Dizziness and giddiness   . Esophageal reflux   . Generalized pain   . Headache    "had alot til I had pituitary tumor removed"  . Heart murmur   . Other abnormal glucose   . Other and unspecified hyperlipidemia   . Other malaise and fatigue   . Other postprocedural status(V45.89)   . Other symptoms involving cardiovascular system   . Sleep apnea    "did test; they  wanted me to get a CPAP but I never did get it" (04/14/2015)  . Stroke ([place]) 06/2003; 02/2010   "minor; minor" ; denies residual on 04/14/2015  . Unspecified disorder of the pituitary gland and its hypothalamic control   . Unspecified essential hypertension   . Unspecified visual disturbance     Past Surgical History:  Procedure Laterality Date  . CARPAL TUNNEL RELEASE Left ~ 2014  . JOINT REPLACEMENT    . TOTAL HIP ARTHROPLASTY Right 11/06/2010  . TRANSPHENOIDAL / TRANSNASAL HYPOPHYSECTOMY / RESECTION PITUITARY TUMOR  06/13/2012  . TUBAL LIGATION  1974     Home Medications:  Prior to Admission medications   Medication Sig Start Date  End Date Taking? Authorizing Provider  acetaminophen (TYLENOL) 500 MG tablet Take 1,000 mg by mouth every 6 (six) hours as needed for headache (pain).   Yes [provider]  albuterol (PROVENTIL HFA;VENTOLIN HFA) 108 (90 Base) MCG/ACT inhaler Inhale 2 puffs into the lungs every 6 (six) hours as needed for wheezing. Patient taking differently: Inhale 2 puffs into the lungs every 6 (six) hours as needed for wheezing or shortness of breath.  02/24/18  Yes Danford, Suann Larry, MD  amLODipine (NORVASC) 10 MG tablet Take 1 tablet (10 mg total) by mouth daily. Patient taking differently: Take 5 mg by mouth daily.  05/07/19  Yes Shelly Coss, MD  atorvastatin (LIPITOR) 80 MG tablet Take 1 tablet (80 mg total) by mouth daily. Patient taking differently: Take 80 mg by mouth once a week.  02/24/18  Yes Danford, Suann Larry, MD  cloNIDine (CATAPRES) 0.3 MG tablet Take 1 tablet (0.3 mg total) by mouth 2 (two) times daily. 05/07/19  Yes Shelly Coss, MD  clopidogrel (PLAVIX) 75 MG tablet Take 1 tablet (75 mg total) by mouth daily. 02/24/18  Yes Danford, Suann Larry, MD  furosemide (LASIX) 40 MG tablet Take 1 tablet (40 mg total) by mouth 2 (two) times daily. Patient taking differently: Take 40 mg by mouth daily.  05/07/19 10/29/19 Yes Shelly Coss, MD  metoprolol tartrate (LOPRESSOR) 25 MG tablet Take 0.5 tablets (12.5 mg total) by mouth 2 (two) times daily. 07/16/19  Yes Marianna Payment, MD  potassium chloride (KLOR-CON) 10 MEQ tablet Take 2 tablets (20 mEq total) by mouth daily. 07/16/19  Yes Marianna Payment, MD  valsartan (DIOVAN) 320 MG tablet Take 320 mg by mouth daily. 08/26/19  Yes [provider]  predniSONE (DELTASONE) 20 MG tablet Take 2 tablets (40 mg total) by mouth daily with breakfast. For 3 days. Patient not taking: Reported on 10/29/2019 07/17/19   Marianna Payment, MD  valsartan (DIOVAN) 160 MG tablet Take 1 tablet (160 mg total) by mouth daily. Patient not taking: Reported on  10/29/2019 05/07/19   Shelly Coss, MD    Inpatient Medications: Scheduled Meds: . fentaNYL (SUBLIMAZE) injection  25 mcg Intravenous Once  . nitroGLYCERIN  1 inch Topical Once  . potassium chloride  40 mEq Oral STAT  . sodium chloride flush  3 mL Intravenous Once   Continuous Infusions:  PRN Meds:   Allergies:    Allergies  Allergen Reactions  . Morphine And Related Hives and Nausea And Vomiting  . Penicillins Hives    Has patient had a PCN reaction causing immediate rash, facial/tongue/throat swelling, SOB or lightheadedness with hypotension: Yes Has patient had a PCN reaction causing severe rash involving mucus membranes or skin necrosis: No Has patient had a PCN reaction that required hospitalization: No Has patient had a PCN reaction  occurring within the last 10 years: No If all of the above answers are "NO", then may proceed with Cephalosporin use.  Marland Kitchen Percocet [Oxycodone-Acetaminophen] Nausea And Vomiting  . Ace Inhibitors Nausea And Vomiting and Cough  . Iodinated Diagnostic Agents Nausea And Vomiting       . Iodine Itching  . Etodolac Nausea And Vomiting  . Tramadol Nausea And Vomiting  . Vicodin [Hydrocodone-Acetaminophen] Nausea And Vomiting    Social History:   Social History   Socioeconomic History  . Marital status: Divorced    Spouse name: Not on file  . Number of children: Not on file  . Years of education: Not on file  . Highest education level: Not on file  Occupational History  . Occupation: unemployed  Tobacco Use  . Smoking status: Current Some Day Smoker    Types: Cigarettes  . Smokeless tobacco: Never Used  Substance and Sexual Activity  . Alcohol use: Yes    Alcohol/week: 0.0 standard drinks    Comment: rarely; "I don't drink.  But I like it."  . Drug use: No  . Sexual activity: Yes  Other Topics Concern  . Not on file  Social History Narrative  . Not on file   Social Determinants of Health   Financial Resource Strain:   .  Difficulty of Paying Living Expenses: Not on file  Food Insecurity:   . Worried About Charity fundraiser in the Last Year: Not on file  . Ran Out of Food in the Last Year: Not on file  Transportation Needs:   . Lack of Transportation (Medical): Not on file  . Lack of Transportation (Non-Medical): Not on file  Physical Activity:   . Days of Exercise per Week: Not on file  . Minutes of Exercise per Session: Not on file  Stress:   . Feeling of Stress : Not on file  Social Connections:   . Frequency of Communication with Friends and Family: Not on file  . Frequency of Social Gatherings with Friends and Family: Not on file  . Attends Religious Services: Not on file  . Active Member of Clubs or Organizations: Not on file  . Attends Archivist Meetings: Not on file  . Marital Status: Not on file  Intimate Partner Violence:   . Fear of Current or Ex-Partner: Not on file  . Emotionally Abused: Not on file  . Physically Abused: Not on file  . Sexually Abused: Not on file    Family History:   Family History  Problem Relation Age of Onset  . Heart failure Mother   . Hypertension Mother   . Diabetes Father   . Hypertension Sister   . Heart failure Sister   . Hypertension Daughter      ROS:  Please see the history of present illness.  All other ROS reviewed and negative.     Physical Exam/Data:   Vitals:   10/29/19 1205 10/29/19 1208  BP:  (!) 160/77  Pulse:  65  Resp:  16  Temp:  98.2 F (36.8 C)  TempSrc:  Oral  SpO2:  96%  Weight: 116.1 kg   Height: 5\' 4"  (1.626 m)    No intake or output data in the 24 hours ending 10/29/19 1451 Last 3 Weights 10/29/2019 07/16/2019 07/15/2019  Weight (lbs) 256 lb 256 lb 6.4 oz 254 lb 11.2 oz  Weight (kg) 116.121 kg 116.302 kg 115.531 kg     Body mass index is 43.94 kg/m.  General:  Well nourished, well developed, in no acute distress HEENT: normal Lymph: no adenopathy Neck: no JVD Endocrine:  No thryomegaly Vascular: No  carotid bruits; FA pulses 2+ bilaterally without bruits  Cardiac:  normal S1, S2; RRR; no murmur  Lungs:  clear to auscultation bilaterally, no wheezing, rhonchi or rales  Abd: soft, nontender, no hepatomegaly  Ext: no edema Musculoskeletal:  No deformities, BUE and BLE strength normal and equal Skin: warm and dry  Neuro:  CNs 2-12 intact, no focal abnormalities noted Psych:  Normal affect   EKG:  The EKG was personally reviewed and demonstrates:  NSR, LVH, TWI inferolateral leads Telemetry:  Telemetry was personally reviewed and demonstrates:  NSR, Hr 60s, rare PVC  Relevant CV Studies:  Echo 07/02/2018 ------------------------------------------------------------------- Study Conclusions  - Left ventricle: The cavity size was normal. Wall thickness was   increased in a pattern of mild LVH. Systolic function was   vigorous. The estimated ejection fraction was in the range of 65%   to 70%. Wall motion was normal; there were no regional wall   motion abnormalities. Features are consistent with a pseudonormal   left ventricular filling pattern, with concomitant abnormal   relaxation and increased filling pressure (grade 2 diastolic   dysfunction). Laboratory Data:  High Sensitivity Troponin:   Recent Labs  Lab 10/29/19 1207  TROPONINIHS 11     Chemistry Recent Labs  Lab 10/29/19 1207  NA 141  K 3.3*  CL 103  CO2 27  GLUCOSE 111*  BUN 14  CREATININE 1.23*  CALCIUM 8.7*  GFRNONAA 44*  GFRAA 51*  ANIONGAP 11    No results for input(s): PROT, ALBUMIN, AST, ALT, ALKPHOS, BILITOT in the last 168 hours. Hematology Recent Labs  Lab 10/29/19 1207  WBC 6.4  RBC 4.25  HGB 12.3  HCT 38.0  MCV 89.4  MCH 28.9  MCHC 32.4  RDW 14.6  PLT 320   BNPNo results for input(s): BNP, PROBNP in the last 168 hours.  DDimer No results for input(s): DDIMER in the last 168 hours.   Radiology/Studies:  DG Chest Portable 1 View  Result Date: 10/29/2019 CLINICAL DATA:  Chest  pain. EXAM: PORTABLE CHEST 1 VIEW COMPARISON:  Chest CT 07/13/2019, chest radiograph 07/13/2019 FINDINGS: Unchanged cardiomegaly. No airspace consolidation. Mild diffuse pulmonary interstitial prominence is unchanged. No evidence of pleural effusion or pneumothorax. No acute bony abnormality. Thoracic spondylosis with multilevel anterolateral osteophytes. Overlying cardiac monitoring leads. IMPRESSION: Unchanged cardiomegaly. Redemonstrated mild diffuse pulmonary interstitial prominence which may reflect interstitial edema. No airspace consolidation. Electronically Signed   By: Kellie Simmering DO   On: 10/29/2019 13:24   { HEAR Score (for undifferentiated chest pain):  HEAR Score: 6    Assessment and Plan:   Chest pain Patient presents with middle to left-sided chest pressure/tightness minimally relieved by nitroglycerin. HS troponin 11 >10.  Pressures elevated. EKG shows inferolateral T wave inversions which are seen on previous EKGs.  -Patient has remote history of normal Myoview.  In 2019 Myoview was canceled due to wheezing on exam -Patient says she still has some chest tightness on exam -Trend troponin - Repeat echo ordered -Risk factors include hypertension, hyperlipidemia, diabetes - Will order Cardiac CTA. Hr in the 93T  Chronic diastolic heart failure -Last echo showed preserved EF with grade 2 diastolic dysfunction -Repeat echo as above -At home patient is on Lasix 40 mg twice daily -Chest x-ray showed possible interstitial edema -Check a BNP -Patient appears to be euvolemic on exam.  Lungs  clear  Hypertension -At baseline patient is on amlodipine 10 mg daily, valsartan 160 mg daily, Lopressor 12.5 mg twice daily -On admission pressure is 160/77.  My exam and is elevated to 192/69. -Continue home medications  Hyperlipidemia -Continue atorvastatin 80 mg daily -LDL 104 on 05/06/2019 -Goal < 100  Diabetes -A1c 6.9 on 05/06/2019 -SSI per IM  For questions or updates, please  contact [facility] Please consult [email or web address] for contact info under     Signed, Coral Timme Ninfa Meeker, PA-C  10/29/2019 2:51 PM

## 2019-10-29 NOTE — ED Triage Notes (Signed)
P[er [facility] pt presents from home w/acute onset of Left side chest pain, non radiating, associated w/SOB. Woke up at 0400 with pain. Hx of CHF, stroke x1, TIA x2 Received 324 mg asa en route   Stopped taking plavix a year ago per MD  Has not taking am meds  208/130 after nitro x2   20g LH

## 2019-10-29 NOTE — H&P (Signed)
History and Physical    Sherry Fitzpatrick:[id number] DOB: 05/09/1951 DOA: 10/29/2019  Referring MD/NP/PA: Dolan Amen, MD PCP: Patient, No Pcp Per  Patient coming from: Home  Chief Complaint: Chest pain  I have personally briefly reviewed patient's old medical records in [place]   HPI: Sherry Fitzpatrick is a 69 y.o. female with medical history significant of hypertension, hyperlipidemia, diastolic CHF EF 01-60%, CVA/TIA, morbid obesity, and sleep apnea.  Patient presents with complaints of substernal chest pain waking up in the middle of the night.  Symptoms initially occurred 2 days ago, but reoccurred waking her up and around 4 in the morning.   Pain had started in the center of her chest and felt like pressure, but radiated to her left arm down into her fingers.  She admits that she does not always take her Lasix as prescribed because it makes her wake up multiple times overnight.  Associated symptoms include reports of weight gain, leg swelling, and shortness of breath with exertion.  Patient had previously been evaluated with nuclear stress test 5 years ago that she reports was negative.  Patient denies that she has any history of smoking, but it does appear on her medical history.    En route with EMS patient had been given 324 mg of aspirin and two sublingual nitroglycerin with only minimal improvement in symptoms.   ED Course: Upon admission into the emergency department patient was noted to be afebrile with blood pressure 160/77 and all other vital signs maintained.  Labs significant for potassium 3.3, BUN 14, creatinine 1.23, and initial troponin negative.  Chest x-ray showed stable cardiomegaly with mild diffuse interstitial prominence concerning for possible edema.  Patient was given 25 mcg of fentanyl and Nitropaste applied.  Cardiology was formally consulted.  TRH called to admit.  ROS  Past Medical History:  Diagnosis Date  . Alterations of sensations, late effect of  cerebrovascular disease(438.6)   . Arthritis    "all over"  . Backache, unspecified   . Body mass index 40.0-44.9, adult ([place])   . Carpal tunnel syndrome   . Dizziness and giddiness   . Esophageal reflux   . Generalized pain   . Headache    "had alot til I had pituitary tumor removed"  . Heart murmur   . Other abnormal glucose   . Other and unspecified hyperlipidemia   . Other malaise and fatigue   . Other postprocedural status(V45.89)   . Other symptoms involving cardiovascular system   . Sleep apnea    "did test; they wanted me to get a CPAP but I never did get it" (04/14/2015)  . Stroke ([place]) 06/2003; 02/2010   "minor; minor" ; denies residual on 04/14/2015  . Unspecified disorder of the pituitary gland and its hypothalamic control   . Unspecified essential hypertension   . Unspecified visual disturbance     Past Surgical History:  Procedure Laterality Date  . CARPAL TUNNEL RELEASE Left ~ 2014  . JOINT REPLACEMENT    . TOTAL HIP ARTHROPLASTY Right 11/06/2010  . TRANSPHENOIDAL / TRANSNASAL HYPOPHYSECTOMY / RESECTION PITUITARY TUMOR  06/13/2012  . TUBAL LIGATION  1974     reports that she has been smoking cigarettes. She has never used smokeless tobacco. She reports current alcohol use. She reports that she does not use drugs.  Allergies  Allergen Reactions  . Morphine And Related Hives and Nausea And Vomiting  . Penicillins Hives    Has patient had a PCN reaction  causing immediate rash, facial/tongue/throat swelling, SOB or lightheadedness with hypotension: Yes Has patient had a PCN reaction causing severe rash involving mucus membranes or skin necrosis: No Has patient had a PCN reaction that required hospitalization: No Has patient had a PCN reaction occurring within the last 10 years: No If all of the above answers are "NO", then may proceed with Cephalosporin use.  Marland Kitchen Percocet [Oxycodone-Acetaminophen] Nausea And Vomiting  . Ace Inhibitors Nausea And Vomiting and Cough    . Iodinated Diagnostic Agents Nausea And Vomiting       . Iodine Itching  . Etodolac Nausea And Vomiting  . Tramadol Nausea And Vomiting  . Vicodin [Hydrocodone-Acetaminophen] Nausea And Vomiting    Family History  Problem Relation Age of Onset  . Heart failure Mother   . Hypertension Mother   . Diabetes Father   . Hypertension Sister   . Heart failure Sister   . Hypertension Daughter     Prior to Admission medications   Medication Sig Start Date End Date Taking? Authorizing Provider  acetaminophen (TYLENOL) 500 MG tablet Take 1,000 mg by mouth every 6 (six) hours as needed for headache (pain).   Yes [provider]  albuterol (PROVENTIL HFA;VENTOLIN HFA) 108 (90 Base) MCG/ACT inhaler Inhale 2 puffs into the lungs every 6 (six) hours as needed for wheezing. Patient taking differently: Inhale 2 puffs into the lungs every 6 (six) hours as needed for wheezing or shortness of breath.  02/24/18  Yes Danford, Suann Larry, MD  amLODipine (NORVASC) 10 MG tablet Take 1 tablet (10 mg total) by mouth daily. Patient taking differently: Take 5 mg by mouth daily.  05/07/19  Yes Shelly Coss, MD  atorvastatin (LIPITOR) 80 MG tablet Take 1 tablet (80 mg total) by mouth daily. Patient taking differently: Take 80 mg by mouth once a week.  02/24/18  Yes Danford, Suann Larry, MD  cloNIDine (CATAPRES) 0.3 MG tablet Take 1 tablet (0.3 mg total) by mouth 2 (two) times daily. 05/07/19  Yes Shelly Coss, MD  clopidogrel (PLAVIX) 75 MG tablet Take 1 tablet (75 mg total) by mouth daily. 02/24/18  Yes Danford, Suann Larry, MD  furosemide (LASIX) 40 MG tablet Take 1 tablet (40 mg total) by mouth 2 (two) times daily. Patient taking differently: Take 40 mg by mouth daily.  05/07/19 10/29/19 Yes Shelly Coss, MD  metoprolol tartrate (LOPRESSOR) 25 MG tablet Take 0.5 tablets (12.5 mg total) by mouth 2 (two) times daily. 07/16/19  Yes Marianna Payment, MD  potassium chloride (KLOR-CON) 10 MEQ tablet  Take 2 tablets (20 mEq total) by mouth daily. 07/16/19  Yes Marianna Payment, MD  valsartan (DIOVAN) 320 MG tablet Take 320 mg by mouth daily. 08/26/19  Yes [provider]  predniSONE (DELTASONE) 20 MG tablet Take 2 tablets (40 mg total) by mouth daily with breakfast. For 3 days. Patient not taking: Reported on 10/29/2019 07/17/19   Marianna Payment, MD  valsartan (DIOVAN) 160 MG tablet Take 1 tablet (160 mg total) by mouth daily. Patient not taking: Reported on 10/29/2019 05/07/19   Shelly Coss, MD    Physical Exam:  Constitutional: NAD, calm, comfortable Vitals:   10/29/19 1205 10/29/19 1208  BP:  (!) 160/77  Pulse:  65  Resp:  16  Temp:  98.2 F (36.8 C)  TempSrc:  Oral  SpO2:  96%  Weight: 116.1 kg   Height: 5\' 4"  (1.626 m)    Eyes: PERRL, lids and conjunctivae normal ENMT: Mucous membranes are moist. Posterior pharynx  clear of any exudate or lesions  Neck: normal, supple, no masses, no thyromegaly.  No JVD appreciated Respiratory: Normal respiratory effort with some mild on crackles heard in the lower lung fields. Cardiovascular: Regular rate and rhythm, no murmurs / rubs / gallops. No significant lower extremity edema. 2+ pedal pulses. No carotid bruits.  Abdomen: no tenderness, no masses palpated. No hepatosplenomegaly. Bowel sounds positive.  Musculoskeletal: no clubbing / cyanosis. No joint deformity upper and lower extremities. Good ROM, no contractures. Normal muscle tone.  Skin: no rashes, lesions, ulcers. No induration Neurologic: CN 2-12 grossly intact. Sensation intact, DTR normal. Strength 5/5 in all 4.  Psychiatric: Normal judgment and insight. Alert and oriented x 3. Normal mood.     Labs on Admission: I have personally reviewed following labs and imaging studies  CBC: Recent Labs  Lab 10/29/19 1207  WBC 6.4  HGB 12.3  HCT 38.0  MCV 89.4  PLT 660   Basic Metabolic Panel: Recent Labs  Lab 10/29/19 1207  NA 141  K 3.3*  CL 103  CO2 27    GLUCOSE 111*  BUN 14  CREATININE 1.23*  CALCIUM 8.7*   GFR: Estimated Creatinine Clearance: 53.3 mL/min (A) (by C-G formula based on SCr of 1.23 mg/dL (H)). Liver Function Tests: No results for input(s): AST, ALT, ALKPHOS, BILITOT, PROT, ALBUMIN in the last 168 hours. No results for input(s): LIPASE, AMYLASE in the last 168 hours. No results for input(s): AMMONIA in the last 168 hours. Coagulation Profile: No results for input(s): INR, PROTIME in the last 168 hours. Cardiac Enzymes: No results for input(s): CKTOTAL, CKMB, CKMBINDEX, TROPONINI in the last 168 hours. BNP (last 3 results) No results for input(s): PROBNP in the last 8760 hours. HbA1C: No results for input(s): HGBA1C in the last 72 hours. CBG: No results for input(s): GLUCAP in the last 168 hours. Lipid Profile: No results for input(s): CHOL, HDL, LDLCALC, TRIG, CHOLHDL, LDLDIRECT in the last 72 hours. Thyroid Function Tests: No results for input(s): TSH, T4TOTAL, FREET4, T3FREE, THYROIDAB in the last 72 hours. Anemia Panel: No results for input(s): VITAMINB12, FOLATE, FERRITIN, TIBC, IRON, RETICCTPCT in the last 72 hours. Urine analysis:    Component Value Date/Time   COLORURINE YELLOW 07/13/2019 1022   APPEARANCEUR HAZY (A) 07/13/2019 1022   LABSPEC 1.015 07/13/2019 1022   PHURINE 5.0 07/13/2019 1022   GLUCOSEU NEGATIVE 07/13/2019 1022   HGBUR NEGATIVE 07/13/2019 1022   BILIRUBINUR NEGATIVE 07/13/2019 1022   KETONESUR NEGATIVE 07/13/2019 1022   PROTEINUR 30 (A) 07/13/2019 1022   UROBILINOGEN 0.2 04/14/2015 1451   NITRITE NEGATIVE 07/13/2019 1022   LEUKOCYTESUR NEGATIVE 07/13/2019 1022   Sepsis Labs: No results found for this or any previous visit (from the past 240 hour(s)).   Radiological Exams on Admission: DG Chest Portable 1 View  Result Date: 10/29/2019 CLINICAL DATA:  Chest pain. EXAM: PORTABLE CHEST 1 VIEW COMPARISON:  Chest CT 07/13/2019, chest radiograph 07/13/2019 FINDINGS: Unchanged  cardiomegaly. No airspace consolidation. Mild diffuse pulmonary interstitial prominence is unchanged. No evidence of pleural effusion or pneumothorax. No acute bony abnormality. Thoracic spondylosis with multilevel anterolateral osteophytes. Overlying cardiac monitoring leads. IMPRESSION: Unchanged cardiomegaly. Redemonstrated mild diffuse pulmonary interstitial prominence which may reflect interstitial edema. No airspace consolidation. Electronically Signed   By: Kellie Simmering DO   On: 10/29/2019 13:24    EKG: Independently reviewed.  66 bpm with T wave abnormality appreciated  Assessment/Plan Chest Pain: Patient presents with left-sided chest pain with improvement with nitroglycerin.  High-sensitivity  troponin negative x2, but EKG did note T wave inversions which appear to previous EKGs.  Heart score noted to be 7. -Admit to cardiac telemetry bed -Continue to trend cardiac troponin -Check echocardiogram -Appreciate cardiology consultative services, will follow for further recommendation -Follow-up coronary CT -N.p.o. after an midnight   Diastolic CHF exacerbation: Acute on chronic.  Patient does not appear grossly fluid overloaded, but does report weight gain.  Last echocardiogram revealed EF of 55 to 60%.  Chest x-ray concerning for possible interstitial edema.  Physical exam can appreciate some lower lung crackles.  She is supposed to be on Lasix 40 mg twice daily, but reports that she intermittently skips doses because it keeps her up all night. Lasix recommended to hold due to procedure -Strict I&Os -Daily weight -Check BNP -Reassess in a.m. and determine if diuresis needed  Hypokalemia: Acute.  On admission potassium mildly low at 3.3.  Patient normally on potassium chloride 20 mEq a daily, but had recently ran out of this medication. -Give 40 mEq of potassium chloride x1 dose now, then resume previous home dose -Adjust replacement dose as needed  Hypertensive urgency: On admission  blood pressure elevated up to 198/79.  Home medication regimen includes metoprolol 12.5 mg twice daily, valsartan 320 mg daily, clonidine 0.3 mg twice daily, furosemide 40 mg daily, and amlodipine 5 mg daily. -Continue metoprolol, pharmacy substitution for valsartan, clonidine, and amlodipine -Hydralazine IV prn elevated blood pressure  Hyperlipidemia: Patient reports taking 80 mg of atorvastatin weekly when she remembers.  Last dose reported to be on 1/11.  Last lipid panel revealed LDL 104 on 05/06/2019. -Check lipid panel -Continue atorvastatin in the outpatient setting  Chronic kidney disease stage III: Creatinine 1.23 which appears near her baseline when reviewing previous records. -Continue to monitor  Morbid obesity: BMI 43.94 kg/m  DVT prophylaxis: Lovenox Code Status: Full Family Communication: No family present at bedside Disposition Plan: Possible discharge home in a.m. depending on work-up Consults called: Cardiology Admission status: Observation  Norval Morton MD [facility] Pager [phone]   If 7PM-7AM, please contact night-coverage [email or web address] Password [facility]  10/29/2019, 2:42 PM

## 2019-10-29 NOTE — ED Notes (Signed)
Pt is keeping family updated herself, declines needing anyone to be contacted

## 2019-10-29 NOTE — ED Notes (Signed)
Report given to Tasha RN.

## 2019-10-29 NOTE — ED Provider Notes (Signed)
[facility] EMERG[facility]Y DEPARTMENT Provider Note   CSN: [id number] Arrival date & time: 10/29/19  1157     History Chief Complaint  Patient presents with  . Chest Pain    Sherry Fitzpatrick is a 68 y.o. female.  HPI  HPI: A 69 year old patient with a history of CVA, hypertension, hypercholesterolemia and obesity presents for evaluation of chest pain. Initial onset of pain was more than 6 hours ago. The patient's chest pain is described as heaviness/pressure/tightness, is not worse with exertion and is relieved by nitroglycerin. The patient's chest pain is middle- or left-sided, is not well-localized, is not sharp and does radiate to the arms/jaw/neck. The patient does not complain of nausea and denies diaphoresis. The patient has no history of peripheral artery disease, has not smoked in the past 90 days, denies any history of treated diabetes and has no relevant family history of coronary artery disease (first degree relative at less than age 8).   Past Medical History:  Diagnosis Date  . Alterations of sensations, late effect of cerebrovascular disease(438.6)   . Arthritis    "all over"  . Backache, unspecified   . Body mass index 40.0-44.9, adult ([place])   . Carpal tunnel syndrome   . Dizziness and giddiness   . Esophageal reflux   . Generalized pain   . Headache    "had alot til I had pituitary tumor removed"  . Heart murmur   . Other abnormal glucose   . Other and unspecified hyperlipidemia   . Other malaise and fatigue   . Other postprocedural status(V45.89)   . Other symptoms involving cardiovascular system   . Sleep apnea    "did test; they wanted me to get a CPAP but I never did get it" (04/14/2015)  . Stroke ([place]) 06/2003; 02/2010   "minor; minor" ; denies residual on 04/14/2015  . Unspecified disorder of the pituitary gland and its hypothalamic control   . Unspecified essential hypertension   . Unspecified visual disturbance     Patient Active  Problem List   Diagnosis Date Noted  . Reactive airway disease without complication   . Dyspnea 07/13/2019  . Acute ischemic stroke ([place]) 05/05/2019  . AKI (acute kidney injury) ([place]) 07/03/2018  . Hypertensive crisis 07/01/2018  . Tobacco abuse 07/01/2018  . Hypokalemia 07/01/2018  . Chronic diastolic CHF (congestive heart failure) ([place]) 07/01/2018  . Positive D dimer   . Hypertensive urgency 02/22/2018  . Malignant hypertension 02/20/2018  . Shortness of breath 02/20/2018  . Stroke ([place])   . Chest pain 04/14/2015  . Right sided weakness 04/14/2015  . Hyperlipidemia   . Body mass index 40.0-44.9, adult ([place])   . Essential hypertension   . Blood glucose elevated   . Asthma   . OSA (obstructive sleep apnea) 05/06/2013    Past Surgical History:  Procedure Laterality Date  . CARPAL TUNNEL RELEASE Left ~ 2014  . JOINT REPLACEMENT    . TOTAL HIP ARTHROPLASTY Right 11/06/2010  . TRANSPHENOIDAL / TRANSNASAL HYPOPHYSECTOMY / RESECTION PITUITARY TUMOR  06/13/2012  . TUBAL LIGATION  1974     OB History   No obstetric history on file.     Family History  Problem Relation Age of Onset  . Heart failure Mother   . Hypertension Mother   . Diabetes Father   . Hypertension Sister   . Heart failure Sister   . Hypertension Daughter     Social History   Tobacco Use  . Smoking  status: Current Some Day Smoker    Types: Cigarettes  . Smokeless tobacco: Never Used  Substance Use Topics  . Alcohol use: Yes    Alcohol/week: 0.0 standard drinks    Comment: rarely; "I don't drink.  But I like it."  . Drug use: No    Home Medications Prior to Admission medications   Medication Sig Start Date End Date Taking? Authorizing Provider  acetaminophen (TYLENOL) 500 MG tablet Take 1,000 mg by mouth every 6 (six) hours as needed for headache (pain).   Yes [provider]  albuterol (PROVENTIL HFA;VENTOLIN HFA) 108 (90 Base) MCG/ACT inhaler Inhale 2 puffs into the lungs every 6 (six)  hours as needed for wheezing. Patient taking differently: Inhale 2 puffs into the lungs every 6 (six) hours as needed for wheezing or shortness of breath.  02/24/18  Yes Danford, Suann Larry, MD  amLODipine (NORVASC) 10 MG tablet Take 1 tablet (10 mg total) by mouth daily. Patient taking differently: Take 5 mg by mouth daily.  05/07/19  Yes Shelly Coss, MD  atorvastatin (LIPITOR) 80 MG tablet Take 1 tablet (80 mg total) by mouth daily. Patient taking differently: Take 80 mg by mouth once a week.  02/24/18  Yes Danford, Suann Larry, MD  cloNIDine (CATAPRES) 0.3 MG tablet Take 1 tablet (0.3 mg total) by mouth 2 (two) times daily. 05/07/19  Yes Shelly Coss, MD  clopidogrel (PLAVIX) 75 MG tablet Take 1 tablet (75 mg total) by mouth daily. 02/24/18  Yes Danford, Suann Larry, MD  furosemide (LASIX) 40 MG tablet Take 1 tablet (40 mg total) by mouth 2 (two) times daily. Patient taking differently: Take 40 mg by mouth daily.  05/07/19 10/29/19 Yes Shelly Coss, MD  metoprolol tartrate (LOPRESSOR) 25 MG tablet Take 0.5 tablets (12.5 mg total) by mouth 2 (two) times daily. 07/16/19  Yes Marianna Payment, MD  potassium chloride (KLOR-CON) 10 MEQ tablet Take 2 tablets (20 mEq total) by mouth daily. 07/16/19  Yes Marianna Payment, MD  valsartan (DIOVAN) 320 MG tablet Take 320 mg by mouth daily. 08/26/19  Yes [provider]  predniSONE (DELTASONE) 20 MG tablet Take 2 tablets (40 mg total) by mouth daily with breakfast. For 3 days. Patient not taking: Reported on 10/29/2019 07/17/19   Marianna Payment, MD  valsartan (DIOVAN) 160 MG tablet Take 1 tablet (160 mg total) by mouth daily. Patient not taking: Reported on 10/29/2019 05/07/19   Shelly Coss, MD    Allergies    Morphine and related, Penicillins, Percocet [oxycodone-acetaminophen], Ace inhibitors, Iodinated diagnostic agents, Iodine, Etodolac, Tramadol, and Vicodin [hydrocodone-acetaminophen]  Review of Systems   Review of Systems    Constitutional: Positive for activity change.  Respiratory: Positive for chest tightness. Negative for shortness of breath.   Cardiovascular: Positive for chest pain.  Gastrointestinal: Negative for nausea and vomiting.  All other systems reviewed and are negative.   Physical Exam Updated Vital Signs BP (!) 160/77 (BP Location: Right Arm)   Pulse 65   Temp 98.2 F (36.8 C) (Oral)   Resp 16   Ht 5\' 4"  (1.626 m)   Wt 116.1 kg   SpO2 96%   BMI 43.94 kg/m   Physical Exam Vitals and nursing note reviewed.  Constitutional:      Appearance: She is well-developed.  HENT:     Head: Normocephalic and atraumatic.  Cardiovascular:     Rate and Rhythm: Normal rate.  Pulmonary:     Effort: Pulmonary effort is normal.  Abdominal:  General: Bowel sounds are normal.  Musculoskeletal:     Cervical back: Normal range of motion and neck supple.  Skin:    General: Skin is warm and dry.  Neurological:     Mental Status: She is alert and oriented to person, place, and time.     ED Results / Procedures / Treatments   Labs (all labs ordered are listed, but only abnormal results are displayed) Labs Reviewed  BASIC METABOLIC PANEL - Abnormal; Notable for the following components:      Result Value   Potassium 3.3 (*)    Glucose, Bld 111 (*)    Creatinine, Ser 1.23 (*)    Calcium 8.7 (*)    GFR calc non Af Amer 44 (*)    GFR calc Af Amer 51 (*)    All other components within normal limits  CBC  TROPONIN I (HIGH SENSITIVITY)  TROPONIN I (HIGH SENSITIVITY)    EKG EKG Interpretation  Date/Time:  Thursday October 29 2019 12:05:03 EST Ventricular Rate:  66 PR Interval:    QRS Duration: 98 QT Interval:  428 QTC Calculation: 449 R Axis:   48 Text Interpretation: Sinus rhythm Short PR interval Probable left ventricular hypertrophy Abnormal T, consider ischemia, diffuse leads No acute changes Nonspecific T wave abnormality No significant change since last tracing Confirmed by  Varney Biles ([id number]) on 10/29/2019 12:15:18 PM   Radiology DG Chest Portable 1 View  Result Date: 10/29/2019 CLINICAL DATA:  Chest pain. EXAM: PORTABLE CHEST 1 VIEW COMPARISON:  Chest CT 07/13/2019, chest radiograph 07/13/2019 FINDINGS: Unchanged cardiomegaly. No airspace consolidation. Mild diffuse pulmonary interstitial prominence is unchanged. No evidence of pleural effusion or pneumothorax. No acute bony abnormality. Thoracic spondylosis with multilevel anterolateral osteophytes. Overlying cardiac monitoring leads. IMPRESSION: Unchanged cardiomegaly. Redemonstrated mild diffuse pulmonary interstitial prominence which may reflect interstitial edema. No airspace consolidation. Electronically Signed   By: Kellie Simmering DO   On: 10/29/2019 13:24    Procedures Procedures (including critical care time)  Medications Ordered in ED Medications  sodium chloride flush (NS) 0.9 % injection 3 mL (has no administration in time range)  nitroGLYCERIN (NITROGLYN) 2 % ointment 1 inch (has no administration in time range)  fentaNYL (SUBLIMAZE) injection 25 mcg (has no administration in time range)    ED Course  I have reviewed the triage vital signs and the nursing notes.  Pertinent labs & imaging results that were available during my care of the patient were reviewed by me and considered in my medical decision making (see chart for details).    MDM Rules/Calculators/A&P HEAR Score: 43                    69 year old female comes in a chief complaint of chest pain.  Chest pain is pressure type and midsternal with discomfort and pain in her left shoulder yesterday.  She has multiple cardiac risk factors and here score is 7.  No known history of ACS/CAD.  EKG showing T wave inversions which are not new and nonspecific ST depressions. She will need admission for ACS rule out.  2:28 PM Initial high-sensitivity troponin is negative.  We will consult medicine for admission. She had received full dose  of aspirin per EMS.  She is still having some chest discomfort, Nitropaste to be ordered along with fentanyl.  Final Clinical Impression(s) / ED Diagnoses Final diagnoses:  Angina pectoris ([place])    Rx / DC Orders ED Discharge Orders    None  Varney Biles, MD 10/29/19 1428

## 2019-10-30 ENCOUNTER — Observation Stay (HOSPITAL_COMMUNITY): Payer: Medicare HMO

## 2019-10-30 ENCOUNTER — Encounter (HOSPITAL_COMMUNITY): Payer: Self-pay | Admitting: Internal Medicine

## 2019-10-30 DIAGNOSIS — I5032 Chronic diastolic (congestive) heart failure: Secondary | ICD-10-CM

## 2019-10-30 DIAGNOSIS — R079 Chest pain, unspecified: Secondary | ICD-10-CM | POA: Diagnosis not present

## 2019-10-30 DIAGNOSIS — R739 Hyperglycemia, unspecified: Secondary | ICD-10-CM | POA: Diagnosis not present

## 2019-10-30 DIAGNOSIS — I160 Hypertensive urgency: Secondary | ICD-10-CM | POA: Diagnosis not present

## 2019-10-30 DIAGNOSIS — E7800 Pure hypercholesterolemia, unspecified: Secondary | ICD-10-CM

## 2019-10-30 DIAGNOSIS — I208 Other forms of angina pectoris: Secondary | ICD-10-CM | POA: Diagnosis not present

## 2019-10-30 DIAGNOSIS — I110 Hypertensive heart disease with heart failure: Secondary | ICD-10-CM

## 2019-10-30 LAB — BASIC METABOLIC PANEL
Anion gap: 10 (ref 5–15)
BUN: 22 mg/dL (ref 8–23)
CO2: 27 mmol/L (ref 22–32)
Calcium: 8.7 mg/dL — ABNORMAL LOW (ref 8.9–10.3)
Chloride: 100 mmol/L (ref 98–111)
Creatinine, Ser: 1.33 mg/dL — ABNORMAL HIGH (ref 0.44–1.00)
GFR calc Af Amer: 47 mL/min — ABNORMAL LOW (ref >60)
GFR calc non Af Amer: 40 mL/min — ABNORMAL LOW (ref >60)
Glucose, Bld: 174 mg/dL — ABNORMAL HIGH (ref 70–99)
Potassium: 4.1 mmol/L (ref 3.5–5.1)
Sodium: 137 mmol/L (ref 135–145)

## 2019-10-30 LAB — LIPID PANEL
Cholesterol: 256 mg/dL — ABNORMAL HIGH (ref 0–200)
HDL: 34 mg/dL — ABNORMAL LOW (ref >40)
LDL Cholesterol: 201 mg/dL — ABNORMAL HIGH (ref 0–99)
Total CHOL/HDL Ratio: 7.5 RATIO
Triglycerides: 105 mg/dL (ref <150)
VLDL: 21 mg/dL (ref 0–40)

## 2019-10-30 MED ORDER — IOHEXOL 350 MG/ML SOLN
80.0000 mL | Freq: Once | INTRAVENOUS | Status: AC | PRN
  Administered 2019-10-30: 80 mL via INTRAVENOUS

## 2019-10-30 MED ORDER — NITROGLYCERIN 0.4 MG SL SUBL
SUBLINGUAL_TABLET | SUBLINGUAL | Status: AC
  Filled 2019-10-30: qty 2

## 2019-10-30 MED ORDER — NITROGLYCERIN 0.4 MG SL SUBL
0.8000 mg | SUBLINGUAL_TABLET | SUBLINGUAL | Status: DC | PRN
  Administered 2019-10-30: 11:00:00 0.8 mg via SUBLINGUAL

## 2019-10-30 MED ORDER — ATORVASTATIN CALCIUM 80 MG PO TABS
80.0000 mg | ORAL_TABLET | Freq: Every day | ORAL | Status: DC
  Administered 2019-10-30: 17:00:00 80 mg via ORAL
  Filled 2019-10-30: qty 1

## 2019-10-30 MED ORDER — AMLODIPINE BESYLATE 10 MG PO TABS
10.0000 mg | ORAL_TABLET | Freq: Every day | ORAL | Status: DC
  Administered 2019-10-31: 09:00:00 10 mg via ORAL
  Filled 2019-10-30: qty 1

## 2019-10-30 NOTE — Progress Notes (Signed)
[facility] PROGRESS NOTE  BEYA TIPPS JXB:[id number] DOB: November 07, 1950 DOA: 10/29/2019 PCP: Patient, No Pcp Per  Assessment/Plan:  Chest Pain: Pain-free this AM.  Patient presented with left-sided chest pain with improvement with nitroglycerin.  High-sensitivity troponin negative x2, but EKG did note T wave inversions which appear to previous EKGs.  Heart score noted to be 7.  No events on telemetry.  She also reports a fall several days ago.  No events on telemetry.  Evaluated by cardiology who recommend  coronary CTA. -Follow-up coronary CT  Diastolic CHF exacerbation: Acute on chronic.  Patient did not appear grossly fluid overloaded, but reported weight gain.  Last echocardiogram revealed EF of 55 to 60%.  Chest x-ray concerning for possible interstitial edema.  Physical exam can appreciate some lower lung crackles.  BNP 45. She is supposed to be on Lasix 40 mg twice daily, but reported that she intermittently skips doses because it keeps her up all night. Lasix recommended to hold due to procedure -Strict I&Os -Daily weight -Plan to resume Lasix when indicated  Hypokalemia: Acute.  On admission potassium mildly low at 3.3.  Patient normally on potassium chloride 20 mEq a daily, but had recently ran out of this medication.  She was provided with 40 mEq of potassium chloride. -A.m. labs pending -Resume home supplement as indicated -Adjust replacement dose as needed  Hypertensive urgency: Blood pressure remains elevated.  On admission blood pressure elevated up to 198/79.  Home medication regimen includes metoprolol 12.5 mg twice daily, valsartan 320 mg daily, clonidine 0.3 mg twice daily, furosemide 40 mg daily, and amlodipine 5 mg daily. -Continue metoprolol, pharmacy substitution for valsartan, clonidine, and amlodipine -Hydralazine IV prn elevated blood pressure  Hyperlipidemia: Patient reported taking 80 mg of atorvastatin weekly when she remembers.  Last dose reported to be  on 1/11.  Lipid panel with cholesterol at 256, HDL 34, LDL 201, triglycerides 105. -Continue atorvastatin in the outpatient setting  Chronic kidney disease stage III: Creatinine 1.23 which appears near her baseline when reviewing previous records. -Continue to monitor  Morbid obesity: BMI 43.94 kg/m   Code Status: full Family Communication: patient at bedside Disposition Plan: home when cleared by cardiology   Consultants:  Cardiology [place]  Procedures:    Antibiotics:    HPI/Subjective: Sitting on the side of the bed taking her morning medications.  Reports chest pain resolved.  Reports a fall several days ago and has some achiness in her low back and right hip.  Declines a physical therapy evaluation  Objective: Vitals:   10/30/19 0938 10/30/19 1200  BP: (!) 151/78 (!) 158/93  Pulse: (!) 58 63  Resp:  18  Temp:  (!) 97.4 F (36.3 C)  SpO2: 97% 98%    Intake/Output Summary (Last 24 hours) at 10/30/2019 1328 Last data filed at 10/30/2019 0800 Gross per 24 hour  Intake 700 ml  Output 100 ml  Net 600 ml   Filed Weights   10/29/19 1205 10/30/19 0300  Weight: 116.1 kg 114.4 kg    Exam:   General: Obese alert no acute distress  Cardiovascular: Regular rate and rhythm no murmur gallop or rub  Respiratory: No effort breath sounds are clear bilaterally I hear no crackles no wheezes no rhonchi  Abdomen: Obese soft positive bowel sounds throughout no guarding or rebounding  Musculoskeletal: Joints without swelling/erythema full range of motion  Data Reviewed: Basic Metabolic Panel: Recent Labs  Lab 10/29/19 1207  NA 141  K 3.3*  CL 103  CO2 27  GLUCOSE 111*  BUN 14  CREATININE 1.23*  CALCIUM 8.7*   Liver Function Tests: No results for input(s): AST, ALT, ALKPHOS, BILITOT, PROT, ALBUMIN in the last 168 hours. No results for input(s): LIPASE, AMYLASE in the last 168 hours. No results for input(s): AMMONIA in the last 168 hours. CBC: Recent  Labs  Lab 10/29/19 1207  WBC 6.4  HGB 12.3  HCT 38.0  MCV 89.4  PLT 320   Cardiac Enzymes: No results for input(s): CKTOTAL, CKMB, CKMBINDEX, TROPONINI in the last 168 hours. BNP (last 3 results) Recent Labs    07/13/19 0921 10/29/19 1207  BNP 96.7 45.3    ProBNP (last 3 results) No results for input(s): PROBNP in the last 8760 hours.  CBG: No results for input(s): GLUCAP in the last 168 hours.  Recent Results (from the past 240 hour(s))  SARS CORONAVIRUS 2 (TAT 6-24 HRS) Nasopharyngeal Nasopharyngeal Swab     Status: None   Collection Time: 10/29/19  3:10 PM   Specimen: Nasopharyngeal Swab  Result Value Ref Range Status   SARS Coronavirus 2 NEGATIVE NEGATIVE Final    Comment: (NOTE) SARS-CoV-2 target nucleic acids are NOT DETECTED. The SARS-CoV-2 RNA is generally detectable in upper and lower respiratory specimens during the acute phase of infection. Negative results do not preclude SARS-CoV-2 infection, do not rule out co-infections with other pathogens, and should not be used as the sole basis for treatment or other patient management decisions. Negative results must be combined with clinical observations, patient history, and epidemiological information. The expected result is Negative. Fact Sheet for Patients: SugarRoll.be Fact Sheet for Healthcare Providers: https://www.woods-mathews.com/ This test is not yet approved or cleared by the [place] FDA and  has been authorized for detection and/or diagnosis of SARS-CoV-2 by FDA under an Emergency Use Authorization (EUA). This EUA will remain  in effect (meaning this test can be used) for the duration of the COVID-19 declaration under Section 56 4(b)(1) of the Act, 21 U.S.C. section 360bbb-3(b)(1), unless the authorization is terminated or revoked sooner. Performed at [facility], [place] [address] [place], [place] [address]      Studies: CT CORONARY MORPH  W/CTA COR W/SCORE W/CA W/CM &/OR WO/CM  Result Date: 10/30/2019 EXAM: OVER-READ INTERPRETATION  CT CHEST The following report is an over-read performed by radiologist Dr. Abigail Miyamoto of [facility], [place] on 10/30/2019. This over-read does not include interpretation of cardiac or coronary anatomy or pathology. The coronary CTA interpretation by the cardiologist is attached. COMPARISON:  Chest radiographs most recent 10/29/2019. Diagnostic chest CT 07/13/2019. FINDINGS: Vascular: Aortic atherosclerosis.  Tortuous thoracic aorta. No central pulmonary embolism, on this non-dedicated study. Mediastinum/Nodes: Prominent mediastinal nodes are similar to 07/13/2019. Example low right paratracheal node of 1.3 cm on 05/12. No hilar adenopathy. Lungs/Pleura: No pleural fluid. 5 mm left upper lobe pulmonary nodule on 15/13, similar on the most recent exam and in an area of airspace disease on the 07/02/2018 CT. Upper Abdomen: Hepatic steatosis. Normal imaged portions of the spleen, stomach. Musculoskeletal: Thoracic spondylosis. IMPRESSION: 1.  No acute findings in the imaged extracardiac chest. 2.  Aortic Atherosclerosis (ICD10-I70.0). 3. Left upper lobe 5 mm pulmonary nodule. No follow-up needed if patient is low-risk. Non-contrast chest CT can be considered in 12 months if patient is high-risk. This recommendation follows the consensus statement: Guidelines for Management of Incidental Pulmonary Nodules Detected on CT Images: From the Fleischner Society 2017; Radiology 2017; 284:228-243. 4. Hepatic steatosis. 5. Similar prominent  mediastinal nodes, incompletely imaged. Most likely reactive. Electronically Signed   By: Abigail Miyamoto M.D.   On: 10/30/2019 12:26   DG Chest Portable 1 View  Result Date: 10/29/2019 CLINICAL DATA:  Chest pain. EXAM: PORTABLE CHEST 1 VIEW COMPARISON:  Chest CT 07/13/2019, chest radiograph 07/13/2019 FINDINGS: Unchanged cardiomegaly. No airspace consolidation. Mild diffuse pulmonary  interstitial prominence is unchanged. No evidence of pleural effusion or pneumothorax. No acute bony abnormality. Thoracic spondylosis with multilevel anterolateral osteophytes. Overlying cardiac monitoring leads. IMPRESSION: Unchanged cardiomegaly. Redemonstrated mild diffuse pulmonary interstitial prominence which may reflect interstitial edema. No airspace consolidation. Electronically Signed   By: Kellie Simmering DO   On: 10/29/2019 13:24    Scheduled Meds: . amLODipine  5 mg Oral Daily  . atorvastatin  80 mg Oral q1800  . cloNIDine  0.3 mg Oral BID  . clopidogrel  75 mg Oral Daily  . enoxaparin (LOVENOX) injection  40 mg Subcutaneous Q24H  . irbesartan  300 mg Oral Daily  . metoprolol tartrate  12.5 mg Oral BID  . nitroGLYCERIN      . potassium chloride  20 mEq Oral Daily   Continuous Infusions:  Principal Problem:   Chest pain Active Problems:   Hyperlipidemia   Hypertensive urgency   Hypokalemia   Chronic diastolic CHF (congestive heart failure) (HCC)   Morbid obesity (HCC)   CKD (chronic kidney disease), stage III    Time spent: 55 minutes    [place] NP [facility]  If 7PM-7AM, please contact night-coverage at [email or web address], password [facility] 10/30/2019, 1:28 PM  LOS: 0 days

## 2019-10-30 NOTE — Care Management Obs Status (Signed)
[place] NOTIFICATION   Patient Details  Name: Sherry Fitzpatrick MRN: [id number] Date of Birth: 1951/08/06   Medicare Observation Status Notification Given:  Yes    Zenon Mayo, RN 10/30/2019, 6:01 PM

## 2019-10-30 NOTE — Progress Notes (Signed)
Coronary CTA reviewed. Does not appear to have significant obstructive disease. Some moderate plaque in the proximal and mid LAD. FFR pending.   Based on these results recommend aggressive risk factor modification with optimal BP control and lipid lowering therapy. Goal LDL <70.   Winnona Wargo Martinique MD, [facility]

## 2019-10-30 NOTE — Progress Notes (Signed)
Progress Note  Patient Name: Sherry Fitzpatrick Date of Encounter: 10/30/2019  Primary Cardiologist: Dr. Martinique   Subjective   Chest discomfort improved. Having leg cramp.   Inpatient Medications    Scheduled Meds: . amLODipine  5 mg Oral Daily  . cloNIDine  0.3 mg Oral BID  . clopidogrel  75 mg Oral Daily  . diphenhydrAMINE  50 mg Oral Once   Or  . diphenhydrAMINE  50 mg Intravenous Once  . enoxaparin (LOVENOX) injection  40 mg Subcutaneous Q24H  . irbesartan  300 mg Oral Daily  . metoprolol tartrate  12.5 mg Oral BID  . potassium chloride  20 mEq Oral Daily  . predniSONE  50 mg Oral Q6H   Continuous Infusions:  PRN Meds: acetaminophen, alum & mag hydroxide-simeth, hydrALAZINE, ondansetron (ZOFRAN) IV   Vital Signs    Vitals:   10/29/19 1950 10/30/19 0025 10/30/19 0300 10/30/19 0351  BP: 133/76 (!) 155/77  (!) 186/74  Pulse: (!) 54 60  66  Resp: 19 19  17   Temp: 98.1 F (36.7 C) 99 F (37.2 C)  98.5 F (36.9 C)  TempSrc: Oral Oral  Oral  SpO2: 95% 98%  100%  Weight:   114.4 kg   Height:        Intake/Output Summary (Last 24 hours) at 10/30/2019 0803 Last data filed at 10/30/2019 0208 Gross per 24 hour  Intake 580 ml  Output 100 ml  Net 480 ml   Last 3 Weights 10/30/2019 10/29/2019 07/16/2019  Weight (lbs) 252 lb 3.2 oz 256 lb 256 lb 6.4 oz  Weight (kg) 114.397 kg 116.121 kg 116.302 kg      Telemetry    SR at 60s - Personally Reviewed  ECG    No new tracing   Physical Exam   GEN: No acute distress.   Neck: No JVD Cardiac: RRR, no murmurs, rubs, or gallops.  Respiratory: Clear to auscultation bilaterally. GI: Soft, nontender, non-distended  MS: No edema; No deformity. Neuro:  Nonfocal  Psych: Normal affect   Labs    High Sensitivity Troponin:   Recent Labs  Lab 10/29/19 1207 10/29/19 1407  TROPONINIHS 11 10      Chemistry Recent Labs  Lab 10/29/19 1207  NA 141  K 3.3*  CL 103  CO2 27  GLUCOSE 111*  BUN 14  CREATININE 1.23*    CALCIUM 8.7*  GFRNONAA 44*  GFRAA 51*  ANIONGAP 11     Hematology Recent Labs  Lab 10/29/19 1207  WBC 6.4  RBC 4.25  HGB 12.3  HCT 38.0  MCV 89.4  MCH 28.9  MCHC 32.4  RDW 14.6  PLT 320    BNP Recent Labs  Lab 10/29/19 1207  BNP 45.3     Radiology    DG Chest Portable 1 View  Result Date: 10/29/2019 CLINICAL DATA:  Chest pain. EXAM: PORTABLE CHEST 1 VIEW COMPARISON:  Chest CT 07/13/2019, chest radiograph 07/13/2019 FINDINGS: Unchanged cardiomegaly. No airspace consolidation. Mild diffuse pulmonary interstitial prominence is unchanged. No evidence of pleural effusion or pneumothorax. No acute bony abnormality. Thoracic spondylosis with multilevel anterolateral osteophytes. Overlying cardiac monitoring leads. IMPRESSION: Unchanged cardiomegaly. Redemonstrated mild diffuse pulmonary interstitial prominence which may reflect interstitial edema. No airspace consolidation. Electronically Signed   By: Kellie Simmering DO   On: 10/29/2019 13:24    Cardiac Studies   Pending coronary CT  Patient Profile     69 y.o. female  with a hx of CVA 2004 and 2011,  hypertension, hypercholesterolemia, chronic diastolic heart failure and obesity presents for CP evaluation.   Assessment & Plan    1. Atypical chest pain - Troponin negative. EKG without acute findings. This could be due to elevated BP. Pending coronary CT.   2. Hypertensive Urgency - Elevated this morning. Increase amlodipine to home dose at 10mg  qd. Continue Clonidine, irbesartan and metoprolol.   3. Hypokalemia - On daily supplement.  4. HLD - 10/30/2019: Cholesterol 256; HDL 34; LDL Cholesterol 201; Triglycerides 105; VLDL 21  - Consider resumption of home statin - Make sure patient is compliant  For questions or updates, please contact [place] Please consult [email or web address] for contact info under        SignedLeanor Kail, PA  10/30/2019, 8:03 AM

## 2019-10-30 NOTE — Plan of Care (Signed)

## 2019-10-31 DIAGNOSIS — R739 Hyperglycemia, unspecified: Secondary | ICD-10-CM | POA: Diagnosis not present

## 2019-10-31 DIAGNOSIS — R079 Chest pain, unspecified: Secondary | ICD-10-CM | POA: Diagnosis not present

## 2019-10-31 DIAGNOSIS — I208 Other forms of angina pectoris: Secondary | ICD-10-CM | POA: Diagnosis not present

## 2019-10-31 NOTE — Discharge Summary (Signed)
Physician Discharge Summary  Sherry Fitzpatrick JOA:[id number] DOB: November 11, 1950 DOA: 10/29/2019  PCP: Patient, No Pcp Per  Admit date: 10/29/2019 Discharge date: 10/31/2019  Admitted From: Home Discharge disposition: Home   Recommendations for Outpatient Follow-Up:   1. Needs hemoglobin A1c and bmp checked at next PCP visit 2. Have instructed patient to follow diabetic diet 3. Needs to be compliant with both cholesterol and blood pressure medication 4. Encourage exercise 5. Outpatient cardiology follow-up   Discharge Diagnosis:   Principal Problem:   Chest pain Active Problems:   Hyperlipidemia   Hypertensive urgency   Hypokalemia   Chronic diastolic CHF (congestive heart failure) (HCC)   Morbid obesity (HCC)   CKD (chronic kidney disease), stage III    Discharge Condition: Improved.  Diet recommendation: Low sodium, heart healthy.  Carbohydrate-modified  Wound care: None.  Code status: Full.   History of Present Illness:   Sherry Fitzpatrick is a 69 y.o. female with medical history significant of hypertension, hyperlipidemia, diastolic CHF EF 60-10%, CVA/TIA, morbid obesity, and sleep apnea.  Patient presents with complaints of substernal chest pain waking up in the middle of the night.  Symptoms initially occurred 2 days ago, but reoccurred waking her up and around 4 in the morning.   Pain had started in the center of her chest and felt like pressure, but radiated to her left arm down into her fingers.  She admits that she does not always take her Lasix as prescribed because it makes her wake up multiple times overnight.  Associated symptoms include reports of weight gain, leg swelling, and shortness of breath with exertion.  Patient had previously been evaluated with nuclear stress test 5 years ago that she reports was negative.  Patient denies that she has any history of smoking, but it does appear on her medical history.    En route with EMS patient had been given  324 mg of aspirin and two sublingual nitroglycerin with only minimal improvement in symptoms   Hospital Course by Problem:   1. Atypical chest pain -CT coronary done: Does not appear to have significant obstructive disease, she needs aggressive risk factor modification with blood pressure control and lipid-lowering therapies as well as tight control of her hemoglobin A1c  2. Hypertensive Urgency - Increased amlodipine to home dose at 10mg  qd. Continue Clonidine, irbesartan and metoprolol.   3. Hypokalemia -Replete  4. HLD - 10/30/2019: Cholesterol 256; HDL 34; LDL Cholesterol 201; Triglycerides 105; VLDL 21  - Consider resumption of home statin - Make sure patient is compliant  5.  Elevated hemoglobin A1c in July 2020 -Does not appear patient was started on any medications or was aware -Have instructed patient to follow a diabetic diet, I think if she cuts out her regular sodas this will improve    Medical Consultants:      Discharge Exam:   Vitals:   10/31/19 0554 10/31/19 0925  BP: (!) 156/65 (!) 166/78  Pulse: 63 61  Resp: 16   Temp: 99.1 F (37.3 C)   SpO2: 100% 98%   Vitals:   10/30/19 1200 10/30/19 2037 10/31/19 0554 10/31/19 0925  BP: (!) 158/93 (!) 145/78 (!) 156/65 (!) 166/78  Pulse: 63 73 63 61  Resp: 18 16 16    Temp: (!) 97.4 F (36.3 C) 98.3 F (36.8 C) 99.1 F (37.3 C)   TempSrc: Oral Oral Oral   SpO2: 98% 100% 100% 98%  Weight:      Height:  General exam: Appears calm and comfortable.    The results of significant diagnostics from this hospitalization (including imaging, microbiology, ancillary and laboratory) are listed below for reference.     Procedures and Diagnostic Studies:   CT CORONARY MORPH W/CTA COR W/SCORE W/CA W/CM &/OR WO/CM  Addendum Date: 10/30/2019   ADDENDUM REPORT: 10/30/2019 14:01 EXAM: Cardiac/Coronary  CT TECHNIQUE: The patient was scanned on a [employer]. FINDINGS: A 120 kV prospective scan was  triggered in the descending thoracic aorta at 111 HU's. Axial non-contrast 3 mm slices were carried out through the heart. The data set was analyzed on a dedicated work station and scored using the Burt. Gantry rotation speed was 250 msecs and collimation was .6 mm. No beta blockade and 0.8 mg of sl NTG was given. The 3D data set was reconstructed in 5% intervals of the 67-82 % of the R-R cycle. Diastolic phases were analyzed on a dedicated work station using MPR, MIP and VRT modes. The patient received 80 cc of contrast. Aorta:  Normal size.  No calcifications.  No dissection. Aortic Valve:  Trileaflet.  No calcifications. Coronary Arteries:  Normal coronary origin.  Right dominance. RCA is a co dominant artery that gives rise to PDA. There is significant motion artifact in mid and distal RCA. No obvious plaque but study quality reduced. Left main is a large artery that gives rise to LAD and LCX arteries. There is no plaque. LAD is a large vessel that has a as least mild and possibly moderate calcified plaque in the proximal and mid LAD with associated stenosis of 50-69%. The study is very limited by noise and motion artifact. LCX is a co-dominant artery that gives rise to one large OM1 branch and a RPLB. There is some motion artifact but no obvious plaque. Other findings: Normal pulmonary vein drainage into the left atrium. Normal let atrial appendage without a thrombus. Normal size of the pulmonary artery. IMPRESSION: 1. Coronary calcium score of 71. This was 74th percentile for age and sex matched control. 2.  Normal coronary origin with co dominance. 3. Technically difficult study limited by significant motion artifact. There is mild to moderate atherosclerotic plaque in the proximal to mid LAD. CAD-RADS 3. 4. Consider symptom guided anti-ischemic and preventive pharmacotherapy as well as risk factor modification per guideline-directed care. 5.  Consider non-invasive testing for ischemia. 6.  This  study has been sent for FFR analysis. Fransico Him Electronically Signed   By: Fransico Him   On: 10/30/2019 14:01   Result Date: 10/30/2019 EXAM: OVER-READ INTERPRETATION  CT CHEST The following report is an over-read performed by radiologist Dr. Abigail Miyamoto of [facility], [place] on 10/30/2019. This over-read does not include interpretation of cardiac or coronary anatomy or pathology. The coronary CTA interpretation by the cardiologist is attached. COMPARISON:  Chest radiographs most recent 10/29/2019. Diagnostic chest CT 07/13/2019. FINDINGS: Vascular: Aortic atherosclerosis.  Tortuous thoracic aorta. No central pulmonary embolism, on this non-dedicated study. Mediastinum/Nodes: Prominent mediastinal nodes are similar to 07/13/2019. Example low right paratracheal node of 1.3 cm on 05/12. No hilar adenopathy. Lungs/Pleura: No pleural fluid. 5 mm left upper lobe pulmonary nodule on 15/13, similar on the most recent exam and in an area of airspace disease on the 07/02/2018 CT. Upper Abdomen: Hepatic steatosis. Normal imaged portions of the spleen, stomach. Musculoskeletal: Thoracic spondylosis. IMPRESSION: 1.  No acute findings in the imaged extracardiac chest. 2.  Aortic Atherosclerosis (ICD10-I70.0). 3. Left upper lobe 5 mm pulmonary nodule.  No follow-up needed if patient is low-risk. Non-contrast chest CT can be considered in 12 months if patient is high-risk. This recommendation follows the consensus statement: Guidelines for Management of Incidental Pulmonary Nodules Detected on CT Images: From the Fleischner Society 2017; Radiology 2017; 284:228-243. 4. Hepatic steatosis. 5. Similar prominent mediastinal nodes, incompletely imaged. Most likely reactive. Electronically Signed: By: Abigail Miyamoto M.D. On: 10/30/2019 12:26   DG Chest Portable 1 View  Result Date: 10/29/2019 CLINICAL DATA:  Chest pain. EXAM: PORTABLE CHEST 1 VIEW COMPARISON:  Chest CT 07/13/2019, chest radiograph 07/13/2019 FINDINGS:  Unchanged cardiomegaly. No airspace consolidation. Mild diffuse pulmonary interstitial prominence is unchanged. No evidence of pleural effusion or pneumothorax. No acute bony abnormality. Thoracic spondylosis with multilevel anterolateral osteophytes. Overlying cardiac monitoring leads. IMPRESSION: Unchanged cardiomegaly. Redemonstrated mild diffuse pulmonary interstitial prominence which may reflect interstitial edema. No airspace consolidation. Electronically Signed   By: Kellie Simmering DO   On: 10/29/2019 13:24     Labs:   Basic Metabolic Panel: Recent Labs  Lab 10/29/19 1207 10/30/19 0336  NA 141 137  K 3.3* 4.1  CL 103 100  CO2 27 27  GLUCOSE 111* 174*  BUN 14 22  CREATININE 1.23* 1.33*  CALCIUM 8.7* 8.7*   GFR Estimated Creatinine Clearance: 48.8 mL/min (A) (by C-G formula based on SCr of 1.33 mg/dL (H)). Liver Function Tests: No results for input(s): AST, ALT, ALKPHOS, BILITOT, PROT, ALBUMIN in the last 168 hours. No results for input(s): LIPASE, AMYLASE in the last 168 hours. No results for input(s): AMMONIA in the last 168 hours. Coagulation profile No results for input(s): INR, PROTIME in the last 168 hours.  CBC: Recent Labs  Lab 10/29/19 1207  WBC 6.4  HGB 12.3  HCT 38.0  MCV 89.4  PLT 320   Cardiac Enzymes: No results for input(s): CKTOTAL, CKMB, CKMBINDEX, TROPONINI in the last 168 hours. BNP: Invalid input(s): POCBNP CBG: No results for input(s): GLUCAP in the last 168 hours. D-Dimer No results for input(s): DDIMER in the last 72 hours. Hgb A1c No results for input(s): HGBA1C in the last 72 hours. Lipid Profile Recent Labs    10/30/19 0336  CHOL 256*  HDL 34*  LDLCALC 201*  TRIG 105  CHOLHDL 7.5   Thyroid function studies No results for input(s): TSH, T4TOTAL, T3FREE, THYROIDAB in the last 72 hours.  Invalid input(s): FREET3 Anemia work up No results for input(s): VITAMINB12, FOLATE, FERRITIN, TIBC, IRON, RETICCTPCT in the last 72  hours. Microbiology Recent Results (from the past 240 hour(s))  SARS CORONAVIRUS 2 (TAT 6-24 HRS) Nasopharyngeal Nasopharyngeal Swab     Status: None   Collection Time: 10/29/19  3:10 PM   Specimen: Nasopharyngeal Swab  Result Value Ref Range Status   SARS Coronavirus 2 NEGATIVE NEGATIVE Final    Comment: (NOTE) SARS-CoV-2 target nucleic acids are NOT DETECTED. The SARS-CoV-2 RNA is generally detectable in upper and lower respiratory specimens during the acute phase of infection. Negative results do not preclude SARS-CoV-2 infection, do not rule out co-infections with other pathogens, and should not be used as the sole basis for treatment or other patient management decisions. Negative results must be combined with clinical observations, patient history, and epidemiological information. The expected result is Negative. Fact Sheet for Patients: SugarRoll.be Fact Sheet for Healthcare Providers: https://www.woods-mathews.com/ This test is not yet approved or cleared by the [place] FDA and  has been authorized for detection and/or diagnosis of SARS-CoV-2 by FDA under an Emergency Use Authorization (EUA).  This EUA will remain  in effect (meaning this test can be used) for the duration of the COVID-19 declaration under Section 56 4(b)(1) of the Act, 21 U.S.C. section 360bbb-3(b)(1), unless the authorization is terminated or revoked sooner. Performed at [facility], [place] [address], [place], [place] [address]      Discharge Instructions:   Discharge Instructions    Diet - low sodium heart healthy   Complete by: As directed    Diet Carb Modified   Complete by: As directed    Discharge instructions   Complete by: As directed    In July 2020 your HgbA1c (average of 3 months of your blood sugars) was too high-- you need to be following a diabetic diet as well as low salt diet-- NO SODAS! Please establish with a PCP so they can  recheck your HgbA1c Losing weight will also help your blood sugar You can call your insurance company as well, sometimes they can help find a PCP as well as sometimes they have programs with nutritionist/exercise   Increase activity slowly   Complete by: As directed      Allergies as of 10/31/2019      Reactions   Morphine And Related Hives, Nausea And Vomiting   Penicillins Hives   Has patient had a PCN reaction causing immediate rash, facial/tongue/throat swelling, SOB or lightheadedness with hypotension: Yes Has patient had a PCN reaction causing severe rash involving mucus membranes or skin necrosis: No Has patient had a PCN reaction that required hospitalization: No Has patient had a PCN reaction occurring within the last 10 years: No If all of the above answers are "NO", then may proceed with Cephalosporin use.   Percocet [oxycodone-acetaminophen] Nausea And Vomiting   Ace Inhibitors Nausea And Vomiting, Cough   Iodinated Diagnostic Agents Nausea And Vomiting      Iodine Itching   Etodolac Nausea And Vomiting   Tramadol Nausea And Vomiting   Vicodin [hydrocodone-acetaminophen] Nausea And Vomiting      Medication List    STOP taking these medications   predniSONE 20 MG tablet Commonly known as: DELTASONE     TAKE these medications   acetaminophen 500 MG tablet Commonly known as: TYLENOL Take 1,000 mg by mouth every 6 (six) hours as needed for headache (pain).   albuterol 108 (90 Base) MCG/ACT inhaler Commonly known as: VENTOLIN HFA Inhale 2 puffs into the lungs every 6 (six) hours as needed for wheezing. What changed: reasons to take this   amLODipine 10 MG tablet Commonly known as: NORVASC Take 1 tablet (10 mg total) by mouth daily. What changed: how much to take   atorvastatin 80 MG tablet Commonly known as: LIPITOR Take 1 tablet (80 mg total) by mouth daily. What changed: when to take this   cloNIDine 0.3 MG tablet Commonly known as: CATAPRES Take 1  tablet (0.3 mg total) by mouth 2 (two) times daily.   clopidogrel 75 MG tablet Commonly known as: PLAVIX Take 1 tablet (75 mg total) by mouth daily.   furosemide 40 MG tablet Commonly known as: LASIX Take 1 tablet (40 mg total) by mouth 2 (two) times daily. What changed: when to take this   metoprolol tartrate 25 MG tablet Commonly known as: LOPRESSOR Take 0.5 tablets (12.5 mg total) by mouth 2 (two) times daily.   potassium chloride 10 MEQ tablet Commonly known as: KLOR-CON Take 2 tablets (20 mEq total) by mouth daily.   valsartan 320 MG tablet Commonly known as:  DIOVAN Take 320 mg by mouth daily. What changed: Another medication with the same name was removed. Continue taking this medication, and follow the directions you see here.      Follow-up Information    Sue Lush, PA-C.   Specialty: Physician Assistant Why: Please follow up in a week Contact information: [place] [id number] [phone]            Time coordinating discharge 25 min  Signed:  Geradine Girt DO  [facility] 10/31/2019, 4:09 PM

## 2019-10-31 NOTE — Progress Notes (Signed)
   CT FFR personally reviewed (formal report pending) - there is a proximal to mid-LAD flow dropoff to 0.79 - this is borderline significant. The FFR in the LAD is 0.82 after the described lesion. Based on this, I agree with more aggressive medical therapy as previously described. Can follow-up with Dr. Martinique after discharge to monitor symptoms. Troponins were negative.  [facility] will sign off.   Medication Recommendations:  Continue current meds Other recommendations (labs, testing, etc):  none Follow up as an outpatient:  Follow-up with Dr. Martinique  Elissa Grieshop C. Laylia Mui, MD, [facility], [place] Director of the [place] of the American Board of Clinical Lipidology Attending Cardiologist  Direct Dial: [phone]  Fax: [phone]  Website:  [email or web address]

## 2019-11-12 DIAGNOSIS — R079 Chest pain, unspecified: Secondary | ICD-10-CM | POA: Diagnosis not present

## 2019-11-21 ENCOUNTER — Encounter (HOSPITAL_COMMUNITY): Payer: Self-pay | Admitting: Emergency Medicine

## 2019-11-21 ENCOUNTER — Observation Stay (HOSPITAL_COMMUNITY): Payer: Medicare HMO

## 2019-11-21 ENCOUNTER — Emergency Department (HOSPITAL_COMMUNITY): Payer: Medicare HMO

## 2019-11-21 ENCOUNTER — Other Ambulatory Visit: Payer: Self-pay

## 2019-11-21 ENCOUNTER — Inpatient Hospital Stay (HOSPITAL_COMMUNITY)
Admission: EM | Admit: 2019-11-21 | Discharge: 2019-11-24 | DRG: 202 | Disposition: A | Discharge status: Home / Self Care | Payer: Medicare HMO | Attending: Internal Medicine | Admitting: Internal Medicine

## 2019-11-21 DIAGNOSIS — Z8249 Family history of ischemic heart disease and other diseases of the circulatory system: Secondary | ICD-10-CM

## 2019-11-21 DIAGNOSIS — J441 Chronic obstructive pulmonary disease with (acute) exacerbation: Secondary | ICD-10-CM | POA: Diagnosis not present

## 2019-11-21 DIAGNOSIS — Z6841 Body Mass Index (BMI) 40.0 and over, adult: Secondary | ICD-10-CM

## 2019-11-21 DIAGNOSIS — Z8673 Personal history of transient ischemic attack (TIA), and cerebral infarction without residual deficits: Secondary | ICD-10-CM

## 2019-11-21 DIAGNOSIS — E785 Hyperlipidemia, unspecified: Secondary | ICD-10-CM | POA: Diagnosis present

## 2019-11-21 DIAGNOSIS — E1165 Type 2 diabetes mellitus with hyperglycemia: Secondary | ICD-10-CM | POA: Diagnosis present

## 2019-11-21 DIAGNOSIS — I10 Essential (primary) hypertension: Secondary | ICD-10-CM | POA: Diagnosis not present

## 2019-11-21 DIAGNOSIS — R7989 Other specified abnormal findings of blood chemistry: Secondary | ICD-10-CM

## 2019-11-21 DIAGNOSIS — Z79899 Other long term (current) drug therapy: Secondary | ICD-10-CM | POA: Diagnosis not present

## 2019-11-21 DIAGNOSIS — Z96641 Presence of right artificial hip joint: Secondary | ICD-10-CM | POA: Diagnosis present

## 2019-11-21 DIAGNOSIS — I130 Hypertensive heart and chronic kidney disease with heart failure and stage 1 through stage 4 chronic kidney disease, or unspecified chronic kidney disease: Secondary | ICD-10-CM | POA: Diagnosis not present

## 2019-11-21 DIAGNOSIS — J449 Chronic obstructive pulmonary disease, unspecified: Secondary | ICD-10-CM

## 2019-11-21 DIAGNOSIS — J9601 Acute respiratory failure with hypoxia: Secondary | ICD-10-CM

## 2019-11-21 DIAGNOSIS — G4733 Obstructive sleep apnea (adult) (pediatric): Secondary | ICD-10-CM | POA: Diagnosis present

## 2019-11-21 DIAGNOSIS — Z885 Allergy status to narcotic agent status: Secondary | ICD-10-CM | POA: Diagnosis not present

## 2019-11-21 DIAGNOSIS — J189 Pneumonia, unspecified organism: Secondary | ICD-10-CM | POA: Diagnosis not present

## 2019-11-21 DIAGNOSIS — R0602 Shortness of breath: Secondary | ICD-10-CM | POA: Diagnosis present

## 2019-11-21 DIAGNOSIS — K219 Gastro-esophageal reflux disease without esophagitis: Secondary | ICD-10-CM | POA: Diagnosis present

## 2019-11-21 DIAGNOSIS — Z91041 Radiographic dye allergy status: Secondary | ICD-10-CM

## 2019-11-21 DIAGNOSIS — Z7902 Long term (current) use of antithrombotics/antiplatelets: Secondary | ICD-10-CM

## 2019-11-21 DIAGNOSIS — G4730 Sleep apnea, unspecified: Secondary | ICD-10-CM | POA: Diagnosis not present

## 2019-11-21 DIAGNOSIS — N1831 Chronic kidney disease, stage 3a: Secondary | ICD-10-CM | POA: Diagnosis not present

## 2019-11-21 DIAGNOSIS — H539 Unspecified visual disturbance: Secondary | ICD-10-CM | POA: Diagnosis present

## 2019-11-21 DIAGNOSIS — J440 Chronic obstructive pulmonary disease with acute lower respiratory infection: Secondary | ICD-10-CM | POA: Diagnosis present

## 2019-11-21 DIAGNOSIS — Z209 Contact with and (suspected) exposure to unspecified communicable disease: Secondary | ICD-10-CM | POA: Diagnosis not present

## 2019-11-21 DIAGNOSIS — M1990 Unspecified osteoarthritis, unspecified site: Secondary | ICD-10-CM | POA: Diagnosis present

## 2019-11-21 DIAGNOSIS — J45901 Unspecified asthma with (acute) exacerbation: Secondary | ICD-10-CM | POA: Diagnosis not present

## 2019-11-21 DIAGNOSIS — R079 Chest pain, unspecified: Secondary | ICD-10-CM | POA: Diagnosis not present

## 2019-11-21 DIAGNOSIS — R05 Cough: Secondary | ICD-10-CM | POA: Diagnosis not present

## 2019-11-21 DIAGNOSIS — N1832 Chronic kidney disease, stage 3b: Secondary | ICD-10-CM | POA: Diagnosis present

## 2019-11-21 DIAGNOSIS — Z880 Allergy status to penicillin: Secondary | ICD-10-CM

## 2019-11-21 DIAGNOSIS — I5033 Acute on chronic diastolic (congestive) heart failure: Secondary | ICD-10-CM | POA: Diagnosis not present

## 2019-11-21 DIAGNOSIS — Z20822 Contact with and (suspected) exposure to covid-19: Secondary | ICD-10-CM | POA: Diagnosis not present

## 2019-11-21 DIAGNOSIS — Z87891 Personal history of nicotine dependence: Secondary | ICD-10-CM

## 2019-11-21 DIAGNOSIS — R062 Wheezing: Secondary | ICD-10-CM | POA: Diagnosis not present

## 2019-11-21 LAB — CBC WITH DIFFERENTIAL/PLATELET
Abs Immature Granulocytes: 0.01 10*3/uL (ref 0.00–0.07)
Basophils Absolute: 0.1 10*3/uL (ref 0.0–0.1)
Basophils Relative: 1 %
Eosinophils Absolute: 0.5 10*3/uL (ref 0.0–0.5)
Eosinophils Relative: 7 %
HCT: 36.6 % (ref 36.0–46.0)
Hemoglobin: 12 g/dL (ref 12.0–15.0)
Immature Granulocytes: 0 %
Lymphocytes Relative: 41 %
Lymphs Abs: 2.9 10*3/uL (ref 0.7–4.0)
MCH: 29.5 pg (ref 26.0–34.0)
MCHC: 32.8 g/dL (ref 30.0–36.0)
MCV: 89.9 fL (ref 80.0–100.0)
Monocytes Absolute: 0.6 10*3/uL (ref 0.1–1.0)
Monocytes Relative: 8 %
Neutro Abs: 3 10*3/uL (ref 1.7–7.7)
Neutrophils Relative %: 43 %
Platelets: 322 10*3/uL (ref 150–400)
RBC: 4.07 MIL/uL (ref 3.87–5.11)
RDW: 14.6 % (ref 11.5–15.5)
WBC: 7.1 10*3/uL (ref 4.0–10.5)
nRBC: 0 % (ref 0.0–0.2)

## 2019-11-21 LAB — COMPREHENSIVE METABOLIC PANEL
ALT: 19 U/L (ref 0–44)
AST: 21 U/L (ref 15–41)
Albumin: 3.3 g/dL — ABNORMAL LOW (ref 3.5–5.0)
Alkaline Phosphatase: 75 U/L (ref 38–126)
Anion gap: 11 (ref 5–15)
BUN: 12 mg/dL (ref 8–23)
CO2: 27 mmol/L (ref 22–32)
Calcium: 8.7 mg/dL — ABNORMAL LOW (ref 8.9–10.3)
Chloride: 101 mmol/L (ref 98–111)
Creatinine, Ser: 1.2 mg/dL — ABNORMAL HIGH (ref 0.44–1.00)
GFR calc Af Amer: 53 mL/min — ABNORMAL LOW (ref >60)
GFR calc non Af Amer: 46 mL/min — ABNORMAL LOW (ref >60)
Glucose, Bld: 169 mg/dL — ABNORMAL HIGH (ref 70–99)
Potassium: 3.5 mmol/L (ref 3.5–5.1)
Sodium: 139 mmol/L (ref 135–145)
Total Bilirubin: 0.4 mg/dL (ref 0.3–1.2)
Total Protein: 7.1 g/dL (ref 6.5–8.1)

## 2019-11-21 LAB — POCT I-STAT 7, (LYTES, BLD GAS, ICA,H+H)
Acid-Base Excess: 3 mmol/L — ABNORMAL HIGH (ref 0.0–2.0)
Bicarbonate: 29.7 mmol/L — ABNORMAL HIGH (ref 20.0–28.0)
Calcium, Ion: 1.19 mmol/L (ref 1.15–1.40)
HCT: 33 % — ABNORMAL LOW (ref 36.0–46.0)
Hemoglobin: 11.2 g/dL — ABNORMAL LOW (ref 12.0–15.0)
O2 Saturation: 97 %
Patient temperature: 98.4
Potassium: 3.8 mmol/L (ref 3.5–5.1)
Sodium: 140 mmol/L (ref 135–145)
TCO2: 31 mmol/L (ref 22–32)
pCO2 arterial: 52.2 mmHg — ABNORMAL HIGH (ref 32.0–48.0)
pH, Arterial: 7.363 (ref 7.350–7.450)
pO2, Arterial: 94 mmHg (ref 83.0–108.0)

## 2019-11-21 LAB — TROPONIN I (HIGH SENSITIVITY)
Troponin I (High Sensitivity): 10 ng/L (ref <18)
Troponin I (High Sensitivity): 10 ng/L (ref <18)

## 2019-11-21 LAB — RESPIRATORY PANEL BY RT PCR (FLU A&B, COVID)
Influenza A by PCR: NEGATIVE
Influenza B by PCR: NEGATIVE
SARS Coronavirus 2 by RT PCR: NEGATIVE

## 2019-11-21 LAB — PROCALCITONIN: Procalcitonin: <0.1 ng/mL

## 2019-11-21 LAB — GLUCOSE, CAPILLARY: Glucose-Capillary: 272 mg/dL — ABNORMAL HIGH (ref 70–99)

## 2019-11-21 LAB — D-DIMER, QUANTITATIVE: D-Dimer, Quant: 2.05 ug/mL-FEU — ABNORMAL HIGH (ref 0.00–0.50)

## 2019-11-21 LAB — BRAIN NATRIURETIC PEPTIDE: B Natriuretic Peptide: 78.3 pg/mL (ref 0.0–100.0)

## 2019-11-21 MED ORDER — DIPHENHYDRAMINE HCL 50 MG/ML IJ SOLN: 50.0000 mg | Freq: Once | INTRAMUSCULAR | Status: AC

## 2019-11-21 MED ORDER — HYDROCORTISONE NA SUCCINATE PF 250 MG IJ SOLR
200.0000 mg | Freq: Once | INTRAMUSCULAR | Status: AC
  Administered 2019-11-21: 200 mg via INTRAVENOUS
  Filled 2019-11-21: qty 200

## 2019-11-21 MED ORDER — CLOPIDOGREL BISULFATE 75 MG PO TABS
75.0000 mg | ORAL_TABLET | Freq: Every day | ORAL | Status: DC
  Administered 2019-11-22 – 2019-11-24 (×3): 75 mg via ORAL
  Filled 2019-11-21 (×3): qty 1

## 2019-11-21 MED ORDER — IOHEXOL 350 MG/ML SOLN
100.0000 mL | Freq: Once | INTRAVENOUS | Status: AC | PRN
  Administered 2019-11-21: 100 mL via INTRAVENOUS

## 2019-11-21 MED ORDER — MOMETASONE FURO-FORMOTEROL FUM 200-5 MCG/ACT IN AERO
1.0000 | INHALATION_SPRAY | Freq: Two times a day (BID) | RESPIRATORY_TRACT | Status: DC
  Administered 2019-11-22 – 2019-11-24 (×5): 1 via RESPIRATORY_TRACT
  Filled 2019-11-21: qty 8.8

## 2019-11-21 MED ORDER — METOPROLOL TARTRATE 12.5 MG HALF TABLET
12.5000 mg | ORAL_TABLET | Freq: Two times a day (BID) | ORAL | Status: DC
  Administered 2019-11-21 – 2019-11-23 (×4): 12.5 mg via ORAL
  Filled 2019-11-21 (×4): qty 1

## 2019-11-21 MED ORDER — ATORVASTATIN CALCIUM 10 MG PO TABS
20.0000 mg | ORAL_TABLET | Freq: Every day | ORAL | Status: DC
  Administered 2019-11-22 – 2019-11-23 (×2): 20 mg via ORAL
  Filled 2019-11-21 (×2): qty 2

## 2019-11-21 MED ORDER — INSULIN ASPART 100 UNIT/ML ~~LOC~~ SOLN
0.0000 [IU] | Freq: Every day | SUBCUTANEOUS | Status: DC
  Administered 2019-11-21: 3 [IU] via SUBCUTANEOUS
  Administered 2019-11-22 – 2019-11-23 (×2): 2 [IU] via SUBCUTANEOUS

## 2019-11-21 MED ORDER — ALBUTEROL (5 MG/ML) CONTINUOUS INHALATION SOLN
15.0000 mg/h | INHALATION_SOLUTION | Freq: Once | RESPIRATORY_TRACT | Status: AC
  Administered 2019-11-21: 15 mg/h via RESPIRATORY_TRACT
  Filled 2019-11-21: qty 20

## 2019-11-21 MED ORDER — METHYLPREDNISOLONE SODIUM SUCC 125 MG IJ SOLR
80.0000 mg | Freq: Three times a day (TID) | INTRAMUSCULAR | Status: DC
  Administered 2019-11-21 – 2019-11-24 (×8): 80 mg via INTRAVENOUS
  Filled 2019-11-21 (×8): qty 2

## 2019-11-21 MED ORDER — DIPHENHYDRAMINE HCL 25 MG PO CAPS
50.0000 mg | ORAL_CAPSULE | Freq: Once | ORAL | Status: AC
  Administered 2019-11-21: 50 mg via ORAL
  Filled 2019-11-21: qty 2

## 2019-11-21 MED ORDER — ALBUTEROL SULFATE (2.5 MG/3ML) 0.083% IN NEBU: 2.5000 mg | INHALATION_SOLUTION | RESPIRATORY_TRACT | Status: DC | PRN

## 2019-11-21 MED ORDER — ENOXAPARIN SODIUM 40 MG/0.4ML ~~LOC~~ SOLN
40.0000 mg | SUBCUTANEOUS | Status: DC
  Administered 2019-11-21: 40 mg via SUBCUTANEOUS
  Filled 2019-11-21: qty 0.4

## 2019-11-21 MED ORDER — IPRATROPIUM BROMIDE 0.02 % IN SOLN
1.0000 mg | Freq: Once | RESPIRATORY_TRACT | Status: AC
  Administered 2019-11-21: 1 mg via RESPIRATORY_TRACT
  Filled 2019-11-21: qty 5

## 2019-11-21 MED ORDER — SODIUM CHLORIDE 0.9 % IV SOLN
100.0000 mg | Freq: Two times a day (BID) | INTRAVENOUS | Status: DC
  Administered 2019-11-21 – 2019-11-23 (×4): 100 mg via INTRAVENOUS
  Filled 2019-11-21 (×7): qty 100

## 2019-11-21 MED ORDER — ALBUTEROL SULFATE HFA 108 (90 BASE) MCG/ACT IN AERS
4.0000 | INHALATION_SPRAY | Freq: Once | RESPIRATORY_TRACT | Status: AC
  Administered 2019-11-21: 4 via RESPIRATORY_TRACT
  Filled 2019-11-21: qty 6.7

## 2019-11-21 MED ORDER — IPRATROPIUM-ALBUTEROL 0.5-2.5 (3) MG/3ML IN SOLN
3.0000 mL | Freq: Four times a day (QID) | RESPIRATORY_TRACT | Status: DC
  Administered 2019-11-22 (×4): 3 mL via RESPIRATORY_TRACT
  Filled 2019-11-21 (×3): qty 3

## 2019-11-21 MED ORDER — INSULIN ASPART 100 UNIT/ML ~~LOC~~ SOLN
0.0000 [IU] | Freq: Three times a day (TID) | SUBCUTANEOUS | Status: DC
  Administered 2019-11-22: 07:00:00 5 [IU] via SUBCUTANEOUS
  Administered 2019-11-22: 3 [IU] via SUBCUTANEOUS
  Administered 2019-11-22: 5 [IU] via SUBCUTANEOUS
  Administered 2019-11-23: 06:00:00 3 [IU] via SUBCUTANEOUS
  Administered 2019-11-23: 5 [IU] via SUBCUTANEOUS
  Administered 2019-11-23: 8 [IU] via SUBCUTANEOUS
  Administered 2019-11-24 (×2): 5 [IU] via SUBCUTANEOUS

## 2019-11-21 MED ORDER — FUROSEMIDE 10 MG/ML IJ SOLN
40.0000 mg | Freq: Once | INTRAMUSCULAR | Status: AC
  Administered 2019-11-21: 40 mg via INTRAVENOUS
  Filled 2019-11-21: qty 4

## 2019-11-21 MED ORDER — CLONIDINE HCL 0.3 MG PO TABS
0.3000 mg | ORAL_TABLET | Freq: Two times a day (BID) | ORAL | Status: DC
  Administered 2019-11-21 – 2019-11-24 (×6): 0.3 mg via ORAL
  Filled 2019-11-21 (×6): qty 1

## 2019-11-21 MED ORDER — AMLODIPINE BESYLATE 5 MG PO TABS
5.0000 mg | ORAL_TABLET | Freq: Every day | ORAL | Status: DC
  Administered 2019-11-22 – 2019-11-24 (×3): 5 mg via ORAL
  Filled 2019-11-21 (×3): qty 1

## 2019-11-21 NOTE — ED Triage Notes (Signed)
Per EMS: pt from home with C/O worsening SHOB and cough x4 days.  EMS administered PTA 0.3 EPI, 125 Solumedrol, 2gm Magnesium, and placed pt on 10L nonrebreather.     EMS vitals:   BP 197/94 HR 72 Sp02 10% on 10L

## 2019-11-21 NOTE — ED Notes (Signed)
Sent a secure message to pharmacy regarding medications.

## 2019-11-21 NOTE — ED Notes (Signed)
ED Provider at bedside. 

## 2019-11-21 NOTE — ED Provider Notes (Signed)
  Physical Exam  BP 139/74   Pulse 65   Temp 98.5 F (36.9 C) (Oral)   Resp (!) 22   Ht 5\' 4"  (1.626 m)   Wt 114.3 kg   SpO2 97%   BMI 43.26 kg/m   Physical Exam  ED Course/Procedures     Procedures  MDM  Care assumed at 3 PM.  Patient was recently admitted for shortness of breath and had a CT coronary study that was unremarkable.  Patient was thought to have some diastolic heart dysfunction.  Patient was fine at about 3 to 4 days ago where she had worsening shortness of breath and subjective fever. Signout pending Covid testing as well as ABG.  Patient will need admission since she is requiring oxygen.  5:16 PM D-dimer is 2, similar to previous.  Patient is still wheezing diffusely and her Covid is negative.  Patient is now started on continuous nebs.  Her ABG is reassuring but she has appears to be compensated chronic hypercapnia.  I talked to the hospitalist, Dr. Cyndia Skeeters, who request CTA chest. She has contrast dye allergy so will need premedication.    CRITICAL CARE Performed by: Wandra Arthurs   Total critical care time: 30 minutes  Critical care time was exclusive of separately billable procedures and treating other patients.  Critical care was necessary to treat or prevent imminent or life-threatening deterioration.  Critical care was time spent personally by me on the following activities: development of treatment plan with patient and/or surrogate as well as nursing, discussions with consultants, evaluation of patient's response to treatment, examination of patient, obtaining history from patient or surrogate, ordering and performing treatments and interventions, ordering and review of laboratory studies, ordering and review of radiographic studies, pulse oximetry and re-evaluation of patient's condition.     Drenda Freeze, MD 11/21/19 [phone]

## 2019-11-21 NOTE — H&P (Signed)
History and Physical    Sherry Fitzpatrick DVV:[id number] DOB: 1951-07-07 DOA: 11/21/2019  PCP: Patient, No Pcp Per-confirmed. Patient coming from: Home.  Independently ambulates at baseline.  Chief Complaint: Shortness of breath  HPI: Sherry Fitzpatrick is a 69 y.o. female with history of asthma, diastolic CHF, CKD-3, CVA without residual deficit, HTN, morbid obesity, osteoarthritis and remote tobacco use presenting with progressive shortness of breath.  Patient started experiencing shortness of breath 4 days ago that has gradually gotten worse.  Dyspnea worse with exertion.  She says it is hard to get up to walk but better once she started walking.  She has been out of his inhalers for about a week.  She also reports productive cough with "pinkish phlegm".  She has associated chest tightness.  Chest tightness is not necessarily exertional.  She denies radiation to her back, jaw, shoulder or arm.  Reports fever to 102 about 3 days ago.  She has not had further fever.  Reports some runny nose but denies sore throat.  She also noted some swelling in her feet.  She denies orthopnea or PND.  She used to see a Lasix every other day.  She denies calf tenderness.  She denies immobility, recent travel or history of blood clot.  She denies known Covid exposure or sick contacts.  Denies nausea, vomiting, abdominal pain or UTI symptoms.  Denies focal neuro symptoms.  Per EMS report to EDP, "respiratory distress on arrival with poor air movement.  Treated with NRB, Solu-Medrol, magnesium and epinephrine".  No documented or reported desaturation.   Lives with a daughter.  Quit smoking about 30 years ago.  Admits to occasional alcohol.  Denies recreational drug use.  She wishes to be full code.  In ED, stable.  Respiratory rate about 22.  Weaned from NRB to 4 L by [place].  Saturation in the range of 97 to 100%.  CBC and CMP without significant finding.  Troponin and BNP within normal range.  Initial EKG without acute  ischemic finding or abnormal interval.  COVID-19 and influenza PCR negative.  ABG with compensated respiratory acidosis.  D-dimer 2.05.  Portable CXR with hazy pulmonary opacities, mainly over hilar areas worse compared to previous CXR. Started on breathing treatments and hospitalist service was called for admission due to ongoing wheezing and respiratory distress.  Requested EDP to obtain CTA chest given elevated D-dimer.  CTA could also clarify CXR finding.  Patient has contrast allergy-and will be premedicated   ROS All review of system negative except for pertinent positives and negatives as history of present illness above.  PMH Past Medical History:  Diagnosis Date  . Alterations of sensations, late effect of cerebrovascular disease(438.6)   . Arthritis    "all over"  . Backache, unspecified   . Body mass index 40.0-44.9, adult ([place])   . Carpal tunnel syndrome   . Dizziness and giddiness   . Esophageal reflux   . Generalized pain   . Headache    "had alot til I had pituitary tumor removed"  . Heart murmur   . Other abnormal glucose   . Other and unspecified hyperlipidemia   . Other malaise and fatigue   . Other postprocedural status(V45.89)   . Other symptoms involving cardiovascular system   . Sleep apnea    "did test; they wanted me to get a CPAP but I never did get it" (04/14/2015) Does use inhaler at night (01 2021)  . Stroke ([place]) 06/2003; 02/2010   "minor;  minor" ; denies residual on 04/14/2015  . Unspecified disorder of the pituitary gland and its hypothalamic control   . Unspecified essential hypertension   . Unspecified visual disturbance    PSH Past Surgical History:  Procedure Laterality Date  . CARPAL TUNNEL RELEASE Left ~ 2014  . JOINT REPLACEMENT    . TOTAL HIP ARTHROPLASTY Right 11/06/2010  . TRANSPHENOIDAL / TRANSNASAL HYPOPHYSECTOMY / RESECTION PITUITARY TUMOR  06/13/2012  . TUBAL LIGATION  1974   Fam HX Family History  Problem Relation Age of Onset  .  Heart failure Mother   . Hypertension Mother   . Diabetes Father   . Hypertension Sister   . Heart failure Sister   . Hypertension Daughter    Social Hx  reports that she has never smoked. She has never used smokeless tobacco. She reports current alcohol use. She reports that she does not use drugs.  Allergy Allergies  Allergen Reactions  . Morphine And Related Hives and Nausea And Vomiting  . Penicillins Hives    Has patient had a PCN reaction causing immediate rash, facial/tongue/throat swelling, SOB or lightheadedness with hypotension: Yes Has patient had a PCN reaction causing severe rash involving mucus membranes or skin necrosis: No Has patient had a PCN reaction that required hospitalization: No Has patient had a PCN reaction occurring within the last 10 years: No If all of the above answers are "NO", then may proceed with Cephalosporin use.  Marland Kitchen Percocet [Oxycodone-Acetaminophen] Nausea And Vomiting  . Ace Inhibitors Nausea And Vomiting and Cough  . Iodinated Diagnostic Agents Nausea And Vomiting       . Iodine Itching  . Etodolac Nausea And Vomiting  . Tramadol Nausea And Vomiting  . Vicodin [Hydrocodone-Acetaminophen] Nausea And Vomiting   Home Meds Prior to Admission medications   Medication Sig Start Date End Date Taking? Authorizing Provider  acetaminophen (TYLENOL) 500 MG tablet Take 1,000 mg by mouth every 6 (six) hours as needed for headache (pain).    [provider]  albuterol (PROVENTIL HFA;VENTOLIN HFA) 108 (90 Base) MCG/ACT inhaler Inhale 2 puffs into the lungs every 6 (six) hours as needed for wheezing. Patient taking differently: Inhale 2 puffs into the lungs every 6 (six) hours as needed for wheezing or shortness of breath.  02/24/18   Danford, Suann Larry, MD  amLODipine (NORVASC) 10 MG tablet Take 1 tablet (10 mg total) by mouth daily. Patient taking differently: Take 5 mg by mouth daily.  05/07/19   Shelly Coss, MD  atorvastatin (LIPITOR) 80  MG tablet Take 1 tablet (80 mg total) by mouth daily. Patient taking differently: Take 80 mg by mouth once a week.  02/24/18   Danford, Suann Larry, MD  cloNIDine (CATAPRES) 0.3 MG tablet Take 1 tablet (0.3 mg total) by mouth 2 (two) times daily. 05/07/19   Shelly Coss, MD  clopidogrel (PLAVIX) 75 MG tablet Take 1 tablet (75 mg total) by mouth daily. 02/24/18   Danford, Suann Larry, MD  furosemide (LASIX) 40 MG tablet Take 1 tablet (40 mg total) by mouth 2 (two) times daily. Patient taking differently: Take 40 mg by mouth daily.  05/07/19 10/29/19  Shelly Coss, MD  metoprolol tartrate (LOPRESSOR) 25 MG tablet Take 0.5 tablets (12.5 mg total) by mouth 2 (two) times daily. 07/16/19   Marianna Payment, MD  potassium chloride (KLOR-CON) 10 MEQ tablet Take 2 tablets (20 mEq total) by mouth daily. 07/16/19   Marianna Payment, MD  valsartan (DIOVAN) 320 MG tablet Take 320 mg  by mouth daily. 08/26/19   [provider]    Physical Exam: Vitals:   11/21/19 1645 11/21/19 1700 11/21/19 1745 11/21/19 1800  BP: 140/69 133/65 138/70 (!) 130/52  Pulse: 63 64 77 77  Resp: (!) 21 (!) 21 17 (!) 21  Temp:      TempSrc:      SpO2: 100% 100% 100% 99%  Weight:      Height:        GENERAL: No acute distress.  Appears well.  HEENT: MMM.  Vision and hearing grossly intact.  NECK: Supple.  No apparent JVD.  RESP: On breathing treatments.  Speaks in full sentences.  No increased work of breathing.  Rhonchi bilaterally. CVS:  RRR. Heart sounds normal.  ABD/GI/GU: Bowel sounds present. Soft. Non tender.  MSK/EXT:  Moves extremities. Trace edema bilaterally.  No calf tenderness or swelling. SKIN: no apparent skin lesion or wound NEURO: Awake, alert and oriented appropriately.  No gross deficit.  PSYCH: Calm. Normal affect.   Personally Reviewed Radiological Exams DG Chest Port 1 View  Result Date: 11/21/2019 CLINICAL DATA:  Shortness of breath and cough for 4 days. EXAM: PORTABLE CHEST 1 VIEW  COMPARISON:  Single-view of the chest 10/29/2019. CT chest 07/13/2019. FINDINGS: There is cardiomegaly and atherosclerosis. Diffuse hazy pulmonary opacities are present bilaterally. No pneumothorax or pleural effusion. IMPRESSION: Hazy pulmonary opacities could be due to edema or pneumonia. Cardiomegaly. Atherosclerosis. Electronically Signed   By: Inge Rise M.D.   On: 11/21/2019 14:41     Personally Reviewed Labs: CBC: Recent Labs  Lab 11/21/19 1432 11/21/19 1555  WBC 7.1  --   NEUTROABS 3.0  --   HGB 12.0 11.2*  HCT 36.6 33.0*  MCV 89.9  --   PLT 322  --    Basic Metabolic Panel: Recent Labs  Lab 11/21/19 1432 11/21/19 1555  NA 139 140  K 3.5 3.8  CL 101  --   CO2 27  --   GLUCOSE 169*  --   BUN 12  --   CREATININE 1.20*  --   CALCIUM 8.7*  --    GFR: Estimated Creatinine Clearance: 54.1 mL/min (A) (by C-G formula based on SCr of 1.2 mg/dL (H)). Liver Function Tests: Recent Labs  Lab 11/21/19 1432  AST 21  ALT 19  ALKPHOS 75  BILITOT 0.4  PROT 7.1  ALBUMIN 3.3*   No results for input(s): LIPASE, AMYLASE in the last 168 hours. No results for input(s): AMMONIA in the last 168 hours. Coagulation Profile: No results for input(s): INR, PROTIME in the last 168 hours. Cardiac Enzymes: No results for input(s): CKTOTAL, CKMB, CKMBINDEX, TROPONINI in the last 168 hours. BNP (last 3 results) No results for input(s): PROBNP in the last 8760 hours. HbA1C: No results for input(s): HGBA1C in the last 72 hours. CBG: No results for input(s): GLUCAP in the last 168 hours. Lipid Profile: No results for input(s): CHOL, HDL, LDLCALC, TRIG, CHOLHDL, LDLDIRECT in the last 72 hours. Thyroid Function Tests: No results for input(s): TSH, T4TOTAL, FREET4, T3FREE, THYROIDAB in the last 72 hours. Anemia Panel: No results for input(s): VITAMINB12, FOLATE, FERRITIN, TIBC, IRON, RETICCTPCT in the last 72 hours. Urine analysis:    Component Value Date/Time   COLORURINE YELLOW  10/29/2019 1604   APPEARANCEUR HAZY (A) 10/29/2019 1604   LABSPEC 1.018 10/29/2019 1604   PHURINE 7.0 10/29/2019 [place] 10/29/2019 [place] 10/29/2019 [place] 10/29/2019 1604  KETONESUR NEGATIVE 10/29/2019 1604   PROTEINUR 30 (A) 10/29/2019 1604   UROBILINOGEN 0.2 04/14/2015 1451   NITRITE NEGATIVE 10/29/2019 1604   LEUKOCYTESUR NEGATIVE 10/29/2019 1604    Sepsis Labs:  None.  No leukocytosis.  Personally Reviewed EKG:  Normal sinus rhythm without acute ischemic finding or abnormal intervals.  Assessment/Plan Asthma/COPD exacerbation: has dyspnea with productive cough consistent with COPD exacerbation.  She has history of asthma but no formal diagnosis of COPD.  ABG consistent with obstructive etiology.  She has been out of her inhalers for about a week.  Others on differential include community-acquired pneumonia, PE and diastolic CHF exacerbation.  -COPD pathway with Solu-Medrol/antibiotic/nebulizers/incentive spirometry/flutter valve -Minimal oxygen to keep saturation about 90% -CTA chest to exclude PE given elevated D-dimer although this seems to be chronic -Check procalcitonin-if markedly elevated, suggest escalating antibiotic to Levaquin.  Allergic to penicillin -Will give trial of IV Lasix. -Daily ambulation  Chronic diastolic CHF: Echo in 06/2445 with EF of 55 to 60% but  no significant finding.  Has trace edema but no overt signs of fluid overload.  BNP within normal range.  -Trial of IV Lasix 40 mg once as above -Monitor fluid status and renal function  Chest tightness: Atypical.  Likely from COPD versus ACS.  EKG without acute ischemic finding.  High-sensitivity troponin 10 x2 -Manage COPD as above  DM-2 with hyperglycemia: A1c 6.9% on 05/06/2019.  Not on medication at home. No results for input(s): GLUCAP in the last 72 hours.  -Check hemoglobin A1c -Initiate SSI and statin.  CKD-3: Stable. -Continue monitoring  as appropriate  Essential hypertension: Stable. -Continue home medication after med rec  History of CVA without residual deficit -Continue home medications after med rec  OSA -Nightly CPAP.  Morbid obesity: Body mass index is 43.26 kg/m. -Encourage lifestyle change to lose weight.  DVT prophylaxis: Lovenox  Code Status: Full code Family Communication: Updated patient's daughter over the phone  Disposition Plan: Medical telemetry Consults called: None Admission status: Observation   Mercy Riding MD [facility]  If 7PM-7AM, please contact night-coverage [email or web address] Password [facility]  11/21/2019, 6:19 PM

## 2019-11-21 NOTE — ED Notes (Signed)
ED TO INPATIENT HANDOFF REPORT  ED Nurse Name and Phone #:   S Name/Age/Gender Sherry Fitzpatrick 68 y.o. female Room/Bed: 028C/028C  Code Status   Code Status: Prior  Home/SNF/Other Home Patient oriented to: self, place, time and situation Is this baseline? Yes   Triage Complete: Triage complete  Chief Complaint Shortness of breath [R06.02]  Triage Note Per EMS: pt from home with C/O worsening SHOB and cough x4 days.  EMS administered PTA 0.3 EPI, 125 Solumedrol, 2gm Magnesium, and placed pt on 10L nonrebreather.     EMS vitals:   BP 197/94 HR 72 Sp02 10% on 10L     Allergies Allergies  Allergen Reactions  . Morphine And Related Hives and Nausea And Vomiting  . Penicillins Hives    Has patient had a PCN reaction causing immediate rash, facial/tongue/throat swelling, SOB or lightheadedness with hypotension: Yes Has patient had a PCN reaction causing severe rash involving mucus membranes or skin necrosis: No Has patient had a PCN reaction that required hospitalization: No Has patient had a PCN reaction occurring within the last 10 years: No If all of the above answers are "NO", then may proceed with Cephalosporin use.  Marland Kitchen Percocet [Oxycodone-Acetaminophen] Nausea And Vomiting  . Ace Inhibitors Nausea And Vomiting and Cough  . Iodinated Diagnostic Agents Nausea And Vomiting       . Iodine Itching  . Etodolac Nausea And Vomiting  . Tramadol Nausea And Vomiting  . Vicodin [Hydrocodone-Acetaminophen] Nausea And Vomiting    Level of Care/Admitting Diagnosis ED Disposition    ED Disposition Condition [facility] Area: [place] [[id number]]  Level of Care: Telemetry Medical [104]  I expect the patient will be discharged within 24 hours: No (not a candidate for 5C-Observation unit)  Covid Evaluation: Confirmed COVID Negative  Date Laboratory Confirmed COVID Negative: 11/21/2019  Diagnosis: Shortness of breath [786.05.ICD-9-CM]  Admitting  Physician: Mercy Riding [[id number]]  Attending Physician: Mercy Riding [[id number]]       B Medical/Surgery History Past Medical History:  Diagnosis Date  . Alterations of sensations, late effect of cerebrovascular disease(438.6)   . Arthritis    "all over"  . Backache, unspecified   . Body mass index 40.0-44.9, adult ([place])   . Carpal tunnel syndrome   . Dizziness and giddiness   . Esophageal reflux   . Generalized pain   . Headache    "had alot til I had pituitary tumor removed"  . Heart murmur   . Other abnormal glucose   . Other and unspecified hyperlipidemia   . Other malaise and fatigue   . Other postprocedural status(V45.89)   . Other symptoms involving cardiovascular system   . Sleep apnea    "did test; they wanted me to get a CPAP but I never did get it" (04/14/2015) Does use inhaler at night (01 2021)  . Stroke ([place]) 06/2003; 02/2010   "minor; minor" ; denies residual on 04/14/2015  . Unspecified disorder of the pituitary gland and its hypothalamic control   . Unspecified essential hypertension   . Unspecified visual disturbance    Past Surgical History:  Procedure Laterality Date  . CARPAL TUNNEL RELEASE Left ~ 2014  . JOINT REPLACEMENT    . TOTAL HIP ARTHROPLASTY Right 11/06/2010  . TRANSPHENOIDAL / TRANSNASAL HYPOPHYSECTOMY / RESECTION PITUITARY TUMOR  06/13/2012  . TUBAL LIGATION  1974     A IV Location/Drains/Wounds Patient Lines/Drains/Airways Status   Active Line/Drains/Airways  Name:   Placement date:   Placement time:   Site:   Days:   Peripheral IV 11/21/19 Right Hand   11/21/19    1405    Hand   less than 1   Peripheral IV 11/21/19 Right Antecubital   11/21/19    1433    Antecubital   less than 1          Intake/Output Last 24 hours No intake or output data in the 24 hours ending 11/21/19 1729  Labs/Imaging Results for orders placed or performed during the hospital encounter of 11/21/19 (from the past 48 hour(s))  Respiratory Panel by RT PCR  (Flu A&B, Covid) - Nasopharyngeal Swab     Status: None   Collection Time: 11/21/19  1:52 PM   Specimen: Nasopharyngeal Swab  Result Value Ref Range   SARS Coronavirus 2 by RT PCR NEGATIVE NEGATIVE    Comment: (NOTE) SARS-CoV-2 target nucleic acids are NOT DETECTED. The SARS-CoV-2 RNA is generally detectable in upper respiratoy specimens during the acute phase of infection. The lowest concentration of SARS-CoV-2 viral copies this assay can detect is 131 copies/mL. A negative result does not preclude SARS-Cov-2 infection and should not be used as the sole basis for treatment or other patient management decisions. A negative result may occur with  improper specimen collection/handling, submission of specimen other than nasopharyngeal swab, presence of viral mutation(s) within the areas targeted by this assay, and inadequate number of viral copies (<131 copies/mL). A negative result must be combined with clinical observations, patient history, and epidemiological information. The expected result is Negative. Fact Sheet for Patients:  PinkCheek.be Fact Sheet for Healthcare Providers:  GravelBags.it This test is not yet ap proved or cleared by the [place] FDA and  has been authorized for detection and/or diagnosis of SARS-CoV-2 by FDA under an Emergency Use Authorization (EUA). This EUA will remain  in effect (meaning this test can be used) for the duration of the COVID-19 declaration under Section 564(b)(1) of the Act, 21 U.S.C. section 360bbb-3(b)(1), unless the authorization is terminated or revoked sooner.    Influenza A by PCR NEGATIVE NEGATIVE   Influenza B by PCR NEGATIVE NEGATIVE    Comment: (NOTE) The Xpert Xpress SARS-CoV-2/FLU/RSV assay is intended as an aid in  the diagnosis of influenza from Nasopharyngeal swab specimens and  should not be used as a sole basis for treatment. Nasal washings and  aspirates are  unacceptable for Xpert Xpress SARS-CoV-2/FLU/RSV  testing. Fact Sheet for Patients: PinkCheek.be Fact Sheet for Healthcare Providers: GravelBags.it This test is not yet approved or cleared by the [place] FDA and  has been authorized for detection and/or diagnosis of SARS-CoV-2 by  FDA under an Emergency Use Authorization (EUA). This EUA will remain  in effect (meaning this test can be used) for the duration of the  Covid-19 declaration under Section 564(b)(1) of the Act, 21  U.S.C. section 360bbb-3(b)(1), unless the authorization is  terminated or revoked. Performed at [facility], [place] [address] [place], [place] [address]   Comprehensive metabolic panel     Status: Abnormal   Collection Time: 11/21/19  2:32 PM  Result Value Ref Range   Sodium 139 135 - 145 mmol/L   Potassium 3.5 3.5 - 5.1 mmol/L   Chloride 101 98 - 111 mmol/L   CO2 27 22 - 32 mmol/L   Glucose, Bld 169 (H) 70 - 99 mg/dL   BUN 12 8 - 23 mg/dL   Creatinine, Ser  1.20 (H) 0.44 - 1.00 mg/dL   Calcium 8.7 (L) 8.9 - 10.3 mg/dL   Total Protein 7.1 6.5 - 8.1 g/dL   Albumin 3.3 (L) 3.5 - 5.0 g/dL   AST 21 15 - 41 U/L   ALT 19 0 - 44 U/L   Alkaline Phosphatase 75 38 - 126 U/L   Total Bilirubin 0.4 0.3 - 1.2 mg/dL   GFR calc non Af Amer 46 (L) >60 mL/min   GFR calc Af Amer 53 (L) >60 mL/min   Anion gap 11 5 - 15    Comment: Performed at [facility] [address] [place], [place] [address]  CBC with Differential     Status: None   Collection Time: 11/21/19  2:32 PM  Result Value Ref Range   WBC 7.1 4.0 - 10.5 K/uL   RBC 4.07 3.87 - 5.11 MIL/uL   Hemoglobin 12.0 12.0 - 15.0 g/dL   HCT 36.6 36.0 - 46.0 %   MCV 89.9 80.0 - 100.0 fL   MCH 29.5 26.0 - 34.0 pg   MCHC 32.8 30.0 - 36.0 g/dL   RDW 14.6 11.5 - 15.5 %   Platelets 322 150 - 400 K/uL   nRBC 0.0 0.0 - 0.2 %   Neutrophils Relative % 43 %   Neutro Abs 3.0 1.7 - 7.7 K/uL    Lymphocytes Relative 41 %   Lymphs Abs 2.9 0.7 - 4.0 K/uL   Monocytes Relative 8 %   Monocytes Absolute 0.6 0.1 - 1.0 K/uL   Eosinophils Relative 7 %   Eosinophils Absolute 0.5 0.0 - 0.5 K/uL   Basophils Relative 1 %   Basophils Absolute 0.1 0.0 - 0.1 K/uL   Immature Granulocytes 0 %   Abs Immature Granulocytes 0.01 0.00 - 0.07 K/uL    Comment: Performed at [facility], [address] [place], [place] [address]  Troponin I (High Sensitivity)     Status: None   Collection Time: 11/21/19  2:32 PM  Result Value Ref Range   Troponin I (High Sensitivity) 10 <18 ng/L    Comment: (NOTE) Elevated high sensitivity troponin I (hsTnI) values and significant  changes across serial measurements may suggest ACS but many other  chronic and acute conditions are known to elevate hsTnI results.  Refer to the "Links" section for chest pain algorithms and additional  guidance. Performed at [facility], [place] [address], [place], [place] [address]   Brain natriuretic peptide     Status: None   Collection Time: 11/21/19  2:32 PM  Result Value Ref Range   B Natriuretic Peptide 78.3 0.0 - 100.0 pg/mL    Comment: Performed at [facility] [address], [place], [place] [address]  D-dimer, quantitative     Status: Abnormal   Collection Time: 11/21/19  2:32 PM  Result Value Ref Range   D-Dimer, Quant 2.05 (H) 0.00 - 0.50 ug/mL-FEU    Comment: (NOTE) At the manufacturer cut-off of 0.50 ug/mL FEU, this assay has been documented to exclude PE with a sensitivity and negative predictive value of 97 to 99%.  At this time, this assay has not been approved by the FDA to exclude DVT/VTE. Results should be correlated with clinical presentation. Performed at [facility], [place] [address], [place], [place] [address]   I-STAT 7, (LYTES, BLD GAS, ICA, H+H)     Status: Abnormal   Collection Time: 11/21/19  3:55 PM  Result Value Ref  Range   pH, Arterial 7.363 7.350 - 7.450   pCO2  arterial 52.2 (H) 32.0 - 48.0 mmHg   pO2, Arterial 94.0 83.0 - 108.0 mmHg   Bicarbonate 29.7 (H) 20.0 - 28.0 mmol/L   TCO2 31 22 - 32 mmol/L   O2 Saturation 97.0 %   Acid-Base Excess 3.0 (H) 0.0 - 2.0 mmol/L   Sodium 140 135 - 145 mmol/L   Potassium 3.8 3.5 - 5.1 mmol/L   Calcium, Ion 1.19 1.15 - 1.40 mmol/L   HCT 33.0 (L) 36.0 - 46.0 %   Hemoglobin 11.2 (L) 12.0 - 15.0 g/dL   Patient temperature 98.4 F    Collection site RADIAL, ALLEN'S TEST ACCEPTABLE    Drawn by Operator    Sample type ARTERIAL   Troponin I (High Sensitivity)     Status: None   Collection Time: 11/21/19  4:25 PM  Result Value Ref Range   Troponin I (High Sensitivity) 10 <18 ng/L    Comment: (NOTE) Elevated high sensitivity troponin I (hsTnI) values and significant  changes across serial measurements may suggest ACS but many other  chronic and acute conditions are known to elevate hsTnI results.  Refer to the "Links" section for chest pain algorithms and additional  guidance. Performed at [facility], [place] [address] [place],  [address]    DG Chest Port 1 View  Result Date: 11/21/2019 CLINICAL DATA:  Shortness of breath and cough for 4 days. EXAM: PORTABLE CHEST 1 VIEW COMPARISON:  Single-view of the chest 10/29/2019. CT chest 07/13/2019. FINDINGS: There is cardiomegaly and atherosclerosis. Diffuse hazy pulmonary opacities are present bilaterally. No pneumothorax or pleural effusion. IMPRESSION: Hazy pulmonary opacities could be due to edema or pneumonia. Cardiomegaly. Atherosclerosis. Electronically Signed   By: Inge Rise M.D.   On: 11/21/2019 14:41    Pending Labs Unresulted Labs (From admission, onward)    Start     Ordered   11/22/19 0500  Procalcitonin  Daily,   R     11/21/19 1719   11/21/19 1728  Procalcitonin - Baseline  Add-on,   AD     11/21/19 1727   11/21/19 1509  Blood gas, arterial  Once,   STAT     11/21/19 1508          Vitals/Pain Today's Vitals   11/21/19 1624  11/21/19 1630 11/21/19 1645 11/21/19 1700  BP:  139/74 140/69 133/65  Pulse:  65 63 64  Resp:  (!) 22 (!) 21 (!) 21  Temp:      TempSrc:      SpO2:  97% 100% 100%  Weight:      Height:      PainSc: Asleep       Isolation Precautions Airborne and Contact precautions  Medications Medications  hydrocortisone sodium succinate (SOLU-CORTEF) injection 200 mg (has no administration in time range)  diphenhydrAMINE (BENADRYL) capsule 50 mg (has no administration in time range)    Or  diphenhydrAMINE (BENADRYL) injection 50 mg (has no administration in time range)  doxycycline (VIBRAMYCIN) 100 mg in sodium chloride 0.9 % 250 mL IVPB (has no administration in time range)  albuterol (VENTOLIN HFA) 108 (90 Base) MCG/ACT inhaler 4 puff (4 puffs Inhalation Given 11/21/19 1411)  albuterol (PROVENTIL,VENTOLIN) solution continuous neb (15 mg/hr Nebulization Given 11/21/19 1646)  ipratropium (ATROVENT) nebulizer solution 1 mg (1 mg Nebulization Given 11/21/19 1645)    Mobility walks Moderate fall risk   Focused Assessments Pulmonary Assessment Handoff:  Lung sounds: Bilateral  Breath Sounds: Expiratory wheezes, Rhonchi L Breath Sounds: Expiratory wheezes R Breath Sounds: Expiratory wheezes O2 Device: Aerosol Mask O2 Flow Rate (L/min): 10 L/min      R Recommendations: See Admitting Provider Note  Report given to:   Additional Notes: [place] 2L 97%

## 2019-11-21 NOTE — ED Notes (Signed)
Report given to 3E12 RN

## 2019-11-21 NOTE — ED Provider Notes (Signed)
[facility] EMERGENCY DEPARTMENT Provider Note   CSN: [id number] Arrival date & time: 11/21/19  1345     History Chief Complaint  Patient presents with  . Cough  . Shortness of Breath    Sherry Fitzpatrick is a 69 y.o. female.  The history is provided by the patient, the EMS personnel and medical records. No language interpreter was used.  Cough Associated symptoms: shortness of breath   Shortness of Breath Associated symptoms: cough    Sherry Fitzpatrick is a 69 y.o. female who presents to the Emergency Department complaining of shortness of breath. She presents the emergency department by EMS for evaluation of 3 to 4 days of shortness of breath. She has associated chest tightness and soreness. She has a cough productive of yellow mucus as well as occasionally blood-tinged. She previously used inhaler at home for similar symptoms but she no longer has it. She reports of fever to 102 3 days ago, none since then. She denies any nausea, vomiting, abdominal pain, leg swelling or pain. EMS reports respiratory distress on their arrival with poor air movement. She was treated with non-rebreather, Solu-Medrol, magnesium, epinephrine. They reports that her breathing has improved since receiving medications but some of her wheezing is starting to return. No reports of hypoxia prior to ED arrival.    Past Medical History:  Diagnosis Date  . Alterations of sensations, late effect of cerebrovascular disease(438.6)   . Arthritis    "all over"  . Backache, unspecified   . Body mass index 40.0-44.9, adult ([place])   . Carpal tunnel syndrome   . Dizziness and giddiness   . Esophageal reflux   . Generalized pain   . Headache    "had alot til I had pituitary tumor removed"  . Heart murmur   . Other abnormal glucose   . Other and unspecified hyperlipidemia   . Other malaise and fatigue   . Other postprocedural status(V45.89)   . Other symptoms involving cardiovascular system   .  Sleep apnea    "did test; they wanted me to get a CPAP but I never did get it" (04/14/2015) Does use inhaler at night (01 2021)  . Stroke ([place]) 06/2003; 02/2010   "minor; minor" ; denies residual on 04/14/2015  . Unspecified disorder of the pituitary gland and its hypothalamic control   . Unspecified essential hypertension   . Unspecified visual disturbance     Patient Active Problem List   Diagnosis Date Noted  . Morbid obesity ([place]) 10/29/2019  . CKD (chronic kidney disease), stage III 10/29/2019  . Reactive airway disease without complication   . Dyspnea 07/13/2019  . Acute ischemic stroke ([place]) 05/05/2019  . AKI (acute kidney injury) ([place]) 07/03/2018  . Hypertensive crisis 07/01/2018  . Tobacco abuse 07/01/2018  . Hypokalemia 07/01/2018  . Chronic diastolic CHF (congestive heart failure) ([place]) 07/01/2018  . Positive D dimer   . Hypertensive urgency 02/22/2018  . Malignant hypertension 02/20/2018  . Shortness of breath 02/20/2018  . Stroke ([place])   . Chest pain 04/14/2015  . Right sided weakness 04/14/2015  . Hyperlipidemia   . Body mass index 40.0-44.9, adult ([place])   . Essential hypertension   . Blood glucose elevated   . Asthma   . OSA (obstructive sleep apnea) 05/06/2013    Past Surgical History:  Procedure Laterality Date  . CARPAL TUNNEL RELEASE Left ~ 2014  . JOINT REPLACEMENT    . TOTAL HIP ARTHROPLASTY Right 11/06/2010  . TRANSPHENOIDAL /  TRANSNASAL HYPOPHYSECTOMY / RESECTION PITUITARY TUMOR  06/13/2012  . TUBAL LIGATION  1974     OB History   No obstetric history on file.     Family History  Problem Relation Age of Onset  . Heart failure Mother   . Hypertension Mother   . Diabetes Father   . Hypertension Sister   . Heart failure Sister   . Hypertension Daughter     Social History   Tobacco Use  . Smoking status: Never Smoker  . Smokeless tobacco: Never Used  Substance Use Topics  . Alcohol use: Yes    Alcohol/week: 0.0 standard drinks     Comment: rarely; "I don't drink.  But I like it."  . Drug use: No    Home Medications Prior to Admission medications   Medication Sig Start Date End Date Taking? Authorizing Provider  acetaminophen (TYLENOL) 500 MG tablet Take 1,000 mg by mouth every 6 (six) hours as needed for headache (pain).    [provider]  albuterol (PROVENTIL HFA;VENTOLIN HFA) 108 (90 Base) MCG/ACT inhaler Inhale 2 puffs into the lungs every 6 (six) hours as needed for wheezing. Patient taking differently: Inhale 2 puffs into the lungs every 6 (six) hours as needed for wheezing or shortness of breath.  02/24/18   Danford, Suann Larry, MD  amLODipine (NORVASC) 10 MG tablet Take 1 tablet (10 mg total) by mouth daily. Patient taking differently: Take 5 mg by mouth daily.  05/07/19   Shelly Coss, MD  atorvastatin (LIPITOR) 80 MG tablet Take 1 tablet (80 mg total) by mouth daily. Patient taking differently: Take 80 mg by mouth once a week.  02/24/18   Danford, Suann Larry, MD  cloNIDine (CATAPRES) 0.3 MG tablet Take 1 tablet (0.3 mg total) by mouth 2 (two) times daily. 05/07/19   Shelly Coss, MD  clopidogrel (PLAVIX) 75 MG tablet Take 1 tablet (75 mg total) by mouth daily. 02/24/18   Danford, Suann Larry, MD  furosemide (LASIX) 40 MG tablet Take 1 tablet (40 mg total) by mouth 2 (two) times daily. Patient taking differently: Take 40 mg by mouth daily.  05/07/19 10/29/19  Shelly Coss, MD  metoprolol tartrate (LOPRESSOR) 25 MG tablet Take 0.5 tablets (12.5 mg total) by mouth 2 (two) times daily. 07/16/19   Marianna Payment, MD  potassium chloride (KLOR-CON) 10 MEQ tablet Take 2 tablets (20 mEq total) by mouth daily. 07/16/19   Marianna Payment, MD  valsartan (DIOVAN) 320 MG tablet Take 320 mg by mouth daily. 08/26/19   [provider]    Allergies    Morphine and related, Penicillins, Percocet [oxycodone-acetaminophen], Ace inhibitors, Iodinated diagnostic agents, Iodine, Etodolac, Tramadol, and Vicodin  [hydrocodone-acetaminophen]  Review of Systems   Review of Systems  Respiratory: Positive for cough and shortness of breath.   All other systems reviewed and are negative.   Physical Exam Updated Vital Signs BP (!) 151/72   Pulse (!) 25   Temp 98.5 F (36.9 C) (Oral)   Resp (!) 21   Ht 5\' 4"  (1.626 m)   Wt 114.3 kg   SpO2 99%   BMI 43.26 kg/m   Physical Exam Vitals and nursing note reviewed.  Constitutional:      Appearance: She is well-developed.  HENT:     Head: Normocephalic and atraumatic.  Cardiovascular:     Rate and Rhythm: Normal rate and regular rhythm.     Heart sounds: No murmur.  Pulmonary:     Comments: Tachypnea. Diffuse wheezes and  rhonchi. Abdominal:     Palpations: Abdomen is soft.     Tenderness: There is no abdominal tenderness. There is no guarding or rebound.  Musculoskeletal:        General: No swelling or tenderness.  Skin:    General: Skin is warm and dry.  Neurological:     Mental Status: She is alert and oriented to person, place, and time.  Psychiatric:        Behavior: Behavior normal.     ED Results / Procedures / Treatments   Labs (all labs ordered are listed, but only abnormal results are displayed) Labs Reviewed  D-DIMER, QUANTITATIVE (NOT AT [facility]) - Abnormal; Notable for the following components:      Result Value   D-Dimer, Quant 2.05 (*)    All other components within normal limits  RESPIRATORY PANEL BY RT PCR (FLU A&B, COVID)  CBC WITH DIFFERENTIAL/PLATELET  COMPREHENSIVE METABOLIC PANEL  BRAIN NATRIURETIC PEPTIDE  BLOOD GAS, ARTERIAL  TROPONIN I (HIGH SENSITIVITY)    EKG EKG Interpretation  Date/Time:  Saturday November 21 2019 14:12:16 EST Ventricular Rate:  62 PR Interval:    QRS Duration: 80 QT Interval:  430 QTC Calculation: 436 R Axis:   68 Text Interpretation: Sinus rhythm Septal infarct , age undetermined ST & T wave abnormality, consider inferolateral ischemia Abnormal ECG ST changes present on prior  EKG 10/29/19 Confirmed by Quintella Reichert [id number]) on 11/21/2019 2:21:07 PM   Radiology DG Chest Port 1 View  Result Date: 11/21/2019 CLINICAL DATA:  Shortness of breath and cough for 4 days. EXAM: PORTABLE CHEST 1 VIEW COMPARISON:  Single-view of the chest 10/29/2019. CT chest 07/13/2019. FINDINGS: There is cardiomegaly and atherosclerosis. Diffuse hazy pulmonary opacities are present bilaterally. No pneumothorax or pleural effusion. IMPRESSION: Hazy pulmonary opacities could be due to edema or pneumonia. Cardiomegaly. Atherosclerosis. Electronically Signed   By: Inge Rise M.D.   On: 11/21/2019 14:41    Procedures Procedures (including critical care time)  Medications Ordered in ED Medications  albuterol (VENTOLIN HFA) 108 (90 Base) MCG/ACT inhaler 4 puff (4 puffs Inhalation Given 11/21/19 1411)    ED Course  I have reviewed the triage vital signs and the nursing notes.  Pertinent labs & imaging results that were available during my care of the patient were reviewed by me and considered in my medical decision making (see chart for details).    MDM Rules/Calculators/A&P                     Patient presents the emergency department complaining of shortness of breath, small volume hemoptysis. Patient is ill appearing on evaluation with increased work of breathing and wheezing. Treating with albuterol without significant change. Chest x-ray with pulmonary edema versus infectious etiology. Patient care transferred pending additional labs, Final Clinical Impression(s) / ED Diagnoses Final diagnoses:  None    Rx / DC Orders ED Discharge Orders    None       Quintella Reichert, MD 11/21/19 1517

## 2019-11-22 DIAGNOSIS — J449 Chronic obstructive pulmonary disease, unspecified: Secondary | ICD-10-CM

## 2019-11-22 DIAGNOSIS — J9601 Acute respiratory failure with hypoxia: Secondary | ICD-10-CM | POA: Diagnosis present

## 2019-11-22 DIAGNOSIS — I5033 Acute on chronic diastolic (congestive) heart failure: Secondary | ICD-10-CM | POA: Diagnosis present

## 2019-11-22 DIAGNOSIS — J441 Chronic obstructive pulmonary disease with (acute) exacerbation: Secondary | ICD-10-CM | POA: Diagnosis present

## 2019-11-22 DIAGNOSIS — Z8673 Personal history of transient ischemic attack (TIA), and cerebral infarction without residual deficits: Secondary | ICD-10-CM | POA: Diagnosis not present

## 2019-11-22 DIAGNOSIS — Z79899 Other long term (current) drug therapy: Secondary | ICD-10-CM | POA: Diagnosis not present

## 2019-11-22 DIAGNOSIS — R0602 Shortness of breath: Secondary | ICD-10-CM | POA: Diagnosis present

## 2019-11-22 DIAGNOSIS — Z6841 Body Mass Index (BMI) 40.0 and over, adult: Secondary | ICD-10-CM | POA: Diagnosis not present

## 2019-11-22 DIAGNOSIS — K219 Gastro-esophageal reflux disease without esophagitis: Secondary | ICD-10-CM | POA: Diagnosis present

## 2019-11-22 DIAGNOSIS — H539 Unspecified visual disturbance: Secondary | ICD-10-CM | POA: Diagnosis present

## 2019-11-22 DIAGNOSIS — J45901 Unspecified asthma with (acute) exacerbation: Secondary | ICD-10-CM | POA: Diagnosis present

## 2019-11-22 DIAGNOSIS — J440 Chronic obstructive pulmonary disease with acute lower respiratory infection: Secondary | ICD-10-CM | POA: Diagnosis present

## 2019-11-22 DIAGNOSIS — I130 Hypertensive heart and chronic kidney disease with heart failure and stage 1 through stage 4 chronic kidney disease, or unspecified chronic kidney disease: Secondary | ICD-10-CM | POA: Diagnosis present

## 2019-11-22 DIAGNOSIS — J189 Pneumonia, unspecified organism: Secondary | ICD-10-CM | POA: Diagnosis present

## 2019-11-22 DIAGNOSIS — Z96641 Presence of right artificial hip joint: Secondary | ICD-10-CM | POA: Diagnosis present

## 2019-11-22 DIAGNOSIS — E1165 Type 2 diabetes mellitus with hyperglycemia: Secondary | ICD-10-CM | POA: Diagnosis present

## 2019-11-22 DIAGNOSIS — Z885 Allergy status to narcotic agent status: Secondary | ICD-10-CM | POA: Diagnosis not present

## 2019-11-22 DIAGNOSIS — N1832 Chronic kidney disease, stage 3b: Secondary | ICD-10-CM | POA: Diagnosis present

## 2019-11-22 DIAGNOSIS — Z7902 Long term (current) use of antithrombotics/antiplatelets: Secondary | ICD-10-CM | POA: Diagnosis not present

## 2019-11-22 DIAGNOSIS — M1990 Unspecified osteoarthritis, unspecified site: Secondary | ICD-10-CM | POA: Diagnosis present

## 2019-11-22 DIAGNOSIS — Z8249 Family history of ischemic heart disease and other diseases of the circulatory system: Secondary | ICD-10-CM | POA: Diagnosis not present

## 2019-11-22 DIAGNOSIS — E785 Hyperlipidemia, unspecified: Secondary | ICD-10-CM | POA: Diagnosis present

## 2019-11-22 DIAGNOSIS — Z20822 Contact with and (suspected) exposure to covid-19: Secondary | ICD-10-CM | POA: Diagnosis present

## 2019-11-22 DIAGNOSIS — G4733 Obstructive sleep apnea (adult) (pediatric): Secondary | ICD-10-CM | POA: Diagnosis present

## 2019-11-22 DIAGNOSIS — G4730 Sleep apnea, unspecified: Secondary | ICD-10-CM | POA: Diagnosis present

## 2019-11-22 HISTORY — DX: Acute respiratory failure with hypoxia: J96.01

## 2019-11-22 LAB — GLUCOSE, CAPILLARY
Glucose-Capillary: 207 mg/dL — ABNORMAL HIGH (ref 70–99)
Glucose-Capillary: 184 mg/dL — ABNORMAL HIGH (ref 70–99)
Glucose-Capillary: 239 mg/dL — ABNORMAL HIGH (ref 70–99)
Glucose-Capillary: 202 mg/dL — ABNORMAL HIGH (ref 70–99)

## 2019-11-22 LAB — PROCALCITONIN: Procalcitonin: <0.1 ng/mL

## 2019-11-22 LAB — BASIC METABOLIC PANEL
Anion gap: 12 (ref 5–15)
BUN: 29 mg/dL — ABNORMAL HIGH (ref 8–23)
CO2: 25 mmol/L (ref 22–32)
Calcium: 8.8 mg/dL — ABNORMAL LOW (ref 8.9–10.3)
Chloride: 100 mmol/L (ref 98–111)
Creatinine, Ser: 1.81 mg/dL — ABNORMAL HIGH (ref 0.44–1.00)
GFR calc Af Amer: 32 mL/min — ABNORMAL LOW (ref >60)
GFR calc non Af Amer: 28 mL/min — ABNORMAL LOW (ref >60)
Glucose, Bld: 270 mg/dL — ABNORMAL HIGH (ref 70–99)
Potassium: 4.3 mmol/L (ref 3.5–5.1)
Sodium: 137 mmol/L (ref 135–145)

## 2019-11-22 LAB — HEMOGLOBIN A1C
Hgb A1c MFr Bld: 7.1 % — ABNORMAL HIGH (ref 4.8–5.6)
Mean Plasma Glucose: 157.07 mg/dL

## 2019-11-22 LAB — CBC
HCT: 37.6 % (ref 36.0–46.0)
Hemoglobin: 12.2 g/dL (ref 12.0–15.0)
MCH: 29.9 pg (ref 26.0–34.0)
MCHC: 32.4 g/dL (ref 30.0–36.0)
MCV: 92.2 fL (ref 80.0–100.0)
Platelets: 339 10*3/uL (ref 150–400)
RBC: 4.08 MIL/uL (ref 3.87–5.11)
RDW: 15 % (ref 11.5–15.5)
WBC: 16.7 10*3/uL — ABNORMAL HIGH (ref 4.0–10.5)
nRBC: 0 % (ref 0.0–0.2)

## 2019-11-22 LAB — HIV ANTIBODY (ROUTINE TESTING W REFLEX): HIV Screen 4th Generation wRfx: NONREACTIVE

## 2019-11-22 MED ORDER — FUROSEMIDE 40 MG PO TABS
40.0000 mg | ORAL_TABLET | Freq: Two times a day (BID) | ORAL | Status: DC
  Administered 2019-11-22 – 2019-11-23 (×3): 40 mg via ORAL
  Filled 2019-11-22 (×3): qty 1

## 2019-11-22 MED ORDER — METOPROLOL SUCCINATE ER 25 MG PO TB24: 25.0000 mg | ORAL_TABLET | Freq: Every day | ORAL | Status: DC

## 2019-11-22 MED ORDER — INSULIN GLARGINE 100 UNIT/ML ~~LOC~~ SOLN
15.0000 [IU] | Freq: Every day | SUBCUTANEOUS | Status: DC
  Administered 2019-11-22 – 2019-11-24 (×3): 15 [IU] via SUBCUTANEOUS
  Filled 2019-11-22 (×3): qty 0.15

## 2019-11-22 MED ORDER — POTASSIUM CHLORIDE CRYS ER 10 MEQ PO TBCR
20.0000 meq | EXTENDED_RELEASE_TABLET | Freq: Every day | ORAL | Status: DC
  Administered 2019-11-22 – 2019-11-24 (×3): 20 meq via ORAL
  Filled 2019-11-22 (×6): qty 2

## 2019-11-22 MED ORDER — ATORVASTATIN CALCIUM 80 MG PO TABS: 80.0000 mg | ORAL_TABLET | Freq: Every day | ORAL | Status: DC

## 2019-11-22 MED ORDER — METOPROLOL TARTRATE 12.5 MG HALF TABLET: 12.5000 mg | ORAL_TABLET | Freq: Two times a day (BID) | ORAL | Status: DC

## 2019-11-22 MED ORDER — CLOPIDOGREL BISULFATE 75 MG PO TABS: 75.0000 mg | ORAL_TABLET | Freq: Every day | ORAL | Status: DC

## 2019-11-22 MED ORDER — IRBESARTAN 300 MG PO TABS
300.0000 mg | ORAL_TABLET | Freq: Every day | ORAL | Status: DC
  Administered 2019-11-22 – 2019-11-23 (×2): 300 mg via ORAL
  Filled 2019-11-22 (×2): qty 1

## 2019-11-22 MED ORDER — SODIUM CHLORIDE 0.9 % IV SOLN
INTRAVENOUS | Status: DC | PRN
  Administered 2019-11-22 – 2019-11-23 (×3): 500 mL via INTRAVENOUS

## 2019-11-22 NOTE — Evaluation (Signed)
Occupational Therapy Evaluation Patient Details Name: Sherry Fitzpatrick MRN: [id number] DOB: 1951/02/02 Today's Date: 11/22/2019    History of Present Illness 69 y.o. female with history of asthma, diastolic CHF, CKD-3, CVA without residual deficit, HTN, morbid obesity, osteoarthritis and remote tobacco use presenting with progressive shortness of breath.   Clinical Impression   PTA independent. Patient admitted for above and limited by problem list below, including decreased activity tolerance and decreased cardiopulmonary status. Currently requires supervision for ADLs, transfers and mobility in room.  On 3L [place] and SpO2 maintained.  Initiated education on energy conservation techniques.  DOE 3/4 with minimal activity, even SOB with talking to therapist at times. Patient will benefit from further OT services acutely to maximize independence, activity tolerance, and use of energy conservation strategies, but anticipate no further needs after dc home.     Follow Up Recommendations  No OT follow up    Equipment Recommendations  None recommended by OT    Recommendations for Other Services       Precautions / Restrictions Precautions Precautions: Other (comment);Fall Precaution Comments: watch sats Restrictions Weight Bearing Restrictions: No      Mobility Bed Mobility Overal bed mobility: Modified Independent             General bed mobility comments: OOB in recliner upon entry  Transfers Overall transfer level: Needs assistance Equipment used: None Transfers: Sit to/from Stand Sit to Stand: Supervision Stand pivot transfers: Supervision       General transfer comment: for safety, lines    Balance Overall balance assessment: No apparent balance deficits (not formally assessed)                                         ADL either performed or assessed with clinical judgement   ADL Overall ADL's : Needs assistance/impaired     Grooming:  Supervision/safety;Standing   Upper Body Bathing: Supervision/ safety;Sitting   Lower Body Bathing: Supervison/ safety;Sit to/from stand   Upper Body Dressing : Supervision/safety;Sitting   Lower Body Dressing: Supervision/safety;Sit to/from stand   Toilet Transfer: Supervision/safety;Ambulation   Toileting- Clothing Manipulation and Hygiene: Supervision/safety;Set up;Sit to/from stand       Functional mobility during ADLs: Supervision/safety General ADL Comments: patinet limited by SOB and decreased activity tolerance, initated education on energy conservation techniques      Vision   Vision Assessment?: No apparent visual deficits     Perception     Praxis      Pertinent Vitals/Pain Pain Assessment: No/denies pain     Hand Dominance Right   Extremity/Trunk Assessment Upper Extremity Assessment Upper Extremity Assessment: Overall WFL for tasks assessed   Lower Extremity Assessment Lower Extremity Assessment: Defer to PT evaluation   Cervical / Trunk Assessment Cervical / Trunk Assessment: Normal   Communication Communication Communication: No difficulties   Cognition Arousal/Alertness: Awake/alert Behavior During Therapy: WFL for tasks assessed/performed Overall Cognitive Status: Within Functional Limits for tasks assessed                                     General Comments  Pt on 3L , SpO2 maintained during session; DOE 3/4 with minimal activity     Exercises     Shoulder Instructions      Home Living Family/patient expects to be discharged to:: Private residence Living  Arrangements: Children Available Help at Discharge: Family;Available 24 hours/day Type of Home: House Home Access: Level entry     Home Layout: Multi-level Alternate Level Stairs-Number of Steps: 2 Alternate Level Stairs-Rails: None Bathroom Shower/Tub: Teacher, early years/pre: Standard Bathroom Accessibility: Yes   Home Equipment: Walker - 2  wheels;Bedside commode;Cane - single point;Tub bench          Prior Functioning/Environment Level of Independence: Independent        Comments: daughter does most housework and heavy meal prep        OT Problem List: Decreased activity tolerance;Cardiopulmonary status limiting activity;Decreased knowledge of use of DME or AE      OT Treatment/Interventions: Self-care/ADL training;Energy conservation;DME and/or AE instruction;Therapeutic activities;Patient/family education    OT Goals(Current goals can be found in the care plan section) Acute Rehab OT Goals Patient Stated Goal: home OT Goal Formulation: With patient Time For Goal Achievement: 12/06/19 Potential to Achieve Goals: Good  OT Frequency: Min 2X/week   Barriers to D/C:            Co-evaluation              AM-PAC OT "6 Clicks" Daily Activity     Outcome Measure Help from another person eating meals?: None Help from another person taking care of personal grooming?: A Little Help from another person toileting, which includes using toliet, bedpan, or urinal?: A Little Help from another person bathing (including washing, rinsing, drying)?: A Little Help from another person to put on and taking off regular upper body clothing?: A Little Help from another person to put on and taking off regular lower body clothing?: A Little 6 Click Score: 19   End of Session Equipment Utilized During Treatment: Oxygen(3L) Nurse Communication: Mobility status  Activity Tolerance: Patient tolerated treatment well Patient left: in chair;with call bell/phone within reach;with nursing/sitter in room  OT Visit Diagnosis: Other (comment)(decrased activity tolerance)                Time: 1000-1031 OT Time Calculation (min): 31 min Charges:  OT General Charges $OT Visit: 1 Visit OT Evaluation $OT Eval Low Complexity: 1 Low OT Treatments $Self Care/Home Management : 8-22 mins  Jolaine Artist, OT Acute Rehabilitation  Services Pager [phone] Office [phone]   Delight Stare 11/22/2019, 11:18 AM

## 2019-11-22 NOTE — Evaluation (Signed)
Physical Therapy Evaluation Patient Details Name: Sherry Fitzpatrick MRN: [id number] DOB: 1950-12-17 Today's Date: 11/22/2019   History of Present Illness  69 y.o. female with history of asthma, diastolic CHF, CKD-3, CVA without residual deficit, HTN, morbid obesity, osteoarthritis and remote tobacco use presenting with progressive shortness of breath.    Clinical Impression  Pt admitted with above diagnosis. PTA pt lived with daughter and son in law, independent with mobility and ADLs. No home O2 at baseline. On eval, pt required supervision transfers and ambulation 40' without AD. Mobilized on 3L with SpO2 98%. 3/4 DOE noted with minimal activity.  Pt currently with functional limitations due to the deficits listed below (see PT Problem List). Pt will benefit from skilled PT to increase their independence and safety with mobility to allow discharge to the venue listed below.       Follow Up Recommendations No PT follow up;Supervision - Intermittent    Equipment Recommendations  None recommended by PT    Recommendations for Other Services       Precautions / Restrictions Precautions Precautions: Other (comment);Fall Precaution Comments: watch sats Restrictions Weight Bearing Restrictions: No      Mobility  Bed Mobility Overal bed mobility: Modified Independent                Transfers Overall transfer level: Needs assistance Equipment used: None Transfers: Sit to/from Stand;Stand Pivot Transfers Sit to Stand: Supervision Stand pivot transfers: Supervision       General transfer comment: supervision for safety/lines, Pt with c/o dizziness.  Ambulation/Gait Ambulation/Gait assistance: Supervision Gait Distance (Feet): 40 Feet Assistive device: None Gait Pattern/deviations: Step-through pattern;Decreased stride length;Wide base of support Gait velocity: decreased Gait velocity interpretation: <1.31 ft/sec, indicative of household ambulator General Gait Details: wide  BOS due to body habitus. Amb on 3L with 3/4 DOE. SpO2 98%.  Stairs            Wheelchair Mobility    Modified Rankin (Stroke Patients Only)       Balance Overall balance assessment: No apparent balance deficits (not formally assessed)                                           Pertinent Vitals/Pain Pain Assessment: No/denies pain    Home Living Family/patient expects to be discharged to:: Private residence Living Arrangements: Children Available Help at Discharge: Family;Available 24 hours/day Type of Home: House Home Access: Level entry     Home Layout: Multi-level Home Equipment: Walker - 2 wheels;Bedside commode;Cane - single point      Prior Function Level of Independence: Independent         Comments: daughter does most housework and heavy meal prep     Hand Dominance   Dominant Hand: Right    Extremity/Trunk Assessment   Upper Extremity Assessment Upper Extremity Assessment: Defer to OT evaluation    Lower Extremity Assessment Lower Extremity Assessment: Overall WFL for tasks assessed    Cervical / Trunk Assessment Cervical / Trunk Assessment: Normal  Communication   Communication: No difficulties  Cognition Arousal/Alertness: Awake/alert Behavior During Therapy: WFL for tasks assessed/performed Overall Cognitive Status: Within Functional Limits for tasks assessed  General Comments General comments (skin integrity, edema, etc.): Pt on 3L [place]. SpO2 98% at rest and during mobility. SOB noted as rest as well as during mobility.    Exercises     Assessment/Plan    PT Assessment Patient needs continued PT services  PT Problem List Decreased mobility;Cardiopulmonary status limiting activity;Decreased activity tolerance       PT Treatment Interventions Therapeutic activities;Gait training;Therapeutic exercise;Patient/family education;Stair training;Functional mobility  training    PT Goals (Current goals can be found in the Care Plan section)  Acute Rehab PT Goals Patient Stated Goal: home PT Goal Formulation: With patient Time For Goal Achievement: 12/06/19 Potential to Achieve Goals: Good    Frequency Min 3X/week   Barriers to discharge        Co-evaluation               AM-PAC PT "6 Clicks" Mobility  Outcome Measure Help needed turning from your back to your side while in a flat bed without using bedrails?: None Help needed moving from lying on your back to sitting on the side of a flat bed without using bedrails?: None Help needed moving to and from a bed to a chair (including a wheelchair)?: A Little Help needed standing up from a chair using your arms (e.g., wheelchair or bedside chair)?: None Help needed to walk in hospital room?: A Little Help needed climbing 3-5 steps with a railing? : A Little 6 Click Score: 21    End of Session Equipment Utilized During Treatment: Oxygen Activity Tolerance: Treatment limited secondary to medical complications (Comment)(DOE) Patient left: in chair;with call bell/phone within reach Nurse Communication: Mobility status PT Visit Diagnosis: Difficulty in walking, not elsewhere classified (R26.2)    Time: 3154-0086 PT Time Calculation (min) (ACUTE ONLY): 17 min   Charges:   PT Evaluation $PT Eval Moderate Complexity: 1 Mod          Lorrin Goodell, PT  Office # [phone] Pager [phone]   [name] 11/22/2019, 9:37 AM

## 2019-11-22 NOTE — Progress Notes (Addendum)
PROGRESS NOTE    Sherry Fitzpatrick  KJZ:[id number] DOB: 12-Dec-1950 DOA: 11/21/2019 PCP: Patient, No Pcp Per   Brief Narrative:  HPI: Sherry Fitzpatrick is a 69 y.o. female with history of asthma, diastolic CHF, CKD-3, CVA without residual deficit, HTN, morbid obesity, osteoarthritis and remote tobacco use presenting with progressive shortness of breath.  Patient started experiencing shortness of breath 4 days ago that has gradually gotten worse.  Dyspnea worse with exertion.  She says it is hard to get up to walk but better once she started walking.  She has been out of his inhalers for about a week.  She also reports productive cough with "pinkish phlegm".  She has associated chest tightness.  Chest tightness is not necessarily exertional.  She denies radiation to her back, jaw, shoulder or arm.  Reports fever to 102 about 3 days ago.  She has not had further fever.  Reports some runny nose but denies sore throat.  She also noted some swelling in her feet.  She denies orthopnea or PND.  She used to see a Lasix every other day.  She denies calf tenderness.  She denies immobility, recent travel or history of blood clot.  She denies known Covid exposure or sick contacts.  Denies nausea, vomiting, abdominal pain or UTI symptoms.  Denies focal neuro symptoms.  Per EMS report to EDP, "respiratory distress on arrival with poor air movement.  Treated with NRB, Solu-Medrol, magnesium and epinephrine".  No documented or reported desaturation.   Lives with a daughter.  Quit smoking about 30 years ago.  Admits to occasional alcohol.  Denies recreational drug use.  She wishes to be full code.  In ED, stable.  Respiratory rate about 22.  Weaned from NRB to 4 L by [place].  Saturation in the range of 97 to 100%.  CBC and CMP without significant finding.  Troponin and BNP within normal range.  Initial EKG without acute ischemic finding or abnormal interval.  COVID-19 and influenza PCR negative.  ABG with compensated  respiratory acidosis.  D-dimer 2.05.  Portable CXR with hazy pulmonary opacities, mainly over hilar areas worse compared to previous CXR. Started on breathing treatments and hospitalist service was called for admission due to ongoing wheezing and respiratory distress.  Requested EDP to obtain CTA chest given elevated D-dimer.  CTA could also clarify CXR finding.  Patient has contrast allergy-and will be premedicated  Assessment & Plan:   Active Problems:   Shortness of breath   Acute hypoxic respiratory failure secondary to acute COPD/asthma exacerbation: Feels much better but is still she is a stopping several times while talking in order to take her breathe.  Still has diffuse wheezing bilaterally.  Now on 3 L nasal can oxygen.  Continue Solu-Medrol and doxycycline.  Try to wean her oxygen.  Chronic diastolic CHF: Stable at baseline and euvolemic.  Received IV Lasix in the emergency department yesterday.  Chest x-ray showed some pulmonary edema but CT angiogram of the chest did not show that.  She received 1 dose of IV Lasix yesterday.  We will resume her home dose of Lasix which is 40 mg p.o. twice daily.  Chest tightness/pain: Likely secondary to her COPD exacerbation.  CT angiogram of the chest negative for PE.  Type 2 diabetes mellitus with hyperglycemia:  AKI on CKD stage IIIb: Slight jump in creatinine from her baseline of 1.2-1.8 today.  Repeat labs tomorrow morning.  Essential hypertension: Fairly controlled.  Resume home medications.  Obstructive sleep  apnea: Continue CPAP.  History of other nonhemorrhagic CVA without residual deficit/hyperlipidemia: Continue atorvastatin and Plavix.  ?  Community-acquired pneumonia: Chest x-ray and CT chest shows developing airspace opacity in medial right lower lobe.  Procalcitonin unremarkable.  She is afebrile.  Did not have leukocytosis yesterday but developed today.  She is on doxycycline.  We will continue that and monitor as the evidence  of pneumonia is not a strong yet.  Consider switching to broad-spectrum if more evidence appears.  DVT prophylaxis: Lovenox Code Status: Full code Family Communication:  None present at bedside.  Plan of care discussed with patient in length and he verbalized understanding and agreed with it. Patient is from: Home Disposition Plan: Home potentially tomorrow Barriers to discharge: Clinical improvement   Estimated body mass index is 43.62 kg/m as calculated from the following:   Height as of this encounter: 5\' 4"  (1.626 m).   Weight as of this encounter: 115.3 kg.      Nutritional status:               Consultants:   None  Procedures:   None  Antimicrobials:   Doxycycline   Subjective: Seen and examined.  Although she states that she feels better but she visually seems to be struggling with even talking.  She is stopping several times while talking in order to catch a breath.  Objective: Vitals:   11/22/19 0329 11/22/19 0336 11/22/19 0734 11/22/19 0737  BP:  (!) 153/77  (!) 151/85  Pulse:  73 76 72  Resp:   18 18  Temp:  98.2 F (36.8 C)  97.8 F (36.6 C)  TempSrc:  Oral  Oral  SpO2: 100% 94% 98% 100%  Weight: 115.3 kg     Height:        Intake/Output Summary (Last 24 hours) at 11/22/2019 1046 Last data filed at 11/22/2019 1000 Gross per 24 hour  Intake 720 ml  Output 650 ml  Net 70 ml   Filed Weights   11/21/19 1400 11/21/19 1843 11/22/19 0329  Weight: 114.3 kg 114.6 kg 115.3 kg    Examination:  General exam: Appears calm and comfortable  Respiratory system: Tachypnea with diffuse expiratory wheezes Cardiovascular system: S1 & S2 heard, RRR. No JVD, murmurs, rubs, gallops or clicks.  Trace pitting edema bilateral lower extremity. Gastrointestinal system: Abdomen is nondistended, soft and nontender. No organomegaly or masses felt. Normal bowel sounds heard. Central nervous system: Alert and oriented. No focal neurological  deficits. Extremities: Symmetric 5 x 5 power. Skin: No rashes, lesions or ulcers Psychiatry: Judgement and insight appear normal. Mood & affect appropriate.    Data Reviewed: I have personally reviewed following labs and imaging studies  CBC: Recent Labs  Lab 11/21/19 1432 11/21/19 1555 11/22/19 0945  WBC 7.1  --  16.7*  NEUTROABS 3.0  --   --   HGB 12.0 11.2* 12.2  HCT 36.6 33.0* 37.6  MCV 89.9  --  92.2  PLT 322  --  400   Basic Metabolic Panel: Recent Labs  Lab 11/21/19 1432 11/21/19 1555 11/22/19 0945  NA 139 140 137  K 3.5 3.8 4.3  CL 101  --  100  CO2 27  --  25  GLUCOSE 169*  --  270*  BUN 12  --  29*  CREATININE 1.20*  --  1.81*  CALCIUM 8.7*  --  8.8*   GFR: Estimated Creatinine Clearance: 36 mL/min (A) (by C-G formula based on SCr of 1.81 mg/dL (  H)). Liver Function Tests: Recent Labs  Lab 11/21/19 1432  AST 21  ALT 19  ALKPHOS 75  BILITOT 0.4  PROT 7.1  ALBUMIN 3.3*   No results for input(s): LIPASE, AMYLASE in the last 168 hours. No results for input(s): AMMONIA in the last 168 hours. Coagulation Profile: No results for input(s): INR, PROTIME in the last 168 hours. Cardiac Enzymes: No results for input(s): CKTOTAL, CKMB, CKMBINDEX, TROPONINI in the last 168 hours. BNP (last 3 results) No results for input(s): PROBNP in the last 8760 hours. HbA1C: Recent Labs    11/22/19 0624  HGBA1C 7.1*   CBG: Recent Labs  Lab 11/21/19 2154 11/22/19 0548  GLUCAP 272* 207*   Lipid Profile: No results for input(s): CHOL, HDL, LDLCALC, TRIG, CHOLHDL, LDLDIRECT in the last 72 hours. Thyroid Function Tests: No results for input(s): TSH, T4TOTAL, FREET4, T3FREE, THYROIDAB in the last 72 hours. Anemia Panel: No results for input(s): VITAMINB12, FOLATE, FERRITIN, TIBC, IRON, RETICCTPCT in the last 72 hours. Sepsis Labs: Recent Labs  Lab 11/21/19 1625 11/22/19 0624  PROCALCITON <0.10 <0.10    Recent Results (from the past 240 hour(s))   Respiratory Panel by RT PCR (Flu A&B, Covid) - Nasopharyngeal Swab     Status: None   Collection Time: 11/21/19  1:52 PM   Specimen: Nasopharyngeal Swab  Result Value Ref Range Status   SARS Coronavirus 2 by RT PCR NEGATIVE NEGATIVE Final    Comment: (NOTE) SARS-CoV-2 target nucleic acids are NOT DETECTED. The SARS-CoV-2 RNA is generally detectable in upper respiratoy specimens during the acute phase of infection. The lowest concentration of SARS-CoV-2 viral copies this assay can detect is 131 copies/mL. A negative result does not preclude SARS-Cov-2 infection and should not be used as the sole basis for treatment or other patient management decisions. A negative result may occur with  improper specimen collection/handling, submission of specimen other than nasopharyngeal swab, presence of viral mutation(s) within the areas targeted by this assay, and inadequate number of viral copies (<131 copies/mL). A negative result must be combined with clinical observations, patient history, and epidemiological information. The expected result is Negative. Fact Sheet for Patients:  PinkCheek.be Fact Sheet for Healthcare Providers:  GravelBags.it This test is not yet ap proved or cleared by the [place] FDA and  has been authorized for detection and/or diagnosis of SARS-CoV-2 by FDA under an Emergency Use Authorization (EUA). This EUA will remain  in effect (meaning this test can be used) for the duration of the COVID-19 declaration under Section 564(b)(1) of the Act, 21 U.S.C. section 360bbb-3(b)(1), unless the authorization is terminated or revoked sooner.    Influenza A by PCR NEGATIVE NEGATIVE Final   Influenza B by PCR NEGATIVE NEGATIVE Final    Comment: (NOTE) The Xpert Xpress SARS-CoV-2/FLU/RSV assay is intended as an aid in  the diagnosis of influenza from Nasopharyngeal swab specimens and  should not be used as a sole  basis for treatment. Nasal washings and  aspirates are unacceptable for Xpert Xpress SARS-CoV-2/FLU/RSV  testing. Fact Sheet for Patients: PinkCheek.be Fact Sheet for Healthcare Providers: GravelBags.it This test is not yet approved or cleared by the [place] FDA and  has been authorized for detection and/or diagnosis of SARS-CoV-2 by  FDA under an Emergency Use Authorization (EUA). This EUA will remain  in effect (meaning this test can be used) for the duration of the  Covid-19 declaration under Section 564(b)(1) of the Act, 21  U.S.C. section 360bbb-3(b)(1), unless the authorization is  terminated or revoked. Performed at [facility], [place] [address], [place], [place] [address]       Radiology Studies: CT Angio Chest PE W and/or Wo Contrast  Result Date: 11/21/2019 CLINICAL DATA:  Shortness of breath. EXAM: CT ANGIOGRAPHY CHEST WITH CONTRAST TECHNIQUE: Multidetector CT imaging of the chest was performed using the standard protocol during bolus administration of intravenous contrast. Multiplanar CT image reconstructions and MIPs were obtained to evaluate the vascular anatomy. CONTRAST:  146mL OMNIPAQUE IOHEXOL 350 MG/ML SOLN COMPARISON:  07/13/2019 FINDINGS: Cardiovascular: Evaluation for pulmonary emboli is limited by respiratory motion artifact, suboptimal contrast timing, and patient body habitus. Given these significant limitations, no acute pulmonary embolism was detected.The heart size is enlarged. Aortic calcifications are noted without evidence for dissection or aneurysm. The main pulmonary artery is not significantly dilated. Mild coronary artery calcifications are noted. Mediastinum/Nodes: --No mediastinal or hilar lymphadenopathy. --No axillary lymphadenopathy. --No supraclavicular lymphadenopathy. --there are bilateral thyroid nodules measuring up to approximately 1.4 cm on the right. No further follow-up is  required. --The esophagus is unremarkable Lungs/Pleura: There is a mosaic appearance of the lung parenchyma bilaterally. There is no pneumothorax. No large pleural effusion. There is some bronchial wall thickening and mucus plugging at the lung bases. There is a developing airspace opacity in the medial right lower lobe (axial series 6, image 83). There is a relatively stable 8 mm pulmonary nodule in the lingula (axial series 6, image 51). Upper Abdomen: No acute abnormality. Musculoskeletal: No chest wall abnormality. No acute or significant osseous findings. Review of the MIP images confirms the above findings. IMPRESSION: 1. Evaluation for pulmonary emboli is severely limited as detailed above. Given these limitations, no acute pulmonary embolism was detected. 2. Developing airspace opacity in the medial right lower lobe is concerning for pneumonia. 3. There is an 8 mm pulmonary nodule in the lingula as detailed above. This is stable since prior study dated 07/13/2019. This represents nearly 6 months of stability. As such a follow-up CT of the chest is recommended in 12-18 months to document a full 18-24 months of stability. 4. Mosaic appearance of the lung parenchyma bilaterally. This is nonspecific and may be secondary to small airway disease or other causes such as pulmonary hypertension. 5. Cardiomegaly Aortic Atherosclerosis (ICD10-I70.0). Electronically Signed   By: Constance Holster M.D.   On: 11/21/2019 22:35   DG Chest Port 1 View  Result Date: 11/21/2019 CLINICAL DATA:  Shortness of breath and cough for 4 days. EXAM: PORTABLE CHEST 1 VIEW COMPARISON:  Single-view of the chest 10/29/2019. CT chest 07/13/2019. FINDINGS: There is cardiomegaly and atherosclerosis. Diffuse hazy pulmonary opacities are present bilaterally. No pneumothorax or pleural effusion. IMPRESSION: Hazy pulmonary opacities could be due to edema or pneumonia. Cardiomegaly. Atherosclerosis. Electronically Signed   By: Inge Rise  M.D.   On: 11/21/2019 14:41    Scheduled Meds: . amLODipine  5 mg Oral Daily  . atorvastatin  20 mg Oral q1800  . cloNIDine  0.3 mg Oral BID  . clopidogrel  75 mg Oral Daily  . insulin aspart  0-15 Units Subcutaneous TID WC  . insulin aspart  0-5 Units Subcutaneous QHS  . insulin glargine  15 Units Subcutaneous Daily  . ipratropium-albuterol  3 mL Nebulization Q6H  . methylPREDNISolone (SOLU-MEDROL) injection  80 mg Intravenous Q8H  . metoprolol tartrate  12.5 mg Oral BID  . mometasone-formoterol  1 puff Inhalation BID   Continuous Infusions: . sodium chloride 500 mL (11/22/19 0534)  .  doxycycline (VIBRAMYCIN) IV 100 mg (11/22/19 0537)     LOS: 0 days   Time spent: 35 minutes   Darliss Cheney, MD [facility]  11/22/2019, 10:46 AM   To contact the attending provider between 7A-7P or the covering provider during after hours 7P-7A, please log into the web site [email or web address].

## 2019-11-23 LAB — GLUCOSE, CAPILLARY
Glucose-Capillary: 188 mg/dL — ABNORMAL HIGH (ref 70–99)
Glucose-Capillary: 211 mg/dL — ABNORMAL HIGH (ref 70–99)
Glucose-Capillary: 286 mg/dL — ABNORMAL HIGH (ref 70–99)
Glucose-Capillary: 223 mg/dL — ABNORMAL HIGH (ref 70–99)

## 2019-11-23 MED ORDER — GUAIFENESIN ER 600 MG PO TB12
1200.0000 mg | ORAL_TABLET | Freq: Two times a day (BID) | ORAL | Status: DC
  Administered 2019-11-23 – 2019-11-24 (×3): 1200 mg via ORAL
  Filled 2019-11-23 (×3): qty 2

## 2019-11-23 MED ORDER — METOPROLOL TARTRATE 25 MG PO TABS
25.0000 mg | ORAL_TABLET | Freq: Two times a day (BID) | ORAL | Status: DC
  Administered 2019-11-23 – 2019-11-24 (×3): 25 mg via ORAL
  Filled 2019-11-23 (×3): qty 1

## 2019-11-23 MED ORDER — IPRATROPIUM-ALBUTEROL 0.5-2.5 (3) MG/3ML IN SOLN: 3.0000 mL | RESPIRATORY_TRACT | Status: DC | PRN

## 2019-11-23 MED ORDER — LEVOFLOXACIN IN D5W 750 MG/150ML IV SOLN
750.0000 mg | INTRAVENOUS | Status: DC
  Administered 2019-11-23: 750 mg via INTRAVENOUS
  Filled 2019-11-23: qty 150

## 2019-11-23 MED ORDER — IPRATROPIUM-ALBUTEROL 0.5-2.5 (3) MG/3ML IN SOLN
3.0000 mL | Freq: Four times a day (QID) | RESPIRATORY_TRACT | Status: DC | PRN
  Administered 2019-11-23: 3 mL via RESPIRATORY_TRACT
  Filled 2019-11-23: qty 3

## 2019-11-23 MED ORDER — IPRATROPIUM-ALBUTEROL 0.5-2.5 (3) MG/3ML IN SOLN
3.0000 mL | Freq: Four times a day (QID) | RESPIRATORY_TRACT | Status: DC
  Administered 2019-11-23 (×2): 3 mL via RESPIRATORY_TRACT
  Filled 2019-11-23 (×2): qty 3

## 2019-11-23 NOTE — Progress Notes (Signed)
Occupational Therapy Treatment Patient Details Name: MORGEN LINEBAUGH MRN: [id number] DOB: 07-25-1951 Today's Date: 11/23/2019    History of present illness 69 y.o. female with history of asthma, diastolic CHF, CKD-3, CVA without residual deficit, HTN, morbid obesity, osteoarthritis and remote tobacco use presenting with progressive shortness of breath.   OT comments  Patient continuing to make steady progress towards goals during skilled OT session. Pt's session consisted of energy conservation handout education, and ADL's at the sink at set up level. Patient willing to be educated on energy conservation techniques, and is already adhering to taking frequent rest breaks when completing IADL's. Pt required set up for ADL task of washing face and brushing of teeth, requiring no rest breaks, but minimal cues to check in with her respirations in order to ensure good saturation rates. Pt then able to ambulate functional household distance to transfer to chair with SBA. Pt will continue to benefit from therapy in hospital to re-enforce education and promote safe ADL practices. Will continue to follow acutely.    Follow Up Recommendations  No OT follow up    Equipment Recommendations  None recommended by OT    Recommendations for Other Services      Precautions / Restrictions Precautions Precautions: Fall Restrictions Weight Bearing Restrictions: No       Mobility Bed Mobility Overal bed mobility: Modified Independent                Transfers Overall transfer level: Modified independent   Transfers: Sit to/from Stand Sit to Stand: Supervision         General transfer comment: Safety    Balance                                           ADL either performed or assessed with clinical judgement   ADL Overall ADL's : Needs assistance/impaired     Grooming: Supervision/safety;Standing   Upper Body Bathing: Supervision/ safety;Sitting            Lower Body Dressing: Supervision/safety;Sit to/from stand   Toilet Transfer: Supervision/safety;Ambulation Toilet Transfer Details (indicate cue type and reason): Simulated via chair transfer         Functional mobility during ADLs: Supervision/safety;Rolling walker General ADL Comments: Therapist educated patient on energy conservation techniques and provided a handout for future reference.     Vision       Perception     Praxis      Cognition Arousal/Alertness: Awake/alert Behavior During Therapy: WFL for tasks assessed/performed Overall Cognitive Status: Within Functional Limits for tasks assessed                                          Exercises     Shoulder Instructions       General Comments      Pertinent Vitals/ Pain       Pain Assessment: No/denies pain  Home Living                                          Prior Functioning/Environment              Frequency  Min 2X/week  Progress Toward Goals  OT Goals(current goals can now be found in the care plan section)  Progress towards OT goals: Progressing toward goals  Acute Rehab OT Goals Patient Stated Goal: home OT Goal Formulation: With patient Time For Goal Achievement: 12/06/19 Potential to Achieve Goals: Good  Plan Discharge plan remains appropriate    Co-evaluation                 AM-PAC OT "6 Clicks" Daily Activity     Outcome Measure   Help from another person eating meals?: None Help from another person taking care of personal grooming?: A Little Help from another person toileting, which includes using toliet, bedpan, or urinal?: A Little Help from another person bathing (including washing, rinsing, drying)?: A Little Help from another person to put on and taking off regular upper body clothing?: A Little Help from another person to put on and taking off regular lower body clothing?: A Little 6 Click Score: 19    End of  Session Equipment Utilized During Treatment: Oxygen  OT Visit Diagnosis: Other (comment)   Activity Tolerance Patient tolerated treatment well   Patient Left in chair;with call bell/phone within reach   Nurse Communication Mobility status(Pure Elza Rafter)        Time: 9323-5573 OT Time Calculation (min): 28 min  Charges: OT General Charges $OT Visit: 1 Visit OT Treatments $Self Care/Home Management : 23-37 mins  Corinne Ports E. Miles Borkowski COTAL/L [place] 11/23/2019, 12:39 PM

## 2019-11-23 NOTE — Progress Notes (Addendum)
PROGRESS NOTE    NYRIAH Fitzpatrick  OTL:[id number] DOB: [birth year] DOA: 11/21/2019 PCP: Patient, No Pcp Per   Brief Narrative:  Patient is a 69 year old female with history of asthma, diastolic CHF, CKD stage IIIb, CVA without residual deficit, hypertension, moderate obesity, osteoarthritis, remote tobacco abuse presented with shortness of breath.  She was found to be wheezing on presentation.  She was admitted for the management of asthma/COPD exacerbation.  Chest CT also showed stable possible right lower lobe pneumonia.  Currently she is requiring supplemental oxygen for maintenance of saturation.  Also started on antibiotics for pneumonia.  Assessment & Plan:   Active Problems:   Shortness of breath   Acute respiratory failure with hypoxia (HCC)   COPD with acute exacerbation (HCC)   Acute asthma exacerbation    Acute hypoxic respiratory failure: Secondary to COPD/exacerbation.  Started on steroids, bronchodilators.  Currently she is on 2 L of oxygen per minute.  She was still wheezing today. She is not on home oxygen.  Will check if she needs oxygen on discharge.  Asthma/COPD exacerbation: Past smoker.  Does not have diagnosis of COPD yet.  Takes inhalers at home.  Continue bronchodilators, steroids.  Continue mucolytics.  Right lower lobe pneumonia: No leukocytosis or fever.  Antibiotics will be changed to Levaquin.  History of chronic diastolic CHF: Currently euvolemic.  She received IV Lasix.  IV Lasix will be stopped and she will put  on oral Lasix from home.  She is to follow-up with Dr. Terrence Dupont in the past.  AKI CKD stage IIIb: Baseline creatinine of 1.2-1.4.  Currently kidney function up from baseline.  Check BMP tomorrow.  Will hold Lasix.  Hypertension: Currently blood pressure stable.  Continue current medicines  OSA: Continue CPAP at night  Diabetes type 2 with hyperglycemia: Continue current insulin regimen.  Monitor CBGs  History of nonhemorrhagic CVA: Continue  Lipitor, Plavix         DVT prophylaxis:Lovenox Code Status: Full Family Communication: Called daughter on phone Disposition Plan: Likely home tomorrow   Consultants: None  Procedures:None  Antimicrobials:  Anti-infectives (From admission, onward)   Start     Dose/Rate Route Frequency Ordered Stop   11/21/19 1730  doxycycline (VIBRAMYCIN) 100 mg in sodium chloride 0.9 % 250 mL IVPB  Status:  Discontinued     100 mg 125 mL/hr over 120 Minutes Intravenous Every 12 hours 11/21/19 1719 11/23/19 0935      Subjective: Patient seen and examined at the bedside this morning.  Hemodynamically stable but still coughing, bringing up sputum.  She was wheezing during my evaluation.  Currently on 2 L of oxygen per minute Objective: Vitals:   11/23/19 0500 11/23/19 0552 11/23/19 0757 11/23/19 0822  BP:  (!) 154/79 (!) 155/75   Pulse:  (!) 57 63 62  Resp:  17 18 16   Temp:  97.8 F (36.6 C) 98 F (36.7 C)   TempSrc:  Oral Oral   SpO2:  97% 98% 98%  Weight: 116.8 kg     Height:        Intake/Output Summary (Last 24 hours) at 11/23/2019 0936 Last data filed at 11/23/2019 0900 Gross per 24 hour  Intake 1711.55 ml  Output 600 ml  Net 1111.55 ml   Filed Weights   11/21/19 1843 11/22/19 0329 11/23/19 0500  Weight: 114.6 kg 115.3 kg 116.8 kg    Examination:  General exam: Morbidly obese, coughing HEENT:PERRL,Oral mucosa moist, Ear/Nose normal on gross exam Respiratory system: Bilateral decreased air  entry, expiratory wheezes Cardiovascular system: S1 & S2 heard, RRR. No JVD, murmurs, rubs, gallops or clicks. No pedal edema. Gastrointestinal system: Abdomen is nondistended, soft and nontender. No organomegaly or masses felt. Normal bowel sounds heard. Central nervous system: Alert and oriented. No focal neurological deficits. Extremities: No edema, no clubbing ,no cyanosis Skin: No rashes, lesions or ulcers,no icterus ,no pallor   Data Reviewed: I have personally reviewed  following labs and imaging studies  CBC: Recent Labs  Lab 11/21/19 1432 11/21/19 1555 11/22/19 0945  WBC 7.1  --  16.7*  NEUTROABS 3.0  --   --   HGB 12.0 11.2* 12.2  HCT 36.6 33.0* 37.6  MCV 89.9  --  92.2  PLT 322  --  673   Basic Metabolic Panel: Recent Labs  Lab 11/21/19 1432 11/21/19 1555 11/22/19 0945  NA 139 140 137  K 3.5 3.8 4.3  CL 101  --  100  CO2 27  --  25  GLUCOSE 169*  --  270*  BUN 12  --  29*  CREATININE 1.20*  --  1.81*  CALCIUM 8.7*  --  8.8*   GFR: Estimated Creatinine Clearance: 36.3 mL/min (A) (by C-G formula based on SCr of 1.81 mg/dL (H)). Liver Function Tests: Recent Labs  Lab 11/21/19 1432  AST 21  ALT 19  ALKPHOS 75  BILITOT 0.4  PROT 7.1  ALBUMIN 3.3*   No results for input(s): LIPASE, AMYLASE in the last 168 hours. No results for input(s): AMMONIA in the last 168 hours. Coagulation Profile: No results for input(s): INR, PROTIME in the last 168 hours. Cardiac Enzymes: No results for input(s): CKTOTAL, CKMB, CKMBINDEX, TROPONINI in the last 168 hours. BNP (last 3 results) No results for input(s): PROBNP in the last 8760 hours. HbA1C: Recent Labs    11/22/19 0624  HGBA1C 7.1*   CBG: Recent Labs  Lab 11/22/19 0548 11/22/19 1150 11/22/19 1543 11/22/19 2123 11/23/19 0607  GLUCAP 207* 184* 239* 202* 188*   Lipid Profile: No results for input(s): CHOL, HDL, LDLCALC, TRIG, CHOLHDL, LDLDIRECT in the last 72 hours. Thyroid Function Tests: No results for input(s): TSH, T4TOTAL, FREET4, T3FREE, THYROIDAB in the last 72 hours. Anemia Panel: No results for input(s): VITAMINB12, FOLATE, FERRITIN, TIBC, IRON, RETICCTPCT in the last 72 hours. Sepsis Labs: Recent Labs  Lab 11/21/19 1625 11/22/19 0624  PROCALCITON <0.10 <0.10    Recent Results (from the past 240 hour(s))  Respiratory Panel by RT PCR (Flu A&B, Covid) - Nasopharyngeal Swab     Status: None   Collection Time: 11/21/19  1:52 PM   Specimen: Nasopharyngeal Swab    Result Value Ref Range Status   SARS Coronavirus 2 by RT PCR NEGATIVE NEGATIVE Final    Comment: (NOTE) SARS-CoV-2 target nucleic acids are NOT DETECTED. The SARS-CoV-2 RNA is generally detectable in upper respiratoy specimens during the acute phase of infection. The lowest concentration of SARS-CoV-2 viral copies this assay can detect is 131 copies/mL. A negative result does not preclude SARS-Cov-2 infection and should not be used as the sole basis for treatment or other patient management decisions. A negative result may occur with  improper specimen collection/handling, submission of specimen other than nasopharyngeal swab, presence of viral mutation(s) within the areas targeted by this assay, and inadequate number of viral copies (<131 copies/mL). A negative result must be combined with clinical observations, patient history, and epidemiological information. The expected result is Negative. Fact Sheet for Patients:  PinkCheek.be Fact Sheet for Healthcare  Providers:  GravelBags.it This test is not yet ap proved or cleared by the [place] and  has been authorized for detection and/or diagnosis of SARS-CoV-2 by FDA under an Emergency Use Authorization (EUA). This EUA will remain  in effect (meaning this test can be used) for the duration of the COVID-19 declaration under Section 564(b)(1) of the Act, 21 U.S.C. section 360bbb-3(b)(1), unless the authorization is terminated or revoked sooner.    Influenza A by PCR NEGATIVE NEGATIVE Final   Influenza B by PCR NEGATIVE NEGATIVE Final    Comment: (NOTE) The Xpert Xpress SARS-CoV-2/FLU/RSV assay is intended as an aid in  the diagnosis of influenza from Nasopharyngeal swab specimens and  should not be used as a sole basis for treatment. Nasal washings and  aspirates are unacceptable for Xpert Xpress SARS-CoV-2/FLU/RSV  testing. Fact Sheet for  Patients: PinkCheek.be Fact Sheet for Healthcare Providers: GravelBags.it This test is not yet approved or cleared by the [place] FDA and  has been authorized for detection and/or diagnosis of SARS-CoV-2 by  FDA under an Emergency Use Authorization (EUA). This EUA will remain  in effect (meaning this test can be used) for the duration of the  Covid-19 declaration under Section 564(b)(1) of the Act, 21  U.S.C. section 360bbb-3(b)(1), unless the authorization is  terminated or revoked. Performed at [facility], [place] [address], [place], [place] [address]          Radiology Studies: CT Angio Chest PE W and/or Wo Contrast  Result Date: 11/21/2019 CLINICAL DATA:  Shortness of breath. EXAM: CT ANGIOGRAPHY CHEST WITH CONTRAST TECHNIQUE: Multidetector CT imaging of the chest was performed using the standard protocol during bolus administration of intravenous contrast. Multiplanar CT image reconstructions and MIPs were obtained to evaluate the vascular anatomy. CONTRAST:  176mL OMNIPAQUE IOHEXOL 350 MG/ML SOLN COMPARISON:  07/13/2019 FINDINGS: Cardiovascular: Evaluation for pulmonary emboli is limited by respiratory motion artifact, suboptimal contrast timing, and patient body habitus. Given these significant limitations, no acute pulmonary embolism was detected.The heart size is enlarged. Aortic calcifications are noted without evidence for dissection or aneurysm. The main pulmonary artery is not significantly dilated. Mild coronary artery calcifications are noted. Mediastinum/Nodes: --No mediastinal or hilar lymphadenopathy. --No axillary lymphadenopathy. --No supraclavicular lymphadenopathy. --there are bilateral thyroid nodules measuring up to approximately 1.4 cm on the right. No further follow-up is required. --The esophagus is unremarkable Lungs/Pleura: There is a mosaic appearance of the lung parenchyma bilaterally. There is no  pneumothorax. No large pleural effusion. There is some bronchial wall thickening and mucus plugging at the lung bases. There is a developing airspace opacity in the medial right lower lobe (axial series 6, image 83). There is a relatively stable 8 mm pulmonary nodule in the lingula (axial series 6, image 51). Upper Abdomen: No acute abnormality. Musculoskeletal: No chest wall abnormality. No acute or significant osseous findings. Review of the MIP images confirms the above findings. IMPRESSION: 1. Evaluation for pulmonary emboli is severely limited as detailed above. Given these limitations, no acute pulmonary embolism was detected. 2. Developing airspace opacity in the medial right lower lobe is concerning for pneumonia. 3. There is an 8 mm pulmonary nodule in the lingula as detailed above. This is stable since prior study dated 07/13/2019. This represents nearly 6 months of stability. As such a follow-up CT of the chest is recommended in 12-18 months to document a full 18-24 months of stability. 4. Mosaic appearance of the lung parenchyma bilaterally. This is nonspecific and  may be secondary to small airway disease or other causes such as pulmonary hypertension. 5. Cardiomegaly Aortic Atherosclerosis (ICD10-I70.0). Electronically Signed   By: Constance Holster M.D.   On: 11/21/2019 22:35   DG Chest Port 1 View  Result Date: 11/21/2019 CLINICAL DATA:  Shortness of breath and cough for 4 days. EXAM: PORTABLE CHEST 1 VIEW COMPARISON:  Single-view of the chest 10/29/2019. CT chest 07/13/2019. FINDINGS: There is cardiomegaly and atherosclerosis. Diffuse hazy pulmonary opacities are present bilaterally. No pneumothorax or pleural effusion. IMPRESSION: Hazy pulmonary opacities could be due to edema or pneumonia. Cardiomegaly. Atherosclerosis. Electronically Signed   By: Inge Rise M.D.   On: 11/21/2019 14:41        Scheduled Meds: . amLODipine  5 mg Oral Daily  . atorvastatin  20 mg Oral q1800  .  cloNIDine  0.3 mg Oral BID  . clopidogrel  75 mg Oral Daily  . guaiFENesin  1,200 mg Oral BID  . insulin aspart  0-15 Units Subcutaneous TID WC  . insulin aspart  0-5 Units Subcutaneous QHS  . insulin glargine  15 Units Subcutaneous Daily  . irbesartan  300 mg Oral Daily  . methylPREDNISolone (SOLU-MEDROL) injection  80 mg Intravenous Q8H  . metoprolol tartrate  12.5 mg Oral BID  . mometasone-formoterol  1 puff Inhalation BID  . potassium chloride  20 mEq Oral Daily   Continuous Infusions: . sodium chloride 500 mL (11/23/19 0509)     LOS: 1 day    Time spent: 35 mins,More than 50% of that time was spent in counseling and/or coordination of care.      Shelly Coss, MD [facility] P2/05/2020, 9:36 AM

## 2019-11-23 NOTE — Progress Notes (Signed)
Nutrition Brief Note  RD consulted for assessment of nutritional status/ needs.   Wt Readings from Last 15 Encounters:  11/23/19 116.8 kg  10/30/19 114.4 kg  07/16/19 116.3 kg  05/05/19 113.2 kg  07/06/18 108.5 kg  02/24/18 111.8 kg  12/21/17 113.4 kg  04/30/16 108 kg  04/16/15 111 kg  05/06/13 109.3 kg   Sherry Fitzpatrick is a 69 y.o. female with history of asthma, diastolic CHF, CKD-3, CVA without residual deficit, HTN, morbid obesity, osteoarthritis and remote tobacco use presenting with progressive shortness of breath.  Pt admitted with COPD exacerbation.   Reviewed I/O's: +992 ml x 24 hours and +822 ml since admission  UOP: 600 ml x 24 ours  Spoke with pt, who was sitting in recliner chair. She reports feeling well and has a good appetiite. She consumed all of her breakfast this morning. Pt reports good appetite PTA and often consumes 3 meals per day ("I'm on my 76 year old granddaughter's schedule; she lives with me").   Pt denies any weight loss. Noted wt has been stable over the past 5 years.   Nutrition-Focused physical exam completed. Findings are no fat depletion, no muscle depletion, and mild edema.   Medications reviewed and include solu-medrol and lasix.  Lab Results  Component Value Date   HGBA1C 7.1 (H) 11/22/2019   PTA DM medications are none. Per ADA's Standards of Medical Care of Diabetes, glycemic targets for older adults who have multiple co-morbidities, cognitive impairments, and/or functional dependence should be less stringent (Hgb A1c <8.0-8.5).    Labs reviewed: CBGS: 184-239 (inpatient orders for glycemic control are 0-15 units insulin aspart TID with meals, 0-5 units inuslin aspart q HS, and 15 units insulin glargine daily).   Current diet order is heart healthy/ carb modified, patient is consuming approximately 100% of meals at this time. Labs and medications reviewed.   No nutrition interventions warranted at this time. If nutrition issues arise,  please consult RD.   Loistine Chance, RD, LDN, [place] Registered Dietitian II Certified Diabetes Care and Education Specialist Please refer to [facility] for RD and/or RD on-call/weekend/after hours pager

## 2019-11-23 NOTE — Progress Notes (Addendum)
Pharmacy Antibiotic Note  Sherry Fitzpatrick is a 69 y.o. female  With COPD and  pneumonia.  Pharmacy has been consulted for levaquin dosing (she was on doxycycline from 2/6>>2/8). She has an allergy to PCN and no history of administration of cephalosporins in Epic noted.  -WBC= 16.7, afebrile, SCr= 1.81, CrCl ~ 35, CT concerning for PNA  Plan: -Levaquin 750mg  IV q48hr -Will follow renal function and clinical progress    Height: 5\' 4"  (162.6 cm) Weight: 257 lb 6.4 oz (116.8 kg) IBW/kg (Calculated) : 54.7  Temp (24hrs), Avg:97.9 F (36.6 C), Min:97.7 F (36.5 C), Max:98.2 F (36.8 C)  Recent Labs  Lab 11/21/19 1432 11/22/19 0945  WBC 7.1 16.7*  CREATININE 1.20* 1.81*    Estimated Creatinine Clearance: 36.3 mL/min (A) (by C-G formula based on SCr of 1.81 mg/dL (H)).    Allergies  Allergen Reactions  . Morphine And Related Hives and Nausea And Vomiting  . Penicillins Hives    Has patient had a PCN reaction causing immediate rash, facial/tongue/throat swelling, SOB or lightheadedness with hypotension: Yes Has patient had a PCN reaction causing severe rash involving mucus membranes or skin necrosis: No Has patient had a PCN reaction that required hospitalization: No Has patient had a PCN reaction occurring within the last 10 years: No If all of the above answers are "NO", then may proceed with Cephalosporin use.  Marland Kitchen Percocet [Oxycodone-Acetaminophen] Nausea And Vomiting  . Ace Inhibitors Nausea And Vomiting and Cough  . Iodinated Diagnostic Agents Nausea And Vomiting       . Iodine Itching  . Etodolac Nausea And Vomiting  . Tramadol Nausea And Vomiting  . Vicodin [Hydrocodone-Acetaminophen] Nausea And Vomiting     Thank you for allowing pharmacy to be a part of this patient's care.  Hildred Laser, PharmD Clinical Pharmacist **Pharmacist phone directory can now be found on [email or web address] ([email or web address]).  Listed under [place].

## 2019-11-24 DIAGNOSIS — J9601 Acute respiratory failure with hypoxia: Secondary | ICD-10-CM

## 2019-11-24 LAB — GLUCOSE, CAPILLARY
Glucose-Capillary: 188 mg/dL — ABNORMAL HIGH (ref 70–99)
Glucose-Capillary: 254 mg/dL — ABNORMAL HIGH (ref 70–99)

## 2019-11-24 LAB — CBC WITH DIFFERENTIAL/PLATELET
Abs Immature Granulocytes: 0.3 10*3/uL — ABNORMAL HIGH (ref 0.00–0.07)
Basophils Absolute: 0 10*3/uL (ref 0.0–0.1)
Basophils Relative: 0 %
Eosinophils Absolute: 0 10*3/uL (ref 0.0–0.5)
Eosinophils Relative: 0 %
HCT: 37.5 % (ref 36.0–46.0)
Hemoglobin: 12.2 g/dL (ref 12.0–15.0)
Immature Granulocytes: 2 %
Lymphocytes Relative: 6 %
Lymphs Abs: 1.1 10*3/uL (ref 0.7–4.0)
MCH: 29.7 pg (ref 26.0–34.0)
MCHC: 32.5 g/dL (ref 30.0–36.0)
MCV: 91.2 fL (ref 80.0–100.0)
Monocytes Absolute: 0.5 10*3/uL (ref 0.1–1.0)
Monocytes Relative: 3 %
Neutro Abs: 15.4 10*3/uL — ABNORMAL HIGH (ref 1.7–7.7)
Neutrophils Relative %: 89 %
Platelets: 344 10*3/uL (ref 150–400)
RBC: 4.11 MIL/uL (ref 3.87–5.11)
RDW: 15.5 % (ref 11.5–15.5)
WBC: 17.4 10*3/uL — ABNORMAL HIGH (ref 4.0–10.5)
nRBC: 0 % (ref 0.0–0.2)

## 2019-11-24 LAB — BASIC METABOLIC PANEL
Anion gap: 12 (ref 5–15)
BUN: 55 mg/dL — ABNORMAL HIGH (ref 8–23)
CO2: 27 mmol/L (ref 22–32)
Calcium: 8.6 mg/dL — ABNORMAL LOW (ref 8.9–10.3)
Chloride: 99 mmol/L (ref 98–111)
Creatinine, Ser: 1.83 mg/dL — ABNORMAL HIGH (ref 0.44–1.00)
GFR calc Af Amer: 32 mL/min — ABNORMAL LOW (ref >60)
GFR calc non Af Amer: 27 mL/min — ABNORMAL LOW (ref >60)
Glucose, Bld: 261 mg/dL — ABNORMAL HIGH (ref 70–99)
Potassium: 4.6 mmol/L (ref 3.5–5.1)
Sodium: 138 mmol/L (ref 135–145)

## 2019-11-24 MED ORDER — PREDNISONE 50 MG PO TABS: 50.0000 mg | ORAL_TABLET | Freq: Every day | ORAL | 0 refills | Status: AC

## 2019-11-24 MED ORDER — METFORMIN HCL 500 MG PO TABS: 500.0000 mg | ORAL_TABLET | Freq: Two times a day (BID) | ORAL | 1 refills | Status: DC

## 2019-11-24 MED ORDER — BUDESONIDE-FORMOTEROL FUMARATE 160-4.5 MCG/ACT IN AERO: 2.0000 | INHALATION_SPRAY | Freq: Two times a day (BID) | RESPIRATORY_TRACT | 1 refills | Status: DC

## 2019-11-24 MED ORDER — FUROSEMIDE 40 MG PO TABS: 40.0000 mg | ORAL_TABLET | Freq: Every day | ORAL | Status: DC

## 2019-11-24 MED ORDER — IPRATROPIUM-ALBUTEROL 0.5-2.5 (3) MG/3ML IN SOLN: 3.0000 mL | RESPIRATORY_TRACT | 0 refills | Status: DC | PRN

## 2019-11-24 MED ORDER — LEVOFLOXACIN 750 MG PO TABS: 750.0000 mg | ORAL_TABLET | ORAL | 0 refills | Status: AC

## 2019-11-24 MED ORDER — GUAIFENESIN ER 600 MG PO TB12: 1200.0000 mg | ORAL_TABLET | Freq: Two times a day (BID) | ORAL | 0 refills | Status: DC

## 2019-11-24 NOTE — Consult Note (Signed)
   [facility] CM Inpatient Consult   11/24/2019  GWENETH FREDLUND 29-Jun-1951 [id number]    Patient was checked fora 30 day readmission, with 3 hospitalizations in the last 6 months, and has 23% high risk score for unplanned readmission, under her [facility] insurance plan.  Per MD brief narrative dated 11/23/19, patient is a 69 year old female with history of asthma, diastolic CHF, CKD stage IIIb, CVA without residual deficit, hypertension, moderate obesity, osteoarthritis, remote tobacco abuse,    presented with shortness of breath, was found to be wheezing on presentation. She was admitted for the management of asthma/COPD exacerbation.  Chest CT also showed stable possible right lower lobe pneumonia.    Permedical record review, was found that patient has no current primary care provider and isNOTcurrently a beneficiary of the attributed ACO (Accountable Care Organization) Registry in the [employer].  This patient is currently NOT covered for [employer]. Membership roster used to verify non-eligible status.  Transition of care CM note shows that patient is being arranged for a primary care provider, to establish care and follow-up post discharge.   For questions, please contact:  Edwena Felty A. Marlyce Mcdougald, BSN, RN-BC [facility] Liaison Cell: [phone]

## 2019-11-24 NOTE — Progress Notes (Signed)
Physical Therapy Treatment Patient Details Name: Sherry Fitzpatrick MRN: [id number] DOB: 1951/06/11 Today's Date: 11/24/2019    History of Present Illness 69 y.o. female with history of asthma, diastolic CHF, CKD-3, CVA without residual deficit, HTN, morbid obesity, osteoarthritis and remote tobacco use presenting with progressive shortness of breath.    PT Comments    Pt progressing with functional mobility this session, able to tolerate increased balance challenges and ambulation distance today while remaining on RA throughout session with SpO2 between 90-97% with all activities and HR in 80s bpm. Pt ambulated throughout room and performed toileting without assist. Pt ambulated down hallway without device and with supervision. Performed backwards walking, sidestepping and turns working on balance - all with supervision/min guard assist for safety. Performed x 10 sit<>stands without UE support for LE strengthening and endurance, instructed in performing this exercise at home for HEP. Will continue to follow acutely to progress mobility, no follow up services recommended.      Follow Up Recommendations  No PT follow up;Supervision - Intermittent     Equipment Recommendations  None recommended by PT    Recommendations for Other Services       Precautions / Restrictions Precautions Precautions: Fall Precaution Comments: watch sats Restrictions Weight Bearing Restrictions: No    Mobility  Bed Mobility Overal bed mobility: Modified Independent             General bed mobility comments: sit<>supine this session mod I with bedrail  Transfers Overall transfer level: Modified independent Equipment used: None Transfers: Sit to/from Stand Sit to Stand: Independent Stand pivot transfers: Independent          Ambulation/Gait Ambulation/Gait assistance: Supervision Gait Distance (Feet): 100 Feet Assistive device: None Gait Pattern/deviations: Step-through pattern;Decreased  stride length;Wide base of support Gait velocity: decreased   General Gait Details: ambulated to/from bathroom this session and down the hallway without assistvie device and with supervision, pt on room air this session and SpO2 to 90% with activity but returns to 95% following rest break       Balance        High level balance activites: Backward walking;Side stepping;Turns High Level Balance Comments: pt performed sidestepping, backwards walking and turns this session without assistive device - min guard assist for higher level balance challenges otherwise supervision for ambulation            Cognition Arousal/Alertness: Awake/alert Behavior During Therapy: WFL for tasks assessed/performed Overall Cognitive Status: Within Functional Limits for tasks assessed                                        Exercises Other Exercises Other Exercises: pt performed x 10 sit<>stands without UE support and with supervision working on LE strength and endurance, instructed to perform this exercise at home as well for HEP    General Comments General comments (skin integrity, edema, etc.): increased work of breathing with activity, on room air throughout session - SpO2 90-97% throughout session with activity, and HR in the 80s bpm      Pertinent Vitals/Pain Pain Assessment: No/denies pain    Home Living                      Prior Function            PT Goals (current goals can now be found in the care plan section) Progress towards PT  goals: Progressing toward goals    Frequency    Min 3X/week      PT Plan Current plan remains appropriate    Co-evaluation              AM-PAC PT "6 Clicks" Mobility   Outcome Measure  Help needed turning from your back to your side while in a flat bed without using bedrails?: None Help needed moving from lying on your back to sitting on the side of a flat bed without using bedrails?: None Help needed moving  to and from a bed to a chair (including a wheelchair)?: None Help needed standing up from a chair using your arms (e.g., wheelchair or bedside chair)?: None Help needed to walk in hospital room?: A Little Help needed climbing 3-5 steps with a railing? : A Little 6 Click Score: 22    End of Session   Activity Tolerance: Patient tolerated treatment well Patient left: in bed;with call bell/phone within reach Nurse Communication: Mobility status PT Visit Diagnosis: Difficulty in walking, not elsewhere classified (R26.2)     Time: 3709-6438 PT Time Calculation (min) (ACUTE ONLY): 16 min  Charges:  $Therapeutic Activity: 8-22 mins                     Netta Corrigan, PT, DPT, [facility] [place] 11/24/2019, 11:47 AM

## 2019-11-24 NOTE — TOC Progression Note (Signed)
Transition of Care [facility]) - Progression Note    Patient Details  Name: Sherry Fitzpatrick MRN: [id number] Date of Birth: [birth year]  Transition of Care [facility]) CM/SW Contact  Zenon Mayo, RN Phone Number: 11/24/2019, 3:19 PM  Clinical Narrative:     NCM spoke to patient about PCP, she states she does not have a preference of a PCP, but if she have to have a Monday apt she would like a morning apt, and any other day she would like an afternoon apt.  Alinda Sierras the secretary will set up this apt for patient tomorrow.       Expected Discharge Plan and Services           Expected Discharge Date: 11/24/19                                     Social Determinants of Health (SDOH) Interventions    Readmission Risk Interventions No flowsheet data found.

## 2019-11-24 NOTE — Progress Notes (Signed)
Oxygen needs assessment. EOB on RA saturations of 96%.  Ambulation in [place] ~51ft and oxygen saturations remained above 92%. Back in room on EOB and patient feels SOB however oxygen saturation remains 95%.

## 2019-11-24 NOTE — Discharge Summary (Signed)
Physician Discharge Summary  Sherry Fitzpatrick CWC:[id number] DOB: 1950/12/21 DOA: 11/21/2019  PCP: Patient, No Pcp Per  Admit date: 11/21/2019 Discharge date: 11/24/2019  Admitted From: Home Disposition:  Home  Discharge Condition:Stable CODE STATUS:FULL Diet recommendation: Heart Healthy  Brief/Interim Summary:  Patient is a 69 year old female with history of asthma, diastolic CHF, CKD stage IIIb, CVA without residual deficit, hypertension, moderate obesity, osteoarthritis, remote tobacco abuse presented with shortness of breath.  She was found to be wheezing on presentation.  She was admitted for the management of asthma/COPD exacerbation.  Chest CT also showed  possible right lower lobe pneumonia.    Her respiratory status has improved.  She did not qualify for home oxygen.  She will be discharged today to home.  Following problems were addressed during her hospitalization:   Acute hypoxic respiratory failure: Secondary to COPD/exacerbation.  Started on steroids, bronchodilators.  She is not on home oxygen. She did not qualify.  Asthma/COPD exacerbation: Past smoker.  Does not have diagnosis of COPD yet.  Takes albuterol inhaler at home.Added symbicort.  Continue bronchodilators, steroids.  Continue mucolytics.  We recommend to follow-up with pulmonology as an outpatient.  Right lower lobe pneumonia: No leukocytosis or fever.  Continue Levaquin to finish the course  History of chronic diastolic CHF: Currently euvolemic.  She received IV Lasix.  Continue lasix at home.  She needs to follow-up with Dr. Terrence Dupont as an outpatient.  AKI CKD stage IIIb: Baseline creatinine of 1.2-1.4.  Currently kidney function up from baseline.  Check BMP in a week.I will recommend to continue Lasix at decreased dose.  Hypertension: Currently blood pressure stable.  Continue home medicines  OSA: Not on Cpap at home.  Diabetes type 2 with hyperglycemia: Not on antidiabetic medication at home.  Hemoglobin  A1c was 7.1.  Started on Metformin   history of nonhemorrhagic CVA: Continue Lipitor, Plavix   Discharge Diagnoses:  Active Problems:   Shortness of breath   Acute respiratory failure with hypoxia (HCC)   COPD with acute exacerbation (HCC)   Acute asthma exacerbation    Discharge Instructions  Discharge Instructions    Diet - low sodium heart healthy   Complete by: As directed    Discharge instructions   Complete by: As directed    1)Please follow up with a PCP in a week.Do  CBC and BMP tests during the follow up. 2)Take prescribed medications as instructed.   Increase activity slowly   Complete by: As directed      Allergies as of 11/24/2019      Reactions   Morphine And Related Hives, Nausea And Vomiting   Penicillins Hives   Has patient had a PCN reaction causing immediate rash, facial/tongue/throat swelling, SOB or lightheadedness with hypotension: Yes Has patient had a PCN reaction causing severe rash involving mucus membranes or skin necrosis: No Has patient had a PCN reaction that required hospitalization: No Has patient had a PCN reaction occurring within the last 10 years: No If all of the above answers are "NO", then may proceed with Cephalosporin use.   Percocet [oxycodone-acetaminophen] Nausea And Vomiting   Ace Inhibitors Nausea And Vomiting, Cough   Iodinated Diagnostic Agents Nausea And Vomiting      Iodine Itching   Etodolac Nausea And Vomiting   Tramadol Nausea And Vomiting   Vicodin [hydrocodone-acetaminophen] Nausea And Vomiting      Medication List    STOP taking these medications   metoprolol tartrate 25 MG tablet Commonly known as: LOPRESSOR  TAKE these medications   acetaminophen 500 MG tablet Commonly known as: TYLENOL Take 1,000 mg by mouth every 6 (six) hours as needed for headache (pain).   albuterol 108 (90 Base) MCG/ACT inhaler Commonly known as: VENTOLIN HFA Inhale 2 puffs into the lungs every 6 (six) hours as needed for  wheezing. What changed: reasons to take this   amLODipine 10 MG tablet Commonly known as: NORVASC Take 1 tablet (10 mg total) by mouth daily. What changed: when to take this   atorvastatin 80 MG tablet Commonly known as: LIPITOR Take 1 tablet (80 mg total) by mouth daily. What changed: when to take this   budesonide-formoterol 160-4.5 MCG/ACT inhaler Commonly known as: Symbicort Inhale 2 puffs into the lungs 2 (two) times daily.   cloNIDine 0.3 MG tablet Commonly known as: CATAPRES Take 1 tablet (0.3 mg total) by mouth 2 (two) times daily. What changed:   when to take this  additional instructions   clopidogrel 75 MG tablet Commonly known as: PLAVIX Take 1 tablet (75 mg total) by mouth daily.   furosemide 40 MG tablet Commonly known as: LASIX Take 1 tablet (40 mg total) by mouth daily at 2 PM.   guaiFENesin 600 MG 12 hr tablet Commonly known as: MUCINEX Take 2 tablets (1,200 mg total) by mouth 2 (two) times daily.   ipratropium-albuterol 0.5-2.5 (3) MG/3ML Soln Commonly known as: DUONEB Take 3 mLs by nebulization every 4 (four) hours as needed.   levofloxacin 750 MG tablet Commonly known as: Levaquin Take 1 tablet (750 mg total) by mouth every other day for 6 days.   metoprolol succinate 25 MG 24 hr tablet Commonly known as: TOPROL-XL Take 25 mg by mouth daily at 2 PM.   potassium chloride 10 MEQ tablet Commonly known as: KLOR-CON Take 2 tablets (20 mEq total) by mouth daily. What changed: when to take this   predniSONE 50 MG tablet Commonly known as: DELTASONE Take 1 tablet (50 mg total) by mouth daily for 5 days. Start taking on: November 25, 2019   valsartan 320 MG tablet Commonly known as: DIOVAN Take 320 mg by mouth daily at 2 PM.       Allergies  Allergen Reactions  . Morphine And Related Hives and Nausea And Vomiting  . Penicillins Hives    Has patient had a PCN reaction causing immediate rash, facial/tongue/throat swelling, SOB or  lightheadedness with hypotension: Yes Has patient had a PCN reaction causing severe rash involving mucus membranes or skin necrosis: No Has patient had a PCN reaction that required hospitalization: No Has patient had a PCN reaction occurring within the last 10 years: No If all of the above answers are "NO", then may proceed with Cephalosporin use.  [place] Percocet [Oxycodone-Acetaminophen] Nausea And Vomiting  . Ace Inhibitors Nausea And Vomiting and Cough  . Iodinated Diagnostic Agents Nausea And Vomiting       . Iodine Itching  . Etodolac Nausea And Vomiting  . Tramadol Nausea And Vomiting  . Vicodin [Hydrocodone-Acetaminophen] Nausea And Vomiting    Consultations:  None   Procedures/Studies: CT Angio Chest PE W and/or Wo Contrast  Result Date: 11/21/2019 CLINICAL DATA:  Shortness of breath. EXAM: CT ANGIOGRAPHY CHEST WITH CONTRAST TECHNIQUE: Multidetector CT imaging of the chest was performed using the standard protocol during bolus administration of intravenous contrast. Multiplanar CT image reconstructions and MIPs were obtained to evaluate the vascular anatomy. CONTRAST:  160mL OMNIPAQUE IOHEXOL 350 MG/ML SOLN COMPARISON:  07/13/2019 FINDINGS: Cardiovascular: Evaluation for  pulmonary emboli is limited by respiratory motion artifact, suboptimal contrast timing, and patient body habitus. Given these significant limitations, no acute pulmonary embolism was detected.The heart size is enlarged. Aortic calcifications are noted without evidence for dissection or aneurysm. The main pulmonary artery is not significantly dilated. Mild coronary artery calcifications are noted. Mediastinum/Nodes: --No mediastinal or hilar lymphadenopathy. --No axillary lymphadenopathy. --No supraclavicular lymphadenopathy. --there are bilateral thyroid nodules measuring up to approximately 1.4 cm on the right. No further follow-up is required. --The esophagus is unremarkable Lungs/Pleura: There is a mosaic appearance of  the lung parenchyma bilaterally. There is no pneumothorax. No large pleural effusion. There is some bronchial wall thickening and mucus plugging at the lung bases. There is a developing airspace opacity in the medial right lower lobe (axial series 6, image 83). There is a relatively stable 8 mm pulmonary nodule in the lingula (axial series 6, image 51). Upper Abdomen: No acute abnormality. Musculoskeletal: No chest wall abnormality. No acute or significant osseous findings. Review of the MIP images confirms the above findings. IMPRESSION: 1. Evaluation for pulmonary emboli is severely limited as detailed above. Given these limitations, no acute pulmonary embolism was detected. 2. Developing airspace opacity in the medial right lower lobe is concerning for pneumonia. 3. There is an 8 mm pulmonary nodule in the lingula as detailed above. This is stable since prior study dated 07/13/2019. This represents nearly 6 months of stability. As such a follow-up CT of the chest is recommended in 12-18 months to document a full 18-24 months of stability. 4. Mosaic appearance of the lung parenchyma bilaterally. This is nonspecific and may be secondary to small airway disease or other causes such as pulmonary hypertension. 5. Cardiomegaly Aortic Atherosclerosis (ICD10-I70.0). Electronically Signed   By: Constance Holster M.D.   On: 11/21/2019 22:35   CT CORONARY MORPH W/CTA COR W/SCORE W/CA W/CM &/OR WO/CM  Addendum Date: 10/30/2019   ADDENDUM REPORT: 10/30/2019 14:01 EXAM: Cardiac/Coronary  CT TECHNIQUE: The patient was scanned on a [employer]. FINDINGS: A 120 kV prospective scan was triggered in the descending thoracic aorta at 111 HU's. Axial non-contrast 3 mm slices were carried out through the heart. The data set was analyzed on a dedicated work station and scored using the [place]. Gantry rotation speed was 250 msecs and collimation was .6 mm. No beta blockade and 0.8 mg of sl NTG was given. The 3D  data set was reconstructed in 5% intervals of the 67-82 % of the R-R cycle. Diastolic phases were analyzed on a dedicated work station using MPR, MIP and VRT modes. The patient received 80 cc of contrast. Aorta:  Normal size.  No calcifications.  No dissection. Aortic Valve:  Trileaflet.  No calcifications. Coronary Arteries:  Normal coronary origin.  Right dominance. RCA is a co dominant artery that gives rise to PDA. There is significant motion artifact in mid and distal RCA. No obvious plaque but study quality reduced. Left main is a large artery that gives rise to LAD and LCX arteries. There is no plaque. LAD is a large vessel that has a as least mild and possibly moderate calcified plaque in the proximal and mid LAD with associated stenosis of 50-69%. The study is very limited by noise and motion artifact. LCX is a co-dominant artery that gives rise to one large OM1 branch and a RPLB. There is some motion artifact but no obvious plaque. Other findings: Normal pulmonary vein drainage into the left atrium. Normal let atrial appendage without  a thrombus. Normal size of the pulmonary artery. IMPRESSION: 1. Coronary calcium score of 71. This was 74th percentile for age and sex matched control. 2.  Normal coronary origin with co dominance. 3. Technically difficult study limited by significant motion artifact. There is mild to moderate atherosclerotic plaque in the proximal to mid LAD. CAD-RADS 3. 4. Consider symptom guided anti-ischemic and preventive pharmacotherapy as well as risk factor modification per guideline-directed care. 5.  Consider non-invasive testing for ischemia. 6.  This study has been sent for FFR analysis. Fransico Him Electronically Signed   By: Fransico Him   On: 10/30/2019 14:01   Result Date: 10/30/2019 EXAM: OVER-READ INTERPRETATION  CT CHEST The following report is an over-read performed by radiologist Dr. Abigail Miyamoto of [facility], [place] on 10/30/2019. This over-read does not include  interpretation of cardiac or coronary anatomy or pathology. The coronary CTA interpretation by the cardiologist is attached. COMPARISON:  Chest radiographs most recent 10/29/2019. Diagnostic chest CT 07/13/2019. FINDINGS: Vascular: Aortic atherosclerosis.  Tortuous thoracic aorta. No central pulmonary embolism, on this non-dedicated study. Mediastinum/Nodes: Prominent mediastinal nodes are similar to 07/13/2019. Example low right paratracheal node of 1.3 cm on 05/12. No hilar adenopathy. Lungs/Pleura: No pleural fluid. 5 mm left upper lobe pulmonary nodule on 15/13, similar on the most recent exam and in an area of airspace disease on the 07/02/2018 CT. Upper Abdomen: Hepatic steatosis. Normal imaged portions of the spleen, stomach. Musculoskeletal: Thoracic spondylosis. IMPRESSION: 1.  No acute findings in the imaged extracardiac chest. 2.  Aortic Atherosclerosis (ICD10-I70.0). 3. Left upper lobe 5 mm pulmonary nodule. No follow-up needed if patient is low-risk. Non-contrast chest CT can be considered in 12 months if patient is high-risk. This recommendation follows the consensus statement: Guidelines for Management of Incidental Pulmonary Nodules Detected on CT Images: From the Fleischner Society 2017; Radiology 2017; 284:228-243. 4. Hepatic steatosis. 5. Similar prominent mediastinal nodes, incompletely imaged. Most likely reactive. Electronically Signed: By: Abigail Miyamoto M.D. On: 10/30/2019 12:26   DG Chest Port 1 View  Result Date: 11/21/2019 CLINICAL DATA:  Shortness of breath and cough for 4 days. EXAM: PORTABLE CHEST 1 VIEW COMPARISON:  Single-view of the chest 10/29/2019. CT chest 07/13/2019. FINDINGS: There is cardiomegaly and atherosclerosis. Diffuse hazy pulmonary opacities are present bilaterally. No pneumothorax or pleural effusion. IMPRESSION: Hazy pulmonary opacities could be due to edema or pneumonia. Cardiomegaly. Atherosclerosis. Electronically Signed   By: Inge Rise M.D.   On:  11/21/2019 14:41   DG Chest Portable 1 View  Result Date: 10/29/2019 CLINICAL DATA:  Chest pain. EXAM: PORTABLE CHEST 1 VIEW COMPARISON:  Chest CT 07/13/2019, chest radiograph 07/13/2019 FINDINGS: Unchanged cardiomegaly. No airspace consolidation. Mild diffuse pulmonary interstitial prominence is unchanged. No evidence of pleural effusion or pneumothorax. No acute bony abnormality. Thoracic spondylosis with multilevel anterolateral osteophytes. Overlying cardiac monitoring leads. IMPRESSION: Unchanged cardiomegaly. Redemonstrated mild diffuse pulmonary interstitial prominence which may reflect interstitial edema. No airspace consolidation. Electronically Signed   By: Kellie Simmering DO   On: 10/29/2019 13:24   CT CORONARY FRACTIONAL FLOW RESERVE DATA PREP  Result Date: 11/12/2019 EXAM: FFRCT ANALYSIS FINDINGS: FFRct analysis was performed on the original cardiac CT angiogram dataset. Diagrammatic representation of the FFRct analysis is provided in a separate PDF document in PACS. This dictation was created using the PDF document and an interactive 3D model of the results. 3D model is not available in the EMR/PACS. Normal FFR range is >0.80. 1. Left Main: No significant stenosis.  LM FFR =  0.98. 2. LAD: Possible flow limiting lesion in distal LAD and Diagonal. Proximal FFR = 0.95, Mid FFR = 0.82, Distal FFR = 0.78. Diagonal #1 Proximal FFR = 0.90, Mid and distal FFR = 0.71. 3. LCX: No significant stenosis. Proximal FFR = 0.92, Mid FFR = 0.91, Distal FFR = 0.87. Distal OM FFR = 0.83. 4. RCA: Possible flow limiting lesions in the distal RCA. Proximal FFR = 0.98, Mid FFR = 0.79, Distal FFR = 0.74. IMPRESSION: 1. CT FFR analysis showed possible flow limiting lesions in the distal LAD, diagonal and mid to distal RCA. Fransico Him Electronically Signed   By: Fransico Him   On: 11/12/2019 08:25       Subjective: Patient seen and examined at the bedside this morning.  Hemodynamically stable for discharge  today.  Discharge Exam: Vitals:   11/24/19 0700 11/24/19 0941  BP:  (!) 159/89  Pulse: (!) 58 65  Resp: 16 20  Temp:  98.6 F (37 C)  SpO2: 100% 98%   Vitals:   11/24/19 0637 11/24/19 0638 11/24/19 0700 11/24/19 0941  BP: 139/78   (!) 159/89  Pulse: (!) 56  (!) 58 65  Resp: 20  16 20   Temp:    98.6 F (37 C)  TempSrc:    Oral  SpO2: 100%  100% 98%  Weight:  118 kg    Height:        General: Pt is alert, awake, not in acute distress Cardiovascular: RRR, S1/S2 +, no rubs, no gallops Respiratory: Mild bilateral expiratory wheezes Abdominal: Soft, NT, ND, bowel sounds + Extremities: no edema, no cyanosis    The results of significant diagnostics from this hospitalization (including imaging, microbiology, ancillary and laboratory) are listed below for reference.     Microbiology: Recent Results (from the past 240 hour(s))  Respiratory Panel by RT PCR (Flu A&B, Covid) - Nasopharyngeal Swab     Status: None   Collection Time: 11/21/19  1:52 PM   Specimen: Nasopharyngeal Swab  Result Value Ref Range Status   SARS Coronavirus 2 by RT PCR NEGATIVE NEGATIVE Final    Comment: (NOTE) SARS-CoV-2 target nucleic acids are NOT DETECTED. The SARS-CoV-2 RNA is generally detectable in upper respiratoy specimens during the acute phase of infection. The lowest concentration of SARS-CoV-2 viral copies this assay can detect is 131 copies/mL. A negative result does not preclude SARS-Cov-2 infection and should not be used as the sole basis for treatment or other patient management decisions. A negative result may occur with  improper specimen collection/handling, submission of specimen other than nasopharyngeal swab, presence of viral mutation(s) within the areas targeted by this assay, and inadequate number of viral copies (<131 copies/mL). A negative result must be combined with clinical observations, patient history, and epidemiological information. The expected result is  Negative. Fact Sheet for Patients:  PinkCheek.be Fact Sheet for Healthcare Providers:  GravelBags.it This test is not yet ap proved or cleared by the [place] FDA and  has been authorized for detection and/or diagnosis of SARS-CoV-2 by FDA under an Emergency Use Authorization (EUA). This EUA will remain  in effect (meaning this test can be used) for the duration of the COVID-19 declaration under Section 564(b)(1) of the Act, 21 U.S.C. section 360bbb-3(b)(1), unless the authorization is terminated or revoked sooner.    Influenza A by PCR NEGATIVE NEGATIVE Final   Influenza B by PCR NEGATIVE NEGATIVE Final    Comment: (NOTE) The Xpert Xpress SARS-CoV-2/FLU/RSV assay is intended as an aid  in  the diagnosis of influenza from Nasopharyngeal swab specimens and  should not be used as a sole basis for treatment. Nasal washings and  aspirates are unacceptable for Xpert Xpress SARS-CoV-2/FLU/RSV  testing. Fact Sheet for Patients: PinkCheek.be Fact Sheet for Healthcare Providers: GravelBags.it This test is not yet approved or cleared by the [place] FDA and  has been authorized for detection and/or diagnosis of SARS-CoV-2 by  FDA under an Emergency Use Authorization (EUA). This EUA will remain  in effect (meaning this test can be used) for the duration of the  Covid-19 declaration under Section 564(b)(1) of the Act, 21  U.S.C. section 360bbb-3(b)(1), unless the authorization is  terminated or revoked. Performed at [facility], [place] [address] [place], [place] [address]      Labs: BNP (last 3 results) Recent Labs    07/13/19 0921 10/29/19 1207 11/21/19 1432  BNP 96.7 45.3 37.6   Basic Metabolic Panel: Recent Labs  Lab 11/21/19 1432 11/21/19 1555 11/22/19 0945 11/24/19 0855  NA 139 140 137 138  K 3.5 3.8 4.3 4.6  CL 101  --  100 99  CO2 27  --   25 27  GLUCOSE 169*  --  270* 261*  BUN 12  --  29* 55*  CREATININE 1.20*  --  1.81* 1.83*  CALCIUM 8.7*  --  8.8* 8.6*   Liver Function Tests: Recent Labs  Lab 11/21/19 1432  AST 21  ALT 19  ALKPHOS 75  BILITOT 0.4  PROT 7.1  ALBUMIN 3.3*   No results for input(s): LIPASE, AMYLASE in the last 168 hours. No results for input(s): AMMONIA in the last 168 hours. CBC: Recent Labs  Lab 11/21/19 1432 11/21/19 1555 11/22/19 0945 11/24/19 0855  WBC 7.1  --  16.7* 17.4*  NEUTROABS 3.0  --   --  15.4*  HGB 12.0 11.2* 12.2 12.2  HCT 36.6 33.0* 37.6 37.5  MCV 89.9  --  92.2 91.2  PLT 322  --  339 344   Cardiac Enzymes: No results for input(s): CKTOTAL, CKMB, CKMBINDEX, TROPONINI in the last 168 hours. BNP: Invalid input(s): POCBNP CBG: Recent Labs  Lab 11/23/19 0607 11/23/19 1134 11/23/19 1614 11/23/19 2206 11/24/19 0628  GLUCAP 188* 211* 286* 223* 188*   D-Dimer Recent Labs    11/21/19 1432  DDIMER 2.05*   Hgb A1c Recent Labs    11/22/19 0624  HGBA1C 7.1*   Lipid Profile No results for input(s): CHOL, HDL, LDLCALC, TRIG, CHOLHDL, LDLDIRECT in the last 72 hours. Thyroid function studies No results for input(s): TSH, T4TOTAL, T3FREE, THYROIDAB in the last 72 hours.  Invalid input(s): FREET3 Anemia work up No results for input(s): VITAMINB12, FOLATE, FERRITIN, TIBC, IRON, RETICCTPCT in the last 72 hours. Urinalysis    Component Value Date/Time   COLORURINE YELLOW 10/29/2019 1604   APPEARANCEUR HAZY (A) 10/29/2019 1604   LABSPEC 1.018 10/29/2019 1604   PHURINE 7.0 10/29/2019 1604   GLUCOSEU NEGATIVE 10/29/2019 1604   HGBUR NEGATIVE 10/29/2019 [place] 10/29/2019 [place] 10/29/2019 1604   PROTEINUR 30 (A) 10/29/2019 1604   UROBILINOGEN 0.2 04/14/2015 1451   NITRITE NEGATIVE 10/29/2019 1604   LEUKOCYTESUR NEGATIVE 10/29/2019 1604   Sepsis Labs Invalid input(s): PROCALCITONIN,  WBC,   LACTICIDVEN Microbiology Recent Results (from the past 240 hour(s))  Respiratory Panel by RT PCR (Flu A&B, Covid) - Nasopharyngeal Swab     Status: None   Collection Time: 11/21/19  1:52  PM   Specimen: Nasopharyngeal Swab  Result Value Ref Range Status   SARS Coronavirus 2 by RT PCR NEGATIVE NEGATIVE Final    Comment: (NOTE) SARS-CoV-2 target nucleic acids are NOT DETECTED. The SARS-CoV-2 RNA is generally detectable in upper respiratoy specimens during the acute phase of infection. The lowest concentration of SARS-CoV-2 viral copies this assay can detect is 131 copies/mL. A negative result does not preclude SARS-Cov-2 infection and should not be used as the sole basis for treatment or other patient management decisions. A negative result may occur with  improper specimen collection/handling, submission of specimen other than nasopharyngeal swab, presence of viral mutation(s) within the areas targeted by this assay, and inadequate number of viral copies (<131 copies/mL). A negative result must be combined with clinical observations, patient history, and epidemiological information. The expected result is Negative. Fact Sheet for Patients:  PinkCheek.be Fact Sheet for Healthcare Providers:  GravelBags.it This test is not yet ap proved or cleared by the [place] FDA and  has been authorized for detection and/or diagnosis of SARS-CoV-2 by FDA under an Emergency Use Authorization (EUA). This EUA will remain  in effect (meaning this test can be used) for the duration of the COVID-19 declaration under Section 564(b)(1) of the Act, 21 U.S.C. section 360bbb-3(b)(1), unless the authorization is terminated or revoked sooner.    Influenza A by PCR NEGATIVE NEGATIVE Final   Influenza B by PCR NEGATIVE NEGATIVE Final    Comment: (NOTE) The Xpert Xpress SARS-CoV-2/FLU/RSV assay is intended as an aid in  the diagnosis of influenza  from Nasopharyngeal swab specimens and  should not be used as a sole basis for treatment. Nasal washings and  aspirates are unacceptable for Xpert Xpress SARS-CoV-2/FLU/RSV  testing. Fact Sheet for Patients: PinkCheek.be Fact Sheet for Healthcare Providers: GravelBags.it This test is not yet approved or cleared by the [place] FDA and  has been authorized for detection and/or diagnosis of SARS-CoV-2 by  FDA under an Emergency Use Authorization (EUA). This EUA will remain  in effect (meaning this test can be used) for the duration of the  Covid-19 declaration under Section 564(b)(1) of the Act, 21  U.S.C. section 360bbb-3(b)(1), unless the authorization is  terminated or revoked. Performed at [facility], [place] [address], [place], [place] [address]     Please note: You were cared for by a hospitalist during your hospital stay. Once you are discharged, your primary care physician will handle any further medical issues. Please note that NO REFILLS for any discharge medications will be authorized once you are discharged, as it is imperative that you return to your primary care physician (or establish a relationship with a primary care physician if you do not have one) for your post hospital discharge needs so that they can reassess your need for medications and monitor your lab values.    Time coordinating discharge: 40 minutes  SIGNED:   Shelly Coss, MD  [facility] 11/24/2019, 11:07 AM Pager [phone]  If 7PM-7AM, please contact night-coverage [email or web address] Password [email or web address]

## 2019-11-24 NOTE — Progress Notes (Signed)
Patient awaiting delivery of nebulizer for home medications and daughter will come to the bedside prior to discharge for additional teaching of nebulizer.

## 2019-12-01 DIAGNOSIS — I10 Essential (primary) hypertension: Secondary | ICD-10-CM | POA: Diagnosis not present

## 2019-12-01 DIAGNOSIS — I209 Angina pectoris, unspecified: Secondary | ICD-10-CM | POA: Diagnosis not present

## 2019-12-01 DIAGNOSIS — E119 Type 2 diabetes mellitus without complications: Secondary | ICD-10-CM | POA: Diagnosis not present

## 2019-12-01 DIAGNOSIS — E785 Hyperlipidemia, unspecified: Secondary | ICD-10-CM | POA: Diagnosis not present

## 2019-12-22 DIAGNOSIS — J441 Chronic obstructive pulmonary disease with (acute) exacerbation: Secondary | ICD-10-CM | POA: Diagnosis not present

## 2019-12-30 DIAGNOSIS — E1149 Type 2 diabetes mellitus with other diabetic neurological complication: Secondary | ICD-10-CM | POA: Diagnosis not present

## 2019-12-30 DIAGNOSIS — J449 Chronic obstructive pulmonary disease, unspecified: Secondary | ICD-10-CM | POA: Diagnosis not present

## 2019-12-30 DIAGNOSIS — I10 Essential (primary) hypertension: Secondary | ICD-10-CM | POA: Diagnosis not present

## 2019-12-30 DIAGNOSIS — I5032 Chronic diastolic (congestive) heart failure: Secondary | ICD-10-CM | POA: Diagnosis not present

## 2020-01-22 DIAGNOSIS — J441 Chronic obstructive pulmonary disease with (acute) exacerbation: Secondary | ICD-10-CM | POA: Diagnosis not present

## 2020-01-27 DIAGNOSIS — E1149 Type 2 diabetes mellitus with other diabetic neurological complication: Secondary | ICD-10-CM | POA: Diagnosis not present

## 2020-01-27 DIAGNOSIS — I5032 Chronic diastolic (congestive) heart failure: Secondary | ICD-10-CM | POA: Diagnosis not present

## 2020-01-27 DIAGNOSIS — Z7984 Long term (current) use of oral hypoglycemic drugs: Secondary | ICD-10-CM | POA: Diagnosis not present

## 2020-01-27 DIAGNOSIS — I10 Essential (primary) hypertension: Secondary | ICD-10-CM | POA: Diagnosis not present

## 2020-01-28 ENCOUNTER — Other Ambulatory Visit: Payer: Self-pay

## 2020-01-28 NOTE — Patient Outreach (Signed)
[facility]) Care Management  01/28/2020  Sherry Fitzpatrick 08/23/1951 [id number]   Telephone Screen  Referral Date: 01/28/2020 Referral Source: MD Office Referral Reason: "HF,HTN, polypharmacy, doesn't understand meds" Insurance: [employer]   Outreach attempt # 1to patient. No answer at present. RN CM left HIPAA compliant voicemail message along with contact info.      Plan: RN CM will make outreach attempt to patient within 3-4 business days. RN CM will send unsuccessful outreach letter to patient.   Enzo Montgomery, RN,BSN,CCM [place] [facility] Telephonic Care Management Coordinator Direct Phone: [phone] Toll Free: [phone] Fax: [phone]

## 2020-02-01 ENCOUNTER — Other Ambulatory Visit: Payer: Self-pay

## 2020-02-01 NOTE — Patient Outreach (Signed)
[place] [facility]) Care Management  02/01/2020  CELESTIAL BARNFIELD [birth year] [id number]   Telephone Screen  Referral Date: 01/28/2020 Referral Source: MD Office Referral Reason: "HF,HTN, polypharmacy, doesn't understand meds" Insurance: [employer]     Outreach attempt #2 to patient. No answer after several rings.       Plan: RN CM will make outreach attempt to patient within 3-4 business days.    Enzo Montgomery, RN,BSN,CCM [place] Management Telephonic Care Management Coordinator Direct Phone: [phone] Toll Free: [phone] Fax: [phone]

## 2020-02-04 ENCOUNTER — Other Ambulatory Visit: Payer: Self-pay

## 2020-02-04 DIAGNOSIS — I10 Essential (primary) hypertension: Secondary | ICD-10-CM | POA: Diagnosis not present

## 2020-02-04 DIAGNOSIS — E1149 Type 2 diabetes mellitus with other diabetic neurological complication: Secondary | ICD-10-CM | POA: Diagnosis not present

## 2020-02-04 DIAGNOSIS — R413 Other amnesia: Secondary | ICD-10-CM | POA: Diagnosis not present

## 2020-02-04 DIAGNOSIS — M25552 Pain in left hip: Secondary | ICD-10-CM | POA: Diagnosis not present

## 2020-02-04 DIAGNOSIS — Z7984 Long term (current) use of oral hypoglycemic drugs: Secondary | ICD-10-CM | POA: Diagnosis not present

## 2020-02-04 NOTE — Patient Outreach (Signed)
[place]) Care Management  02/04/2020  Sherry Fitzpatrick [birth year] [id number]    Telephone Screen  Referral Date:01/28/2020 Referral Source:MD Office Referral Reason:"HF,HTN, polypharmacy, doesn't understand meds" Insurance:[employer]   Outreach attempt #3 to patient. No answer after several rings.    Plan: RN CM will close case if no response from letter or return call from patient.   Enzo Montgomery, RN,BSN,CCM [place] Management Telephonic Care Management Coordinator Direct Phone: [phone] Toll Free: [phone] Fax: [phone]

## 2020-02-05 ENCOUNTER — Encounter: Payer: Self-pay | Admitting: Neurology

## 2020-02-10 ENCOUNTER — Other Ambulatory Visit: Payer: Self-pay

## 2020-02-10 NOTE — Patient Outreach (Signed)
[facility]) Care Management  02/10/2020  Sherry Fitzpatrick 07-17-1951 [id number]   Telephone Screen  Referral Date:01/28/2020 Referral Source:MD Office Referral Reason:"HF,HTN, polypharmacy, doesn't understand meds" Insurance:[employer]   Multiple attempts to establish contact with patient without success. No response from letter mailed to patient. Case is being closed at this time.     Plan: RN CM will close case at this time.   Enzo Montgomery, RN,BSN,CCM [place] Management Telephonic Care Management Coordinator Direct Phone: [phone] Toll Free: [phone] Fax: [phone]

## 2020-02-21 DIAGNOSIS — J441 Chronic obstructive pulmonary disease with (acute) exacerbation: Secondary | ICD-10-CM | POA: Diagnosis not present

## 2020-03-02 DIAGNOSIS — I208 Other forms of angina pectoris: Secondary | ICD-10-CM | POA: Diagnosis not present

## 2020-03-02 DIAGNOSIS — I10 Essential (primary) hypertension: Secondary | ICD-10-CM | POA: Diagnosis not present

## 2020-03-02 DIAGNOSIS — E119 Type 2 diabetes mellitus without complications: Secondary | ICD-10-CM | POA: Diagnosis not present

## 2020-03-02 DIAGNOSIS — E785 Hyperlipidemia, unspecified: Secondary | ICD-10-CM | POA: Diagnosis not present

## 2020-03-23 DIAGNOSIS — J441 Chronic obstructive pulmonary disease with (acute) exacerbation: Secondary | ICD-10-CM | POA: Diagnosis not present

## 2020-03-27 ENCOUNTER — Other Ambulatory Visit: Payer: Self-pay

## 2020-03-27 ENCOUNTER — Observation Stay (HOSPITAL_COMMUNITY): Payer: Medicare HMO

## 2020-03-27 ENCOUNTER — Encounter (HOSPITAL_COMMUNITY): Payer: Self-pay | Admitting: Emergency Medicine

## 2020-03-27 ENCOUNTER — Observation Stay (HOSPITAL_COMMUNITY)
Admission: EM | Admit: 2020-03-27 | Discharge: 2020-03-28 | Disposition: A | Discharge status: Home / Self Care | Payer: Medicare HMO | Attending: Family Medicine | Admitting: Family Medicine

## 2020-03-27 ENCOUNTER — Emergency Department (HOSPITAL_COMMUNITY): Payer: Medicare HMO

## 2020-03-27 ENCOUNTER — Encounter (HOSPITAL_COMMUNITY): Payer: Medicare HMO

## 2020-03-27 DIAGNOSIS — Z880 Allergy status to penicillin: Secondary | ICD-10-CM | POA: Insufficient documentation

## 2020-03-27 DIAGNOSIS — Z20822 Contact with and (suspected) exposure to covid-19: Secondary | ICD-10-CM | POA: Insufficient documentation

## 2020-03-27 DIAGNOSIS — M25549 Pain in joints of unspecified hand: Secondary | ICD-10-CM

## 2020-03-27 DIAGNOSIS — Z885 Allergy status to narcotic agent status: Secondary | ICD-10-CM | POA: Diagnosis not present

## 2020-03-27 DIAGNOSIS — H538 Other visual disturbances: Secondary | ICD-10-CM | POA: Diagnosis not present

## 2020-03-27 DIAGNOSIS — I639 Cerebral infarction, unspecified: Secondary | ICD-10-CM | POA: Diagnosis not present

## 2020-03-27 DIAGNOSIS — D649 Anemia, unspecified: Secondary | ICD-10-CM | POA: Diagnosis not present

## 2020-03-27 DIAGNOSIS — N1831 Chronic kidney disease, stage 3a: Secondary | ICD-10-CM

## 2020-03-27 DIAGNOSIS — R4789 Other speech disturbances: Secondary | ICD-10-CM | POA: Diagnosis not present

## 2020-03-27 DIAGNOSIS — Z8673 Personal history of transient ischemic attack (TIA), and cerebral infarction without residual deficits: Secondary | ICD-10-CM | POA: Diagnosis not present

## 2020-03-27 DIAGNOSIS — E7800 Pure hypercholesterolemia, unspecified: Secondary | ICD-10-CM | POA: Diagnosis not present

## 2020-03-27 DIAGNOSIS — E785 Hyperlipidemia, unspecified: Secondary | ICD-10-CM | POA: Diagnosis not present

## 2020-03-27 DIAGNOSIS — N183 Chronic kidney disease, stage 3 unspecified: Secondary | ICD-10-CM | POA: Insufficient documentation

## 2020-03-27 DIAGNOSIS — R52 Pain, unspecified: Secondary | ICD-10-CM | POA: Diagnosis not present

## 2020-03-27 DIAGNOSIS — Z96641 Presence of right artificial hip joint: Secondary | ICD-10-CM | POA: Diagnosis not present

## 2020-03-27 DIAGNOSIS — I5032 Chronic diastolic (congestive) heart failure: Secondary | ICD-10-CM | POA: Diagnosis not present

## 2020-03-27 DIAGNOSIS — Z7984 Long term (current) use of oral hypoglycemic drugs: Secondary | ICD-10-CM | POA: Insufficient documentation

## 2020-03-27 DIAGNOSIS — R29810 Facial weakness: Secondary | ICD-10-CM | POA: Diagnosis not present

## 2020-03-27 DIAGNOSIS — I161 Hypertensive emergency: Secondary | ICD-10-CM | POA: Diagnosis not present

## 2020-03-27 DIAGNOSIS — R531 Weakness: Secondary | ICD-10-CM

## 2020-03-27 DIAGNOSIS — I350 Nonrheumatic aortic (valve) stenosis: Secondary | ICD-10-CM | POA: Diagnosis not present

## 2020-03-27 DIAGNOSIS — M1990 Unspecified osteoarthritis, unspecified site: Secondary | ICD-10-CM | POA: Diagnosis not present

## 2020-03-27 DIAGNOSIS — R262 Difficulty in walking, not elsewhere classified: Secondary | ICD-10-CM | POA: Insufficient documentation

## 2020-03-27 DIAGNOSIS — J984 Other disorders of lung: Secondary | ICD-10-CM | POA: Diagnosis not present

## 2020-03-27 DIAGNOSIS — D631 Anemia in chronic kidney disease: Secondary | ICD-10-CM | POA: Insufficient documentation

## 2020-03-27 DIAGNOSIS — Z8249 Family history of ischemic heart disease and other diseases of the circulatory system: Secondary | ICD-10-CM | POA: Insufficient documentation

## 2020-03-27 DIAGNOSIS — G459 Transient cerebral ischemic attack, unspecified: Secondary | ICD-10-CM | POA: Diagnosis not present

## 2020-03-27 DIAGNOSIS — Z833 Family history of diabetes mellitus: Secondary | ICD-10-CM | POA: Insufficient documentation

## 2020-03-27 DIAGNOSIS — Z91041 Radiographic dye allergy status: Secondary | ICD-10-CM | POA: Insufficient documentation

## 2020-03-27 DIAGNOSIS — Z6837 Body mass index (BMI) 37.0-37.9, adult: Secondary | ICD-10-CM | POA: Diagnosis not present

## 2020-03-27 DIAGNOSIS — G4733 Obstructive sleep apnea (adult) (pediatric): Secondary | ICD-10-CM | POA: Diagnosis not present

## 2020-03-27 DIAGNOSIS — R011 Cardiac murmur, unspecified: Secondary | ICD-10-CM | POA: Insufficient documentation

## 2020-03-27 DIAGNOSIS — Z79899 Other long term (current) drug therapy: Secondary | ICD-10-CM | POA: Insufficient documentation

## 2020-03-27 DIAGNOSIS — J449 Chronic obstructive pulmonary disease, unspecified: Secondary | ICD-10-CM | POA: Diagnosis not present

## 2020-03-27 DIAGNOSIS — E1122 Type 2 diabetes mellitus with diabetic chronic kidney disease: Secondary | ICD-10-CM | POA: Diagnosis not present

## 2020-03-27 DIAGNOSIS — M542 Cervicalgia: Secondary | ICD-10-CM | POA: Diagnosis not present

## 2020-03-27 DIAGNOSIS — E1151 Type 2 diabetes mellitus with diabetic peripheral angiopathy without gangrene: Secondary | ICD-10-CM | POA: Diagnosis not present

## 2020-03-27 DIAGNOSIS — R519 Headache, unspecified: Secondary | ICD-10-CM | POA: Diagnosis not present

## 2020-03-27 DIAGNOSIS — I130 Hypertensive heart and chronic kidney disease with heart failure and stage 1 through stage 4 chronic kidney disease, or unspecified chronic kidney disease: Secondary | ICD-10-CM | POA: Diagnosis not present

## 2020-03-27 DIAGNOSIS — R2990 Unspecified symptoms and signs involving the nervous system: Secondary | ICD-10-CM

## 2020-03-27 DIAGNOSIS — I517 Cardiomegaly: Secondary | ICD-10-CM | POA: Diagnosis not present

## 2020-03-27 DIAGNOSIS — I674 Hypertensive encephalopathy: Secondary | ICD-10-CM | POA: Diagnosis present

## 2020-03-27 DIAGNOSIS — I959 Hypotension, unspecified: Secondary | ICD-10-CM | POA: Diagnosis not present

## 2020-03-27 DIAGNOSIS — N1832 Chronic kidney disease, stage 3b: Secondary | ICD-10-CM | POA: Diagnosis present

## 2020-03-27 DIAGNOSIS — Z888 Allergy status to other drugs, medicaments and biological substances status: Secondary | ICD-10-CM | POA: Insufficient documentation

## 2020-03-27 DIAGNOSIS — M109 Gout, unspecified: Secondary | ICD-10-CM | POA: Diagnosis not present

## 2020-03-27 DIAGNOSIS — I160 Hypertensive urgency: Secondary | ICD-10-CM | POA: Diagnosis not present

## 2020-03-27 DIAGNOSIS — G4489 Other headache syndrome: Secondary | ICD-10-CM | POA: Diagnosis not present

## 2020-03-27 LAB — COMPREHENSIVE METABOLIC PANEL
ALT: 15 U/L (ref 0–44)
AST: 17 U/L (ref 15–41)
Albumin: 3.3 g/dL — ABNORMAL LOW (ref 3.5–5.0)
Alkaline Phosphatase: 57 U/L (ref 38–126)
Anion gap: 10 (ref 5–15)
BUN: 26 mg/dL — ABNORMAL HIGH (ref 8–23)
CO2: 28 mmol/L (ref 22–32)
Calcium: 8.7 mg/dL — ABNORMAL LOW (ref 8.9–10.3)
Chloride: 101 mmol/L (ref 98–111)
Creatinine, Ser: 1.29 mg/dL — ABNORMAL HIGH (ref 0.44–1.00)
GFR calc Af Amer: 49 mL/min — ABNORMAL LOW (ref >60)
GFR calc non Af Amer: 42 mL/min — ABNORMAL LOW (ref >60)
Glucose, Bld: 114 mg/dL — ABNORMAL HIGH (ref 70–99)
Potassium: 3.5 mmol/L (ref 3.5–5.1)
Sodium: 139 mmol/L (ref 135–145)
Total Bilirubin: 0.5 mg/dL (ref 0.3–1.2)
Total Protein: 7 g/dL (ref 6.5–8.1)

## 2020-03-27 LAB — URINALYSIS, ROUTINE W REFLEX MICROSCOPIC
Bacteria, UA: NONE SEEN
Bilirubin Urine: NEGATIVE
Glucose, UA: NEGATIVE mg/dL
Ketones, ur: NEGATIVE mg/dL
Nitrite: NEGATIVE
Protein, ur: NEGATIVE mg/dL
Specific Gravity, Urine: 1.019 (ref 1.005–1.030)
pH: 5 (ref 5.0–8.0)

## 2020-03-27 LAB — CBC
HCT: 36.9 % (ref 36.0–46.0)
Hemoglobin: 11.7 g/dL — ABNORMAL LOW (ref 12.0–15.0)
MCH: 28.7 pg (ref 26.0–34.0)
MCHC: 31.7 g/dL (ref 30.0–36.0)
MCV: 90.7 fL (ref 80.0–100.0)
Platelets: 283 10*3/uL (ref 150–400)
RBC: 4.07 MIL/uL (ref 3.87–5.11)
RDW: 14.6 % (ref 11.5–15.5)
WBC: 7.9 10*3/uL (ref 4.0–10.5)
nRBC: 0 % (ref 0.0–0.2)

## 2020-03-27 LAB — PROTIME-INR
INR: 1 (ref 0.8–1.2)
Prothrombin Time: 12.8 seconds (ref 11.4–15.2)

## 2020-03-27 LAB — DIFFERENTIAL
Abs Immature Granulocytes: 0.03 10*3/uL (ref 0.00–0.07)
Basophils Absolute: 0.1 10*3/uL (ref 0.0–0.1)
Basophils Relative: 1 %
Eosinophils Absolute: 0.5 10*3/uL (ref 0.0–0.5)
Eosinophils Relative: 6 %
Immature Granulocytes: 0 %
Lymphocytes Relative: 35 %
Lymphs Abs: 2.7 10*3/uL (ref 0.7–4.0)
Monocytes Absolute: 0.8 10*3/uL (ref 0.1–1.0)
Monocytes Relative: 11 %
Neutro Abs: 3.8 10*3/uL (ref 1.7–7.7)
Neutrophils Relative %: 47 %

## 2020-03-27 LAB — APTT: aPTT: 26 seconds (ref 24–36)

## 2020-03-27 LAB — I-STAT CHEM 8, ED
BUN: 27 mg/dL — ABNORMAL HIGH (ref 8–23)
Calcium, Ion: 0.94 mmol/L — ABNORMAL LOW (ref 1.15–1.40)
Chloride: 102 mmol/L (ref 98–111)
Creatinine, Ser: 1.2 mg/dL — ABNORMAL HIGH (ref 0.44–1.00)
Glucose, Bld: 108 mg/dL — ABNORMAL HIGH (ref 70–99)
HCT: 36 % (ref 36.0–46.0)
Hemoglobin: 12.2 g/dL (ref 12.0–15.0)
Potassium: 3.4 mmol/L — ABNORMAL LOW (ref 3.5–5.1)
Sodium: 142 mmol/L (ref 135–145)
TCO2: 27 mmol/L (ref 22–32)

## 2020-03-27 LAB — CBG MONITORING, ED
Glucose-Capillary: 109 mg/dL — ABNORMAL HIGH (ref 70–99)
Glucose-Capillary: 113 mg/dL — ABNORMAL HIGH (ref 70–99)
Glucose-Capillary: 127 mg/dL — ABNORMAL HIGH (ref 70–99)

## 2020-03-27 LAB — TROPONIN I (HIGH SENSITIVITY)
Troponin I (High Sensitivity): 6 ng/L (ref <18)
Troponin I (High Sensitivity): 8 ng/L (ref <18)

## 2020-03-27 LAB — GLUCOSE, CAPILLARY: Glucose-Capillary: 169 mg/dL — ABNORMAL HIGH (ref 70–99)

## 2020-03-27 LAB — SARS CORONAVIRUS 2 BY RT PCR (HOSPITAL ORDER, PERFORMED IN ~~LOC~~ HOSPITAL LAB): SARS Coronavirus 2: NEGATIVE

## 2020-03-27 MED ORDER — BUTALBITAL-APAP-CAFFEINE 50-325-40 MG PO TABS
1.0000 | ORAL_TABLET | Freq: Four times a day (QID) | ORAL | Status: DC | PRN
  Administered 2020-03-27: 1 via ORAL

## 2020-03-27 MED ORDER — BUDESONIDE 0.5 MG/2ML IN SUSP
0.5000 mg | Freq: Two times a day (BID) | RESPIRATORY_TRACT | Status: DC
  Administered 2020-03-27 – 2020-03-28 (×2): 0.5 mg via RESPIRATORY_TRACT
  Filled 2020-03-27 (×5): qty 2

## 2020-03-27 MED ORDER — CLONIDINE HCL 0.2 MG PO TABS: 0.3000 mg | ORAL_TABLET | Freq: Two times a day (BID) | ORAL | Status: DC

## 2020-03-27 MED ORDER — ACETAMINOPHEN 650 MG RE SUPP: 650.0000 mg | RECTAL | Status: DC | PRN

## 2020-03-27 MED ORDER — ATORVASTATIN CALCIUM 80 MG PO TABS: 80.0000 mg | ORAL_TABLET | ORAL | Status: DC

## 2020-03-27 MED ORDER — SODIUM CHLORIDE 0.9% FLUSH
3.0000 mL | Freq: Once | INTRAVENOUS | Status: DC
Start: 2020-03-27 — End: 2020-03-28

## 2020-03-27 MED ORDER — ACETAMINOPHEN 500 MG PO TABS
1000.0000 mg | ORAL_TABLET | Freq: Once | ORAL | Status: AC
  Administered 2020-03-27: 1000 mg via ORAL
  Filled 2020-03-27: qty 2

## 2020-03-27 MED ORDER — HYDRALAZINE HCL 20 MG/ML IJ SOLN: 10.0000 mg | INTRAMUSCULAR | Status: DC | PRN

## 2020-03-27 MED ORDER — BUTALBITAL-APAP-CAFFEINE 50-325-40 MG PO TABS
1.0000 | ORAL_TABLET | Freq: Once | ORAL | Status: DC
  Filled 2020-03-27: qty 1

## 2020-03-27 MED ORDER — INSULIN ASPART 100 UNIT/ML ~~LOC~~ SOLN
0.0000 [IU] | Freq: Three times a day (TID) | SUBCUTANEOUS | Status: DC
  Administered 2020-03-27 – 2020-03-28 (×2): 1 [IU] via SUBCUTANEOUS

## 2020-03-27 MED ORDER — AMLODIPINE BESYLATE 10 MG PO TABS
10.0000 mg | ORAL_TABLET | Freq: Every day | ORAL | Status: DC
  Administered 2020-03-27 – 2020-03-28 (×2): 10 mg via ORAL
  Filled 2020-03-27: qty 2
  Filled 2020-03-27: qty 1
  Filled 2020-03-27: qty 2

## 2020-03-27 MED ORDER — POTASSIUM CHLORIDE CRYS ER 10 MEQ PO TBCR
20.0000 meq | EXTENDED_RELEASE_TABLET | Freq: Every day | ORAL | Status: DC
  Administered 2020-03-27 – 2020-03-28 (×2): 20 meq via ORAL
  Filled 2020-03-27 (×2): qty 2

## 2020-03-27 MED ORDER — CLOPIDOGREL BISULFATE 75 MG PO TABS
75.0000 mg | ORAL_TABLET | Freq: Every day | ORAL | Status: DC
  Administered 2020-03-27 – 2020-03-28 (×2): 75 mg via ORAL
  Filled 2020-03-27 (×3): qty 1

## 2020-03-27 MED ORDER — ACETAMINOPHEN 325 MG PO TABS
650.0000 mg | ORAL_TABLET | ORAL | Status: DC | PRN
  Administered 2020-03-27: 650 mg via ORAL
  Filled 2020-03-27: qty 2

## 2020-03-27 MED ORDER — ENOXAPARIN SODIUM 40 MG/0.4ML ~~LOC~~ SOLN
40.0000 mg | SUBCUTANEOUS | Status: DC
  Administered 2020-03-27 – 2020-03-28 (×2): 40 mg via SUBCUTANEOUS
  Filled 2020-03-27 (×4): qty 0.4

## 2020-03-27 MED ORDER — IPRATROPIUM-ALBUTEROL 0.5-2.5 (3) MG/3ML IN SOLN
3.0000 mL | RESPIRATORY_TRACT | Status: DC | PRN
  Filled 2020-03-27: qty 3

## 2020-03-27 MED ORDER — STROKE: EARLY STAGES OF RECOVERY BOOK: Freq: Once | Status: DC

## 2020-03-27 MED ORDER — IRBESARTAN 300 MG PO TABS
300.0000 mg | ORAL_TABLET | Freq: Every day | ORAL | Status: DC
  Administered 2020-03-27 – 2020-03-28 (×2): 300 mg via ORAL
  Filled 2020-03-27 (×2): qty 1

## 2020-03-27 MED ORDER — ACETAMINOPHEN 160 MG/5ML PO SOLN: 650.0000 mg | ORAL | Status: DC | PRN

## 2020-03-27 MED ORDER — CLONIDINE HCL 0.3 MG/24HR TD PTWK
0.3000 mg | MEDICATED_PATCH | TRANSDERMAL | Status: DC
  Administered 2020-03-27: 0.3 mg via TRANSDERMAL
  Filled 2020-03-27 (×3): qty 1

## 2020-03-27 MED ORDER — CARVEDILOL 6.25 MG PO TABS
6.2500 mg | ORAL_TABLET | Freq: Two times a day (BID) | ORAL | Status: DC
  Administered 2020-03-27 – 2020-03-28 (×3): 6.25 mg via ORAL
  Filled 2020-03-27 (×3): qty 1

## 2020-03-27 MED ORDER — ARFORMOTEROL TARTRATE 15 MCG/2ML IN NEBU
15.0000 ug | INHALATION_SOLUTION | Freq: Two times a day (BID) | RESPIRATORY_TRACT | Status: DC
  Administered 2020-03-27 – 2020-03-28 (×2): 15 ug via RESPIRATORY_TRACT
  Filled 2020-03-27 (×5): qty 2

## 2020-03-27 MED ORDER — FUROSEMIDE 40 MG PO TABS
40.0000 mg | ORAL_TABLET | Freq: Every day | ORAL | Status: DC
  Administered 2020-03-27 – 2020-03-28 (×2): 40 mg via ORAL
  Filled 2020-03-27: qty 1
  Filled 2020-03-27 (×2): qty 2

## 2020-03-27 NOTE — H&P (Addendum)
History and Physical    Sherry Fitzpatrick:[id number] DOB: 1951-06-20 DOA: 03/27/2020  Referring MD/NP/PA:  Inda Merlin, MD PCP: Glenis Smoker, MD  Patient coming from: Home via EMS  Chief Complaint: Headache, blurry vision, and difficulty walking  I have personally briefly reviewed patient's old medical records in [place]   HPI: Sherry Fitzpatrick is a 69 y.o. female with medical history significant of TIA/CVA, HTN, HLD, heart murmur, diabetes mellitus type 2, OSA not on CPAP, arthritis, and obesity who presents with complaints of having a severe headache, blurry vision, difficulty walking this morning around 4:30 AM. She was last seen normal by her daughter around 2 AM. She reported associa patients ted symptoms of epigastric pressure, palpitations, anxiety, and reports of a dry cough over the last several days. In route with EMS patient was noted to have some slurred speech with right-sided facial droop. Patient denied having any nausea, vomiting, diaphoresis, or dysuria, or chest pain.  Patient's daughter notes patient has had several TIAs and at least one stroke in the past.  She had previously had right-sided weakness with prior episodes.  At home her daughter monitors her medications and states that she has been taking them all as prescribed.  However, blood pressures are intermittently into the 200s.   ED Course: Patient presented as a code stroke by EMS.  She was seen by neurology and initial CT scan of the brain showed only chronic changes with no acute abnormality.  NIHSS score 0.  Patient was not a candidate for TPA as symptoms mostly had resolved.  Labs significant for hemoglobin 11.7. Chest x-ray significant for cardiomegaly without superimposed infection. Covid 19 screening negative.  Patient reports symptoms lasted approximately 1 to 2 hours before resolving.  Review of Systems  Constitutional: Negative for diaphoresis and fever.  HENT: Negative for nosebleeds.     Eyes: Positive for blurred vision. Negative for pain.  Respiratory: Positive for cough. Negative for shortness of breath.   Cardiovascular: Negative for chest pain, leg swelling and PND.  Gastrointestinal: Positive for abdominal pain. Negative for diarrhea, nausea and vomiting.  Genitourinary: Negative for dysuria and hematuria.  Musculoskeletal: Negative for joint pain and myalgias.  Skin: Negative for itching.  Neurological: Positive for speech change and headaches. Negative for loss of consciousness.  Psychiatric/Behavioral: Negative for substance abuse. The patient is nervous/anxious.     Past Medical History:  Diagnosis Date  . Alterations of sensations, late effect of cerebrovascular disease(438.6)   . Arthritis    "all over"  . Backache, unspecified   . Body mass index 40.0-44.9, adult ([place])   . Carpal tunnel syndrome   . Dizziness and giddiness   . Esophageal reflux   . Generalized pain   . Headache    "had alot til I had pituitary tumor removed"  . Heart murmur   . Other abnormal glucose   . Other and unspecified hyperlipidemia   . Other malaise and fatigue   . Other postprocedural status(V45.89)   . Other symptoms involving cardiovascular system   . Sleep apnea    "did test; they wanted me to get a CPAP but I never did get it" (04/14/2015) Does use inhaler at night (01 2021)  . Stroke ([place]) 06/2003; 02/2010   "minor; minor" ; denies residual on 04/14/2015  . Unspecified disorder of the pituitary gland and its hypothalamic control   . Unspecified essential hypertension   . Unspecified visual disturbance     Past Surgical History:  Procedure Laterality Date  . CARPAL TUNNEL RELEASE Left ~ 2014  . JOINT REPLACEMENT    . TOTAL HIP ARTHROPLASTY Right 11/06/2010  . TRANSPHENOIDAL / TRANSNASAL HYPOPHYSECTOMY / RESECTION PITUITARY TUMOR  06/13/2012  . TUBAL LIGATION  1974     reports that she has never smoked. She has never used smokeless tobacco. She reports current  alcohol use. She reports that she does not use drugs.  Allergies  Allergen Reactions  . Morphine And Related Hives and Nausea And Vomiting  . Penicillins Hives    Has patient had a PCN reaction causing immediate rash, facial/tongue/throat swelling, SOB or lightheadedness with hypotension: Yes Has patient had a PCN reaction causing severe rash involving mucus membranes or skin necrosis: No Has patient had a PCN reaction that required hospitalization: No Has patient had a PCN reaction occurring within the last 10 years: No If all of the above answers are "NO", then may proceed with Cephalosporin use.  Marland Kitchen Percocet [Oxycodone-Acetaminophen] Nausea And Vomiting  . Ace Inhibitors Nausea And Vomiting and Cough  . Iodinated Diagnostic Agents Nausea And Vomiting       . Iodine Itching  . Etodolac Nausea And Vomiting  . Tramadol Nausea And Vomiting  . Vicodin [Hydrocodone-Acetaminophen] Nausea And Vomiting    Family History  Problem Relation Age of Onset  . Heart failure Mother   . Hypertension Mother   . Diabetes Father   . Hypertension Sister   . Heart failure Sister   . Hypertension Daughter     Prior to Admission medications   Medication Sig Start Date End Date Taking? Authorizing Provider  acetaminophen (TYLENOL) 500 MG tablet Take 1,000 mg by mouth every 6 (six) hours as needed for headache (pain).   Yes [provider]  albuterol (PROVENTIL HFA;VENTOLIN HFA) 108 (90 Base) MCG/ACT inhaler Inhale 2 puffs into the lungs every 6 (six) hours as needed for wheezing. Patient taking differently: Inhale 2 puffs into the lungs every 6 (six) hours as needed for wheezing or shortness of breath.  02/24/18  Yes Danford, Suann Larry, MD  amLODipine (NORVASC) 10 MG tablet Take 1 tablet (10 mg total) by mouth daily. Patient taking differently: Take 10 mg by mouth daily at 2 PM.  05/07/19  Yes Adhikari, Amrit, MD  atorvastatin (LIPITOR) 80 MG tablet Take 1 tablet (80 mg total) by mouth  daily. Patient taking differently: Take 80 mg by mouth every Thursday.  02/24/18  Yes Danford, Suann Larry, MD  budesonide-formoterol (SYMBICORT) 160-4.5 MCG/ACT inhaler Inhale 2 puffs into the lungs 2 (two) times daily. 11/24/19  Yes Shelly Coss, MD  carvedilol (COREG) 6.25 MG tablet Take 6.25 mg by mouth 2 (two) times daily. 12/01/19  Yes [provider]  cloNIDine (CATAPRES) 0.3 MG tablet Take 1 tablet (0.3 mg total) by mouth 2 (two) times daily. Patient taking differently: Take 0.3 mg by mouth 2 (two) times daily. at 2pm and after supper 05/07/19  Yes Adhikari, Tamsen Meek, MD  clopidogrel (PLAVIX) 75 MG tablet Take 1 tablet (75 mg total) by mouth daily. 02/24/18  Yes Danford, Suann Larry, MD  furosemide (LASIX) 40 MG tablet Take 1 tablet (40 mg total) by mouth daily at 2 PM. Patient taking differently: Take 40 mg by mouth daily.  11/24/19 03/27/29 Yes Adhikari, Tamsen Meek, MD  ipratropium-albuterol (DUONEB) 0.5-2.5 (3) MG/3ML SOLN Take 3 mLs by nebulization every 4 (four) hours as needed. Patient taking differently: Take 3 mLs by nebulization every 4 (four) hours as needed (for breathing).  11/24/19  Yes Shelly Coss, MD  metFORMIN (GLUCOPHAGE) 500 MG tablet Take 1 tablet (500 mg total) by mouth 2 (two) times daily with a meal. 11/24/19 11/23/20 Yes Adhikari, Amrit, MD  potassium chloride (KLOR-CON) 10 MEQ tablet Take 2 tablets (20 mEq total) by mouth daily. Patient taking differently: Take 20 mEq by mouth daily at 2 PM.  07/16/19  Yes Marianna Payment, MD  valsartan (DIOVAN) 320 MG tablet Take 320 mg by mouth daily.  08/26/19  Yes [provider]  guaiFENesin (MUCINEX) 600 MG 12 hr tablet Take 2 tablets (1,200 mg total) by mouth 2 (two) times daily. Patient not taking: Reported on 03/27/2020 11/24/19   Shelly Coss, MD    Physical Exam:  Constitutional: Obese elderly female in no acute distress at this time Vitals:   03/27/20 0609 03/27/20 0615 03/27/20 0630 03/27/20 0735  BP:  (!)  178/70 (!) 152/95 (!) 201/89  Pulse:  64 62 60  Resp:  15 (!) 23 19  Temp:      TempSrc:      SpO2:  99% 99% 98%  Weight: 99.8 kg     Height: 5\' 4"  (1.626 m)      Eyes: PERRL, lids and conjunctivae normal ENMT: Mucous membranes are moist. Posterior pharynx clear of any exudate or lesions.  Neck: normal, supple, no masses, no thyromegaly Respiratory: clear to auscultation bilaterally, no wheezing, no crackles. Normal respiratory effort. No accessory muscle use.  Cardiovascular: Regular rate and rhythm, positive systolic murmur 2/6. No extremity edema. 2+ pedal pulses. No carotid bruits.  Abdomen: Mild suprapubic tenderness appreciated.  No masses palpated. No hepatosplenomegaly. Bowel sounds positive.  Musculoskeletal: no clubbing / cyanosis. No joint deformity upper and lower extremities. Good ROM, no contractures. Normal muscle tone.  Skin: Facial hair appreciated. Neurologic: CN 2-12 grossly intact. Sensation intact, DTR normal. Strength 5/5 in all 4.  Psychiatric: Normal judgment and insight. Alert and oriented x 3. Normal mood.     Labs on Admission: I have personally reviewed following labs and imaging studies  CBC: Recent Labs  Lab 03/27/20 0555 03/27/20 0602  WBC 7.9  --   NEUTROABS 3.8  --   HGB 11.7* 12.2  HCT 36.9 36.0  MCV 90.7  --   PLT 283  --    Basic Metabolic Panel: Recent Labs  Lab 03/27/20 0555 03/27/20 0602  NA 139 142  K 3.5 3.4*  CL 101 102  CO2 28  --   GLUCOSE 114* 108*  BUN 26* 27*  CREATININE 1.29* 1.20*  CALCIUM 8.7*  --    GFR: Estimated Creatinine Clearance: 50.1 mL/min (A) (by C-G formula based on SCr of 1.2 mg/dL (H)). Liver Function Tests: Recent Labs  Lab 03/27/20 0555  AST 17  ALT 15  ALKPHOS 57  BILITOT 0.5  PROT 7.0  ALBUMIN 3.3*   No results for input(s): LIPASE, AMYLASE in the last 168 hours. No results for input(s): AMMONIA in the last 168 hours. Coagulation Profile: Recent Labs  Lab 03/27/20 0555  INR 1.0    Cardiac Enzymes: No results for input(s): CKTOTAL, CKMB, CKMBINDEX, TROPONINI in the last 168 hours. BNP (last 3 results) No results for input(s): PROBNP in the last 8760 hours. HbA1C: No results for input(s): HGBA1C in the last 72 hours. CBG: Recent Labs  Lab 03/27/20 0542  GLUCAP 109*   Lipid Profile: No results for input(s): CHOL, HDL, LDLCALC, TRIG, CHOLHDL, LDLDIRECT in the last 72 hours. Thyroid Function Tests: No results for input(s): TSH,  T4TOTAL, FREET4, T3FREE, THYROIDAB in the last 72 hours. Anemia Panel: No results for input(s): VITAMINB12, FOLATE, FERRITIN, TIBC, IRON, RETICCTPCT in the last 72 hours. Urine analysis:    Component Value Date/Time   COLORURINE YELLOW 10/29/2019 1604   APPEARANCEUR HAZY (A) 10/29/2019 1604   LABSPEC 1.018 10/29/2019 1604   PHURINE 7.0 10/29/2019 1604   GLUCOSEU NEGATIVE 10/29/2019 1604   HGBUR NEGATIVE 10/29/2019 [place] 10/29/2019 [place] 10/29/2019 1604   PROTEINUR 30 (A) 10/29/2019 1604   UROBILINOGEN 0.2 04/14/2015 1451   NITRITE NEGATIVE 10/29/2019 1604   LEUKOCYTESUR NEGATIVE 10/29/2019 1604   Sepsis Labs: No results found for this or any previous visit (from the past 240 hour(s)).   Radiological Exams on Admission: CT HEAD CODE STROKE WO CONTRAST  Result Date: 03/27/2020 CLINICAL DATA:  Code stroke.  Blurry vision EXAM: CT HEAD WITHOUT CONTRAST TECHNIQUE: Contiguous axial images were obtained from the base of the skull through the vertex without intravenous contrast. COMPARISON:  Head CT 05/05/2019 FINDINGS: Brain: There is no mass, hemorrhage or extra-axial collection. The size and configuration of the ventricles and extra-axial CSF spaces are normal. There is hypoattenuation of the periventricular white matter, most commonly indicating chronic ischemic microangiopathy. Vascular: No abnormal hyperdensity of the major intracranial arteries or dural venous sinuses. No intracranial  atherosclerosis. Skull: The visualized skull base, calvarium and extracranial soft tissues are normal. Sinuses/Orbits: No fluid levels or advanced mucosal thickening of the visualized paranasal sinuses. No mastoid or middle ear effusion. The orbits are normal. ASPECTS [facility] Stroke Program Early CT Score) - Ganglionic level infarction (caudate, lentiform nuclei, internal capsule, insula, M1-M3 cortex): 7 - Supraganglionic infarction (M4-M6 cortex): 3 Total score (0-10 with 10 being normal): 10 IMPRESSION: 1. Chronic ischemic microangiopathy without acute intracranial abnormality. 2. ASPECTS is 10. These results were communicated to Dr. Karena Addison Aroor at 6:01 am on 03/27/2020 by text page via the [facility] messaging system. Electronically Signed   By: Ulyses Jarred M.D.   On: 03/27/2020 06:01    EKG: Independently reviewed.  Sinus rhythm at 65 bpm  Assessment/Plan Stroke-like symptoms: Acute.  Patient presented with right-sided facial weakness, slurred speech, gait disturbance, headache, and blurry vision after waking up around 4:30 AM this morning.  Last seen normal around 2 AM.  Blood pressures were noted to be greater than 180.  Risk factors include prior CVA, HTN, DM type II, and HLD.  MRI of the brain ultimately did not find any acute abnormalities.  Differential includes TIA/stroke vs. hypertensive emergency vs. complex migraine. -Admit to telemetry bed -Stroke order set utilized -Neuro checks -Check  MRI -MRA neck w w/o contrast unable to be performed in carotid Doppler ultrasound ordered instead -Goal blood pressure less than 160 -Check echocardiogram -Check Hemoglobin A1c and lipid panel in a.m. -Continue Plavix -Appreciate neurology consultative services, we will follow-up for any additional recommendations  Hypertensive emergency/malignant hypertension: Acute.  Systolic blood pressures elevated into the 200s.  Home medications include amlodipine 10 mg daily, Coreg 6.25 mg twice daily,  clonidine 0.3 mg twice daily, furosemide 40 mg daily, and valsartan 320 mg daily.  Patient's daughter reports compliance with these medications, but blood pressures still intermittently in the 200s.  Suspect patient's blood pressures as likely cause for headache and initial presenting symptoms. -Goal blood pressure less than 160 -Continue home regimen, but switch clonidine to transdermal patch to see if better control of patient's blood pressure can be obtained -Advised daughter about [email or web address] coupons  which would bring cost to around $37 at [facility]    Suprapubic tenderness: Patient denies any dysuria symptoms.  However on physical exam was noted to have some suprapubic tenderness. -Check urinalysis  History of CVA/TIA: Patient with prior history of previous TIA/strokes. -Continue Plavix and statin  Diastolic congestive heart failure: Patient appears euvolemic at this time. Last echocardiogram in 04/2019 noted EF of 55 to 60% with mild pulmonic and mitral valve regurgitation. -Continue to monitor  Diabetes mellitus type 2: On admission glucose mildly elevated at 114.  Last hemoglobin A1c noted to be 7.1 on 11/16/2019. -Hypoglycemic protocol -Hold Metformin -CBGs before every meal with sensitive SSI -Adjust regimen as needed  COPD, without acute exacerbation: Complains of having a cough over last several days.  Chest x-ray otherwise clear and no wheezing found on physical exam.  Home medications include Symbicort and albuterol. -Follow-up chest x-ray -DuoNebs as needed -Substituting budesonide and Brovana nebs for Symbicort  Normocytic anemia: Stable.  Hemoglobin 11.7 g/dL with MCV, MCH, and RDW within normal limits.   -Continue to monitor  Chronic kidney disease stage IIIb: Creatinine 1.29 with BUN 26 which appears near patient's baseline. -Continue to monitor kidney function  Hyperlipidemia: Patient currently taking atorvastatin 80 mg q. Thursday. -Follow-up lipid  panel -Continue current regimen of atorvastatin  Obesity: BMI 37.76 kg/m: -Will need continued education on dietary and lifestyle modifications   DVT prophylaxis: Lovenox Code Status: Full Family Communication: Daughter Sherry Fitzpatrick updated over the phone Disposition Plan: Likely discharge home 1 day of work-up unremarkable Consults called: Neurology Admission status: Observation  Norval Morton MD [facility] Pager [phone]   If 7PM-7AM, please contact night-coverage [email or web address] Password [facility]  03/27/2020, 7:47 AM

## 2020-03-27 NOTE — Care Management (Signed)
Eligibility sent for clonidine patch 0.3 mg

## 2020-03-27 NOTE — ED Provider Notes (Signed)
[facility] EMERGENCY DEPARTMENT Provider Note   CSN: [id number] Arrival date & time: 03/27/20  0545  An emergency department physician performed an initial assessment on this suspected stroke patient at 0545.  History Chief Complaint  Patient presents with  . Code Stroke    Sherry Fitzpatrick is a 68 y.o. female.  Level 5 caveat for acuity of condition.  Patient arrives via EMS as a code stroke.  The patient went to bed about 2 AM and woke up about 430 with severe headache as well as right-sided facial droop.  She also had some difficulty moving her right leg and problems with balance per EMS.  On arrival her symptoms are starting to improve and no facial droop is apparent.  She feels like her right leg strength is improved.  She complains of severe headache.  States she been having headache on and off for several days but did not happen when she went to bed at 2 AM.  Headache was severe at 430 when she awoke.  She denies any difficulty speaking or difficulty swallowing.  No focal weakness, numbness or tingling currently.  No chest pain or shortness of breath.  Complains of nausea but no abdominal pain.  History includes previous stroke without residual deficits, COPD, CKD, hypertension, diastolic CHF.  The history is provided by the patient and the EMS personnel. The history is limited by the condition of the patient.       Past Medical History:  Diagnosis Date  . Alterations of sensations, late effect of cerebrovascular disease(438.6)   . Arthritis    "all over"  . Backache, unspecified   . Body mass index 40.0-44.9, adult ([place])   . Carpal tunnel syndrome   . Dizziness and giddiness   . Esophageal reflux   . Generalized pain   . Headache    "had alot til I had pituitary tumor removed"  . Heart murmur   . Other abnormal glucose   . Other and unspecified hyperlipidemia   . Other malaise and fatigue   . Other postprocedural status(V45.89)   . Other symptoms  involving cardiovascular system   . Sleep apnea    "did test; they wanted me to get a CPAP but I never did get it" (04/14/2015) Does use inhaler at night (01 2021)  . Stroke ([place]) 06/2003; 02/2010   "minor; minor" ; denies residual on 04/14/2015  . Unspecified disorder of the pituitary gland and its hypothalamic control   . Unspecified essential hypertension   . Unspecified visual disturbance     Patient Active Problem List   Diagnosis Date Noted  . Acute respiratory failure with hypoxia ([place]) 11/22/2019  . COPD with acute exacerbation ([place]) 11/22/2019  . Acute asthma exacerbation 11/22/2019  . Morbid obesity ([place]) 10/29/2019  . CKD (chronic kidney disease), stage III 10/29/2019  . Reactive airway disease without complication   . Dyspnea 07/13/2019  . Acute ischemic stroke ([place]) 05/05/2019  . AKI (acute kidney injury) ([place]) 07/03/2018  . Hypertensive crisis 07/01/2018  . Tobacco abuse 07/01/2018  . Hypokalemia 07/01/2018  . Chronic diastolic CHF (congestive heart failure) ([place]) 07/01/2018  . Positive D dimer   . Hypertensive urgency 02/22/2018  . Malignant hypertension 02/20/2018  . Shortness of breath 02/20/2018  . Stroke ([place])   . Chest pain 04/14/2015  . Right sided weakness 04/14/2015  . Hyperlipidemia   . Body mass index 40.0-44.9, adult ([place])   . Essential hypertension   . Blood glucose elevated   .  Asthma   . OSA (obstructive sleep apnea) 05/06/2013    Past Surgical History:  Procedure Laterality Date  . CARPAL TUNNEL RELEASE Left ~ 2014  . JOINT REPLACEMENT    . TOTAL HIP ARTHROPLASTY Right 11/06/2010  . TRANSPHENOIDAL / TRANSNASAL HYPOPHYSECTOMY / RESECTION PITUITARY TUMOR  06/13/2012  . TUBAL LIGATION  1974     OB History   No obstetric history on file.     Family History  Problem Relation Age of Onset  . Heart failure Mother   . Hypertension Mother   . Diabetes Father   . Hypertension Sister   . Heart failure Sister   . Hypertension Daughter      Social History   Tobacco Use  . Smoking status: Never Smoker  . Smokeless tobacco: Never Used  Vaping Use  . Vaping Use: Never used  Substance Use Topics  . Alcohol use: Yes    Alcohol/week: 0.0 standard drinks    Comment: rarely; "I don't drink.  But I like it."  . Drug use: No    Home Medications Prior to Admission medications   Medication Sig Start Date End Date Taking? Authorizing Provider  acetaminophen (TYLENOL) 500 MG tablet Take 1,000 mg by mouth every 6 (six) hours as needed for headache (pain).    [provider]  albuterol (PROVENTIL HFA;VENTOLIN HFA) 108 (90 Base) MCG/ACT inhaler Inhale 2 puffs into the lungs every 6 (six) hours as needed for wheezing. Patient taking differently: Inhale 2 puffs into the lungs every 6 (six) hours as needed for wheezing or shortness of breath.  02/24/18   Danford, Suann Larry, MD  amLODipine (NORVASC) 10 MG tablet Take 1 tablet (10 mg total) by mouth daily. Patient taking differently: Take 10 mg by mouth daily at 2 PM.  05/07/19   Shelly Coss, MD  atorvastatin (LIPITOR) 80 MG tablet Take 1 tablet (80 mg total) by mouth daily. Patient taking differently: Take 80 mg by mouth once a week.  02/24/18   Danford, Suann Larry, MD  budesonide-formoterol (SYMBICORT) 160-4.5 MCG/ACT inhaler Inhale 2 puffs into the lungs 2 (two) times daily. 11/24/19   Shelly Coss, MD  cloNIDine (CATAPRES) 0.3 MG tablet Take 1 tablet (0.3 mg total) by mouth 2 (two) times daily. Patient taking differently: Take 0.3 mg by mouth See admin instructions. Take one tablet (0.3 mg) by mouth twice daily - at 2pm and after supper 05/07/19   Shelly Coss, MD  clopidogrel (PLAVIX) 75 MG tablet Take 1 tablet (75 mg total) by mouth daily. Patient not taking: Reported on 11/22/2019 02/24/18   Edwin Dada, MD  furosemide (LASIX) 40 MG tablet Take 1 tablet (40 mg total) by mouth daily at 2 PM. 11/24/19 12/24/19  Shelly Coss, MD  guaiFENesin (MUCINEX) 600  MG 12 hr tablet Take 2 tablets (1,200 mg total) by mouth 2 (two) times daily. 11/24/19   Shelly Coss, MD  ipratropium-albuterol (DUONEB) 0.5-2.5 (3) MG/3ML SOLN Take 3 mLs by nebulization every 4 (four) hours as needed. 11/24/19   Shelly Coss, MD  metFORMIN (GLUCOPHAGE) 500 MG tablet Take 1 tablet (500 mg total) by mouth 2 (two) times daily with a meal. 11/24/19 11/23/20  Shelly Coss, MD  metoprolol succinate (TOPROL-XL) 25 MG 24 hr tablet Take 25 mg by mouth daily at 2 PM.    [provider]  potassium chloride (KLOR-CON) 10 MEQ tablet Take 2 tablets (20 mEq total) by mouth daily. Patient taking differently: Take 20 mEq by mouth daily at 2  PM.  07/16/19   Marianna Payment, MD  valsartan (DIOVAN) 320 MG tablet Take 320 mg by mouth daily at 2 PM.  08/26/19   [provider]    Allergies    Morphine and related, Penicillins, Percocet [oxycodone-acetaminophen], Ace inhibitors, Iodinated diagnostic agents, Iodine, Etodolac, Tramadol, and Vicodin [hydrocodone-acetaminophen]  Review of Systems   Review of Systems  Constitutional: Negative for activity change, appetite change, fatigue and fever.  HENT: Negative for congestion and rhinorrhea.   Eyes: Negative for visual disturbance.  Respiratory: Negative for cough, chest tightness and shortness of breath.   Cardiovascular: Negative for chest pain and leg swelling.  Gastrointestinal: Positive for nausea. Negative for vomiting.  Genitourinary: Negative for dysuria, hematuria and vaginal bleeding.  Musculoskeletal: Negative for arthralgias and myalgias.  Neurological: Positive for facial asymmetry, weakness, numbness and headaches.   all other systems are negative except as noted in the HPI and PMH.    Physical Exam Updated Vital Signs BP (!) 175/81 (BP Location: Right Arm)   Pulse 64   Temp 98 F (36.7 C) (Oral)   Resp 18   Ht 5\' 4"  (1.626 m)   Wt 99.8 kg   SpO2 98%   BMI 37.76 kg/m   Physical Exam Vitals and nursing  note reviewed.  Constitutional:      General: She is not in acute distress.    Appearance: She is well-developed. She is obese.  HENT:     Head: Normocephalic and atraumatic.     Mouth/Throat:     Pharynx: No oropharyngeal exudate.  Eyes:     Conjunctiva/sclera: Conjunctivae normal.     Pupils: Pupils are equal, round, and reactive to light.  Neck:     Comments: No meningismus. Cardiovascular:     Rate and Rhythm: Normal rate and regular rhythm.     Heart sounds: Normal heart sounds. No murmur heard.   Pulmonary:     Effort: Pulmonary effort is normal. No respiratory distress.     Breath sounds: Normal breath sounds.  Abdominal:     Palpations: Abdomen is soft.     Tenderness: There is no abdominal tenderness. There is no guarding or rebound.  Musculoskeletal:        General: No tenderness. Normal range of motion.     Cervical back: Normal range of motion and neck supple.  Skin:    General: Skin is warm.  Neurological:     Mental Status: She is alert and oriented to person, place, and time.     Cranial Nerves: No cranial nerve deficit.     Motor: No abnormal muscle tone.     Coordination: Coordination normal.     Comments: Somewhat slow to respond but speech is normal without dysarthria.  Cranial nerves II to XII intact, no facial asymmetry.  Tongue is midline.  No pronator drift.  No ataxia finger-to-nose.  Able to raise arms and legs off bed against gravity with equal strength.  5/5 strength throughout.  Psychiatric:        Behavior: Behavior normal.     ED Results / Procedures / Treatments   Labs (all labs ordered are listed, but only abnormal results are displayed) Labs Reviewed  CBC - Abnormal; Notable for the following components:      Result Value   Hemoglobin 11.7 (*)    All other components within normal limits  COMPREHENSIVE METABOLIC PANEL - Abnormal; Notable for the following components:   Glucose, Bld 114 (*)    BUN 26 (*)  Creatinine, Ser 1.29 (*)     Calcium 8.7 (*)    Albumin 3.3 (*)    GFR calc non Af Amer 42 (*)    GFR calc Af Amer 49 (*)    All other components within normal limits  CBG MONITORING, ED - Abnormal; Notable for the following components:   Glucose-Capillary 109 (*)    All other components within normal limits  I-STAT CHEM 8, ED - Abnormal; Notable for the following components:   Potassium 3.4 (*)    BUN 27 (*)    Creatinine, Ser 1.20 (*)    Glucose, Bld 108 (*)    Calcium, Ion 0.94 (*)    All other components within normal limits  SARS CORONAVIRUS 2 BY RT PCR (HOSPITAL ORDER, [facility])  PROTIME-INR  APTT  DIFFERENTIAL  CBG MONITORING, ED  TROPONIN I (HIGH SENSITIVITY)    EKG EKG Interpretation  Date/Time:  Sunday March 27 2020 06:01:18 EDT Ventricular Rate:  65 PR Interval:    QRS Duration: 95 QT Interval:  414 QTC Calculation: 431 R Axis:   67 Text Interpretation: Sinus rhythm Abnormal T, consider ischemia, diffuse leads No significant change was found Confirmed by Ezequiel Essex [phone]) on 03/27/2020 6:15:18 AM   Radiology CT HEAD CODE STROKE WO CONTRAST  Result Date: 03/27/2020 CLINICAL DATA:  Code stroke.  Blurry vision EXAM: CT HEAD WITHOUT CONTRAST TECHNIQUE: Contiguous axial images were obtained from the base of the skull through the vertex without intravenous contrast. COMPARISON:  Head CT 05/05/2019 FINDINGS: Brain: There is no mass, hemorrhage or extra-axial collection. The size and configuration of the ventricles and extra-axial CSF spaces are normal. There is hypoattenuation of the periventricular white matter, most commonly indicating chronic ischemic microangiopathy. Vascular: No abnormal hyperdensity of the major intracranial arteries or dural venous sinuses. No intracranial atherosclerosis. Skull: The visualized skull base, calvarium and extracranial soft tissues are normal. Sinuses/Orbits: No fluid levels or advanced mucosal thickening of the visualized  paranasal sinuses. No mastoid or middle ear effusion. The orbits are normal. ASPECTS [facility] Stroke Program Early CT Score) - Ganglionic level infarction (caudate, lentiform nuclei, internal capsule, insula, M1-M3 cortex): 7 - Supraganglionic infarction (M4-M6 cortex): 3 Total score (0-10 with 10 being normal): 10 IMPRESSION: 1. Chronic ischemic microangiopathy without acute intracranial abnormality. 2. ASPECTS is 10. These results were communicated to Dr. Karena Addison Aroor at 6:01 am on 03/27/2020 by text page via the [facility] messaging system. Electronically Signed   By: Ulyses Jarred M.D.   On: 03/27/2020 06:01    Procedures Procedures (including critical care time)  Medications Ordered in ED Medications  sodium chloride flush (NS) 0.9 % injection 3 mL (has no administration in time range)    ED Course  I have reviewed the triage vital signs and the nursing notes.  Pertinent labs & imaging results that were available during my care of the patient were reviewed by me and considered in my medical decision making (see chart for details).    MDM Rules/Calculators/A&P                          Patient arrives as a code stroke with severe headache and right-sided facial droop that onset about 4:30 AM.  Last seen normal at 2 AM.  Seen with neurology team Dr. Lorraine Lax arrival.  CT head is negative for hemorrhage.  Symptoms have improved since arrival in the ED.  Her right leg strength is normal  and her facial droop has resolved. Dr. Lorraine Lax favors TIA versus hypertensive emergency.  NIH on arrival to the ED room is 0.  Headache has improved.  CT performed within 6 hours of sudden onset headache is negative for subarachnoid hemorrhage and effectively rules this out.  Dr. Lorraine Lax favors hypertensive emergency versus TIA and recommends medical admission for stroke work-up.  Labs are reassuring.  Headache is improving with Tylenol.  Blood pressure 152/95.  Observation admission discussed with Dr. Cyd Silence.    Final Clinical Impression(s) / ED Diagnoses Final diagnoses:  TIA (transient ischemic attack)  Hypertensive urgency    Rx / DC Orders ED Discharge Orders    None       [place], Annie Main, MD 03/27/20 [phone]

## 2020-03-27 NOTE — Consult Note (Signed)
Requesting Physician: Dr. Wyvonnia Dusky    Chief Complaint: Headache, slurred speech and right facial droop  History obtained from: Patient and Chart    HPI:                                                                                                                                       Sherry Fitzpatrick is a 69 y.o. female with past medical history significant for prior CVA/TIA, hypertension, carpal tunnel syndrome, obesity, sleep apnea presents to the emergency department with severe headache on waking up this morning at 4:30 AM and was brought in as a code stroke due to slurred speech, gait imbalance and facial droop.  Last known normal was 2 AM-seen by daughter and was her normal self.  Was complaining of mild headache went to sleep.  Stated that she had fried chicken for dinner.  Woke up this morning at 4:30 AM with severe headache and blurry vision in both eyes and so EMS was called.  No focal deficits noted by EMS-while transporting to an ambulance they noticed mild right facial droop and slurred speech.  BP is greater than 448 systolic, CBG 185  On arrival to [facility], ER: Stat CT head was obtained which showed no hemorrhage.  On assessment patient had no focal deficits was still complaining of a headache.  Date last known well: 6 13-21 Time last known well: 2 AM tPA Given: No, symptoms mostly resolved NIHSS: 0 Baseline MRS 0   Past Medical History:  Diagnosis Date  . Alterations of sensations, late effect of cerebrovascular disease(438.6)   . Arthritis    "all over"  . Backache, unspecified   . Body mass index 40.0-44.9, adult ([place])   . Carpal tunnel syndrome   . Dizziness and giddiness   . Esophageal reflux   . Generalized pain   . Headache    "had alot til I had pituitary tumor removed"  . Heart murmur   . Other abnormal glucose   . Other and unspecified hyperlipidemia   . Other malaise and fatigue   . Other postprocedural status(V45.89)   . Other symptoms involving  cardiovascular system   . Sleep apnea    "did test; they wanted me to get a CPAP but I never did get it" (04/14/2015) Does use inhaler at night (01 2021)  . Stroke ([place]) 06/2003; 02/2010   "minor; minor" ; denies residual on 04/14/2015  . Unspecified disorder of the pituitary gland and its hypothalamic control   . Unspecified essential hypertension   . Unspecified visual disturbance     Past Surgical History:  Procedure Laterality Date  . CARPAL TUNNEL RELEASE Left ~ 2014  . JOINT REPLACEMENT    . TOTAL HIP ARTHROPLASTY Right 11/06/2010  . TRANSPHENOIDAL / TRANSNASAL HYPOPHYSECTOMY / RESECTION PITUITARY TUMOR  06/13/2012  . TUBAL LIGATION  1974    Family History  Problem Relation Age of  Onset  . Heart failure Mother   . Hypertension Mother   . Diabetes Father   . Hypertension Sister   . Heart failure Sister   . Hypertension Daughter    Social History:  reports that she has never smoked. She has never used smokeless tobacco. She reports current alcohol use. She reports that she does not use drugs.  Allergies:  Allergies  Allergen Reactions  . Morphine And Related Hives and Nausea And Vomiting  . Penicillins Hives    Has patient had a PCN reaction causing immediate rash, facial/tongue/throat swelling, SOB or lightheadedness with hypotension: Yes Has patient had a PCN reaction causing severe rash involving mucus membranes or skin necrosis: No Has patient had a PCN reaction that required hospitalization: No Has patient had a PCN reaction occurring within the last 10 years: No If all of the above answers are "NO", then may proceed with Cephalosporin use.  Marland Kitchen Percocet [Oxycodone-Acetaminophen] Nausea And Vomiting  . Ace Inhibitors Nausea And Vomiting and Cough  . Iodinated Diagnostic Agents Nausea And Vomiting       . Iodine Itching  . Etodolac Nausea And Vomiting  . Tramadol Nausea And Vomiting  . Vicodin [Hydrocodone-Acetaminophen] Nausea And Vomiting    Medications:                                                                                                                         I reviewed home medications   ROS:                                                                                                                                     14 systems reviewed and negative except above    Examination:                                                                                                      General: Appears well-developed  Psych: appears in distress Eyes: No scleral injection HENT: No OP obstrucion  Head: Normocephalic.  Cardiovascular: Normal rate and regular rhythm. Respiratory: Effort normal and breath sounds normal to anterior ascultation GI: Soft.  No distension. There is no tenderness.  Skin: WDI    Neurological Examination Mental Status: Alert, oriented, thought content appropriate.  Speech fluent without evidence of aphasia. Able to follow 3 step commands without difficulty. Cranial Nerves: II: Visual fields grossly normal,  III,IV, VI: ptosis not present, extra-ocular motions intact bilaterally, pupils equal, round, reactive to light and accommodation V,VII: smile symmetric, facial light touch sensation normal bilaterally VIII: hearing normal bilaterally IX,X: uvula rises symmetrically XI: bilateral shoulder shrug XII: midline tongue extension Motor: Right : Upper extremity   5/5    Left:     Upper extremity   5/5  Lower extremity   5/5     Lower extremity   5/5 Tone and bulk:normal tone throughout; no atrophy noted Sensory: Pinprick and light touch intact throughout, bilaterally Plantars: Right: downgoing   Left: downgoing Cerebellar: normal finger-to-nose, normal rapid alternating movements and normal heel-to-shin test      Lab Results: Basic Metabolic Panel: Recent Labs  Lab 03/27/20 0602  NA 142  K 3.4*  CL 102  GLUCOSE 108*  BUN 27*  CREATININE 1.20*    CBC: Recent Labs  Lab 03/27/20 0602  HGB 12.2   HCT 36.0    Coagulation Studies: No results for input(s): LABPROT, INR in the last 72 hours.  Imaging: CT HEAD CODE STROKE WO CONTRAST  Result Date: 03/27/2020 CLINICAL DATA:  Code stroke.  Blurry vision EXAM: CT HEAD WITHOUT CONTRAST TECHNIQUE: Contiguous axial images were obtained from the base of the skull through the vertex without intravenous contrast. COMPARISON:  Head CT 05/05/2019 FINDINGS: Brain: There is no mass, hemorrhage or extra-axial collection. The size and configuration of the ventricles and extra-axial CSF spaces are normal. There is hypoattenuation of the periventricular white matter, most commonly indicating chronic ischemic microangiopathy. Vascular: No abnormal hyperdensity of the major intracranial arteries or dural venous sinuses. No intracranial atherosclerosis. Skull: The visualized skull base, calvarium and extracranial soft tissues are normal. Sinuses/Orbits: No fluid levels or advanced mucosal thickening of the visualized paranasal sinuses. No mastoid or middle ear effusion. The orbits are normal. ASPECTS [facility] Stroke Program Early CT Score) - Ganglionic level infarction (caudate, lentiform nuclei, internal capsule, insula, M1-M3 cortex): 7 - Supraganglionic infarction (M4-M6 cortex): 3 Total score (0-10 with 10 being normal): 10 IMPRESSION: 1. Chronic ischemic microangiopathy without acute intracranial abnormality. 2. ASPECTS is 10. These results were communicated to Dr. Karena Addison Esmee Fallaw at 6:01 am on 03/27/2020 by text page via the [facility] messaging system. Electronically Signed   By: Ulyses Jarred M.D.   On: 03/27/2020 06:01     ASSESSMENT AND PLAN   69 y.o. female with past medical history significant for prior CVA/TIA, hypertension, carpal tunnel syndrome, obesity, sleep apnea presents to the emergency department with severe headache on waking up this morning at 4:30 AM and was brought in as a code stroke due to slurred speech, gait imbalance and facial  droop.    Transient ischemic attack versus hypertensive emergency   Recommendations Acute Ischemic Stroke   Recommendations  # MRI of the brain without contrast #MRA Head and neck  #Transthoracic Echo  # Start patient on ASA 81mg  daily #Start or continue Atorvastatin 80 mg/other high intensity statin # BP goal: Gradually 211 systolic # HBAIC and Lipid profile # Telemetry monitoring # Frequent neuro checks # stroke swallow screen  Please page stroke  NP  Or  PA  Or MD from 8am -4 pm  as this patient from this time will be  followed by the stroke.   You can look them up on [email or web address]  Password [facility]    Karess Harner [facility] Pager Number [phone]

## 2020-03-27 NOTE — ED Triage Notes (Signed)
   Patient BIB EMS as code stroke.  LKN at 10 by daughter.  Patient woke up at 0430 with severe frontal headache and R sided facial droop per EMS.  Patient hx of TIA and stroke.  Patient A&O 4.  Initial NIH 1. CBG 109

## 2020-03-28 ENCOUNTER — Observation Stay (HOSPITAL_BASED_OUTPATIENT_CLINIC_OR_DEPARTMENT_OTHER): Payer: Medicare HMO

## 2020-03-28 ENCOUNTER — Observation Stay (HOSPITAL_COMMUNITY): Payer: Medicare HMO

## 2020-03-28 DIAGNOSIS — M10041 Idiopathic gout, right hand: Secondary | ICD-10-CM | POA: Diagnosis not present

## 2020-03-28 DIAGNOSIS — Z8673 Personal history of transient ischemic attack (TIA), and cerebral infarction without residual deficits: Secondary | ICD-10-CM

## 2020-03-28 DIAGNOSIS — I674 Hypertensive encephalopathy: Secondary | ICD-10-CM | POA: Diagnosis not present

## 2020-03-28 DIAGNOSIS — M79641 Pain in right hand: Secondary | ICD-10-CM | POA: Diagnosis not present

## 2020-03-28 DIAGNOSIS — E7800 Pure hypercholesterolemia, unspecified: Secondary | ICD-10-CM | POA: Diagnosis not present

## 2020-03-28 DIAGNOSIS — G459 Transient cerebral ischemic attack, unspecified: Secondary | ICD-10-CM | POA: Diagnosis not present

## 2020-03-28 DIAGNOSIS — I161 Hypertensive emergency: Secondary | ICD-10-CM | POA: Diagnosis not present

## 2020-03-28 DIAGNOSIS — I350 Nonrheumatic aortic (valve) stenosis: Secondary | ICD-10-CM

## 2020-03-28 DIAGNOSIS — D649 Anemia, unspecified: Secondary | ICD-10-CM | POA: Diagnosis not present

## 2020-03-28 DIAGNOSIS — N1832 Chronic kidney disease, stage 3b: Secondary | ICD-10-CM | POA: Diagnosis not present

## 2020-03-28 DIAGNOSIS — M109 Gout, unspecified: Secondary | ICD-10-CM | POA: Diagnosis not present

## 2020-03-28 DIAGNOSIS — R2990 Unspecified symptoms and signs involving the nervous system: Secondary | ICD-10-CM | POA: Diagnosis not present

## 2020-03-28 HISTORY — DX: Hypertensive encephalopathy: I67.4

## 2020-03-28 HISTORY — DX: Gout, unspecified: M10.9

## 2020-03-28 LAB — HEMOGLOBIN A1C
Hgb A1c MFr Bld: 7.1 % — ABNORMAL HIGH (ref 4.8–5.6)
Mean Plasma Glucose: 157.07 mg/dL

## 2020-03-28 LAB — LIPID PANEL
Cholesterol: 174 mg/dL (ref 0–200)
HDL: 32 mg/dL — ABNORMAL LOW (ref >40)
LDL Cholesterol: 106 mg/dL — ABNORMAL HIGH (ref 0–99)
Total CHOL/HDL Ratio: 5.4 RATIO
Triglycerides: 182 mg/dL — ABNORMAL HIGH (ref <150)
VLDL: 36 mg/dL (ref 0–40)

## 2020-03-28 LAB — ECHOCARDIOGRAM COMPLETE
Height: 64 in
Weight: 3520 oz

## 2020-03-28 LAB — GLUCOSE, CAPILLARY
Glucose-Capillary: 136 mg/dL — ABNORMAL HIGH (ref 70–99)
Glucose-Capillary: 120 mg/dL — ABNORMAL HIGH (ref 70–99)
Glucose-Capillary: 94 mg/dL (ref 70–99)

## 2020-03-28 MED ORDER — COLCHICINE 0.6 MG PO TABS
1.2000 mg | ORAL_TABLET | Freq: Once | ORAL | Status: AC
  Administered 2020-03-28: 1.2 mg via ORAL
  Filled 2020-03-28: qty 2

## 2020-03-28 MED ORDER — COLCHICINE 0.6 MG PO TABS
0.6000 mg | ORAL_TABLET | Freq: Every day | ORAL | Status: DC
  Administered 2020-03-28: 0.6 mg via ORAL
  Filled 2020-03-28: qty 1

## 2020-03-28 MED ORDER — HYDRALAZINE HCL 25 MG PO TABS
25.0000 mg | ORAL_TABLET | Freq: Three times a day (TID) | ORAL | Status: DC
  Administered 2020-03-28: 25 mg via ORAL
  Filled 2020-03-28: qty 1

## 2020-03-28 MED ORDER — HYDRALAZINE HCL 25 MG PO TABS: 25.0000 mg | ORAL_TABLET | Freq: Three times a day (TID) | ORAL | 0 refills | Status: DC

## 2020-03-28 MED ORDER — COLCHICINE 0.6 MG PO TABS: 0.6000 mg | ORAL_TABLET | Freq: Every day | ORAL | 0 refills | Status: DC

## 2020-03-28 MED ORDER — CLONIDINE HCL 0.2 MG PO TABS
0.3000 mg | ORAL_TABLET | ORAL | Status: DC
  Administered 2020-03-28: 0.3 mg via ORAL
  Filled 2020-03-28: qty 1

## 2020-03-28 NOTE — Progress Notes (Signed)
Carotid duplex study completed.   See Cv Proc for preliminary results.   Dannon Perlow  

## 2020-03-28 NOTE — Progress Notes (Signed)
  Echocardiogram 2D Echocardiogram has been performed.  Sherry Fitzpatrick 03/28/2020, 2:44 PM

## 2020-03-28 NOTE — Discharge Instructions (Signed)

## 2020-03-28 NOTE — Progress Notes (Signed)
Luberta Robertson to be D/C'd Home per MD order.  Discussed with the patient and all questions fully answered.   VSS, Skin clean, dry and intact without evidence of skin break down, no evidence of skin tears noted. IV catheter discontinued intact. Site without signs and symptoms of complications. Dressing and pressure applied.   An After Visit Summary was printed and given to the patient.    D/C education completed with patient/family including follow up instructions, medication list, d/c activities limitations if indicated, with other d/c instructions as indicated by MD - patient able to verbalize understanding, all questions fully answered.    Patient instructed to return to ED, call 911, or call MD for any changes in condition.    Patient escorted via [place], and D/C home via private car.

## 2020-03-28 NOTE — CV Procedure (Signed)
Echo and Vascular exam attempted but patient refused to have done at this time.

## 2020-03-28 NOTE — Progress Notes (Signed)
STROKE TEAM PROGRESS NOTE   INTERVAL HISTORY RN at bedside.  Patient sitting in chair, still complains of mild headache, much improved from yesterday.  However today she had right painful thumb, concerning for gout, received colchicine.  BP improved, however still fluctuate.  Vitals:   03/28/20 0012 03/28/20 0427 03/28/20 0727 03/28/20 0753  BP: (!) 138/46 (!) 154/69  (!) 179/84  Pulse: (!) 55 (!) 58  63  Resp: 20 20  16   Temp: 98.1 F (36.7 C) 97.6 F (36.4 C)  97.7 F (36.5 C)  TempSrc: Oral Oral  Oral  SpO2: 97% 97% 98% 97%  Weight:      Height:        CBC:  Recent Labs  Lab 03/27/20 0555 03/27/20 0602  WBC 7.9  --   NEUTROABS 3.8  --   HGB 11.7* 12.2  HCT 36.9 36.0  MCV 90.7  --   PLT 283  --     Basic Metabolic Panel:  Recent Labs  Lab 03/27/20 0555 03/27/20 0602  NA 139 142  K 3.5 3.4*  CL 101 102  CO2 28  --   GLUCOSE 114* 108*  BUN 26* 27*  CREATININE 1.29* 1.20*  CALCIUM 8.7*  --    Lipid Panel:     Component Value Date/Time   CHOL 174 03/27/2020 1730   TRIG 182 (H) 03/27/2020 1730   HDL 32 (L) 03/27/2020 1730   CHOLHDL 5.4 03/27/2020 1730   VLDL 36 03/27/2020 1730   LDLCALC 106 (H) 03/27/2020 1730   HgbA1c:  Lab Results  Component Value Date   HGBA1C 7.1 (H) 03/27/2020   Urine Drug Screen:     Component Value Date/Time   LABOPIA NONE DETECTED 07/01/2018 0625   COCAINSCRNUR NONE DETECTED 07/01/2018 0625   LABBENZ NONE DETECTED 07/01/2018 0625   AMPHETMU NONE DETECTED 07/01/2018 0625   THCU NONE DETECTED 07/01/2018 0625   LABBARB NONE DETECTED 07/01/2018 0625    Alcohol Level No results found for: ETH  IMAGING past 24 hours MR BRAIN WO CONTRAST  Result Date: 03/27/2020 CLINICAL DATA:  TIA. Severe headache. Slurred speech and gait imbalance with facial droop EXAM: MRI HEAD WITHOUT CONTRAST TECHNIQUE: Multiplanar, multiecho pulse sequences of the brain and surrounding structures were obtained without intravenous contrast. COMPARISON:   Head CT earlier today and brain MRI 05/06/2019 FINDINGS: Brain: No acute infarction, hemorrhage, hydrocephalus, extra-axial collection or mass lesion. Chronic small vessel ischemia with confluent gliosis in the deep white matter and mild T2 hyperintensity in the bilateral thalamus. Age normal brain volume. Vascular: Normal flow voids, including vertebrobasilar. Skull and upper cervical spine: Normal marrow signal. Sinuses/Orbits: Negative. IMPRESSION: 1. No acute finding, including infarct. 2. Chronic small vessel disease. Electronically Signed   By: Monte Fantasia M.D.   On: 03/27/2020 11:12    PHYSICAL EXAM  Temp:  [97.6 F (36.4 C)-98.7 F (37.1 C)] 97.9 F (36.6 C) (06/14 1555) Pulse Rate:  [55-63] 60 (06/14 1555) Resp:  [14-20] 14 (06/14 1555) BP: (100-192)/(46-84) 100/65 (06/14 1555) SpO2:  [97 %-100 %] 100 % (06/14 1555)  General - obese, well developed, in no apparent distress.  Ophthalmologic - fundi not visualized due to noncooperation.  Cardiovascular - Regular rhythm and rate.  Mental Status -  Level of arousal and orientation to time, place, and person were intact. Language including expression, naming, repetition, comprehension was assessed and found intact. Fund of Knowledge was assessed and was intact.  Cranial Nerves II - XII - II -  Visual field intact OU. III, IV, VI - Extraocular movements intact. V - Facial sensation intact bilaterally. VII - Facial movement intact bilaterally. VIII - Hearing & vestibular intact bilaterally. X - Palate elevates symmetrically. XI - Chin turning & shoulder shrug intact bilaterally. XII - Tongue protrusion intact.  Motor Strength - The patient's strength was normal in all extremities and pronator drift was absent.  Right thumb MP joint and IP joint tenderness to touch. Bulk was normal and fasciculations were absent.   Motor Tone - Muscle tone was assessed at the neck and appendages and was normal.  Reflexes - The patient's  reflexes were symmetrical in all extremities and she had no pathological reflexes.  Sensory - Light touch, temperature/pinprick were assessed and were symmetrical.    Coordination - The patient had normal movements in the hands with no ataxia or dysmetria.  Tremor was absent.  Gait and Station - deferred.   ASSESSMENT/PLAN Sherry Fitzpatrick is a 69 y.o. female with history of CVA/TIA, hypertension, carpal tunnel syndrome, obesity, sleep apnea presents to the emergency department with severe headache, slurred speech, gait imbalance and facial droop.   Likely hypertensive encephalopathy  Code Stroke CT head No acute abnormality. Small vessel disease. ASPECTS 10.     MRI  No acute abnormality. Small vessel disease.   Carotid Doppler  B ICA 1-39% stenosis, VAs antegrade   2D Echo EF 60-65%  LDL 106  HgbA1c 7.1  Lovenox 40 mg sq daily for VTE prophylaxis  clopidogrel 75 mg daily prior to admission, now on clopidogrel 75 mg daily. Continue on discharge.   Therapy recommendations:  No therapy needs  Disposition:  Return home  Hx of hypertensive encephalopathy vs. stroke/TIA (no documentation of stroke in EPIC)  04/2019 - hypertensive encephalopathy. MRI neg stroke  03/2015 - R sided pain and weakness. MRI neg  Pt reports mini stroke x 2 ( 2004, 2011 ), residual sxs being some issues w/ memory, mild RLE weakness   Hypertensive Urgency  BP as high as 212/188 on arrival  Home BP meds: amlodipine ; irbisartan ; carvedilol ; clonidine  Elevated in the 170s . Gradually normalize in 2-3 days . Long-term BP goal normotensive  Hyperlipidemia  Home meds:  lipitor 80, resumed in hospital  LDL 106, goal < 70  Continue statin at discharge  Diabetes type II Uncontrolled  Home diabetic meds: metformin  HgbA1c 7.1, goal < 7.0  CBGs  SSI  PCP follow-up  Gout  Right thumb MP joint and IP joint tenderness to touch  Likely gout  On colchicine  Other Stroke Risk  Factors  Advanced age  ETOH use,  advised to drink no more than 1 drink(s) a day  Obesity, Body mass index is 37.76 kg/m., recommend weight loss, diet and exercise as appropriate   Obstructive sleep apnea, not on CPAP at home  Diastolic CHF  Other Active Problems  Mild hypokalemia - 3.4  CKD - stage  3b - creatinine - 1.29->1.20     COPD  Normocytic anemia Hgb 11.7  Hospital day # 0  Neurology will sign off. Please call with questions. Pt will follow up with Dr. Delice Lesch at [facility] on 05/11/20. Thanks for the consult.  Sherry Hawking, MD PhD Stroke Neurology 03/28/2020 5:24 PM   To contact Stroke Continuity provider, please refer to [email or web address] After hours, contact General Neurology

## 2020-03-28 NOTE — Evaluation (Signed)
Physical Therapy Evaluation Patient Details Name: Sherry Fitzpatrick MRN: [id number] DOB: 1951/03/26 Today's Date: 03/28/2020   History of Present Illness   Sherry Fitzpatrick is a 69 y.o. female with medical history significant of TIA/CVA, HTN, HLD, heart murmur, diabetes mellitus type 2, OSA not on CPAP, arthritis, and obesity who presents with complaints of having a severe headache, blurry vision, difficulty walking this morning around 4:30 AM. She was last seen normal by her daughter around 2 AM. She reported associa patients ted symptoms of epigastric pressure, palpitations, anxiety, and reports of a dry cough over the last several days. In route with EMS patient was noted to have some slurred speech with right-sided facial droop.  Clinical Impression  Pt admitted with above diagnosis. Pt was able to ambulate without device with good safety overall just a little slow.  Pt states she feels close to baseline but is a little bit slower than normal.  Will follow in hospital to ensure that pt is getting OOB and mobilizing.  Will not need f/u at home and has equipment prn and states she will use if needed.   Pt currently with functional limitations due to the deficits listed below (see PT Problem List). Pt will benefit from skilled PT to increase their independence and safety with mobility to allow discharge to the venue listed below.      Follow Up Recommendations No PT follow up;Supervision - Intermittent    Equipment Recommendations  None recommended by PT    Recommendations for Other Services       Precautions / Restrictions Precautions Precautions: Fall Restrictions Weight Bearing Restrictions: No      Mobility  Bed Mobility Overal bed mobility: Independent                Transfers Overall transfer level: Independent                  Ambulation/Gait Ambulation/Gait assistance: Supervision Gait Distance (Feet): 65 Feet Assistive device: None Gait Pattern/deviations:  Step-through pattern;Decreased stride length   Gait velocity interpretation: <1.8 ft/sec, indicate of risk for recurrent falls General Gait Details: Pt declined walking in hallway but manuevered around room without difficulty. Can turn head both directions and has no difficulty maneuvering arohund obstacles. No LOB and feels that she is close to baseline. Pt donned socks and walked to sink and brushed teeth with good balance.   Stairs            Wheelchair Mobility    Modified Rankin (Stroke Patients Only) Modified Rankin (Stroke Patients Only) Pre-Morbid Rankin Score: No symptoms Modified Rankin: No significant disability     Balance Overall balance assessment: Needs assistance Sitting-balance support: No upper extremity supported;Feet supported Sitting balance-Leahy Scale: Good     Standing balance support: No upper extremity supported;During functional activity Standing balance-Leahy Scale: Fair                               Pertinent Vitals/Pain Pain Assessment: No/denies pain    Home Living Family/patient expects to be discharged to:: Private residence Living Arrangements: Children Available Help at Discharge: Family;Available 24 hours/day Type of Home: House Home Access: Level entry     Home Layout: Multi-level Home Equipment: Walker - 2 wheels;Bedside commode;Cane - single point;Tub bench      Prior Function Level of Independence: Independent         Comments: daughter does most housework and heavy meal prep  Hand Dominance   Dominant Hand: Right    Extremity/Trunk Assessment   Upper Extremity Assessment Upper Extremity Assessment: Defer to OT evaluation    Lower Extremity Assessment Lower Extremity Assessment: Overall WFL for tasks assessed    Cervical / Trunk Assessment Cervical / Trunk Assessment: Normal  Communication   Communication: No difficulties  Cognition Arousal/Alertness: Awake/alert Behavior During Therapy:  WFL for tasks assessed/performed Overall Cognitive Status: Within Functional Limits for tasks assessed                                        General Comments      Exercises     Assessment/Plan    PT Assessment Patient needs continued PT services  PT Problem List Decreased activity tolerance;Decreased balance;Decreased mobility;Decreased knowledge of use of DME;Decreased safety awareness;Decreased knowledge of precautions       PT Treatment Interventions DME instruction;Gait training;Functional mobility training;Therapeutic activities;Stair training;Therapeutic exercise;Balance training;Patient/family education    PT Goals (Current goals can be found in the Care Plan section)  Acute Rehab PT Goals Patient Stated Goal: to go home PT Goal Formulation: With patient Time For Goal Achievement: 04/11/20 Potential to Achieve Goals: Good    Frequency Min 4X/week   Barriers to discharge        Co-evaluation               AM-PAC PT "6 Clicks" Mobility  Outcome Measure Help needed turning from your back to your side while in a flat bed without using bedrails?: None Help needed moving from lying on your back to sitting on the side of a flat bed without using bedrails?: None Help needed moving to and from a bed to a chair (including a wheelchair)?: None Help needed standing up from a chair using your arms (e.g., wheelchair or bedside chair)?: A Little Help needed to walk in hospital room?: A Little Help needed climbing 3-5 steps with a railing? : A Little 6 Click Score: 21    End of Session Equipment Utilized During Treatment: Gait belt Activity Tolerance: Patient limited by fatigue Patient left: in chair;with call bell/phone within reach Nurse Communication: Mobility status PT Visit Diagnosis: Muscle weakness (generalized) (M62.81)    Time: 1194-1740 PT Time Calculation (min) (ACUTE ONLY): 23 min   Charges:   PT Evaluation $PT Eval Moderate  Complexity: 1 Mod PT Treatments $Gait Training: 8-22 mins        Seriah Brotzman W,PT Acute Rehabilitation Services Pager:  [phone]  Office:  [phone]    Denice Paradise 03/28/2020, 10:56 AM

## 2020-03-28 NOTE — Plan of Care (Signed)
Pt understanding of plan of care 

## 2020-03-28 NOTE — Progress Notes (Signed)
   03/28/20 0900  OT Visit Information  Last OT Received On 03/28/20  Reason Eval/Treat Not Completed OT screened, no needs identified, will sign off  Nestor Lewandowsky, OTR/[facility] [place] Pager: [phone] Office: [phone]

## 2020-03-28 NOTE — Discharge Summary (Signed)
Physician Discharge Summary  ARSHIA SPELLMAN HLK:[id number] DOB: 1951/03/03 DOA: 03/27/2020  PCP: Glenis Smoker, MD  Admit date: 03/27/2020 Discharge date: 03/28/2020  Admitted From: Home Disposition: Home  Recommendations for Outpatient Follow-up:  1. Follow up with PCP in 1-2 weeks 2. Please obtain BMP/CBC in one week 3. Please follow up with your PCP on the following pending results: Unresulted Labs (From admission, onward) Comment         None       Home Health: None Equipment/Devices: None  Discharge Condition: Stable CODE STATUS: Full code Diet recommendation: Low sodium diet  Subjective: Seen and examined this morning.  Felt much better.  Only complaint was right thumb pain.  No other neurological symptoms.  HPI: LARRI BREWTON is a 69 y.o. female with medical history significant of TIA/CVA, HTN, HLD, heart murmur, diabetes mellitus type 2, OSA not on CPAP, arthritis, and obesity who presents with complaints of having a severe headache, blurry vision, difficulty walking this morning around 4:30 AM. She was last seen normal by her daughter around 2 AM. She reported associa patients ted symptoms of epigastric pressure, palpitations, anxiety, and reports of a dry cough over the last several days. In route with EMS patient was noted to have some slurred speech with right-sided facial droop. Patient denied having any nausea, vomiting, diaphoresis, or dysuria, or chest pain.  Patient's daughter notes patient has had several TIAs and at least one stroke in the past.  She had previously had right-sided weakness with prior episodes.  At home her daughter monitors her medications and states that she has been taking them all as prescribed.  However, blood pressures are intermittently into the 200s.   ED Course: Patient presented as a code stroke by EMS.  She was seen by neurology and initial CT scan of the brain showed only chronic changes with no acute abnormality.  NIHSS score  0.  Patient was not a candidate for TPA as symptoms mostly had resolved.  Labs significant for hemoglobin 11.7. Chest x-ray significant for cardiomegaly without superimposed infection. Covid 19 screening negative.  Patient reports symptoms lasted approximately 1 to 2 hours before resolving.  Brief/Interim Summary: Patient was admitted under hospital service with a strokelike symptoms and further work-up to rule out a stroke.  Patient's initial CT head/code stroke without contrast was unremarkable.  This was followed with MRI brain which was negative for any acute stroke as well.  Carotid ultrasound also showed less than 39% stenosis bilaterally.  Echo was done but results are pending.  She was also found to have significantly elevated blood pressure upon admission which has since improved significantly only with resuming her home medications but it is still slightly elevated than what it should be.  Patient has been cleared by neurology to discharge on home medications and continue Plavix and Lipitor.  I had a discussion with the patient's daughter Claudie Fisherman over the phone and her daughter is the one who manages her medications and she verified that she has been giving the patient all her antihypertensive medications along with others at home but also stated that they never check blood pressure at home.  I advised her to check blood pressure twice a day starting tomorrow and I have prescribed her hydralazine 25 mg p.o. 3 times daily as a backup option in case her blood pressure runs over 734 systolic then patient should start taking hydralazine on daily basis as scheduled and follow-up with PCP within 1 week.  Based  on all of this, her symptoms are likely secondary to hypertensive emergency/hypertensive encephalopathy.  She also complained of base of thumb pain in the right hand this morning which was tender to palpation and was consistent with gout.  She was given 1.2 mg of colchicine with minimal relief.  1 dose  of 0.6 mg of colchicine was repeated.  She felt better.  She is being discharged on 7 days of colchicine for treatment of gout.  PCP to follow-up within 1 week.  Discharge Diagnoses:  Principal Problem:   Stroke-like symptoms Active Problems:   Right sided weakness   Hyperlipidemia   Hypertensive emergency   Morbid obesity ([place])   CKD (chronic kidney disease), stage III   Normocytic anemia   History of CVA (cerebrovascular accident)   Hypertensive encephalopathy   Gout attack    Discharge Instructions   Allergies as of 03/28/2020      Reactions   Morphine And Related Hives, Nausea And Vomiting   Penicillins Hives   Has patient had a PCN reaction causing immediate rash, facial/tongue/throat swelling, SOB or lightheadedness with hypotension: Yes Has patient had a PCN reaction causing severe rash involving mucus membranes or skin necrosis: No Has patient had a PCN reaction that required hospitalization: No Has patient had a PCN reaction occurring within the last 10 years: No If all of the above answers are "NO", then may proceed with Cephalosporin use.   Percocet [oxycodone-acetaminophen] Nausea And Vomiting   Ace Inhibitors Nausea And Vomiting, Cough   Iodinated Diagnostic Agents Nausea And Vomiting      Iodine Itching   Etodolac Nausea And Vomiting   Tramadol Nausea And Vomiting   Vicodin [hydrocodone-acetaminophen] Nausea And Vomiting      Medication List    TAKE these medications   acetaminophen 500 MG tablet Commonly known as: TYLENOL Take 1,000 mg by mouth every 6 (six) hours as needed for headache (pain).   albuterol 108 (90 Base) MCG/ACT inhaler Commonly known as: VENTOLIN HFA Inhale 2 puffs into the lungs every 6 (six) hours as needed for wheezing. What changed: reasons to take this   amLODipine 10 MG tablet Commonly known as: NORVASC Take 1 tablet (10 mg total) by mouth daily. What changed: when to take this   atorvastatin 80 MG tablet Commonly known  as: LIPITOR Take 1 tablet (80 mg total) by mouth daily. What changed: when to take this   budesonide-formoterol 160-4.5 MCG/ACT inhaler Commonly known as: Symbicort Inhale 2 puffs into the lungs 2 (two) times daily.   carvedilol 6.25 MG tablet Commonly known as: COREG Take 6.25 mg by mouth 2 (two) times daily.   cloNIDine 0.3 MG tablet Commonly known as: CATAPRES Take 1 tablet (0.3 mg total) by mouth 2 (two) times daily. What changed: additional instructions   clopidogrel 75 MG tablet Commonly known as: PLAVIX Take 1 tablet (75 mg total) by mouth daily.   colchicine 0.6 MG tablet Take 1 tablet (0.6 mg total) by mouth daily for 7 days. Start taking on: March 29, 2020   furosemide 40 MG tablet Commonly known as: LASIX Take 1 tablet (40 mg total) by mouth daily at 2 PM. What changed: when to take this   guaiFENesin 600 MG 12 hr tablet Commonly known as: MUCINEX Take 2 tablets (1,200 mg total) by mouth 2 (two) times daily.   hydrALAZINE 25 MG tablet Commonly known as: APRESOLINE Take 1 tablet (25 mg total) by mouth every 8 (eight) hours.   ipratropium-albuterol 0.5-2.5 (3)  MG/3ML Soln Commonly known as: DUONEB Take 3 mLs by nebulization every 4 (four) hours as needed. What changed: reasons to take this   metFORMIN 500 MG tablet Commonly known as: Glucophage Take 1 tablet (500 mg total) by mouth 2 (two) times daily with a meal.   potassium chloride 10 MEQ tablet Commonly known as: KLOR-CON Take 2 tablets (20 mEq total) by mouth daily. What changed: when to take this   valsartan 320 MG tablet Commonly known as: DIOVAN Take 320 mg by mouth daily.       Follow-up Information    Glenis Smoker, MD Follow up in 1 week(s).   Specialty: Family Medicine Contact information: [address] [place] [address] [phone]              Allergies  Allergen Reactions  . Morphine And Related Hives and Nausea And Vomiting  . Penicillins Hives     Has patient had a PCN reaction causing immediate rash, facial/tongue/throat swelling, SOB or lightheadedness with hypotension: Yes Has patient had a PCN reaction causing severe rash involving mucus membranes or skin necrosis: No Has patient had a PCN reaction that required hospitalization: No Has patient had a PCN reaction occurring within the last 10 years: No If all of the above answers are "NO", then may proceed with Cephalosporin use.  Marland Kitchen Percocet [Oxycodone-Acetaminophen] Nausea And Vomiting  . Ace Inhibitors Nausea And Vomiting and Cough  . Iodinated Diagnostic Agents Nausea And Vomiting       . Iodine Itching  . Etodolac Nausea And Vomiting  . Tramadol Nausea And Vomiting  . Vicodin [Hydrocodone-Acetaminophen] Nausea And Vomiting    Consultations: Neurology   Procedures/Studies: DG Chest 2 View  Result Date: 03/27/2020 CLINICAL DATA:  Code stroke. EXAM: CHEST - 2 VIEW COMPARISON:  11/21/2019 FINDINGS: Grossly unchanged enlarged cardiac silhouette and mediastinal contours. Improved aeration of the lungs. No discrete focal airspace opacities. No pleural effusion or pneumothorax. No acute osseous abnormalities. IMPRESSION: Cardiomegaly without superimposed acute cardiopulmonary disease on this AP portable examination. Electronically Signed   By: Sandi Mariscal M.D.   On: 03/27/2020 08:41   MR BRAIN WO CONTRAST  Result Date: 03/27/2020 CLINICAL DATA:  TIA. Severe headache. Slurred speech and gait imbalance with facial droop EXAM: MRI HEAD WITHOUT CONTRAST TECHNIQUE: Multiplanar, multiecho pulse sequences of the brain and surrounding structures were obtained without intravenous contrast. COMPARISON:  Head CT earlier today and brain MRI 05/06/2019 FINDINGS: Brain: No acute infarction, hemorrhage, hydrocephalus, extra-axial collection or mass lesion. Chronic small vessel ischemia with confluent gliosis in the deep white matter and mild T2 hyperintensity in the bilateral thalamus. Age normal  brain volume. Vascular: Normal flow voids, including vertebrobasilar. Skull and upper cervical spine: Normal marrow signal. Sinuses/Orbits: Negative. IMPRESSION: 1. No acute finding, including infarct. 2. Chronic small vessel disease. Electronically Signed   By: Monte Fantasia M.D.   On: 03/27/2020 11:12   DG Hand Complete Right  Result Date: 03/28/2020 CLINICAL DATA:  Right hand pain for 2 weeks EXAM: RIGHT HAND - COMPLETE 3+ VIEW COMPARISON:  None. FINDINGS: There is no evidence of fracture or dislocation. Mild joint space narrowing of the thumb IP joint and first CMC joint. No erosion. Soft tissues are unremarkable. IMPRESSION: Mild degenerative changes of the right thumb.  No acute findings. Electronically Signed   By: Davina Poke D.O.   On: 03/28/2020 16:11   ECHOCARDIOGRAM COMPLETE  Result Date: 03/28/2020    ECHOCARDIOGRAM REPORT   Patient Name:  Neoma L Mines Date of Exam: 03/28/2020 Medical Rec #:  [id number]       Height:       64.0 in Accession #:    [id number]      Weight:       220.0 lb Date of Birth:  1951/08/05        BSA:          2.037 m Patient Age:    69 years        BP:           185/72 mmHg Patient Gender: F               HR:           63 bpm. Exam Location:  Inpatient Procedure: 2D Echo, Cardiac Doppler and Color Doppler Indications:    TIA  History:        Patient has prior history of Echocardiogram examinations, most                 recent 05/06/2019. Obesity, Signs/Symptoms:Murmur; Risk                 Factors:Hypertension, Dyslipidemia and Diabetes.  Sonographer:    Dustin Flock Referring Phys: [phone] [place]  1. Left ventricular ejection fraction, by estimation, is 60 to 65%. The left ventricle has normal function. The left ventricle has no regional wall motion abnormalities. There is moderate left ventricular hypertrophy. Left ventricular diastolic parameters are indeterminate.  2. Right ventricular systolic function is normal. The right ventricular  size is normal. There is normal pulmonary artery systolic pressure.  3. The mitral valve is normal in structure. Trivial mitral valve regurgitation.  4. The aortic valve was not well visualized. Aortic valve regurgitation is not visualized. Mild aortic valve stenosis. Vmax 2.4 m/s, Mg 12 mmHg, AVA 1.7 cm^2, DI 0.45 FINDINGS  Left Ventricle: Left ventricular ejection fraction, by estimation, is 60 to 65%. The left ventricle has normal function. The left ventricle has no regional wall motion abnormalities. The left ventricular internal cavity size was normal in size. There is  moderate left ventricular hypertrophy. Left ventricular diastolic parameters are indeterminate. Right Ventricle: The right ventricular size is normal. Right vetricular wall thickness was not assessed. Right ventricular systolic function is normal. There is normal pulmonary artery systolic pressure. The tricuspid regurgitant velocity is 2.35 m/s, and with an assumed right atrial pressure of 3 mmHg, the estimated right ventricular systolic pressure is 67.1 mmHg. Left Atrium: Left atrial size was normal in size. Right Atrium: Right atrial size was normal in size. Pericardium: There is no evidence of pericardial effusion. Mitral Valve: The mitral valve is normal in structure. Trivial mitral valve regurgitation. Tricuspid Valve: The tricuspid valve is normal in structure. Tricuspid valve regurgitation is trivial. Aortic Valve: The aortic valve was not well visualized. Aortic valve regurgitation is not visualized. Mild aortic stenosis is present. Aortic valve mean gradient measures 12.0 mmHg. Aortic valve peak gradient measures 22.3 mmHg. Aortic valve area, by VTI  measures 1.72 cm. Pulmonic Valve: The pulmonic valve was not well visualized. Pulmonic valve regurgitation is not visualized. Aorta: The aortic root is normal in size and structure. IAS/Shunts: The interatrial septum was not well visualized.  LEFT VENTRICLE PLAX 2D LVIDd:         4.60 cm   Diastology LVIDs:         3.10 cm  LV e' lateral:   7.62 cm/s LV PW:  1.30 cm  LV E/e' lateral: 16.0 LV IVS:        1.40 cm  LV e' medial:    6.96 cm/s LVOT diam:     2.20 cm  LV E/e' medial:  17.5 LV SV:         95 LV SV Index:   47 LVOT Area:     3.80 cm  RIGHT VENTRICLE RV Basal diam:  3.20 cm RV S prime:     8.27 cm/s TAPSE (M-mode): 3.0 cm LEFT ATRIUM             Index       RIGHT ATRIUM           Index LA diam:        3.70 cm 1.82 cm/m  RA Area:     13.30 cm LA Vol (A2C):   51.6 ml 25.33 ml/m RA Volume:   31.50 ml  15.46 ml/m LA Vol (A4C):   72.5 ml 35.59 ml/m LA Biplane Vol: 67.0 ml 32.89 ml/m  AORTIC VALVE AV Area (Vmax):    1.90 cm AV Area (Vmean):   1.98 cm AV Area (VTI):     1.72 cm AV Vmax:           236.00 cm/s AV Vmean:          168.000 cm/s AV VTI:            0.556 m AV Peak Grad:      22.3 mmHg AV Mean Grad:      12.0 mmHg LVOT Vmax:         118.00 cm/s LVOT Vmean:        87.600 cm/s LVOT VTI:          0.251 m LVOT/AV VTI ratio: 0.45  AORTA Ao Root diam: 2.90 cm MITRAL VALVE                TRICUSPID VALVE MV Area (PHT): 2.82 cm     TR Peak grad:   22.1 mmHg MV Decel Time: 269 msec     TR Vmax:        235.00 cm/s MV E velocity: 122.00 cm/s MV A velocity: 52.00 cm/s   SHUNTS MV E/A ratio:  2.35         Systemic VTI:  0.25 m                             Systemic Diam: 2.20 cm Oswaldo Milian MD Electronically signed by Oswaldo Milian MD Signature Date/Time: 03/28/2020/4:46:56 PM    Final    CT HEAD CODE STROKE WO CONTRAST  Result Date: 03/27/2020 CLINICAL DATA:  Code stroke.  Blurry vision EXAM: CT HEAD WITHOUT CONTRAST TECHNIQUE: Contiguous axial images were obtained from the base of the skull through the vertex without intravenous contrast. COMPARISON:  Head CT 05/05/2019 FINDINGS: Brain: There is no mass, hemorrhage or extra-axial collection. The size and configuration of the ventricles and extra-axial CSF spaces are normal. There is hypoattenuation of the  periventricular white matter, most commonly indicating chronic ischemic microangiopathy. Vascular: No abnormal hyperdensity of the major intracranial arteries or dural venous sinuses. No intracranial atherosclerosis. Skull: The visualized skull base, calvarium and extracranial soft tissues are normal. Sinuses/Orbits: No fluid levels or advanced mucosal thickening of the visualized paranasal sinuses. No mastoid or middle ear effusion. The orbits are normal. ASPECTS [facility] Stroke Program Early CT Score) - Ganglionic level infarction (caudate, lentiform  nuclei, internal capsule, insula, M1-M3 cortex): 7 - Supraganglionic infarction (M4-M6 cortex): 3 Total score (0-10 with 10 being normal): 10 IMPRESSION: 1. Chronic ischemic microangiopathy without acute intracranial abnormality. 2. ASPECTS is 10. These results were communicated to Dr. Karena Addison Aroor at 6:01 am on 03/27/2020 by text page via the [facility] messaging system. Electronically Signed   By: Ulyses Jarred M.D.   On: 03/27/2020 06:01   VAS US CAROTID  Result Date: 03/28/2020 Carotid Arterial Duplex Study Indications:       TIA. Risk Factors:      Hypertension, hyperlipidemia, prior CVA. Comparison Study:  Prior study 05/06/2019 showed bilateral stenosis 1-39%. Performing Technologist: Darlin Coco Supporting Technologist: Oda Cogan RDMS, RVT  Examination Guidelines: A complete evaluation includes B-mode imaging, spectral Doppler, color Doppler, and power Doppler as needed of all accessible portions of each vessel. Bilateral testing is considered an integral part of a complete examination. Limited examinations for reoccurring indications may be performed as noted.  Right Carotid Findings: +----------+--------+--------+--------+------------------+--------+           PSV cm/sEDV cm/sStenosisPlaque DescriptionComments +----------+--------+--------+--------+------------------+--------+ CCA Prox  94      13                                          +----------+--------+--------+--------+------------------+--------+ CCA Distal60      16                                         +----------+--------+--------+--------+------------------+--------+ ICA Prox  91      31      1-39%                              +----------+--------+--------+--------+------------------+--------+ ICA Mid   75      31                                         +----------+--------+--------+--------+------------------+--------+ ICA Distal79      34                                         +----------+--------+--------+--------+------------------+--------+ ECA       149     21                                         +----------+--------+--------+--------+------------------+--------+ +----------+--------+-------+----------------+-------------------+           PSV cm/sEDV cmsDescribe        Arm Pressure (mmHG) +----------+--------+-------+----------------+-------------------+ [id number]      47     Multiphasic, WNL                    +----------+--------+-------+----------------+-------------------+ +---------+--------+--+--------+--+---------+ VertebralPSV cm/s46EDV cm/s15Antegrade +---------+--------+--+--------+--+---------+  Left Carotid Findings: +----------+--------+--------+--------+------------------+--------+           PSV cm/sEDV cm/sStenosisPlaque DescriptionComments +----------+--------+--------+--------+------------------+--------+ CCA Prox  116     18                                         +----------+--------+--------+--------+------------------+--------+  CCA Distal44      9                                          +----------+--------+--------+--------+------------------+--------+ ICA Prox  70      23      1-39%                              +----------+--------+--------+--------+------------------+--------+ ICA Distal60      28                                          +----------+--------+--------+--------+------------------+--------+ ECA       107     15                                         +----------+--------+--------+--------+------------------+--------+ +----------+--------+--------+----------------+-------------------+           PSV cm/sEDV cm/sDescribe        Arm Pressure (mmHG) +----------+--------+--------+----------------+-------------------+ Subclavian118             Multiphasic, WNL                    +----------+--------+--------+----------------+-------------------+ +---------+--------+--+--------+--+---------+ VertebralPSV cm/s36EDV cm/s16Antegrade +---------+--------+--+--------+--+---------+   Summary: Right Carotid: Velocities in the right ICA are consistent with a 1-39% stenosis. Left Carotid: Velocities in the left ICA are consistent with a 1-39% stenosis. Vertebrals: Bilateral vertebral arteries demonstrate antegrade flow. *See table(s) above for measurements and observations.     Preliminary       Discharge Exam: Vitals:   03/28/20 1234 03/28/20 1555  BP: (!) 185/72 100/65  Pulse: 63 60  Resp: 18 14  Temp: 98.7 F (37.1 C) 97.9 F (36.6 C)  SpO2: 99% 100%   Vitals:   03/28/20 0727 03/28/20 0753 03/28/20 1234 03/28/20 1555  BP:  (!) 179/84 (!) 185/72 100/65  Pulse:  63 63 60  Resp:  16 18 14   Temp:  97.7 F (36.5 C) 98.7 F (37.1 C) 97.9 F (36.6 C)  TempSrc:  Oral Oral Oral  SpO2: 98% 97% 99% 100%  Weight:      Height:        General: Pt is alert, awake, not in acute distress Cardiovascular: RRR, S1/S2 +, no rubs, no gallops Respiratory: CTA bilaterally, no wheezing, no rhonchi Abdominal: Soft, NT, ND, bowel sounds + Extremities: no edema, no cyanosis    The results of significant diagnostics from this hospitalization (including imaging, microbiology, ancillary and laboratory) are listed below for reference.     Microbiology: Recent Results (from the past 240 hour(s))  SARS Coronavirus  2 by RT PCR (hospital order, performed in [facility] hospital lab) Nasopharyngeal Nasopharyngeal Swab     Status: None   Collection Time: 03/27/20  6:29 AM   Specimen: Nasopharyngeal Swab  Result Value Ref Range Status   SARS Coronavirus 2 NEGATIVE NEGATIVE Final    Comment: (NOTE) SARS-CoV-2 target nucleic acids are NOT DETECTED.  The SARS-CoV-2 RNA is generally detectable in upper and lower respiratory specimens during the acute phase of infection. The lowest concentration of SARS-CoV-2 viral copies this assay can detect is 250 copies / mL. A  negative result does not preclude SARS-CoV-2 infection and should not be used as the sole basis for treatment or other patient management decisions.  A negative result may occur with improper specimen collection / handling, submission of specimen other than nasopharyngeal swab, presence of viral mutation(s) within the areas targeted by this assay, and inadequate number of viral copies (<250 copies / mL). A negative result must be combined with clinical observations, patient history, and epidemiological information.  Fact Sheet for Patients:   StrictlyIdeas.no  Fact Sheet for Healthcare Providers: BankingDealers.co.za  This test is not yet approved or  cleared by the [place] FDA and has been authorized for detection and/or diagnosis of SARS-CoV-2 by FDA under an Emergency Use Authorization (EUA).  This EUA will remain in effect (meaning this test can be used) for the duration of the COVID-19 declaration under Section 564(b)(1) of the Act, 21 U.S.C. section 360bbb-3(b)(1), unless the authorization is terminated or revoked sooner.  Performed at [facility], [place] [address] [place], [place] [address]      Labs: BNP (last 3 results) Recent Labs    07/13/19 0921 10/29/19 1207 11/21/19 1432  BNP 96.7 45.3 82.5   Basic Metabolic Panel: Recent Labs  Lab 03/27/20 0555  03/27/20 0602  NA 139 142  K 3.5 3.4*  CL 101 102  CO2 28  --   GLUCOSE 114* 108*  BUN 26* 27*  CREATININE 1.29* 1.20*  CALCIUM 8.7*  --    Liver Function Tests: Recent Labs  Lab 03/27/20 0555  AST 17  ALT 15  ALKPHOS 57  BILITOT 0.5  PROT 7.0  ALBUMIN 3.3*   No results for input(s): LIPASE, AMYLASE in the last 168 hours. No results for input(s): AMMONIA in the last 168 hours. CBC: Recent Labs  Lab 03/27/20 0555 03/27/20 0602  WBC 7.9  --   NEUTROABS 3.8  --   HGB 11.7* 12.2  HCT 36.9 36.0  MCV 90.7  --   PLT 283  --    Cardiac Enzymes: No results for input(s): CKTOTAL, CKMB, CKMBINDEX, TROPONINI in the last 168 hours. BNP: Invalid input(s): POCBNP CBG: Recent Labs  Lab 03/27/20 1736 03/27/20 2131 03/28/20 0605 03/28/20 1106 03/28/20 1615  GLUCAP 127* 169* 136* 120* 94   D-Dimer No results for input(s): DDIMER in the last 72 hours. Hgb A1c Recent Labs    03/27/20 0555  HGBA1C 7.1*   Lipid Profile Recent Labs    03/27/20 1730  CHOL 174  HDL 32*  LDLCALC 106*  TRIG 182*  CHOLHDL 5.4   Thyroid function studies No results for input(s): TSH, T4TOTAL, T3FREE, THYROIDAB in the last 72 hours.  Invalid input(s): FREET3 Anemia work up No results for input(s): VITAMINB12, FOLATE, FERRITIN, TIBC, IRON, RETICCTPCT in the last 72 hours. Urinalysis    Component Value Date/Time   COLORURINE YELLOW 03/27/2020 2333   APPEARANCEUR HAZY (A) 03/27/2020 2333   LABSPEC 1.019 03/27/2020 2333   PHURINE 5.0 03/27/2020 2333   GLUCOSEU NEGATIVE 03/27/2020 2333   HGBUR SMALL (A) 03/27/2020 2333   BILIRUBINUR NEGATIVE 03/27/2020 2333   KETONESUR NEGATIVE 03/27/2020 2333   PROTEINUR NEGATIVE 03/27/2020 2333   UROBILINOGEN 0.2 04/14/2015 1451   NITRITE NEGATIVE 03/27/2020 2333   LEUKOCYTESUR TRACE (A) 03/27/2020 2333   Sepsis Labs Invalid input(s): PROCALCITONIN,  WBC,  LACTICIDVEN Microbiology Recent Results (from the past 240 hour(s))  SARS Coronavirus  2 by RT PCR (hospital order, performed in [facility] hospital lab) Nasopharyngeal Nasopharyngeal Swab  Status: None   Collection Time: 03/27/20  6:29 AM   Specimen: Nasopharyngeal Swab  Result Value Ref Range Status   SARS Coronavirus 2 NEGATIVE NEGATIVE Final    Comment: (NOTE) SARS-CoV-2 target nucleic acids are NOT DETECTED.  The SARS-CoV-2 RNA is generally detectable in upper and lower respiratory specimens during the acute phase of infection. The lowest concentration of SARS-CoV-2 viral copies this assay can detect is 250 copies / mL. A negative result does not preclude SARS-CoV-2 infection and should not be used as the sole basis for treatment or other patient management decisions.  A negative result may occur with improper specimen collection / handling, submission of specimen other than nasopharyngeal swab, presence of viral mutation(s) within the areas targeted by this assay, and inadequate number of viral copies (<250 copies / mL). A negative result must be combined with clinical observations, patient history, and epidemiological information.  Fact Sheet for Patients:   StrictlyIdeas.no  Fact Sheet for Healthcare Providers: BankingDealers.co.za  This test is not yet approved or  cleared by the [place] FDA and has been authorized for detection and/or diagnosis of SARS-CoV-2 by FDA under an Emergency Use Authorization (EUA).  This EUA will remain in effect (meaning this test can be used) for the duration of the COVID-19 declaration under Section 564(b)(1) of the Act, 21 U.S.C. section 360bbb-3(b)(1), unless the authorization is terminated or revoked sooner.  Performed at [facility], [place] [address], [place], [place] [address]      Time coordinating discharge: Over 30 minutes  SIGNED:   Darliss Cheney, MD  [facility] 03/28/2020, 4:47 PM  If 7PM-7AM, please contact  night-coverage [email or web address]

## 2020-03-28 NOTE — TOC Benefit Eligibility Note (Signed)
Transition of Care [facility]) Benefit Eligibility Note    Patient Details  Name: Sherry Fitzpatrick MRN: [id number] Date of Birth: [birth year]   Medication/Dose: Clonidine Patch 0.3 once weekly  Covered?: Yes  Tier:  (4)  Prescription Coverage Preferred Pharmacy: [email or web address]  Spoke with Person/Company/Phone Number:: Howard [phone]  Co-Pay: $100.00 for 30 day supply  Prior Approval: No  Deductible: Met       [name] Phone Number: 03/28/2020, 9:24 AM

## 2020-04-22 DIAGNOSIS — J441 Chronic obstructive pulmonary disease with (acute) exacerbation: Secondary | ICD-10-CM | POA: Diagnosis not present

## 2020-05-11 ENCOUNTER — Ambulatory Visit: Payer: Medicare HMO | Admitting: Neurology

## 2020-05-23 DIAGNOSIS — J441 Chronic obstructive pulmonary disease with (acute) exacerbation: Secondary | ICD-10-CM | POA: Diagnosis not present

## 2020-05-24 ENCOUNTER — Other Ambulatory Visit: Payer: Self-pay

## 2020-05-24 ENCOUNTER — Inpatient Hospital Stay (HOSPITAL_COMMUNITY)
Admission: EM | Disposition: A | Discharge status: Rehabilitation Facility | Payer: Self-pay | Source: Home / Self Care | Attending: Cardiology

## 2020-05-24 ENCOUNTER — Inpatient Hospital Stay (HOSPITAL_COMMUNITY)
Admission: EM | Admit: 2020-05-24 | Discharge: 2020-06-01 | DRG: 246 | Disposition: A | Discharge status: Rehabilitation Facility | Payer: Medicare HMO | Attending: Cardiology | Admitting: Cardiology

## 2020-05-24 ENCOUNTER — Emergency Department (HOSPITAL_COMMUNITY): Payer: Medicare HMO

## 2020-05-24 ENCOUNTER — Encounter (HOSPITAL_COMMUNITY): Payer: Self-pay | Admitting: Emergency Medicine

## 2020-05-24 DIAGNOSIS — I5022 Chronic systolic (congestive) heart failure: Secondary | ICD-10-CM

## 2020-05-24 DIAGNOSIS — Z7902 Long term (current) use of antithrombotics/antiplatelets: Secondary | ICD-10-CM

## 2020-05-24 DIAGNOSIS — Z8249 Family history of ischemic heart disease and other diseases of the circulatory system: Secondary | ICD-10-CM | POA: Diagnosis not present

## 2020-05-24 DIAGNOSIS — N189 Chronic kidney disease, unspecified: Secondary | ICD-10-CM | POA: Diagnosis present

## 2020-05-24 DIAGNOSIS — I480 Paroxysmal atrial fibrillation: Secondary | ICD-10-CM | POA: Diagnosis not present

## 2020-05-24 DIAGNOSIS — R0689 Other abnormalities of breathing: Secondary | ICD-10-CM | POA: Diagnosis not present

## 2020-05-24 DIAGNOSIS — R578 Other shock: Secondary | ICD-10-CM | POA: Diagnosis not present

## 2020-05-24 DIAGNOSIS — R569 Unspecified convulsions: Secondary | ICD-10-CM | POA: Diagnosis not present

## 2020-05-24 DIAGNOSIS — I97630 Postprocedural hematoma of a circulatory system organ or structure following a cardiac catheterization: Secondary | ICD-10-CM | POA: Diagnosis present

## 2020-05-24 DIAGNOSIS — Z7984 Long term (current) use of oral hypoglycemic drugs: Secondary | ICD-10-CM

## 2020-05-24 DIAGNOSIS — K219 Gastro-esophageal reflux disease without esophagitis: Secondary | ICD-10-CM | POA: Diagnosis present

## 2020-05-24 DIAGNOSIS — R579 Shock, unspecified: Secondary | ICD-10-CM

## 2020-05-24 DIAGNOSIS — E119 Type 2 diabetes mellitus without complications: Secondary | ICD-10-CM | POA: Diagnosis not present

## 2020-05-24 DIAGNOSIS — R079 Chest pain, unspecified: Secondary | ICD-10-CM | POA: Diagnosis not present

## 2020-05-24 DIAGNOSIS — R1310 Dysphagia, unspecified: Secondary | ICD-10-CM | POA: Diagnosis not present

## 2020-05-24 DIAGNOSIS — I724 Aneurysm of artery of lower extremity: Secondary | ICD-10-CM | POA: Diagnosis not present

## 2020-05-24 DIAGNOSIS — K5730 Diverticulosis of large intestine without perforation or abscess without bleeding: Secondary | ICD-10-CM | POA: Diagnosis not present

## 2020-05-24 DIAGNOSIS — Z955 Presence of coronary angioplasty implant and graft: Secondary | ICD-10-CM | POA: Diagnosis not present

## 2020-05-24 DIAGNOSIS — I4891 Unspecified atrial fibrillation: Secondary | ICD-10-CM | POA: Diagnosis not present

## 2020-05-24 DIAGNOSIS — I639 Cerebral infarction, unspecified: Secondary | ICD-10-CM | POA: Diagnosis not present

## 2020-05-24 DIAGNOSIS — I672 Cerebral atherosclerosis: Secondary | ICD-10-CM | POA: Diagnosis present

## 2020-05-24 DIAGNOSIS — J690 Pneumonitis due to inhalation of food and vomit: Secondary | ICD-10-CM

## 2020-05-24 DIAGNOSIS — J449 Chronic obstructive pulmonary disease, unspecified: Secondary | ICD-10-CM | POA: Diagnosis present

## 2020-05-24 DIAGNOSIS — J9601 Acute respiratory failure with hypoxia: Secondary | ICD-10-CM | POA: Diagnosis not present

## 2020-05-24 DIAGNOSIS — I5020 Unspecified systolic (congestive) heart failure: Secondary | ICD-10-CM | POA: Diagnosis not present

## 2020-05-24 DIAGNOSIS — Z7951 Long term (current) use of inhaled steroids: Secondary | ICD-10-CM

## 2020-05-24 DIAGNOSIS — D62 Acute posthemorrhagic anemia: Secondary | ICD-10-CM | POA: Diagnosis not present

## 2020-05-24 DIAGNOSIS — Z6841 Body Mass Index (BMI) 40.0 and over, adult: Secondary | ICD-10-CM | POA: Diagnosis not present

## 2020-05-24 DIAGNOSIS — I6340 Cerebral infarction due to embolism of unspecified cerebral artery: Secondary | ICD-10-CM | POA: Diagnosis not present

## 2020-05-24 DIAGNOSIS — J3489 Other specified disorders of nose and nasal sinuses: Secondary | ICD-10-CM | POA: Diagnosis not present

## 2020-05-24 DIAGNOSIS — R29703 NIHSS score 3: Secondary | ICD-10-CM | POA: Diagnosis not present

## 2020-05-24 DIAGNOSIS — N179 Acute kidney failure, unspecified: Secondary | ICD-10-CM | POA: Diagnosis not present

## 2020-05-24 DIAGNOSIS — I130 Hypertensive heart and chronic kidney disease with heart failure and stage 1 through stage 4 chronic kidney disease, or unspecified chronic kidney disease: Secondary | ICD-10-CM | POA: Diagnosis present

## 2020-05-24 DIAGNOSIS — G8191 Hemiplegia, unspecified affecting right dominant side: Secondary | ICD-10-CM | POA: Diagnosis not present

## 2020-05-24 DIAGNOSIS — Y838 Other surgical procedures as the cause of abnormal reaction of the patient, or of later complication, without mention of misadventure at the time of the procedure: Secondary | ICD-10-CM | POA: Diagnosis not present

## 2020-05-24 DIAGNOSIS — I6330 Cerebral infarction due to thrombosis of unspecified cerebral artery: Secondary | ICD-10-CM | POA: Insufficient documentation

## 2020-05-24 DIAGNOSIS — I214 Non-ST elevation (NSTEMI) myocardial infarction: Principal | ICD-10-CM | POA: Diagnosis present

## 2020-05-24 DIAGNOSIS — Z880 Allergy status to penicillin: Secondary | ICD-10-CM

## 2020-05-24 DIAGNOSIS — J323 Chronic sphenoidal sinusitis: Secondary | ICD-10-CM | POA: Diagnosis not present

## 2020-05-24 DIAGNOSIS — I5021 Acute systolic (congestive) heart failure: Secondary | ICD-10-CM | POA: Diagnosis not present

## 2020-05-24 DIAGNOSIS — I700 Atherosclerosis of aorta: Secondary | ICD-10-CM | POA: Diagnosis present

## 2020-05-24 DIAGNOSIS — S301XXD Contusion of abdominal wall, subsequent encounter: Secondary | ICD-10-CM | POA: Diagnosis not present

## 2020-05-24 DIAGNOSIS — I5042 Chronic combined systolic (congestive) and diastolic (congestive) heart failure: Secondary | ICD-10-CM | POA: Diagnosis present

## 2020-05-24 DIAGNOSIS — I2510 Atherosclerotic heart disease of native coronary artery without angina pectoris: Secondary | ICD-10-CM | POA: Diagnosis not present

## 2020-05-24 DIAGNOSIS — I749 Embolism and thrombosis of unspecified artery: Secondary | ICD-10-CM | POA: Diagnosis not present

## 2020-05-24 DIAGNOSIS — S7001XA Contusion of right hip, initial encounter: Secondary | ICD-10-CM | POA: Diagnosis not present

## 2020-05-24 DIAGNOSIS — R0902 Hypoxemia: Secondary | ICD-10-CM | POA: Diagnosis not present

## 2020-05-24 DIAGNOSIS — Z794 Long term (current) use of insulin: Secondary | ICD-10-CM

## 2020-05-24 DIAGNOSIS — I69391 Dysphagia following cerebral infarction: Secondary | ICD-10-CM | POA: Diagnosis not present

## 2020-05-24 DIAGNOSIS — E8809 Other disorders of plasma-protein metabolism, not elsewhere classified: Secondary | ICD-10-CM | POA: Diagnosis not present

## 2020-05-24 DIAGNOSIS — G9341 Metabolic encephalopathy: Secondary | ICD-10-CM | POA: Diagnosis not present

## 2020-05-24 DIAGNOSIS — N281 Cyst of kidney, acquired: Secondary | ICD-10-CM | POA: Diagnosis not present

## 2020-05-24 DIAGNOSIS — Z91041 Radiographic dye allergy status: Secondary | ICD-10-CM

## 2020-05-24 DIAGNOSIS — D72829 Elevated white blood cell count, unspecified: Secondary | ICD-10-CM | POA: Diagnosis not present

## 2020-05-24 DIAGNOSIS — E876 Hypokalemia: Secondary | ICD-10-CM | POA: Diagnosis not present

## 2020-05-24 DIAGNOSIS — E1122 Type 2 diabetes mellitus with diabetic chronic kidney disease: Secondary | ICD-10-CM | POA: Diagnosis present

## 2020-05-24 DIAGNOSIS — R1031 Right lower quadrant pain: Secondary | ICD-10-CM | POA: Diagnosis not present

## 2020-05-24 DIAGNOSIS — E1165 Type 2 diabetes mellitus with hyperglycemia: Secondary | ICD-10-CM

## 2020-05-24 DIAGNOSIS — Z96641 Presence of right artificial hip joint: Secondary | ICD-10-CM | POA: Diagnosis present

## 2020-05-24 DIAGNOSIS — G4733 Obstructive sleep apnea (adult) (pediatric): Secondary | ICD-10-CM | POA: Diagnosis present

## 2020-05-24 DIAGNOSIS — Z8673 Personal history of transient ischemic attack (TIA), and cerebral infarction without residual deficits: Secondary | ICD-10-CM

## 2020-05-24 DIAGNOSIS — R0789 Other chest pain: Secondary | ICD-10-CM | POA: Diagnosis not present

## 2020-05-24 DIAGNOSIS — Q250 Patent ductus arteriosus: Secondary | ICD-10-CM | POA: Diagnosis not present

## 2020-05-24 DIAGNOSIS — Z79899 Other long term (current) drug therapy: Secondary | ICD-10-CM

## 2020-05-24 DIAGNOSIS — M96841 Postprocedural hematoma of a musculoskeletal structure following other procedure: Secondary | ICD-10-CM | POA: Diagnosis not present

## 2020-05-24 DIAGNOSIS — E46 Unspecified protein-calorie malnutrition: Secondary | ICD-10-CM | POA: Diagnosis not present

## 2020-05-24 DIAGNOSIS — R4182 Altered mental status, unspecified: Secondary | ICD-10-CM | POA: Diagnosis not present

## 2020-05-24 DIAGNOSIS — J90 Pleural effusion, not elsewhere classified: Secondary | ICD-10-CM | POA: Diagnosis not present

## 2020-05-24 DIAGNOSIS — Z20822 Contact with and (suspected) exposure to covid-19: Secondary | ICD-10-CM | POA: Diagnosis present

## 2020-05-24 DIAGNOSIS — R0989 Other specified symptoms and signs involving the circulatory and respiratory systems: Secondary | ICD-10-CM | POA: Diagnosis not present

## 2020-05-24 DIAGNOSIS — J9811 Atelectasis: Secondary | ICD-10-CM | POA: Diagnosis not present

## 2020-05-24 DIAGNOSIS — N184 Chronic kidney disease, stage 4 (severe): Secondary | ICD-10-CM | POA: Diagnosis present

## 2020-05-24 DIAGNOSIS — Z7982 Long term (current) use of aspirin: Secondary | ICD-10-CM

## 2020-05-24 DIAGNOSIS — S7011XA Contusion of right thigh, initial encounter: Secondary | ICD-10-CM | POA: Diagnosis not present

## 2020-05-24 DIAGNOSIS — E785 Hyperlipidemia, unspecified: Secondary | ICD-10-CM | POA: Diagnosis present

## 2020-05-24 DIAGNOSIS — R471 Dysarthria and anarthria: Secondary | ICD-10-CM | POA: Diagnosis not present

## 2020-05-24 DIAGNOSIS — R571 Hypovolemic shock: Secondary | ICD-10-CM | POA: Diagnosis not present

## 2020-05-24 DIAGNOSIS — J9690 Respiratory failure, unspecified, unspecified whether with hypoxia or hypercapnia: Secondary | ICD-10-CM

## 2020-05-24 DIAGNOSIS — J189 Pneumonia, unspecified organism: Secondary | ICD-10-CM | POA: Diagnosis not present

## 2020-05-24 DIAGNOSIS — M109 Gout, unspecified: Secondary | ICD-10-CM | POA: Diagnosis present

## 2020-05-24 DIAGNOSIS — R001 Bradycardia, unspecified: Secondary | ICD-10-CM | POA: Diagnosis not present

## 2020-05-24 DIAGNOSIS — Z01818 Encounter for other preprocedural examination: Secondary | ICD-10-CM

## 2020-05-24 DIAGNOSIS — Z91011 Allergy to milk products: Secondary | ICD-10-CM

## 2020-05-24 DIAGNOSIS — I10 Essential (primary) hypertension: Secondary | ICD-10-CM | POA: Diagnosis not present

## 2020-05-24 DIAGNOSIS — I6523 Occlusion and stenosis of bilateral carotid arteries: Secondary | ICD-10-CM | POA: Diagnosis not present

## 2020-05-24 DIAGNOSIS — S301XXA Contusion of abdominal wall, initial encounter: Secondary | ICD-10-CM | POA: Diagnosis not present

## 2020-05-24 DIAGNOSIS — Z885 Allergy status to narcotic agent status: Secondary | ICD-10-CM

## 2020-05-24 DIAGNOSIS — I517 Cardiomegaly: Secondary | ICD-10-CM | POA: Diagnosis not present

## 2020-05-24 DIAGNOSIS — Z833 Family history of diabetes mellitus: Secondary | ICD-10-CM

## 2020-05-24 DIAGNOSIS — Z888 Allergy status to other drugs, medicaments and biological substances status: Secondary | ICD-10-CM

## 2020-05-24 DIAGNOSIS — R0602 Shortness of breath: Secondary | ICD-10-CM | POA: Diagnosis not present

## 2020-05-24 DIAGNOSIS — R062 Wheezing: Secondary | ICD-10-CM | POA: Diagnosis not present

## 2020-05-24 DIAGNOSIS — I6782 Cerebral ischemia: Secondary | ICD-10-CM | POA: Diagnosis not present

## 2020-05-24 DIAGNOSIS — Z978 Presence of other specified devices: Secondary | ICD-10-CM

## 2020-05-24 DIAGNOSIS — I63542 Cerebral infarction due to unspecified occlusion or stenosis of left cerebellar artery: Secondary | ICD-10-CM | POA: Diagnosis not present

## 2020-05-24 DIAGNOSIS — M7981 Nontraumatic hematoma of soft tissue: Secondary | ICD-10-CM | POA: Diagnosis not present

## 2020-05-24 HISTORY — PX: CORONARY STENT INTERVENTION: CATH118234

## 2020-05-24 HISTORY — DX: Non-ST elevation (NSTEMI) myocardial infarction: I21.4

## 2020-05-24 HISTORY — PX: LEFT HEART CATH AND CORONARY ANGIOGRAPHY: CATH118249

## 2020-05-24 LAB — TROPONIN I (HIGH SENSITIVITY)
Troponin I (High Sensitivity): 350 ng/L (ref <18)
Troponin I (High Sensitivity): 467 ng/L (ref <18)
Troponin I (High Sensitivity): 682 ng/L (ref <18)
Troponin I (High Sensitivity): 780 ng/L (ref <18)

## 2020-05-24 LAB — CBC
HCT: 37.5 % (ref 36.0–46.0)
Hemoglobin: 12.2 g/dL (ref 12.0–15.0)
MCH: 28.8 pg (ref 26.0–34.0)
MCHC: 32.5 g/dL (ref 30.0–36.0)
MCV: 88.4 fL (ref 80.0–100.0)
Platelets: 320 10*3/uL (ref 150–400)
RBC: 4.24 MIL/uL (ref 3.87–5.11)
RDW: 14.9 % (ref 11.5–15.5)
WBC: 7.2 10*3/uL (ref 4.0–10.5)
nRBC: 0 % (ref 0.0–0.2)

## 2020-05-24 LAB — SARS CORONAVIRUS 2 BY RT PCR (HOSPITAL ORDER, PERFORMED IN ~~LOC~~ HOSPITAL LAB): SARS Coronavirus 2: NEGATIVE

## 2020-05-24 LAB — MRSA PCR SCREENING: MRSA by PCR: NEGATIVE

## 2020-05-24 LAB — HEMOGLOBIN A1C
Hgb A1c MFr Bld: 7.3 % — ABNORMAL HIGH (ref 4.8–5.6)
Mean Plasma Glucose: 162.81 mg/dL

## 2020-05-24 LAB — BRAIN NATRIURETIC PEPTIDE: B Natriuretic Peptide: 213 pg/mL — ABNORMAL HIGH (ref 0.0–100.0)

## 2020-05-24 LAB — BASIC METABOLIC PANEL
Anion gap: 12 (ref 5–15)
BUN: 16 mg/dL (ref 8–23)
CO2: 26 mmol/L (ref 22–32)
Calcium: 8.8 mg/dL — ABNORMAL LOW (ref 8.9–10.3)
Chloride: 101 mmol/L (ref 98–111)
Creatinine, Ser: 1.15 mg/dL — ABNORMAL HIGH (ref 0.44–1.00)
GFR calc Af Amer: 56 mL/min — ABNORMAL LOW (ref >60)
GFR calc non Af Amer: 48 mL/min — ABNORMAL LOW (ref >60)
Glucose, Bld: 173 mg/dL — ABNORMAL HIGH (ref 70–99)
Potassium: 3.4 mmol/L — ABNORMAL LOW (ref 3.5–5.1)
Sodium: 139 mmol/L (ref 135–145)

## 2020-05-24 LAB — GLUCOSE, CAPILLARY
Glucose-Capillary: 162 mg/dL — ABNORMAL HIGH (ref 70–99)
Glucose-Capillary: 226 mg/dL — ABNORMAL HIGH (ref 70–99)
Glucose-Capillary: 245 mg/dL — ABNORMAL HIGH (ref 70–99)

## 2020-05-24 LAB — POCT ACTIVATED CLOTTING TIME
Activated Clotting Time: 400 seconds
Activated Clotting Time: 147 seconds

## 2020-05-24 SURGERY — LEFT HEART CATH AND CORONARY ANGIOGRAPHY
Anesthesia: LOCAL

## 2020-05-24 MED ORDER — SODIUM CHLORIDE 0.9% FLUSH: 3.0000 mL | INTRAVENOUS | Status: DC | PRN

## 2020-05-24 MED ORDER — ACETAMINOPHEN 325 MG PO TABS
650.0000 mg | ORAL_TABLET | ORAL | Status: DC | PRN
  Administered 2020-05-24 – 2020-05-25 (×2): 650 mg via ORAL
  Filled 2020-05-24 (×3): qty 2

## 2020-05-24 MED ORDER — TICAGRELOR 90 MG PO TABS
ORAL_TABLET | ORAL | Status: AC
  Filled 2020-05-24: qty 1

## 2020-05-24 MED ORDER — FUROSEMIDE 10 MG/ML IJ SOLN
INTRAMUSCULAR | Status: AC
  Filled 2020-05-24: qty 4

## 2020-05-24 MED ORDER — NITROGLYCERIN 0.4 MG SL SUBL
0.4000 mg | SUBLINGUAL_TABLET | SUBLINGUAL | Status: DC | PRN
  Administered 2020-05-24: 0.4 mg via SUBLINGUAL
  Filled 2020-05-24 (×2): qty 1

## 2020-05-24 MED ORDER — HEPARIN (PORCINE) IN NACL 1000-0.9 UT/500ML-% IV SOLN
INTRAVENOUS | Status: AC
  Filled 2020-05-24: qty 1000

## 2020-05-24 MED ORDER — SODIUM CHLORIDE 0.9 % WEIGHT BASED INFUSION
3.0000 mL/kg/h | INTRAVENOUS | Status: AC
  Administered 2020-05-24: 3 mL/kg/h via INTRAVENOUS

## 2020-05-24 MED ORDER — TICAGRELOR 90 MG PO TABS
90.0000 mg | ORAL_TABLET | Freq: Two times a day (BID) | ORAL | Status: DC
  Administered 2020-05-24 – 2020-05-25 (×3): 90 mg via ORAL
  Filled 2020-05-24 (×3): qty 1

## 2020-05-24 MED ORDER — HYDRALAZINE HCL 20 MG/ML IJ SOLN
INTRAMUSCULAR | Status: DC | PRN
  Administered 2020-05-24: 10 mg via INTRAVENOUS

## 2020-05-24 MED ORDER — ALBUTEROL SULFATE HFA 108 (90 BASE) MCG/ACT IN AERS: 2.0000 | INHALATION_SPRAY | Freq: Four times a day (QID) | RESPIRATORY_TRACT | Status: DC | PRN

## 2020-05-24 MED ORDER — ALBUTEROL SULFATE (2.5 MG/3ML) 0.083% IN NEBU: 2.5000 mg | INHALATION_SOLUTION | Freq: Four times a day (QID) | RESPIRATORY_TRACT | Status: DC | PRN

## 2020-05-24 MED ORDER — SODIUM CHLORIDE 0.9% FLUSH
3.0000 mL | Freq: Once | INTRAVENOUS | Status: AC
  Administered 2020-05-24: 3 mL via INTRAVENOUS

## 2020-05-24 MED ORDER — FENTANYL CITRATE (PF) 100 MCG/2ML IJ SOLN
INTRAMUSCULAR | Status: DC | PRN
  Administered 2020-05-24: 25 ug via INTRAVENOUS

## 2020-05-24 MED ORDER — HEPARIN (PORCINE) 25000 UT/250ML-% IV SOLN
1000.0000 [IU]/h | INTRAVENOUS | Status: DC
  Administered 2020-05-24: 1000 [IU]/h via INTRAVENOUS
  Filled 2020-05-24: qty 250

## 2020-05-24 MED ORDER — NITROGLYCERIN 1 MG/10 ML FOR IR/CATH LAB
INTRA_ARTERIAL | Status: AC
  Filled 2020-05-24: qty 10

## 2020-05-24 MED ORDER — MIDAZOLAM HCL 2 MG/2ML IJ SOLN
INTRAMUSCULAR | Status: DC | PRN
  Administered 2020-05-24: 1 mg via INTRAVENOUS

## 2020-05-24 MED ORDER — HEPARIN BOLUS VIA INFUSION
4000.0000 [IU] | Freq: Once | INTRAVENOUS | Status: AC
  Administered 2020-05-24: 4000 [IU] via INTRAVENOUS
  Filled 2020-05-24: qty 4000

## 2020-05-24 MED ORDER — POTASSIUM CHLORIDE CRYS ER 10 MEQ PO TBCR
20.0000 meq | EXTENDED_RELEASE_TABLET | Freq: Every day | ORAL | Status: DC
  Administered 2020-05-24 – 2020-06-01 (×8): 20 meq via ORAL
  Filled 2020-05-24 (×10): qty 2

## 2020-05-24 MED ORDER — HYDRALAZINE HCL 20 MG/ML IJ SOLN
10.0000 mg | INTRAMUSCULAR | Status: AC | PRN
  Administered 2020-05-24: 10 mg via INTRAVENOUS
  Filled 2020-05-24: qty 1

## 2020-05-24 MED ORDER — NITROGLYCERIN IN D5W 200-5 MCG/ML-% IV SOLN
0.0000 ug/min | INTRAVENOUS | Status: DC
  Administered 2020-05-24 (×2): 5 ug/min via INTRAVENOUS
  Filled 2020-05-24 (×2): qty 250

## 2020-05-24 MED ORDER — LABETALOL HCL 5 MG/ML IV SOLN
10.0000 mg | INTRAVENOUS | Status: AC | PRN
  Administered 2020-05-24 (×2): 10 mg via INTRAVENOUS
  Filled 2020-05-24: qty 4

## 2020-05-24 MED ORDER — LABETALOL HCL 5 MG/ML IV SOLN
INTRAVENOUS | Status: AC
  Filled 2020-05-24: qty 4

## 2020-05-24 MED ORDER — MIDAZOLAM HCL 2 MG/2ML IJ SOLN
INTRAMUSCULAR | Status: AC
  Filled 2020-05-24: qty 2

## 2020-05-24 MED ORDER — SODIUM CHLORIDE 0.9% FLUSH: 3.0000 mL | Freq: Two times a day (BID) | INTRAVENOUS | Status: DC

## 2020-05-24 MED ORDER — MOMETASONE FURO-FORMOTEROL FUM 200-5 MCG/ACT IN AERO
2.0000 | INHALATION_SPRAY | Freq: Two times a day (BID) | RESPIRATORY_TRACT | Status: DC
  Administered 2020-05-24 – 2020-06-01 (×10): 2 via RESPIRATORY_TRACT
  Filled 2020-05-24 (×2): qty 8.8

## 2020-05-24 MED ORDER — BIVALIRUDIN TRIFLUOROACETATE 250 MG IV SOLR
INTRAVENOUS | Status: AC
  Filled 2020-05-24: qty 250

## 2020-05-24 MED ORDER — DIPHENHYDRAMINE HCL 50 MG/ML IJ SOLN
25.0000 mg | Freq: Once | INTRAMUSCULAR | Status: AC
  Administered 2020-05-24: 25 mg via INTRAVENOUS
  Filled 2020-05-24: qty 1

## 2020-05-24 MED ORDER — SODIUM CHLORIDE 0.9 % IV SOLN: 250.0000 mL | INTRAVENOUS | Status: DC | PRN

## 2020-05-24 MED ORDER — SODIUM CHLORIDE 0.9 % IV SOLN: INTRAVENOUS | Status: DC

## 2020-05-24 MED ORDER — ONDANSETRON HCL 4 MG/2ML IJ SOLN: 4.0000 mg | Freq: Four times a day (QID) | INTRAMUSCULAR | Status: DC | PRN

## 2020-05-24 MED ORDER — SODIUM CHLORIDE 0.9 % WEIGHT BASED INFUSION
1.0000 mL/kg/h | INTRAVENOUS | Status: DC
  Administered 2020-05-24: 1 mL/kg/h via INTRAVENOUS

## 2020-05-24 MED ORDER — BIVALIRUDIN BOLUS VIA INFUSION - CUPID
INTRAVENOUS | Status: DC | PRN
  Administered 2020-05-24: 81 mg via INTRAVENOUS

## 2020-05-24 MED ORDER — TICAGRELOR 90 MG PO TABS
ORAL_TABLET | ORAL | Status: DC | PRN
  Administered 2020-05-24: 180 mg via ORAL

## 2020-05-24 MED ORDER — CLOPIDOGREL BISULFATE 75 MG PO TABS: 75.0000 mg | ORAL_TABLET | Freq: Every day | ORAL | Status: DC

## 2020-05-24 MED ORDER — ONDANSETRON HCL 4 MG/2ML IJ SOLN
4.0000 mg | Freq: Four times a day (QID) | INTRAMUSCULAR | Status: DC | PRN
  Administered 2020-05-27: 4 mg via INTRAVENOUS
  Filled 2020-05-24: qty 2

## 2020-05-24 MED ORDER — ATORVASTATIN CALCIUM 80 MG PO TABS
80.0000 mg | ORAL_TABLET | Freq: Every day | ORAL | Status: DC
  Administered 2020-05-24 – 2020-05-26 (×3): 80 mg via ORAL
  Filled 2020-05-24 (×3): qty 1

## 2020-05-24 MED ORDER — HEPARIN (PORCINE) IN NACL 1000-0.9 UT/500ML-% IV SOLN
INTRAVENOUS | Status: DC | PRN
  Administered 2020-05-24 (×2): 500 mL

## 2020-05-24 MED ORDER — CHLORHEXIDINE GLUCONATE CLOTH 2 % EX PADS
6.0000 | MEDICATED_PAD | Freq: Every day | CUTANEOUS | Status: DC
  Administered 2020-05-29 (×2): 6 via TOPICAL

## 2020-05-24 MED ORDER — SODIUM CHLORIDE 0.9 % IV SOLN
INTRAVENOUS | Status: DC | PRN
  Administered 2020-05-24: 1.75 mg/kg/h via INTRAVENOUS

## 2020-05-24 MED ORDER — SODIUM CHLORIDE 0.9% FLUSH
3.0000 mL | Freq: Two times a day (BID) | INTRAVENOUS | Status: DC
  Administered 2020-05-25 – 2020-05-31 (×12): 3 mL via INTRAVENOUS

## 2020-05-24 MED ORDER — FENTANYL CITRATE (PF) 100 MCG/2ML IJ SOLN
INTRAMUSCULAR | Status: AC
  Filled 2020-05-24: qty 2

## 2020-05-24 MED ORDER — ASPIRIN 81 MG PO CHEW: 81.0000 mg | CHEWABLE_TABLET | Freq: Every day | ORAL | Status: DC

## 2020-05-24 MED ORDER — NITROGLYCERIN 1 MG/10 ML FOR IR/CATH LAB
INTRA_ARTERIAL | Status: DC | PRN
  Administered 2020-05-24 (×3): 200 ug via INTRACORONARY

## 2020-05-24 MED ORDER — ATROPINE SULFATE 1 MG/10ML IJ SOSY
PREFILLED_SYRINGE | INTRAMUSCULAR | Status: AC
  Filled 2020-05-24: qty 10

## 2020-05-24 MED ORDER — ASPIRIN 81 MG PO CHEW
81.0000 mg | CHEWABLE_TABLET | Freq: Every day | ORAL | Status: DC
  Administered 2020-05-25 – 2020-05-26 (×2): 81 mg via ORAL
  Filled 2020-05-24 (×2): qty 1

## 2020-05-24 MED ORDER — FUROSEMIDE 10 MG/ML IJ SOLN
INTRAMUSCULAR | Status: DC | PRN
  Administered 2020-05-24: 40 mg via INTRAVENOUS

## 2020-05-24 MED ORDER — INSULIN ASPART 100 UNIT/ML ~~LOC~~ SOLN
0.0000 [IU] | Freq: Three times a day (TID) | SUBCUTANEOUS | Status: DC
  Administered 2020-05-24: 3 [IU] via SUBCUTANEOUS
  Administered 2020-05-25: 2 [IU] via SUBCUTANEOUS
  Administered 2020-05-25 – 2020-05-26 (×3): 1 [IU] via SUBCUTANEOUS
  Administered 2020-05-27: 2 [IU] via SUBCUTANEOUS
  Administered 2020-05-27: 3 [IU] via SUBCUTANEOUS

## 2020-05-24 MED ORDER — ASPIRIN 81 MG PO CHEW
81.0000 mg | CHEWABLE_TABLET | ORAL | Status: AC
  Administered 2020-05-24: 81 mg via ORAL
  Filled 2020-05-24: qty 1

## 2020-05-24 MED ORDER — METHYLPREDNISOLONE SODIUM SUCC 125 MG IJ SOLR
125.0000 mg | Freq: Once | INTRAMUSCULAR | Status: AC
  Administered 2020-05-24: 125 mg via INTRAVENOUS
  Filled 2020-05-24: qty 2

## 2020-05-24 MED ORDER — HYDRALAZINE HCL 20 MG/ML IJ SOLN
INTRAMUSCULAR | Status: AC
  Filled 2020-05-24: qty 1

## 2020-05-24 MED ORDER — CARVEDILOL 6.25 MG PO TABS
6.2500 mg | ORAL_TABLET | Freq: Two times a day (BID) | ORAL | Status: DC
  Administered 2020-05-24 – 2020-05-26 (×5): 6.25 mg via ORAL
  Filled 2020-05-24 (×5): qty 1

## 2020-05-24 MED ORDER — LIDOCAINE HCL (PF) 1 % IJ SOLN
INTRAMUSCULAR | Status: AC
  Filled 2020-05-24: qty 30

## 2020-05-24 MED ORDER — ASPIRIN EC 81 MG PO TBEC: 81.0000 mg | DELAYED_RELEASE_TABLET | Freq: Every day | ORAL | Status: DC

## 2020-05-24 MED ORDER — FENTANYL CITRATE (PF) 100 MCG/2ML IJ SOLN
INTRAMUSCULAR | Status: DC | PRN
  Administered 2020-05-24: 50 ug via INTRAVENOUS

## 2020-05-24 SURGICAL SUPPLY — 21 items
BALLN EMERGE MR 2.5X15 (BALLOONS) ×2
BALLN ~~LOC~~ EMERGE MR 3.75X12 (BALLOONS) ×2
BALLN ~~LOC~~ EMERGE MR 4.0X12 (BALLOONS) ×2
BALLOON EMERGE MR 2.5X15 (BALLOONS) IMPLANT
BALLOON ~~LOC~~ EMERGE MR 3.75X12 (BALLOONS) IMPLANT
BALLOON ~~LOC~~ EMERGE MR 4.0X12 (BALLOONS) IMPLANT
CATH INFINITI 5FR MULTPACK ANG (CATHETERS) ×1 IMPLANT
CATH LAUNCHER 6FR EBU 3.75 (CATHETERS) ×1 IMPLANT
CATH OPTICROSS HD (CATHETERS) ×1 IMPLANT
KIT ENCORE 26 ADVANTAGE (KITS) ×1 IMPLANT
KIT HEART LEFT (KITS) ×2 IMPLANT
KIT HEMO VALVE WATCHDOG (MISCELLANEOUS) ×1 IMPLANT
PACK CARDIAC CATHETERIZATION (CUSTOM PROCEDURE TRAY) ×2 IMPLANT
SHEATH PINNACLE 5F 10CM (SHEATH) ×1 IMPLANT
SHEATH PINNACLE 6F 10CM (SHEATH) ×1 IMPLANT
SLED PULL BACK IVUS (MISCELLANEOUS) ×1 IMPLANT
STENT RESOLUTE ONYX 3.5X22 (Permanent Stent) ×1 IMPLANT
SYR MEDRAD MARK 7 150ML (SYRINGE) ×2 IMPLANT
TRANSDUCER W/STOPCOCK (MISCELLANEOUS) ×2 IMPLANT
WIRE ASAHI PROWATER 180CM (WIRE) ×1 IMPLANT
WIRE EMERALD 3MM-J .035X150CM (WIRE) ×1 IMPLANT

## 2020-05-24 NOTE — Progress Notes (Signed)
Sheath pulled at 1742. Pressure held for 67mins, vitals stable. Pt advised of care for site.

## 2020-05-24 NOTE — Progress Notes (Signed)
ANTICOAGULATION CONSULT NOTE - Initial Consult  Pharmacy Consult for heparin Indication: chest pain/ACS  Allergies  Allergen Reactions   Morphine And Related Hives and Nausea And Vomiting   Penicillins Hives    Has patient had a PCN reaction causing immediate rash, facial/tongue/throat swelling, SOB or lightheadedness with hypotension: Yes Has patient had a PCN reaction causing severe rash involving mucus membranes or skin necrosis: No Has patient had a PCN reaction that required hospitalization: No Has patient had a PCN reaction occurring within the last 10 years: No If all of the above answers are "NO", then may proceed with Cephalosporin use.   Percocet [Oxycodone-Acetaminophen] Nausea And Vomiting   Ace Inhibitors Nausea And Vomiting and Cough   Iodinated Diagnostic Agents Nausea And Vomiting        Iodine Itching   Etodolac Nausea And Vomiting   Tramadol Nausea And Vomiting   Vicodin [Hydrocodone-Acetaminophen] Nausea And Vomiting    Patient Measurements: Height: 5\' 4"  (162.6 cm) Weight: 108 kg (238 lb) IBW/kg (Calculated) : 54.7 Heparin Dosing Weight: 80.2kg  Vital Signs: Temp: 98.3 F (36.8 C) (08/10 0348) Temp Source: Oral (08/10 0348) BP: 196/98 (08/10 0700) Pulse Rate: 62 (08/10 0708)  Labs: Recent Labs    05/24/20 0404  HGB 12.2  HCT 37.5  PLT 320  CREATININE 1.15*  TROPONINIHS 350*    Estimated Creatinine Clearance: 54.6 mL/min (A) (by C-G formula based on SCr of 1.15 mg/dL (H)).   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)    Arthritis    "all over"   Backache, unspecified    Body mass index 40.0-44.9, adult (HCC)    Carpal tunnel syndrome    Dizziness and giddiness    Esophageal reflux    Generalized pain    Headache    "had alot til I had pituitary tumor removed"   Heart murmur    Other abnormal glucose    Other and unspecified  hyperlipidemia    Other malaise and fatigue    Other postprocedural status(V45.89)    Other symptoms involving cardiovascular system    Sleep apnea    "did test; they wanted me to get a CPAP but I never did get it" (04/14/2015) Does use inhaler at night (01 2021)   Stroke ([place]) 06/2003; 02/2010   "minor; minor" ; denies residual on 04/14/2015   Unspecified disorder of the pituitary gland and its hypothalamic control    Unspecified essential hypertension    Unspecified visual disturbance     Medications:  Infusions:   heparin     nitroGLYCERIN      Assessment: 18 yof presented to the ED with CP. Troponin is elevated and now starting IV heparin. CBC is WNL and she is not on anticoagulation PTA.   Goal of Therapy:  Heparin level 0.3-0.7 units/ml Monitor platelets by anticoagulation protocol: Yes   Plan:  Heparin bolus 4000 units IV x 1 Heparin gtt 1000 units/hr Check an 8 hr heparin level Daily heparin level and CBC  Sherry Fitzpatrick, Rande Lawman 05/24/2020,7:29 AM

## 2020-05-24 NOTE — ED Notes (Signed)
Pt transported to cath lab at this time.

## 2020-05-24 NOTE — H&P (Signed)
Sherry Fitzpatrick is an 69 y.o. female.   Chief Complaint: Chest pain HPI: Patient is 69 year old female with past medical history significant for hypertension, hyperlipidemia, type 2 diabetes mellitus, morbid obesity, obstructive sleep apnea, degenerative joint disease, history of TIA/CVA in the past, came to ER by EMS complaining of retrosternal chest pain described as pressure tightness which woke her up around 3:30 AM states pain was 10/10 radiating to left shoulder and arm associated with diaphoresis and mild shortness of breath received aspirin 9 sublingual nitro with partial relief EKG done in the ED showed normal sinus rhythm with ST-T wave changes in inferolateral leads and was noted to have minimally elevated high-sensitivity troponin high.  Patient was started on heparin and IV nitro with marked improvement in her chest pain.  Patient denies any palpitation lightheadedness or syncope denies such episodes of severe chest pain in the past.  Patient denies any cough fever chills.  Past Medical History:  Diagnosis Date  . Alterations of sensations, late effect of cerebrovascular disease(438.6)   . Arthritis    "all over"  . Backache, unspecified   . Body mass index 40.0-44.9, adult ([place])   . Carpal tunnel syndrome   . Dizziness and giddiness   . Esophageal reflux   . Generalized pain   . Headache    "had alot til I had pituitary tumor removed"  . Heart murmur   . Other abnormal glucose   . Other and unspecified hyperlipidemia   . Other malaise and fatigue   . Other postprocedural status(V45.89)   . Other symptoms involving cardiovascular system   . Sleep apnea    "did test; they wanted me to get a CPAP but I never did get it" (04/14/2015) Does use inhaler at night (01 2021)  . Stroke ([place]) 06/2003; 02/2010   "minor; minor" ; denies residual on 04/14/2015  . Unspecified disorder of the pituitary gland and its hypothalamic control   . Unspecified essential hypertension   . Unspecified  visual disturbance     Past Surgical History:  Procedure Laterality Date  . CARPAL TUNNEL RELEASE Left ~ 2014  . JOINT REPLACEMENT    . TOTAL HIP ARTHROPLASTY Right 11/06/2010  . TRANSPHENOIDAL / TRANSNASAL HYPOPHYSECTOMY / RESECTION PITUITARY TUMOR  06/13/2012  . TUBAL LIGATION  1974    Family History  Problem Relation Age of Onset  . Heart failure Mother   . Hypertension Mother   . Diabetes Father   . Hypertension Sister   . Heart failure Sister   . Hypertension Daughter    Social History:  reports that she has never smoked. She has never used smokeless tobacco. She reports current alcohol use. She reports that she does not use drugs.  Allergies:  Allergies  Allergen Reactions  . Morphine And Related Hives and Nausea And Vomiting  . Penicillins Hives    Has patient had a PCN reaction causing immediate rash, facial/tongue/throat swelling, SOB or lightheadedness with hypotension: Yes Has patient had a PCN reaction causing severe rash involving mucus membranes or skin necrosis: No Has patient had a PCN reaction that required hospitalization: No Has patient had a PCN reaction occurring within the last 10 years: No If all of the above answers are "NO", then may proceed with Cephalosporin use.  Marland Kitchen Percocet [Oxycodone-Acetaminophen] Nausea And Vomiting  . Ace Inhibitors Nausea And Vomiting and Cough  . Iodinated Diagnostic Agents Nausea And Vomiting       . Iodine Itching  . Etodolac Nausea And Vomiting  .  Tramadol Nausea And Vomiting  . Vicodin [Hydrocodone-Acetaminophen] Nausea And Vomiting    (Not in a hospital admission)   Results for orders placed or performed during the hospital encounter of 05/24/20 (from the past 48 hour(s))  Basic metabolic panel     Status: Abnormal   Collection Time: 05/24/20  4:04 AM  Result Value Ref Range   Sodium 139 135 - 145 mmol/L   Potassium 3.4 (L) 3.5 - 5.1 mmol/L   Chloride 101 98 - 111 mmol/L   CO2 26 22 - 32 mmol/L   Glucose, Bld  173 (H) 70 - 99 mg/dL    Comment: Glucose reference range applies only to samples taken after fasting for at least 8 hours.   BUN 16 8 - 23 mg/dL   Creatinine, Ser 1.15 (H) 0.44 - 1.00 mg/dL   Calcium 8.8 (L) 8.9 - 10.3 mg/dL   GFR calc non Af Amer 48 (L) >60 mL/min   GFR calc Af Amer 56 (L) >60 mL/min   Anion gap 12 5 - 15    Comment: Performed at [facility] [address] [place] [address]  CBC     Status: None   Collection Time: 05/24/20  4:04 AM  Result Value Ref Range   WBC 7.2 4.0 - 10.5 K/uL   RBC 4.24 3.87 - 5.11 MIL/uL   Hemoglobin 12.2 12.0 - 15.0 g/dL   HCT 37.5 36 - 46 %   MCV 88.4 80.0 - 100.0 fL   MCH 28.8 26.0 - 34.0 pg   MCHC 32.5 30.0 - 36.0 g/dL   RDW 14.9 11.5 - 15.5 %   Platelets 320 150 - 400 K/uL   nRBC 0.0 0.0 - 0.2 %    Comment: Performed at [facility], [place] [address], [place], [place] [address]  Troponin I (High Sensitivity)     Status: Abnormal   Collection Time: 05/24/20  4:04 AM  Result Value Ref Range   Troponin I (High Sensitivity) 350 (HH) <18 ng/L    Comment: CRITICAL RESULT CALLED TO, READ BACK BY AND VERIFIED WITH: RN B OLDLAND @0525  05/24/20 BY S GEZAHEGN (NOTE) Elevated high sensitivity troponin I (hsTnI) values and significant  changes across serial measurements may suggest ACS but many other  chronic and acute conditions are known to elevate hsTnI results.  Refer to the Links section for chest pain algorithms and additional  guidance. Performed at [facility], [place] [address], [place], [place] [address]   Troponin I (High Sensitivity)     Status: Abnormal   Collection Time: 05/24/20  6:09 AM  Result Value Ref Range   Troponin I (High Sensitivity) 467 (HH) <18 ng/L    Comment: CRITICAL VALUE NOTED.  VALUE IS CONSISTENT WITH PREVIOUSLY REPORTED AND CALLED VALUE. (NOTE) Elevated high sensitivity troponin I (hsTnI) values and significant  changes across serial measurements may suggest ACS but many other   chronic and acute conditions are known to elevate hsTnI results.  Refer to the Links section for chest pain algorithms and additional  guidance. Performed at [facility], [place] [address] [place], [place] [address]    DG Chest 2 View  Result Date: 05/24/2020 CLINICAL DATA:  Chest pain.  Hypoxia. EXAM: CHEST - 2 VIEW COMPARISON:  Two-view chest x-ray 03/27/2020 FINDINGS: Cardiomegaly is stable. Patchy interstitial and airspace opacities are present bilaterally. No significant consolidation is present. Axial skeleton is unremarkable. IMPRESSION: 1. Stable cardiomegaly. 2. Patchy interstitial and  airspace opacities bilaterally. Differential diagnosis includes edema versus infection. Electronically Signed   By: San Morelle M.D.   On: 05/24/2020 04:48    Review of Systems  Constitutional: Positive for diaphoresis. Negative for chills and fever.  HENT: Negative for sore throat.   Eyes: Negative for discharge.  Respiratory: Positive for shortness of breath.   Cardiovascular: Positive for chest pain. Negative for palpitations and leg swelling.  Gastrointestinal: Negative for abdominal pain.  Endocrine: Negative for cold intolerance.  Genitourinary: Negative for difficulty urinating.  Neurological: Negative for dizziness and seizures.    Blood pressure (!) 196/98, pulse 62, temperature 98.3 F (36.8 C), temperature source Oral, resp. rate 17, height 5\' 4"  (1.626 m), weight 108 kg, SpO2 98 %. Physical Exam Constitutional:      Appearance: She is well-developed. She is obese.  HENT:     Head: Normocephalic and atraumatic.  Neck:     Vascular: No JVD.     Trachea: No tracheal deviation.  Cardiovascular:     Rate and Rhythm: Normal rate and regular rhythm.     Heart sounds: Gallop present. S4 sounds present.   Pulmonary:     Effort: Pulmonary effort is normal. No accessory muscle usage.     Breath sounds: Examination of the right-lower field reveals decreased breath sounds.  Examination of the left-lower field reveals decreased breath sounds. Decreased breath sounds present. No wheezing, rhonchi or rales.  Abdominal:     General: Bowel sounds are normal.     Palpations: Abdomen is soft. There is no mass.     Tenderness: There is no guarding.  Musculoskeletal:     Cervical back: Normal range of motion and neck supple.     Right lower leg: No tenderness. No edema.     Left lower leg: No tenderness. No edema.  Skin:    General: Skin is warm and dry.  Neurological:     General: No focal deficit present.     Mental Status: She is oriented to person, place, and time.      Assessment/Plan Acute non-STEMI Uncontrolled hypertension Type 2 diabetes mellitus Hyperlipidemia History of TIA/CVA Morbid obesity Obstructive sleep apnea History of gouty arthritis Plan Discussed with patient at length regarding left cardiac catheterization possible PTCA stenting its risk and benefits i.e. death MI stroke need for emergency CABG local vascular complications risk of restenosis etc. and consents for PCI. Charolette Forward, MD 05/24/2020, 8:18 AM

## 2020-05-24 NOTE — ED Triage Notes (Signed)
Per EMS, pt was at home and woke from sleep w/ sharp,burning non-radiating substernal chest pain. Pt took 324 ASA prior to EMS arriving, after 3 nitro her pain decreased to 7/10.    Pt has hx of COPD, not on home O2 however when she moves her O2 dropped but quickly rebound.    20G Right hand

## 2020-05-24 NOTE — ED Notes (Signed)
Consent obtained and at bedside. Cath lab defib pads applied. PT resting comfortable at this time.

## 2020-05-24 NOTE — Interval H&P Note (Signed)
Cath Lab Visit (complete for each Cath Lab visit)  Clinical Evaluation Leading to the Procedure:   ACS: Yes.    Non-ACS:    Anginal Classification: CCS IV  Anti-ischemic medical therapy: Maximal Therapy (2 or more classes of medications)  Non-Invasive Test Results: No non-invasive testing performed  Prior CABG: No previous CABG      History and Physical Interval Note:  05/24/2020 12:09 PM  Sherry Fitzpatrick  has presented today for surgery, with the diagnosis of nstemi.  The various methods of treatment have been discussed with the patient and family. After consideration of risks, benefits and other options for treatment, the patient has consented to  Procedure(s): LEFT HEART CATH AND CORONARY ANGIOGRAPHY (N/A) as a surgical intervention.  The patient's history has been reviewed, patient examined, no change in status, stable for surgery.  I have reviewed the patient's chart and labs.  Questions were answered to the patient's satisfaction.     Charolette Forward

## 2020-05-24 NOTE — ED Notes (Signed)
Paged stemi MD Per Pa kelly

## 2020-05-24 NOTE — ED Provider Notes (Addendum)
[facility] EMERGENCY DEPARTMENT Provider Note   CSN: [id number] Arrival date & time: 05/24/20  0343     History Chief Complaint  Patient presents with   Chest Pain    Sherry Fitzpatrick is a 69 y.o. female with history of TIA, COPD, obesity, HLD, HTN, dCHF EF 60% who presents with chest pain. She woke up from her sleep around 3:30 AM with chest pain. It was sharp, severe and radiated to her L arm. She reports associated headache, SOB, and diaphoresis. Her daughter called EMS. She was given ASA and 3 nitro with a decrease in her chest pain to 7/10. She has been waiting in the waiting room for about 3 hours and states that now her pain is mild and more like a tightness. She still feels SOB. She denies hx of CAD and has never had a cath. She was previously admitted in January for chest pain and underwent coronary CTA which showed moderate risk due to a possible lesion in the LAD. Aggressive medical therapy was indicated as trops were negative and EKG unchanged. Her cardiologist is Dr. Terrence Dupont. She states she is under a lot of stress because her 58 year old grandchild will not let her rest during the day. She has been taking all her meds as prescribed. No fever, significant cough, abdominal pain, leg pain or swelling.    HPI     Past Medical History:  Diagnosis Date   Alterations of sensations, late effect of cerebrovascular disease(438.6)    Arthritis    "all over"   Backache, unspecified    Body mass index 40.0-44.9, adult (HCC)    Carpal tunnel syndrome    Dizziness and giddiness    Esophageal reflux    Generalized pain    Headache    "had alot til I had pituitary tumor removed"   Heart murmur    Other abnormal glucose    Other and unspecified hyperlipidemia    Other malaise and fatigue    Other postprocedural status(V45.89)    Other symptoms involving cardiovascular system    Sleep apnea    "did test; they wanted me to get a CPAP but I never  did get it" (04/14/2015) Does use inhaler at night (01 2021)   Stroke ([place]) 06/2003; 02/2010   "minor; minor" ; denies residual on 04/14/2015   Unspecified disorder of the pituitary gland and its hypothalamic control    Unspecified essential hypertension    Unspecified visual disturbance     Patient Active Problem List   Diagnosis Date Noted   Hypertensive encephalopathy 03/28/2020   Gout attack 03/28/2020   Stroke-like symptoms 03/27/2020   Normocytic anemia 03/27/2020   History of CVA (cerebrovascular accident) 03/27/2020   Acute respiratory failure with hypoxia ([place]) 11/22/2019   COPD with acute exacerbation ([place]) 11/22/2019   Acute asthma exacerbation 11/22/2019   Morbid obesity ([place]) 10/29/2019   CKD (chronic kidney disease), stage III 10/29/2019   Reactive airway disease without complication    Dyspnea 40/98/1191   Acute ischemic stroke ([place]) 05/05/2019   AKI (acute kidney injury) ([place]) 07/03/2018   Hypertensive emergency 07/01/2018   Tobacco abuse 07/01/2018   Hypokalemia 07/01/2018   Chronic diastolic CHF (congestive heart failure) ([place]) 07/01/2018   Positive D dimer    Hypertensive urgency 02/22/2018   Malignant hypertension 02/20/2018   Shortness of breath 02/20/2018   Stroke ([place])    Chest pain 04/14/2015   Right sided weakness 04/14/2015   Hyperlipidemia  Body mass index 40.0-44.9, adult ([place])    Essential hypertension    Blood glucose elevated    Asthma    OSA (obstructive sleep apnea) 05/06/2013    Past Surgical History:  Procedure Laterality Date   CARPAL TUNNEL RELEASE Left ~ 2014   JOINT REPLACEMENT     TOTAL HIP ARTHROPLASTY Right 11/06/2010   TRANSPHENOIDAL / TRANSNASAL HYPOPHYSECTOMY / RESECTION PITUITARY TUMOR  06/13/2012   TUBAL LIGATION  1974     OB History   No obstetric history on file.     Family History  Problem Relation Age of Onset   Heart failure Mother    Hypertension Mother    Diabetes  Father    Hypertension Sister    Heart failure Sister    Hypertension Daughter     Social History   Tobacco Use   Smoking status: Never Smoker   Smokeless tobacco: Never Used  Vaping Use   Vaping Use: Never used  Substance Use Topics   Alcohol use: Yes    Alcohol/week: 0.0 standard drinks    Comment: rarely; "I don't drink.  But I like it."   Drug use: No    Home Medications Prior to Admission medications   Medication Sig Start Date End Date Taking? Authorizing Provider  acetaminophen (TYLENOL) 500 MG tablet Take 1,000 mg by mouth every 6 (six) hours as needed for headache (pain).    [provider]  albuterol (PROVENTIL HFA;VENTOLIN HFA) 108 (90 Base) MCG/ACT inhaler Inhale 2 puffs into the lungs every 6 (six) hours as needed for wheezing. Patient taking differently: Inhale 2 puffs into the lungs every 6 (six) hours as needed for wheezing or shortness of breath.  02/24/18   Danford, Suann Larry, MD  amLODipine (NORVASC) 10 MG tablet Take 1 tablet (10 mg total) by mouth daily. Patient taking differently: Take 10 mg by mouth daily at 2 PM.  05/07/19   Shelly Coss, MD  atorvastatin (LIPITOR) 80 MG tablet Take 1 tablet (80 mg total) by mouth daily. Patient taking differently: Take 80 mg by mouth every Thursday.  02/24/18   Danford, Suann Larry, MD  budesonide-formoterol (SYMBICORT) 160-4.5 MCG/ACT inhaler Inhale 2 puffs into the lungs 2 (two) times daily. 11/24/19   Shelly Coss, MD  carvedilol (COREG) 6.25 MG tablet Take 6.25 mg by mouth 2 (two) times daily. 12/01/19   [provider]  cloNIDine (CATAPRES) 0.3 MG tablet Take 1 tablet (0.3 mg total) by mouth 2 (two) times daily. Patient taking differently: Take 0.3 mg by mouth 2 (two) times daily. at 2pm and after supper 05/07/19   Shelly Coss, MD  clopidogrel (PLAVIX) 75 MG tablet Take 1 tablet (75 mg total) by mouth daily. 02/24/18   Danford, Suann Larry, MD  colchicine 0.6 MG tablet Take 1 tablet  (0.6 mg total) by mouth daily for 7 days. 03/29/20 04/05/20  Darliss Cheney, MD  furosemide (LASIX) 40 MG tablet Take 1 tablet (40 mg total) by mouth daily at 2 PM. Patient taking differently: Take 40 mg by mouth daily.  11/24/19 03/27/29  Shelly Coss, MD  guaiFENesin (MUCINEX) 600 MG 12 hr tablet Take 2 tablets (1,200 mg total) by mouth 2 (two) times daily. Patient not taking: Reported on 03/27/2020 11/24/19   Shelly Coss, MD  hydrALAZINE (APRESOLINE) 25 MG tablet Take 1 tablet (25 mg total) by mouth every 8 (eight) hours. 03/28/20 04/27/20  Darliss Cheney, MD  ipratropium-albuterol (DUONEB) 0.5-2.5 (3) MG/3ML SOLN Take 3 mLs by nebulization every 4 (  four) hours as needed. Patient taking differently: Take 3 mLs by nebulization every 4 (four) hours as needed (for breathing).  11/24/19   Shelly Coss, MD  metFORMIN (GLUCOPHAGE) 500 MG tablet Take 1 tablet (500 mg total) by mouth 2 (two) times daily with a meal. 11/24/19 11/23/20  Shelly Coss, MD  potassium chloride (KLOR-CON) 10 MEQ tablet Take 2 tablets (20 mEq total) by mouth daily. Patient taking differently: Take 20 mEq by mouth daily at 2 PM.  07/16/19   Marianna Payment, MD  valsartan (DIOVAN) 320 MG tablet Take 320 mg by mouth daily.  08/26/19   [provider]    Allergies    Morphine and related, Penicillins, Percocet [oxycodone-acetaminophen], Ace inhibitors, Iodinated diagnostic agents, Iodine, Etodolac, Tramadol, and Vicodin [hydrocodone-acetaminophen]  Review of Systems   Review of Systems  Constitutional: Positive for diaphoresis. Negative for chills and fever.  Respiratory: Positive for shortness of breath. Negative for cough.   Cardiovascular: Positive for chest pain. Negative for leg swelling.  Gastrointestinal: Negative for abdominal pain, nausea and vomiting.  Neurological: Positive for headaches. Negative for syncope.  All other systems reviewed and are negative.   Physical Exam Updated Vital Signs BP (!) 154/89 (BP  Location: Right Arm)    Pulse 77    Temp 98.3 F (36.8 C) (Oral)    Resp 16    SpO2 97%   Physical Exam Vitals and nursing note reviewed.  Constitutional:      General: She is not in acute distress.    Appearance: She is well-developed. She is obese. She is not ill-appearing.  HENT:     Head: Normocephalic and atraumatic.  Eyes:     General: No scleral icterus.       Right eye: No discharge.        Left eye: No discharge.     Conjunctiva/sclera: Conjunctivae normal.     Pupils: Pupils are equal, round, and reactive to light.  Cardiovascular:     Rate and Rhythm: Normal rate and regular rhythm.  Pulmonary:     Effort: Pulmonary effort is normal. No respiratory distress.     Breath sounds: Normal breath sounds.  Abdominal:     General: There is no distension.     Palpations: Abdomen is soft.     Tenderness: There is no abdominal tenderness.  Musculoskeletal:     Cervical back: Normal range of motion.     Right lower leg: Edema present.     Left lower leg: Edema present.     Comments: Trace lower extremity edema  Skin:    General: Skin is warm and dry.  Neurological:     Mental Status: She is alert and oriented to person, place, and time.  Psychiatric:        Behavior: Behavior normal.     ED Results / Procedures / Treatments   Labs (all labs ordered are listed, but only abnormal results are displayed) Labs Reviewed  BASIC METABOLIC PANEL - Abnormal; Notable for the following components:      Result Value   Potassium 3.4 (*)    Glucose, Bld 173 (*)    Creatinine, Ser 1.15 (*)    Calcium 8.8 (*)    GFR calc non Af Amer 48 (*)    GFR calc Af Amer 56 (*)    All other components within normal limits  TROPONIN I (HIGH SENSITIVITY) - Abnormal; Notable for the following components:   Troponin I (High Sensitivity) 350 (*)  All other components within normal limits  SARS CORONAVIRUS 2 BY RT PCR [facility] ORDER, [place] LAB)  CBC  TROPONIN I  (HIGH SENSITIVITY)    EKG EKG Interpretation  Date/Time:  Tuesday May 24 2020 03:54:10 EDT Ventricular Rate:  76 PR Interval:  172 QRS Duration: 86 QT Interval:  388 QTC Calculation: 436 R Axis:   71 Text Interpretation: Sinus rhythm with Premature atrial complexes Possible Anterior infarct , age undetermined ST & T wave abnormality, consider inferolateral ischemia Abnormal ECG When compared with ECG of 03/27/2020, Premature atrial complexes are now present Confirmed by Delora Fuel ([id number]) on 05/24/2020 5:35:46 AM   Radiology DG Chest 2 View  Result Date: 05/24/2020 CLINICAL DATA:  Chest pain.  Hypoxia. EXAM: CHEST - 2 VIEW COMPARISON:  Two-view chest x-ray 03/27/2020 FINDINGS: Cardiomegaly is stable. Patchy interstitial and airspace opacities are present bilaterally. No significant consolidation is present. Axial skeleton is unremarkable. IMPRESSION: 1. Stable cardiomegaly. 2. Patchy interstitial and airspace opacities bilaterally. Differential diagnosis includes edema versus infection. Electronically Signed   By: San Morelle M.D.   On: 05/24/2020 04:48    Procedures Procedures (including critical care time)  CRITICAL CARE Performed by: Recardo Evangelist   Total critical care time: 35 minutes  Critical care time was exclusive of separately billable procedures and treating other patients.  Critical care was necessary to treat or prevent imminent or life-threatening deterioration.  Critical care was time spent personally by me on the following activities: development of treatment plan with patient and/or surrogate as well as nursing, discussions with consultants, evaluation of patient's response to treatment, examination of patient, obtaining history from patient or surrogate, ordering and performing treatments and interventions, ordering and review of laboratory studies, ordering and review of radiographic studies, pulse oximetry and re-evaluation of patient's  condition.   Medications Ordered in ED Medications  sodium chloride flush (NS) 0.9 % injection 3 mL (has no administration in time range)  nitroGLYCERIN 50 mg in dextrose 5 % 250 mL (0.2 mg/mL) infusion (has no administration in time range)    ED Course  I have reviewed the triage vital signs and the nursing notes.  Pertinent labs & imaging results that were available during my care of the patient were reviewed by me and considered in my medical decision making (see chart for details).  69 year old female presents with acute onset of chest pain that woke her up from sleep at 3:30 this morning. On exam she is short of breath but calm and cooperative. She is still having mild chest tightness. Her initial EKG is abnormal - she has chronic ST/T wave changes but is unchanged from prior. Her troponin however is coming back elevated at 350. Labs show mild hypokalemia (3.4), hyperglycemia (173). SCr is 1.1. CXR shows patchy interstitial opacities which may represent edema vs infection. Pt has no fever, cough, leukocytosis. Could be volume overload. Will add on BNP. COVID test ordered and she is NPO. Will discuss with Dr. Terrence Dupont  7:11AM Discussed with Dr. Terrence Dupont - he would like me to talk to STEMI doc since she is having active pain.   7:16AM Discussed with Dr. Irish Lack - he will put pt on cath schedule and Dr. Terrence Dupont will come to see pt and place cath orders.   MDM Rules/Calculators/A&P                          Final Clinical Impression(s) / ED Diagnoses  Final diagnoses:  NSTEMI (non-ST elevated myocardial infarction) [facility])    Rx / [facility] Orders ED Discharge Orders    None       Recardo Evangelist, PA-C 05/24/20 0918    Recardo Evangelist, PA-C 05/24/20 0920    Carmin Muskrat, MD 05/24/20 1309

## 2020-05-24 NOTE — ED Notes (Signed)
Dr. Terrence Dupont at bedside discussing cardiac cath procedure with patient

## 2020-05-24 NOTE — Progress Notes (Signed)
    Called by ER regarding patient of Dr. Terrence Dupont with NSTEMI.  Per ER, patient had severe pain this AM but it has improved significantly, even before heparin or NTG was started.  She will need cath, but does not meet STEMI criteria.  Discussed with ER to call Dr. Terrence Dupont regarding admission and we can cath her later this morning after appropriate medical therapy has been started.   Jettie Booze, MD

## 2020-05-25 ENCOUNTER — Inpatient Hospital Stay (HOSPITAL_COMMUNITY): Payer: Medicare HMO

## 2020-05-25 ENCOUNTER — Encounter (HOSPITAL_COMMUNITY): Payer: Self-pay | Admitting: Cardiology

## 2020-05-25 LAB — CBC
HCT: 34.6 % — ABNORMAL LOW (ref 36.0–46.0)
HCT: 28.9 % — ABNORMAL LOW (ref 36.0–46.0)
Hemoglobin: 11.2 g/dL — ABNORMAL LOW (ref 12.0–15.0)
Hemoglobin: 9.5 g/dL — ABNORMAL LOW (ref 12.0–15.0)
MCH: 27.8 pg (ref 26.0–34.0)
MCH: 28.6 pg (ref 26.0–34.0)
MCHC: 32.4 g/dL (ref 30.0–36.0)
MCHC: 32.9 g/dL (ref 30.0–36.0)
MCV: 85.9 fL (ref 80.0–100.0)
MCV: 87 fL (ref 80.0–100.0)
Platelets: 317 10*3/uL (ref 150–400)
Platelets: 283 10*3/uL (ref 150–400)
RBC: 4.03 MIL/uL (ref 3.87–5.11)
RBC: 3.32 MIL/uL — ABNORMAL LOW (ref 3.87–5.11)
RDW: 15.2 % (ref 11.5–15.5)
RDW: 15.5 % (ref 11.5–15.5)
WBC: 12.7 10*3/uL — ABNORMAL HIGH (ref 4.0–10.5)
WBC: 17.7 10*3/uL — ABNORMAL HIGH (ref 4.0–10.5)
nRBC: 0 % (ref 0.0–0.2)
nRBC: 0 % (ref 0.0–0.2)

## 2020-05-25 LAB — GLUCOSE, CAPILLARY
Glucose-Capillary: 157 mg/dL — ABNORMAL HIGH (ref 70–99)
Glucose-Capillary: 147 mg/dL — ABNORMAL HIGH (ref 70–99)
Glucose-Capillary: 129 mg/dL — ABNORMAL HIGH (ref 70–99)
Glucose-Capillary: 127 mg/dL — ABNORMAL HIGH (ref 70–99)

## 2020-05-25 LAB — BASIC METABOLIC PANEL WITH GFR
Anion gap: 12 (ref 5–15)
BUN: 20 mg/dL (ref 8–23)
CO2: 24 mmol/L (ref 22–32)
Calcium: 8.7 mg/dL — ABNORMAL LOW (ref 8.9–10.3)
Chloride: 101 mmol/L (ref 98–111)
Creatinine, Ser: 1.32 mg/dL — ABNORMAL HIGH (ref 0.44–1.00)
GFR calc Af Amer: 47 mL/min — ABNORMAL LOW (ref >60)
GFR calc non Af Amer: 41 mL/min — ABNORMAL LOW (ref >60)
Glucose, Bld: 196 mg/dL — ABNORMAL HIGH (ref 70–99)
Potassium: 4 mmol/L (ref 3.5–5.1)
Sodium: 137 mmol/L (ref 135–145)

## 2020-05-25 LAB — LIPID PANEL
Cholesterol: 198 mg/dL (ref 0–200)
HDL: 40 mg/dL — ABNORMAL LOW (ref >40)
LDL Cholesterol: 145 mg/dL — ABNORMAL HIGH (ref 0–99)
Total CHOL/HDL Ratio: 5 ratio
Triglycerides: 67 mg/dL (ref <150)
VLDL: 13 mg/dL (ref 0–40)

## 2020-05-25 LAB — POCT ACTIVATED CLOTTING TIME: Activated Clotting Time: 197 s

## 2020-05-25 MED ORDER — IRBESARTAN 150 MG PO TABS
150.0000 mg | ORAL_TABLET | Freq: Every day | ORAL | Status: DC
  Administered 2020-05-25 – 2020-05-26 (×2): 150 mg via ORAL
  Filled 2020-05-25 (×2): qty 1

## 2020-05-25 MED ORDER — TICAGRELOR 90 MG PO TABS: 90.0000 mg | ORAL_TABLET | Freq: Two times a day (BID) | ORAL | 11 refills | Status: DC

## 2020-05-25 MED ORDER — NITROGLYCERIN 0.4 MG SL SUBL: 0.4000 mg | SUBLINGUAL_TABLET | SUBLINGUAL | 12 refills | Status: DC | PRN

## 2020-05-25 MED ORDER — OXYCODONE-ACETAMINOPHEN 5-325 MG PO TABS
1.0000 | ORAL_TABLET | Freq: Four times a day (QID) | ORAL | Status: DC
  Filled 2020-05-25: qty 1

## 2020-05-25 MED ORDER — AMLODIPINE BESYLATE 10 MG PO TABS
10.0000 mg | ORAL_TABLET | Freq: Every day | ORAL | Status: DC
  Administered 2020-05-26: 10 mg via ORAL
  Filled 2020-05-25: qty 1

## 2020-05-25 MED ORDER — PANTOPRAZOLE SODIUM 40 MG PO TBEC
40.0000 mg | DELAYED_RELEASE_TABLET | Freq: Every day | ORAL | Status: DC
  Administered 2020-05-25 – 2020-05-26 (×2): 40 mg via ORAL
  Filled 2020-05-25 (×2): qty 1

## 2020-05-25 MED ORDER — ACETAMINOPHEN 500 MG PO TABS
1000.0000 mg | ORAL_TABLET | Freq: Four times a day (QID) | ORAL | Status: DC | PRN
  Administered 2020-05-25 – 2020-05-27 (×6): 1000 mg via ORAL
  Filled 2020-05-25 (×7): qty 2

## 2020-05-25 MED ORDER — OXYCODONE-ACETAMINOPHEN 5-325 MG PO TABS: 1.0000 | ORAL_TABLET | Freq: Four times a day (QID) | ORAL | Status: DC | PRN

## 2020-05-25 MED ORDER — METFORMIN HCL 500 MG PO TABS: 500.0000 mg | ORAL_TABLET | Freq: Two times a day (BID) | ORAL | 1 refills | Status: DC

## 2020-05-25 MED ORDER — AMLODIPINE BESYLATE 5 MG PO TABS
5.0000 mg | ORAL_TABLET | Freq: Every day | ORAL | Status: DC
  Administered 2020-05-25: 5 mg via ORAL
  Filled 2020-05-25 (×2): qty 1

## 2020-05-25 MED ORDER — AMLODIPINE BESYLATE 5 MG PO TABS
5.0000 mg | ORAL_TABLET | Freq: Once | ORAL | Status: AC
  Administered 2020-05-25: 5 mg via ORAL
  Filled 2020-05-25: qty 1

## 2020-05-25 MED FILL — Lidocaine HCl Local Preservative Free (PF) Inj 1%: INTRAMUSCULAR | Qty: 30 | Status: AC

## 2020-05-25 NOTE — Progress Notes (Addendum)
Patient still complaining of severe right groin pain despite getting medication. Assessed site and it is slightly harder than her left side. Confirmed with second Rn. Hgb 9.5 from 11.7. Paged Cardiology, got order for CT ABD/Pelvis wo contrast. Will continue to monitor patient.    Update 2330: spoke with Dr Terrence Dupont about CT results. Stated to continue with pain control and monitor vital signs closely. He would be in the am to check on patient. Patient alert & oriented x4,  BP 142/63, HR 83, 100% RA. Will continue to monitor pt.

## 2020-05-25 NOTE — Progress Notes (Signed)
Subjective:  Complains of right groin pain after straining during BM. Denies any chest pain or shortness of breath. Hemodynamically stable Objective:  Vital Signs in the last 24 hours: Temp:  [97.8 F (36.6 C)-99 F (37.2 C)] 98.4 F (36.9 C) (08/11 1117) Pulse Rate:  [58-82] 78 (08/11 1200) Resp:  [15-30] 17 (08/11 1200) BP: (104-197)/(60-165) 157/88 (08/11 1200) SpO2:  [95 %-100 %] 100 % (08/11 1200) Arterial Line BP: (195-198)/(82) 195/82 (08/10 1615) Weight:  [108 kg-109.2 kg] 109.2 kg (08/11 0518)  Intake/Output from previous day: 08/10 0701 - 08/11 0700 In: 1670.9 [P.O.:120; I.V.:1550.9] Out: 1500 [Urine:1500] Intake/Output from this shift: Total I/O In: 836 [P.O.:240; I.V.:596] Out: -   Physical Exam: exam unchanged.  Right groin soft, no ecchymosis, no hematoma or bruit  Lab Results: Recent Labs    05/24/20 0404 05/25/20 0107  WBC 7.2 12.7*  HGB 12.2 11.2*  PLT 320 317   Recent Labs    05/24/20 0404 05/25/20 0107  NA 139 137  K 3.4* 4.0  CL 101 101  CO2 26 24  GLUCOSE 173* 196*  BUN 16 20  CREATININE 1.15* 1.32*   No results for input(s): TROPONINI in the last 72 hours.  Invalid input(s): CK, MB Hepatic Function Panel No results for input(s): PROT, ALBUMIN, AST, ALT, ALKPHOS, BILITOT, BILIDIR, IBILI in the last 72 hours. Recent Labs    05/25/20 0107  CHOL 198   No results for input(s): PROTIME in the last 72 hours.  Imaging: Imaging results have been reviewed and DG Chest 2 View  Result Date: 05/24/2020 CLINICAL DATA:  Chest pain.  Hypoxia. EXAM: CHEST - 2 VIEW COMPARISON:  Two-view chest x-ray 03/27/2020 FINDINGS: Cardiomegaly is stable. Patchy interstitial and airspace opacities are present bilaterally. No significant consolidation is present. Axial skeleton is unremarkable. IMPRESSION: 1. Stable cardiomegaly. 2. Patchy interstitial and airspace opacities bilaterally. Differential diagnosis includes edema versus infection. Electronically  Signed   By: San Morelle M.D.   On: 05/24/2020 04:48   CARDIAC CATHETERIZATION  Result Date: 05/24/2020  Mid LAD lesion is 99% stenosed.  A drug-eluting stent was successfully placed using a STENT RESOLUTE ONYX 3.5X22, postdilated to 4.0 mm, optimized with IVUS.  Post intervention, there is a 0% residual stenosis.  Continue DAPT for 12 months along with aggressive secondary prevention.  Will give a dose of IV Lasix post procedure given her elevated LVEDP.   CARDIAC CATHETERIZATION  Result Date: 05/24/2020  Prox RCA to Mid RCA lesion is 60% stenosed.  Mid LAD lesion is 95% stenosed.  1st Diag lesion is 70% stenosed.  Ost LAD lesion is 20% stenosed.  The left ventricular systolic function is normal.  LV end diastolic pressure is moderately elevated.  The left ventricular ejection fraction is 50-55% by visual estimate.     Cardiac Studies:  Assessment/Plan:  Status post acute non-ST elevation MI, status post left cardiac catheterization/PTCA stenting to mid LAD, doing well Right groin pain Uncontrolled hypertension Type 2 diabetes mellitus Hyperlipidemia History of TIA/CVA Morbid obesity Obstructive sleep apnea History of gouty arthritis Plan Check stat CBC.  If significant drop in hemoglobin.  Will consider getting a CT of the abdomen to rule out retroperitoneal bleed. Will hold off for discharge today. Rx for pain  LOS: 1 day    Sherry Fitzpatrick 05/25/2020, 2:43 PM

## 2020-05-25 NOTE — Progress Notes (Signed)
[id number] Groin hurting too much to walk. Pt requesting to see Dr Terrence Dupont and asking to stay another day as she is hurting. Dr Terrence Dupont in to see pt and assess groin. Completed MI education with pt except for ex ed. Discussed importance of brilinta with stent, reviewed NTG use, MI restrictions, carb counting and heart healthy food choices and CRP 2 . Pt very interested in CRP 2 [place]. Referral made.  Will follow up tomorrow to walk. Pt voiced understanding of ed and Dr Terrence Dupont had already reviewed much of it with her which she could recall. Graylon Good RN BSN 05/25/2020 1:31 PM

## 2020-05-25 NOTE — TOC Benefit Eligibility Note (Signed)
Transition of Care [facility]) Benefit Eligibility Note    Patient Details  Name: Sherry Fitzpatrick MRN: [id number] Date of Birth: 07-Jun-1951   Medication/Dose: Kary Kos 90mg  bid 30 day supply  Covered?: Yes  Tier: 3 Drug  Prescription Coverage [facility]: [facility]  Spoke with Person/Company/Phone Number:: Oshane A. W[employer] Ph# [phone]  Co-Pay: $45.00  Prior Approval: No  Deductible:  (No Deductible)       [name] Phone Number: 05/25/2020, 11:49 AM

## 2020-05-25 NOTE — Progress Notes (Signed)
Subjective:  Doing well.  Denies any chest pain or shortness of breath.  Tolerated left cardiac catheterization/PCI to mid LAD yesterday.  Objective:  Vital Signs in the last 24 hours: Temp:  [97.8 F (36.6 C)-99 F (37.2 C)] 99 F (37.2 C) (08/11 0813) Pulse Rate:  [0-88] 82 (08/11 0700) Resp:  [0-34] 24 (08/11 0700) BP: (104-217)/(60-165) 178/71 (08/11 0700) SpO2:  [0 %-100 %] 99 % (08/11 0700) Arterial Line BP: (195-198)/(82) 195/82 (08/10 1615) Weight:  [108 kg-109.2 kg] 109.2 kg (08/11 0518)  Intake/Output from previous day: 08/10 0701 - 08/11 0700 In: 1670.9 [P.O.:120; I.V.:1550.9] Out: 1500 [Urine:1500] Intake/Output from this shift: No intake/output data recorded.  Physical Exam: Neck: no carotid bruit, no JVD, supple, symmetrical, trachea midline and thyroid not enlarged, symmetric, no tenderness/mass/nodules Lungs: clear to auscultation bilaterally Abdomen: soft, non-tender; bowel sounds normal; no masses,  no organomegaly Extremities: extremities normal, atraumatic, no cyanosis or edema and right groin stable.  No evidence of hematoma, or bruit.  Lab Results: Recent Labs    05/24/20 0404 05/25/20 0107  WBC 7.2 12.7*  HGB 12.2 11.2*  PLT 320 317   Recent Labs    05/24/20 0404 05/25/20 0107  NA 139 137  K 3.4* 4.0  CL 101 101  CO2 26 24  GLUCOSE 173* 196*  BUN 16 20  CREATININE 1.15* 1.32*   No results for input(s): TROPONINI in the last 72 hours.  Invalid input(s): CK, MB Hepatic Function Panel No results for input(s): PROT, ALBUMIN, AST, ALT, ALKPHOS, BILITOT, BILIDIR, IBILI in the last 72 hours. Recent Labs    05/25/20 0107  CHOL 198   No results for input(s): PROTIME in the last 72 hours.  Imaging: Imaging results have been reviewed and DG Chest 2 View  Result Date: 05/24/2020 CLINICAL DATA:  Chest pain.  Hypoxia. EXAM: CHEST - 2 VIEW COMPARISON:  Two-view chest x-ray 03/27/2020 FINDINGS: Cardiomegaly is stable. Patchy interstitial and  airspace opacities are present bilaterally. No significant consolidation is present. Axial skeleton is unremarkable. IMPRESSION: 1. Stable cardiomegaly. 2. Patchy interstitial and airspace opacities bilaterally. Differential diagnosis includes edema versus infection. Electronically Signed   By: San Morelle M.D.   On: 05/24/2020 04:48   CARDIAC CATHETERIZATION  Result Date: 05/24/2020  Mid LAD lesion is 99% stenosed.  A drug-eluting stent was successfully placed using a STENT RESOLUTE ONYX 3.5X22, postdilated to 4.0 mm, optimized with IVUS.  Post intervention, there is a 0% residual stenosis.  Continue DAPT for 12 months along with aggressive secondary prevention.  Will give a dose of IV Lasix post procedure given her elevated LVEDP.   CARDIAC CATHETERIZATION  Result Date: 05/24/2020  Prox RCA to Mid RCA lesion is 60% stenosed.  Mid LAD lesion is 95% stenosed.  1st Diag lesion is 70% stenosed.  Ost LAD lesion is 20% stenosed.  The left ventricular systolic function is normal.  LV end diastolic pressure is moderately elevated.  The left ventricular ejection fraction is 50-55% by visual estimate.     Cardiac Studies:  Assessment/Plan:  Status post acute non-ST elevation MI, status post left cardiac catheterization/PTCA stenting to mid LAD, doing well Uncontrolled hypertension Type 2 diabetes mellitus Hyperlipidemia History of TIA/CVA Morbid obesity Obstructive sleep apnea History of gouty arthritis Plan As per orders. Phase 1 cardiac rehabilitation. Discussed with patient at length regarding lifestyle changes.  Compliance with medication , diet.and follow-up. Post cardiac catheter/PCI instructions have been given.  Patient will be discharged home later this afternoon if stable.  Schedule for phase 2 cardiac rehabilitation as outpatient.  LOS: 1 day    Charolette Forward 05/25/2020, 8:55 AM

## 2020-05-25 NOTE — Progress Notes (Signed)
Dr Terrence Dupont notified of client c/o 10/10 right lower abd pain and orders noted

## 2020-05-26 LAB — CBC
HCT: 25.3 % — ABNORMAL LOW (ref 36.0–46.0)
Hemoglobin: 8.5 g/dL — ABNORMAL LOW (ref 12.0–15.0)
MCH: 29.2 pg (ref 26.0–34.0)
MCHC: 33.6 g/dL (ref 30.0–36.0)
MCV: 86.9 fL (ref 80.0–100.0)
Platelets: 253 10*3/uL (ref 150–400)
RBC: 2.91 MIL/uL — ABNORMAL LOW (ref 3.87–5.11)
RDW: 15.6 % — ABNORMAL HIGH (ref 11.5–15.5)
WBC: 15.2 10*3/uL — ABNORMAL HIGH (ref 4.0–10.5)
nRBC: 0 % (ref 0.0–0.2)

## 2020-05-26 LAB — BASIC METABOLIC PANEL
Anion gap: 10 (ref 5–15)
BUN: 24 mg/dL — ABNORMAL HIGH (ref 8–23)
CO2: 27 mmol/L (ref 22–32)
Calcium: 8.3 mg/dL — ABNORMAL LOW (ref 8.9–10.3)
Chloride: 102 mmol/L (ref 98–111)
Creatinine, Ser: 1.36 mg/dL — ABNORMAL HIGH (ref 0.44–1.00)
GFR calc Af Amer: 46 mL/min — ABNORMAL LOW (ref >60)
GFR calc non Af Amer: 39 mL/min — ABNORMAL LOW (ref >60)
Glucose, Bld: 142 mg/dL — ABNORMAL HIGH (ref 70–99)
Potassium: 3.4 mmol/L — ABNORMAL LOW (ref 3.5–5.1)
Sodium: 139 mmol/L (ref 135–145)

## 2020-05-26 LAB — GLUCOSE, CAPILLARY
Glucose-Capillary: 125 mg/dL — ABNORMAL HIGH (ref 70–99)
Glucose-Capillary: 157 mg/dL — ABNORMAL HIGH (ref 70–99)
Glucose-Capillary: 122 mg/dL — ABNORMAL HIGH (ref 70–99)

## 2020-05-26 MED ORDER — FERROUS SULFATE 325 (65 FE) MG PO TABS
325.0000 mg | ORAL_TABLET | Freq: Three times a day (TID) | ORAL | Status: DC
  Administered 2020-05-28 – 2020-06-01 (×11): 325 mg via ORAL
  Filled 2020-05-26 (×11): qty 1

## 2020-05-26 MED ORDER — CLOPIDOGREL BISULFATE 75 MG PO TABS
75.0000 mg | ORAL_TABLET | Freq: Every day | ORAL | Status: DC
  Administered 2020-05-26: 75 mg via ORAL
  Filled 2020-05-26: qty 1

## 2020-05-26 MED ORDER — POLYETHYLENE GLYCOL 3350 17 G PO PACK
17.0000 g | PACK | Freq: Every day | ORAL | Status: DC
  Administered 2020-05-26: 17 g via ORAL
  Filled 2020-05-26: qty 1

## 2020-05-26 NOTE — Progress Notes (Signed)
0930 Checked in with pt. Holding ambulation. Will follow up tomorrow. Graylon Good RN BSN 05/26/2020 9:32 AM

## 2020-05-26 NOTE — Progress Notes (Signed)
Subjective:  Patient denies any chest pain or shortness of breath. States right groin and lower abdominal pain is improved. Overall feels better. Patient had significant drop in hemoglobin had CT of the abdomen and pelvis which showed large anterior abdominal wall hematoma extending from femoral vasculature no evidence of retroperitoneal bleed.  Objective:  Vital Signs in the last 24 hours: Temp:  [97.7 F (36.5 C)-98.4 F (36.9 C)] 98.2 F (36.8 C) (08/12 0734) Pulse Rate:  [65-82] 81 (08/12 0734) Resp:  [14-23] 18 (08/12 0734) BP: (140-189)/(53-130) 166/71 (08/12 0734) SpO2:  [96 %-100 %] 100 % (08/12 0510) Weight:  [115.2 kg] 115.2 kg (08/12 0510)  Intake/Output from previous day: 08/11 0701 - 08/12 0700 In: 1317.7 [P.O.:720; I.V.:597.7] Out: 150 [Urine:150] Intake/Output from this shift: No intake/output data recorded.  Physical Exam: Neck: no adenopathy, no carotid bruit, no JVD and supple, symmetrical, trachea midline Lungs: Clear to auscultation anterolaterally Heart: regular rate and rhythm, S1, S2 normal and Soft systolic murmur noted Abdomen: Soft bowel sounds present tenderness in right lower quadrant noted in the abdominal wall. No ecchymosisRight groin no evidence of hematoma ecchymosis or bruit Extremities: extremities normal, atraumatic, no cyanosis or edema  Lab Results: Recent Labs    05/25/20 1757 05/26/20 0351  WBC 17.7* 15.2*  HGB 9.5* 8.5*  PLT 283 253   Recent Labs    05/25/20 0107 05/26/20 0351  NA 137 139  K 4.0 3.4*  CL 101 102  CO2 24 27  GLUCOSE 196* 142*  BUN 20 24*  CREATININE 1.32* 1.36*   No results for input(s): TROPONINI in the last 72 hours.  Invalid input(s): CK, MB Hepatic Function Panel No results for input(s): PROT, ALBUMIN, AST, ALT, ALKPHOS, BILITOT, BILIDIR, IBILI in the last 72 hours. Recent Labs    05/25/20 0107  CHOL 198   No results for input(s): PROTIME in the last 72 hours.  Imaging: Imaging results have  been reviewed and CT ABDOMEN PELVIS WO CONTRAST  Result Date: 05/25/2020 CLINICAL DATA:  Unspecified abdominal pain, status post catheterization, right groin pain EXAM: CT ABDOMEN AND PELVIS WITHOUT CONTRAST TECHNIQUE: Multidetector CT imaging of the abdomen and pelvis was performed following the standard protocol without IV contrast. COMPARISON:  None. FINDINGS: Lower chest: Mild ground-glass pulmonary infiltrate seen within the visualized lung bases bilaterally is nonspecific but may represent trace pulmonary edema. Cardiac size within normal limits. No pericardial effusion. Hepatobiliary: Layering hyperdensity within the gallbladder lumen may represent vicarious excretion of contrast or layering hyperdense sludge or stones. No superimposed pericholecystic inflammatory change. The liver is unremarkable. No intra or extrahepatic biliary ductal dilation. Pancreas: Unremarkable Spleen: Unremarkable Adrenals/Urinary Tract: Adrenal glands are unremarkable. Kidneys are normal in size and position. Simple cortical cyst noted within the a interpolar region of the left kidney. No hydronephrosis. No intrarenal or ureteral calculi. The bladder is largely obscured by streak artifact from right total hip arthroplasty, however, hyperdense material within the bladder lumen likely represents excreted contrast given the patient's history of recent catheterization. Stomach/Bowel: Stomach is within normal limits. Appendix appears normal. No evidence of bowel wall thickening, distention, or inflammatory changes. Vascular/Lymphatic: A hematoma extends from the right common femoral vascular bundle coursing within the anterior abdominal wall superolaterally between the external and internal oblique muscles. This hematoma measures roughly 18.9 x 7.0 x 5.6 cm in greatest dimension.s there is thickening of the external oblique and internal oblique muscles likely real obtained to intramuscular hemorrhage or edema. There is no extension of  the  hematoma into the retroperitoneum or below the transversus abdominus musculature into the peritoneal cavity itself. There is infiltration involving the right lower quadrant abdominal wall and extending into the right inguinal crease in keeping with subcutaneous hemorrhage or edema. Mild scattered aortoiliac atherosclerotic plaque. No aneurysm. No pathologic abdominal or pelvic adenopathy. Reproductive: The pelvic organs are unremarkable. No adnexal masses. Other: Rectum unremarkable. Musculoskeletal: Right total hip arthroplasty has been performed. Degenerative changes are seen within the lumbar spine. No acute bone abnormality. IMPRESSION: 1. Large hematoma extending from the right common femoral vascular bundle coursing within the anterior abdominal wall superolaterally between the external and internal oblique muscles. This hematoma measures roughly 18.9 x 7.0 x 5.6 cm. There is no extension of the hematoma into the retroperitoneum or below the transversus abdominus musculature into the peritoneal cavity itself. 2. Mild ground-glass pulmonary infiltrate within the visualized lung bases bilaterally is nonspecific but may represent trace pulmonary edema. 3. Layering hyperdensity within the gallbladder lumen may represent vicarious excretion of contrast or layering hyperdense sludge or stones. No superimposed pericholecystic inflammatory change. Aortic Atherosclerosis (ICD10-I70.0). Electronically Signed   By: Fidela Salisbury MD   On: 05/25/2020 23:16   CARDIAC CATHETERIZATION  Result Date: 05/24/2020  Mid LAD lesion is 99% stenosed.  A drug-eluting stent was successfully placed using a STENT RESOLUTE ONYX 3.5X22, postdilated to 4.0 mm, optimized with IVUS.  Post intervention, there is a 0% residual stenosis.  Continue DAPT for 12 months along with aggressive secondary prevention.  Will give a dose of IV Lasix post procedure given her elevated LVEDP.   CARDIAC CATHETERIZATION  Result Date: 05/24/2020   Prox RCA to Mid RCA lesion is 60% stenosed.  Mid LAD lesion is 95% stenosed.  1st Diag lesion is 70% stenosed.  Ost LAD lesion is 20% stenosed.  The left ventricular systolic function is normal.  LV end diastolic pressure is moderately elevated.  The left ventricular ejection fraction is 50-55% by visual estimate.     Cardiac Studies:  Assessment/Plan:  Status post acute non-ST elevation MI, status post left cardiac catheterization/PTCA stenting to mid LAD, doing well Right large anterior abdominal wall hematoma Acute blood loss anemia secondary to above Uncontrolled hypertension Type 2 diabetes mellitus Hyperlipidemia History of TIA/CVA Morbid obesity Obstructive sleep apnea History of gouty arthritis Plan We will switch him back Brilinta to Plavix as per orders Check CBC in a.m.   LOS: 2 days    Charolette Forward 05/26/2020, 8:48 AM

## 2020-05-27 ENCOUNTER — Inpatient Hospital Stay (HOSPITAL_COMMUNITY): Payer: Medicare HMO

## 2020-05-27 DIAGNOSIS — I724 Aneurysm of artery of lower extremity: Secondary | ICD-10-CM | POA: Diagnosis not present

## 2020-05-27 DIAGNOSIS — R579 Shock, unspecified: Secondary | ICD-10-CM | POA: Diagnosis not present

## 2020-05-27 DIAGNOSIS — J9601 Acute respiratory failure with hypoxia: Secondary | ICD-10-CM

## 2020-05-27 DIAGNOSIS — I214 Non-ST elevation (NSTEMI) myocardial infarction: Principal | ICD-10-CM

## 2020-05-27 LAB — CBC
HCT: 24.4 % — ABNORMAL LOW (ref 36.0–46.0)
HCT: 23.4 % — ABNORMAL LOW (ref 36.0–46.0)
HCT: 29.1 % — ABNORMAL LOW (ref 36.0–46.0)
HCT: 26.9 % — ABNORMAL LOW (ref 36.0–46.0)
Hemoglobin: 7.9 g/dL — ABNORMAL LOW (ref 12.0–15.0)
Hemoglobin: 7.3 g/dL — ABNORMAL LOW (ref 12.0–15.0)
Hemoglobin: 9.3 g/dL — ABNORMAL LOW (ref 12.0–15.0)
Hemoglobin: 8.8 g/dL — ABNORMAL LOW (ref 12.0–15.0)
MCH: 28.4 pg (ref 26.0–34.0)
MCH: 28.2 pg (ref 26.0–34.0)
MCH: 28.6 pg (ref 26.0–34.0)
MCH: 28.5 pg (ref 26.0–34.0)
MCHC: 32.4 g/dL (ref 30.0–36.0)
MCHC: 31.2 g/dL (ref 30.0–36.0)
MCHC: 32 g/dL (ref 30.0–36.0)
MCHC: 32.7 g/dL (ref 30.0–36.0)
MCV: 87.8 fL (ref 80.0–100.0)
MCV: 90.3 fL (ref 80.0–100.0)
MCV: 89.5 fL (ref 80.0–100.0)
MCV: 87.1 fL (ref 80.0–100.0)
Platelets: 230 10*3/uL (ref 150–400)
Platelets: 225 10*3/uL (ref 150–400)
Platelets: 193 10*3/uL (ref 150–400)
Platelets: 182 10*3/uL (ref 150–400)
RBC: 2.78 MIL/uL — ABNORMAL LOW (ref 3.87–5.11)
RBC: 2.59 MIL/uL — ABNORMAL LOW (ref 3.87–5.11)
RBC: 3.25 MIL/uL — ABNORMAL LOW (ref 3.87–5.11)
RBC: 3.09 MIL/uL — ABNORMAL LOW (ref 3.87–5.11)
RDW: 15.9 % — ABNORMAL HIGH (ref 11.5–15.5)
RDW: 15.9 % — ABNORMAL HIGH (ref 11.5–15.5)
RDW: 15.1 % (ref 11.5–15.5)
RDW: 15.5 % (ref 11.5–15.5)
WBC: 15.7 10*3/uL — ABNORMAL HIGH (ref 4.0–10.5)
WBC: 14.8 10*3/uL — ABNORMAL HIGH (ref 4.0–10.5)
WBC: 23.7 10*3/uL — ABNORMAL HIGH (ref 4.0–10.5)
WBC: 28.3 10*3/uL — ABNORMAL HIGH (ref 4.0–10.5)
nRBC: 0.3 % — ABNORMAL HIGH (ref 0.0–0.2)
nRBC: 0.8 % — ABNORMAL HIGH (ref 0.0–0.2)
nRBC: 0.9 % — ABNORMAL HIGH (ref 0.0–0.2)
nRBC: 1.5 % — ABNORMAL HIGH (ref 0.0–0.2)

## 2020-05-27 LAB — POCT I-STAT 7, (LYTES, BLD GAS, ICA,H+H)
Acid-base deficit: 4 mmol/L — ABNORMAL HIGH (ref 0.0–2.0)
Bicarbonate: 23.1 mmol/L (ref 20.0–28.0)
Calcium, Ion: 1.15 mmol/L (ref 1.15–1.40)
HCT: 27 % — ABNORMAL LOW (ref 36.0–46.0)
Hemoglobin: 9.2 g/dL — ABNORMAL LOW (ref 12.0–15.0)
O2 Saturation: 94 %
Patient temperature: 97.5
Potassium: 4.4 mmol/L (ref 3.5–5.1)
Sodium: 141 mmol/L (ref 135–145)
TCO2: 25 mmol/L (ref 22–32)
pCO2 arterial: 47.8 mmHg (ref 32.0–48.0)
pH, Arterial: 7.289 — ABNORMAL LOW (ref 7.350–7.450)
pO2, Arterial: 78 mmHg — ABNORMAL LOW (ref 83.0–108.0)

## 2020-05-27 LAB — COMPREHENSIVE METABOLIC PANEL
ALT: 23 U/L (ref 0–44)
AST: 40 U/L (ref 15–41)
Albumin: 2.6 g/dL — ABNORMAL LOW (ref 3.5–5.0)
Alkaline Phosphatase: 51 U/L (ref 38–126)
Anion gap: 11 (ref 5–15)
BUN: 29 mg/dL — ABNORMAL HIGH (ref 8–23)
CO2: 22 mmol/L (ref 22–32)
Calcium: 8 mg/dL — ABNORMAL LOW (ref 8.9–10.3)
Chloride: 107 mmol/L (ref 98–111)
Creatinine, Ser: 2.37 mg/dL — ABNORMAL HIGH (ref 0.44–1.00)
GFR calc Af Amer: 23 mL/min — ABNORMAL LOW (ref >60)
GFR calc non Af Amer: 20 mL/min — ABNORMAL LOW (ref >60)
Glucose, Bld: 176 mg/dL — ABNORMAL HIGH (ref 70–99)
Potassium: 4.1 mmol/L (ref 3.5–5.1)
Sodium: 140 mmol/L (ref 135–145)
Total Bilirubin: 0.4 mg/dL (ref 0.3–1.2)
Total Protein: 5.7 g/dL — ABNORMAL LOW (ref 6.5–8.1)

## 2020-05-27 LAB — GLUCOSE, CAPILLARY
Glucose-Capillary: 120 mg/dL — ABNORMAL HIGH (ref 70–99)
Glucose-Capillary: 128 mg/dL — ABNORMAL HIGH (ref 70–99)
Glucose-Capillary: 161 mg/dL — ABNORMAL HIGH (ref 70–99)
Glucose-Capillary: 168 mg/dL — ABNORMAL HIGH (ref 70–99)
Glucose-Capillary: 185 mg/dL — ABNORMAL HIGH (ref 70–99)
Glucose-Capillary: 233 mg/dL — ABNORMAL HIGH (ref 70–99)
Glucose-Capillary: 83 mg/dL (ref 70–99)

## 2020-05-27 LAB — TROPONIN I (HIGH SENSITIVITY): Troponin I (High Sensitivity): 425 ng/L (ref <18)

## 2020-05-27 LAB — BASIC METABOLIC PANEL
Anion gap: 10 (ref 5–15)
BUN: 24 mg/dL — ABNORMAL HIGH (ref 8–23)
CO2: 26 mmol/L (ref 22–32)
Calcium: 8.4 mg/dL — ABNORMAL LOW (ref 8.9–10.3)
Chloride: 104 mmol/L (ref 98–111)
Creatinine, Ser: 1.37 mg/dL — ABNORMAL HIGH (ref 0.44–1.00)
GFR calc Af Amer: 45 mL/min — ABNORMAL LOW (ref >60)
GFR calc non Af Amer: 39 mL/min — ABNORMAL LOW (ref >60)
Glucose, Bld: 145 mg/dL — ABNORMAL HIGH (ref 70–99)
Potassium: 3.9 mmol/L (ref 3.5–5.1)
Sodium: 140 mmol/L (ref 135–145)

## 2020-05-27 LAB — APTT: aPTT: 28 seconds (ref 24–36)

## 2020-05-27 LAB — MAGNESIUM: Magnesium: 2.2 mg/dL (ref 1.7–2.4)

## 2020-05-27 LAB — PROTIME-INR
INR: 1.3 — ABNORMAL HIGH (ref 0.8–1.2)
Prothrombin Time: 15.8 seconds — ABNORMAL HIGH (ref 11.4–15.2)

## 2020-05-27 LAB — PREPARE RBC (CROSSMATCH)

## 2020-05-27 MED ORDER — ASPIRIN 81 MG PO CHEW
81.0000 mg | CHEWABLE_TABLET | Freq: Every day | ORAL | Status: DC
  Administered 2020-05-28: 81 mg
  Filled 2020-05-27: qty 1

## 2020-05-27 MED ORDER — ATORVASTATIN CALCIUM 80 MG PO TABS
80.0000 mg | ORAL_TABLET | Freq: Every day | ORAL | Status: DC
  Administered 2020-05-28 – 2020-06-01 (×5): 80 mg
  Filled 2020-05-27 (×5): qty 1

## 2020-05-27 MED ORDER — PIPERACILLIN-TAZOBACTAM 3.375 G IVPB: 3.3750 g | Freq: Three times a day (TID) | INTRAVENOUS | Status: DC

## 2020-05-27 MED ORDER — ORAL CARE MOUTH RINSE
15.0000 mL | OROMUCOSAL | Status: DC
  Administered 2020-05-27 – 2020-05-29 (×16): 15 mL via OROMUCOSAL

## 2020-05-27 MED ORDER — CLOPIDOGREL BISULFATE 75 MG PO TABS
75.0000 mg | ORAL_TABLET | Freq: Every day | ORAL | Status: DC
  Administered 2020-05-28 – 2020-06-01 (×5): 75 mg
  Filled 2020-05-27 (×5): qty 1

## 2020-05-27 MED ORDER — SODIUM CHLORIDE 0.9 % IV SOLN: 250.0000 mL | INTRAVENOUS | Status: DC

## 2020-05-27 MED ORDER — ROCURONIUM BROMIDE 50 MG/5ML IV SOLN: 30.0000 mg | Freq: Once | INTRAVENOUS | Status: AC

## 2020-05-27 MED ORDER — PROPOFOL 1000 MG/100ML IV EMUL
0.0000 ug/kg/min | INTRAVENOUS | Status: DC
  Administered 2020-05-27: 5 ug/kg/min via INTRAVENOUS
  Administered 2020-05-27: 40 ug/kg/min via INTRAVENOUS
  Administered 2020-05-28: 35 ug/kg/min via INTRAVENOUS
  Administered 2020-05-28: 34.967 ug/kg/min via INTRAVENOUS
  Administered 2020-05-28: 39.983 ug/kg/min via INTRAVENOUS
  Filled 2020-05-27 (×4): qty 100

## 2020-05-27 MED ORDER — CHLORHEXIDINE GLUCONATE 0.12% ORAL RINSE (MEDLINE KIT)
15.0000 mL | Freq: Two times a day (BID) | OROMUCOSAL | Status: DC
  Administered 2020-05-27 – 2020-05-29 (×4): 15 mL via OROMUCOSAL

## 2020-05-27 MED ORDER — DOPAMINE-DEXTROSE 3.2-5 MG/ML-% IV SOLN
INTRAVENOUS | Status: AC
  Filled 2020-05-27: qty 250

## 2020-05-27 MED ORDER — DIPHENHYDRAMINE HCL 25 MG PO CAPS: 25.0000 mg | ORAL_CAPSULE | Freq: Three times a day (TID) | ORAL | Status: DC | PRN

## 2020-05-27 MED ORDER — DOCUSATE SODIUM 50 MG/5ML PO LIQD
100.0000 mg | Freq: Two times a day (BID) | ORAL | Status: DC
  Administered 2020-05-27 – 2020-06-01 (×5): 100 mg
  Filled 2020-05-27 (×9): qty 10

## 2020-05-27 MED ORDER — INSULIN ASPART 100 UNIT/ML ~~LOC~~ SOLN
4.0000 [IU] | SUBCUTANEOUS | Status: DC
  Administered 2020-05-27 – 2020-05-30 (×2): 4 [IU] via SUBCUTANEOUS

## 2020-05-27 MED ORDER — ETOMIDATE 2 MG/ML IV SOLN: 10.0000 mg | Freq: Once | INTRAVENOUS | Status: AC

## 2020-05-27 MED ORDER — VANCOMYCIN HCL 1500 MG/300ML IV SOLN: 1500.0000 mg | INTRAVENOUS | Status: DC

## 2020-05-27 MED ORDER — AMIODARONE HCL 200 MG PO TABS
200.0000 mg | ORAL_TABLET | Freq: Every day | ORAL | Status: DC
  Administered 2020-05-28 – 2020-05-29 (×2): 200 mg
  Filled 2020-05-27 (×2): qty 1

## 2020-05-27 MED ORDER — FENTANYL CITRATE (PF) 100 MCG/2ML IJ SOLN: 100.0000 ug | Freq: Once | INTRAMUSCULAR | Status: AC

## 2020-05-27 MED ORDER — TRAMADOL HCL 50 MG PO TABS
50.0000 mg | ORAL_TABLET | Freq: Four times a day (QID) | ORAL | Status: DC | PRN
  Administered 2020-05-27: 50 mg via ORAL
  Filled 2020-05-27: qty 1

## 2020-05-27 MED ORDER — ETOMIDATE 2 MG/ML IV SOLN
INTRAVENOUS | Status: AC
  Administered 2020-05-27: 10 mg via INTRAVENOUS
  Filled 2020-05-27: qty 20

## 2020-05-27 MED ORDER — FENTANYL CITRATE (PF) 100 MCG/2ML IJ SOLN
50.0000 ug | INTRAMUSCULAR | Status: DC | PRN
  Administered 2020-05-27: 25 ug via INTRAVENOUS
  Administered 2020-05-27: 75 ug via INTRAVENOUS
  Filled 2020-05-27 (×2): qty 2

## 2020-05-27 MED ORDER — INSULIN ASPART 100 UNIT/ML ~~LOC~~ SOLN
0.0000 [IU] | Freq: Three times a day (TID) | SUBCUTANEOUS | Status: DC
  Administered 2020-05-27 – 2020-06-01 (×10): 2 [IU] via SUBCUTANEOUS

## 2020-05-27 MED ORDER — AMIODARONE HCL 200 MG PO TABS: 200.0000 mg | ORAL_TABLET | Freq: Every day | ORAL | Status: DC

## 2020-05-27 MED ORDER — POLYETHYLENE GLYCOL 3350 17 G PO PACK
17.0000 g | PACK | Freq: Every day | ORAL | Status: DC
  Administered 2020-05-28 – 2020-06-01 (×3): 17 g
  Filled 2020-05-27 (×3): qty 1

## 2020-05-27 MED ORDER — ALUM & MAG HYDROXIDE-SIMETH 200-200-20 MG/5ML PO SUSP
30.0000 mL | ORAL | Status: DC | PRN
  Filled 2020-05-27: qty 30

## 2020-05-27 MED ORDER — MIDAZOLAM HCL 2 MG/2ML IJ SOLN
INTRAMUSCULAR | Status: AC
  Filled 2020-05-27: qty 4

## 2020-05-27 MED ORDER — MIDAZOLAM HCL 2 MG/2ML IJ SOLN: 2.0000 mg | Freq: Once | INTRAMUSCULAR | Status: AC

## 2020-05-27 MED ORDER — LACTATED RINGERS IV BOLUS
1000.0000 mL | Freq: Once | INTRAVENOUS | Status: AC
  Administered 2020-05-27: 1000 mL via INTRAVENOUS

## 2020-05-27 MED ORDER — ROCURONIUM BROMIDE 10 MG/ML (PF) SYRINGE
PREFILLED_SYRINGE | INTRAVENOUS | Status: AC
  Administered 2020-05-27: 30 mg
  Filled 2020-05-27: qty 10

## 2020-05-27 MED ORDER — SUCCINYLCHOLINE CHLORIDE 200 MG/10ML IV SOSY
PREFILLED_SYRINGE | INTRAVENOUS | Status: AC
  Filled 2020-05-27: qty 10

## 2020-05-27 MED ORDER — VANCOMYCIN HCL 2000 MG/400ML IV SOLN
2000.0000 mg | Freq: Once | INTRAVENOUS | Status: AC
  Administered 2020-05-28: 2000 mg via INTRAVENOUS
  Filled 2020-05-27: qty 400

## 2020-05-27 MED ORDER — NOREPINEPHRINE 4 MG/250ML-% IV SOLN
2.0000 ug/min | INTRAVENOUS | Status: DC
  Administered 2020-05-27: 10 ug/min via INTRAVENOUS
  Administered 2020-05-27: 32 ug/min via INTRAVENOUS
  Administered 2020-05-27: 18 ug/min via INTRAVENOUS
  Administered 2020-05-28: 17.013 ug/min via INTRAVENOUS
  Administered 2020-05-28: 8 ug/min via INTRAVENOUS
  Filled 2020-05-27: qty 250
  Filled 2020-05-27: qty 500
  Filled 2020-05-27: qty 250

## 2020-05-27 MED ORDER — SODIUM CHLORIDE 0.9% IV SOLUTION: Freq: Once | INTRAVENOUS | Status: DC

## 2020-05-27 MED ORDER — FENTANYL CITRATE (PF) 100 MCG/2ML IJ SOLN
50.0000 ug | INTRAMUSCULAR | Status: AC | PRN
  Administered 2020-05-27 – 2020-05-29 (×3): 50 ug via INTRAVENOUS
  Filled 2020-05-27 (×2): qty 2

## 2020-05-27 MED ORDER — PANTOPRAZOLE SODIUM 40 MG IV SOLR
40.0000 mg | INTRAVENOUS | Status: DC
  Administered 2020-05-27 – 2020-05-30 (×4): 40 mg via INTRAVENOUS
  Filled 2020-05-27 (×4): qty 40

## 2020-05-27 MED ORDER — VECURONIUM BROMIDE 10 MG IV SOLR
INTRAVENOUS | Status: AC
  Filled 2020-05-27: qty 10

## 2020-05-27 MED ORDER — ACETAMINOPHEN 160 MG/5ML PO SOLN: 1000.0000 mg | Freq: Four times a day (QID) | ORAL | Status: DC | PRN

## 2020-05-27 MED ORDER — SODIUM CHLORIDE 0.9 % IV SOLN
2.0000 g | INTRAVENOUS | Status: DC
  Administered 2020-05-27 – 2020-05-29 (×2): 2 g via INTRAVENOUS
  Filled 2020-05-27 (×2): qty 2

## 2020-05-27 MED ORDER — TRAMADOL HCL 50 MG PO TABS
50.0000 mg | ORAL_TABLET | Freq: Four times a day (QID) | ORAL | Status: DC | PRN
  Administered 2020-05-30: 50 mg
  Filled 2020-05-27: qty 1

## 2020-05-27 MED ORDER — SODIUM CHLORIDE 0.9 % IV SOLN: INTRAVENOUS | Status: DC | PRN

## 2020-05-27 MED ORDER — PROPOFOL 10 MG/ML IV BOLUS
INTRAVENOUS | Status: AC
  Filled 2020-05-27: qty 20

## 2020-05-27 MED ORDER — FENTANYL CITRATE (PF) 100 MCG/2ML IJ SOLN
INTRAMUSCULAR | Status: AC
  Administered 2020-05-27: 100 ug via INTRAVENOUS
  Filled 2020-05-27: qty 2

## 2020-05-27 MED ORDER — STERILE WATER FOR INJECTION IJ SOLN
INTRAMUSCULAR | Status: AC
  Filled 2020-05-27: qty 10

## 2020-05-27 MED FILL — Medication: Qty: 1 | Status: AC

## 2020-05-27 NOTE — Progress Notes (Addendum)
S: Called to re-evaluate patient.   O: BP (!) 129/59   Pulse 66   Temp 97.6 F (36.4 C) (Axillary)   Resp 20   Ht _0  (1.626 m)   Wt 116.3 kg   SpO2 100%   BMI 44.01 kg/m   General: Morbidly obese HEENT: [place]/AT. Intubated. Ext: Warm and well perfused  Neurological Exam: Ment: Sedated on propofol gtt. Does not open eyes to any stimulus. Not following commands. No attempts to communicate. Will move RUE semipurposefully to sternal rub and pinch of RUE. Other extremities with posturing to stimulation (see below).  CN: Pupils 2 mm and sluggishly reactive. No blink to threat. No doll's eye reflex. Trace corneals. Face flaccidly symmetric. Cough reflex intact.  Motor/Sensory: Extensor posturing with LUE. Reflexively adducts BLE at hips to noxious stimuli. Triple flexion reflex of BLE to plantar stimulation.  Reflexes: 1+ BUE and BLE.  Cerebellar/Gait: Unable to assess  A/R: 69 year old female with a history of stroke with no residual deficits who presented on 8/10 with NSTEMI and underwent PTCA with stenting to mid-LAD. While in the hospital today neurology was consulted for altered mental status; assessment by Neurology this afternoon revealed patient to have intermittent bilateral extensor posturing, right gaze deviation, weak gag and pupils mid and fixed. At that point patient's blood pressure dropped to systolic of 64.  At that point in time rapid response immediately intervened with pressors. CT head was negative for acute abnormality. Neurology called back to bedside to re-evaluate this evening at 10:10 PM 1. Exam reveals resolution of the right sided gaze deviation, but now with findings suggestive of a right sided lesion, either cerebral hemispheric, brainstem or both. Seizure with postictal Todd's paralysis is also possible but felt to be less likely.  2. Unable to tolerate CT contrast administration due to allergy. Will need vascular imaging with MRA head and neck, without contrast  (cannot use MRI contrast due to low eGFR).  3. Obtaining STAT EEG to rule out nonconvulsive status epilepticus.  4. Continue propofol.  5. Discussed with CCM team.   40 minutes spent in the emergent neurological evaluation and management of this critically ill patient. Time spent included coordination of care.   Addendum: -- Became profoundly hypotensive during MRI. Unable to complete. Will need volume repletion prior to second attempt.  -- Unable to perform CTA due to contrast allergy. At significant risk for an anaphylactic reaction with further hypotension, given her current hemodynamic instability. Risks significantly outweigh benefits.   Addendum: MRI with MRA of head and neck have been completed, revealing strokes without LVO.   MRI HEAD IMPRESSION: 1. Patchy small volume acute ischemic infarcts involving the right splenium and right cerebral white matter as above, with additional punctate acute ischemic left cerebellar infarct. No associated hemorrhage or mass effect. 2. Underlying age-related cerebral atrophy with moderate chronic microvascular ischemic disease.  MRA HEAD IMPRESSION: 1. Motion degraded exam. 2. Negative MRA for large vessel occlusion. 3. Extensive intracranial atherosclerotic disease with multifocal moderate to severe stenoses involving both the anterior and posterior circulation as detailed above.  MRA NECK IMPRESSION: 1. Approximate 30-40% stenoses about the origins of both internal carotid arteries, left slightly worse than right. No other hemodynamically significant stenosis or other abnormality about either carotid artery system within the neck. 2. Wide patency of both vertebral arteries within the neck.  Electronically signed: Dr. Kerney Elbe

## 2020-05-27 NOTE — Progress Notes (Signed)
1934 Dr Terrence Dupont paged with results of Ct exams, Per MD order will begin to deflate femstop from 65mmHg and place purwick  1959 Dr Terrence Dupont paged at the request of the daughter Hal Hope for an update.   2020 Dr Apolinar Junes at bedside. Bedside ultrasound completed of the heart. Dopamine order noted to be previously stopped. Will titrate to off per Md order. Blood cultures to be collect.  purwick placed.  2125 Blood cultures collected.   2200 EKG completed (see results)  2210 daughter updated via phone  2231 stat EEG completed to r/o sizures. Dr Cheral Marker at the bedside. Stat MRA/MRI head and neck ordered to r/o stroke.  2349 attempt to obtain MRI/MRA aborted. Upon arrival to MRI patients BP was noted to be 55/30 via aline which was confirmed with cuff pressure and HR noted to be ~59. Levophed dose increased and 242ml bolus normal saline given Patient reconnected to monitor and brought back to [place]. Upon arrival to [facility] patients blood pressure stable. eLink MD called, message left.   0129 daughter Hal Hope updated via phone of events.   0202 vasopressin started  0310 to MRA head/neck  0450 patient back to unit  0617 Dr Cheral Marker updated re: MRA    0645 1 u PRBC started

## 2020-05-27 NOTE — Progress Notes (Signed)
NAME:  Sherry Fitzpatrick, MRN:  [id number], DOB:  1951-08-14, LOS: 3 ADMISSION DATE:  05/24/2020, CONSULTATION DATE:  05/27/2020 REFERRING MD:  Terrence Dupont, MD CHIEF COMPLAINT:  Altered mental status   Brief History   69 year old female with PMHx of COPD, HTN, DMII, OSA, TIA/CVA, HLD, HFpEF who initially presented on 8/10 with chest pain radiating to arm with associated dyspnea and headache. She was admitted for NSTEMI and underwent LHC and coronary angiography with stenting to mid LAD on 8/10.  Patient noted to have altered mental status on 8/13. Acutely, had hypotension for which started on pressors. She was intubated for airway protection in setting of presumed stroke. Transferred to [place].  History of present illness   Sherry Fitzpatrick is a 69 year old female with past medical history of hypertension, diabetes mellitus, obstructive sleep apnea, TIA/CVA, diastolic congestive heart failure, morbid obesity, and COPD who initially presented on 8/10 with retrosternal chest pain radiating to the arm that woke her from sleep associated with dyspnea and headache. She was given aspirin and sublingual nitro by EMS and brought to the ED where EKG showed non NSR with ST-T wave changes in inferolateral leads with elevated hsTroponin 682>780. Patient was started on IV heparin and nitro with improvement in symptoms and underwent left heart catherization with PCI stenting to mid LAD with Dr. Terrence Dupont.   On morning of 8/13, patient with episode of emesis and severe lower abdominal pain and right groin pain relieved with tramadol. Later in the day, had episode of acute altered mental status with seizure activity and hypotension. She was noted to have right lateral gaze deviation for which code stroke activated. She was started on dopamine for hypotension. Also due to concerns of inability to protect her airways, patient intubated. Patient transferred to CCU and care assumed by [facility].  Past Medical History   Past Medical History:    Diagnosis Date  . Alterations of sensations, late effect of cerebrovascular disease(438.6)   . Arthritis    "all over"  . Backache, unspecified   . Body mass index 40.0-44.9, adult ([place])   . Carpal tunnel syndrome   . Dizziness and giddiness   . Esophageal reflux   . Generalized pain   . Headache    "had alot til I had pituitary tumor removed"  . Heart murmur   . Other abnormal glucose   . Other and unspecified hyperlipidemia   . Other malaise and fatigue   . Other postprocedural status(V45.89)   . Other symptoms involving cardiovascular system   . Sleep apnea    "did test; they wanted me to get a CPAP but I never did get it" (04/14/2015) Does use inhaler at night (01 2021)  . Stroke ([place]) 06/2003; 02/2010   "minor; minor" ; denies residual on 04/14/2015  . Unspecified disorder of the pituitary gland and its hypothalamic control   . Unspecified essential hypertension   . Unspecified visual disturbance    Significant Hospital Events   8/10 > admission 8/10 > LHC with PCI stent to mid-LAD  8/13 > acute AMS with seizure activity; intubated for airway protection and transferred to 2H  Consults:  Cardiology Neurology  Procedures:  8/10 > LHC and PCI stenting to mid LAD 8/13 > ETT  Significant Diagnostic Tests:  CXR 8/10 > stable cardiomegaly; patchy interstitial and airspace opacities bilaterally - edema vs infection CT Abd/Pelvis wo Contrast 8/11 > large hematoma (18.9x7x5.6cm) from R common femoral vascular bundle coursing within the anterior abdominal  wall superolaterally between external and internal oblique muscles; no extension into peritoneum or below transversus abdominus musculature; mild ground glass pulmonary infiltrate within visualized lung bases bilaterally MR Angio Head wo contrast 8/13 > pending   Micro Data:  SARS CoV2 8/10 > Negative MRSA PCR 8/10 > Negative   Antimicrobials:  None    Interim history/subjective:  Acute decompensation of mental status in  suspected seizure activity with hypotension. Required intubation for airway protection.   Objective   Blood pressure 119/64, pulse 73, temperature 97.7 F (36.5 C), temperature source Oral, resp. rate (!) 22, height 5\' 4"  (1.626 m), weight 116.3 kg, SpO2 93 %.        Intake/Output Summary (Last 24 hours) at 05/27/2020 1457 Last data filed at 05/26/2020 2223 Gross per 24 hour  Intake 123 ml  Output --  Net 123 ml   Filed Weights   05/25/20 0518 05/26/20 0510 05/27/20 0320  Weight: 109.2 kg 115.2 kg 116.3 kg    Examination: General: obese female; unresponsive, gurgling and unable to protect airway  HENT: [place]/AT, anicteric sclerae; trachea midline  Lungs: clear to auscultation on anterior auscultation Cardiovascular: irregularly irregular rhythm, S1 and S2 present Abdomen: obese abdomen with lower abdominal wall hematoma; + bowel sounds  Extremities: warm and dry; no edema noted Neuro: unresponsive to painful stimuli; right lateral gaze, bilateral extensor posturing GU: [place] in place  Resolved Hospital Problem list    Assessment & Plan:  Acute hypoxic respiratory failure secondary compromised airway in setting of acute encephalopathy  Patient requiring intubation and mechanical ventilation for airway protection in setting of acute change in mental status.  Hx of COPD for which on symbicort, albuterol and duonebs at home. May have chronic CO2 retention at baseline.  - Full ventilator support with TV 4-8 cc/kg, goal SpO2 >88% - F/u ABG for adjustments to vent settings - VAP per protocol  Acute metabolic vs structural encephalopathy  Episode of unresponsive to painful stimuli with seizure activity (intermittent bilateral extensor posturing, right gaze deviation, weak gag and pupils mid and fixed). On DAPT s/p recent stent placement.  - Neurology consulted, appreciate recommendations  - CT Head wo Contrast - MRA Head wo Contrast  - F/u electrolytes and CBC for any abnormalities   - Wean sedation as tolerated for RASS goal 0 to -1  Hypotension: Episode of hypotension with SBP in 60s during seizure activity for which started on dopamine gtt by primary - Pressor support as needed for goal MAP >65  Recent NSTEMI s/p PTCA stenting to mid LAD New onset Atrial fibrillation Admitted with NSTEMI requiring stenting to mid LAD. On DAPT following this with plavix and ASA Rate controlled with amiodarone PO  - Continue to monitor  - K >4, Mag >2 - Cardiology following, appreciate recommendations   Large anterior abdominal wall hematoma  Acute blood loss anemia  Noted to have large anterior abdominal wall hematoma on CT Abdomen on 8/11 following LHC. Per RN, this appears to be expanding today.  Given her acute change in mental status, concern for worsening blood loss anemia  - CBC and CT Abdomen/Pelvis  - Transfuse as needed for Hb>7  Acute on chronic renal failure:  Appears to have baseline sCr of 1.15 on admission. Up to 1.37 today. Noted to have good UOP No electrolyte abnormalities noted. - F/u BMP - Avoid nephrotoxic agents as able   Diabetes mellitus  HbA1c 7.3 on admission. Currently on SSI with CBGs 140-230s.  - Will add on Novolog  4U q4h for goal CBG <180.   Best practice:  Diet: tube feeds Pain/Anxiety/Delirium protocol (if indicated): per protocol VAP protocol (if indicated): per protocol DVT prophylaxis: SCDs GI prophylaxis: PPI Glucose control: Novolog + SSI Mobility: bed rest Code Status: FULL Family Communication: patient's daughter updated by primary on acute change in condition Disposition: ICU  Labs   CBC: Recent Labs  Lab 05/24/20 0404 05/25/20 0107 05/25/20 1757 05/26/20 0351 05/27/20 0343  WBC 7.2 12.7* 17.7* 15.2* 15.7*  HGB 12.2 11.2* 9.5* 8.5* 7.9*  HCT 37.5 34.6* 28.9* 25.3* 24.4*  MCV 88.4 85.9 87.0 86.9 87.8  PLT 320 317 283 253 098    Basic Metabolic Panel: Recent Labs  Lab 05/24/20 0404 05/25/20 0107  05/26/20 0351 05/27/20 0343  NA 139 137 139 140  K 3.4* 4.0 3.4* 3.9  CL 101 101 102 104  CO2 26 24 27 26   GLUCOSE 173* 196* 142* 145*  BUN 16 20 24* 24*  CREATININE 1.15* 1.32* 1.36* 1.37*  CALCIUM 8.8* 8.7* 8.3* 8.4*   GFR: Estimated Creatinine Clearance: 47.8 mL/min (A) (by C-G formula based on SCr of 1.37 mg/dL (H)). Recent Labs  Lab 05/25/20 0107 05/25/20 1757 05/26/20 0351 05/27/20 0343  WBC 12.7* 17.7* 15.2* 15.7*    Liver Function Tests: No results for input(s): AST, ALT, ALKPHOS, BILITOT, PROT, ALBUMIN in the last 168 hours. No results for input(s): LIPASE, AMYLASE in the last 168 hours. No results for input(s): AMMONIA in the last 168 hours.  ABG    Component Value Date/Time   PHART 7.363 11/21/2019 1555   PCO2ART 52.2 (H) 11/21/2019 1555   PO2ART 94.0 11/21/2019 1555   HCO3 29.7 (H) 11/21/2019 1555   TCO2 27 03/27/2020 0602   O2SAT 97.0 11/21/2019 1555     Coagulation Profile: No results for input(s): INR, PROTIME in the last 168 hours.  Cardiac Enzymes: No results for input(s): CKTOTAL, CKMB, CKMBINDEX, TROPONINI in the last 168 hours.  HbA1C: Hgb A1c MFr Bld  Date/Time Value Ref Range Status  05/24/2020 05:03 PM 7.3 (H) 4.8 - 5.6 % Final    Comment:    (NOTE) Pre diabetes:          5.7%-6.4%  Diabetes:              >6.4%  Glycemic control for   <7.0% adults with diabetes   03/27/2020 05:55 AM 7.1 (H) 4.8 - 5.6 % Final    Comment:    (NOTE) Pre diabetes:          5.7%-6.4%  Diabetes:              >6.4%  Glycemic control for   <7.0% adults with diabetes     CBG: Recent Labs  Lab 05/26/20 1610 05/26/20 2120 05/27/20 0810 05/27/20 1140 05/27/20 1429  GLUCAP 122* 168* 161* 233* 185*    Review of Systems:   Unable to attain at this time due to altered mental status and emergency condition.   Past Medical History  She,  has a past medical history of Alterations of sensations, late effect of cerebrovascular disease(438.6),  Arthritis, Backache, unspecified, Body mass index 40.0-44.9, adult ([facility]), Carpal tunnel syndrome, Dizziness and giddiness, Esophageal reflux, Generalized pain, Headache, Heart murmur, Other abnormal glucose, Other and unspecified hyperlipidemia, Other malaise and fatigue, Other postprocedural status(V45.89), Other symptoms involving cardiovascular system, Sleep apnea, Stroke ([place]) (06/2003; 02/2010), Unspecified disorder of the pituitary gland and its hypothalamic control, Unspecified essential hypertension, and Unspecified visual disturbance.  Surgical History    Past Surgical History:  Procedure Laterality Date  . CARPAL TUNNEL RELEASE Left ~ 2014  . CORONARY STENT INTERVENTION N/A 05/24/2020   Procedure: CORONARY STENT INTERVENTION;  Surgeon: Jettie Booze, MD;  Location: [facility];  Service: Cardiovascular;  Laterality: N/A;  . JOINT REPLACEMENT    . LEFT HEART CATH AND CORONARY ANGIOGRAPHY N/A 05/24/2020   Procedure: LEFT HEART CATH AND CORONARY ANGIOGRAPHY;  Surgeon: Charolette Forward, MD;  Location: [facility];  Service: Cardiovascular;  Laterality: N/A;  . TOTAL HIP ARTHROPLASTY Right 11/06/2010  . TRANSPHENOIDAL / TRANSNASAL HYPOPHYSECTOMY / RESECTION PITUITARY TUMOR  06/13/2012  . TUBAL LIGATION  1974     Social History   reports that she has never smoked. She has never used smokeless tobacco. She reports current alcohol use. She reports that she does not use drugs.   Family History   Her family history includes Diabetes in her father; Heart failure in her mother and sister; Hypertension in her daughter, mother, and sister.   Allergies Allergies  Allergen Reactions  . Morphine And Related Hives and Nausea And Vomiting  . Penicillins Hives    Has patient had a PCN reaction causing immediate rash, facial/tongue/throat swelling, SOB or lightheadedness with hypotension: Yes Has patient had a PCN reaction causing severe rash involving mucus membranes or skin  necrosis: No Has patient had a PCN reaction that required hospitalization: No Has patient had a PCN reaction occurring within the last 10 years: No If all of the above answers are "NO", then may proceed with Cephalosporin use.  Marland Kitchen Percocet [Oxycodone-Acetaminophen] Nausea And Vomiting  . Ace Inhibitors Nausea And Vomiting and Cough  . Iodinated Diagnostic Agents Nausea And Vomiting       . Iodine Itching  . Etodolac Nausea And Vomiting  . Tramadol Nausea And Vomiting  . Vicodin [Hydrocodone-Acetaminophen] Nausea And Vomiting     Home Medications  Prior to Admission medications   Medication Sig Start Date End Date Taking? Authorizing Provider  acetaminophen (TYLENOL) 500 MG tablet Take 1,000 mg by mouth every 6 (six) hours as needed for headache (pain).   Yes [provider]  albuterol (PROVENTIL HFA;VENTOLIN HFA) 108 (90 Base) MCG/ACT inhaler Inhale 2 puffs into the lungs every 6 (six) hours as needed for wheezing. Patient taking differently: Inhale 2 puffs into the lungs every 6 (six) hours as needed for wheezing or shortness of breath.  02/24/18  Yes Danford, Suann Larry, MD  amLODipine (NORVASC) 10 MG tablet Take 1 tablet (10 mg total) by mouth daily. Patient taking differently: Take 10 mg by mouth daily at 2 PM.  05/07/19  Yes Adhikari, Tamsen Meek, MD  aspirin 81 MG chewable tablet Chew 81 mg by mouth daily.   Yes [provider]  atorvastatin (LIPITOR) 80 MG tablet Take 1 tablet (80 mg total) by mouth daily. Patient taking differently: Take 80 mg by mouth every Thursday.  02/24/18  Yes Danford, Suann Larry, MD  budesonide-formoterol (SYMBICORT) 160-4.5 MCG/ACT inhaler Inhale 2 puffs into the lungs 2 (two) times daily. 11/24/19  Yes Shelly Coss, MD  carvedilol (COREG) 6.25 MG tablet Take 6.25 mg by mouth 2 (two) times daily. 12/01/19  Yes [provider]  cloNIDine (CATAPRES) 0.3 MG tablet Take 1 tablet (0.3 mg total) by mouth 2 (two) times daily. Patient  taking differently: Take 0.3 mg by mouth 2 (two) times daily. at 2pm and after supper 05/07/19  Yes Shelly Coss, MD  clopidogrel (PLAVIX)  75 MG tablet Take 1 tablet (75 mg total) by mouth daily. 02/24/18  Yes Danford, Suann Larry, MD  furosemide (LASIX) 40 MG tablet Take 1 tablet (40 mg total) by mouth daily at 2 PM. Patient taking differently: Take 40 mg by mouth daily.  11/24/19 03/27/29 Yes Shelly Coss, MD  hydrALAZINE (APRESOLINE) 25 MG tablet Take 1 tablet (25 mg total) by mouth every 8 (eight) hours. 03/28/20 05/24/20 Yes Pahwani, Einar Grad, MD  ipratropium-albuterol (DUONEB) 0.5-2.5 (3) MG/3ML SOLN Take 3 mLs by nebulization every 4 (four) hours as needed. Patient taking differently: Take 3 mLs by nebulization every 4 (four) hours as needed (for breathing).  11/24/19  Yes Shelly Coss, MD  potassium chloride (KLOR-CON) 10 MEQ tablet Take 2 tablets (20 mEq total) by mouth daily. Patient taking differently: Take 20 mEq by mouth daily at 2 PM.  07/16/19  Yes Marianna Payment, MD  valsartan (DIOVAN) 320 MG tablet Take 320 mg by mouth daily.  08/26/19  Yes [provider]  guaiFENesin (MUCINEX) 600 MG 12 hr tablet Take 2 tablets (1,200 mg total) by mouth 2 (two) times daily. Patient not taking: Reported on 03/27/2020 11/24/19   Shelly Coss, MD  metFORMIN (GLUCOPHAGE) 500 MG tablet Take 1 tablet (500 mg total) by mouth 2 (two) times daily with a meal. 05/26/20 05/26/21  Charolette Forward, MD  nitroGLYCERIN (NITROSTAT) 0.4 MG SL tablet Place 1 tablet (0.4 mg total) under the tongue every 5 (five) minutes x 3 doses as needed for chest pain. 05/25/20   Charolette Forward, MD     Critical care time: 8 minutes    Harvie Heck, MD Internal Medicine, PGY-2 05/27/20 2:58 PM Pager # [phone]

## 2020-05-27 NOTE — Progress Notes (Signed)
Patient has been c/o severe pain to right groin not relieved by PRN Tylenol. The pain worsened when she got up to use the [facility] and has remained 10/10. Patient refusing scheduled Oxycodone stating she is allergic to the medication. Patient called her daughter crying, daughter asked if CT scan can be done to see why the patient is in so much pain. Dr Terrence Dupont paged with the above information, and ordered  CBC, PRN Tramadol 50 mg and Maalox. Medications given per order, Zofran also given, Will continue to monitor patient.

## 2020-05-27 NOTE — Progress Notes (Signed)
Lower extremity arterial duplex to r/o pseudoaneurysm has been completed.   Preliminary results in CV Proc.   Abram Sander 05/27/2020 10:55 AM

## 2020-05-27 NOTE — Significant Event (Signed)
Rapid Response Event Note   Reason for Call :  Forced right gaze. Pt not responding to voice or painful stimuli. Irregular breathing pattern.  Per RN, pt has been resting since approximately 1030/1100 this morning. Staff entered room to find pt as listed above. Pt received Tramadol at 0300, she was confused this morning. Pt also vomiting this morning.   Initial Focused Assessment:  Pt lying in bed. No response to pain in upper extremities. Withdrawals to pain in bilateral lower extremities. Pupils are 71mm, non-reactive. Right, lateral forced gaze. Pt noted to be posturing. Eyes intermittently fluttering. No gag reflex. Lung sounds are clear. Breathing pattern irregular and heavy. Pt skin is cool and clammy. Dtr. Harwani requested code stroke be called. Due to concern for pt's airway neurology and PCCM were called to bedside.  Pt had a sudden drop in BP. Pt right lower abdomen/groin now enlarged and firm. Per primary RN, site was soft and not swollen earlier today. Upon my arrival site was not enlarged.   VS: BP 121/56 > 64/41, HR 82, RR 33, SpO2 98% on room air  CBG 185  Interventions:  -IVF bolus initiated -Dopamine gtt initiated per Dr. Terrence Dupont -Neurology to bedside -PCCM to bedside  Pt requiring intubation for airway protection, transferred to ICU.  Event Summary:  MD Notified: Dr. Terrence Dupont Call Time: [place] Time: 3810 End Time: [place], RN

## 2020-05-27 NOTE — Progress Notes (Signed)
PCCM Family Communication Note  I spoke with pt family member Rodena Piety at bedside upon transfer to ICU. We discussed  -Acute encephalopathy and inability to protect airway requiring emergent intubation  -shock requiring peripheral pressors and likelihood of requiring central line given NE running at max peripheral dose. We discussed formally consenting for this upon return from [place] and suspicion of worsening hematoma -plans to obtain STAT CT H, CT a/p, CBC, coags to guide further treatment All questions answered at that time and family exited unit with plans to discuss CT findings when resulted.   While leaving for STAT CT, pt blood pressure 68/38.   Decision immediately made to remain in ICU. I gave verbal order to increase peripheral NE beyond  "maximum" and decision made to proceed with emergent central line placement in setting of worsening shock.  See CVC procedure note for details.   Following CVC, pt remains on NE 40, dopamine 10. Hgb resulted at 7.3; 1 PRBC ordered emergent release.  Family arrived at bedside again and we discussed interval decompensation. Rodena Piety articulates frustration regarding these changes, emotional support provided. We discussed interval changes and reiterated above plan. All questions answered.    Eliseo Gum MSN, AGACNP-BC [place] [phone] If no answer, [phone] 05/27/2020, 5:17 PM

## 2020-05-27 NOTE — Procedures (Signed)
Arterial Catheter Insertion Procedure Note  ANALY BASSFORD  [id number]  05/29/1951  Date:05/27/20  Time:5:38 PM    Provider Performing: Sharla Kidney    Procedure: Insertion of Arterial Line [phone]) without US guidance  Indication(s) Blood pressure monitoring and/or need for frequent ABGs  Consent Risks of the procedure as well as the alternatives and risks of each were explained to the patient and/or caregiver.  Consent for the procedure was obtained and is signed in the bedside chart  Anesthesia None   Time Out Verified patient identification, verified procedure, site/side was marked, verified correct patient position, special equipment/implants available, medications/allergies/relevant history reviewed, required imaging and test results available.   Sterile Technique Maximal sterile technique including full sterile barrier drape, hand hygiene, sterile gown, sterile gloves, mask, hair covering, sterile ultrasound probe cover (if used).   Procedure Description Area of catheter insertion was cleaned with chlorhexidine and draped in sterile fashion. Without real-time ultrasound guidance an arterial catheter was placed into the right radial artery.  Appropriate arterial tracings confirmed on monitor.     Complications/Tolerance None; patient tolerated the procedure well.   EBL Minimal   Specimen(s) None   Consent for procedure obtained with family at bedside by Dr. Ander Slade.

## 2020-05-27 NOTE — Progress Notes (Signed)
Tramadol given was effective. Patient fell asleep and is still in bed with eyes closed.

## 2020-05-27 NOTE — Progress Notes (Signed)
Subjective:  Patient denies any chest pain or shortness of breath denies any palpitations had severe lower abdominal pain last night responded to tramadol right groin is soft with no ecchymosis or bruit.  Hemoglobin  trending down.  Objective:  Vital Signs in the last 24 hours: Temp:  [98 F (36.7 C)-98.1 F (36.7 C)] 98 F (36.7 C) (08/13 0320) Pulse Rate:  [69-76] 69 (08/13 0320) Resp:  [20-22] 20 (08/13 0320) BP: (132-151)/(52-68) 151/68 (08/13 0320) SpO2:  [95 %-100 %] 98 % (08/13 0320) Weight:  [116.3 kg] 116.3 kg (08/13 0320)  Intake/Output from previous day: 08/12 0701 - 08/13 0700 In: 483 [P.O.:480; I.V.:3] Out: -  Intake/Output from this shift: No intake/output data recorded.  Physical Exam: Neck: no adenopathy, no carotid bruit, no JVD and supple, symmetrical, trachea midline Lungs: Clear to auscultation anterolaterally Heart: irregularly irregular rhythm, S1, S2 normal and Soft systolic murmur noted Abdomen: soft, non-tender; bowel sounds normal; no masses,  no organomegaly Extremities: extremities normal, atraumatic, no cyanosis or edema and Right groin soft no obvious hematoma ecchymosis or bruit noted  Lab Results: Recent Labs    05/26/20 0351 05/27/20 0343  WBC 15.2* 15.7*  HGB 8.5* 7.9*  PLT 253 230   Recent Labs    05/26/20 0351 05/27/20 0343  NA 139 140  K 3.4* 3.9  CL 102 104  CO2 27 26  GLUCOSE 142* 145*  BUN 24* 24*  CREATININE 1.36* 1.37*   No results for input(s): TROPONINI in the last 72 hours.  Invalid input(s): CK, MB Hepatic Function Panel No results for input(s): PROT, ALBUMIN, AST, ALT, ALKPHOS, BILITOT, BILIDIR, IBILI in the last 72 hours. Recent Labs    05/25/20 0107  CHOL 198   No results for input(s): PROTIME in the last 72 hours.  Imaging: Imaging results have been reviewed and CT ABDOMEN PELVIS WO CONTRAST  Result Date: 05/25/2020 CLINICAL DATA:  Unspecified abdominal pain, status post catheterization, right groin  pain EXAM: CT ABDOMEN AND PELVIS WITHOUT CONTRAST TECHNIQUE: Multidetector CT imaging of the abdomen and pelvis was performed following the standard protocol without IV contrast. COMPARISON:  None. FINDINGS: Lower chest: Mild ground-glass pulmonary infiltrate seen within the visualized lung bases bilaterally is nonspecific but may represent trace pulmonary edema. Cardiac size within normal limits. No pericardial effusion. Hepatobiliary: Layering hyperdensity within the gallbladder lumen may represent vicarious excretion of contrast or layering hyperdense sludge or stones. No superimposed pericholecystic inflammatory change. The liver is unremarkable. No intra or extrahepatic biliary ductal dilation. Pancreas: Unremarkable Spleen: Unremarkable Adrenals/Urinary Tract: Adrenal glands are unremarkable. Kidneys are normal in size and position. Simple cortical cyst noted within the a interpolar region of the left kidney. No hydronephrosis. No intrarenal or ureteral calculi. The bladder is largely obscured by streak artifact from right total hip arthroplasty, however, hyperdense material within the bladder lumen likely represents excreted contrast given the patient's history of recent catheterization. Stomach/Bowel: Stomach is within normal limits. Appendix appears normal. No evidence of bowel wall thickening, distention, or inflammatory changes. Vascular/Lymphatic: A hematoma extends from the right common femoral vascular bundle coursing within the anterior abdominal wall superolaterally between the external and internal oblique muscles. This hematoma measures roughly 18.9 x 7.0 x 5.6 cm in greatest dimension.s there is thickening of the external oblique and internal oblique muscles likely real obtained to intramuscular hemorrhage or edema. There is no extension of the hematoma into the retroperitoneum or below the transversus abdominus musculature into the peritoneal cavity itself. There is infiltration involving  the  right lower quadrant abdominal wall and extending into the right inguinal crease in keeping with subcutaneous hemorrhage or edema. Mild scattered aortoiliac atherosclerotic plaque. No aneurysm. No pathologic abdominal or pelvic adenopathy. Reproductive: The pelvic organs are unremarkable. No adnexal masses. Other: Rectum unremarkable. Musculoskeletal: Right total hip arthroplasty has been performed. Degenerative changes are seen within the lumbar spine. No acute bone abnormality. IMPRESSION: 1. Large hematoma extending from the right common femoral vascular bundle coursing within the anterior abdominal wall superolaterally between the external and internal oblique muscles. This hematoma measures roughly 18.9 x 7.0 x 5.6 cm. There is no extension of the hematoma into the retroperitoneum or below the transversus abdominus musculature into the peritoneal cavity itself. 2. Mild ground-glass pulmonary infiltrate within the visualized lung bases bilaterally is nonspecific but may represent trace pulmonary edema. 3. Layering hyperdensity within the gallbladder lumen may represent vicarious excretion of contrast or layering hyperdense sludge or stones. No superimposed pericholecystic inflammatory change. Aortic Atherosclerosis (ICD10-I70.0). Electronically Signed   By: Fidela Salisbury MD   On: 05/25/2020 23:16    Cardiac Studies:  Assessment/Plan:  Status post acute non-ST elevation MI, status post left cardiac catheterization/PTCA stenting to mid LAD, doing well New onset A. fib with controlled ventricular response Right large anterior abdominal wall hematoma Acute blood loss anemia secondary to above Uncontrolled hypertension Type 2 diabetes mellitus Hyperlipidemia History of TIA/CVA Morbid obesity Obstructive sleep apnea History of gouty arthritis Plan Add low-dose amiodarone as per orders Duplex ultrasound of the right groin rule out pseudoaneurysm May need vascular surgical consult if positive for  pseudoaneurysm and cannot be sealed off by ultrasounded guided compression. Check labs in a.m.  LOS: 3 days    Charolette Forward 05/27/2020, 7:48 AM

## 2020-05-27 NOTE — Procedures (Addendum)
Intubation Procedure Note  Sherry Fitzpatrick  [id number]  1951-03-26  Date:05/27/20  Time:3:10 PM   Provider Performing:Eliseo Withers A Gwynn Chalker    Procedure: Intubation (17356)  Indication(s) Respiratory Failure   Consent Unable to obtain consent due to emergent nature of procedure.   Anesthesia Etomidate, Versed, Fentanyl and Rocuronium  she received 10 of etomidate, 2 mg of Versed, 100 of fentanyl, 30 mg of rocuronium  Time Out Verified patient identification, verified procedure, site/side was marked, verified correct patient position, special equipment/implants available, medications/allergies/relevant history reviewed, required imaging and test results available.   Sterile Technique Usual hand hygeine, masks, and gloves were used   Procedure Description Patient positioned in bed supine.  Sedation given as noted above.  Patient was intubated with endotracheal tube using MacIntosh 4 blade.  View was Grade 2 only posterior commissure .  Number of attempts was 1.  Colorimetric CO2 detector was consistent with tracheal placement.   Complications/Tolerance None; patient tolerated the procedure well. Chest X-ray is ordered to verify placement.   EBL None   Specimen(s) None

## 2020-05-27 NOTE — Progress Notes (Signed)
Patient noted with change in respiratory status and unresponsive to verbal, tactile, and sternal rub while rounding after daughter came to nursing station notifying this RN she was leaving. Rapid Response RN called to bedside to assist. Dr Terrence Dupont paged and came in to evalaute. Phoned but unable to reach Daughter Rodena Piety who had gone off floor few minutes. Called and notified Candace, the other daughter of change in mother's status. Intubated at the bedside. Transferred to [facility]. Provided family with room number and Unit phone  Number. Elita Boone, BSN, RN

## 2020-05-27 NOTE — Procedures (Signed)
Central Venous Catheter Insertion Procedure Note Sherry Fitzpatrick [id number] 1951-09-30  Procedure: Insertion of Central Venous Catheter Indications: Assessment of intravascular volume, Drug and/or fluid administration and Frequent blood sampling  Procedure Details Consent: Unable to obtain consent because of emergent medical necessity. - Hypovolemic shock, ABLA Time Out: Verified patient identification, verified procedure, site/side was marked, verified correct patient position, special equipment/implants available, medications/allergies/relevent history reviewed, required imaging and test results available.  Performed  Maximum sterile technique was used including antiseptics, cap, gloves, gown, hand hygiene, mask and sheet. Skin prep: Chlorhexidine; local anesthetic administered A antimicrobial bonded/coated triple lumen catheter was placed in the left internal jugular vein using the Seldinger technique to 18 cm, biopatch applied, sutured in place.  Evaluation Blood flow good Complications: No apparent complications Patient did tolerate procedure well. Chest X-ray ordered to verify placement.  CXR: pending.   Procedure performed under direct supervision of Dr. Ander Fitzpatrick and with ultrasound guidance for real time vessel cannulation.     Sherry Gens, MSN, NP-C [facility] 05/27/2020, 4:41 PM   Please see [email or web address] for pager details.

## 2020-05-27 NOTE — Progress Notes (Signed)
Dr. Terrence Dupont made aware of hematoma increasing in size since pt arrive to unit. Dr. Terrence Dupont speaking to daughters now.

## 2020-05-27 NOTE — Progress Notes (Signed)
STAT EEG complete - results pending. ? ?

## 2020-05-27 NOTE — Progress Notes (Signed)
Subjective:  Called to see patient as she became suddenly unresponsive and had seizure activity and hypotension.  Patient noted to have right lateral gaze unresponsive to painful stimuli code stroke was called.  Patient was noted to be hypotensive with worsening abdominal wall swelling received IV fluid bolus and started on dopamine with improvement in her blood pressure.  Patient was intubated in the room and is being transferred to ICU.  Objective:  Vital Signs in the last 24 hours: Temp:  [97.7 F (36.5 C)-98.1 F (36.7 C)] 97.7 F (36.5 C) (08/13 0803) Pulse Rate:  [69-74] 73 (08/13 0803) Resp:  [20-22] 22 (08/13 0803) BP: (119-151)/(52-68) 119/64 (08/13 0803) SpO2:  [93 %-98 %] 93 % (08/13 0803) Weight:  [116.3 kg] 116.3 kg (08/13 0320)  Intake/Output from previous day: 08/12 0701 - 08/13 0700 In: 483 [P.O.:480; I.V.:3] Out: -  Intake/Output from this shift: No intake/output data recorded.  Physical Exam: Neck: no adenopathy, no carotid bruit, no JVD and supple, symmetrical, trachea midline Lungs: Clear anteriorly Heart: irregularly irregular rhythm and S1, S2 normal Abdomen: Distended right lower abdominal wall swelling notedRight groin no evidence of hematoma or bruit Extremities: extremities normal, atraumatic, no cyanosis or edema Neuro right lateral gaze unresponsive to painful stimuli  Lab Results: Recent Labs    05/26/20 0351 05/27/20 0343  WBC 15.2* 15.7*  HGB 8.5* 7.9*  PLT 253 230   Recent Labs    05/26/20 0351 05/27/20 0343  NA 139 140  K 3.4* 3.9  CL 102 104  CO2 27 26  GLUCOSE 142* 145*  BUN 24* 24*  CREATININE 1.36* 1.37*   No results for input(s): TROPONINI in the last 72 hours.  Invalid input(s): CK, MB Hepatic Function Panel No results for input(s): PROT, ALBUMIN, AST, ALT, ALKPHOS, BILITOT, BILIDIR, IBILI in the last 72 hours. Recent Labs    05/25/20 0107  CHOL 198   No results for input(s): PROTIME in the last 72  hours.  Imaging: Imaging results have been reviewed and CT ABDOMEN PELVIS WO CONTRAST  Result Date: 05/25/2020 CLINICAL DATA:  Unspecified abdominal pain, status post catheterization, right groin pain EXAM: CT ABDOMEN AND PELVIS WITHOUT CONTRAST TECHNIQUE: Multidetector CT imaging of the abdomen and pelvis was performed following the standard protocol without IV contrast. COMPARISON:  None. FINDINGS: Lower chest: Mild ground-glass pulmonary infiltrate seen within the visualized lung bases bilaterally is nonspecific but may represent trace pulmonary edema. Cardiac size within normal limits. No pericardial effusion. Hepatobiliary: Layering hyperdensity within the gallbladder lumen may represent vicarious excretion of contrast or layering hyperdense sludge or stones. No superimposed pericholecystic inflammatory change. The liver is unremarkable. No intra or extrahepatic biliary ductal dilation. Pancreas: Unremarkable Spleen: Unremarkable Adrenals/Urinary Tract: Adrenal glands are unremarkable. Kidneys are normal in size and position. Simple cortical cyst noted within the a interpolar region of the left kidney. No hydronephrosis. No intrarenal or ureteral calculi. The bladder is largely obscured by streak artifact from right total hip arthroplasty, however, hyperdense material within the bladder lumen likely represents excreted contrast given the patient's history of recent catheterization. Stomach/Bowel: Stomach is within normal limits. Appendix appears normal. No evidence of bowel wall thickening, distention, or inflammatory changes. Vascular/Lymphatic: A hematoma extends from the right common femoral vascular bundle coursing within the anterior abdominal wall superolaterally between the external and internal oblique muscles. This hematoma measures roughly 18.9 x 7.0 x 5.6 cm in greatest dimension.s there is thickening of the external oblique and internal oblique muscles likely real obtained to  intramuscular  hemorrhage or edema. There is no extension of the hematoma into the retroperitoneum or below the transversus abdominus musculature into the peritoneal cavity itself. There is infiltration involving the right lower quadrant abdominal wall and extending into the right inguinal crease in keeping with subcutaneous hemorrhage or edema. Mild scattered aortoiliac atherosclerotic plaque. No aneurysm. No pathologic abdominal or pelvic adenopathy. Reproductive: The pelvic organs are unremarkable. No adnexal masses. Other: Rectum unremarkable. Musculoskeletal: Right total hip arthroplasty has been performed. Degenerative changes are seen within the lumbar spine. No acute bone abnormality. IMPRESSION: 1. Large hematoma extending from the right common femoral vascular bundle coursing within the anterior abdominal wall superolaterally between the external and internal oblique muscles. This hematoma measures roughly 18.9 x 7.0 x 5.6 cm. There is no extension of the hematoma into the retroperitoneum or below the transversus abdominus musculature into the peritoneal cavity itself. 2. Mild ground-glass pulmonary infiltrate within the visualized lung bases bilaterally is nonspecific but may represent trace pulmonary edema. 3. Layering hyperdensity within the gallbladder lumen may represent vicarious excretion of contrast or layering hyperdense sludge or stones. No superimposed pericholecystic inflammatory change. Aortic Atherosclerosis (ICD10-I70.0). Electronically Signed   By: Fidela Salisbury MD   On: 05/25/2020 23:16   VAS [place] GROIN PSEUDOANEURYSM  Result Date: 05/27/2020  ARTERIAL PSEUDOANEURYSM  Exam: Right groin Indications: Patient complains of groin pain. History: S/p catheterization. Performing Technologist: Abram Sander RVS  Examination Guidelines: A complete evaluation includes B-mode imaging, spectral Doppler, color Doppler, and power Doppler as needed of all accessible portions of each vessel. Bilateral testing is  considered an integral part of a complete examination. Limited examinations for reoccurring indications may be performed as noted. +------------+----------+---------+------+----------+ Right DuplexPSV (cm/s)Waveform PlaqueComment(s) +------------+----------+---------+------+----------+ CFA            116    triphasic                 +------------+----------+---------+------+----------+  Summary: No evidence of pseudoaneurysm, AVF or DVT    --------------------------------------------------------------------------------    Preliminary     Cardiac Studies:  Assessment/Plan:  Acute mental status changes rule out CVA /rule out bleed Acute respiratory failure secondary to above Status post acute non-ST elevation MI, status post left cardiac catheterization/PTCA stenting to mid LAD,  New onset A. fib with controlled ventricular response Right large anterior abdominal wall hematoma Acute blood loss anemia secondary to above Uncontrolled hypertension Type 2 diabetes mellitus Hyperlipidemia History of TIA/CVA Morbid obesity Obstructive sleep apnea History of gouty arthritis Plan Transfer to CCU Patient will be transferred to critical care team Neuro consult Check labs We will get CT of the brain once stable Discussed with her daughter regarding change in her condition and critical condition and she is on her way to the hospital.  LOS: 3 days    Charolette Forward 05/27/2020, 2:57 PM

## 2020-05-27 NOTE — Progress Notes (Signed)
Pt transported on ventilator from [facility] to CT1 and back. RT and RN x2 accompanied pt. Vital signs remained stable throughout. RT will continue to monitor.

## 2020-05-27 NOTE — Progress Notes (Signed)
Pt arrived to unit intubated unresponsive on vent from [place]. Pressures dropping despite boluses and dopamine gtt infusing. Levophed gtt added. Pt with large hard area right side lower abdomen near right groin. Right pedal pulse weak.  Pt given one unit of blood hung emergently per order at 1645. See vital flow sheet for vitals. Verified with Medical laboratory scientific officer. CT ordered and unable to transport due to labile BP's. Central line placed and a line placed. Family at bedside and updated. Will continue to monitor closely.

## 2020-05-27 NOTE — Progress Notes (Addendum)
Pharmacy Antibiotic Note  Sherry Fitzpatrick is a 69 y.o. female admitted on 05/24/2020 with aspiration pneumonia.  Pharmacy has been consulted for vancomycin and cefepime dosing.  Patient transferred to ICU after decompensating. Now with concern for sepsis/aspriation pneumonia. Currently afebrile, wbc elevated at 23.7. Scr elevated at 2.37.     Plan: Vancomycin 2g now then 1500mg  IV every 48  hours.  Goal trough 15-20 mcg/mL. Cefepime 2g q24 hour  Height: 5\' 4"  (162.6 cm) Weight: 116.3 kg (256 lb 6.3 oz) IBW/kg (Calculated) : 54.7  Temp (24hrs), Avg:97.7 F (36.5 C), Min:97.5 F (36.4 C), Max:98.1 F (36.7 C)  Recent Labs  Lab 05/24/20 0404 05/24/20 0404 05/25/20 0107 05/25/20 0107 05/25/20 1757 05/26/20 0351 05/27/20 0343 05/27/20 1558 05/27/20 1900  WBC 7.2   < > 12.7*   < > 17.7* 15.2* 15.7* 14.8* 23.7*  CREATININE 1.15*  --  1.32*  --   --  1.36* 1.37* 2.37*  --    < > = values in this interval not displayed.    Estimated Creatinine Clearance: 27.7 mL/min (A) (by C-G formula based on SCr of 2.37 mg/dL (H)).    Allergies  Allergen Reactions  . Morphine And Related Hives and Nausea And Vomiting  . Penicillins Hives    Has patient had a PCN reaction causing immediate rash, facial/tongue/throat swelling, SOB or lightheadedness with hypotension: Yes Has patient had a PCN reaction causing severe rash involving mucus membranes or skin necrosis: No Has patient had a PCN reaction that required hospitalization: No Has patient had a PCN reaction occurring within the last 10 years: No If all of the above answers are "NO", then may proceed with Cephalosporin use.  Marland Kitchen Percocet [Oxycodone-Acetaminophen] Nausea And Vomiting  . Ace Inhibitors Nausea And Vomiting and Cough  . Iodinated Diagnostic Agents Nausea And Vomiting       . Iodine Itching  . Etodolac Nausea And Vomiting  . Tramadol Nausea And Vomiting  . Vicodin [Hydrocodone-Acetaminophen] Nausea And Vomiting    Thank  you for allowing pharmacy to be a part of this patient's care.  Erin Hearing PharmD., BCPS Clinical Pharmacist 05/27/2020 10:32 PM

## 2020-05-27 NOTE — Consult Note (Addendum)
Neurology Consultation  Reason for Consult: Altered mental status Referring Physician: Harwani  CC: Altered mental status  History is obtained from: Chart  HPI: ELIZABEHT SUTO is a 69 y.o. female with history of essential hypertension, stroke with no residual effects, hyperlipidemia, and heart murmur.  Patient initially presented to the hospital on 05/24/2020 with a diagnosis of non-STEMI.  Patient underwent a left cardiac catheterization/PTCA stenting to the mid LAD.  While in the hospital today neurology was consulted for altered mental status.  Upon entering the room patient was noted to have intermittent bilateral extensor posturing, right gaze deviation, weak gag and pupils mid and fixed.  At that point patient's blood pressure dropped to systolic of 64.  At that point in time rapid response immediately intervened with pressors.  In addition, it should be noted that patient was vomiting 8:42 am per chart review   LKW: 1055 to be confirmed tpa given?: no, patient too hemodynamically unstable Premorbid modified Rankin scale (mRS): 0  Past Medical History:  Diagnosis Date  . Alterations of sensations, late effect of cerebrovascular disease(438.6)   . Arthritis    "all over"  . Backache, unspecified   . Body mass index 40.0-44.9, adult ([place])   . Carpal tunnel syndrome   . Dizziness and giddiness   . Esophageal reflux   . Generalized pain   . Headache    "had alot til I had pituitary tumor removed"  . Heart murmur   . Other abnormal glucose   . Other and unspecified hyperlipidemia   . Other malaise and fatigue   . Other postprocedural status(V45.89)   . Other symptoms involving cardiovascular system   . Sleep apnea    "did test; they wanted me to get a CPAP but I never did get it" (04/14/2015) Does use inhaler at night (01 2021)  . Stroke ([place]) 06/2003; 02/2010   "minor; minor" ; denies residual on 04/14/2015  . Unspecified disorder of the pituitary gland and its hypothalamic  control   . Unspecified essential hypertension   . Unspecified visual disturbance     Family History  Problem Relation Age of Onset  . Heart failure Mother   . Hypertension Mother   . Diabetes Father   . Hypertension Sister   . Heart failure Sister   . Hypertension Daughter    Social History:   reports that she has never smoked. She has never used smokeless tobacco. She reports current alcohol use. She reports that she does not use drugs.  Medications  Current Facility-Administered Medications:  .  DOPamine (INTROPIN) 3.2-5 MG/ML-% infusion, , , ,  .  etomidate (AMIDATE) 2 MG/ML injection, , , ,  .  fentaNYL (SUBLIMAZE) 100 MCG/2ML injection, , , ,  .  midazolam (VERSED) 2 MG/2ML injection, , , ,  .  propofol (DIPRIVAN) 10 mg/mL bolus/IV push, , , ,  .  rocuronium bromide 100 MG/10ML SOSY, , , ,  .  sterile water (preservative free) injection, , , ,  .  succinylcholine (ANECTINE) 200 MG/10ML syringe, , , ,  .  vecuronium (NORCURON) 10 MG injection, , , ,  .  0.9 %  sodium chloride infusion, 250 mL, Intravenous, PRN, Larae Grooms S, MD .  acetaminophen (TYLENOL) tablet 1,000 mg, 1,000 mg, Oral, Q6H PRN, Charolette Forward, MD, 1,000 mg at 05/27/20 0820 .  albuterol (PROVENTIL) (2.5 MG/3ML) 0.083% nebulizer solution 2.5 mg, 2.5 mg, Nebulization, Q6H PRN, Charolette Forward, MD .  alum & mag hydroxide-simeth (MAALOX/MYLANTA) 200-200-20  MG/5ML suspension 30 mL, 30 mL, Oral, Q4H PRN, Charolette Forward, MD .  amiodarone (PACERONE) tablet 200 mg, 200 mg, Oral, Daily, Harwani, Mohan, MD .  amLODipine (NORVASC) tablet 10 mg, 10 mg, Oral, Daily, Charolette Forward, MD, 10 mg at 05/26/20 1104 .  aspirin chewable tablet 81 mg, 81 mg, Oral, Daily, Jettie Booze, MD, 81 mg at 05/26/20 1105 .  atorvastatin (LIPITOR) tablet 80 mg, 80 mg, Oral, Daily, Charolette Forward, MD, 80 mg at 05/26/20 1106 .  carvedilol (COREG) tablet 6.25 mg, 6.25 mg, Oral, BID, Charolette Forward, MD, 6.25 mg at 05/26/20 2222 .   Chlorhexidine Gluconate Cloth 2 % PADS 6 each, 6 each, Topical, Daily, Harwani, Mohan, MD .  clopidogrel (PLAVIX) tablet 75 mg, 75 mg, Oral, Daily, Charolette Forward, MD, 75 mg at 05/26/20 1103 .  diphenhydrAMINE (BENADRYL) capsule 25 mg, 25 mg, Oral, Q8H PRN, Charolette Forward, MD .  ferrous sulfate tablet 325 mg, 325 mg, Oral, TID WC, Harwani, Mohan, MD .  insulin aspart (novoLOG) injection 0-9 Units, 0-9 Units, Subcutaneous, TID WC, Charolette Forward, MD, 3 Units at 05/27/20 1225 .  irbesartan (AVAPRO) tablet 150 mg, 150 mg, Oral, Daily, Charolette Forward, MD, 150 mg at 05/26/20 1105 .  mometasone-formoterol (DULERA) 200-5 MCG/ACT inhaler 2 puff, 2 puff, Inhalation, BID, Charolette Forward, MD, 2 puff at 05/27/20 0809 .  nitroGLYCERIN (NITROSTAT) SL tablet 0.4 mg, 0.4 mg, Sublingual, Q5 Min x 3 PRN, Charolette Forward, MD, 0.4 mg at 05/24/20 1635 .  nitroGLYCERIN 50 mg in dextrose 5 % 250 mL (0.2 mg/mL) infusion, 0-200 mcg/min, Intravenous, Continuous, Charolette Forward, MD, Stopped at 05/24/20 1755 .  ondansetron (ZOFRAN) injection 4 mg, 4 mg, Intravenous, Q6H PRN, Jettie Booze, MD, 4 mg at 05/27/20 0319 .  oxyCODONE-acetaminophen (PERCOCET/ROXICET) 5-325 MG per tablet 1 tablet, 1 tablet, Oral, Q6H, Harwani, Mohan, MD .  pantoprazole (PROTONIX) EC tablet 40 mg, 40 mg, Oral, Daily, Charolette Forward, MD, 40 mg at 05/26/20 1104 .  polyethylene glycol (MIRALAX / GLYCOLAX) packet 17 g, 17 g, Oral, Daily, Charolette Forward, MD, 17 g at 05/26/20 1106 .  potassium chloride (KLOR-CON) CR tablet 20 mEq, 20 mEq, Oral, Daily, Charolette Forward, MD, 20 mEq at 05/26/20 1104 .  sodium chloride flush (NS) 0.9 % injection 3 mL, 3 mL, Intravenous, Q12H, Jettie Booze, MD, 3 mL at 05/26/20 2223 .  sodium chloride flush (NS) 0.9 % injection 3 mL, 3 mL, Intravenous, PRN, Jettie Booze, MD .  traMADol Veatrice Bourbon) tablet 50 mg, 50 mg, Oral, Q6H PRN, Charolette Forward, MD, 50 mg at 05/27/20 0319  ROS:  Unable to obtain due to  altered mental status.   Exam: Current vital signs: BP 119/64 (BP Location: Right Arm)   Pulse 73   Temp 97.7 F (36.5 C) (Oral)   Resp (!) 22   Ht 5\' 4"  (1.626 m)   Wt 116.3 kg   SpO2 93%   BMI 44.01 kg/m  Vital signs in last 24 hours: Temp:  [97.7 F (36.5 C)-98.1 F (36.7 C)] 97.7 F (36.5 C) (08/13 0803) Pulse Rate:  [69-74] 73 (08/13 0803) Resp:  [20-22] 22 (08/13 0803) BP: (119-151)/(52-68) 119/64 (08/13 0803) SpO2:  [93 %-98 %] 93 % (08/13 0803) Weight:  [116.3 kg] 116.3 kg (08/13 0320)   Constitutional: Appears well-developed and well-nourished.  Eyes: Right gaze deviation no scleral injection HENT: No obstruction, but patient foaming slightly at the mouth Head: Normocephalic.  Cardiovascular: Palpable radial pulse on the  left upper  extremity Respiratory: Extremely labored, rhonchorous GI: Distended.  Abdominal hematoma. Extremities: No joint deformities  Mental status: Patient does not open eyes to command or noxious stimulation in any extremity.  She is obtunded. Cranial nerves: Bilateral corneals are intact.  Bilateral pupils are mid dilated and not reactive, mildly irregular.  There is fixed right gaze deviation resistant to VOR.  Grimace is symmetric to noxious stimulation. Sensorimotor: No response to noxious stimulation in the bilateral lower extremities.  Initially was briefly withdrawing in the left upper extremity, was not able to test the right upper extremity as I had to leave the room to make space for rapid response.  Labs I have reviewed labs in epic and the results pertinent to this consultation are:   CBC    Component Value Date/Time   WBC 15.7 (H) 05/27/2020 0343   RBC 2.78 (L) 05/27/2020 0343   HGB 7.9 (L) 05/27/2020 0343   HCT 24.4 (L) 05/27/2020 0343   PLT 230 05/27/2020 0343   MCV 87.8 05/27/2020 0343   MCH 28.4 05/27/2020 0343   MCHC 32.4 05/27/2020 0343   RDW 15.9 (H) 05/27/2020 0343   LYMPHSABS 2.7 03/27/2020 0555   MONOABS 0.8  03/27/2020 0555   EOSABS 0.5 03/27/2020 0555   BASOSABS 0.1 03/27/2020 0555    CMP     Component Value Date/Time   NA 140 05/27/2020 0343   K 3.9 05/27/2020 0343   CL 104 05/27/2020 0343   CO2 26 05/27/2020 0343   GLUCOSE 145 (H) 05/27/2020 0343   BUN 24 (H) 05/27/2020 0343   CREATININE 1.37 (H) 05/27/2020 0343   CALCIUM 8.4 (L) 05/27/2020 0343   PROT 7.0 03/27/2020 0555   ALBUMIN 3.3 (L) 03/27/2020 0555   AST 17 03/27/2020 0555   ALT 15 03/27/2020 0555   ALKPHOS 57 03/27/2020 0555   BILITOT 0.5 03/27/2020 0555   GFRNONAA 39 (L) 05/27/2020 0343   GFRAA 45 (L) 05/27/2020 0343    Lipid Panel     Component Value Date/Time   CHOL 198 05/25/2020 0107   TRIG 67 05/25/2020 0107   HDL 40 (L) 05/25/2020 0107   CHOLHDL 5.0 05/25/2020 0107   VLDL 13 05/25/2020 0107   LDLCALC 145 (H) 05/25/2020 0107    Assessment: Ms. Broman is a 69 year old woman with a past medical history of recent intervention for STEMI.  Her neurological evaluation was concerning for brainstem dysfunction which may be in the setting of the hypoperfusion she was experiencing.  Initially, I wanted to pursue head CT and CTA to rule out basilar thrombus but given her hemodynamic instability, this was not safe.  Alternatively, she may have been having syncopal convulsions.  Recommendations: -HCT when stable  -Please reinvolve neurology once the patient is medically stabilized if her mental status is not improving with medical stabilization -STAT EEG to rule out seizure  [place] [phone]

## 2020-05-27 NOTE — Progress Notes (Signed)
Attempted lower extremity arterial duplex to r/o pseudoaneurysm, however patient is vomiting. Will attempt again later when patient is feeling better as schedule permits.  05/27/2020 8:42 AM Kelby Aline., MHA, RVT, RDCS, RDMS

## 2020-05-28 ENCOUNTER — Inpatient Hospital Stay (HOSPITAL_COMMUNITY): Payer: Medicare HMO

## 2020-05-28 DIAGNOSIS — D62 Acute posthemorrhagic anemia: Secondary | ICD-10-CM

## 2020-05-28 DIAGNOSIS — R578 Other shock: Secondary | ICD-10-CM | POA: Diagnosis not present

## 2020-05-28 DIAGNOSIS — I749 Embolism and thrombosis of unspecified artery: Secondary | ICD-10-CM

## 2020-05-28 DIAGNOSIS — J9601 Acute respiratory failure with hypoxia: Secondary | ICD-10-CM | POA: Diagnosis not present

## 2020-05-28 DIAGNOSIS — R569 Unspecified convulsions: Secondary | ICD-10-CM

## 2020-05-28 LAB — CBC
HCT: 24.1 % — ABNORMAL LOW (ref 36.0–46.0)
HCT: 28.1 % — ABNORMAL LOW (ref 36.0–46.0)
HCT: 28.8 % — ABNORMAL LOW (ref 36.0–46.0)
Hemoglobin: 8.3 g/dL — ABNORMAL LOW (ref 12.0–15.0)
Hemoglobin: 9.5 g/dL — ABNORMAL LOW (ref 12.0–15.0)
Hemoglobin: 9.7 g/dL — ABNORMAL LOW (ref 12.0–15.0)
MCH: 30 pg (ref 26.0–34.0)
MCH: 29.3 pg (ref 26.0–34.0)
MCH: 28.7 pg (ref 26.0–34.0)
MCHC: 34.4 g/dL (ref 30.0–36.0)
MCHC: 33.8 g/dL (ref 30.0–36.0)
MCHC: 33.7 g/dL (ref 30.0–36.0)
MCV: 87 fL (ref 80.0–100.0)
MCV: 86.7 fL (ref 80.0–100.0)
MCV: 85.2 fL (ref 80.0–100.0)
Platelets: 196 10*3/uL (ref 150–400)
Platelets: 178 10*3/uL (ref 150–400)
Platelets: 165 10*3/uL (ref 150–400)
RBC: 2.77 MIL/uL — ABNORMAL LOW (ref 3.87–5.11)
RBC: 3.24 MIL/uL — ABNORMAL LOW (ref 3.87–5.11)
RBC: 3.38 MIL/uL — ABNORMAL LOW (ref 3.87–5.11)
RDW: 15.9 % — ABNORMAL HIGH (ref 11.5–15.5)
RDW: 15.9 % — ABNORMAL HIGH (ref 11.5–15.5)
RDW: 16.1 % — ABNORMAL HIGH (ref 11.5–15.5)
WBC: 28.1 10*3/uL — ABNORMAL HIGH (ref 4.0–10.5)
WBC: 21.8 10*3/uL — ABNORMAL HIGH (ref 4.0–10.5)
WBC: 19.7 10*3/uL — ABNORMAL HIGH (ref 4.0–10.5)
nRBC: 0.7 % — ABNORMAL HIGH (ref 0.0–0.2)
nRBC: 0.8 % — ABNORMAL HIGH (ref 0.0–0.2)
nRBC: 0.7 % — ABNORMAL HIGH (ref 0.0–0.2)

## 2020-05-28 LAB — COMPREHENSIVE METABOLIC PANEL
ALT: 38 U/L (ref 0–44)
AST: 54 U/L — ABNORMAL HIGH (ref 15–41)
Albumin: 2.4 g/dL — ABNORMAL LOW (ref 3.5–5.0)
Alkaline Phosphatase: 43 U/L (ref 38–126)
Anion gap: 11 (ref 5–15)
BUN: 32 mg/dL — ABNORMAL HIGH (ref 8–23)
CO2: 21 mmol/L — ABNORMAL LOW (ref 22–32)
Calcium: 7.6 mg/dL — ABNORMAL LOW (ref 8.9–10.3)
Chloride: 107 mmol/L (ref 98–111)
Creatinine, Ser: 2.01 mg/dL — ABNORMAL HIGH (ref 0.44–1.00)
GFR calc Af Amer: 28 mL/min — ABNORMAL LOW (ref >60)
GFR calc non Af Amer: 25 mL/min — ABNORMAL LOW (ref >60)
Glucose, Bld: 158 mg/dL — ABNORMAL HIGH (ref 70–99)
Potassium: 4.1 mmol/L (ref 3.5–5.1)
Sodium: 139 mmol/L (ref 135–145)
Total Bilirubin: 0.9 mg/dL (ref 0.3–1.2)
Total Protein: 5.1 g/dL — ABNORMAL LOW (ref 6.5–8.1)

## 2020-05-28 LAB — TROPONIN I (HIGH SENSITIVITY)
Troponin I (High Sensitivity): 468 ng/L (ref <18)
Troponin I (High Sensitivity): 580 ng/L (ref <18)

## 2020-05-28 LAB — POCT I-STAT 7, (LYTES, BLD GAS, ICA,H+H)
Acid-base deficit: 5 mmol/L — ABNORMAL HIGH (ref 0.0–2.0)
Bicarbonate: 20.1 mmol/L (ref 20.0–28.0)
Calcium, Ion: 0.98 mmol/L — ABNORMAL LOW (ref 1.15–1.40)
HCT: 20 % — ABNORMAL LOW (ref 36.0–46.0)
Hemoglobin: 6.8 g/dL — CL (ref 12.0–15.0)
O2 Saturation: 100 %
Patient temperature: 98.1
Potassium: 3.5 mmol/L (ref 3.5–5.1)
Sodium: 144 mmol/L (ref 135–145)
TCO2: 21 mmol/L — ABNORMAL LOW (ref 22–32)
pCO2 arterial: 36.3 mmHg (ref 32.0–48.0)
pH, Arterial: 7.351 (ref 7.350–7.450)
pO2, Arterial: 193 mmHg — ABNORMAL HIGH (ref 83.0–108.0)

## 2020-05-28 LAB — URINALYSIS, ROUTINE W REFLEX MICROSCOPIC
Bilirubin Urine: NEGATIVE
Glucose, UA: NEGATIVE mg/dL
Ketones, ur: NEGATIVE mg/dL
Nitrite: NEGATIVE
Protein, ur: 100 mg/dL — AB
RBC / HPF: >50 RBC/hpf — ABNORMAL HIGH (ref 0–5)
Specific Gravity, Urine: 1.021 (ref 1.005–1.030)
WBC, UA: >50 WBC/hpf — ABNORMAL HIGH (ref 0–5)
pH: 5 (ref 5.0–8.0)

## 2020-05-28 LAB — TRIGLYCERIDES: Triglycerides: 192 mg/dL — ABNORMAL HIGH (ref <150)

## 2020-05-28 LAB — GLUCOSE, CAPILLARY
Glucose-Capillary: 128 mg/dL — ABNORMAL HIGH (ref 70–99)
Glucose-Capillary: 132 mg/dL — ABNORMAL HIGH (ref 70–99)
Glucose-Capillary: 142 mg/dL — ABNORMAL HIGH (ref 70–99)
Glucose-Capillary: 143 mg/dL — ABNORMAL HIGH (ref 70–99)
Glucose-Capillary: 156 mg/dL — ABNORMAL HIGH (ref 70–99)

## 2020-05-28 LAB — BRAIN NATRIURETIC PEPTIDE: B Natriuretic Peptide: 101.9 pg/mL — ABNORMAL HIGH (ref 0.0–100.0)

## 2020-05-28 LAB — PREPARE RBC (CROSSMATCH)

## 2020-05-28 MED ORDER — VASOPRESSIN 20 UNITS/100 ML INFUSION FOR SHOCK
0.0000 [IU]/min | INTRAVENOUS | Status: DC
  Administered 2020-05-28 (×3): 0.03 [IU]/min via INTRAVENOUS
  Filled 2020-05-28 (×3): qty 100

## 2020-05-28 MED ORDER — SODIUM CHLORIDE 0.9% IV SOLUTION: Freq: Once | INTRAVENOUS | Status: AC

## 2020-05-28 MED ORDER — HYDRALAZINE HCL 20 MG/ML IJ SOLN
INTRAMUSCULAR | Status: AC
  Administered 2020-05-28: 20 mg
  Filled 2020-05-28: qty 1

## 2020-05-28 MED ORDER — DEXMEDETOMIDINE HCL IN NACL 400 MCG/100ML IV SOLN
0.4000 ug/kg/h | INTRAVENOUS | Status: DC
  Administered 2020-05-28: 0.801 ug/kg/h via INTRAVENOUS
  Administered 2020-05-28 – 2020-05-29 (×3): 0.8 ug/kg/h via INTRAVENOUS
  Administered 2020-05-29: 0.6 ug/kg/h via INTRAVENOUS
  Filled 2020-05-28 (×6): qty 100

## 2020-05-28 MED ORDER — SODIUM CHLORIDE 0.9% IV SOLUTION: Freq: Once | INTRAVENOUS | Status: DC

## 2020-05-28 NOTE — Progress Notes (Signed)
[facility] Progress Note Patient Name: Sherry Fitzpatrick DOB: 1951-06-27 MRN: [id number]   Date of Service  05/28/2020  HPI/Events of Note  Hemoglobin 6.8  eICU Interventions  Transfuse 1 unit PRBC.        Kerry Kass Griffin Dewilde 05/28/2020, 5:42 AM

## 2020-05-28 NOTE — Progress Notes (Signed)
Sputum sample collected and sent to the lab.  

## 2020-05-28 NOTE — Progress Notes (Signed)
Attempted MRI pt unstable. BP dropped when came down to MRI. Will attempt again when pt is stable.

## 2020-05-28 NOTE — Progress Notes (Signed)
Pt transported from 2H07 to MRI and back with no complications.

## 2020-05-28 NOTE — Progress Notes (Signed)
Late entry: Waiting to here from stroke team to see is this pt needs LTM EEG

## 2020-05-28 NOTE — Progress Notes (Signed)
eLink Physician-Brief Progress Note Patient Name: Sherry Fitzpatrick DOB: May 16, 1951 MRN: [id number]   Date of Service  05/28/2020  HPI/Events of Note  Patient with hypotension down to SBP in 60's in MRI, CXR actually suggestive of pulmonary congestion / edema.  eICU Interventions  Check BNP,  Vasopressin ordered for additional pressor support, Hopefully patient can get MRI done with Levophed + Vasopressin in place.        Kerry Kass Meagen [place] 05/28/2020, 1:38 AM

## 2020-05-28 NOTE — Progress Notes (Signed)
Subjective:  Patient remains intubated and sedated on pressors.  MRI result noted.  Had significant drop in hemoglobin requiring multiple blood transfusions repeat CT of the abdomen showed large abdominal wall hematoma.  Duplex ultrasound showed common femoral artery triphasic waveform with no evidence of pseudoaneurysm or AV fistula.  Objective:  Vital Signs in the last 24 hours: Temp:  [97.5 F (36.4 C)-98.7 F (37.1 C)] 98.6 F (37 C) (08/14 0842) Pulse Rate:  [53-121] 63 (08/14 0745) Resp:  [16-33] 18 (08/14 0745) BP: (64-152)/(31-100) 97/56 (08/14 0700) SpO2:  [85 %-100 %] 100 % (08/14 0745) Arterial Line BP: (58-212)/(33-74) 156/70 (08/14 0745) FiO2 (%):  [50 %-100 %] 50 % (08/14 0630)  Intake/Output from previous day: 08/13 0701 - 08/14 0700 In: 3648.6 [I.V.:1448; Blood:473.3; IV Piggyback:1727.3] Out: 700 [Urine:700] Intake/Output from this shift: No intake/output data recorded.  Physical Exam: Neck: no adenopathy, no carotid bruit, no JVD and supple, symmetrical, trachea midline Lungs: Clear anteriorly decreased breath sound at bases with occasional rhonchi Heart: regular rate and rhythm and S1, S2 normal Abdomen: Mildly distended bowel sounds present Extremities: extremities normal, atraumatic, no cyanosis or edema and Right groin no hematoma or ecchymosis  Lab Results: Recent Labs    05/27/20 2324 05/27/20 2324 05/28/20 0052 05/28/20 0602  WBC 28.3*  --   --  28.1*  HGB 8.8*   < > 6.8* 8.3*  PLT 182  --   --  196   < > = values in this interval not displayed.   Recent Labs    05/27/20 1558 05/27/20 1907 05/28/20 0052 05/28/20 0602  NA 140   < > 144 139  K 4.1   < > 3.5 4.1  CL 107  --   --  107  CO2 22  --   --  21*  GLUCOSE 176*  --   --  158*  BUN 29*  --   --  32*  CREATININE 2.37*  --   --  2.01*   < > = values in this interval not displayed.   No results for input(s): TROPONINI in the last 72 hours.  Invalid input(s): CK, MB Hepatic Function  Panel Recent Labs    05/28/20 0602  PROT 5.1*  ALBUMIN 2.4*  AST 54*  ALT 38  ALKPHOS 43  BILITOT 0.9   No results for input(s): CHOL in the last 72 hours. No results for input(s): PROTIME in the last 72 hours.  Imaging: Imaging results have been reviewed and EEG  Result Date: 05/28/2020 Lora Havens, MD     05/28/2020  6:53 AM Patient Name: Sherry Fitzpatrick MRN: [id number] Epilepsy Attending: Lora Havens Referring Physician/Provider: Dr Lesleigh Noe Date: 05/27/2020 Duration: 22.47 mins Patient history: 69 year old female with a history of stroke with no residual deficits who presented on 8/10 with NSTEMI and underwent PTCA with stenting to mid-LAD. While in the hospital today neurology was consulted for altered mental status. Exam revealed intermittent bilateral extensor posturing, right gaze deviation, weak gag and pupils mid and fixed. EEG to evaluate for status. Level of alertness: comatose AEDs during EEG study: Propofol Technical aspects: This EEG study was done with scalp electrodes positioned according to the 10-20 International system of electrode placement. Electrical activity was acquired at a sampling rate of 500Hz  and reviewed with a high frequency filter of 70Hz  and a low frequency filter of 1Hz . EEG data were recorded continuously and digitally stored. Description:  EEG showed continuous generalized 2-3 Hz  delta slowing with overriding  13-15Hz  beta activity with irregular morphology distributed symmetrically and diffusely.  Hyperventilation and photic stimulation were not performed.   ABNORMALITY -Excessive beta, generalized -Continuous slow, generalized IMPRESSION: This study is suggestive of severe diffuse encephalopathy, nonspecific etiology but likely related to sedation. No seizures or epileptiform discharges were seen throughout the recording. Priyanka [name]   CT ABDOMEN PELVIS WO CONTRAST  Result Date: 05/27/2020 CLINICAL DATA:  69 year old female with hematoma  in the right groin. Status post catheterization. EXAM: CT ABDOMEN AND PELVIS WITHOUT CONTRAST TECHNIQUE: Multidetector CT imaging of the abdomen and pelvis was performed following the standard protocol without IV contrast. COMPARISON:  CT abdomen pelvis dated 05/25/2020. FINDINGS: Evaluation of this exam is limited in the absence of intravenous contrast. Lower chest: There are large areas of consolidative changes with air bronchograms involving the lower lobes with overall associated volume loss. This is new since the prior CT and favored to represent atelectasis, although pneumonia is not excluded. Clinical correlation is recommended. Calcification of the aortic annulus noted. No intra-abdominal free air or free fluid. Hepatobiliary: Fatty liver. No intrahepatic biliary ductal dilatation. The gallbladder is unremarkable. Pancreas: Unremarkable. No pancreatic ductal dilatation or surrounding inflammatory changes. Spleen: The spleen is small otherwise unremarkable. Adrenals/Urinary Tract: Mild left adrenal nodularity. The right adrenal glands are unremarkable. There is a 15 mm left renal interpolar cyst. There is no hydronephrosis or nephrolithiasis on either side. The visualized ureters and urinary bladder appear unremarkable. Stomach/Bowel: An enteric tube is noted with tip in the distal stomach. There is sigmoid diverticulosis without active inflammatory changes. No bowel obstruction or active inflammation. The appendix is normal. Vascular/Lymphatic: Mild aortoiliac atherosclerotic disease. The IVC is unremarkable. No portal venous gas. There is no adenopathy. Reproductive: The uterus is anteverted. Small calcified fibroids noted. No adnexal masses. Other: Right groin hematoma extending into the right lower anterior pelvic wall. The hematoma in the right groin measures approximately 6 x 12 cm (71/4) and the component of the hematoma in the anterior right pelvic wall measures approximately 4 x 8 cm (62/4). The  overall craniocaudal length of the hematoma measures approximately 18 cm. Overall the size of the hematoma is similar to the prior CT when accounting for difference in measurement technique and distribution of the hematoma between the 2 studies. Correlation with clinical exam and H/H levels and continued follow-up as clinically indicated recommended. A compression device noted over the right groin. Musculoskeletal: Right hip arthroplasty. Degenerative changes of the spine. No acute osseous pathology. IMPRESSION: 1. Large right groin hematoma relatively similar to the prior CT. Correlation with H/H levels and continued follow-up as clinically indicated. 2. No hydronephrosis or nephrolithiasis. 3. Sigmoid diverticulosis. No bowel obstruction. Normal appendix. 4. Aortic Atherosclerosis (ICD10-I70.0). Electronically Signed   By: Anner Crete M.D.   On: 05/27/2020 18:57   CT HEAD WO CONTRAST  Result Date: 05/27/2020 CLINICAL DATA:  69 year old female with altered mental status. EXAM: CT HEAD WITHOUT CONTRAST TECHNIQUE: Contiguous axial images were obtained from the base of the skull through the vertex without intravenous contrast. COMPARISON:  Head CT dated 03/27/2020. FINDINGS: Brain: The ventricles and sulci appropriate size for patient's age. Mild periventricular and deep white matter chronic microvascular ischemic changes noted. There is no acute intracranial hemorrhage. No mass effect or midline shift no extra-axial fluid collection. Vascular: No hyperdense vessel or unexpected calcification. Skull: Normal. Negative for fracture or focal lesion. Sinuses/Orbits: There is partial opacification of the right sphenoid sinus. The remainder of the  visualized paranasal sinuses and mastoid air cells are clear. Other: An endotracheal and an enteric tube are partially visualized. IMPRESSION: 1. No acute intracranial pathology. 2. Mild chronic microvascular ischemic changes. Electronically Signed   By: Anner Crete  M.D.   On: 05/27/2020 18:39   MR ANGIO HEAD WO CONTRAST  Result Date: 05/28/2020 CLINICAL DATA:  Initial evaluation for acute stroke. EXAM: MRI HEAD WITHOUT CONTRAST MRA HEAD WITHOUT CONTRAST MRA NECK WITHOUT CONTRAST TECHNIQUE: Multiplanar, multiecho pulse sequences of the brain and surrounding structures were obtained without intravenous contrast. Angiographic images of the Circle of Willis were obtained using MRA technique without intravenous contrast. Angiographic images of the neck were obtained using MRA technique without intravenous contrast. Carotid stenosis measurements (when applicable) are obtained utilizing NASCET criteria, using the distal internal carotid diameter as the denominator. COMPARISON:  Prior head CT from 05/27/2020. FINDINGS: MRI HEAD FINDINGS Brain: Cerebral volume within normal limits for age. Patchy T2/FLAIR hyperintensity seen within the periventricular and deep white matter of both cerebral hemispheres, most consistent with chronic small vessel ischemic disease, moderate in nature. Few scattered superimposed remote lacunar infarcts noted about the basal ganglia and hemispheric cerebral white matter. 12 mm area of restricted diffusion seen involving the right posterior splenium (series 6, image 76). Few additional punctate acute ischemic infarcts seen within the right cerebral white matter (series 6, images 78, 76). Probable additional small ischemic infarct involving the right hippocampus (series 6, image 71). Punctate left cerebellar infarct noted as well (series 6, image 61). No associated hemorrhage or mass effect. Gray-white matter differentiation otherwise maintained. No encephalomalacia to suggest chronic cortical infarction elsewhere within the brain. No evidence for acute or chronic intracranial hemorrhage. No mass lesion, midline shift or mass effect. No hydrocephalus or extra-axial fluid collection. Pituitary gland suprasellar region normal. Midline structures intact.  Vascular: Major intracranial vascular flow voids are maintained. Skull and upper cervical spine: Craniocervical junction within normal limits. Bone marrow signal intensity normal. No scalp soft tissue abnormality. Sinuses/Orbits: Globes and orbital soft tissues within normal limits. Moderate layering fluid noted within the dominant right sphenoid sinus. Layering fluid noted within the nasopharynx as well. Small bilateral mastoid effusions. Patient is intubated. Other: None. MRA HEAD FINDINGS ANTERIOR CIRCULATION: Examination somewhat degraded by motion artifact. Visualized distal cervical segments of the internal carotid arteries are both patent with antegrade flow. Petrous segments widely patent. Scattered atheromatous change throughout the carotid siphons with associated moderate to severe multifocal stenoses, most pronounced at the supraclinoid right ICA (series 7, image 94). Origin of the ophthalmic arteries not well seen on this exam. ICA termini perfused. Severe bilateral proximal A1 stenoses noted (series 1044, image 11). Normal anterior communicating artery complex. Moderate atheromatous irregularity throughout the ACAs which remain patent to their distal aspects. No M1 stenosis or occlusion. Grossly normal MCA bifurcations. Severe proximal left M2 stenosis, inferior division (series 1044, image 10). No proximal MCA branch occlusion. Additional moderate to advance atheromatous irregularity throughout the MCA branches bilaterally. POSTERIOR CIRCULATION: Vertebral arteries widely patent as they course into the cranial vault. Right vertebral widely patent to the takeoff of the right PICA. Right PICA patent. Distally, there is moderate to severe narrowing of the distal right V4 segment (series 7, image 39). On the left, the left PICA takes off early and appears to be grossly patent. Short-segment severe proximal left V4 stenosis noted (series 7, image 13). Left vertebral irregular distally but remains patent to  the vertebrobasilar junction. Atheromatous irregularity throughout the basilar artery without high-grade  stenosis. Left SCA widely patent. Severe stenosis at the origin of the right SCA (series 1056, image 12). Both PCAs primarily supplied via the basilar. Multifocal moderate to severe bilateral P2 stenoses noted. PCAs remain patent to their distal aspects. No intracranial aneurysm. MRA NECK FINDINGS AORTIC ARCH: Examination technically limited by lack of IV contrast. Visualized aortic arch within normal limits for caliber. Bovine branching pattern with common origin of the left common and brachiocephalic arteries noted. No hemodynamically significant stenosis seen about the origin of the great vessels. RIGHT CAROTID SYSTEM: Right CCA patent to the bifurcation without stenosis. Atheromatous irregularity at the origin of the right ICA with mild stenosis of approximately 30-40% by NASCET criteria. Right ICA mildly tortuous but patent distally to the skull base without stenosis, dissection, or occlusion. LEFT CAROTID SYSTEM: Left CCA patent from its origin to the bifurcation without stenosis. Atheromatous change at the origin of the left ICA with estimated 40% stenosis by NASCET criteria. Left ICA mildly tortuous but otherwise patent distally to the skull base without stenosis, dissection or occlusion. VERTEBRAL ARTERIES: Both vertebral arteries appear to arise from the subclavian arteries. Origins of the vertebral arteries not well assessed on this examination. Visualized vertebral arteries widely patent with symmetric antegrade flow, with no evidence for dissection or occlusion. IMPRESSION: MRI HEAD IMPRESSION: 1. Patchy small volume acute ischemic infarcts involving the right splenium and right cerebral white matter as above, with additional punctate acute ischemic left cerebellar infarct. No associated hemorrhage or mass effect. 2. Underlying age-related cerebral atrophy with moderate chronic microvascular ischemic  disease. MRA HEAD IMPRESSION: 1. Motion degraded exam. 2. Negative MRA for large vessel occlusion. 3. Extensive intracranial atherosclerotic disease with multifocal moderate to severe stenoses involving both the anterior and posterior circulation as detailed above. MRA NECK IMPRESSION: 1. Approximate 30-40% stenoses about the origins of both internal carotid arteries, left slightly worse than right. No other hemodynamically significant stenosis or other abnormality about either carotid artery system within the neck. 2. Wide patency of both vertebral arteries within the neck. Electronically Signed   By: Jeannine Boga M.D.   On: 05/28/2020 05:48   MR ANGIO NECK WO CONTRAST  Result Date: 05/28/2020 CLINICAL DATA:  Initial evaluation for acute stroke. EXAM: MRI HEAD WITHOUT CONTRAST MRA HEAD WITHOUT CONTRAST MRA NECK WITHOUT CONTRAST TECHNIQUE: Multiplanar, multiecho pulse sequences of the brain and surrounding structures were obtained without intravenous contrast. Angiographic images of the Circle of Willis were obtained using MRA technique without intravenous contrast. Angiographic images of the neck were obtained using MRA technique without intravenous contrast. Carotid stenosis measurements (when applicable) are obtained utilizing NASCET criteria, using the distal internal carotid diameter as the denominator. COMPARISON:  Prior head CT from 05/27/2020. FINDINGS: MRI HEAD FINDINGS Brain: Cerebral volume within normal limits for age. Patchy T2/FLAIR hyperintensity seen within the periventricular and deep white matter of both cerebral hemispheres, most consistent with chronic small vessel ischemic disease, moderate in nature. Few scattered superimposed remote lacunar infarcts noted about the basal ganglia and hemispheric cerebral white matter. 12 mm area of restricted diffusion seen involving the right posterior splenium (series 6, image 76). Few additional punctate acute ischemic infarcts seen within the  right cerebral white matter (series 6, images 78, 76). Probable additional small ischemic infarct involving the right hippocampus (series 6, image 71). Punctate left cerebellar infarct noted as well (series 6, image 61). No associated hemorrhage or mass effect. Gray-white matter differentiation otherwise maintained. No encephalomalacia to suggest chronic cortical infarction elsewhere within the  brain. No evidence for acute or chronic intracranial hemorrhage. No mass lesion, midline shift or mass effect. No hydrocephalus or extra-axial fluid collection. Pituitary gland suprasellar region normal. Midline structures intact. Vascular: Major intracranial vascular flow voids are maintained. Skull and upper cervical spine: Craniocervical junction within normal limits. Bone marrow signal intensity normal. No scalp soft tissue abnormality. Sinuses/Orbits: Globes and orbital soft tissues within normal limits. Moderate layering fluid noted within the dominant right sphenoid sinus. Layering fluid noted within the nasopharynx as well. Small bilateral mastoid effusions. Patient is intubated. Other: None. MRA HEAD FINDINGS ANTERIOR CIRCULATION: Examination somewhat degraded by motion artifact. Visualized distal cervical segments of the internal carotid arteries are both patent with antegrade flow. Petrous segments widely patent. Scattered atheromatous change throughout the carotid siphons with associated moderate to severe multifocal stenoses, most pronounced at the supraclinoid right ICA (series 7, image 94). Origin of the ophthalmic arteries not well seen on this exam. ICA termini perfused. Severe bilateral proximal A1 stenoses noted (series 1044, image 11). Normal anterior communicating artery complex. Moderate atheromatous irregularity throughout the ACAs which remain patent to their distal aspects. No M1 stenosis or occlusion. Grossly normal MCA bifurcations. Severe proximal left M2 stenosis, inferior division (series 1044,  image 10). No proximal MCA branch occlusion. Additional moderate to advance atheromatous irregularity throughout the MCA branches bilaterally. POSTERIOR CIRCULATION: Vertebral arteries widely patent as they course into the cranial vault. Right vertebral widely patent to the takeoff of the right PICA. Right PICA patent. Distally, there is moderate to severe narrowing of the distal right V4 segment (series 7, image 39). On the left, the left PICA takes off early and appears to be grossly patent. Short-segment severe proximal left V4 stenosis noted (series 7, image 13). Left vertebral irregular distally but remains patent to the vertebrobasilar junction. Atheromatous irregularity throughout the basilar artery without high-grade stenosis. Left SCA widely patent. Severe stenosis at the origin of the right SCA (series 1056, image 12). Both PCAs primarily supplied via the basilar. Multifocal moderate to severe bilateral P2 stenoses noted. PCAs remain patent to their distal aspects. No intracranial aneurysm. MRA NECK FINDINGS AORTIC ARCH: Examination technically limited by lack of IV contrast. Visualized aortic arch within normal limits for caliber. Bovine branching pattern with common origin of the left common and brachiocephalic arteries noted. No hemodynamically significant stenosis seen about the origin of the great vessels. RIGHT CAROTID SYSTEM: Right CCA patent to the bifurcation without stenosis. Atheromatous irregularity at the origin of the right ICA with mild stenosis of approximately 30-40% by NASCET criteria. Right ICA mildly tortuous but patent distally to the skull base without stenosis, dissection, or occlusion. LEFT CAROTID SYSTEM: Left CCA patent from its origin to the bifurcation without stenosis. Atheromatous change at the origin of the left ICA with estimated 40% stenosis by NASCET criteria. Left ICA mildly tortuous but otherwise patent distally to the skull base without stenosis, dissection or  occlusion. VERTEBRAL ARTERIES: Both vertebral arteries appear to arise from the subclavian arteries. Origins of the vertebral arteries not well assessed on this examination. Visualized vertebral arteries widely patent with symmetric antegrade flow, with no evidence for dissection or occlusion. IMPRESSION: MRI HEAD IMPRESSION: 1. Patchy small volume acute ischemic infarcts involving the right splenium and right cerebral white matter as above, with additional punctate acute ischemic left cerebellar infarct. No associated hemorrhage or mass effect. 2. Underlying age-related cerebral atrophy with moderate chronic microvascular ischemic disease. MRA HEAD IMPRESSION: 1. Motion degraded exam. 2. Negative MRA for large vessel  occlusion. 3. Extensive intracranial atherosclerotic disease with multifocal moderate to severe stenoses involving both the anterior and posterior circulation as detailed above. MRA NECK IMPRESSION: 1. Approximate 30-40% stenoses about the origins of both internal carotid arteries, left slightly worse than right. No other hemodynamically significant stenosis or other abnormality about either carotid artery system within the neck. 2. Wide patency of both vertebral arteries within the neck. Electronically Signed   By: Jeannine Boga M.D.   On: 05/28/2020 05:48   MR BRAIN WO CONTRAST  Result Date: 05/28/2020 CLINICAL DATA:  Initial evaluation for acute stroke. EXAM: MRI HEAD WITHOUT CONTRAST MRA HEAD WITHOUT CONTRAST MRA NECK WITHOUT CONTRAST TECHNIQUE: Multiplanar, multiecho pulse sequences of the brain and surrounding structures were obtained without intravenous contrast. Angiographic images of the Circle of Willis were obtained using MRA technique without intravenous contrast. Angiographic images of the neck were obtained using MRA technique without intravenous contrast. Carotid stenosis measurements (when applicable) are obtained utilizing NASCET criteria, using the distal internal carotid  diameter as the denominator. COMPARISON:  Prior head CT from 05/27/2020. FINDINGS: MRI HEAD FINDINGS Brain: Cerebral volume within normal limits for age. Patchy T2/FLAIR hyperintensity seen within the periventricular and deep white matter of both cerebral hemispheres, most consistent with chronic small vessel ischemic disease, moderate in nature. Few scattered superimposed remote lacunar infarcts noted about the basal ganglia and hemispheric cerebral white matter. 12 mm area of restricted diffusion seen involving the right posterior splenium (series 6, image 76). Few additional punctate acute ischemic infarcts seen within the right cerebral white matter (series 6, images 78, 76). Probable additional small ischemic infarct involving the right hippocampus (series 6, image 71). Punctate left cerebellar infarct noted as well (series 6, image 61). No associated hemorrhage or mass effect. Gray-white matter differentiation otherwise maintained. No encephalomalacia to suggest chronic cortical infarction elsewhere within the brain. No evidence for acute or chronic intracranial hemorrhage. No mass lesion, midline shift or mass effect. No hydrocephalus or extra-axial fluid collection. Pituitary gland suprasellar region normal. Midline structures intact. Vascular: Major intracranial vascular flow voids are maintained. Skull and upper cervical spine: Craniocervical junction within normal limits. Bone marrow signal intensity normal. No scalp soft tissue abnormality. Sinuses/Orbits: Globes and orbital soft tissues within normal limits. Moderate layering fluid noted within the dominant right sphenoid sinus. Layering fluid noted within the nasopharynx as well. Small bilateral mastoid effusions. Patient is intubated. Other: None. MRA HEAD FINDINGS ANTERIOR CIRCULATION: Examination somewhat degraded by motion artifact. Visualized distal cervical segments of the internal carotid arteries are both patent with antegrade flow. Petrous  segments widely patent. Scattered atheromatous change throughout the carotid siphons with associated moderate to severe multifocal stenoses, most pronounced at the supraclinoid right ICA (series 7, image 94). Origin of the ophthalmic arteries not well seen on this exam. ICA termini perfused. Severe bilateral proximal A1 stenoses noted (series 1044, image 11). Normal anterior communicating artery complex. Moderate atheromatous irregularity throughout the ACAs which remain patent to their distal aspects. No M1 stenosis or occlusion. Grossly normal MCA bifurcations. Severe proximal left M2 stenosis, inferior division (series 1044, image 10). No proximal MCA branch occlusion. Additional moderate to advance atheromatous irregularity throughout the MCA branches bilaterally. POSTERIOR CIRCULATION: Vertebral arteries widely patent as they course into the cranial vault. Right vertebral widely patent to the takeoff of the right PICA. Right PICA patent. Distally, there is moderate to severe narrowing of the distal right V4 segment (series 7, image 39). On the left, the left PICA takes off early and  appears to be grossly patent. Short-segment severe proximal left V4 stenosis noted (series 7, image 13). Left vertebral irregular distally but remains patent to the vertebrobasilar junction. Atheromatous irregularity throughout the basilar artery without high-grade stenosis. Left SCA widely patent. Severe stenosis at the origin of the right SCA (series 1056, image 12). Both PCAs primarily supplied via the basilar. Multifocal moderate to severe bilateral P2 stenoses noted. PCAs remain patent to their distal aspects. No intracranial aneurysm. MRA NECK FINDINGS AORTIC ARCH: Examination technically limited by lack of IV contrast. Visualized aortic arch within normal limits for caliber. Bovine branching pattern with common origin of the left common and brachiocephalic arteries noted. No hemodynamically significant stenosis seen about the  origin of the great vessels. RIGHT CAROTID SYSTEM: Right CCA patent to the bifurcation without stenosis. Atheromatous irregularity at the origin of the right ICA with mild stenosis of approximately 30-40% by NASCET criteria. Right ICA mildly tortuous but patent distally to the skull base without stenosis, dissection, or occlusion. LEFT CAROTID SYSTEM: Left CCA patent from its origin to the bifurcation without stenosis. Atheromatous change at the origin of the left ICA with estimated 40% stenosis by NASCET criteria. Left ICA mildly tortuous but otherwise patent distally to the skull base without stenosis, dissection or occlusion. VERTEBRAL ARTERIES: Both vertebral arteries appear to arise from the subclavian arteries. Origins of the vertebral arteries not well assessed on this examination. Visualized vertebral arteries widely patent with symmetric antegrade flow, with no evidence for dissection or occlusion. IMPRESSION: MRI HEAD IMPRESSION: 1. Patchy small volume acute ischemic infarcts involving the right splenium and right cerebral white matter as above, with additional punctate acute ischemic left cerebellar infarct. No associated hemorrhage or mass effect. 2. Underlying age-related cerebral atrophy with moderate chronic microvascular ischemic disease. MRA HEAD IMPRESSION: 1. Motion degraded exam. 2. Negative MRA for large vessel occlusion. 3. Extensive intracranial atherosclerotic disease with multifocal moderate to severe stenoses involving both the anterior and posterior circulation as detailed above. MRA NECK IMPRESSION: 1. Approximate 30-40% stenoses about the origins of both internal carotid arteries, left slightly worse than right. No other hemodynamically significant stenosis or other abnormality about either carotid artery system within the neck. 2. Wide patency of both vertebral arteries within the neck. Electronically Signed   By: Jeannine Boga M.D.   On: 05/28/2020 05:48   Portable Chest  xray  Result Date: 05/28/2020 CLINICAL DATA:  Status post endotracheal tube placement EXAM: PORTABLE CHEST 1 VIEW COMPARISON:  05/28/2019 FINDINGS: Cardiac shadow is enlarged but stable. Endotracheal tube, gastric catheter and left jugular central line are noted. The overall inspiratory effort is again poor with bibasilar atelectatic changes identified. Some perihilar opacities are noted as well likely representing atelectasis. IMPRESSION: No significant change from the prior study. Electronically Signed   By: Inez Catalina M.D.   On: 05/28/2020 06:34   DG CHEST PORT 1 VIEW  Result Date: 05/27/2020 CLINICAL DATA:  Intubation EXAM: PORTABLE CHEST 1 VIEW COMPARISON:  05/24/2020 chest radiograph and prior. FINDINGS: Endotracheal tube is approximately 2.1 cm above the carina. Enteric tube tip and side hole overlie the gastric body. Left IJ CVC tip overlies the superior cavoatrial junction. Left predominant perihilar and bibasilar patchy opacities. No pneumothorax or pleural effusion. Cardiomegaly. Coronary stent. IMPRESSION: ETT tip approximately 2.1 cm above the carina. Appropriately positioned enteric tube. Left IJ CVC tip overlies the superior cavoatrial junction. Perihilar and bibasilar opacities, unchanged.  Cardiomegaly. Electronically Signed   By: Primitivo Gauze M.D.   On: 05/27/2020  16:54   VAS [place] GROIN PSEUDOANEURYSM  Result Date: 05/27/2020  ARTERIAL PSEUDOANEURYSM  Exam: Right groin Indications: Patient complains of groin pain. History: S/p catheterization. Performing Technologist: Abram Sander RVS  Examination Guidelines: A complete evaluation includes B-mode imaging, spectral Doppler, color Doppler, and power Doppler as needed of all accessible portions of each vessel. Bilateral testing is considered an integral part of a complete examination. Limited examinations for reoccurring indications may be performed as noted. +------------+----------+---------+------+----------+ Right DuplexPSV  (cm/s)Waveform PlaqueComment(s) +------------+----------+---------+------+----------+ CFA            116    triphasic                 +------------+----------+---------+------+----------+  Summary: No evidence of pseudoaneurysm, AVF or DVT    --------------------------------------------------------------------------------    Preliminary     Cardiac Studies:  Assessment/Plan:  Acute ischemic CVA Status post hypotensive shock secondary to large anterior abdominal wall hematoma. Acute respiratory failure rule out aspiration pneumonia Marked leukocytosis  Status post acute non-ST elevation MI status post left cardiac catheterization/PTCA stenting to mid LAD Status post paroxysmal atrial fibrillation back in normal sinus rhythm. Acute blood loss anemia secondary to large anterior abdominal wall hematoma Acute kidney injury secondary to hypotension/hypoperfusion Hypertension Diabetes mellitus History of TIA/CVA in the past Morbid obesity Obstructive sleep apnea History of gouty arthritis Plan Continue present management Transfuse 1 more unit of packed RBCs.  To keep hematocrit above 30 in view of significant CAD and recent MI. Continue on dual antiplatelet medication for now in view of recent DES and also stroke, may need to stop if continues to have worsening hematoma requiring multiple packed RBCs.  Will discuss further with interventional cardiology. Wean off pressors as blood pressure tolerates Check labs  LOS: 4 days    Charolette Forward 05/28/2020, 10:04 AM

## 2020-05-28 NOTE — Progress Notes (Signed)
PCCM interval progress:  Pt rounded on overnight, CT with stable hematoma, remained on 67mcg Levophed and Dopamine with improving MAP. Dr. Terrence Dupont had just updated family with CT results.  Bedside echo done by Dr. Molinda Bailiff without obvious decreased EF or stunned myocardium.   Unclear whether her shock is purely hemorrhagic. CXR with developing infiltrates and has leukocytosis.     -obtain BCx2 -start Vanc/cefepime -trend troponin  Otilio Carpen Ebon Ketchum, PA-C

## 2020-05-28 NOTE — Progress Notes (Signed)
NAME:  Sherry Fitzpatrick, MRN:  [id number], DOB:  16-Feb-1951, LOS: 4 ADMISSION DATE:  05/24/2020, CONSULTATION DATE:  05/27/2020 REFERRING MD:  Terrence Dupont, MD CHIEF COMPLAINT:  Altered mental status   Brief History   69 year old female with PMHx of COPD, HTN, DMII, OSA, TIA/CVA, HLD, HFpEF who initially presented on 8/10 with chest pain radiating to arm with associated dyspnea and headache. She was admitted for NSTEMI and underwent LHC and coronary angiography with stenting to mid LAD on 8/10.  Patient noted to have altered mental status on 8/13. Acutely, had hypotension for which started on pressors. She was intubated for airway protection in setting of presumed stroke. Transferred to [place].  History of present illness   Sherry Fitzpatrick is a 69 year old female with past medical history of hypertension, diabetes mellitus, obstructive sleep apnea, TIA/CVA, diastolic congestive heart failure, morbid obesity, and COPD who initially presented on 8/10 with retrosternal chest pain radiating to the arm that woke her from sleep associated with dyspnea and headache. She was given aspirin and sublingual nitro by EMS and brought to the ED where EKG showed non NSR with ST-T wave changes in inferolateral leads with elevated hsTroponin 682>780. Patient was started on IV heparin and nitro with improvement in symptoms and underwent left heart catherization with PCI stenting to mid LAD with Dr. Terrence Dupont.   On morning of 8/13, patient with episode of emesis and severe lower abdominal pain and right groin pain relieved with tramadol. Later in the day, had episode of acute altered mental status with seizure activity and hypotension. She was noted to have right lateral gaze deviation for which code stroke activated. She was started on dopamine for hypotension. Also due to concerns of inability to protect her airways, patient intubated. Patient transferred to CCU and care assumed by [facility].  Past Medical History   Past Medical History:    Diagnosis Date  . Alterations of sensations, late effect of cerebrovascular disease(438.6)   . Arthritis    "all over"  . Backache, unspecified   . Body mass index 40.0-44.9, adult ([place])   . Carpal tunnel syndrome   . Dizziness and giddiness   . Esophageal reflux   . Generalized pain   . Headache    "had alot til I had pituitary tumor removed"  . Heart murmur   . Other abnormal glucose   . Other and unspecified hyperlipidemia   . Other malaise and fatigue   . Other postprocedural status(V45.89)   . Other symptoms involving cardiovascular system   . Sleep apnea    "did test; they wanted me to get a CPAP but I never did get it" (04/14/2015) Does use inhaler at night (01 2021)  . Stroke ([place]) 06/2003; 02/2010   "minor; minor" ; denies residual on 04/14/2015  . Unspecified disorder of the pituitary gland and its hypothalamic control   . Unspecified essential hypertension   . Unspecified visual disturbance    Significant Hospital Events   8/10 > admission 8/10 > LHC with PCI stent to mid-LAD  8/13 > acute AMS with seizure activity; intubated for airway protection and transferred to [facility] 8/14 MRI with stroke. Hypotensive overnight requiring pressors. Weaned off in am. Consults:  Cardiology Neurology  Procedures:  8/10 > LHC and PCI stenting to mid LAD 8/13 > ETT  Significant Diagnostic Tests:  CXR 8/10 > stable cardiomegaly; patchy interstitial and airspace opacities bilaterally - edema vs infection CT Abd/Pelvis wo Contrast 8/11 > large hematoma (18.9x7x5.6cm)  from R common femoral vascular bundle coursing within the anterior abdominal wall superolaterally between external and internal oblique muscles; no extension into peritoneum or below transversus abdominus musculature; mild ground glass pulmonary infiltrate within visualized lung bases bilaterally US Groin 8/13> No pseudoaneurysm CT A/P> Bibasilar atelectasis, right groin hematoma extending into right lower anterior pelvic  wall, similar to prior MR Angio Head and Neck wo contrast 8/14 >  Neg for large vessel occlusion. 30-40% ICA stenosis, patent vertebral arteries MRI Brain 8/14 > Patchy acute ischemic infarcts involving right splenium and right cerebral white matter with punctate ischemic left cerebellar infarct. EEG 8/14> Severe diffuse encephalopathy. No seizure activity Micro Data:  SARS CoV2 8/10 > Negative MRSA PCR 8/10 > Negative   Antimicrobials:  None    Interim history/subjective:  Overnight, MRI completed. During imaging, became hypotensive. Additional vasopressor support ordered by Elink. Now weaned off of vasopressors. Transfused 1U PRBC overnight with appropriate response  Objective   Blood pressure (!) 97/56, pulse 63, temperature 98.6 F (37 C), temperature source Oral, resp. rate 18, height 5\' 4"  (1.626 m), weight 116.3 kg, SpO2 100 %.    Vent Mode: PRVC FiO2 (%):  [50 %-100 %] 50 % Set Rate:  [18 bmp] 18 bmp Vt Set:  [430 mL] 430 mL PEEP:  [5 cmH20] 5 cmH20 Plateau Pressure:  [20 cmH20-28 cmH20] 26 cmH20   Intake/Output Summary (Last 24 hours) at 05/28/2020 0934 Last data filed at 05/28/2020 0700 Gross per 24 hour  Intake 3648.56 ml  Output 700 ml  Net 2948.56 ml   Filed Weights   05/25/20 0518 05/26/20 0510 05/27/20 0320  Weight: 109.2 kg 115.2 kg 116.3 kg   Physical Exam: General: Obese female on mechanical ventilation HENT: [place], AT, ETT in place Eyes: EOMI, PERRL, no scleral icterus Respiratory: Clear to auscultation bilaterally.  No crackles, wheezing or rales Cardiovascular: Irregularly irregular rate and rhythm, -M/R/G, no JVD GI: BS+, soft, nontender Extremities:-Edema,-tenderness Neuro: Opens eyes to voice, moves extremities x 4 spontaneously, does not follow commands  Resolved Hospital Problem list    Assessment & Plan:  Acute ischemic CVA: Confirmed on MRI 8/14 Seizure activity secondary to CVA: EEG neg for sz activity - Neurology following - Wean sedation  for RASS goal -1 - Continue neuro assessment as sedation is weaned  Acute hypoxic respiratory failure secondary compromised airway in setting of acute encephalopathy  Hx of COPD for which on symbicort, albuterol and duonebs at home. No evidence of hypercarbia - Wean vent settings for goal SpO2 >88% - VAP per protocol  Hemorrhagic shock secondary to anterior abdominal wall hematoma, cannot rule out sepsis from pulmonary source - Wean vasopressors for goal MAP >65 - Continue broad spectrum antibiotics. F/u final cultures - Transfuse for goal Hg >7  Large anterior abdominal wall hematoma  Acute blood loss anemia  Noted to have large anterior abdominal wall hematoma on CT Abdomen on 8/11 following LHC. Repeat CT A/P with large right groin hematoma stable in size - Trend CBC - Management of hemorrhagic shock as noted above  Recent NSTEMI s/p PTCA stenting to mid LAD New onset Atrial fibrillation Admitted with NSTEMI requiring stenting to mid LAD. On DAPT following this with plavix and ASA - Continue amiodarone PO  - Continue to monitor  - K >4, Mag >2 - Cardiology following, appreciate recommendations. Continue ASA/Plavix if Hg stable  Acute on chronic renal failure:  Appears to have baseline sCr of 1.15 on admission. Improving today - F/u BMP/UOP -  Avoid nephrotoxic agents as able   Diabetes mellitus  HbA1c 7.3 on admission. Currently on SSI with CBGs 140-230s.  - Will add on Novolog 4U q4h for goal CBG <180.   Best practice:  Diet: tube feeds Pain/Anxiety/Delirium protocol (if indicated): Precedex VAP protocol (if indicated): per protocol DVT prophylaxis: SCDs GI prophylaxis: PPI Glucose control: Novolog + SSI Mobility: bed rest Code Status: FULL Family Communication: Updated family, youngest daughter, at bedside on 8/14 Disposition: ICU  Labs   CBC: Recent Labs  Lab 05/27/20 0343 05/27/20 0343 05/27/20 1558 05/27/20 1558 05/27/20 1900 05/27/20 1907  05/27/20 2324 05/28/20 0052 05/28/20 0602  WBC 15.7*  --  14.8*  --  23.7*  --  28.3*  --  28.1*  HGB 7.9*   < > 7.3*   < > 9.3* 9.2* 8.8* 6.8* 8.3*  HCT 24.4*   < > 23.4*   < > 29.1* 27.0* 26.9* 20.0* 24.1*  MCV 87.8  --  90.3  --  89.5  --  87.1  --  87.0  PLT 230  --  225  --  193  --  182  --  196   < > = values in this interval not displayed.    Basic Metabolic Panel: Recent Labs  Lab 05/25/20 0107 05/25/20 0107 05/26/20 0351 05/26/20 0351 05/27/20 0343 05/27/20 1558 05/27/20 1907 05/28/20 0052 05/28/20 0602  NA 137   < > 139   < > 140 140 141 144 139  K 4.0   < > 3.4*   < > 3.9 4.1 4.4 3.5 4.1  CL 101  --  102  --  104 107  --   --  107  CO2 24  --  27  --  26 22  --   --  21*  GLUCOSE 196*  --  142*  --  145* 176*  --   --  158*  BUN 20  --  24*  --  24* 29*  --   --  32*  CREATININE 1.32*  --  1.36*  --  1.37* 2.37*  --   --  2.01*  CALCIUM 8.7*  --  8.3*  --  8.4* 8.0*  --   --  7.6*  MG  --   --   --   --   --  2.2  --   --   --    < > = values in this interval not displayed.   GFR: Estimated Creatinine Clearance: 32.6 mL/min (A) (by C-G formula based on SCr of 2.01 mg/dL (H)). Recent Labs  Lab 05/27/20 1558 05/27/20 1900 05/27/20 2324 05/28/20 0602  WBC 14.8* 23.7* 28.3* 28.1*    Liver Function Tests: Recent Labs  Lab 05/27/20 1558 05/28/20 0602  AST 40 54*  ALT 23 38  ALKPHOS 51 43  BILITOT 0.4 0.9  PROT 5.7* 5.1*  ALBUMIN 2.6* 2.4*   No results for input(s): LIPASE, AMYLASE in the last 168 hours. No results for input(s): AMMONIA in the last 168 hours.  ABG    Component Value Date/Time   PHART 7.351 05/28/2020 0052   PCO2ART 36.3 05/28/2020 0052   PO2ART 193 (H) 05/28/2020 0052   HCO3 20.1 05/28/2020 0052   TCO2 21 (L) 05/28/2020 0052   ACIDBASEDEF 5.0 (H) 05/28/2020 0052   O2SAT 100.0 05/28/2020 0052     Coagulation Profile: Recent Labs  Lab 05/27/20 1544  INR 1.3*    Cardiac Enzymes: No results for input(s): CKTOTAL,  CKMB,  CKMBINDEX, TROPONINI in the last 168 hours.  HbA1C: Hgb A1c MFr Bld  Date/Time Value Ref Range Status  05/24/2020 05:03 PM 7.3 (H) 4.8 - 5.6 % Final    Comment:    (NOTE) Pre diabetes:          5.7%-6.4%  Diabetes:              >6.4%  Glycemic control for   <7.0% adults with diabetes   03/27/2020 05:55 AM 7.1 (H) 4.8 - 5.6 % Final    Comment:    (NOTE) Pre diabetes:          5.7%-6.4%  Diabetes:              >6.4%  Glycemic control for   <7.0% adults with diabetes     CBG: Recent Labs  Lab 05/27/20 1429 05/27/20 1811 05/27/20 1950 05/27/20 2346 05/28/20 0839  GLUCAP 185* 128* 120* 83 128*    Critical care time: 45 minutes    The patient is critically ill with multiple organ systems failure and requires high complexity decision making for assessment and support, frequent evaluation and titration of therapies, application of advanced monitoring technologies and extensive interpretation of multiple databases.  Independent Critical Care Time: 31 Minutes.   Rodman Pickle, M.D. [facility] Pulmonary/Critical Care Medicine 05/28/2020 9:34 AM   Please see Amion for pager number to reach on-call Pulmonary and Critical Care Team.

## 2020-05-28 NOTE — Procedures (Signed)
Patient Name: Sherry Fitzpatrick  MRN: [id number]  Epilepsy Attending: Lora Havens  Referring Physician/Provider: Dr Lesleigh Noe Date: 05/27/2020 Duration: 22.47 mins  Patient history: 69 year old female with a history of stroke with no residual deficits who presented on 8/10 with NSTEMI and underwent PTCA with stenting to mid-LAD. While in the hospital today neurology was consulted for altered mental status. Exam revealed intermittent bilateral extensor posturing, right gaze deviation, weak gag and pupils mid and fixed. EEG to evaluate for status.  Level of alertness: comatose  AEDs during EEG study: Propofol  Technical aspects: This EEG study was done with scalp electrodes positioned according to the 10-20 International system of electrode placement. Electrical activity was acquired at a sampling rate of 500Hz  and reviewed with a high frequency filter of 70Hz  and a low frequency filter of 1Hz . EEG data were recorded continuously and digitally stored.   Description:  EEG showed continuous generalized 2-3 Hz delta slowing with overriding  13-15Hz  beta activity with irregular morphology distributed symmetrically and diffusely.  Hyperventilation and photic stimulation were not performed.     ABNORMALITY -Excessive beta, generalized -Continuous slow, generalized  IMPRESSION: This study is suggestive of severe diffuse encephalopathy, nonspecific etiology but likely related to sedation. No seizures or epileptiform discharges were seen throughout the recording.  Sherry Fitzpatrick [name]

## 2020-05-28 NOTE — Progress Notes (Signed)
Initial Nutrition Assessment  RD working remotely.  DOCUMENTATION CODES:   Morbid obesity  INTERVENTION:   -If unable to extubate within 48 hours of intubation and feeding access is established, recommend:  Initiate Vital High Protein @ 55 ml/hr (1320 ml/hr)  45 ml Prosource TF daily.    Tube feeding regimen provides 1360 kcal (100% of needs), 127 grams of protein, and 1104 ml of H2O.   NUTRITION DIAGNOSIS:   Inadequate oral intake related to inability to eat as evidenced by NPO status.  GOAL:   Patient will meet greater than or equal to 90% of their needs  MONITOR:   Vent status, Labs, Weight trends, Skin, I & O's  REASON FOR ASSESSMENT:   Consult Assessment of nutrition requirement/status  ASSESSMENT:   69 year old female with PMHx of COPD, HTN, DMII, OSA, TIA/CVA, HLD, HFpEF who initially presented on 8/10 with chest pain radiating to arm with associated dyspnea and headache. She was admitted for NSTEMI and underwent LHC and coronary angiography with stenting to mid LAD on 8/10. Patient noted to have altered mental status on 8/13. Acutely, had hypotension for which started on pressors. She was intubated for airway protection in setting of presumed stroke.  Pt admitted with acute ischemia CVA and hemorrhagic shock secondary to anterior abdominal wall hematoma.   Patient is currently intubated on ventilator support. No feeding access at this time.  Temp (24hrs), Avg:98.1 F (36.7 C), Min:97.5 F (36.4 C), Max:99 F (37.2 C)  MAP: 70  RD received consult for new TPN. Case discussed with RN to clarify order, as notes did not indicate any plans to start nutrition (and no active pharmacy order to start TPN). Per RN, no plans to initiate nutrition (EN or PN) today.   Reviewed wt hx; wt has been stable over the past year.   Medications reviewed and include miralax, precedex, pitressin, colace, and ferrous sulfate.   Lab Results  Component Value Date   HGBA1C 7.3  (H) 05/24/2020   PTA DM medications are .   Labs reviewed: CBGS: 128 (inpatient orders for glycemic control are 0-15 units insulin aspart TID with meals and 4 units insulin aspart every 4 hours).   Diet Order:   Diet Order            Diet NPO time specified  Diet effective now                 EDUCATION NEEDS:   No education needs have been identified at this time  Skin:  Skin Assessment: Reviewed RN Assessment  Last BM:  05/25/20  Height:   Ht Readings from Last 1 Encounters:  05/24/20 5\' 4"  (1.626 m)    Weight:   Wt Readings from Last 1 Encounters:  05/27/20 116.3 kg    Ideal Body Weight:  54.5 kg  BMI:  Body mass index is 44.01 kg/m.  Estimated Nutritional Needs:   Kcal:  1749-4496  Protein:  120-135 grams  Fluid:  > 1.3 L    Loistine Chance, RD, LDN, [place] Registered Dietitian II Certified Diabetes Care and Education Specialist Please refer to [facility] for RD and/or RD on-call/weekend/after hours pager

## 2020-05-28 NOTE — Progress Notes (Signed)
STROKE TEAM PROGRESS NOTE   HISTORY OF PRESENT ILLNESS (per record) Sherry Fitzpatrick is a 69 y.o. female with history of essential hypertension, stroke with no residual effects, hyperlipidemia, and heart murmur.  Patient initially presented to the hospital on 05/24/2020 with a diagnosis of non-STEMI.  Patient underwent a left cardiac catheterization/PTCA stenting to the mid LAD.  While in the hospital today neurology was consulted for altered mental status.  Upon entering the room patient was noted to have intermittent bilateral extensor posturing, right gaze deviation, weak gag and pupils mid and fixed.  At that point patient's blood pressure dropped to systolic of 64.  At that point in time rapid response immediately intervened with pressors. In addition, it should be noted that patient was vomiting 8:42 am per chart review  LKW: 1055 to be confirmed tpa given?: no, patient too hemodynamically unstable Premorbid modified Rankin scale (mRS): 0   INTERVAL HISTORY I have personally reviewed history of presenting illness, electronic medical records and imaging films in PACS.  Patient daughter is at the bedside.  She is intubated but awake and follows commands very well.  She moves all 4 extremities but right side appears weaker than the left.  Vital signs are stable.  MRI shows tiny splenium of corpus callosum and right parietal white matter infarct, right hippocampus, left cerebellum.  MR angiogram of the brain is motion degraded but shows no large vessel stenosis and MR angiogram of the neck showed 30 to 40% stenosis at the origin of both internal carotid arteries slightly worse on the left than the right.  Both vertebral arteries are widely patent   OBJECTIVE Vitals:   05/28/20 0842 05/28/20 1000 05/28/20 1053 05/28/20 1108  BP:   (!) 120/55 (!) 159/68  Pulse:   73   Resp:   (!) 23   Temp: 98.6 F (37 C) 98.3 F (36.8 C) 99 F (37.2 C) 98.3 F (36.8 C)  TempSrc: Oral Oral Oral Oral  SpO2:    99%   Weight:      Height:        CBC:  Recent Labs  Lab 05/27/20 2324 05/27/20 2324 05/28/20 0052 05/28/20 0602  WBC 28.3*  --   --  28.1*  HGB 8.8*   < > 6.8* 8.3*  HCT 26.9*   < > 20.0* 24.1*  MCV 87.1  --   --  87.0  PLT 182  --   --  196   < > = values in this interval not displayed.    Basic Metabolic Panel:  Recent Labs  Lab 05/27/20 1558 05/27/20 1907 05/28/20 0052 05/28/20 0602  NA 140   < > 144 139  K 4.1   < > 3.5 4.1  CL 107  --   --  107  CO2 22  --   --  21*  GLUCOSE 176*  --   --  158*  BUN 29*  --   --  32*  CREATININE 2.37*  --   --  2.01*  CALCIUM 8.0*  --   --  7.6*  MG 2.2  --   --   --    < > = values in this interval not displayed.    Lipid Panel:     Component Value Date/Time   CHOL 198 05/25/2020 0107   TRIG 192 (H) 05/28/2020 0602   HDL 40 (L) 05/25/2020 0107   CHOLHDL 5.0 05/25/2020 0107   VLDL 13 05/25/2020 0107   LDLCALC  145 (H) 05/25/2020 0107   HgbA1c:  Lab Results  Component Value Date   HGBA1C 7.3 (H) 05/24/2020   Urine Drug Screen:     Component Value Date/Time   LABOPIA NONE DETECTED 07/01/2018 0625   COCAINSCRNUR NONE DETECTED 07/01/2018 0625   LABBENZ NONE DETECTED 07/01/2018 0625   AMPHETMU NONE DETECTED 07/01/2018 0625   THCU NONE DETECTED 07/01/2018 0625   LABBARB NONE DETECTED 07/01/2018 0625    Alcohol Level No results found for: [place]  EEG 05/28/2020 ABNORMALITY  Excessive beta, generalized -Continuous slow, generalized  IMPRESSION:  This study is suggestive of severe diffuse encephalopathy, nonspecific etiology but likely related to sedation. No seizures or epileptiform discharges were seen throughout the recording.   CT ABDOMEN PELVIS WO CONTRAST 05/27/2020 IMPRESSION:  1. Large right groin hematoma relatively similar to the prior CT. Correlation with H/H levels and continued follow-up as clinically indicated.  2. No hydronephrosis or nephrolithiasis.  3. Sigmoid diverticulosis. No bowel  obstruction. Normal appendix.  4. Aortic Atherosclerosis (ICD10-I70.0).   CT HEAD WO CONTRAST 05/27/2020 IMPRESSION:  1. No acute intracranial pathology.  2. Mild chronic microvascular ischemic changes.    MR BRAIN WO CONTRAST MR ANGIO HEAD WO CONTRAST MR ANGIO NECK WO CONTRAST 05/28/2020  MRI HEAD  IMPRESSION:  1. Patchy small volume acute ischemic infarcts involving the right splenium and right cerebral white matter as above, with additional punctate acute ischemic left cerebellar infarct. No associated hemorrhage or mass effect.  2. Underlying age-related cerebral atrophy with moderate chronic microvascular ischemic disease.   MRA HEAD  IMPRESSION:  1. Motion degraded exam.  2. Negative MRA for large vessel occlusion.  3. Extensive intracranial atherosclerotic disease with multifocal moderate to severe stenoses involving both the anterior and posterior circulation as detailed above.   MRA NECK  IMPRESSION:  1. Approximate 30-40% stenoses about the origins of both internal carotid arteries, left slightly worse than right. No other hemodynamically significant stenosis or other abnormality about either carotid artery system within the neck.  2. Wide patency of both vertebral arteries within the neck.   Portable Chest xray 05/28/2020 IMPRESSION:  No significant change from the prior study.  DG CHEST PORT 1 VIEW 05/27/2020 IMPRESSION:  ETT tip approximately 2.1 cm above the carina. Appropriately positioned enteric tube. Left IJ CVC tip overlies the superior cavoatrial junction. Perihilar and bibasilar opacities, unchanged.  Cardiomegaly.   VAS [place] GROIN PSEUDOANEURYSM 05/27/2020 Summary: No evidence of pseudoaneurysm, AVF or DVT    --------------------------------------------------------------------------------     Preliminary    Transthoracic Echocardiogram  03/28/20 IMPRESSIONS  1. Left ventricular ejection fraction, by estimation, is 60 to 65%. The  left ventricle has  normal function. The left ventricle has no regional  wall motion abnormalities. There is moderate left ventricular hypertrophy.  Left ventricular diastolic  parameters are indeterminate.  2. Right ventricular systolic function is normal. The right ventricular  size is normal. There is normal pulmonary artery systolic pressure.  3. The mitral valve is normal in structure. Trivial mitral valve  regurgitation.  4. The aortic valve was not well visualized. Aortic valve regurgitation  is not visualized. Mild aortic valve stenosis. Vmax 2.4 m/s, MG 12 mmHg,  AVA 1.7 cm^2, DI 0.45   ECG - SR rate 64 BPM. (See cardiology reading for complete details)   PHYSICAL EXAM Blood pressure (!) 159/68, pulse 73, temperature 98.3 F (36.8 C), temperature source Oral, resp. rate (!) 23, height 5\' 4"  (1.626 m), weight 116.3 kg, SpO2 99 %.  Obese elderly African-American lady who is intubated not sedated. . Afebrile. Head is nontraumatic. Neck is supple without bruit.    Cardiac exam no murmur or gallop. Lungs are clear to auscultation. Distal pulses are well felt. Neurological Exam :  She is awake and alert.  Intubated not sedated.  She follows simple midline and one-step commands.  Extraocular movements appear full range.  She blinks to threat bilaterally.  Fundi not visualized.  Mild right lower facial asymmetry when she smiles.  Tongue midline.  Able to move left upper and lower extremity briskly off the bed.  Right hemiparesis but able to move right upper and lower extremity slightly off the bed.  Sensation appears intact bilaterally.  Plantars were equivocal.  Gait not tested     ASSESSMENT/PLAN Ms. KAYTLYNNE NEACE is a 69 y.o. female with history of essential hypertension, stroke with no residual effects, obesity, OSA, hyperlipidemia, and heart murmur admitted to [facility] 05/24/2020 with a diagnosis of non-STEMI -> left cardiac catheterization/PTCA stenting to the mid LAD on 05/24/20.  Neurology consulted 8/13  for AMS. On arrival pt found with intermittent bilateral extensor posturing, right gaze deviation, weak gag and pupils mid and fixed with hypotension - SBP 64.  She did not receive IV t-PA due to instability.  Stroke: Patchy small volume acute ischemic infarcts involving the right splenium and right cerebral white matter , right hippocampus with additional punctate acute ischemic left cerebellar infarct -likely embolic from new atrial fibrillation \  Resultant right hemiparesis code Stroke CT Head -      CT head - No acute intracranial pathology. Mild chronic microvascular ischemic changes.  MRI head - Patchy small volume acute ischemic infarcts involving the right splenium and right cerebral white matter as above, with additional punctate acute ischemic left cerebellar infarct. Underlying age-related cerebral atrophy with moderate chronic microvascular ischemic disease.   MRA head - Extensive intracranial atherosclerotic disease with multifocal moderate to severe stenoses involving both the anterior and posterior circulation.  MRA Neck - Approximate 30-40% stenoses about the origins of both internal carotid arteries, left slightly worse than right  CTA H&N - not ordered  CT Perfusion - not ordered  Carotid Doppler - pending  EEG - This study is suggestive of severe diffuse encephalopathy, nonspecific etiology but likely related to sedation. No seizures or epileptiform discharges were seen throughout the recording.   2D Echo - 03/28/20 - EF 60 - 65%. No cardiac source of emboli identified.   Sars Corona Virus 2 - negative  LDL - 145  HgbA1c - 7.3  UDS - not ordered  VTE prophylaxis - none Diet  Diet Order            Diet NPO time specified  Diet effective now                  Aspirin 81 mg and Plavix 75 mg daily prior to admission, now on Aspirin 81 mg and Plavix 75 mg daily  Patient counseled to be compliant with her antithrombotic medications  Ongoing aggressive  stroke risk factor management  Therapy recommendations:  pending  Disposition:  Pending  Hypertension  Home BP meds: Norvasc ; Coreg ; Catapres ; Apresoline ; Diovan  Current BP meds: vasopressin  Stable . Permissive hypertension (OK if < 220/120) but gradually normalize in 5-7 days if OK with Cardiology . Long-term BP goal normotensive  Hyperlipidemia  Home Lipid lowering medication: Lipitor 80 mg daily  LDL 145, goal < 70  Current lipid lowering medication: Lipitor 80 mg daily   Continue statin at discharge  Diabetes  Home diabetic meds: none   Current diabetic meds: insulin  HgbA1c 7.3, goal < 7.0 Recent Labs    05/27/20 1950 05/27/20 2346 05/28/20 0839  GLUCAP 120* 83 128*     Other Stroke Risk Factors  Advanced age  ETOH use, advised to drink no more than 1 alcoholic beverage per day.  Obesity, Body mass index is 44.01 kg/m., recommend weight loss, diet and exercise as appropriate   Hx stroke/TIA  Coronary artery disease  Obstructive sleep apnea,  Not on CPAP at home  Other Active Problems  Code status - Full code  Intubated CKD - stage 4 - creatinine - 2.37->2.01 Anemia - Hgb - 8.8->6.8->8.3  Groin hematoma Leukocytosis - WBC's 28.3->28.1 (afebrile) on Cefepime and Vancomycin Aortic Atherosclerosis (ICD10-I70.0).   Non STEMI - LAD stent 8/10  New onset atrial fibrillation   Hospital day # 4 She has developed by cerebral embolic infarcts from new onset A. fib.  Recommend extubate if tolerated.  Will need long-term anticoagulation later when stable.  Long discussion of the bedside with the patient and the daughter and answered questions.  Discussed with Dr. Rodman Pickle.This patient is critically ill and at significant risk of neurological worsening, death and care requires constant monitoring of vital signs, hemodynamics,respiratory and cardiac monitoring, extensive review of multiple databases, frequent neurological assessment,  discussion with family, other specialists and medical decision making of high complexity.I have made any additions or clarifications directly to the above note.This critical care time does not reflect procedure time, or teaching time or supervisory time of PA/NP/Med Resident etc but could involve care discussion time.  I spent 30 minutes of neurocritical care time  in the care of  this patient.   Antony Contras, MD  To contact Stroke Continuity provider, please refer to [email or web address] After hours, contact General Neurology

## 2020-05-28 NOTE — Progress Notes (Signed)
Pt transported form 2H07 to MRI.  Pt became hypotensive while in MRI, decision was made to transport pt back to [facility] and attempt MRI when pt is more stable.  Pt transported back to 7L27 with no complications.

## 2020-05-29 DIAGNOSIS — R578 Other shock: Secondary | ICD-10-CM | POA: Diagnosis not present

## 2020-05-29 DIAGNOSIS — R0689 Other abnormalities of breathing: Secondary | ICD-10-CM

## 2020-05-29 DIAGNOSIS — I639 Cerebral infarction, unspecified: Secondary | ICD-10-CM | POA: Diagnosis not present

## 2020-05-29 DIAGNOSIS — I6330 Cerebral infarction due to thrombosis of unspecified cerebral artery: Secondary | ICD-10-CM | POA: Insufficient documentation

## 2020-05-29 HISTORY — DX: Cerebral infarction due to thrombosis of unspecified cerebral artery: I63.30

## 2020-05-29 LAB — GLUCOSE, CAPILLARY
Glucose-Capillary: 106 mg/dL — ABNORMAL HIGH (ref 70–99)
Glucose-Capillary: 112 mg/dL — ABNORMAL HIGH (ref 70–99)
Glucose-Capillary: 113 mg/dL — ABNORMAL HIGH (ref 70–99)
Glucose-Capillary: 114 mg/dL — ABNORMAL HIGH (ref 70–99)
Glucose-Capillary: 123 mg/dL — ABNORMAL HIGH (ref 70–99)
Glucose-Capillary: 133 mg/dL — ABNORMAL HIGH (ref 70–99)

## 2020-05-29 LAB — CBC
HCT: 29.7 % — ABNORMAL LOW (ref 36.0–46.0)
HCT: 29.3 % — ABNORMAL LOW (ref 36.0–46.0)
Hemoglobin: 10.2 g/dL — ABNORMAL LOW (ref 12.0–15.0)
Hemoglobin: 9.7 g/dL — ABNORMAL LOW (ref 12.0–15.0)
MCH: 29.3 pg (ref 26.0–34.0)
MCH: 28.6 pg (ref 26.0–34.0)
MCHC: 34.3 g/dL (ref 30.0–36.0)
MCHC: 33.1 g/dL (ref 30.0–36.0)
MCV: 85.3 fL (ref 80.0–100.0)
MCV: 86.4 fL (ref 80.0–100.0)
Platelets: 182 10*3/uL (ref 150–400)
Platelets: 188 10*3/uL (ref 150–400)
RBC: 3.48 MIL/uL — ABNORMAL LOW (ref 3.87–5.11)
RBC: 3.39 MIL/uL — ABNORMAL LOW (ref 3.87–5.11)
RDW: 16.4 % — ABNORMAL HIGH (ref 11.5–15.5)
RDW: 16.4 % — ABNORMAL HIGH (ref 11.5–15.5)
WBC: 18.2 10*3/uL — ABNORMAL HIGH (ref 4.0–10.5)
WBC: 17.4 10*3/uL — ABNORMAL HIGH (ref 4.0–10.5)
nRBC: 0.5 % — ABNORMAL HIGH (ref 0.0–0.2)
nRBC: 0.4 % — ABNORMAL HIGH (ref 0.0–0.2)

## 2020-05-29 LAB — BASIC METABOLIC PANEL
Anion gap: 9 (ref 5–15)
BUN: 30 mg/dL — ABNORMAL HIGH (ref 8–23)
CO2: 21 mmol/L — ABNORMAL LOW (ref 22–32)
Calcium: 8.4 mg/dL — ABNORMAL LOW (ref 8.9–10.3)
Chloride: 109 mmol/L (ref 98–111)
Creatinine, Ser: 1.36 mg/dL — ABNORMAL HIGH (ref 0.44–1.00)
GFR calc Af Amer: 46 mL/min — ABNORMAL LOW (ref >60)
GFR calc non Af Amer: 39 mL/min — ABNORMAL LOW (ref >60)
Glucose, Bld: 129 mg/dL — ABNORMAL HIGH (ref 70–99)
Potassium: 3.9 mmol/L (ref 3.5–5.1)
Sodium: 139 mmol/L (ref 135–145)

## 2020-05-29 MED ORDER — AMIODARONE HCL 100 MG PO TABS
100.0000 mg | ORAL_TABLET | Freq: Every day | ORAL | Status: DC
  Administered 2020-05-30 – 2020-06-01 (×3): 100 mg
  Filled 2020-05-29 (×3): qty 1

## 2020-05-29 MED ORDER — LIP MEDEX EX OINT
TOPICAL_OINTMENT | CUTANEOUS | Status: DC | PRN
  Filled 2020-05-29: qty 7

## 2020-05-29 MED ORDER — ORAL CARE MOUTH RINSE
15.0000 mL | Freq: Two times a day (BID) | OROMUCOSAL | Status: DC
  Administered 2020-05-29 – 2020-05-31 (×5): 15 mL via OROMUCOSAL

## 2020-05-29 MED ORDER — RACEPINEPHRINE HCL 2.25 % IN NEBU: 0.2500 mL | INHALATION_SOLUTION | Freq: Once | RESPIRATORY_TRACT | Status: DC

## 2020-05-29 MED ORDER — FUROSEMIDE 10 MG/ML IJ SOLN
20.0000 mg | Freq: Once | INTRAMUSCULAR | Status: AC
  Administered 2020-05-29: 20 mg via INTRAVENOUS
  Filled 2020-05-29: qty 2

## 2020-05-29 MED ORDER — SODIUM CHLORIDE 0.9 % IV SOLN
2.0000 g | INTRAVENOUS | Status: AC
  Administered 2020-05-30 – 2020-05-31 (×2): 2 g via INTRAVENOUS
  Filled 2020-05-29 (×2): qty 20

## 2020-05-29 MED ORDER — PHENOL 1.4 % MT LIQD
1.0000 | OROMUCOSAL | Status: DC | PRN
  Filled 2020-05-29: qty 177

## 2020-05-29 MED ORDER — SODIUM CHLORIDE 0.9 % IV SOLN: 2.0000 g | INTRAVENOUS | Status: DC

## 2020-05-29 MED ORDER — ASPIRIN EC 81 MG PO TBEC
81.0000 mg | DELAYED_RELEASE_TABLET | Freq: Every day | ORAL | Status: DC
  Administered 2020-05-30 – 2020-06-01 (×3): 81 mg via ORAL
  Filled 2020-05-29 (×4): qty 1

## 2020-05-29 NOTE — Progress Notes (Signed)
[facility] Progress Note Patient Name: Sherry Fitzpatrick DOB: September 28, 1951 MRN: [id number]   Date of Service  05/29/2020  HPI/Events of Note  Called for BP 187/80 (114). Patient had an ischemic stroke earlier this admission. Per critical care note from today, plan for permissive hypertension up to max of 220/120.   eICU Interventions  No action at this time in light of above.     Intervention Category Major Interventions: Hypertension - evaluation and management  Charlott Rakes 05/29/2020, 3:36 AM

## 2020-05-29 NOTE — Progress Notes (Signed)
Subjective:  Patient successfully extubated earlier this morning doing well do not recall the events of last few days.  Denies any chest pain or shortness of breath.  Abdominal pain markedly improved.  Right groin stable hemoglobin is stable since yesterday.  Objective:  Vital Signs in the last 24 hours: Temp:  [97.6 F (36.4 C)-99 F (37.2 C)] 98.1 F (36.7 C) (08/15 0823) Pulse Rate:  [40-76] 56 (08/15 0832) Resp:  [17-28] 28 (08/15 0832) BP: (78-188)/(47-90) 184/90 (08/15 0600) SpO2:  [99 %-100 %] 100 % (08/15 0832) Arterial Line BP: (94-198)/(48-90) 190/80 (08/15 0700) FiO2 (%):  [40 %] 40 % (08/15 0745) Weight:  [119.3 kg] 119.3 kg (08/15 0400)  Intake/Output from previous day: 08/14 0701 - 08/15 0700 In: 1948.2 [I.V.:792.2; Blood:1051; IV Piggyback:105.1] Out: 635 [Urine:635] Intake/Output from this shift: No intake/output data recorded.  Physical Exam: Neck: no adenopathy, no carotid bruit, no JVD and supple, symmetrical, trachea midline Lungs: Decreased breath sound at bases with occasional rhonchi Heart: Bradycardic S1-S2 soft there is soft systolic murmur Abdomen: Soft distended bowel sounds present Extremities: extremities normal, atraumatic, no cyanosis or edema and Right groin no evidence of hematoma or ecchymosis  Lab Results: Recent Labs    05/28/20 1704 05/29/20 0348  WBC 19.7* 18.2*  HGB 9.7* 10.2*  PLT 165 182   Recent Labs    05/28/20 0602 05/29/20 0348  NA 139 139  K 4.1 3.9  CL 107 109  CO2 21* 21*  GLUCOSE 158* 129*  BUN 32* 30*  CREATININE 2.01* 1.36*   No results for input(s): TROPONINI in the last 72 hours.  Invalid input(s): CK, MB Hepatic Function Panel Recent Labs    05/28/20 0602  PROT 5.1*  ALBUMIN 2.4*  AST 54*  ALT 38  ALKPHOS 43  BILITOT 0.9   No results for input(s): CHOL in the last 72 hours. No results for input(s): PROTIME in the last 72 hours.  Imaging: Imaging results have been reviewed and EEG  Result  Date: 05/28/2020 Lora Havens, MD     05/28/2020  6:53 AM Patient Name: JULIEANN DRUMMONDS MRN: [id number] Epilepsy Attending: Lora Havens Referring Physician/Provider: Dr Lesleigh Noe Date: 05/27/2020 Duration: 22.47 mins Patient history: 69 year old female with a history of stroke with no residual deficits who presented on 8/10 with NSTEMI and underwent PTCA with stenting to mid-LAD. While in the hospital today neurology was consulted for altered mental status. Exam revealed intermittent bilateral extensor posturing, right gaze deviation, weak gag and pupils mid and fixed. EEG to evaluate for status. Level of alertness: comatose AEDs during EEG study: Propofol Technical aspects: This EEG study was done with scalp electrodes positioned according to the 10-20 International system of electrode placement. Electrical activity was acquired at a sampling rate of 500Hz  and reviewed with a high frequency filter of 70Hz  and a low frequency filter of 1Hz . EEG data were recorded continuously and digitally stored. Description:  EEG showed continuous generalized 2-3 Hz delta slowing with overriding  13-15Hz  beta activity with irregular morphology distributed symmetrically and diffusely.  Hyperventilation and photic stimulation were not performed.   ABNORMALITY -Excessive beta, generalized -Continuous slow, generalized IMPRESSION: This study is suggestive of severe diffuse encephalopathy, nonspecific etiology but likely related to sedation. No seizures or epileptiform discharges were seen throughout the recording. Priyanka [name]   CT ABDOMEN PELVIS WO CONTRAST  Result Date: 05/27/2020 CLINICAL DATA:  69 year old female with hematoma in the right groin. Status post catheterization. EXAM: CT  ABDOMEN AND PELVIS WITHOUT CONTRAST TECHNIQUE: Multidetector CT imaging of the abdomen and pelvis was performed following the standard protocol without IV contrast. COMPARISON:  CT abdomen pelvis dated 05/25/2020. FINDINGS:  Evaluation of this exam is limited in the absence of intravenous contrast. Lower chest: There are large areas of consolidative changes with air bronchograms involving the lower lobes with overall associated volume loss. This is new since the prior CT and favored to represent atelectasis, although pneumonia is not excluded. Clinical correlation is recommended. Calcification of the aortic annulus noted. No intra-abdominal free air or free fluid. Hepatobiliary: Fatty liver. No intrahepatic biliary ductal dilatation. The gallbladder is unremarkable. Pancreas: Unremarkable. No pancreatic ductal dilatation or surrounding inflammatory changes. Spleen: The spleen is small otherwise unremarkable. Adrenals/Urinary Tract: Mild left adrenal nodularity. The right adrenal glands are unremarkable. There is a 15 mm left renal interpolar cyst. There is no hydronephrosis or nephrolithiasis on either side. The visualized ureters and urinary bladder appear unremarkable. Stomach/Bowel: An enteric tube is noted with tip in the distal stomach. There is sigmoid diverticulosis without active inflammatory changes. No bowel obstruction or active inflammation. The appendix is normal. Vascular/Lymphatic: Mild aortoiliac atherosclerotic disease. The IVC is unremarkable. No portal venous gas. There is no adenopathy. Reproductive: The uterus is anteverted. Small calcified fibroids noted. No adnexal masses. Other: Right groin hematoma extending into the right lower anterior pelvic wall. The hematoma in the right groin measures approximately 6 x 12 cm (71/4) and the component of the hematoma in the anterior right pelvic wall measures approximately 4 x 8 cm (62/4). The overall craniocaudal length of the hematoma measures approximately 18 cm. Overall the size of the hematoma is similar to the prior CT when accounting for difference in measurement technique and distribution of the hematoma between the 2 studies. Correlation with clinical exam and H/H  levels and continued follow-up as clinically indicated recommended. A compression device noted over the right groin. Musculoskeletal: Right hip arthroplasty. Degenerative changes of the spine. No acute osseous pathology. IMPRESSION: 1. Large right groin hematoma relatively similar to the prior CT. Correlation with H/H levels and continued follow-up as clinically indicated. 2. No hydronephrosis or nephrolithiasis. 3. Sigmoid diverticulosis. No bowel obstruction. Normal appendix. 4. Aortic Atherosclerosis (ICD10-I70.0). Electronically Signed   By: Anner Crete M.D.   On: 05/27/2020 18:57   CT HEAD WO CONTRAST  Result Date: 05/27/2020 CLINICAL DATA:  69 year old female with altered mental status. EXAM: CT HEAD WITHOUT CONTRAST TECHNIQUE: Contiguous axial images were obtained from the base of the skull through the vertex without intravenous contrast. COMPARISON:  Head CT dated 03/27/2020. FINDINGS: Brain: The ventricles and sulci appropriate size for patient's age. Mild periventricular and deep white matter chronic microvascular ischemic changes noted. There is no acute intracranial hemorrhage. No mass effect or midline shift no extra-axial fluid collection. Vascular: No hyperdense vessel or unexpected calcification. Skull: Normal. Negative for fracture or focal lesion. Sinuses/Orbits: There is partial opacification of the right sphenoid sinus. The remainder of the visualized paranasal sinuses and mastoid air cells are clear. Other: An endotracheal and an enteric tube are partially visualized. IMPRESSION: 1. No acute intracranial pathology. 2. Mild chronic microvascular ischemic changes. Electronically Signed   By: Anner Crete M.D.   On: 05/27/2020 18:39   MR ANGIO HEAD WO CONTRAST  Result Date: 05/28/2020 CLINICAL DATA:  Initial evaluation for acute stroke. EXAM: MRI HEAD WITHOUT CONTRAST MRA HEAD WITHOUT CONTRAST MRA NECK WITHOUT CONTRAST TECHNIQUE: Multiplanar, multiecho pulse sequences of the brain  and surrounding  structures were obtained without intravenous contrast. Angiographic images of the Circle of Willis were obtained using MRA technique without intravenous contrast. Angiographic images of the neck were obtained using MRA technique without intravenous contrast. Carotid stenosis measurements (when applicable) are obtained utilizing NASCET criteria, using the distal internal carotid diameter as the denominator. COMPARISON:  Prior head CT from 05/27/2020. FINDINGS: MRI HEAD FINDINGS Brain: Cerebral volume within normal limits for age. Patchy T2/FLAIR hyperintensity seen within the periventricular and deep white matter of both cerebral hemispheres, most consistent with chronic small vessel ischemic disease, moderate in nature. Few scattered superimposed remote lacunar infarcts noted about the basal ganglia and hemispheric cerebral white matter. 12 mm area of restricted diffusion seen involving the right posterior splenium (series 6, image 76). Few additional punctate acute ischemic infarcts seen within the right cerebral white matter (series 6, images 78, 76). Probable additional small ischemic infarct involving the right hippocampus (series 6, image 71). Punctate left cerebellar infarct noted as well (series 6, image 61). No associated hemorrhage or mass effect. Gray-white matter differentiation otherwise maintained. No encephalomalacia to suggest chronic cortical infarction elsewhere within the brain. No evidence for acute or chronic intracranial hemorrhage. No mass lesion, midline shift or mass effect. No hydrocephalus or extra-axial fluid collection. Pituitary gland suprasellar region normal. Midline structures intact. Vascular: Major intracranial vascular flow voids are maintained. Skull and upper cervical spine: Craniocervical junction within normal limits. Bone marrow signal intensity normal. No scalp soft tissue abnormality. Sinuses/Orbits: Globes and orbital soft tissues within normal limits.  Moderate layering fluid noted within the dominant right sphenoid sinus. Layering fluid noted within the nasopharynx as well. Small bilateral mastoid effusions. Patient is intubated. Other: None. MRA HEAD FINDINGS ANTERIOR CIRCULATION: Examination somewhat degraded by motion artifact. Visualized distal cervical segments of the internal carotid arteries are both patent with antegrade flow. Petrous segments widely patent. Scattered atheromatous change throughout the carotid siphons with associated moderate to severe multifocal stenoses, most pronounced at the supraclinoid right ICA (series 7, image 94). Origin of the ophthalmic arteries not well seen on this exam. ICA termini perfused. Severe bilateral proximal A1 stenoses noted (series 1044, image 11). Normal anterior communicating artery complex. Moderate atheromatous irregularity throughout the ACAs which remain patent to their distal aspects. No M1 stenosis or occlusion. Grossly normal MCA bifurcations. Severe proximal left M2 stenosis, inferior division (series 1044, image 10). No proximal MCA branch occlusion. Additional moderate to advance atheromatous irregularity throughout the MCA branches bilaterally. POSTERIOR CIRCULATION: Vertebral arteries widely patent as they course into the cranial vault. Right vertebral widely patent to the takeoff of the right PICA. Right PICA patent. Distally, there is moderate to severe narrowing of the distal right V4 segment (series 7, image 39). On the left, the left PICA takes off early and appears to be grossly patent. Short-segment severe proximal left V4 stenosis noted (series 7, image 13). Left vertebral irregular distally but remains patent to the vertebrobasilar junction. Atheromatous irregularity throughout the basilar artery without high-grade stenosis. Left SCA widely patent. Severe stenosis at the origin of the right SCA (series 1056, image 12). Both PCAs primarily supplied via the basilar. Multifocal moderate to  severe bilateral P2 stenoses noted. PCAs remain patent to their distal aspects. No intracranial aneurysm. MRA NECK FINDINGS AORTIC ARCH: Examination technically limited by lack of IV contrast. Visualized aortic arch within normal limits for caliber. Bovine branching pattern with common origin of the left common and brachiocephalic arteries noted. No hemodynamically significant stenosis seen about the origin of the  great vessels. RIGHT CAROTID SYSTEM: Right CCA patent to the bifurcation without stenosis. Atheromatous irregularity at the origin of the right ICA with mild stenosis of approximately 30-40% by NASCET criteria. Right ICA mildly tortuous but patent distally to the skull base without stenosis, dissection, or occlusion. LEFT CAROTID SYSTEM: Left CCA patent from its origin to the bifurcation without stenosis. Atheromatous change at the origin of the left ICA with estimated 40% stenosis by NASCET criteria. Left ICA mildly tortuous but otherwise patent distally to the skull base without stenosis, dissection or occlusion. VERTEBRAL ARTERIES: Both vertebral arteries appear to arise from the subclavian arteries. Origins of the vertebral arteries not well assessed on this examination. Visualized vertebral arteries widely patent with symmetric antegrade flow, with no evidence for dissection or occlusion. IMPRESSION: MRI HEAD IMPRESSION: 1. Patchy small volume acute ischemic infarcts involving the right splenium and right cerebral white matter as above, with additional punctate acute ischemic left cerebellar infarct. No associated hemorrhage or mass effect. 2. Underlying age-related cerebral atrophy with moderate chronic microvascular ischemic disease. MRA HEAD IMPRESSION: 1. Motion degraded exam. 2. Negative MRA for large vessel occlusion. 3. Extensive intracranial atherosclerotic disease with multifocal moderate to severe stenoses involving both the anterior and posterior circulation as detailed above. MRA NECK  IMPRESSION: 1. Approximate 30-40% stenoses about the origins of both internal carotid arteries, left slightly worse than right. No other hemodynamically significant stenosis or other abnormality about either carotid artery system within the neck. 2. Wide patency of both vertebral arteries within the neck. Electronically Signed   By: Jeannine Boga M.D.   On: 05/28/2020 05:48   MR ANGIO NECK WO CONTRAST  Result Date: 05/28/2020 CLINICAL DATA:  Initial evaluation for acute stroke. EXAM: MRI HEAD WITHOUT CONTRAST MRA HEAD WITHOUT CONTRAST MRA NECK WITHOUT CONTRAST TECHNIQUE: Multiplanar, multiecho pulse sequences of the brain and surrounding structures were obtained without intravenous contrast. Angiographic images of the Circle of Willis were obtained using MRA technique without intravenous contrast. Angiographic images of the neck were obtained using MRA technique without intravenous contrast. Carotid stenosis measurements (when applicable) are obtained utilizing NASCET criteria, using the distal internal carotid diameter as the denominator. COMPARISON:  Prior head CT from 05/27/2020. FINDINGS: MRI HEAD FINDINGS Brain: Cerebral volume within normal limits for age. Patchy T2/FLAIR hyperintensity seen within the periventricular and deep white matter of both cerebral hemispheres, most consistent with chronic small vessel ischemic disease, moderate in nature. Few scattered superimposed remote lacunar infarcts noted about the basal ganglia and hemispheric cerebral white matter. 12 mm area of restricted diffusion seen involving the right posterior splenium (series 6, image 76). Few additional punctate acute ischemic infarcts seen within the right cerebral white matter (series 6, images 78, 76). Probable additional small ischemic infarct involving the right hippocampus (series 6, image 71). Punctate left cerebellar infarct noted as well (series 6, image 61). No associated hemorrhage or mass effect. Gray-white  matter differentiation otherwise maintained. No encephalomalacia to suggest chronic cortical infarction elsewhere within the brain. No evidence for acute or chronic intracranial hemorrhage. No mass lesion, midline shift or mass effect. No hydrocephalus or extra-axial fluid collection. Pituitary gland suprasellar region normal. Midline structures intact. Vascular: Major intracranial vascular flow voids are maintained. Skull and upper cervical spine: Craniocervical junction within normal limits. Bone marrow signal intensity normal. No scalp soft tissue abnormality. Sinuses/Orbits: Globes and orbital soft tissues within normal limits. Moderate layering fluid noted within the dominant right sphenoid sinus. Layering fluid noted within the nasopharynx as well. Small bilateral  mastoid effusions. Patient is intubated. Other: None. MRA HEAD FINDINGS ANTERIOR CIRCULATION: Examination somewhat degraded by motion artifact. Visualized distal cervical segments of the internal carotid arteries are both patent with antegrade flow. Petrous segments widely patent. Scattered atheromatous change throughout the carotid siphons with associated moderate to severe multifocal stenoses, most pronounced at the supraclinoid right ICA (series 7, image 94). Origin of the ophthalmic arteries not well seen on this exam. ICA termini perfused. Severe bilateral proximal A1 stenoses noted (series 1044, image 11). Normal anterior communicating artery complex. Moderate atheromatous irregularity throughout the ACAs which remain patent to their distal aspects. No M1 stenosis or occlusion. Grossly normal MCA bifurcations. Severe proximal left M2 stenosis, inferior division (series 1044, image 10). No proximal MCA branch occlusion. Additional moderate to advance atheromatous irregularity throughout the MCA branches bilaterally. POSTERIOR CIRCULATION: Vertebral arteries widely patent as they course into the cranial vault. Right vertebral widely patent to the  takeoff of the right PICA. Right PICA patent. Distally, there is moderate to severe narrowing of the distal right V4 segment (series 7, image 39). On the left, the left PICA takes off early and appears to be grossly patent. Short-segment severe proximal left V4 stenosis noted (series 7, image 13). Left vertebral irregular distally but remains patent to the vertebrobasilar junction. Atheromatous irregularity throughout the basilar artery without high-grade stenosis. Left SCA widely patent. Severe stenosis at the origin of the right SCA (series 1056, image 12). Both PCAs primarily supplied via the basilar. Multifocal moderate to severe bilateral P2 stenoses noted. PCAs remain patent to their distal aspects. No intracranial aneurysm. MRA NECK FINDINGS AORTIC ARCH: Examination technically limited by lack of IV contrast. Visualized aortic arch within normal limits for caliber. Bovine branching pattern with common origin of the left common and brachiocephalic arteries noted. No hemodynamically significant stenosis seen about the origin of the great vessels. RIGHT CAROTID SYSTEM: Right CCA patent to the bifurcation without stenosis. Atheromatous irregularity at the origin of the right ICA with mild stenosis of approximately 30-40% by NASCET criteria. Right ICA mildly tortuous but patent distally to the skull base without stenosis, dissection, or occlusion. LEFT CAROTID SYSTEM: Left CCA patent from its origin to the bifurcation without stenosis. Atheromatous change at the origin of the left ICA with estimated 40% stenosis by NASCET criteria. Left ICA mildly tortuous but otherwise patent distally to the skull base without stenosis, dissection or occlusion. VERTEBRAL ARTERIES: Both vertebral arteries appear to arise from the subclavian arteries. Origins of the vertebral arteries not well assessed on this examination. Visualized vertebral arteries widely patent with symmetric antegrade flow, with no evidence for dissection or  occlusion. IMPRESSION: MRI HEAD IMPRESSION: 1. Patchy small volume acute ischemic infarcts involving the right splenium and right cerebral white matter as above, with additional punctate acute ischemic left cerebellar infarct. No associated hemorrhage or mass effect. 2. Underlying age-related cerebral atrophy with moderate chronic microvascular ischemic disease. MRA HEAD IMPRESSION: 1. Motion degraded exam. 2. Negative MRA for large vessel occlusion. 3. Extensive intracranial atherosclerotic disease with multifocal moderate to severe stenoses involving both the anterior and posterior circulation as detailed above. MRA NECK IMPRESSION: 1. Approximate 30-40% stenoses about the origins of both internal carotid arteries, left slightly worse than right. No other hemodynamically significant stenosis or other abnormality about either carotid artery system within the neck. 2. Wide patency of both vertebral arteries within the neck. Electronically Signed   By: Jeannine Boga M.D.   On: 05/28/2020 05:48   MR BRAIN WO CONTRAST  Result Date: 05/28/2020 CLINICAL DATA:  Initial evaluation for acute stroke. EXAM: MRI HEAD WITHOUT CONTRAST MRA HEAD WITHOUT CONTRAST MRA NECK WITHOUT CONTRAST TECHNIQUE: Multiplanar, multiecho pulse sequences of the brain and surrounding structures were obtained without intravenous contrast. Angiographic images of the Circle of Willis were obtained using MRA technique without intravenous contrast. Angiographic images of the neck were obtained using MRA technique without intravenous contrast. Carotid stenosis measurements (when applicable) are obtained utilizing NASCET criteria, using the distal internal carotid diameter as the denominator. COMPARISON:  Prior head CT from 05/27/2020. FINDINGS: MRI HEAD FINDINGS Brain: Cerebral volume within normal limits for age. Patchy T2/FLAIR hyperintensity seen within the periventricular and deep white matter of both cerebral hemispheres, most consistent  with chronic small vessel ischemic disease, moderate in nature. Few scattered superimposed remote lacunar infarcts noted about the basal ganglia and hemispheric cerebral white matter. 12 mm area of restricted diffusion seen involving the right posterior splenium (series 6, image 76). Few additional punctate acute ischemic infarcts seen within the right cerebral white matter (series 6, images 78, 76). Probable additional small ischemic infarct involving the right hippocampus (series 6, image 71). Punctate left cerebellar infarct noted as well (series 6, image 61). No associated hemorrhage or mass effect. Gray-white matter differentiation otherwise maintained. No encephalomalacia to suggest chronic cortical infarction elsewhere within the brain. No evidence for acute or chronic intracranial hemorrhage. No mass lesion, midline shift or mass effect. No hydrocephalus or extra-axial fluid collection. Pituitary gland suprasellar region normal. Midline structures intact. Vascular: Major intracranial vascular flow voids are maintained. Skull and upper cervical spine: Craniocervical junction within normal limits. Bone marrow signal intensity normal. No scalp soft tissue abnormality. Sinuses/Orbits: Globes and orbital soft tissues within normal limits. Moderate layering fluid noted within the dominant right sphenoid sinus. Layering fluid noted within the nasopharynx as well. Small bilateral mastoid effusions. Patient is intubated. Other: None. MRA HEAD FINDINGS ANTERIOR CIRCULATION: Examination somewhat degraded by motion artifact. Visualized distal cervical segments of the internal carotid arteries are both patent with antegrade flow. Petrous segments widely patent. Scattered atheromatous change throughout the carotid siphons with associated moderate to severe multifocal stenoses, most pronounced at the supraclinoid right ICA (series 7, image 94). Origin of the ophthalmic arteries not well seen on this exam. ICA termini  perfused. Severe bilateral proximal A1 stenoses noted (series 1044, image 11). Normal anterior communicating artery complex. Moderate atheromatous irregularity throughout the ACAs which remain patent to their distal aspects. No M1 stenosis or occlusion. Grossly normal MCA bifurcations. Severe proximal left M2 stenosis, inferior division (series 1044, image 10). No proximal MCA branch occlusion. Additional moderate to advance atheromatous irregularity throughout the MCA branches bilaterally. POSTERIOR CIRCULATION: Vertebral arteries widely patent as they course into the cranial vault. Right vertebral widely patent to the takeoff of the right PICA. Right PICA patent. Distally, there is moderate to severe narrowing of the distal right V4 segment (series 7, image 39). On the left, the left PICA takes off early and appears to be grossly patent. Short-segment severe proximal left V4 stenosis noted (series 7, image 13). Left vertebral irregular distally but remains patent to the vertebrobasilar junction. Atheromatous irregularity throughout the basilar artery without high-grade stenosis. Left SCA widely patent. Severe stenosis at the origin of the right SCA (series 1056, image 12). Both PCAs primarily supplied via the basilar. Multifocal moderate to severe bilateral P2 stenoses noted. PCAs remain patent to their distal aspects. No intracranial aneurysm. MRA NECK FINDINGS AORTIC ARCH: Examination technically limited by lack of  IV contrast. Visualized aortic arch within normal limits for caliber. Bovine branching pattern with common origin of the left common and brachiocephalic arteries noted. No hemodynamically significant stenosis seen about the origin of the great vessels. RIGHT CAROTID SYSTEM: Right CCA patent to the bifurcation without stenosis. Atheromatous irregularity at the origin of the right ICA with mild stenosis of approximately 30-40% by NASCET criteria. Right ICA mildly tortuous but patent distally to the  skull base without stenosis, dissection, or occlusion. LEFT CAROTID SYSTEM: Left CCA patent from its origin to the bifurcation without stenosis. Atheromatous change at the origin of the left ICA with estimated 40% stenosis by NASCET criteria. Left ICA mildly tortuous but otherwise patent distally to the skull base without stenosis, dissection or occlusion. VERTEBRAL ARTERIES: Both vertebral arteries appear to arise from the subclavian arteries. Origins of the vertebral arteries not well assessed on this examination. Visualized vertebral arteries widely patent with symmetric antegrade flow, with no evidence for dissection or occlusion. IMPRESSION: MRI HEAD IMPRESSION: 1. Patchy small volume acute ischemic infarcts involving the right splenium and right cerebral white matter as above, with additional punctate acute ischemic left cerebellar infarct. No associated hemorrhage or mass effect. 2. Underlying age-related cerebral atrophy with moderate chronic microvascular ischemic disease. MRA HEAD IMPRESSION: 1. Motion degraded exam. 2. Negative MRA for large vessel occlusion. 3. Extensive intracranial atherosclerotic disease with multifocal moderate to severe stenoses involving both the anterior and posterior circulation as detailed above. MRA NECK IMPRESSION: 1. Approximate 30-40% stenoses about the origins of both internal carotid arteries, left slightly worse than right. No other hemodynamically significant stenosis or other abnormality about either carotid artery system within the neck. 2. Wide patency of both vertebral arteries within the neck. Electronically Signed   By: Jeannine Boga M.D.   On: 05/28/2020 05:48   Portable Chest xray  Result Date: 05/28/2020 CLINICAL DATA:  Status post endotracheal tube placement EXAM: PORTABLE CHEST 1 VIEW COMPARISON:  05/28/2019 FINDINGS: Cardiac shadow is enlarged but stable. Endotracheal tube, gastric catheter and left jugular central line are noted. The overall  inspiratory effort is again poor with bibasilar atelectatic changes identified. Some perihilar opacities are noted as well likely representing atelectasis. IMPRESSION: No significant change from the prior study. Electronically Signed   By: Inez Catalina M.D.   On: 05/28/2020 06:34   DG CHEST PORT 1 VIEW  Result Date: 05/27/2020 CLINICAL DATA:  Intubation EXAM: PORTABLE CHEST 1 VIEW COMPARISON:  05/24/2020 chest radiograph and prior. FINDINGS: Endotracheal tube is approximately 2.1 cm above the carina. Enteric tube tip and side hole overlie the gastric body. Left IJ CVC tip overlies the superior cavoatrial junction. Left predominant perihilar and bibasilar patchy opacities. No pneumothorax or pleural effusion. Cardiomegaly. Coronary stent. IMPRESSION: ETT tip approximately 2.1 cm above the carina. Appropriately positioned enteric tube. Left IJ CVC tip overlies the superior cavoatrial junction. Perihilar and bibasilar opacities, unchanged.  Cardiomegaly. Electronically Signed   By: Primitivo Gauze M.D.   On: 05/27/2020 16:54   VAS [place] GROIN PSEUDOANEURYSM  Result Date: 05/28/2020  ARTERIAL PSEUDOANEURYSM  Exam: Right groin Indications: Patient complains of groin pain. History: S/p catheterization. Performing Technologist: Abram Sander RVS  Examination Guidelines: A complete evaluation includes B-mode imaging, spectral Doppler, color Doppler, and power Doppler as needed of all accessible portions of each vessel. Bilateral testing is considered an integral part of a complete examination. Limited examinations for reoccurring indications may be performed as noted. +------------+----------+---------+------+----------+ Right DuplexPSV (cm/s)Waveform PlaqueComment(s) +------------+----------+---------+------+----------+ CFA  116    triphasic                 +------------+----------+---------+------+----------+  Summary: No evidence of pseudoaneurysm, AVF or DVT  Diagnosing physician: Ruta Hinds MD Electronically signed by Ruta Hinds MD on 05/28/2020 at 12:13:57 PM.   --------------------------------------------------------------------------------    Final     Cardiac Studies:  Assessment/Plan:  Acute ischemic CVA in the setting of hypotension cannot rule out cardioembolic source in view of paroxysmal A. Fib  Status post hypotensive shock secondary to large anterior abdominal wall hematoma. Acute respiratory failure rule out aspiration pneumonia Marked leukocytosis  Status post acute non-ST elevation MI status post left cardiac catheterization/PTCA stenting to mid LAD Status post paroxysmal atrial fibrillation back in normal sinus rhythm. Acute blood loss anemia secondary to large anterior abdominal wall hematoma Acute kidney injury secondary to hypotension/hypoperfusion improved Hypertension Diabetes mellitus History of TIA/CVA in the past Morbid obesity Obstructive sleep apnea History of gouty arthritis Plan Restart aspirin 81 mg daily We will consider starting Eliquis after 3 months along with Plavix only once abdominal wall hematoma resolved and hemoglobin stable.  Will discuss with neurology.  Continue aspirin and Plavix for now. Reduce amiodarone 100 mg daily. Out of bed as tolerated PT consult Check labs in a.m.  LOS: 5 days    Charolette Forward 05/29/2020, 9:48 AM

## 2020-05-29 NOTE — Progress Notes (Signed)
0800 and 1000 meds given via OG tube in anticipation of extubation. Pt vomited when cuff deflated (likely the majority of meds given). Oral care provided. BiPAP deferred to .  Assisted pt in calling her daughter to update to her extubation.  1000 Daughter at bedside visiting Pt requiring frequent reminders on how to use yaunker, but cough is strong and she is managing secretions well C/o dizziness; d/w CCM

## 2020-05-29 NOTE — Progress Notes (Signed)
   Dr. Terrence Dupont called me to perform PCI on critical LAD lesion on 8/10, after he had performed the diagnostic cath on the same day for Ms. Vanepps.  The 6 Fr sheath was already in place when I arrived to the procedure room.  She was hypertensive and volume overloaded with elevated LVEDP.  I stopped her IV fluids and ordered some IV antihypertensive meds, and some additional sedation.  Her LAD was successfully stented with IVUS guidance, without incident.  Due to ACS, we decided to change her clopidogrel to Brilinta and she was loaded with Brilinta at the time of the PCI.  Angiomax was not continued after the PCI, and was completed before she left the cath lab.    Dr. Terrence Dupont called me on the morning of 8/13 and informed me of a bleeding complication, despite him describing "a clean stick" when he obtained right femoral access on 8/10.  Per his report, the patient had an anterior abdominal wall hematoma.  He asked if I would consider performing a right common femoral angiogram from the arm to look for bleeding.  I declined and suggested that he get an ultrasound to evaluate for pseudoaneurysm, and obtain a vascular surgery consult.    He called me again on the morning of 8/14.  He informed me that the patient became hypotensive and required transfusion.  She was now intubated and diagnosed with an ischemic stroke.  She also had intermittent atrial fibrillation.    In regards to the stroke, we discussed 3 possibilities:  1. Complication from cath/ PCI.  However her stroke was days later in the setting of hypotension. A single guide catheter was used for the PCI.  No additional catheter exchanges were needed for PCI, making stroke from cath less likely.   2. AFib- but he described that she had very short duration of AFib and converted back to sinus. 3. Hypotension- this could cause low flow in a patient who likely has some cerebrovascular disease and thus create ischemia.  This seemed like the most likely  cause.   In terms of management going forward, she had been switched back to Plavix from Brilinta due to bleeding complication. I told Dr. Terrence Dupont I was ok with stopping aspirin and continuing clopidogrel monotherapy due to her bleeding issues.  I do not think clopidogrel can be stopped safely due to her recent stent.  He asked if I could see the patient on Monday, which I can do as a courtesy to Dr. Terrence Dupont and Ms. Colasurdo.   Jettie Booze, MD

## 2020-05-29 NOTE — Progress Notes (Signed)
Called elink  Regarding blood pressures, awaiting orders.

## 2020-05-29 NOTE — Progress Notes (Signed)
STROKE TEAM PROGRESS NOTE     INTERVAL HISTORY Patient was extubated this morning and so far seems to be breathing well.  She has severe dysarthria and unable to speak well.  She follows simple commands well.  She is moving all 4 extremities well with only mild right hand weakness.  Vital signs are stable.  OBJECTIVE Vitals:   05/29/20 0700 05/29/20 0745 05/29/20 0823 05/29/20 0832  BP:      Pulse: (!) 43 (!) 53  (!) 56  Resp: 18 (!) 22  (!) 28  Temp:   98.1 F (36.7 C)   TempSrc:   Oral   SpO2: 100% 100%  100%  Weight:      Height:        CBC:  Recent Labs  Lab 05/28/20 1704 05/29/20 0348  WBC 19.7* 18.2*  HGB 9.7* 10.2*  HCT 28.8* 29.7*  MCV 85.2 85.3  PLT 165 097    Basic Metabolic Panel:  Recent Labs  Lab 05/27/20 1558 05/27/20 1907 05/28/20 0602 05/29/20 0348  NA 140   < > 139 139  K 4.1   < > 4.1 3.9  CL 107   < > 107 109  CO2 22   < > 21* 21*  GLUCOSE 176*   < > 158* 129*  BUN 29*   < > 32* 30*  CREATININE 2.37*   < > 2.01* 1.36*  CALCIUM 8.0*   < > 7.6* 8.4*  MG 2.2  --   --   --    < > = values in this interval not displayed.    Lipid Panel:     Component Value Date/Time   CHOL 198 05/25/2020 0107   TRIG 192 (H) 05/28/2020 0602   HDL 40 (L) 05/25/2020 0107   CHOLHDL 5.0 05/25/2020 0107   VLDL 13 05/25/2020 0107   LDLCALC 145 (H) 05/25/2020 0107   HgbA1c:  Lab Results  Component Value Date   HGBA1C 7.3 (H) 05/24/2020   Urine Drug Screen:     Component Value Date/Time   LABOPIA NONE DETECTED 07/01/2018 0625   COCAINSCRNUR NONE DETECTED 07/01/2018 0625   LABBENZ NONE DETECTED 07/01/2018 0625   AMPHETMU NONE DETECTED 07/01/2018 0625   THCU NONE DETECTED 07/01/2018 0625   LABBARB NONE DETECTED 07/01/2018 0625    Alcohol Level No results found for: Ada  EEG 05/28/2020 ABNORMALITY  Excessive beta, generalized -Continuous slow, generalized  IMPRESSION:  This study is suggestive of severe diffuse encephalopathy, nonspecific  etiology but likely related to sedation. No seizures or epileptiform discharges were seen throughout the recording.   CT ABDOMEN PELVIS WO CONTRAST 05/27/2020 IMPRESSION:  1. Large right groin hematoma relatively similar to the prior CT. Correlation with H/H levels and continued follow-up as clinically indicated.  2. No hydronephrosis or nephrolithiasis.  3. Sigmoid diverticulosis. No bowel obstruction. Normal appendix.  4. Aortic Atherosclerosis (ICD10-I70.0).   CT HEAD WO CONTRAST 05/27/2020 IMPRESSION:  1. No acute intracranial pathology.  2. Mild chronic microvascular ischemic changes.   MR BRAIN WO CONTRAST MR ANGIO HEAD WO CONTRAST MR ANGIO NECK WO CONTRAST 05/28/2020  MRI HEAD  IMPRESSION:  1. Patchy small volume acute ischemic infarcts involving the right splenium and right cerebral white matter as above, with additional punctate acute ischemic left cerebellar infarct. No associated hemorrhage or mass effect.  2. Underlying age-related cerebral atrophy with moderate chronic microvascular ischemic disease.   MRA HEAD  IMPRESSION:  1. Motion degraded exam.  2. Negative  MRA for large vessel occlusion.  3. Extensive intracranial atherosclerotic disease with multifocal moderate to severe stenoses involving both the anterior and posterior circulation as detailed above.   MRA NECK  IMPRESSION:  1. Approximate 30-40% stenoses about the origins of both internal carotid arteries, left slightly worse than right. No other hemodynamically significant stenosis or other abnormality about either carotid artery system within the neck.  2. Wide patency of both vertebral arteries within the neck.   Portable Chest xray 05/28/2020 IMPRESSION:  No significant change from the prior study.  DG CHEST PORT 1 VIEW 05/27/2020 IMPRESSION:  ETT tip approximately 2.1 cm above the carina. Appropriately positioned enteric tube. Left IJ CVC tip overlies the superior cavoatrial junction. Perihilar and  bibasilar opacities, unchanged.  Cardiomegaly.   VAS [place] GROIN PSEUDOANEURYSM 05/27/2020 Summary: No evidence of pseudoaneurysm, AVF or DVT    --------------------------------------------------------------------------------     Preliminary    Transthoracic Echocardiogram  03/28/20 IMPRESSIONS  1. Left ventricular ejection fraction, by estimation, is 60 to 65%. The  left ventricle has normal function. The left ventricle has no regional  wall motion abnormalities. There is moderate left ventricular hypertrophy.  Left ventricular diastolic  parameters are indeterminate.  2. Right ventricular systolic function is normal. The right ventricular  size is normal. There is normal pulmonary artery systolic pressure.  3. The mitral valve is normal in structure. Trivial mitral valve  regurgitation.  4. The aortic valve was not well visualized. Aortic valve regurgitation  is not visualized. Mild aortic valve stenosis. Vmax 2.4 m/s, MG 12 mmHg,  AVA 1.7 cm^2, DI 0.45   ECG - SR rate 64 BPM. (See cardiology reading for complete details)   PHYSICAL EXAM   Blood pressure (!) 184/90, pulse (!) 56, temperature 98.1 F (36.7 C), temperature source Oral, resp. rate (!) 28, height 5\' 4"  (1.626 m), weight 119.3 kg, SpO2 100 %. Obese elderly African-American lad  . Afebrile. Head is nontraumatic. Neck is supple without bruit.    Cardiac exam no murmur or gallop. Lungs are clear to auscultation. Distal pulses are well felt. Neurological Exam :  She is awake and alert.  Her speech is severely dysarthric and difficult to understand.   She follows simple midline and one-step commands.  Extraocular movements appear full range.  She blinks to threat bilaterally.  Fundi not visualized.  Mild right lower facial asymmetry when she smiles.  Tongue midline.  Able to move left upper and lower extremity briskly off the bed.  Mild right hemiparesis with right grip and intrinsic hand muscle weakness..  Sensation appears  intact bilaterally.  Plantars were equivocal.  Gait not tested     ASSESSMENT/PLAN Sherry Fitzpatrick is a 69 y.o. female with history of essential hypertension, stroke with no residual effects, obesity, OSA, hyperlipidemia, and heart murmur admitted to [facility] 05/24/2020 with a diagnosis of non-STEMI -> left cardiac catheterization/PTCA stenting to the mid LAD on 05/24/20.  Neurology consulted 8/13 for AMS. On arrival pt found with intermittent bilateral extensor posturing, right gaze deviation, weak gag and pupils mid and fixed with hypotension - SBP 64.  She did not receive IV t-PA due to instability.  Stroke: Patchy small volume acute ischemic infarcts involving the right splenium and right cerebral white matter, right hippocampus with additional punctate acute ischemic left cerebellar infarct - likely embolic from new atrial fibrillation  Resultant right hemiparesis code Stroke CT Head -      CT head - No acute intracranial pathology. Mild chronic  microvascular ischemic changes.  MRI head - Patchy small volume acute ischemic infarcts involving the right splenium and right cerebral white matter as above, with additional punctate acute ischemic left cerebellar infarct. Underlying age-related cerebral atrophy with moderate chronic microvascular ischemic disease.   MRA head - Extensive intracranial atherosclerotic disease with multifocal moderate to severe stenoses involving both the anterior and posterior circulation.  MRA Neck - Approximate 30-40% stenoses about the origins of both internal carotid arteries, left slightly worse than right  CTA H&N - not ordered  CT Perfusion - not ordered  Carotid Doppler - MRA neck ordered - carotid dopplers not indicated.  EEG - This study is suggestive of severe diffuse encephalopathy, nonspecific etiology but likely related to sedation. No seizures or epileptiform discharges were seen throughout the recording.   2D Echo - 03/28/20 - EF 60 - 65%. No  cardiac source of emboli identified.   Sars Corona Virus 2 - negative  LDL - 145  HgbA1c - 7.3  UDS - not ordered  VTE prophylaxis - SCDs Diet  Diet Order            Diet NPO time specified  Diet effective now                 Aspirin 81 mg and Plavix 75 mg daily prior to admission, now on Aspirin 81 mg and Plavix 75 mg daily  Patient counseled to be compliant with her antithrombotic medications  Ongoing aggressive stroke risk factor management  Therapy recommendations:  pending  Disposition:  Pending  Hypertension  Home BP meds: Norvasc ; Coreg ; Catapres ; Apresoline ; Diovan  Current BP meds: vasopressin  Stable . Permissive hypertension (OK if < 220/120) but gradually normalize in 5-7 days if OK with Cardiology . Long-term BP goal normotensive  Hyperlipidemia  Home Lipid lowering medication: Lipitor 80 mg daily  LDL 145, goal < 70  Current lipid lowering medication: Lipitor 80 mg daily   Continue statin at discharge  Diabetes  Home diabetic meds: none   Current diabetic meds: insulin  HgbA1c 7.3, goal < 7.0 Recent Labs    05/28/20 2350 05/29/20 0332 05/29/20 0821  GLUCAP 142* 123* 133*    Other Stroke Risk Factors  Advanced age  ETOH use, advised to drink no more than 1 alcoholic beverage per day.  Obesity, Body mass index is 45.15 kg/m., recommend weight loss, diet and exercise as appropriate   Hx stroke/TIA  Coronary artery disease  Obstructive sleep apnea,  Not on CPAP at home  Other Active Problems  Code status - Full code  Intubated  CKD - stage 4 - creatinine - 2.37->2.01->1.36  Anemia - Hgb - 8.8->6.8->8.3 - transfused - 10.2  Groin hematoma  Leukocytosis - WBC's 28.3->28.1->19.7->18.2 (afebrile) Cefepime and Vancomycin-> off - now on Rocephin for pneumonia  Aortic Atherosclerosis (ICD10-I70.0).   Non STEMI - LAD stent 8/10  New onset atrial fibrillation -> long-term anticoagulation later when stable    Bradycardia 40's - 50's   Hospital day # 5 Patient seems to be doing well and I expect continuing ongoing improvement with the therapies and likely short stay in rehab.  She is likely going to need dual antiplatelet therapy at least for a few weeks given her cardiac stenting and then switch to single agent antiplatelet along with Eliquis because of her atrial fibrillation if abdominal wall hematoma and hemoglobin stays stable.  Discussed with Dr. Loanne Drilling and answered questions.  Greater than 50% time during the  35-minute visit were spent in counseling and coordination of care about her strokes and need for anticoagulation for stroke prevention and discussion of risk benefits. Antony Contras, MD  To contact Stroke Continuity provider, please refer to [email or web address] After hours, contact General Neurology

## 2020-05-29 NOTE — Procedures (Signed)
Extubation Procedure Note  Patient Details:   Name: Sherry Fitzpatrick DOB: [birth year] MRN: [id number]   Airway Documentation:    Vent end date: 05/29/20 Vent end time: 0832   Evaluation  O2 sats: stable throughout Complications: Complications of Vomiting post cuff deflation, MD aware and stayed at bedside during extubation. Patient did tolerate procedure well. Bilateral Breath Sounds: (S) Stridor (Upper airway, MD aware.)   Yes   Pt extubated per MD order. Cuff leak heard prior to extubation, cuff believed to be between vocal cords causing pt to vomit. MD aware, mild stridor heard in upper airway, pt is able to vocalize and is alert and oriented with a good cough. Pt states she no longer feels nauseated. RT will continue to monitor.  Esperanza Sheets T 05/29/2020, 8:39 AM

## 2020-05-29 NOTE — Progress Notes (Signed)
Switched pt to CPAP/PS, Vt are less the 200 and low RR. Pt place back on full vent support will attempt CPAP/PS later on.

## 2020-05-29 NOTE — Evaluation (Signed)
Clinical/Bedside Swallow Evaluation Patient Details  Name: Sherry Fitzpatrick MRN: [id number] Date of Birth: Jul 18, 1951  Today's Date: 05/29/2020 Time: SLP Start Time (ACUTE ONLY): 7564 SLP Stop Time (ACUTE ONLY): 1203 SLP Time Calculation (min) (ACUTE ONLY): 18 min  Past Medical History:  Past Medical History:  Diagnosis Date  . Alterations of sensations, late effect of cerebrovascular disease(438.6)   . Arthritis    "all over"  . Backache, unspecified   . Body mass index 40.0-44.9, adult ([place])   . Carpal tunnel syndrome   . Dizziness and giddiness   . Esophageal reflux   . Generalized pain   . Headache    "had alot til I had pituitary tumor removed"  . Heart murmur   . Other abnormal glucose   . Other and unspecified hyperlipidemia   . Other malaise and fatigue   . Other postprocedural status(V45.89)   . Other symptoms involving cardiovascular system   . Sleep apnea    "did test; they wanted me to get a CPAP but I never did get it" (04/14/2015) Does use inhaler at night (01 2021)  . Stroke ([place]) 06/2003; 02/2010   "minor; minor" ; denies residual on 04/14/2015  . Unspecified disorder of the pituitary gland and its hypothalamic control   . Unspecified essential hypertension   . Unspecified visual disturbance    Past Surgical History:  Past Surgical History:  Procedure Laterality Date  . CARPAL TUNNEL RELEASE Left ~ 2014  . CORONARY STENT INTERVENTION N/A 05/24/2020   Procedure: CORONARY STENT INTERVENTION;  Surgeon: Jettie Booze, MD;  Location: [facility];  Service: Cardiovascular;  Laterality: N/A;  . JOINT REPLACEMENT    . LEFT HEART CATH AND CORONARY ANGIOGRAPHY N/A 05/24/2020   Procedure: LEFT HEART CATH AND CORONARY ANGIOGRAPHY;  Surgeon: Charolette Forward, MD;  Location: [facility];  Service: Cardiovascular;  Laterality: N/A;  . TOTAL HIP ARTHROPLASTY Right 11/06/2010  . TRANSPHENOIDAL / TRANSNASAL HYPOPHYSECTOMY / RESECTION PITUITARY TUMOR  06/13/2012  .  TUBAL LIGATION  [id number]   HPI:  69 year old female with a history of stroke with no residual deficits who presented on 8/10 with NSTEMI and underwent PTCA with stenting to mid-LAD. While in the hospital today neurology was consulted for altered mental status. Exam revealed intermittent bilateral extensor posturing, right gaze deviation, weak gag and pupils mid and fixed.  MRI revealed multiple ischemic infarcts without hemorrhage.  Pt was intubated from 8/13-8/15.     Assessment / Plan / Recommendation Clinical Impression  Pt was seen for a bedside swallow evaluation in the setting of recent extubation.  Pt was encountered awake, but lethargic with her daughter at bedside.  Pt was noted to have decreased vocal intensity with a hoarse vocal quality upon arrival.  Pt was also suspected to have decreased secretion management, as daughter was providing oral suction upon SLP arrival. Oral mechanism examination was remarkable for generalized oral motor weakness including decreased lingual strength and reduced labial, facial, and lingual ROM.  Pt has adequate natural bottom dentition, but she is edentulous on the top.  She has top dentures, but they were not present at this time.  Pt/daughter are unsure as to if they are at the pt's home or if they are in the hospital. Pt was agreeable to minimal trials of ice chips and thin liquid.  She exhibited difficulty with labial closure around the spoon and she was noted to have decreased lingual manipulation of all trials.  She passively  accepted a small ice chip into her oral cavity, and she allowed it to sit on the central portion of her lingual surface without attempting lingual manipulation.  Ice chip was removed secondary to concerns for aspiration.  1/4 tsp of thin liquid was presented via spoon and pt was observed to have mildly improved lingual manipulation with some oral holding and suspected delayed swallow initiation.  She was able to achieve labial closure around  the straw and she independently drew liquid from the straw.  Oral holding with suspected prolonged AP transport and swallow initiation was again observed.  No overt s/sx of aspiration were observed with liquid trials; however, swallows appeared to be effortful, and pt required verbal cues to achieve swallow initiation with all trials.  Given decreased secretion management, decreased bolus control with trials, and need for cuing to achieve swallow initiation, pt is at an increased risk for aspiration with all PO at this time.  Therefore, recommend that pt remain NPO at this time with frequent oral care.  Pt may have a few small sips of water (not to exceed 5 in a day) given full RN supervision and thorough oral care.  Recommend that medication be administered via alternative means.  Hopeful that pt's tolerance of PO will improve in the next 24-48 hours.  SLP will plan to f/u for PO trials tomorrow.   SLP Visit Diagnosis: Dysphagia, unspecified (R13.10)    Aspiration Risk  Moderate aspiration risk    Diet Recommendation NPO (small sips of water (not to exceed 5 in a day) )   Liquid Administration via: Spoon;Straw Medication Administration: Via alternative means    Other  Recommendations Oral Care Recommendations: Oral care QID;Oral care prior to ice chip/H20 Other Recommendations: Remove water pitcher;Have oral suction available   Follow up Recommendations Skilled Nursing facility      Frequency and Duration min 2x/week  2 weeks       Prognosis Prognosis for Safe Diet Advancement: Good      Swallow Study   General HPI: 69 year old female with a history of stroke with no residual deficits who presented on 8/10 with NSTEMI and underwent PTCA with stenting to mid-LAD. While in the hospital today neurology was consulted for altered mental status. Exam revealed intermittent bilateral extensor posturing, right gaze deviation, weak gag and pupils mid and fixed.  MRI revealed multiple ischemic  infarcts wihtout hemorrhage.  Pt was intubated from 8/13-8/15.   Type of Study: Bedside Swallow Evaluation Previous Swallow Assessment: None  Diet Prior to this Study: NPO Temperature Spikes Noted: Yes Respiratory Status: Nasal cannula History of Recent Intubation: Yes Length of Intubations (days): 2 days Date extubated: 05/29/20 Behavior/Cognition: Cooperative;Lethargic/Drowsy Oral Cavity Assessment: Within Functional Limits Oral Care Completed by SLP: No Oral Cavity - Dentition: Missing dentition (edentolous top (dentures at home); natural bottom dentition) Self-Feeding Abilities: Total assist Patient Positioning: Upright in bed Baseline Vocal Quality: Hoarse;Low vocal intensity Volitional Swallow: Unable to elicit    Oral/Motor/Sensory Function Overall Oral Motor/Sensory Function: Generalized oral weakness Facial ROM: Reduced right;Reduced left Facial Symmetry: Within Functional Limits Lingual ROM: Reduced right;Reduced left Lingual Symmetry: Within Functional Limits Lingual Strength: Reduced   Ice Chips Ice chips: Impaired Presentation: Spoon Oral Phase Impairments: Reduced lingual movement/coordination Oral Phase Functional Implications: Oral holding   Thin Liquid Thin Liquid: Impaired Presentation: Spoon;Straw Oral Phase Impairments: Reduced lingual movement/coordination Oral Phase Functional Implications: Prolonged oral transit Pharyngeal  Phase Impairments: Suspected delayed Swallow    Nectar Thick Nectar Thick Liquid:  Not tested   Honey Thick Honey Thick Liquid: Not tested   Puree Puree: Not tested   Solid     Solid: Not tested     Colin Mulders M.S., CCC-SLP Acute Rehabilitation Services Office: [phone]  [place] 05/29/2020,12:23 PM

## 2020-05-29 NOTE — Progress Notes (Signed)
NAME:  Sherry Fitzpatrick, MRN:  [id number], DOB:  October 02, 1951, LOS: 5 ADMISSION DATE:  05/24/2020, CONSULTATION DATE:  05/27/2020 REFERRING MD:  Terrence Dupont, MD CHIEF COMPLAINT:  Altered mental status   Brief History   69 year old female with PMHx of COPD, HTN, DMII, OSA, TIA/CVA, HLD, HFpEF who initially presented on 8/10 with chest pain radiating to arm with associated dyspnea and headache. She was admitted for NSTEMI and underwent LHC and coronary angiography with stenting to mid LAD on 8/10.  Patient noted to have altered mental status on 8/13. Acutely, had hypotension for which started on pressors. She was intubated for airway protection in setting of presumed stroke. Transferred to [facility].  History of present illness   Sherry Fitzpatrick is a 69 year old female with past medical history of hypertension, diabetes mellitus, obstructive sleep apnea, TIA/CVA, diastolic congestive heart failure, morbid obesity, and COPD who initially presented on 8/10 with retrosternal chest pain radiating to the arm that woke her from sleep associated with dyspnea and headache. She was given aspirin and sublingual nitro by EMS and brought to the ED where EKG showed non NSR with ST-T wave changes in inferolateral leads with elevated hsTroponin 682>780. Patient was started on IV heparin and nitro with improvement in symptoms and underwent left heart catherization with PCI stenting to mid LAD with Dr. Terrence Dupont.   On morning of 8/13, patient with episode of emesis and severe lower abdominal pain and right groin pain relieved with tramadol. Later in the day, had episode of acute altered mental status with seizure activity and hypotension. She was noted to have right lateral gaze deviation for which code stroke activated. She was started on dopamine for hypotension. Also due to concerns of inability to protect her airways, patient intubated. Patient transferred to CCU and care assumed by [facility].  Past Medical History   Past Medical History:    Diagnosis Date  . Alterations of sensations, late effect of cerebrovascular disease(438.6)   . Arthritis    "all over"  . Backache, unspecified   . Body mass index 40.0-44.9, adult ([place])   . Carpal tunnel syndrome   . Dizziness and giddiness   . Esophageal reflux   . Generalized pain   . Headache    "had alot til I had pituitary tumor removed"  . Heart murmur   . Other abnormal glucose   . Other and unspecified hyperlipidemia   . Other malaise and fatigue   . Other postprocedural status(V45.89)   . Other symptoms involving cardiovascular system   . Sleep apnea    "did test; they wanted me to get a CPAP but I never did get it" (04/14/2015) Does use inhaler at night (01 2021)  . Stroke ([place]) 06/2003; 02/2010   "minor; minor" ; denies residual on 04/14/2015  . Unspecified disorder of the pituitary gland and its hypothalamic control   . Unspecified essential hypertension   . Unspecified visual disturbance    Significant Hospital Events   8/10 > admission 8/10 > LHC with PCI stent to mid-LAD  8/13 > acute AMS with seizure activity; intubated for airway protection and transferred to [facility] 8/14 MRI with stroke. Hypotensive overnight requiring pressors. Weaned off in am. Consults:  Cardiology Neurology  Procedures:  8/10 > LHC and PCI stenting to mid LAD 8/13 > ETT  Significant Diagnostic Tests:  CXR 8/10 > stable cardiomegaly; patchy interstitial and airspace opacities bilaterally - edema vs infection CT Abd/Pelvis wo Contrast 8/11 > large hematoma (18.9x7x5.6cm)  from R common femoral vascular bundle coursing within the anterior abdominal wall superolaterally between external and internal oblique muscles; no extension into peritoneum or below transversus abdominus musculature; mild ground glass pulmonary infiltrate within visualized lung bases bilaterally US Groin 8/13> No pseudoaneurysm CT A/P> Bibasilar atelectasis, right groin hematoma extending into right lower anterior pelvic  wall, similar to prior MR Angio Head and Neck wo contrast 8/14 >  Neg for large vessel occlusion. 30-40% ICA stenosis, patent vertebral arteries MRI Brain 8/14 > Patchy acute ischemic infarcts involving right splenium and right cerebral white matter with punctate ischemic left cerebellar infarct. EEG 8/14> Severe diffuse encephalopathy. No seizure activity Micro Data:  SARS CoV2 8/10 > Negative MRSA PCR 8/10 > Negative   Antimicrobials:  None    Interim history/subjective:  Tolerated PS >2 hours yesterday. On PS 15/5 with good TV  Objective   Blood pressure (!) 184/90, pulse (!) 53, temperature 98.1 F (36.7 C), temperature source Oral, resp. rate (!) 22, height 5\' 4"  (1.626 m), weight 119.3 kg, SpO2 100 %.    Vent Mode: PSV;CPAP FiO2 (%):  [40 %] 40 % Set Rate:  [18 bmp] 18 bmp Vt Set:  [430 mL] 430 mL PEEP:  [5 cmH20] 5 cmH20 Pressure Support:  [12 cmH20-15 cmH20] 15 cmH20 Plateau Pressure:  [24 cmH20-25 cmH20] 24 cmH20   Intake/Output Summary (Last 24 hours) at 05/29/2020 0828 Last data filed at 05/29/2020 0700 Gross per 24 hour  Intake 1885.44 ml  Output 600 ml  Net 1285.44 ml   Filed Weights   05/26/20 0510 05/27/20 0320 05/29/20 0400  Weight: 115.2 kg 116.3 kg 119.3 kg   Physical Exam: General: Obese female on mechanical ventilation HENT: [place], AT, ETT in place Eyes: EOMI, PERRL, no scleral icterus Respiratory: Clear to auscultation bilaterally.  No crackles, wheezing or rales Cardiovascular: Irregularly irregular rate and rhythm, -M/R/G, no JVD GI: BS+, soft, nontender Extremities:-Edema,-tenderness Neuro: Opens eyes to voice, moves extremities x 4 spontaneously, does not follow commands  Physical Exam: General: Obese female on mechanical ventilation HENT: [place], AT, ETT in place Eyes: EOMI, no scleral icterus Respiratory: Diminished breath sounds bilaterally.  No crackles, wheezing or rales Cardiovascular: RRR, -M/R/G, no JVD GI: BS+, soft,  nontender Extremities:-Edema,-tenderness Neuro: Awake and alert, follows commands, moves extremities x 4 with equal strength bilaterally  Resolved Hospital Problem list    Assessment & Plan:  Acute ischemic CVA: Confirmed on MRI 8/14 Seizure activity secondary to CVA: EEG neg for sz activity - Stroke following. Work-up per consult team - Permissive HTN <220/120 but will need to normalize within the week - ASA, Plavix, statin  Acute hypoxic respiratory failure secondary compromised airway in setting of acute encephalopathy  Hx of COPD for which on symbicort, albuterol and duonebs at home. No evidence of hypercarbia - Extubate now - BiPAP PRN and QHS - Lasix 20 mg once  Hemorrhagic shock secondary to anterior abdominal wall hematoma, cannot rule out sepsis from pulmonary source however less likely - resolved - Off vasopressors - De-escalate antibiotics to ceftriaxone. Recommend five days total. F/u final cultures - Transfuse for goal Hg >7  Large anterior abdominal wall hematoma  Acute blood loss anemia - stable Noted to have large anterior abdominal wall hematoma on CT Abdomen on 8/11 following LHC. Repeat CT A/P with large right groin hematoma stable in size - Trend CBC  Recent NSTEMI s/p PTCA stenting to mid LAD New onset Atrial fibrillation - currently in NSR Admitted with NSTEMI requiring stenting  to mid LAD. On DAPT following this with plavix and ASA - Continue amiodarone PO  - Continue ASA and Plavix - K >4, Mag >2 - Cardiology following, appreciate recommendations  Acute on chronic renal failure: Improving Cr, UOP fair Appears to have baseline sCr of 1.15 on admission. Improving today - F/u BMP - Avoid nephrotoxic agents as able  - Lasix 20 mg once as above  Diabetes mellitus  HbA1c 7.3 on admission. Currently on SSI with CBGs 140-230s.  - Will add on Novolog 4U q4h for goal CBG <180.   OSA CPAP nightly  Will monitor respiratory status today. Pulmonary will be  available as needed. Will sign off.  Best practice:  Diet: tube feeds Pain/Anxiety/Delirium protocol (if indicated): Precedex VAP protocol (if indicated): per protocol DVT prophylaxis: SCDs GI prophylaxis: PPI Glucose control: Novolog + SSI Mobility: bed rest Code Status: FULL Family Communication: Attempted to call daughter. Unable to leave voicemail 8/15 Disposition: Per primary  Labs   CBC: Recent Labs  Lab 05/27/20 2324 05/27/20 2324 05/28/20 0052 05/28/20 0602 05/28/20 1322 05/28/20 1704 05/29/20 0348  WBC 28.3*  --   --  28.1* 21.8* 19.7* 18.2*  HGB 8.8*   < > 6.8* 8.3* 9.5* 9.7* 10.2*  HCT 26.9*   < > 20.0* 24.1* 28.1* 28.8* 29.7*  MCV 87.1  --   --  87.0 86.7 85.2 85.3  PLT 182  --   --  196 178 165 182   < > = values in this interval not displayed.    Basic Metabolic Panel: Recent Labs  Lab 05/26/20 0351 05/26/20 0351 05/27/20 0343 05/27/20 0343 05/27/20 1558 05/27/20 1907 05/28/20 0052 05/28/20 0602 05/29/20 0348  NA 139   < > 140   < > 140 141 144 139 139  K 3.4*   < > 3.9   < > 4.1 4.4 3.5 4.1 3.9  CL 102  --  104  --  107  --   --  107 109  CO2 27  --  26  --  22  --   --  21* 21*  GLUCOSE 142*  --  145*  --  176*  --   --  158* 129*  BUN 24*  --  24*  --  29*  --   --  32* 30*  CREATININE 1.36*  --  1.37*  --  2.37*  --   --  2.01* 1.36*  CALCIUM 8.3*  --  8.4*  --  8.0*  --   --  7.6* 8.4*  MG  --   --   --   --  2.2  --   --   --   --    < > = values in this interval not displayed.   GFR: Estimated Creatinine Clearance: 48.9 mL/min (A) (by C-G formula based on SCr of 1.36 mg/dL (H)). Recent Labs  Lab 05/28/20 0602 05/28/20 1322 05/28/20 1704 05/29/20 0348  WBC 28.1* 21.8* 19.7* 18.2*    Liver Function Tests: Recent Labs  Lab 05/27/20 1558 05/28/20 0602  AST 40 54*  ALT 23 38  ALKPHOS 51 43  BILITOT 0.4 0.9  PROT 5.7* 5.1*  ALBUMIN 2.6* 2.4*   No results for input(s): LIPASE, AMYLASE in the last 168 hours. No results for  input(s): AMMONIA in the last 168 hours.  ABG    Component Value Date/Time   PHART 7.351 05/28/2020 0052   PCO2ART 36.3 05/28/2020 0052   PO2ART 193 (H)  05/28/2020 0052   HCO3 20.1 05/28/2020 0052   TCO2 21 (L) 05/28/2020 0052   ACIDBASEDEF 5.0 (H) 05/28/2020 0052   O2SAT 100.0 05/28/2020 0052     Coagulation Profile: Recent Labs  Lab 05/27/20 1544  INR 1.3*    Cardiac Enzymes: No results for input(s): CKTOTAL, CKMB, CKMBINDEX, TROPONINI in the last 168 hours.  HbA1C: Hgb A1c MFr Bld  Date/Time Value Ref Range Status  05/24/2020 05:03 PM 7.3 (H) 4.8 - 5.6 % Final    Comment:    (NOTE) Pre diabetes:          5.7%-6.4%  Diabetes:              >6.4%  Glycemic control for   <7.0% adults with diabetes   03/27/2020 05:55 AM 7.1 (H) 4.8 - 5.6 % Final    Comment:    (NOTE) Pre diabetes:          5.7%-6.4%  Diabetes:              >6.4%  Glycemic control for   <7.0% adults with diabetes     CBG: Recent Labs  Lab 05/28/20 1152 05/28/20 1609 05/28/20 1930 05/28/20 2350 05/29/20 0332  GLUCAP 132* 143* 156* 142* 123*    Critical care time: 40 minutes    The patient is critically ill with multiple organ systems failure and requires high complexity decision making for assessment and support, frequent evaluation and titration of therapies, application of advanced monitoring technologies and extensive interpretation of multiple databases.  Independent Critical Care Time: 40 Minutes.   Rodman Pickle, M.D. [facility] Pulmonary/Critical Care Medicine 05/29/2020 8:28 AM   Please see Amion for pager number to reach on-call Pulmonary and Critical Care Team.

## 2020-05-30 ENCOUNTER — Inpatient Hospital Stay (HOSPITAL_COMMUNITY): Payer: Medicare HMO

## 2020-05-30 DIAGNOSIS — I6330 Cerebral infarction due to thrombosis of unspecified cerebral artery: Secondary | ICD-10-CM | POA: Diagnosis not present

## 2020-05-30 LAB — CBC
HCT: 28.4 % — ABNORMAL LOW (ref 36.0–46.0)
Hemoglobin: 9 g/dL — ABNORMAL LOW (ref 12.0–15.0)
MCH: 28.6 pg (ref 26.0–34.0)
MCHC: 31.7 g/dL (ref 30.0–36.0)
MCV: 90.2 fL (ref 80.0–100.0)
Platelets: 208 10*3/uL (ref 150–400)
RBC: 3.15 MIL/uL — ABNORMAL LOW (ref 3.87–5.11)
RDW: 16.9 % — ABNORMAL HIGH (ref 11.5–15.5)
WBC: 15.4 10*3/uL — ABNORMAL HIGH (ref 4.0–10.5)
nRBC: 0.5 % — ABNORMAL HIGH (ref 0.0–0.2)

## 2020-05-30 LAB — BPAM RBC
Blood Product Expiration Date: 202108162359
Blood Product Expiration Date: 202108172359
Blood Product Expiration Date: 202108202359
Blood Product Expiration Date: 202108202359
Blood Product Expiration Date: 202108232359
Blood Product Expiration Date: 202109062359
Blood Product Expiration Date: 202109062359
Blood Product Expiration Date: 202109062359
ISSUE DATE / TIME: 202108131628
ISSUE DATE / TIME: 202108131730
ISSUE DATE / TIME: 202108140638
ISSUE DATE / TIME: 202108141031
Unit Type and Rh: 600
Unit Type and Rh: 600
Unit Type and Rh: 600
Unit Type and Rh: 600
Unit Type and Rh: 600
Unit Type and Rh: 600
Unit Type and Rh: 9500
Unit Type and Rh: 9500

## 2020-05-30 LAB — BASIC METABOLIC PANEL
Anion gap: 10 (ref 5–15)
BUN: 28 mg/dL — ABNORMAL HIGH (ref 8–23)
CO2: 25 mmol/L (ref 22–32)
Calcium: 8.3 mg/dL — ABNORMAL LOW (ref 8.9–10.3)
Chloride: 108 mmol/L (ref 98–111)
Creatinine, Ser: 1.28 mg/dL — ABNORMAL HIGH (ref 0.44–1.00)
GFR calc Af Amer: 49 mL/min — ABNORMAL LOW (ref >60)
GFR calc non Af Amer: 42 mL/min — ABNORMAL LOW (ref >60)
Glucose, Bld: 129 mg/dL — ABNORMAL HIGH (ref 70–99)
Potassium: 3.7 mmol/L (ref 3.5–5.1)
Sodium: 143 mmol/L (ref 135–145)

## 2020-05-30 LAB — TYPE AND SCREEN
ABO/RH(D): A NEG
Antibody Screen: NEGATIVE
Unit division: 0
Unit division: 0
Unit division: 0
Unit division: 0
Unit division: 0
Unit division: 0
Unit division: 0
Unit division: 0

## 2020-05-30 LAB — BLOOD PRODUCT ORDER (VERBAL) VERIFICATION

## 2020-05-30 LAB — GLUCOSE, CAPILLARY
Glucose-Capillary: 110 mg/dL — ABNORMAL HIGH (ref 70–99)
Glucose-Capillary: 125 mg/dL — ABNORMAL HIGH (ref 70–99)
Glucose-Capillary: 128 mg/dL — ABNORMAL HIGH (ref 70–99)
Glucose-Capillary: 78 mg/dL (ref 70–99)
Glucose-Capillary: 133 mg/dL — ABNORMAL HIGH (ref 70–99)

## 2020-05-30 NOTE — Progress Notes (Signed)
Pt has refused cpap and states she will not wear it again.  Rt removed device and order.  Rt will monitor

## 2020-05-30 NOTE — Progress Notes (Signed)
NAME:  Sherry Fitzpatrick, MRN:  [id number], DOB:  1951/08/08, LOS: 17 ADMISSION DATE:  05/24/2020, CONSULTATION DATE:  05/27/2020 REFERRING MD:  Sherry Dupont, MD CHIEF COMPLAINT:  Altered mental status   Brief History   69 year old female with PMHx of COPD, HTN, DMII, OSA, TIA/CVA, HLD, HFpEF who initially presented on 8/10 with chest pain radiating to arm with associated dyspnea and headache. She was admitted for NSTEMI and underwent LHC and coronary angiography with stenting to mid LAD on 8/10.  Patient noted to have altered mental status on 8/13. Acutely, had hypotension for which started on pressors. She was intubated for airway protection in setting of presumed stroke. Transferred to [facility].  History of present illness   Sherry Fitzpatrick is a 69 year old female with past medical history of hypertension, diabetes mellitus, obstructive sleep apnea, TIA/CVA, diastolic congestive heart failure, morbid obesity, and COPD who initially presented on 8/10 with retrosternal chest pain radiating to the arm that woke her from sleep associated with dyspnea and headache. She was given aspirin and sublingual nitro by EMS and brought to the ED where EKG showed non NSR with ST-T wave changes in inferolateral leads with elevated hsTroponin 682>780. Patient was started on IV heparin and nitro with improvement in symptoms and underwent left heart catherization with PCI stenting to mid LAD with Dr. Terrence Fitzpatrick.   On morning of 8/13, patient with episode of emesis and severe lower abdominal pain and right groin pain relieved with tramadol. Later in the day, had episode of acute altered mental status with seizure activity and hypotension. She was noted to have right lateral gaze deviation for which code stroke activated. She was started on dopamine for hypotension. Also due to concerns of inability to protect her airways, patient intubated. Patient transferred to CCU and care assumed by [facility].  Past Medical History   Past Medical History:    Diagnosis Date  . Alterations of sensations, late effect of cerebrovascular disease(438.6)   . Arthritis    "all over"  . Backache, unspecified   . Body mass index 40.0-44.9, adult ([place])   . Carpal tunnel syndrome   . Dizziness and giddiness   . Esophageal reflux   . Generalized pain   . Headache    "had alot til I had pituitary tumor removed"  . Heart murmur   . Other abnormal glucose   . Other and unspecified hyperlipidemia   . Other malaise and fatigue   . Other postprocedural status(V45.89)   . Other symptoms involving cardiovascular system   . Sleep apnea    "did test; they wanted me to get a CPAP but I never did get it" (04/14/2015) Does use inhaler at night (01 2021)  . Stroke ([place]) 06/2003; 02/2010   "minor; minor" ; denies residual on 04/14/2015  . Unspecified disorder of the pituitary gland and its hypothalamic control   . Unspecified essential hypertension   . Unspecified visual disturbance    Significant Hospital Events   8/10 > admission 8/10 > LHC with PCI stent to mid-LAD  8/13 > acute AMS with seizure activity; intubated for airway protection and transferred to [facility] 8/14 MRI with stroke. Hypotensive overnight requiring pressors. Weaned off in am. 8/15 extubated  Consults:  Cardiology Neurology  Procedures:  8/10 > LHC and PCI stenting to mid LAD 8/13 > ETT  Significant Diagnostic Tests:  CXR 8/10 > stable cardiomegaly; patchy interstitial and airspace opacities bilaterally - edema vs infection CT Abd/Pelvis wo Contrast 8/11 >  large hematoma (18.9x7x5.6cm) from R common femoral vascular bundle coursing within the anterior abdominal wall superolaterally between external and internal oblique muscles; no extension into peritoneum or below transversus abdominus musculature; mild ground glass pulmonary infiltrate within visualized lung bases bilaterally US Groin 8/13> No pseudoaneurysm CT A/P> Bibasilar atelectasis, right groin hematoma extending into right lower  anterior pelvic wall, similar to prior MR Angio Head and Neck wo contrast 8/14 >  Neg for large vessel occlusion. 30-40% ICA stenosis, patent vertebral arteries MRI Brain 8/14 > Patchy acute ischemic infarcts involving right splenium and right cerebral white matter with punctate ischemic left cerebellar infarct. EEG 8/14> Severe diffuse encephalopathy. No seizure activity Micro Data:  SARS CoV2 8/10 > Negative MRSA PCR 8/10 > Negative   Antimicrobials:  None    Interim history/subjective:  Up in chair no distress  Objective   Blood pressure (Abnormal) 167/76, pulse (Abnormal) 59, temperature 97.8 F (36.6 C), temperature source Oral, resp. rate (Abnormal) 25, height 5\' 4"  (1.626 m), weight 116.9 kg, SpO2 93 %.        Intake/Output Summary (Last 24 hours) at 05/30/2020 1025 Last data filed at 05/30/2020 0600 Gross per 24 hour  Intake no documentation  Output 1500 ml  Net -1500 ml   Filed Weights   05/27/20 0320 05/29/20 0400 05/30/20 0300  Weight: 116.3 kg 119.3 kg 116.9 kg   Physical Exam:  General Pleasant 69 year old black female sitting up in no acute distress this morning HEENT no cephalic atraumatic no jugular venous distention Pulmonary: Clear to auscultation diminished bases currently room air Cardiac: Irregularly irregular atrial fibrillation on telemetry Abdomen: Soft nontender no organomegaly Strengths: Warm dry brisk cap refill Neuro: Awake oriented no focal deficit. GU: Clear yellow  Resolved Hospital Problem list    Acute hypoxic respiratory failure secondary compromised airway in setting of acute encephalopathy  Assessment & Plan:  Acute ischemic CVA: Confirmed on MRI 4/48-->JEHU embolic 2/2 afib Seizure activity secondary to CVA: EEG neg for sz activity Plan Cont asa, plavix and statin Gradually normalize BP over next 5-6d.  PT/OT  Hx of COPD Plan Cont BD/ICS  Resolved Hemorrhagic shock secondary to anterior abdominal wall hematoma (s/p left heart  cath) cannot rule out sepsis from pulmonary source however less likely - resolved shock. WBC ct trending down. Hgb stable Plan Day 4/5 abx (narrowed to ctx)  Recent NSTEMI s/p PTCA stenting to mid LAD New onset Atrial fibrillation - currently in NSR Admitted with NSTEMI requiring stenting to mid LAD. On DAPT following this with plavix and ASA Plan Cont amiodarone asa and plavix  Will need to decide on risk benefit of systemic AC when we feel certain hematoma resolved.   Acute on chronic renal failure: Improving Cr, UOP fair Appears to have baseline sCr of 1.15 on admission. Improving today Plan Trend chemistries Avoid nephrotoxins   Diabetes mellitus  HbA1c 7.3 on admission. Currently on SSI with CBGs 140-230s.  Plan ssi and scheduled aspart   OSA Plan CPAP at HS    Best practice:  Diet: tube feeds Pain/Anxiety/Delirium protocol (if indicated): Precedex VAP protocol (if indicated): per protocol DVT prophylaxis: SCDs GI prophylaxis: PPI Glucose control: Novolog + SSI Mobility: bed rest Code Status: FULL Family Communication: Attempted to call daughter. Unable to leave voicemail 8/15 Disposition: Per primary  Critical care will sign off call if needed she is stable for transfer out of ICU from our standpoint  Erick Colace ACNP-BC [place] Pager # [phone] OR # [phone]  if no answer

## 2020-05-30 NOTE — Consult Note (Signed)
Physical Medicine and Rehabilitation Consult Reason for Consult: Altered mental status Referring Physician: Dr. Terrence Dupont   HPI: Sherry Fitzpatrick is a 69 y.o. right-handed female with history of hypertension, systolic congestive heart failure, CVA without residual, diabetes mellitus, hyperlipidemia, morbid obesity with BMI 44.24, CAD with stenting maintained on aspirin and Plavix.  Per chart review patient lives with her children.  Reportedly independent prior to admission.  1 level home 2 steps to entry.  Daughter does most of the housework and heavy meal prep.  Presented 05/24/2020 with chest tightness/dyspnea radiating to the left shoulder and very little relief with nitroglycerin.  EKG normal sinus rhythm with ST-T wave changes in inferior lateral leads noted to have minimally elevated high-sensitivity troponin.  Started on IV heparin and nitro.  Underwent cardiac catheterization with stenting per cardiology services.  Hospital course complicated by hypotension.  On the morning of 05/27/2020 episode of emesis severe lower abdominal pain and right groin pain relieved with tramadol.  Later in the day episode of acute altered mental status/dysarthria question seizure with hypotension requiring dopamine.  MRI showed patchy small volume acute ischemic infarct involving the right splenium and right cerebral white matter with additional punctate acute ischemic left cerebellar infarct.  No associated hemorrhage.  MRA negative of the head.  MRA of the neck approximate 30 to 40% stenosis about the origins of both internal carotid arteries left slightly worse than right.  EEG negative for seizure.  Vascular ultrasound of groin showed no evidence of pseudoaneurysm or DVT.  Patient currently remains on aspirin and Plavix therapy for CVA prophylaxis.  She was started on Rocephin for CAP.  Currently on a dysphagia #3 diet.  Therapy evaluations completed with recommendations of physical medicine rehab  consult.   Review of Systems  Constitutional: Negative for fever.  HENT: Negative for hearing loss.   Eyes: Negative for blurred vision and double vision.  Respiratory: Positive for shortness of breath. Negative for cough.   Cardiovascular: Positive for chest pain, palpitations and leg swelling.  Gastrointestinal: Positive for abdominal pain and nausea.  Musculoskeletal: Negative for joint pain and myalgias.  Skin: Negative for rash.  Neurological: Positive for dizziness, speech change, weakness and headaches.  All other systems reviewed and are negative.  Past Medical History:  Diagnosis Date  . Alterations of sensations, late effect of cerebrovascular disease(438.6)   . Arthritis    "all over"  . Backache, unspecified   . Body mass index 40.0-44.9, adult ([place])   . Carpal tunnel syndrome   . Dizziness and giddiness   . Esophageal reflux   . Generalized pain   . Headache    "had alot til I had pituitary tumor removed"  . Heart murmur   . Other abnormal glucose   . Other and unspecified hyperlipidemia   . Other malaise and fatigue   . Other postprocedural status(V45.89)   . Other symptoms involving cardiovascular system   . Sleep apnea    "did test; they wanted me to get a CPAP but I never did get it" (04/14/2015) Does use inhaler at night (01 2021)  . Stroke ([place]) 06/2003; 02/2010   "minor; minor" ; denies residual on 04/14/2015  . Unspecified disorder of the pituitary gland and its hypothalamic control   . Unspecified essential hypertension   . Unspecified visual disturbance    Past Surgical History:  Procedure Laterality Date  . CARPAL TUNNEL RELEASE Left ~ 2014  . CORONARY STENT INTERVENTION N/A 05/24/2020  Procedure: CORONARY STENT INTERVENTION;  Surgeon: Jettie Booze, MD;  Location: [facility];  Service: Cardiovascular;  Laterality: N/A;  . JOINT REPLACEMENT    . LEFT HEART CATH AND CORONARY ANGIOGRAPHY N/A 05/24/2020   Procedure: LEFT HEART CATH AND  CORONARY ANGIOGRAPHY;  Surgeon: Charolette Forward, MD;  Location: [facility];  Service: Cardiovascular;  Laterality: N/A;  . TOTAL HIP ARTHROPLASTY Right 11/06/2010  . TRANSPHENOIDAL / TRANSNASAL HYPOPHYSECTOMY / RESECTION PITUITARY TUMOR  06/13/2012  . TUBAL LIGATION  1974   Family History  Problem Relation Age of Onset  . Heart failure Mother   . Hypertension Mother   . Diabetes Father   . Hypertension Sister   . Heart failure Sister   . Hypertension Daughter    Social History:  reports that she has never smoked. She has never used smokeless tobacco. She reports current alcohol use. She reports that she does not use drugs. Allergies:  Allergies  Allergen Reactions  . Morphine And Related Hives and Nausea And Vomiting  . Penicillins Hives    Has patient had a PCN reaction causing immediate rash, facial/tongue/throat swelling, SOB or lightheadedness with hypotension: Yes Has patient had a PCN reaction causing severe rash involving mucus membranes or skin necrosis: No Has patient had a PCN reaction that required hospitalization: No Has patient had a PCN reaction occurring within the last 10 years: No If all of the above answers are "NO", then may proceed with Cephalosporin use.  Marland Kitchen Percocet [Oxycodone-Acetaminophen] Nausea And Vomiting  . Ace Inhibitors Nausea And Vomiting and Cough  . Iodinated Diagnostic Agents Nausea And Vomiting       . Iodine Itching  . Etodolac Nausea And Vomiting  . Tramadol Nausea And Vomiting  . Vicodin [Hydrocodone-Acetaminophen] Nausea And Vomiting   Medications Prior to Admission  Medication Sig Dispense Refill  . acetaminophen (TYLENOL) 500 MG tablet Take 1,000 mg by mouth every 6 (six) hours as needed for headache (pain).    Marland Kitchen albuterol (PROVENTIL HFA;VENTOLIN HFA) 108 (90 Base) MCG/ACT inhaler Inhale 2 puffs into the lungs every 6 (six) hours as needed for wheezing. (Patient taking differently: Inhale 2 puffs into the lungs every 6 (six) hours as  needed for wheezing or shortness of breath. ) 1 Inhaler 3  . amLODipine (NORVASC) 10 MG tablet Take 1 tablet (10 mg total) by mouth daily. (Patient taking differently: Take 10 mg by mouth daily at 2 PM. ) 30 tablet 1  . aspirin 81 MG chewable tablet Chew 81 mg by mouth daily.    Marland Kitchen atorvastatin (LIPITOR) 80 MG tablet Take 1 tablet (80 mg total) by mouth daily. (Patient taking differently: Take 80 mg by mouth every Thursday. ) 30 tablet 3  . budesonide-formoterol (SYMBICORT) 160-4.5 MCG/ACT inhaler Inhale 2 puffs into the lungs 2 (two) times daily. 1 Inhaler 1  . carvedilol (COREG) 6.25 MG tablet Take 6.25 mg by mouth 2 (two) times daily.    . cloNIDine (CATAPRES) 0.3 MG tablet Take 1 tablet (0.3 mg total) by mouth 2 (two) times daily. (Patient taking differently: Take 0.3 mg by mouth 2 (two) times daily. at 2pm and after supper) 60 tablet 1  . clopidogrel (PLAVIX) 75 MG tablet Take 1 tablet (75 mg total) by mouth daily. 30 tablet 3  . furosemide (LASIX) 40 MG tablet Take 1 tablet (40 mg total) by mouth daily at 2 PM. (Patient taking differently: Take 40 mg by mouth daily. )    . hydrALAZINE (APRESOLINE)  25 MG tablet Take 1 tablet (25 mg total) by mouth every 8 (eight) hours. 90 tablet 0  . ipratropium-albuterol (DUONEB) 0.5-2.5 (3) MG/3ML SOLN Take 3 mLs by nebulization every 4 (four) hours as needed. (Patient taking differently: Take 3 mLs by nebulization every 4 (four) hours as needed (for breathing). ) 360 mL 0  . potassium chloride (KLOR-CON) 10 MEQ tablet Take 2 tablets (20 mEq total) by mouth daily. (Patient taking differently: Take 20 mEq by mouth daily at 2 PM. ) 30 tablet 1  . valsartan (DIOVAN) 320 MG tablet Take 320 mg by mouth daily.     . [DISCONTINUED] metFORMIN (GLUCOPHAGE) 500 MG tablet Take 1 tablet (500 mg total) by mouth 2 (two) times daily with a meal. 30 tablet 1  . guaiFENesin (MUCINEX) 600 MG 12 hr tablet Take 2 tablets (1,200 mg total) by mouth 2 (two) times daily. (Patient not  taking: Reported on 03/27/2020) 14 tablet 0    Home: [place] expects to be discharged to:: Private residence Living Arrangements: Children Available Help at Discharge: Family, Available 24 hours/day Type of Home: House Home Access: Stairs to enter [employer] of Steps: 2 Home Layout: One level Bathroom Shower/Tub: Chiropodist: Standard Home Equipment: Environmental consultant - 2 wheels, Bedside commode, Tub bench  Functional History: Prior Function Level of Independence: Independent Comments: daughter does most housework and heavy meal prep Functional Status:  Mobility: Bed Mobility Overal bed mobility: Needs Assistance Bed Mobility: Supine to Sit Supine to sit: Max assist, +2 for physical assistance, +2 for safety/equipment General bed mobility comments: increased time to initiate with assist for trunk and to fully scoot LEs over EOB/hips towards EOB. semi roll to left with cues throughout. Dizziness with transition to sitting with right beating nystagmus noted grossly 1 min Transfers Overall transfer level: Needs assistance Equipment used: 2 person hand held assist Transfers: Sit to/from Stand, Stand Pivot Transfers Sit to Stand: Mod assist, +2 physical assistance, +2 safety/equipment Stand pivot transfers: Mod assist, +2 physical assistance, +2 safety/equipment General transfer comment: assist to rise and stabilize in standing, +2 via face to face method. performed x2 sit<>stand from EOB, stand pivot EOB>BSC>recliner with steadying assist throughout, cues for sequencing steps and multimodal cues to reach back for recliner armrest  Ambulation/Gait General Gait Details: not yet ready to attempt    ADL: ADL Overall ADL's : Needs assistance/impaired Eating/Feeding: NPO Grooming: Minimal assistance, Sitting Upper Body Bathing: Minimal assistance, Sitting Lower Body Bathing: Maximal assistance, +2 for physical assistance, +2 for  safety/equipment, Sit to/from stand Upper Body Dressing : Sitting, Moderate assistance Lower Body Dressing: Total assistance, +2 for physical assistance, +2 for safety/equipment, Sit to/from stand Toilet Transfer: Moderate assistance, +2 for physical assistance, +2 for safety/equipment, Stand-pivot, BSC Toileting- Clothing Manipulation and Hygiene: Total assistance, +2 for physical assistance, +2 for safety/equipment, Sit to/from stand Toileting - Clothing Manipulation Details (indicate cue type and reason): totalA for posterior pericare after BM Functional mobility during ADLs: Maximal assistance, +2 for physical assistance, +2 for safety/equipment (HHA, maxA+2 bed, modA+2 transfer)  Cognition: Cognition Overall Cognitive Status: Impaired/Different from baseline Orientation Level: Oriented to person, Oriented to place, Disoriented to time, Disoriented to situation Cognition Arousal/Alertness: Awake/alert Behavior During Therapy: WFL for tasks assessed/performed Overall Cognitive Status: Impaired/Different from baseline Area of Impairment: Orientation, Attention, Memory, Following commands, Safety/judgement, Awareness, Problem solving Orientation Level: Disoriented to, Time Current Attention Level: Selective Memory: Decreased recall of precautions, Decreased short-term memory Following Commands: Follows one step commands with  increased time Safety/Judgement: Decreased awareness of safety, Decreased awareness of deficits Awareness: Emergent Problem Solving: Requires verbal cues, Requires tactile cues, Difficulty sequencing, Decreased initiation, Slow processing General Comments: confusion noted at times and increased time to initiate tasks; intermittent cues for safety including instructing pt not to stand until therapists ready to do so   Blood pressure (!) 167/76, pulse (!) 59, temperature 98.3 F (36.8 C), temperature source Oral, resp. rate (!) 25, height 5\' 4"  (1.626 m), weight 116.9  kg, SpO2 93 %. Physical Exam General: Alert and oriented x 3, No apparent distress HEENT: Head is normocephalic, atraumatic, PERRLA, EOMI, sclera anicteric, oral mucosa pink and moist, dentition intact, ext ear canals clear,  Neck: Supple without JVD or lymphadenopathy Heart: Reg rate and rhythm. No murmurs rubs or gallops Chest: CTA bilaterally without wheezes, rales, or rhonchi; no distress Abdomen: Soft, tender over abdominal incision  Extremities: No clubbing, cyanosis, or edema. Pulses are 2+ Skin: abdominal incision C/D/I Neuro: Patient is alert.  Speech is dysarthric.  Follows simple commands. Difficulty elevating right leg. 4/5 DF and PF on right side. 3/5 strength throughout LLE. 4/5 strength in bilateral upper extremities. FTN intact Psych: Pt's affect is appropriate. Pt is cooperative. Great sense of humor    Results for orders placed or performed during the hospital encounter of 05/24/20 (from the past 24 hour(s))  Glucose, capillary     Status: Abnormal   Collection Time: 05/29/20 12:12 PM  Result Value Ref Range   Glucose-Capillary 112 (H) 70 - 99 mg/dL  CBC     Status: Abnormal   Collection Time: 05/29/20 12:56 PM  Result Value Ref Range   WBC 17.4 (H) 4.0 - 10.5 K/uL   RBC 3.39 (L) 3.87 - 5.11 MIL/uL   Hemoglobin 9.7 (L) 12.0 - 15.0 g/dL   HCT 29.3 (L) 36 - 46 %   MCV 86.4 80.0 - 100.0 fL   MCH 28.6 26.0 - 34.0 pg   MCHC 33.1 30.0 - 36.0 g/dL   RDW 16.4 (H) 11.5 - 15.5 %   Platelets 188 150 - 400 K/uL   nRBC 0.4 (H) 0.0 - 0.2 %  Glucose, capillary     Status: Abnormal   Collection Time: 05/29/20  3:45 PM  Result Value Ref Range   Glucose-Capillary 113 (H) 70 - 99 mg/dL  Glucose, capillary     Status: Abnormal   Collection Time: 05/29/20  7:46 PM  Result Value Ref Range   Glucose-Capillary 114 (H) 70 - 99 mg/dL  Glucose, capillary     Status: Abnormal   Collection Time: 05/29/20 11:31 PM  Result Value Ref Range   Glucose-Capillary 106 (H) 70 - 99 mg/dL   Glucose, capillary     Status: Abnormal   Collection Time: 05/30/20  3:42 AM  Result Value Ref Range   Glucose-Capillary 110 (H) 70 - 99 mg/dL  Basic metabolic panel     Status: Abnormal   Collection Time: 05/30/20  5:01 AM  Result Value Ref Range   Sodium 143 135 - 145 mmol/L   Potassium 3.7 3.5 - 5.1 mmol/L   Chloride 108 98 - 111 mmol/L   CO2 25 22 - 32 mmol/L   Glucose, Bld 129 (H) 70 - 99 mg/dL   BUN 28 (H) 8 - 23 mg/dL   Creatinine, Ser 1.28 (H) 0.44 - 1.00 mg/dL   Calcium 8.3 (L) 8.9 - 10.3 mg/dL   GFR calc non Af Amer 42 (L) >60 mL/min   GFR  calc Af Amer 49 (L) >60 mL/min   Anion gap 10 5 - 15  CBC     Status: Abnormal   Collection Time: 05/30/20  5:01 AM  Result Value Ref Range   WBC 15.4 (H) 4.0 - 10.5 K/uL   RBC 3.15 (L) 3.87 - 5.11 MIL/uL   Hemoglobin 9.0 (L) 12.0 - 15.0 g/dL   HCT 28.4 (L) 36 - 46 %   MCV 90.2 80.0 - 100.0 fL   MCH 28.6 26.0 - 34.0 pg   MCHC 31.7 30.0 - 36.0 g/dL   RDW 16.9 (H) 11.5 - 15.5 %   Platelets 208 150 - 400 K/uL   nRBC 0.5 (H) 0.0 - 0.2 %  Glucose, capillary     Status: Abnormal   Collection Time: 05/30/20  8:07 AM  Result Value Ref Range   Glucose-Capillary 125 (H) 70 - 99 mg/dL  Glucose, capillary     Status: Abnormal   Collection Time: 05/30/20 11:21 AM  Result Value Ref Range   Glucose-Capillary 128 (H) 70 - 99 mg/dL   DG Chest Port 1V same Day  Result Date: 05/30/2020 CLINICAL DATA:  Status post extubation.  Shortness of breath. EXAM: PORTABLE CHEST 1 VIEW COMPARISON:  May 28, 2020 FINDINGS: Endotracheal tube and nasogastric tube have been removed. Central catheter tip is in the superior vena cava. No pneumothorax. There is mild atelectatic change in the bases and right mid lung regions, stable. No new opacity evident. Heart is prominent, stable, with pulmonary vascularity normal. No adenopathy. There is aortic atherosclerosis. No bone lesions. IMPRESSION: No pneumothorax. Stable cardiac prominence. Areas of atelectatic  change, stable. No new opacity. Aortic Atherosclerosis (ICD10-I70.0). Electronically Signed   By: Lowella Grip III M.D.   On: 05/30/2020 07:57     Assessment/Plan: Diagnosis: left cerebellar stroke 1. Does the need for close, 24 hr/day medical supervision in concert with the patient's rehab needs make it unreasonable for this patient to be served in a less intensive setting? Yes 2. Co-Morbidities requiring supervision/potential complications: large right groin hematoma, aortic atherosclerosis, sigmoid diverticulosis, morbid obesity (44.24), DM, HTN 3. Due to bladder management, bowel management, safety, skin/wound care, disease management, medication administration, pain management and patient education, does the patient require 24 hr/day rehab nursing? Yes 4. Does the patient require coordinated care of a physician, rehab nurse, therapy disciplines of PT, OT to address physical and functional deficits in the context of the above medical diagnosis(es)? Yes Addressing deficits in the following areas: balance, endurance, locomotion, strength, transferring, bowel/bladder control, bathing, dressing, feeding, grooming, toileting and psychosocial support 5. Can the patient actively participate in an intensive therapy program of at least 3 hrs of therapy per day at least 5 days per week? Yes 6. The potential for patient to make measurable gains while on inpatient rehab is excellent 7. Anticipated functional outcomes upon discharge from inpatient rehab are modified independent  with PT, modI with OT, independent with SLP. 8. Estimated rehab length of stay to reach the above functional goals is: 10-14 days 9. Anticipated discharge destination: Home 10. Overall Rehab/Functional Prognosis: excellent  RECOMMENDATIONS: This patient's condition is appropriate for continued rehabilitative care in the following setting: CIR Patient has agreed to participate in recommended program. Yes Note that insurance  prior authorization may be required for reimbursement for recommended care.  Comment: Mrs. Valek is an excellent CIR candidate. Thank you for this consult. Admission coordinator to follow.   I have personally performed a face to face diagnostic evaluation,  including, but not limited to relevant history and physical exam findings, of this patient and developed relevant assessment and plan.  Additionally, I have reviewed and concur with the physician assistant's documentation above.  Leeroy Cha, MD'   Lavon Paganini Tasley, PA-C 05/30/2020

## 2020-05-30 NOTE — Progress Notes (Signed)
Patients daughter approached Nurses station at approx 8pm asking what mediation pt had received for pain. I looked at [facility] and pt had not received any pain meds. I told her she had tylenol and Tramadol ordered but had not received any today. Pts daughter then stated that "she is fine right now and doesn't need anything, I just wondered if she had been given any". Daughter then stated "I dont think she needs Tramadol it makes her nauseated". I again stated that she had not received any pain medications to which daughter said " OK I just wanted to check if she had been given anything". Daughter did not say at any time she wanted it discontinued.Jessie Foot, RN

## 2020-05-30 NOTE — Progress Notes (Signed)
Inpatient Rehab Admissions Coordinator Note:   Per therapy recommendations, pt was screened for CIR candidacy by Javonn Gauger, MS CCC-SLP. At this time, Pt. Appears to have functional decline and is a good candidate for CIR. Will request order for rehab consult per protocol.  Please contact me with questions.   Flower Franko, MS, CCC-SLP Rehab Admissions Coordinator  [phone] (celll) [phone] (office)  

## 2020-05-30 NOTE — Progress Notes (Signed)
Patient placed on CPAP with 2L o2. Pt is tolerating it well at this moment. RT will continue to monitor.

## 2020-05-30 NOTE — Progress Notes (Signed)
  Speech Language Pathology Treatment: Dysphagia  Patient Details Name: Sherry Fitzpatrick MRN: [id number] DOB: 1951/01/15 Today's Date: 05/30/2020 Time: 0017-4944 SLP Time Calculation (min) (ACUTE ONLY): 24 min  Assessment / Plan / Recommendation Clinical Impression  Pt with soft but clear vocal quality today. Tolerate sips of water and then 3 oz water swallow without signs of aspiration. Dentures found in bag, pt able to masticate well as long as assist for self feeding were provided. Pt may consume a mechanical soft diet with thin liquids. No SLP f/u needed for swallowing. Given dx of stroke and report of cognitive impairment will order and proceed with cognitive linguistic eval.   HPI HPI: 69 year old female with a history of stroke with no residual deficits who presented on 8/10 with NSTEMI and underwent PTCA with stenting to mid-LAD. While in the hospital today neurology was consulted for altered mental status. Exam revealed intermittent bilateral extensor posturing, right gaze deviation, weak gag and pupils mid and fixed.  MRI revealed multiple ischemic infarcts without hemorrhage.  Pt was intubated from 8/13-8/15.       SLP Plan  All goals met       Recommendations  Diet recommendations: Regular;Thin liquid Liquids provided via: Cup;Straw Medication Administration: Via alternative means Supervision: Patient able to self feed Postural Changes and/or Swallow Maneuvers: Seated upright 90 degrees                Oral Care Recommendations: Oral care QID;Oral care prior to ice chip/H20 Follow up Recommendations: Inpatient Rehab SLP Visit Diagnosis: Dysphagia, unspecified (R13.10) Plan: All goals met       GO                [name], Katherene Ponto 05/30/2020, 2:06 PM

## 2020-05-30 NOTE — Evaluation (Signed)
Occupational Therapy Evaluation Patient Details Name: Sherry Fitzpatrick MRN: [id number] DOB: [birth year] Today's Date: 05/30/2020    History of Present Illness 69 yo admitted with chest pain and NSTEMI s/p cath with abdominal wall hematoma.8/13 AMS with unresponsive episode with hypotension. 8/13-8/15 intubated. 8/14 MRI with patchy acute ischemic infarcts involving Rt cerebral and left cerebellum. PMHx; COPD, HTN, HLD, obesity, Rt THA, CVA, dysarthria   Clinical Impression   This 69 y/o female presents with the above. PTA pt reports being independent with ADL and functional mobility, living with daughter and 36 y/o granddaughter. Pt very pleasant and willing to participate in therapy session. She currently requires two person assist for safe completion of bed mobility and functional transfers, tolerating OOB to [facility] and then to recliner. Pt requiring up totalA (+2) for LB and toileting ADL. Overall pt presenting with weakness (R>L side), notable beating nystagmus and dizziness with initial sitting up at EOB, impaired cognition and standing balance. Pt to benefit from continued acute OT services and currently feel she is an excellent candidate for CIR level therapies at time of discharge to progress her towards her PLOF.   BP supine start of session: 191/72 End of session seated in recliner 192/81 (see vitals for full orthostatics taken) Spo2 >92% on RA HR in the 60s     Follow Up Recommendations  CIR    Equipment Recommendations  Other (comment);3 in 1 bedside commode (TBA)           Precautions / Restrictions Precautions Precautions: Fall Restrictions Weight Bearing Restrictions: No      Mobility Bed Mobility Overal bed mobility: Needs Assistance Bed Mobility: Supine to Sit     Supine to sit: Max assist;+2 for physical assistance;+2 for safety/equipment     General bed mobility comments: increased time to initiate with assist for trunk and to fully scoot LEs over EOB/hips  towards EOB  Transfers Overall transfer level: Needs assistance Equipment used: 2 person hand held assist Transfers: Sit to/from [facility] Sit to Stand: Mod assist;+2 physical assistance;+2 safety/equipment Stand pivot transfers: Mod assist;+2 physical assistance;+2 safety/equipment       General transfer comment: assist to rise and stabilize in standing, +2 via face to face method. performed x2 sit<>stand from EOB, stand pivot EOB>BSC>recliner with steadying assist throughout, cues for sequencing steps and multimodal cues to reach back for recliner armrest     Balance Overall balance assessment: Needs assistance Sitting-balance support: Feet supported;Single extremity supported Sitting balance-Leahy Scale: Fair Sitting balance - Comments: close minguard assist once stable at EOB   Standing balance support: Bilateral upper extremity supported Standing balance-Leahy Scale: Poor Standing balance comment: +2 assist for standing balance                            ADL either performed or assessed with clinical judgement   ADL Overall ADL's : Needs assistance/impaired Eating/Feeding: NPO   Grooming: Minimal assistance;Sitting   Upper Body Bathing: Minimal assistance;Sitting   Lower Body Bathing: Maximal assistance;+2 for physical assistance;+2 for safety/equipment;Sit to/from stand   Upper Body Dressing : Sitting;Moderate assistance   Lower Body Dressing: Total assistance;+2 for physical assistance;+2 for safety/equipment;Sit to/from stand   Toilet Transfer: Moderate assistance;+2 for physical assistance;+2 for safety/equipment;Stand-pivot;BSC   Toileting- Clothing Manipulation and Hygiene: Total assistance;+2 for physical assistance;+2 for safety/equipment;Sit to/from stand Toileting - Clothing Manipulation Details (indicate cue type and reason): totalA for posterior pericare after BM     Functional  mobility during ADLs: Maximal assistance;+2 for  physical assistance;+2 for safety/equipment (HHA, maxA+2 bed, modA+2 transfer)       Vision Baseline Vision/History: Wears glasses ("used to") Wears Glasses: Reading only Patient Visual Report: Other (comment) (dizziness with initial sitting up ) Vision Assessment?: Yes Eye Alignment: Within Functional Limits Ocular Range of Motion: Impaired-to be further tested in functional context (R beating nystagmus noted ) Alignment/Gaze Preference: Within Defined Limits Tracking/Visual Pursuits: Decreased smoothness of horizontal tracking;Impaired - to be further tested in functional context Additional Comments: pt with beating nystagmus noted with initial sitting up on EOB and with visual tracking towards the L - improved with prolonged time upright on EOB, will continue to assess      Perception     Praxis      Pertinent Vitals/Pain Pain Assessment: Faces Faces Pain Scale: Hurts even more Pain Location: R groin/buttocks region with certain movements  Pain Descriptors / Indicators: Discomfort;Grimacing;Sore Pain Intervention(s): Monitored during session;Repositioned;Limited activity within patient's tolerance     Hand Dominance Left   Extremity/Trunk Assessment Upper Extremity Assessment Upper Extremity Assessment: RUE deficits/detail;Generalized weakness RUE Deficits / Details: grossly weaker than LUE, pt denies sensation changes, weak grip RUE Coordination: decreased fine motor;decreased gross motor   Lower Extremity Assessment Lower Extremity Assessment: Defer to PT evaluation       Communication Communication Communication: No difficulties   Cognition Arousal/Alertness: Awake/alert Behavior During Therapy: WFL for tasks assessed/performed Overall Cognitive Status: Impaired/Different from baseline Area of Impairment: Orientation;Attention;Memory;Following commands;Safety/judgement;Awareness;Problem solving                 Orientation Level: Disoriented  to;Time Current Attention Level: Selective Memory: Decreased recall of precautions;Decreased short-term memory Following Commands: Follows one step commands with increased time Safety/Judgement: Decreased awareness of safety;Decreased awareness of deficits Awareness: Emergent Problem Solving: Requires verbal cues;Requires tactile cues;Difficulty sequencing;Decreased initiation;Slow processing General Comments: confusion noted at times and increased time to initiate tasks; intermittent cues for safety including instructing pt not to stand until therapists ready to do so    General Comments       Exercises     Shoulder Instructions      Home Living Family/patient expects to be discharged to:: Private residence Living Arrangements: Children Available Help at Discharge: Family;Available 24 hours/day Type of Home: House Home Access: Stairs to enter [employer] of Steps: 2   Home Layout: One level     Bathroom Shower/Tub: Teacher, early years/pre: Standard     Home Equipment: Environmental consultant - 2 wheels;Bedside commode;Tub bench          Prior Functioning/Environment Level of Independence: Independent        Comments: daughter does most housework and heavy meal prep        OT Problem List: Decreased strength;Decreased range of motion;Decreased activity tolerance;Impaired balance (sitting and/or standing);Decreased cognition;Decreased coordination;Decreased safety awareness;Decreased knowledge of use of DME or AE;Obesity;Cardiopulmonary status limiting activity;Impaired UE functional use      OT Treatment/Interventions: Self-care/ADL training;Therapeutic exercise;Energy conservation;DME and/or AE instruction;Therapeutic activities;Cognitive remediation/compensation;Patient/family education;Balance training    OT Goals(Current goals can be found in the care plan section) Acute Rehab OT Goals Patient Stated Goal: get back home so she can play with her  granddaughter  OT Goal Formulation: With patient Time For Goal Achievement: 06/13/20 Potential to Achieve Goals: Good  OT Frequency: Min 2X/week   Barriers to D/C:            Co-evaluation PT/OT/SLP Co-Evaluation/Treatment: Yes Reason for Co-Treatment: For patient/therapist  safety;To address functional/ADL transfers   OT goals addressed during session: ADL's and self-care      AM-PAC OT "6 Clicks" Daily Activity     Outcome Measure Help from another person eating meals?: Total (NPO) Help from another person taking care of personal grooming?: A Lot Help from another person toileting, which includes using toliet, bedpan, or urinal?: Total Help from another person bathing (including washing, rinsing, drying)?: A Lot Help from another person to put on and taking off regular upper body clothing?: A Lot Help from another person to put on and taking off regular lower body clothing?: Total 6 Click Score: 9   End of Session Equipment Utilized During Treatment: Gait belt Nurse Communication: Mobility status  Activity Tolerance: Patient tolerated treatment well Patient left: in chair;with call bell/phone within reach;with chair alarm set  OT Visit Diagnosis: Other abnormalities of gait and mobility (R26.89);Muscle weakness (generalized) (M62.81);Other symptoms and signs involving cognitive function                Time: 5701-7793 OT Time Calculation (min): 36 min Charges:  OT General Charges $OT Visit: 1 Visit OT Evaluation $OT Eval High Complexity: 1 High  Lou Cal, OT Acute Rehabilitation Services Pager [phone] Office [place] 05/30/2020, 10:15 AM

## 2020-05-30 NOTE — Progress Notes (Signed)
STROKE TEAM PROGRESS NOTE     INTERVAL HISTORY Sherry Fitzpatrick is sitting up in bed.  Sherry Fitzpatrick is neurologically improved.  Sherry Fitzpatrick is awake alert and interactive.  Dysarthria is improving.  Right-sided strength is also improving though Sherry Fitzpatrick still has some mild right hand and hip flexor weakness.  Sherry Fitzpatrick has been seen by therapist who recommended inpatient rehab   Vital signs are stable.  OBJECTIVE Vitals:   05/30/20 0600 05/30/20 0700 05/30/20 0805 05/30/20 0815  BP: 131/72 (!) 151/63  (!) 167/76  Pulse: (!) 51 (!) 52  (!) 59  Resp: 19 (!) 23  (!) 25  Temp:   97.8 F (36.6 C)   TempSrc:   Oral   SpO2: 100% 100%  93%  Weight:      Height:        CBC:  Recent Labs  Lab 05/29/20 1256 05/30/20 0501  WBC 17.4* 15.4*  HGB 9.7* 9.0*  HCT 29.3* 28.4*  MCV 86.4 90.2  PLT 188 676    Basic Metabolic Panel:  Recent Labs  Lab 05/27/20 1558 05/27/20 1907 05/29/20 0348 05/30/20 0501  NA 140   < > 139 143  K 4.1   < > 3.9 3.7  CL 107   < > 109 108  CO2 22   < > 21* 25  GLUCOSE 176*   < > 129* 129*  BUN 29*   < > 30* 28*  CREATININE 2.37*   < > 1.36* 1.28*  CALCIUM 8.0*   < > 8.4* 8.3*  MG 2.2  --   --   --    < > = values in this interval not displayed.    Lipid Panel:     Component Value Date/Time   CHOL 198 05/25/2020 0107   TRIG 192 (H) 05/28/2020 0602   HDL 40 (L) 05/25/2020 0107   CHOLHDL 5.0 05/25/2020 0107   VLDL 13 05/25/2020 0107   LDLCALC 145 (H) 05/25/2020 0107   HgbA1c:  Lab Results  Component Value Date   HGBA1C 7.3 (H) 05/24/2020   Urine Drug Screen:     Component Value Date/Time   LABOPIA NONE DETECTED 07/01/2018 0625   COCAINSCRNUR NONE DETECTED 07/01/2018 0625   LABBENZ NONE DETECTED 07/01/2018 0625   AMPHETMU NONE DETECTED 07/01/2018 0625   THCU NONE DETECTED 07/01/2018 0625   LABBARB NONE DETECTED 07/01/2018 0625    Alcohol Level No results found for: [place]  EEG 05/28/2020 ABNORMALITY  Excessive beta, generalized -Continuous slow, generalized   IMPRESSION:  This study is suggestive of severe diffuse encephalopathy, nonspecific etiology but likely related to sedation. No seizures or epileptiform discharges were seen throughout the recording.   CT ABDOMEN PELVIS WO CONTRAST 05/27/2020 IMPRESSION:  1. Large right groin hematoma relatively similar to the prior CT. Correlation with H/H levels and continued follow-up as clinically indicated.  2. No hydronephrosis or nephrolithiasis.  3. Sigmoid diverticulosis. No bowel obstruction. Normal appendix.  4. Aortic Atherosclerosis (ICD10-I70.0).   CT HEAD WO CONTRAST 05/27/2020 IMPRESSION:  1. No acute intracranial pathology.  2. Mild chronic microvascular ischemic changes.   MR BRAIN WO CONTRAST MR ANGIO HEAD WO CONTRAST MR ANGIO NECK WO CONTRAST 05/28/2020  MRI HEAD  IMPRESSION:  1. Patchy small volume acute ischemic infarcts involving the right splenium and right cerebral white matter as above, with additional punctate acute ischemic left cerebellar infarct. No associated hemorrhage or mass effect.  2. Underlying age-related cerebral atrophy with moderate chronic microvascular ischemic disease.   MRA  HEAD  IMPRESSION:  1. Motion degraded exam.  2. Negative MRA for large vessel occlusion.  3. Extensive intracranial atherosclerotic disease with multifocal moderate to severe stenoses involving both the anterior and posterior circulation as detailed above.   MRA NECK  IMPRESSION:  1. Approximate 30-40% stenoses about the origins of both internal carotid arteries, left slightly worse than right. No other hemodynamically significant stenosis or other abnormality about either carotid artery system within the neck.  2. Wide patency of both vertebral arteries within the neck.   Portable Chest xray 05/28/2020 IMPRESSION:  No significant change from the prior study.  DG CHEST PORT 1 VIEW 05/27/2020 IMPRESSION:  ETT tip approximately 2.1 cm above the carina. Appropriately positioned  enteric tube. Left IJ CVC tip overlies the superior cavoatrial junction. Perihilar and bibasilar opacities, unchanged.  Cardiomegaly.   VAS [place] GROIN PSEUDOANEURYSM 05/27/2020 Summary: No evidence of pseudoaneurysm, AVF or DVT    --------------------------------------------------------------------------------     Preliminary    Transthoracic Echocardiogram  03/28/20 IMPRESSIONS  1. Left ventricular ejection fraction, by estimation, is 60 to 65%. The  left ventricle has normal function. The left ventricle has no regional  wall motion abnormalities. There is moderate left ventricular hypertrophy.  Left ventricular diastolic  parameters are indeterminate.  2. Right ventricular systolic function is normal. The right ventricular  size is normal. There is normal pulmonary artery systolic pressure.  3. The mitral valve is normal in structure. Trivial mitral valve  regurgitation.  4. The aortic valve was not well visualized. Aortic valve regurgitation  is not visualized. Mild aortic valve stenosis. Vmax 2.4 m/s, MG 12 mmHg,  AVA 1.7 cm^2, DI 0.45   ECG - SR rate 64 BPM. (See cardiology reading for complete details)   PHYSICAL EXAM   Blood pressure (!) 167/76, pulse (!) 59, temperature 97.8 F (36.6 C), temperature source Oral, resp. rate (!) 25, height 5\' 4"  (1.626 m), weight 116.9 kg, SpO2 93 %. Sherry Fitzpatrick  . Afebrile. Head is nontraumatic. Neck is supple without bruit.    Cardiac exam no murmur or gallop. Lungs are clear to auscultation. Distal pulses are well felt. Neurological Exam :  Sherry Fitzpatrick is awake and alert.  Sherry Fitzpatrick speech is mildly dysarthric but improved.   Sherry Fitzpatrick follows simple midline and one-step commands.  Extraocular movements appear full range.  Sherry Fitzpatrick blinks to threat bilaterally.  Fundi not visualized.  Mild right lower facial asymmetry when Sherry Fitzpatrick smiles.  Tongue midline.  Able to move left upper and lower extremity briskly off the bed.  Mild right hemiparesis with  right grip and intrinsic hand muscle and hip flexor weakness..  Sensation appears intact bilaterally.  Plantars were equivocal.  Gait not tested     ASSESSMENT/PLAN Sherry Fitzpatrick is a 69 y.o. female with history of essential hypertension, stroke with no residual effects, obesity, OSA, hyperlipidemia, and heart murmur admitted to [facility] 05/24/2020 with a diagnosis of non-STEMI -> left cardiac catheterization/PTCA stenting to the mid Fitzpatrick on 05/24/20.  Neurology consulted 8/13 for AMS. On arrival Sherry Fitzpatrick found with intermittent bilateral extensor posturing, right gaze deviation, weak gag and pupils mid and fixed with hypotension - SBP 64.  Sherry Fitzpatrick did not receive IV t-PA due to instability.  Stroke: Patchy small volume acute ischemic infarcts involving the right splenium and right cerebral white matter, right hippocampus with additional punctate acute ischemic left cerebellar infarct - likely embolic from new atrial fibrillation versus post STEMI procedure  Resultant right hemiparesis code Stroke CT  Head -      CT head - No acute intracranial pathology. Mild chronic microvascular ischemic changes.  MRI head - Patchy small volume acute ischemic infarcts involving the right splenium and right cerebral white matter as above, with additional punctate acute ischemic left cerebellar infarct. Underlying age-related cerebral atrophy with moderate chronic microvascular ischemic disease.   MRA head - Extensive intracranial atherosclerotic disease with multifocal moderate to severe stenoses involving both the anterior and posterior circulation.  MRA Neck - Approximate 30-40% stenoses about the origins of both internal carotid arteries, left slightly worse than right  CTA H&N - not ordered  CT Perfusion - not ordered  Carotid Doppler - MRA neck ordered - carotid dopplers not indicated.  EEG - This study is suggestive of severe diffuse encephalopathy, nonspecific etiology but likely related to sedation. No seizures  or epileptiform discharges were seen throughout the recording.   2D Echo - 03/28/20 - EF 60 - 65%. No cardiac source of emboli identified.   [place] Corona Virus 2 - negative  LDL - 145  HgbA1c - 7.3  UDS - not ordered  VTE prophylaxis - SCDs Diet  Diet Order            DIET DYS 3 Room service appropriate? Yes; Fluid consistency: Thin  Diet effective now                 Aspirin 81 mg and Plavix 75 mg daily prior to admission, now on Aspirin 81 mg and Plavix 75 mg daily  Sherry Fitzpatrick counseled to be compliant with Sherry Fitzpatrick antithrombotic medications  Ongoing aggressive stroke risk factor management  Therapy recommendations:  pending  Disposition:  Pending  Hypertension  Home BP meds: Norvasc ; Coreg ; Catapres ; Apresoline ; Diovan  Current BP meds: vasopressin  Stable . Permissive hypertension (OK if < 220/120) but gradually normalize in 5-7 days if OK with Cardiology . Long-term BP goal normotensive  Hyperlipidemia  Home Lipid lowering medication: Lipitor 80 mg daily  LDL 145, goal < 70  Current lipid lowering medication: Lipitor 80 mg daily   Continue statin at discharge  Diabetes  Home diabetic meds: none   Current diabetic meds: insulin  HgbA1c 7.3, goal < 7.0 Recent Labs    05/29/20 2331 05/30/20 0342 05/30/20 0807  GLUCAP 106* 110* 125*    Other Stroke Risk Factors  Advanced age  ETOH use, advised to drink no more than 1 alcoholic beverage per day.  Obesity, Body mass index is 44.24 kg/m., recommend weight loss, diet and exercise as appropriate   Hx stroke/TIA  Coronary artery disease  Obstructive sleep apnea,  Not on CPAP at home  Other Active Problems  Code status - Full code  Intubated  CKD - stage 4 - creatinine - 2.37->2.01->1.36  Anemia - Hgb - 8.8->6.8->8.3 - transfused - 10.2  Groin hematoma  Leukocytosis - WBC's 28.3->28.1->19.7->18.2 (afebrile) Cefepime and Vancomycin-> off - now on Rocephin for pneumonia  Aortic  Atherosclerosis (ICD10-I70.0).   Non STEMI - Fitzpatrick stent 8/10  New onset atrial fibrillation -> long-term anticoagulation later when stable   Bradycardia 40's - 50's   Hospital day # 6 Sherry Fitzpatrick seems to be doing well and I expect continuing ongoing improvement with the therapies and likely short stay in rehab.  Sherry Fitzpatrick is likely going to need dual antiplatelet therapy at least for a few months given Sherry Fitzpatrick cardiac stenting and then switch to single agent antiplatelet along with Eliquis because of Sherry Fitzpatrick atrial fibrillation if  abdominal wall hematoma and hemoglobin stays stable.  Discussed with Dr. Terrence Dupont and answered questions.  Greater than 50% time during the 25-minute visit were spent in counseling and coordination of care about Sherry Fitzpatrick strokes and need for anticoagulation for stroke prevention and discussion of risk benefits.  Stroke team will sign off kindly call for questions.   Antony Contras, MD  To contact Stroke Continuity provider, please refer to [email or web address] After hours, contact General Neurology

## 2020-05-30 NOTE — Evaluation (Signed)
Speech Language Pathology Evaluation Patient Details Name: Sherry Fitzpatrick MRN: [id number] DOB: 25-Dec-1950 Today's Date: 05/30/2020 Time: 1245-8099 SLP Time Calculation (min) (ACUTE ONLY): 23 min  Problem List:  Patient Active Problem List   Diagnosis Date Noted  . Cerebral thrombosis with cerebral infarction 05/29/2020  . Shock ([place])   . Acute non-Q wave non-ST elevation myocardial infarction (NSTEMI) ([place]) 05/24/2020  . Hypertensive encephalopathy 03/28/2020  . Gout attack 03/28/2020  . Stroke-like symptoms 03/27/2020  . Normocytic anemia 03/27/2020  . History of CVA (cerebrovascular accident) 03/27/2020  . Acute respiratory failure with hypoxia ([place]) 11/22/2019  . COPD with acute exacerbation ([place]) 11/22/2019  . Acute asthma exacerbation 11/22/2019  . Morbid obesity ([place]) 10/29/2019  . CKD (chronic kidney disease), stage III 10/29/2019  . Reactive airway disease without complication   . Dyspnea 07/13/2019  . Acute ischemic stroke ([place]) 05/05/2019  . AKI (acute kidney injury) ([place]) 07/03/2018  . Hypertensive emergency 07/01/2018  . Tobacco abuse 07/01/2018  . Hypokalemia 07/01/2018  . Chronic diastolic CHF (congestive heart failure) ([place]) 07/01/2018  . Positive D dimer   . Hypertensive urgency 02/22/2018  . Malignant hypertension 02/20/2018  . Shortness of breath 02/20/2018  . Stroke ([place])   . Chest pain 04/14/2015  . Right sided weakness 04/14/2015  . Hyperlipidemia   . Body mass index 40.0-44.9, adult ([place])   . Essential hypertension   . Blood glucose elevated   . Asthma   . OSA (obstructive sleep apnea) 05/06/2013   Past Medical History:  Past Medical History:  Diagnosis Date  . Alterations of sensations, late effect of cerebrovascular disease(438.6)   . Arthritis    "all over"  . Backache, unspecified   . Body mass index 40.0-44.9, adult ([place])   . Carpal tunnel syndrome   . Dizziness and giddiness   . Esophageal reflux   . Generalized pain   . Headache     "had alot til I had pituitary tumor removed"  . Heart murmur   . Other abnormal glucose   . Other and unspecified hyperlipidemia   . Other malaise and fatigue   . Other postprocedural status(V45.89)   . Other symptoms involving cardiovascular system   . Sleep apnea    "did test; they wanted me to get a CPAP but I never did get it" (04/14/2015) Does use inhaler at night (01 2021)  . Stroke ([place]) 06/2003; 02/2010   "minor; minor" ; denies residual on 04/14/2015  . Unspecified disorder of the pituitary gland and its hypothalamic control   . Unspecified essential hypertension   . Unspecified visual disturbance    Past Surgical History:  Past Surgical History:  Procedure Laterality Date  . CARPAL TUNNEL RELEASE Left ~ 2014  . CORONARY STENT INTERVENTION N/A 05/24/2020   Procedure: CORONARY STENT INTERVENTION;  Surgeon: Jettie Booze, MD;  Location: [facility];  Service: Cardiovascular;  Laterality: N/A;  . JOINT REPLACEMENT    . LEFT HEART CATH AND CORONARY ANGIOGRAPHY N/A 05/24/2020   Procedure: LEFT HEART CATH AND CORONARY ANGIOGRAPHY;  Surgeon: Charolette Forward, MD;  Location: [facility];  Service: Cardiovascular;  Laterality: N/A;  . TOTAL HIP ARTHROPLASTY Right 11/06/2010  . TRANSPHENOIDAL / TRANSNASAL HYPOPHYSECTOMY / RESECTION PITUITARY TUMOR  06/13/2012  . TUBAL LIGATION  [id number]   HPI:  69 year old female with a history of stroke with no residual deficits who presented on 8/10 with NSTEMI and underwent PTCA with stenting to mid-LAD. While in the hospital today  neurology was consulted for altered mental status. Exam revealed intermittent bilateral extensor posturing, right gaze deviation, weak gag and pupils mid and fixed.  MRI revealed multiple ischemic infarcts without hemorrhage.  Pt was intubated from 8/13-8/15.    Assessment / Plan / Recommendation Clinical Impression  Pt demonstrates adequate speech and language function. She does demonstrate some short term memory  impairment and a developing awareness of her situation and safety needs. During the session, pt initiate was fully disoriented to time and partially to situation. As session progressed and pt participated in appropriate conversation and basic functional tasks, her mentation gradually improved. By the end of session pt verbalized short term recall with minimal cueing and used enviroment to orient herself. Suspect primary impairments are related to ICU delirium. Will f/u for further diagnostic assessment.     SLP Assessment  SLP Recommendation/Assessment: Patient needs continued Speech Lanaguage Pathology Services SLP Visit Diagnosis: Attention and concentration deficit Attention and concentration deficit following: Cerebral infarction    Follow Up Recommendations  Inpatient Rehab    Frequency and Duration min 2x/week  2 weeks      SLP Evaluation Cognition  Overall Cognitive Status: Impaired/Different from baseline Arousal/Alertness: Awake/alert Orientation Level: Oriented to person;Oriented to place;Disoriented to time;Oriented to situation Attention: Focused;Sustained;Selective Focused Attention: Appears intact Sustained Attention: Appears intact Selective Attention: Appears intact Memory: Impaired Memory Impairment: Retrieval deficit;Decreased short term memory Decreased Short Term Memory: Verbal basic;Functional basic Awareness: Impaired Awareness Impairment: Intellectual impairment;Emergent impairment Problem Solving: Appears intact Safety/Judgment: Impaired       Comprehension  Auditory Comprehension Overall Auditory Comprehension: Appears within functional limits for tasks assessed    Expression Verbal Expression Overall Verbal Expression: Appears within functional limits for tasks assessed   Oral / Motor  Oral Motor/Sensory Function Overall Oral Motor/Sensory Function: Within functional limits Motor Speech Overall Motor Speech: Appears within functional limits for  tasks assessed   GO                   Herbie Baltimore, MA CCC-SLP  Acute Rehabilitation Services Pager [phone] Office [phone]  Lynann Beaver 05/30/2020, 2:19 PM

## 2020-05-30 NOTE — Evaluation (Signed)
Physical Therapy Evaluation Patient Details Name: Sherry Fitzpatrick MRN: [id number] DOB: 03/03/1951 Today's Date: 05/30/2020   History of Present Illness  69 yo admitted with chest pain and NSTEMI s/p cath with abdominal wall hematoma.8/13 AMS with unresponsive episode with hypotension. 8/13-8/15 intubated. 8/14 MRI with patchy acute ischemic infarcts involving Rt cerebral and left cerebellum. PMHx; COPD, HTN, HLD, obesity, Rt THA, CVA, dysarthria  Clinical Impression  Pt pleasant and cooperative although with decreased memory and processing. Pt with increased difficulty with exiting bed but able to perform stand pivot to [facility] and chair this session. Pt with decreased strength, cognition, mobility, activity tolerance and function who will benefit from acute therapy to maximize mobility. Pt with noted nystagmus with transition from side to sit which resolved with sitting and will continue to assess for vestibular assessment as pt able to progress mobility.   191/72 (101), HR 67 supine 207/113 (127) sitting 200/141 (152), HR 74 standing 192/81 (114) , HR 60 sitting in chair    Follow Up Recommendations CIR;Supervision/Assistance - 24 hour    Equipment Recommendations  None recommended by PT    Recommendations for Other Services       Precautions / Restrictions Precautions Precautions: Fall Precaution Comments: watch BP, permissive HTN 220/120 allowed Restrictions Weight Bearing Restrictions: No      Mobility  Bed Mobility Overal bed mobility: Needs Assistance Bed Mobility: Supine to Sit     Supine to sit: Max assist;+2 for physical assistance;+2 for safety/equipment     General bed mobility comments: increased time to initiate with assist for trunk and to fully scoot LEs over EOB/hips towards EOB. semi roll to left with cues throughout. Dizziness with transition to sitting with right beating nystagmus noted grossly 1 min  Transfers Overall transfer level: Needs  assistance Equipment used: 2 person hand held assist Transfers: Sit to/from [facility] Sit to Stand: Mod assist;+2 physical assistance;+2 safety/equipment Stand pivot transfers: Mod assist;+2 physical assistance;+2 safety/equipment       General transfer comment: assist to rise and stabilize in standing, +2 via face to face method. performed x2 sit<>stand from EOB, stand pivot EOB>BSC>recliner with steadying assist throughout, cues for sequencing steps and multimodal cues to reach back for recliner armrest   Ambulation/Gait             General Gait Details: not yet ready to attempt  Stairs            Wheelchair Mobility    Modified Rankin (Stroke Patients Only)       Balance Overall balance assessment: Needs assistance Sitting-balance support: Feet supported;Single extremity supported Sitting balance-Leahy Scale: Fair Sitting balance - Comments: close minguard assist once stable at EOB   Standing balance support: Bilateral upper extremity supported Standing balance-Leahy Scale: Poor Standing balance comment: +2 assist for standing balance                              Pertinent Vitals/Pain Pain Assessment: Faces Faces Pain Scale: Hurts even more Pain Location: R groin/buttocks region with certain movements  Pain Descriptors / Indicators: Discomfort;Grimacing;Sore Pain Intervention(s): Limited activity within patient's tolerance;Monitored during session;Repositioned    Home Living Family/patient expects to be discharged to:: Private residence Living Arrangements: Children Available Help at Discharge: Family;Available 24 hours/day Type of Home: House Home Access: Stairs to enter   [employer] of Steps: 2 Home Layout: One level Home Equipment: Walker - 2 wheels;Bedside commode;Tub bench  Prior Function Level of Independence: Independent         Comments: daughter does most housework and heavy meal prep      Hand Dominance   Dominant Hand: Left    Extremity/Trunk Assessment   Upper Extremity Assessment Upper Extremity Assessment: Defer to OT evaluation RUE Deficits / Details: grossly weaker than LUE, pt denies sensation changes, weak grip RUE Coordination: decreased fine motor;decreased gross motor    Lower Extremity Assessment Lower Extremity Assessment: Overall WFL for tasks assessed (pt grossly 3/5 strength bil LE with knee extension, hip flexion and knee flexion)    Cervical / Trunk Assessment Cervical / Trunk Assessment: Other exceptions (rounded shoulders)  Communication   Communication: No difficulties  Cognition Arousal/Alertness: Awake/alert Behavior During Therapy: WFL for tasks assessed/performed Overall Cognitive Status: Impaired/Different from baseline Area of Impairment: Orientation;Attention;Memory;Following commands;Safety/judgement;Awareness;Problem solving                 Orientation Level: Disoriented to;Time Current Attention Level: Selective Memory: Decreased recall of precautions;Decreased short-term memory Following Commands: Follows one step commands with increased time Safety/Judgement: Decreased awareness of safety;Decreased awareness of deficits Awareness: Emergent Problem Solving: Requires verbal cues;Requires tactile cues;Difficulty sequencing;Decreased initiation;Slow processing General Comments: confusion noted at times and increased time to initiate tasks; intermittent cues for safety including instructing pt not to stand until therapists ready to do so       General Comments      Exercises     Assessment/Plan    PT Assessment Patient needs continued PT services  PT Problem List Decreased strength;Decreased mobility;Decreased safety awareness;Decreased activity tolerance;Decreased balance;Decreased cognition       PT Treatment Interventions DME instruction;Therapeutic exercise;Gait training;Functional mobility  training;Therapeutic activities;Patient/family education;Balance training    PT Goals (Current goals can be found in the Care Plan section)  Acute Rehab PT Goals Patient Stated Goal: get back home so she can play with her granddaughter (37yo) PT Goal Formulation: With patient Time For Goal Achievement: 06/13/20 Potential to Achieve Goals: Fair    Frequency Min 3X/week   Barriers to discharge        Co-evaluation PT/OT/SLP Co-Evaluation/Treatment: Yes Reason for Co-Treatment: Complexity of the patient's impairments (multi-system involvement);For patient/therapist safety PT goals addressed during session: Mobility/safety with mobility OT goals addressed during session: ADL's and self-care       AM-PAC PT "6 Clicks" Mobility  Outcome Measure Help needed turning from your back to your side while in a flat bed without using bedrails?: Total Help needed moving from lying on your back to sitting on the side of a flat bed without using bedrails?: Total Help needed moving to and from a bed to a chair (including a wheelchair)?: A Lot Help needed standing up from a chair using your arms (e.g., wheelchair or bedside chair)?: A Lot Help needed to walk in hospital room?: Total Help needed climbing 3-5 steps with a railing? : Total 6 Click Score: 8    End of Session Equipment Utilized During Treatment: Gait belt Activity Tolerance: Patient tolerated treatment well Patient left: in chair;with call bell/phone within reach;with chair alarm set Nurse Communication: Mobility status PT Visit Diagnosis: Other abnormalities of gait and mobility (R26.89);Difficulty in walking, not elsewhere classified (R26.2);Muscle weakness (generalized) (M62.81)    Time: 2355-7322 PT Time Calculation (min) (ACUTE ONLY): 37 min   Charges:   PT Evaluation $PT Eval Moderate Complexity: 1 Mod          [place], PT Acute Rehabilitation Services Pager: [phone] Office: [phone]  Lailani Tool B  Latresa Gasser 05/30/2020, 10:26 AM

## 2020-05-30 NOTE — PMR Pre-admission (Addendum)
PMR Admission Coordinator Pre-Admission Assessment  Patient: Sherry Fitzpatrick is an 69 y.o., female MRN: [id number] DOB: 1950-11-23 Height: 5' 4" (162.6 cm) Weight: 116.8 kg              Insurance Information HMO: yes    PPO:      PCP:      IPA:      80/20:      OTHER:  PRIMARY: [employer]       Policy#: [id number]      Subscriber: Pt.  CM Name: Phineas Real    Phone#: [phone]     Fax#: [phone] Pre-Cert#: [id number] for 5 days with update due on 06/05/20    Employer:  Benefits:  Phone #:      Name:  Eff. Date: 10/16/2019 - 10/14/2020   Deduct:Deductible: does not have for in-network providers     Out of Pocket Max: OOP Max: $3,900 ($1,791.44 met)      Life Max: OOP Max: $3,900 ($1,791.44 met) CIR: CIR: $295/day co-pay for days 1-6, $0/day co-pay for days 7-90     SNF: SNF:  $0/day co-pay for days 1-20, $184/day co-pay for days 21-100; limited to 100 days/cal yr Outpatient: $10-$40/visit co-pay pending service; visits limited by medical necessity Home Health:  100% coverage, 0% co-insurance; limited by medical necessity DME: 80% coverage, 20% co-insurance Providers: in network  SECONDARY: none    The "Data Collection Information Summary" for patients in Inpatient Rehabilitation Facilities with attached "Privacy Act [place] Records" was provided and verbally reviewed with: Patient  Emergency Contact Information Contact Information    Name Relation Home Work [place] Daughter [phone]  [phone]   Delos Haring Daughter   [phone]     Current Medical History  Patient Admitting Diagnosis: NSTEMI, Altered Mental status  History of Present Illness: Sherry Fitzpatrick is a 69 y.o. right-handed female with history of hypertension, systolic congestive heart failure, CVA without residual, diabetes mellitus, hyperlipidemia, morbid obesity with BMI 44.24, CAD with stenting maintained on aspirin and Plavix.  Per chart review patient lives  with her children.  Reportedly independent prior to admission.  1 level home 2 steps to entry.  Daughter does most of the housework and heavy meal prep.  Presented 05/24/2020 with chest tightness/dyspnea radiating to the left shoulder and very little relief with nitroglycerin.  EKG normal sinus rhythm with ST-T wave changes in inferior lateral leads noted to have minimally elevated high-sensitivity troponin.  Started on IV heparin and nitro.  Underwent cardiac catheterization with stenting per cardiology services.  Hospital course complicated by hypotension.  On the morning of 05/27/2020 episode of emesis severe lower abdominal pain and right groin pain relieved with tramadol.  Later in the day episode of acute altered mental status/dysarthria question seizure with hypotension requiring dopamine.  MRI showed patchy small volume acute ischemic infarct involving the right splenium and right cerebral white matter with additional punctate acute ischemic left cerebellar infarct.  No associated hemorrhage.  MRA negative of the head.  MRA of the neck approximate 30 to 40% stenosis about the origins of both internal carotid arteries left slightly worse than right.  EEG negative for seizure.  Vascular ultrasound of groin showed no evidence of pseudoaneurysm or DVT.  Patient currently remains on aspirin and Plavix therapy for CVA prophylaxis.  She was started on Rocephin for CAP.  Currently on a dysphagia #3 diet.  Complete NIHSS TOTAL: 3 Glasgow Coma Scale Score: 15  Past Medical History  Past Medical History:  Diagnosis Date  . Alterations of sensations, late effect of cerebrovascular disease(438.6)   . Arthritis    "all over"  . Backache, unspecified   . Body mass index 40.0-44.9, adult ([place])   . Carpal tunnel syndrome   . Dizziness and giddiness   . Esophageal reflux   . Generalized pain   . Headache    "had alot til I had pituitary tumor removed"  . Heart murmur   . Other abnormal glucose   . Other and  unspecified hyperlipidemia   . Other malaise and fatigue   . Other postprocedural status(V45.89)   . Other symptoms involving cardiovascular system   . Sleep apnea    "did test; they wanted me to get a CPAP but I never did get it" (04/14/2015) Does use inhaler at night (01 2021)  . Stroke ([place]) 06/2003; 02/2010   "minor; minor" ; denies residual on 04/14/2015  . Unspecified disorder of the pituitary gland and its hypothalamic control   . Unspecified essential hypertension   . Unspecified visual disturbance     Family History  family history includes Diabetes in her father; Heart failure in her mother and sister; Hypertension in her daughter, mother, and sister.  Prior Rehab/Hospitalizations:  Has the patient had prior rehab or hospitalizations prior to admission? No  Has the patient had major surgery during 100 days prior to admission? Yes  Current Medications   Current Facility-Administered Medications:  .  0.9 %  sodium chloride infusion (Manually program via Guardrails IV Fluids), , Intravenous, Once, Bowser, Grace E, NP .  0.9 %  sodium chloride infusion (Manually program via Guardrails IV Fluids), , Intravenous, Once, Ogan, Okoronkwo U, MD .  0.9 %  sodium chloride infusion, 250 mL, Intravenous, PRN, Irish Lack, Jayadeep S, MD .  0.9 %  sodium chloride infusion, 250 mL, Intravenous, Continuous, Bowser, Laurel Dimmer, NP .  Place/Maintain arterial line, , , Until Discontinued **AND** 0.9 %  sodium chloride infusion, , Intra-arterial, PRN, Olalere, Adewale A, MD .  acetaminophen (TYLENOL) 160 MG/5ML solution 1,000 mg, 1,000 mg, Per Tube, Q6H PRN, Aslam, Sadia, MD .  albuterol (PROVENTIL) (2.5 MG/3ML) 0.083% nebulizer solution 2.5 mg, 2.5 mg, Nebulization, Q6H PRN, Charolette Forward, MD .  alum & mag hydroxide-simeth (MAALOX/MYLANTA) 200-200-20 MG/5ML suspension 30 mL, 30 mL, Oral, Q4H PRN, Charolette Forward, MD .  amiodarone (PACERONE) tablet 100 mg, 100 mg, Per Tube, Daily, Charolette Forward, MD, 100  mg at 06/01/20 0921 .  amLODipine (NORVASC) tablet 2.5 mg, 2.5 mg, Oral, Daily, Charolette Forward, MD, 2.5 mg at 06/01/20 0920 .  aspirin EC tablet 81 mg, 81 mg, Oral, Daily, Charolette Forward, MD, 81 mg at 06/01/20 1035 .  atorvastatin (LIPITOR) tablet 80 mg, 80 mg, Per Tube, Daily, Aslam, Sadia, MD, 80 mg at 06/01/20 0922 .  clopidogrel (PLAVIX) tablet 75 mg, 75 mg, Per Tube, Daily, Aslam, Sadia, MD, 75 mg at 06/01/20 0921 .  diphenhydrAMINE (BENADRYL) capsule 25 mg, 25 mg, Oral, Q8H PRN, Charolette Forward, MD .  docusate (COLACE) 50 MG/5ML liquid 100 mg, 100 mg, Per Tube, BID, Bowser, Laurel Dimmer, NP, 100 mg at 06/01/20 0920 .  ferrous sulfate tablet 325 mg, 325 mg, Oral, TID WC, Harwani, Mohan, MD, 325 mg at 06/01/20 1255 .  insulin aspart (novoLOG) injection 0-15 Units, 0-15 Units, Subcutaneous, TID WC, Aslam, Sadia, MD, 2 Units at 06/01/20 0920 .  insulin aspart (novoLOG) injection 4 Units, 4 Units, Subcutaneous, Q4H, Aslam, Sadia, MD, 4 Units  at 05/30/20 1216 .  isosorbide mononitrate (IMDUR) 24 hr tablet 30 mg, 30 mg, Oral, Daily, Charolette Forward, MD, 30 mg at 06/01/20 0921 .  lip balm (CARMEX) ointment, , Topical, PRN, Charolette Forward, MD .  MEDLINE mouth rinse, 15 mL, Mouth Rinse, BID, Harwani, Mohan, MD, 15 mL at 05/31/20 2130 .  mometasone-formoterol (DULERA) 200-5 MCG/ACT inhaler 2 puff, 2 puff, Inhalation, BID, Charolette Forward, MD, 2 puff at 06/01/20 0827 .  nitroGLYCERIN (NITROSTAT) SL tablet 0.4 mg, 0.4 mg, Sublingual, Q5 Min x 3 PRN, Charolette Forward, MD, 0.4 mg at 05/24/20 1635 .  ondansetron (ZOFRAN) injection 4 mg, 4 mg, Intravenous, Q6H PRN, Jettie Booze, MD, 4 mg at 05/27/20 0319 .  pantoprazole (PROTONIX) EC tablet 40 mg, 40 mg, Oral, QHS, Harwani, Mohan, MD, 40 mg at 05/31/20 2130 .  phenol (CHLORASEPTIC) mouth spray 1 spray, 1 spray, Mouth/Throat, PRN, Margaretha Seeds, MD .  polyethylene glycol (MIRALAX / GLYCOLAX) packet 17 g, 17 g, Per Tube, Daily, Aslam, Sadia, MD, 17 g at  06/01/20 0920 .  potassium chloride (KLOR-CON) CR tablet 20 mEq, 20 mEq, Oral, Daily, Charolette Forward, MD, 20 mEq at 06/01/20 0920 .  Racepinephrine HCl 2.25 % nebulizer solution 0.25 mL, 0.25 mL, Nebulization, Once, Margaretha Seeds, MD .  sodium chloride flush (NS) 0.9 % injection 3 mL, 3 mL, Intravenous, Q12H, Jettie Booze, MD, 3 mL at 05/31/20 2130 .  sodium chloride flush (NS) 0.9 % injection 3 mL, 3 mL, Intravenous, PRN, Jettie Booze, MD  Patients Current Diet:  Diet Order            DIET DYS 3 Room service appropriate? Yes; Fluid consistency: Thin  Diet effective now                 Precautions / Restrictions Precautions Precautions: Fall Precaution Comments: watch BP, permissive HTN 220/120 allowed Restrictions Weight Bearing Restrictions: No   Has the patient had 2 or more falls or a fall with injury in the past year?No  Prior Activity Level Community (5-7x/wk): Pt. went out daily   Prior Functional Level Prior Function Level of Independence: Independent Comments: daughter does most housework and heavy meal prep  Self Care: Did the patient need help bathing, dressing, using the toilet or eating?  Independent  Indoor Mobility: Did the patient need assistance with walking from room to room (with or without device)? Independent  Stairs: Did the patient need assistance with internal or external stairs (with or without device)? Independent  Functional Cognition: Did the patient need help planning regular tasks such as shopping or remembering to take medications? Independent  Home Assistive Devices / Equipment Home Assistive Devices/Equipment: None Home Equipment: Walker - 2 wheels, Bedside commode, Tub bench  Prior Device Use: Indicate devices/aids used by the patient prior to current illness, exacerbation or injury? None of the above  Current Functional Level Cognition  Arousal/Alertness: Awake/alert Overall Cognitive Status: Impaired/Different  from baseline Current Attention Level: Selective Orientation Level: Oriented X4 Following Commands: Follows one step commands with increased time Safety/Judgement: Decreased awareness of safety, Decreased awareness of deficits General Comments: confusion noted at times and increased time to initiate tasks; intermittent cues for safety including instructing pt not to stand until therapists ready to do so  Attention: Focused, Sustained, Selective Focused Attention: Appears intact Sustained Attention: Appears intact Selective Attention: Appears intact Memory: Impaired Memory Impairment: Retrieval deficit, Decreased short term memory Decreased Short Term Memory: Verbal basic, Functional basic Awareness: Impaired Awareness  Impairment: Intellectual impairment, Emergent impairment Problem Solving: Appears intact Safety/Judgment: Impaired    Extremity Assessment (includes Sensation/Coordination)  Upper Extremity Assessment: Defer to OT evaluation RUE Deficits / Details: grossly weaker than LUE, pt denies sensation changes, weak grip RUE Coordination: decreased fine motor, decreased gross motor  Lower Extremity Assessment: Overall WFL for tasks assessed (pt grossly 3/5 strength bil LE with knee extension, hip flexion and knee flexion)    ADLs  Overall ADL's : Needs assistance/impaired Eating/Feeding:Dysphagia 3 diet Grooming: Minimal assistance, Sitting Upper Body Bathing: Minimal assistance, Sitting Lower Body Bathing: Maximal assistance, +2 for physical assistance, +2 for safety/equipment, Sit to/from stand Upper Body Dressing : Sitting, Moderate assistance Lower Body Dressing: Total assistance, +2 for physical assistance, +2 for safety/equipment, Sit to/from stand Toilet Transfer: Moderate assistance, +2 for physical assistance, +2 for safety/equipment, Stand-pivot, BSC Toileting- Clothing Manipulation and Hygiene: Total assistance, +2 for physical assistance, +2 for safety/equipment, Sit  to/from stand Toileting - Clothing Manipulation Details (indicate cue type and reason): totalA for posterior pericare after BM Functional mobility during ADLs: Maximal assistance, +2 for physical assistance, +2 for safety/equipment (HHA, maxA+2 bed, modA+2 transfer)    Mobility  Overal bed mobility: Needs Assistance Bed Mobility: Supine to Sit Supine to sit: Max assist, +2 for physical assistance, +2 for safety/equipment General bed mobility comments: increased time to initiate with assist for trunk and to fully scoot LEs over EOB/hips towards EOB. semi roll to left with cues throughout. Dizziness with transition to sitting with right beating nystagmus noted grossly 1 min    Transfers  Overall transfer level: Needs assistance Equipment used: 2 person hand held assist Transfers: Sit to/from Stand, Stand Pivot Transfers Sit to Stand: Mod assist, +2 physical assistance, +2 safety/equipment Stand pivot transfers: Mod assist, +2 physical assistance, +2 safety/equipment General transfer comment: assist to rise and stabilize in standing, +2 via face to face method. performed x2 sit<>stand from EOB, stand pivot EOB>BSC>recliner with steadying assist throughout, cues for sequencing steps and multimodal cues to reach back for recliner armrest     Ambulation / Gait / Stairs / Wheelchair Mobility  Ambulation/Gait General Gait Details: not yet ready to attempt    Posture / Balance Dynamic Sitting Balance Sitting balance - Comments: close minguard assist once stable at EOB Balance Overall balance assessment: Needs assistance Sitting-balance support: Feet supported, Single extremity supported Sitting balance-Leahy Scale: Fair Sitting balance - Comments: close minguard assist once stable at EOB Standing balance support: Bilateral upper extremity supported Standing balance-Leahy Scale: Poor Standing balance comment: +2 assist for standing balance     Special needs/care consideration  Designated  visitor [place] (from acute therapy documentation) Living Arrangements: Children  Lives With: Daughter Available Help at Discharge: Family, Available 24 hours/day Type of Home: House Home Layout: One level Home Access: Stairs to enter Technical brewer of Steps: 2 Bathroom Shower/Tub: Chiropodist: [place]: No  Discharge Living Setting Plans for Discharge Living Setting: Patient's home Type of Home at Discharge: House Discharge Home Layout: One level Discharge Home Access: Level entry Discharge Bathroom Shower/Tub: Tub/shower unit Discharge Bathroom Toilet: Standard Discharge Bathroom Accessibility: Yes How Accessible: Accessible via walker Does the patient have any problems obtaining your medications?: No  Social/Family/Support Systems Patient Roles: Other (Comment) Contact Information: [phone] Anticipated Caregiver: Wilkie Aye (Pt.'s daughter works from home and pt. Will be going to stay with her and 79 year old grandson. Daughter confirms 24/7 support).  Anticipated  Caregiver's Contact Information: [phone] Ability/Limitations of Caregiver: none  Caregiver Availability: 24/7 Discharge Plan Discussed with Primary Caregiver: Yes Is Caregiver In Agreement with Plan?: Yes Does Caregiver/Family have Issues with Lodging/Transportation while Pt is in Rehab?: No   Goals Patient/Family Goal for Rehab: PT/OT/SLP Min A  Expected length of stay: 10-12 days Pt/Family Agrees to Admission and willing to participate: Yes Program Orientation Provided & Reviewed with Pt/Caregiver Including Roles  & Responsibilities: Yes   Decrease burden of Care through IP rehab admission: N/A  Possible need for SNF placement upon discharge:not anticipated    Patient Condition: This patient's medical and functional status has changed since the consult dated: 05/30/2020 in which the Rehabilitation  Physician determined and documented that the patient's condition is appropriate for intensive rehabilitative care in an inpatient rehabilitation facility. See "History of Present Illness" (above) for medical update. No functional changes. Patient's medical and functional status update has been discussed with the Rehabilitation physician and patient remains appropriate for inpatient rehabilitation. Will admit to inpatient rehab today.  Preadmission Screen Completed By:  Retta Diones, RN, 06/01/2020 1:19 PM (initially completed by Clemens Catholic, SLP and updated today by me) ______________________________________________________________________   Discussed status with Dr. Posey Pronto on 06/01/20 at 72 am and received approval for admission today.  Admission Coordinator:  Retta Diones, time 1319/Date 06/01/20

## 2020-05-30 NOTE — Progress Notes (Signed)
    Patient awake and alert.  Extubated.  Speech is clear.  She wants [place] home.   Exam shows 2+ right DP pulse.  No right groin hematoma.  Evidence of right abdominal wall hematoma. Appears to be doing well.  COntinue clopidogrel for stent along with supportive care.   Jettie Booze, MD

## 2020-05-30 NOTE — Progress Notes (Signed)
Inpatient Rehab Admissions Coordinator:   Met with patient at bedside to discuss potential CIR admission. Pt. Stated interest. Spoke with her daughter over the phone and she confirmed that she can provide 24/7 support.  Will pursue for potential admit this week, pending bed availability.   Clemens Catholic, [place], [place] [facility] Admissions Coordinator  [phone] ([place]) [phone] (office)

## 2020-05-30 NOTE — Progress Notes (Signed)
Pt confused and impulsive. Sherry Fitzpatrick noted to have a flat waveform. Upon entering the patients room it appeared that the Sherry Fitzpatrick was still intact however upon closer inspection the Sherry Fitzpatrick was dislodged to almost complete removal. Sherry Fitzpatrick removed, tip inspected and appeared intact, pressure held until hemostasis achieved and dressing applied.

## 2020-05-30 NOTE — Progress Notes (Signed)
Subjective:  Patient denies any chest pain or shortness of breath up in chair complains of soreness in right lower abdominal wall.  More alert awake speech improved  Objective:  Vital Signs in the last 24 hours: Temp:  [97.7 F (36.5 C)-98.9 F (37.2 C)] 98.3 F (36.8 C) (08/16 1120) Pulse Rate:  [46-79] 59 (08/16 0815) Resp:  [16-35] 25 (08/16 0815) BP: (130-212)/(55-93) 167/76 (08/16 0815) SpO2:  [90 %-100 %] 93 % (08/16 0815) Arterial Line BP: (89-166)/(53-69) 89/66 (08/16 0200) Weight:  [116.9 kg] 116.9 kg (08/16 0300)  Intake/Output from previous day: 08/15 0701 - 08/16 0700 In: 10.1 [I.V.:10.1] Out: 1800 [Urine:1800] Intake/Output from this shift: No intake/output data recorded.  Physical Exam: Neck: no adenopathy, no carotid bruit, no JVD and supple, symmetrical, trachea midline Lungs: Decreased breath sound at bases Heart: regular rate and rhythm, S1, S2 normal and Soft systolic murmur noted Abdomen: soft, non-tender; bowel sounds normal; no masses,  no organomegaly and Right lower abdominal wall tenderness noted no ecchymosis right groin stable Extremities: extremities normal, atraumatic, no cyanosis or edema  Lab Results: Recent Labs    05/29/20 1256 05/30/20 0501  WBC 17.4* 15.4*  HGB 9.7* 9.0*  PLT 188 208   Recent Labs    05/29/20 0348 05/30/20 0501  NA 139 143  K 3.9 3.7  CL 109 108  CO2 21* 25  GLUCOSE 129* 129*  BUN 30* 28*  CREATININE 1.36* 1.28*   No results for input(s): TROPONINI in the last 72 hours.  Invalid input(s): CK, MB Hepatic Function Panel Recent Labs    05/28/20 0602  PROT 5.1*  ALBUMIN 2.4*  AST 54*  ALT 38  ALKPHOS 43  BILITOT 0.9   No results for input(s): CHOL in the last 72 hours. No results for input(s): PROTIME in the last 72 hours.  Imaging: Imaging results have been reviewed and DG Chest Port 1V same Day  Result Date: 05/30/2020 CLINICAL DATA:  Status post extubation.  Shortness of breath. EXAM: PORTABLE  CHEST 1 VIEW COMPARISON:  May 28, 2020 FINDINGS: Endotracheal tube and nasogastric tube have been removed. Central catheter tip is in the superior vena cava. No pneumothorax. There is mild atelectatic change in the bases and right mid lung regions, stable. No new opacity evident. Heart is prominent, stable, with pulmonary vascularity normal. No adenopathy. There is aortic atherosclerosis. No bone lesions. IMPRESSION: No pneumothorax. Stable cardiac prominence. Areas of atelectatic change, stable. No new opacity. Aortic Atherosclerosis (ICD10-I70.0). Electronically Signed   By: Lowella Grip III M.D.   On: 05/30/2020 07:57    Cardiac Studies:  Assessment/Plan:  Acute ischemic CVA in the setting of hypotension cannot rule out cardioembolic source in view of paroxysmal A. Fib Status post hypotensive shock secondary to large anterior abdominal wall hematoma. Status post acute respiratory failure patient off ventilator Marked leukocytosis improved Status post acute non-ST elevation MI status post left cardiac catheterization/PTCA stenting to mid LAD Status post paroxysmal atrial fibrillation back in normal sinus rhythm. Acute blood loss anemia secondary to large anterior abdominal wall hematoma Acute kidney injury secondary to hypotension/hypoperfusion improved Hypertension Diabetes mellitus History of TIA/CVA in the past Morbid obesity Obstructive sleep apnea History of gouty arthritis Plan Continue present management Increase ambulation as tolerated Check labs in a.m.  LOS: 6 days    Charolette Forward 05/30/2020, 11:23 AM

## 2020-05-30 NOTE — Progress Notes (Signed)
Pt with strong cough, constant attempt to suction mouth causing gaging on the [place]. Pt reminded to only suction mouth after coughing something up. Patient verbalized understanding then placed the [place]auer back into her mouth. Marland Kitchen

## 2020-05-30 NOTE — H&P (Addendum)
Physical Medicine and Rehabilitation Admission H&P    Chief Complaint  Patient presents with  . Chest Pain  : HPI: Sherry Fitzpatrick is a 69 year old right-handed female with history of hypertension, systolic congestive heart failure, TIA without residual weakness, diabetes mellitus, hyperlipidemia, morbid obesity with BMI of 44.24, CAD with stenting maintained on aspirin and Plavix.  History taken from chart review and patient.  Patient lives with her children.  Reportedly independent prior to admission.  1 level home 2 steps to entry.  Daughter does most of the housework and heavy meal preparation.  She presented on 05/24/2020 with chest pain and dyspnea, radiating to the left shoulder and very little relief with nitroglycerin.  EKG normal sinus rhythm with ST-T wave changes in inferior lateral leads noted to have minimally elevated high-sensitivity troponin.  Started on IV heparin and nitro.  Most recent echocardiogram with ejection fraction of 65%.  She underwent cardiac catheterization with stenting per cardiology services.  Hospital course further complicated by hypotension.  On the morning of 05/27/2020 episode of emesis severe lower abdominal pain and right groin pain relieved with tramadol.  Later in the day episode of acute altered mental status dysarthria question seizure as well as ongoing hypotension requiring dopamine.  MRI showed patchy small volume acute ischemic infarct in the right splenium and right cerebral white matter with additional punctate acute ischemic left cerebellar infarct.  No associated hemorrhage.  MRA negative of the head.  MRA of the neck approximately 30 to 40% stenosis about the origins of both internal carotid arteries left slightly worse than right.  EEG negative for seizures.  Vascular ultrasound of groin showed no evidence of pseudoaneurysm or DVT.  Patient currently remained on aspirin Plavix for CVA prophylaxis.  She was placed on Rocephin for CAP 8/16/ 2021x2  doses.  Tolerating a mechanical soft diet with thin liquids.  Therapy evaluations completed and patient was admitted for a comprehensive rehab program.  Please see preadmission assessment from earlier today as well.  Review of Systems  Constitutional: Negative for chills, fever and malaise/fatigue.  HENT: Negative for hearing loss.   Eyes: Negative for blurred vision and double vision.  Respiratory: Positive for shortness of breath. Negative for cough and wheezing.   Gastrointestinal: Positive for abdominal pain.  Genitourinary: Negative for dysuria, flank pain and urgency.  Musculoskeletal: Positive for myalgias.  Skin: Negative for rash.  Neurological: Positive for dizziness, speech change and headaches. Negative for weakness.  All other systems reviewed and are negative.  Past Medical History:  Diagnosis Date  . Alterations of sensations, late effect of cerebrovascular disease(438.6)   . Arthritis    "all over"  . Backache, unspecified   . Body mass index 40.0-44.9, adult ([place])   . Carpal tunnel syndrome   . Dizziness and giddiness   . Esophageal reflux   . Generalized pain   . Headache    "had alot til I had pituitary tumor removed"  . Heart murmur   . Other abnormal glucose   . Other and unspecified hyperlipidemia   . Other malaise and fatigue   . Other postprocedural status(V45.89)   . Other symptoms involving cardiovascular system   . Sleep apnea    "did test; they wanted me to get a CPAP but I never did get it" (04/14/2015) Does use inhaler at night (01 2021)  . Stroke ([place]) 06/2003; 02/2010   "minor; minor" ; denies residual on 04/14/2015  . Unspecified disorder of the pituitary gland and  its hypothalamic control   . Unspecified essential hypertension   . Unspecified visual disturbance    Past Surgical History:  Procedure Laterality Date  . CARPAL TUNNEL RELEASE Left ~ 2014  . CORONARY STENT INTERVENTION N/A 05/24/2020   Procedure: CORONARY STENT INTERVENTION;   Surgeon: Jettie Booze, MD;  Location: [facility];  Service: Cardiovascular;  Laterality: N/A;  . JOINT REPLACEMENT    . LEFT HEART CATH AND CORONARY ANGIOGRAPHY N/A 05/24/2020   Procedure: LEFT HEART CATH AND CORONARY ANGIOGRAPHY;  Surgeon: Charolette Forward, MD;  Location: [facility];  Service: Cardiovascular;  Laterality: N/A;  . TOTAL HIP ARTHROPLASTY Right 11/06/2010  . TRANSPHENOIDAL / TRANSNASAL HYPOPHYSECTOMY / RESECTION PITUITARY TUMOR  06/13/2012  . TUBAL LIGATION  1974   Family History  Problem Relation Age of Onset  . Heart failure Mother   . Hypertension Mother   . Diabetes Father   . Hypertension Sister   . Heart failure Sister   . Hypertension Daughter    Social History:  reports that she has never smoked. She has never used smokeless tobacco. She reports current alcohol use. She reports that she does not use drugs. Allergies:  Allergies  Allergen Reactions  . Morphine And Related Hives and Nausea And Vomiting  . Penicillins Hives    Has patient had a PCN reaction causing immediate rash, facial/tongue/throat swelling, SOB or lightheadedness with hypotension: Yes Has patient had a PCN reaction causing severe rash involving mucus membranes or skin necrosis: No Has patient had a PCN reaction that required hospitalization: No Has patient had a PCN reaction occurring within the last 10 years: No If all of the above answers are "NO", then may proceed with Cephalosporin use.  Marland Kitchen Percocet [Oxycodone-Acetaminophen] Nausea And Vomiting  . Ace Inhibitors Nausea And Vomiting and Cough  . Iodinated Diagnostic Agents Nausea And Vomiting       . Iodine Itching  . Etodolac Nausea And Vomiting  . Tramadol Nausea And Vomiting  . Vicodin [Hydrocodone-Acetaminophen] Nausea And Vomiting   Medications Prior to Admission  Medication Sig Dispense Refill  . acetaminophen (TYLENOL) 500 MG tablet Take 1,000 mg by mouth every 6 (six) hours as needed for headache (pain).      Marland Kitchen albuterol (PROVENTIL HFA;VENTOLIN HFA) 108 (90 Base) MCG/ACT inhaler Inhale 2 puffs into the lungs every 6 (six) hours as needed for wheezing. (Patient taking differently: Inhale 2 puffs into the lungs every 6 (six) hours as needed for wheezing or shortness of breath. ) 1 Inhaler 3  . amLODipine (NORVASC) 10 MG tablet Take 1 tablet (10 mg total) by mouth daily. (Patient taking differently: Take 10 mg by mouth daily at 2 PM. ) 30 tablet 1  . aspirin 81 MG chewable tablet Chew 81 mg by mouth daily.    Marland Kitchen atorvastatin (LIPITOR) 80 MG tablet Take 1 tablet (80 mg total) by mouth daily. (Patient taking differently: Take 80 mg by mouth every Thursday. ) 30 tablet 3  . budesonide-formoterol (SYMBICORT) 160-4.5 MCG/ACT inhaler Inhale 2 puffs into the lungs 2 (two) times daily. 1 Inhaler 1  . carvedilol (COREG) 6.25 MG tablet Take 6.25 mg by mouth 2 (two) times daily.    . cloNIDine (CATAPRES) 0.3 MG tablet Take 1 tablet (0.3 mg total) by mouth 2 (two) times daily. (Patient taking differently: Take 0.3 mg by mouth 2 (two) times daily. at 2pm and after supper) 60 tablet 1  . clopidogrel (PLAVIX) 75 MG tablet Take 1 tablet (75  mg total) by mouth daily. 30 tablet 3  . furosemide (LASIX) 40 MG tablet Take 1 tablet (40 mg total) by mouth daily at 2 PM. (Patient taking differently: Take 40 mg by mouth daily. )    . hydrALAZINE (APRESOLINE) 25 MG tablet Take 1 tablet (25 mg total) by mouth every 8 (eight) hours. 90 tablet 0  . ipratropium-albuterol (DUONEB) 0.5-2.5 (3) MG/3ML SOLN Take 3 mLs by nebulization every 4 (four) hours as needed. (Patient taking differently: Take 3 mLs by nebulization every 4 (four) hours as needed (for breathing). ) 360 mL 0  . potassium chloride (KLOR-CON) 10 MEQ tablet Take 2 tablets (20 mEq total) by mouth daily. (Patient taking differently: Take 20 mEq by mouth daily at 2 PM. ) 30 tablet 1  . [DISCONTINUED] metFORMIN (GLUCOPHAGE) 500 MG tablet Take 1 tablet (500 mg total) by mouth 2  (two) times daily with a meal. 30 tablet 1  . [DISCONTINUED] valsartan (DIOVAN) 320 MG tablet Take 320 mg by mouth daily.     Marland Kitchen guaiFENesin (MUCINEX) 600 MG 12 hr tablet Take 2 tablets (1,200 mg total) by mouth 2 (two) times daily. (Patient not taking: Reported on 03/27/2020) 14 tablet 0    Drug Regimen Review Drug regimen was reviewed and remains appropriate no significant issues identified  Home: Home Living Family/patient expects to be discharged to:: Private residence Living Arrangements: Children Available Help at Discharge: Family, Available 24 hours/day Type of Home: House Home Access: Stairs to enter Technical brewer of Steps: 2 Home Layout: One level Bathroom Shower/Tub: Chiropodist: Standard Home Equipment: Environmental consultant - 2 wheels, Bedside commode, Tub bench  Lives With: Daughter   Functional History: Prior Function Level of Independence: Independent Comments: daughter does most housework and heavy meal prep  Functional Status:  Mobility: Bed Mobility Overal bed mobility: Needs Assistance Bed Mobility: Supine to Sit Supine to sit: Max assist, +2 for physical assistance, +2 for safety/equipment General bed mobility comments: increased time to initiate with assist for trunk and to fully scoot LEs over EOB/hips towards EOB. semi roll to left with cues throughout. Dizziness with transition to sitting with right beating nystagmus noted grossly 1 min Transfers Overall transfer level: Needs assistance Equipment used: 2 person hand held assist Transfers: Sit to/from Stand, Stand Pivot Transfers Sit to Stand: Mod assist, +2 physical assistance, +2 safety/equipment Stand pivot transfers: Mod assist, +2 physical assistance, +2 safety/equipment General transfer comment: assist to rise and stabilize in standing, +2 via face to face method. performed x2 sit<>stand from EOB, stand pivot EOB>BSC>recliner with steadying assist throughout, cues for sequencing steps  and multimodal cues to reach back for recliner armrest  Ambulation/Gait General Gait Details: not yet ready to attempt    ADL: ADL Overall ADL's : Needs assistance/impaired Eating/Feeding: NPO Grooming: Minimal assistance, Sitting Upper Body Bathing: Minimal assistance, Sitting Lower Body Bathing: Maximal assistance, +2 for physical assistance, +2 for safety/equipment, Sit to/from stand Upper Body Dressing : Sitting, Moderate assistance Lower Body Dressing: Total assistance, +2 for physical assistance, +2 for safety/equipment, Sit to/from stand Toilet Transfer: Moderate assistance, +2 for physical assistance, +2 for safety/equipment, Stand-pivot, BSC Toileting- Clothing Manipulation and Hygiene: Total assistance, +2 for physical assistance, +2 for safety/equipment, Sit to/from stand Toileting - Clothing Manipulation Details (indicate cue type and reason): totalA for posterior pericare after BM Functional mobility during ADLs: Maximal assistance, +2 for physical assistance, +2 for safety/equipment (HHA, maxA+2 bed, modA+2 transfer)  Cognition: Cognition Overall Cognitive Status: Impaired/Different from baseline Arousal/Alertness:  Awake/alert Orientation Level: Oriented X4 Attention: Focused, Sustained, Selective Focused Attention: Appears intact Sustained Attention: Appears intact Selective Attention: Appears intact Memory: Impaired Memory Impairment: Retrieval deficit, Decreased short term memory Decreased Short Term Memory: Verbal basic, Functional basic Awareness: Impaired Awareness Impairment: Intellectual impairment, Emergent impairment Problem Solving: Appears intact Safety/Judgment: Impaired Cognition Arousal/Alertness: Awake/alert Behavior During Therapy: WFL for tasks assessed/performed Overall Cognitive Status: Impaired/Different from baseline Area of Impairment: Orientation, Attention, Memory, Following commands, Safety/judgement, Awareness, Problem  solving Orientation Level: Disoriented to, Time Current Attention Level: Selective Memory: Decreased recall of precautions, Decreased short-term memory Following Commands: Follows one step commands with increased time Safety/Judgement: Decreased awareness of safety, Decreased awareness of deficits Awareness: Emergent Problem Solving: Requires verbal cues, Requires tactile cues, Difficulty sequencing, Decreased initiation, Slow processing General Comments: confusion noted at times and increased time to initiate tasks; intermittent cues for safety including instructing pt not to stand until therapists ready to do so   Physical Exam: Blood pressure (!) 166/62, pulse 78, temperature 99.7 F (37.6 C), temperature source Oral, resp. rate 18, height 5\' 4"  (1.626 m), weight 116.8 kg, SpO2 97 %. Physical Exam Vitals reviewed.  Constitutional:      Appearance: She is obese.  HENT:     Head: Normocephalic and atraumatic.     Right Ear: External ear normal.     Left Ear: External ear normal.     Nose: Nose normal.  Eyes:     General:        Right eye: No discharge.        Left eye: No discharge.     Extraocular Movements: Extraocular movements intact.  Cardiovascular:     Rate and Rhythm: Normal rate and regular rhythm.  Pulmonary:     Effort: Pulmonary effort is normal. No respiratory distress.     Breath sounds: No stridor.  Abdominal:     General: Abdomen is flat.     Tenderness: There is abdominal tenderness.  Musculoskeletal:     Cervical back: Normal range of motion and neck supple.     Comments: No edema or tenderness in extremities  Skin:    General: Skin is warm and dry.  Neurological:     Mental Status: She is alert.     Comments: Makes eye contact with examiner.   Follows commands.   Provides her name and age. Dysarthria Motor: 4+/5 throughout  Psychiatric:        Mood and Affect: Mood normal.        Behavior: Behavior normal.     Results for orders placed or  performed during the hospital encounter of 05/24/20 (from the past 48 hour(s))  Glucose, capillary     Status: None   Collection Time: 05/30/20  4:25 PM  Result Value Ref Range   Glucose-Capillary 78 70 - 99 mg/dL    Comment: Glucose reference range applies only to samples taken after fasting for at least 8 hours.  Glucose, capillary     Status: Abnormal   Collection Time: 05/30/20  9:24 PM  Result Value Ref Range   Glucose-Capillary 133 (H) 70 - 99 mg/dL    Comment: Glucose reference range applies only to samples taken after fasting for at least 8 hours.  Basic metabolic panel     Status: Abnormal   Collection Time: 05/31/20  3:35 AM  Result Value Ref Range   Sodium 142 135 - 145 mmol/L   Potassium 3.5 3.5 - 5.1 mmol/L   Chloride 108 98 - 111  mmol/L   CO2 24 22 - 32 mmol/L   Glucose, Bld 121 (H) 70 - 99 mg/dL    Comment: Glucose reference range applies only to samples taken after fasting for at least 8 hours.   BUN 22 8 - 23 mg/dL   Creatinine, Ser 1.14 (H) 0.44 - 1.00 mg/dL   Calcium 8.3 (L) 8.9 - 10.3 mg/dL   GFR calc non Af Amer 49 (L) >60 mL/min   GFR calc Af Amer 56 (L) >60 mL/min   Anion gap 10 5 - 15    Comment: Performed at [facility] [address], [place], [place] [address]  CBC     Status: Abnormal   Collection Time: 05/31/20  3:35 AM  Result Value Ref Range   WBC 16.3 (H) 4.0 - 10.5 K/uL   RBC 3.16 (L) 3.87 - 5.11 MIL/uL   Hemoglobin 9.3 (L) 12.0 - 15.0 g/dL   HCT 28.4 (L) 36 - 46 %   MCV 89.9 80.0 - 100.0 fL   MCH 29.4 26.0 - 34.0 pg   MCHC 32.7 30.0 - 36.0 g/dL   RDW 16.8 (H) 11.5 - 15.5 %   Platelets 246 150 - 400 K/uL   nRBC 0.3 (H) 0.0 - 0.2 %    Comment: Performed at [facility], [place] [address], [place], [place] [address]  Glucose, capillary     Status: Abnormal   Collection Time: 05/31/20  8:15 AM  Result Value Ref Range   Glucose-Capillary 113 (H) 70 - 99 mg/dL    Comment: Glucose reference range applies only to samples taken after  fasting for at least 8 hours.  Troponin I (High Sensitivity)     Status: Abnormal   Collection Time: 05/31/20  9:20 AM  Result Value Ref Range   Troponin I (High Sensitivity) 80 (H) <18 ng/L    Comment: (NOTE) Elevated high sensitivity troponin I (hsTnI) values and significant  changes across serial measurements may suggest ACS but many other  chronic and acute conditions are known to elevate hsTnI results.  Refer to the "Links" section for chest pain algorithms and additional  guidance. Performed at [facility], [place] [address] [place], [place] [address]   Glucose, capillary     Status: Abnormal   Collection Time: 05/31/20 12:05 PM  Result Value Ref Range   Glucose-Capillary 140 (H) 70 - 99 mg/dL    Comment: Glucose reference range applies only to samples taken after fasting for at least 8 hours.  Glucose, capillary     Status: Abnormal   Collection Time: 05/31/20  4:42 PM  Result Value Ref Range   Glucose-Capillary 128 (H) 70 - 99 mg/dL    Comment: Glucose reference range applies only to samples taken after fasting for at least 8 hours.  Glucose, capillary     Status: Abnormal   Collection Time: 05/31/20  8:49 PM  Result Value Ref Range   Glucose-Capillary 126 (H) 70 - 99 mg/dL    Comment: Glucose reference range applies only to samples taken after fasting for at least 8 hours.  Glucose, capillary     Status: Abnormal   Collection Time: 06/01/20  7:57 AM  Result Value Ref Range   Glucose-Capillary 124 (H) 70 - 99 mg/dL    Comment: Glucose reference range applies only to samples taken after fasting for at least 8 hours.  Glucose, capillary     Status: Abnormal   Collection Time: 06/01/20 11:32 AM  Result Value Ref Range   Glucose-Capillary 105 (H) 70 - 99 mg/dL    Comment: Glucose reference range applies only to samples taken after fasting for at least 8 hours.  Glucose, capillary     Status: Abnormal   Collection Time: 06/01/20 12:04 PM  Result Value Ref Range    Glucose-Capillary 100 (H) 70 - 99 mg/dL    Comment: Glucose reference range applies only to samples taken after fasting for at least 8 hours.   No results found.     Medical Problem List and Plan: 1.  Altered mental status with dysarthria secondary to left cerebellar infarction after cardiac catheterization/PAF  -patient may shower  -ELOS/Goals: 12-15 days/Min A  Admit to CIR 2.  Antithrombotics: -DVT/anticoagulation: SCDs  -antiplatelet therapy: Aspirin 81 mg daily and Plavix 75 mg daily 3. Pain Management: Tylenol as needed 4. Mood: Provide emotional support  -antipsychotic agents: N/A 5. Neuropsych: This patient is capable of making decisions on her own behalf. 6. Skin/Wound Care: Routine skin checks 7. Fluids/Electrolytes/Nutrition: Routine in and outs. 8.  Hypotension with atrial fibrillation. Low dose Norvasc 2.5 mg daily. Continue amiodarone 100 mg daily.  Cardiac rate controlled  Monitor with increased exertion. 9.  Diabetes mellitus with hyperglycemia.  Hemoglobin A1c 7.3.  NovoLog 4 units every 4 hours.  Check blood sugars as directed.  Monitor with increased mobility. 10.  Systolic congestive heart failure.Imdur 30 mg daily.  Monitor for any signs of fluid overload  Daily weights 11.  Morbid obesity.  BMI 44.24.  Dietary follow-up.  Encourage weight loss 12.  Hyperlipidemia.  Lipitor 13.  CAP.  IV Rocephin completed.  Check oxygen saturations every shift. 14.  Post stroke dysphagia: Advance diet as tolerated  Cathlyn Parsons, PA-C 06/01/2020   I have personally performed a face to face diagnostic evaluation, including, but not limited to relevant history and physical exam findings, of this patient and developed relevant assessment and plan.  Additionally, I have reviewed and concur with the physician assistant's documentation above.  Delice Lesch, MD, ABPMR

## 2020-05-31 LAB — TROPONIN I (HIGH SENSITIVITY): Troponin I (High Sensitivity): 80 ng/L — ABNORMAL HIGH (ref <18)

## 2020-05-31 LAB — CBC
HCT: 28.4 % — ABNORMAL LOW (ref 36.0–46.0)
Hemoglobin: 9.3 g/dL — ABNORMAL LOW (ref 12.0–15.0)
MCH: 29.4 pg (ref 26.0–34.0)
MCHC: 32.7 g/dL (ref 30.0–36.0)
MCV: 89.9 fL (ref 80.0–100.0)
Platelets: 246 10*3/uL (ref 150–400)
RBC: 3.16 MIL/uL — ABNORMAL LOW (ref 3.87–5.11)
RDW: 16.8 % — ABNORMAL HIGH (ref 11.5–15.5)
WBC: 16.3 10*3/uL — ABNORMAL HIGH (ref 4.0–10.5)
nRBC: 0.3 % — ABNORMAL HIGH (ref 0.0–0.2)

## 2020-05-31 LAB — CULTURE, RESPIRATORY W GRAM STAIN: Culture: NORMAL

## 2020-05-31 LAB — BASIC METABOLIC PANEL
Anion gap: 10 (ref 5–15)
BUN: 22 mg/dL (ref 8–23)
CO2: 24 mmol/L (ref 22–32)
Calcium: 8.3 mg/dL — ABNORMAL LOW (ref 8.9–10.3)
Chloride: 108 mmol/L (ref 98–111)
Creatinine, Ser: 1.14 mg/dL — ABNORMAL HIGH (ref 0.44–1.00)
GFR calc Af Amer: 56 mL/min — ABNORMAL LOW (ref >60)
GFR calc non Af Amer: 49 mL/min — ABNORMAL LOW (ref >60)
Glucose, Bld: 121 mg/dL — ABNORMAL HIGH (ref 70–99)
Potassium: 3.5 mmol/L (ref 3.5–5.1)
Sodium: 142 mmol/L (ref 135–145)

## 2020-05-31 LAB — GLUCOSE, CAPILLARY
Glucose-Capillary: 113 mg/dL — ABNORMAL HIGH (ref 70–99)
Glucose-Capillary: 140 mg/dL — ABNORMAL HIGH (ref 70–99)
Glucose-Capillary: 128 mg/dL — ABNORMAL HIGH (ref 70–99)
Glucose-Capillary: 126 mg/dL — ABNORMAL HIGH (ref 70–99)

## 2020-05-31 MED ORDER — PANTOPRAZOLE SODIUM 40 MG PO TBEC
40.0000 mg | DELAYED_RELEASE_TABLET | Freq: Every day | ORAL | Status: DC
  Administered 2020-05-31: 40 mg via ORAL
  Filled 2020-05-31: qty 1

## 2020-05-31 MED ORDER — ISOSORBIDE MONONITRATE ER 30 MG PO TB24
30.0000 mg | ORAL_TABLET | Freq: Every day | ORAL | Status: DC
  Administered 2020-05-31 – 2020-06-01 (×2): 30 mg via ORAL
  Filled 2020-05-31 (×2): qty 1

## 2020-05-31 MED ORDER — AMLODIPINE BESYLATE 2.5 MG PO TABS
2.5000 mg | ORAL_TABLET | Freq: Every day | ORAL | Status: DC
  Administered 2020-05-31 – 2020-06-01 (×2): 2.5 mg via ORAL
  Filled 2020-05-31 (×2): qty 1

## 2020-05-31 NOTE — Consult Note (Signed)
   [facility] CM Inpatient Consult   05/31/2020  Sherry Fitzpatrick 1951/01/31 [id number]  [facility] [ACO] Patient: [facility] Medicare   Patient screened for high risk score for unplanned readmission score and for hospitalizations to check if potential [facility] service needs.  Review of patient's medical record reveals patient is pursuing inpatient rehabilitation currently. Primary Care Provider is Glenis Smoker, MD this provider is an [place] [facility] listed to provide the transition of care [TOC] for post hospital follow up.  Plan:  Follow up for progress and transition with inpatient [facility] team  Continue to follow progress and disposition to assess for post hospital care management needs.    Please place a [facility] Care Management consult as appropriate and for questions contact:   Natividad Brood, RN BSN [facility] Liaison  [phone] business mobile phone Toll free office [phone]  Fax number: [phone] [email or web address]

## 2020-05-31 NOTE — Progress Notes (Signed)
Subjective:  Patient complained of vague localized retrosternal chest pain earlier this morning improved after taking Maalox EKG done showed no new acute ischemic changes high-sensitivity troponin I trending down minimally elevated.  Right lower abdominal wall pain persists.  Hemoglobin is stable.  Objective:  Vital Signs in the last 24 hours: Temp:  [98.6 F (37 C)-99.5 F (37.5 C)] 98.6 F (37 C) (08/17 1208) Pulse Rate:  [62-80] 69 (08/17 1208) Resp:  [18-29] 19 (08/17 1208) BP: (124-207)/(69-93) 147/69 (08/17 1208) SpO2:  [95 %-100 %] 100 % (08/17 1208)  Intake/Output from previous day: 08/16 0701 - 08/17 0700 In: 580 [P.O.:480; IV Piggyback:100] Out: -  Intake/Output from this shift: No intake/output data recorded.  Physical Exam: Neck: no adenopathy, no carotid bruit, no JVD and supple, symmetrical, trachea midline Lungs: clear to auscultation bilaterally Heart: regular rate and rhythm, S1, S2 normal and Soft systolic murmur noted Abdomen: soft, non-tender; bowel sounds normal; no masses,  no organomegaly and Right lower abdominal wall tenderness noted no ecchymosis right groin stable Extremities: extremities normal, atraumatic, no cyanosis or edema  Lab Results: Recent Labs    05/30/20 0501 05/31/20 0335  WBC 15.4* 16.3*  HGB 9.0* 9.3*  PLT 208 246   Recent Labs    05/30/20 0501 05/31/20 0335  NA 143 142  K 3.7 3.5  CL 108 108  CO2 25 24  GLUCOSE 129* 121*  BUN 28* 22  CREATININE 1.28* 1.14*   No results for input(s): TROPONINI in the last 72 hours.  Invalid input(s): CK, MB Hepatic Function Panel No results for input(s): PROT, ALBUMIN, AST, ALT, ALKPHOS, BILITOT, BILIDIR, IBILI in the last 72 hours. No results for input(s): CHOL in the last 72 hours. No results for input(s): PROTIME in the last 72 hours.  Imaging: Imaging results have been reviewed and DG Chest Port 1V same Day  Result Date: 05/30/2020 CLINICAL DATA:  Status post extubation.   Shortness of breath. EXAM: PORTABLE CHEST 1 VIEW COMPARISON:  May 28, 2020 FINDINGS: Endotracheal tube and nasogastric tube have been removed. Central catheter tip is in the superior vena cava. No pneumothorax. There is mild atelectatic change in the bases and right mid lung regions, stable. No new opacity evident. Heart is prominent, stable, with pulmonary vascularity normal. No adenopathy. There is aortic atherosclerosis. No bone lesions. IMPRESSION: No pneumothorax. Stable cardiac prominence. Areas of atelectatic change, stable. No new opacity. Aortic Atherosclerosis (ICD10-I70.0). Electronically Signed   By: Lowella Grip III M.D.   On: 05/30/2020 07:57    Cardiac Studies:  Assessment/Plan:  Acute ischemic CVAin the setting of hypotension cannot rule out cardioembolic source in view of paroxysmal A. Fib Status post hypotensive shock secondary to large anterior abdominal wall hematoma. Status post acute respiratory failure patient off ventilator Marked leukocytosis improved Status post acute non-ST elevation MI status post left cardiac catheterization/PTCA stenting to mid LAD GERD Status post paroxysmal atrial fibrillation back in normal sinus rhythm. Acute blood loss anemia secondary to large anterior abdominal wall hematoma Acute kidney injury secondary to hypotension/hypoperfusionimproved Hypertension Diabetes mellitus History of TIA/CVA in the past Morbid obesity Obstructive sleep apnea History of gouty arthritis Plan Continue present management Increase ambulation as tolerated Okay to transfer to rehab.  Patient stable from cardiac point of view    LOS: 7 days    Charolette Forward 05/31/2020, 12:52 PM

## 2020-05-31 NOTE — Progress Notes (Addendum)
Patient complained of lower right quadrant abdomen pain and requested pain medication.  I told her she had Tylenol for mild pain and Tramadol for more severe pain.  I asked the patient which one she wanted and the patient requested the Tramadol.  The patient is alert and oriented x4 so I honored the patient's request and administered the tramadol as ordered.  Right after I administered the tramadol to the patient, the daughter happened to call to check up on her mother.  I explained that she was doing okay but I had just given Tramadol for pain.  The patient's daughter was extremely upset and said that her sister made sure Tramadol was removed from her medication list.  She said Tramadol makes her sick and wanted to speak to the patient.  I took my phone to the patient's room so she could speak to her mom.  The patient told her daughter that she requested the tramadol and she took it.  The daughter got the patient upset but the patient continued to talk to her.  The daughter told the patient to eat something so she would have something on her stomach and this Probation officer provided the patient with a sandwich and applesauce which the patient ate.    The patient agreed to call if she began feeling bad and had the call bell.  We assured her that we would check on her frequently.    0200- Patient's other daughter called to check on her mother.  Updated daughter and assured her that patient has had no reactions to the Tramadol and that the medication has been removed from her profile.

## 2020-05-31 NOTE — Progress Notes (Signed)
Inpatient Rehab Admissions Coordinator:   I met with Pt. At bedside and she affirmed that she would like to come to CIR. She is medically stable and ready for discharge, we are just awaiting insurance authorization. Will continue to follow and pursue admission pending insurance authorization and bed availability.   Clemens Catholic, [place], [place] Admissions Coordinator  [phone] ([place]) [phone] (office)

## 2020-05-31 NOTE — Progress Notes (Signed)
Dr. Terrence Dupont called and updated . New order received for imdur and Amlodipine and Troponin I . Maalox 30 cc given for acid reflux. Will continue to monitor pt. Also Pt daughter Candace request to speak with MD. Her number given to Dr. Terrence Dupont. Dr. Terrence Dupont to call.

## 2020-06-01 ENCOUNTER — Encounter (HOSPITAL_COMMUNITY): Payer: Self-pay | Admitting: Physical Medicine & Rehabilitation

## 2020-06-01 ENCOUNTER — Inpatient Hospital Stay (HOSPITAL_COMMUNITY)
Admission: RE | Admit: 2020-06-01 | Discharge: 2020-06-10 | DRG: 057 | Disposition: A | Discharge status: Home Health | Payer: Medicare HMO | Source: Intra-hospital | Attending: Physical Medicine & Rehabilitation | Admitting: Physical Medicine & Rehabilitation

## 2020-06-01 ENCOUNTER — Other Ambulatory Visit: Payer: Self-pay

## 2020-06-01 DIAGNOSIS — I69391 Dysphagia following cerebral infarction: Principal | ICD-10-CM

## 2020-06-01 DIAGNOSIS — I63542 Cerebral infarction due to unspecified occlusion or stenosis of left cerebellar artery: Secondary | ICD-10-CM | POA: Diagnosis not present

## 2020-06-01 DIAGNOSIS — Z794 Long term (current) use of insulin: Secondary | ICD-10-CM

## 2020-06-01 DIAGNOSIS — I214 Non-ST elevation (NSTEMI) myocardial infarction: Secondary | ICD-10-CM | POA: Diagnosis not present

## 2020-06-01 DIAGNOSIS — I5022 Chronic systolic (congestive) heart failure: Secondary | ICD-10-CM | POA: Diagnosis present

## 2020-06-01 DIAGNOSIS — E8809 Other disorders of plasma-protein metabolism, not elsewhere classified: Secondary | ICD-10-CM | POA: Diagnosis not present

## 2020-06-01 DIAGNOSIS — Z9861 Coronary angioplasty status: Secondary | ICD-10-CM | POA: Diagnosis not present

## 2020-06-01 DIAGNOSIS — D72829 Elevated white blood cell count, unspecified: Secondary | ICD-10-CM

## 2020-06-01 DIAGNOSIS — M96841 Postprocedural hematoma of a musculoskeletal structure following other procedure: Secondary | ICD-10-CM | POA: Diagnosis not present

## 2020-06-01 DIAGNOSIS — I6350 Cerebral infarction due to unspecified occlusion or stenosis of unspecified cerebral artery: Secondary | ICD-10-CM | POA: Diagnosis not present

## 2020-06-01 DIAGNOSIS — Z955 Presence of coronary angioplasty implant and graft: Secondary | ICD-10-CM

## 2020-06-01 DIAGNOSIS — Z96641 Presence of right artificial hip joint: Secondary | ICD-10-CM | POA: Diagnosis present

## 2020-06-01 DIAGNOSIS — I4891 Unspecified atrial fibrillation: Secondary | ICD-10-CM | POA: Diagnosis not present

## 2020-06-01 DIAGNOSIS — Z7902 Long term (current) use of antithrombotics/antiplatelets: Secondary | ICD-10-CM

## 2020-06-01 DIAGNOSIS — E1165 Type 2 diabetes mellitus with hyperglycemia: Secondary | ICD-10-CM

## 2020-06-01 DIAGNOSIS — G4733 Obstructive sleep apnea (adult) (pediatric): Secondary | ICD-10-CM | POA: Diagnosis present

## 2020-06-01 DIAGNOSIS — I10 Essential (primary) hypertension: Secondary | ICD-10-CM

## 2020-06-01 DIAGNOSIS — I5021 Acute systolic (congestive) heart failure: Secondary | ICD-10-CM | POA: Diagnosis not present

## 2020-06-01 DIAGNOSIS — I639 Cerebral infarction, unspecified: Secondary | ICD-10-CM

## 2020-06-01 DIAGNOSIS — K5730 Diverticulosis of large intestine without perforation or abscess without bleeding: Secondary | ICD-10-CM | POA: Diagnosis not present

## 2020-06-01 DIAGNOSIS — Z79899 Other long term (current) drug therapy: Secondary | ICD-10-CM | POA: Diagnosis not present

## 2020-06-01 DIAGNOSIS — I249 Acute ischemic heart disease, unspecified: Secondary | ICD-10-CM | POA: Diagnosis not present

## 2020-06-01 DIAGNOSIS — Z7982 Long term (current) use of aspirin: Secondary | ICD-10-CM | POA: Diagnosis not present

## 2020-06-01 DIAGNOSIS — Y840 Cardiac catheterization as the cause of abnormal reaction of the patient, or of later complication, without mention of misadventure at the time of the procedure: Secondary | ICD-10-CM | POA: Diagnosis present

## 2020-06-01 DIAGNOSIS — Z6841 Body Mass Index (BMI) 40.0 and over, adult: Secondary | ICD-10-CM

## 2020-06-01 DIAGNOSIS — E785 Hyperlipidemia, unspecified: Secondary | ICD-10-CM | POA: Diagnosis present

## 2020-06-01 DIAGNOSIS — I6330 Cerebral infarction due to thrombosis of unspecified cerebral artery: Secondary | ICD-10-CM | POA: Diagnosis not present

## 2020-06-01 DIAGNOSIS — I110 Hypertensive heart disease with heart failure: Secondary | ICD-10-CM | POA: Diagnosis present

## 2020-06-01 DIAGNOSIS — E46 Unspecified protein-calorie malnutrition: Secondary | ICD-10-CM | POA: Diagnosis not present

## 2020-06-01 DIAGNOSIS — R1031 Right lower quadrant pain: Secondary | ICD-10-CM | POA: Diagnosis not present

## 2020-06-01 DIAGNOSIS — I2510 Atherosclerotic heart disease of native coronary artery without angina pectoris: Secondary | ICD-10-CM | POA: Diagnosis not present

## 2020-06-01 DIAGNOSIS — E876 Hypokalemia: Secondary | ICD-10-CM | POA: Diagnosis not present

## 2020-06-01 DIAGNOSIS — Z7984 Long term (current) use of oral hypoglycemic drugs: Secondary | ICD-10-CM | POA: Diagnosis not present

## 2020-06-01 DIAGNOSIS — M7981 Nontraumatic hematoma of soft tissue: Secondary | ICD-10-CM | POA: Diagnosis not present

## 2020-06-01 DIAGNOSIS — I480 Paroxysmal atrial fibrillation: Secondary | ICD-10-CM | POA: Diagnosis not present

## 2020-06-01 DIAGNOSIS — R109 Unspecified abdominal pain: Secondary | ICD-10-CM

## 2020-06-01 DIAGNOSIS — R0989 Other specified symptoms and signs involving the circulatory and respiratory systems: Secondary | ICD-10-CM | POA: Diagnosis not present

## 2020-06-01 DIAGNOSIS — E119 Type 2 diabetes mellitus without complications: Secondary | ICD-10-CM | POA: Diagnosis not present

## 2020-06-01 DIAGNOSIS — I5020 Unspecified systolic (congestive) heart failure: Secondary | ICD-10-CM

## 2020-06-01 DIAGNOSIS — D62 Acute posthemorrhagic anemia: Secondary | ICD-10-CM | POA: Diagnosis not present

## 2020-06-01 DIAGNOSIS — S300XXA Contusion of lower back and pelvis, initial encounter: Secondary | ICD-10-CM | POA: Diagnosis not present

## 2020-06-01 DIAGNOSIS — I700 Atherosclerosis of aorta: Secondary | ICD-10-CM | POA: Diagnosis not present

## 2020-06-01 DIAGNOSIS — S301XXD Contusion of abdominal wall, subsequent encounter: Secondary | ICD-10-CM | POA: Diagnosis not present

## 2020-06-01 LAB — BASIC METABOLIC PANEL
Anion gap: 9 (ref 5–15)
BUN: 14 mg/dL (ref 8–23)
CO2: 27 mmol/L (ref 22–32)
Calcium: 8.3 mg/dL — ABNORMAL LOW (ref 8.9–10.3)
Chloride: 106 mmol/L (ref 98–111)
Creatinine, Ser: 1.05 mg/dL — ABNORMAL HIGH (ref 0.44–1.00)
GFR calc Af Amer: >60 mL/min (ref >60)
GFR calc non Af Amer: 54 mL/min — ABNORMAL LOW (ref >60)
Glucose, Bld: 117 mg/dL — ABNORMAL HIGH (ref 70–99)
Potassium: 3.6 mmol/L (ref 3.5–5.1)
Sodium: 142 mmol/L (ref 135–145)

## 2020-06-01 LAB — CULTURE, BLOOD (ROUTINE X 2)
Culture: NO GROWTH
Culture: NO GROWTH
Special Requests: ADEQUATE
Special Requests: ADEQUATE

## 2020-06-01 LAB — GLUCOSE, CAPILLARY
Glucose-Capillary: 124 mg/dL — ABNORMAL HIGH (ref 70–99)
Glucose-Capillary: 100 mg/dL — ABNORMAL HIGH (ref 70–99)
Glucose-Capillary: 105 mg/dL — ABNORMAL HIGH (ref 70–99)
Glucose-Capillary: 105 mg/dL — ABNORMAL HIGH (ref 70–99)

## 2020-06-01 LAB — MAGNESIUM: Magnesium: 2.1 mg/dL (ref 1.7–2.4)

## 2020-06-01 MED ORDER — AMLODIPINE BESYLATE 2.5 MG PO TABS
2.5000 mg | ORAL_TABLET | Freq: Every day | ORAL | Status: DC
  Administered 2020-06-02 – 2020-06-03 (×2): 2.5 mg via ORAL
  Filled 2020-06-01 (×2): qty 1

## 2020-06-01 MED ORDER — POTASSIUM CHLORIDE CRYS ER 20 MEQ PO TBCR
20.0000 meq | EXTENDED_RELEASE_TABLET | Freq: Every day | ORAL | Status: DC
  Administered 2020-06-02 – 2020-06-03 (×2): 20 meq via ORAL
  Filled 2020-06-01 (×2): qty 1

## 2020-06-01 MED ORDER — AMIODARONE HCL 200 MG PO TABS: 100.0000 mg | ORAL_TABLET | Freq: Every day | ORAL | 3 refills | Status: DC

## 2020-06-01 MED ORDER — NITROGLYCERIN 0.4 MG SL SUBL: 0.4000 mg | SUBLINGUAL_TABLET | SUBLINGUAL | Status: DC | PRN

## 2020-06-01 MED ORDER — AMIODARONE HCL 200 MG PO TABS
100.0000 mg | ORAL_TABLET | Freq: Every day | ORAL | Status: DC
  Administered 2020-06-02 – 2020-06-10 (×9): 100 mg via ORAL
  Filled 2020-06-01 (×9): qty 1

## 2020-06-01 MED ORDER — VALSARTAN 160 MG PO TABS: 160.0000 mg | ORAL_TABLET | Freq: Every day | ORAL | 3 refills | Status: DC

## 2020-06-01 MED ORDER — MOMETASONE FURO-FORMOTEROL FUM 200-5 MCG/ACT IN AERO
2.0000 | INHALATION_SPRAY | Freq: Two times a day (BID) | RESPIRATORY_TRACT | Status: DC
  Administered 2020-06-01 – 2020-06-10 (×17): 2 via RESPIRATORY_TRACT
  Filled 2020-06-01: qty 8.8

## 2020-06-01 MED ORDER — PANTOPRAZOLE SODIUM 40 MG PO TBEC: 40.0000 mg | DELAYED_RELEASE_TABLET | Freq: Every day | ORAL | 3 refills | Status: DC

## 2020-06-01 MED ORDER — INSULIN ASPART 100 UNIT/ML ~~LOC~~ SOLN
4.0000 [IU] | SUBCUTANEOUS | Status: DC
  Administered 2020-06-01 – 2020-06-02 (×3): 4 [IU] via SUBCUTANEOUS

## 2020-06-01 MED ORDER — AMLODIPINE BESYLATE 5 MG PO TABS: 5.0000 mg | ORAL_TABLET | Freq: Every day | ORAL | 3 refills | Status: DC

## 2020-06-01 MED ORDER — FERROUS SULFATE 325 (65 FE) MG PO TABS: 325.0000 mg | ORAL_TABLET | Freq: Three times a day (TID) | ORAL | 3 refills | Status: DC

## 2020-06-01 MED ORDER — ATORVASTATIN CALCIUM 80 MG PO TABS
80.0000 mg | ORAL_TABLET | Freq: Every day | ORAL | Status: DC
  Administered 2020-06-02 – 2020-06-10 (×9): 80 mg via ORAL
  Filled 2020-06-01 (×9): qty 1

## 2020-06-01 MED ORDER — INSULIN ASPART 100 UNIT/ML ~~LOC~~ SOLN
0.0000 [IU] | Freq: Three times a day (TID) | SUBCUTANEOUS | Status: DC
  Administered 2020-06-02 – 2020-06-10 (×12): 2 [IU] via SUBCUTANEOUS

## 2020-06-01 MED ORDER — ASPIRIN EC 81 MG PO TBEC
81.0000 mg | DELAYED_RELEASE_TABLET | Freq: Every day | ORAL | Status: DC
  Administered 2020-06-02 – 2020-06-10 (×9): 81 mg via ORAL
  Filled 2020-06-01 (×9): qty 1

## 2020-06-01 MED ORDER — DOCUSATE SODIUM 50 MG/5ML PO LIQD
100.0000 mg | Freq: Two times a day (BID) | ORAL | Status: DC
  Administered 2020-06-01 – 2020-06-06 (×7): 100 mg via ORAL
  Filled 2020-06-01 (×8): qty 10

## 2020-06-01 MED ORDER — FERROUS SULFATE 325 (65 FE) MG PO TABS
325.0000 mg | ORAL_TABLET | Freq: Three times a day (TID) | ORAL | Status: DC
  Administered 2020-06-01 – 2020-06-10 (×25): 325 mg via ORAL
  Filled 2020-06-01 (×25): qty 1

## 2020-06-01 MED ORDER — POLYETHYLENE GLYCOL 3350 17 G PO PACK
17.0000 g | PACK | Freq: Every day | ORAL | Status: DC
  Administered 2020-06-02 – 2020-06-05 (×2): 17 g via ORAL
  Filled 2020-06-01 (×7): qty 1

## 2020-06-01 MED ORDER — ALUM & MAG HYDROXIDE-SIMETH 200-200-20 MG/5ML PO SUSP: 30.0000 mL | ORAL | 0 refills | Status: DC | PRN

## 2020-06-01 MED ORDER — POLYETHYLENE GLYCOL 3350 17 G PO PACK: 17.0000 g | PACK | Freq: Every day | ORAL | 0 refills | Status: DC

## 2020-06-01 MED ORDER — CLOPIDOGREL BISULFATE 75 MG PO TABS
75.0000 mg | ORAL_TABLET | Freq: Every day | ORAL | Status: DC
  Administered 2020-06-02 – 2020-06-10 (×9): 75 mg via ORAL
  Filled 2020-06-01 (×9): qty 1

## 2020-06-01 MED ORDER — PANTOPRAZOLE SODIUM 40 MG PO TBEC
40.0000 mg | DELAYED_RELEASE_TABLET | Freq: Every day | ORAL | Status: DC
  Administered 2020-06-01 – 2020-06-09 (×9): 40 mg via ORAL
  Filled 2020-06-01 (×9): qty 1

## 2020-06-01 MED ORDER — ALBUTEROL SULFATE (2.5 MG/3ML) 0.083% IN NEBU
2.5000 mg | INHALATION_SOLUTION | Freq: Four times a day (QID) | RESPIRATORY_TRACT | Status: DC | PRN
  Administered 2020-06-03 – 2020-06-10 (×2): 2.5 mg via RESPIRATORY_TRACT
  Filled 2020-06-01 (×2): qty 3

## 2020-06-01 MED ORDER — METFORMIN HCL 500 MG PO TABS: 500.0000 mg | ORAL_TABLET | Freq: Every day | ORAL | 1 refills | Status: DC

## 2020-06-01 MED ORDER — ISOSORBIDE MONONITRATE ER 30 MG PO TB24
30.0000 mg | ORAL_TABLET | Freq: Every day | ORAL | Status: DC
  Administered 2020-06-02 – 2020-06-06 (×5): 30 mg via ORAL
  Filled 2020-06-01 (×6): qty 1

## 2020-06-01 MED ORDER — ACETAMINOPHEN 325 MG PO TABS
650.0000 mg | ORAL_TABLET | Freq: Four times a day (QID) | ORAL | Status: DC | PRN
  Administered 2020-06-02 – 2020-06-10 (×17): 650 mg via ORAL
  Filled 2020-06-01 (×21): qty 2

## 2020-06-01 NOTE — Progress Notes (Signed)
Jamse Arn, MD  Physician  Physical Medicine and Rehabilitation  PMR Pre-admission     Addendum  Date of Service:  05/30/2020 4:43 PM      Related encounter: ED to Hosp-Admission (Discharged) from 05/24/2020 in [place] all '[x]' Manual'[x]' Template'[x]' Copied  Added by: '[x]' Karl Bales, Evalee Mutton, RN'[x]' Genella Mech, CCC-SLP  '[]' [place]over for details PMR Admission Coordinator Pre-Admission Assessment  Patient: Sherry Fitzpatrick is an 69 y.o., female MRN: [id number] DOB: [birth year] Height: '5\' 4"'  (162.6 cm) Weight: 116.8 kg                                                                                                                                                  Insurance Information HMO: yes    PPO:      PCP:      IPA:      80/20:      OTHER:  PRIMARY: [employer]       Policy#: [id number]      Subscriber: Pt.  CM Name: Phineas Real    Phone#: [phone]     Fax#: [phone] Pre-Cert#: [id number] for 5 days with update due on 06/05/20    Employer:  Benefits:  Phone #:      Name:  Eff. Date: 10/16/2019 - 10/14/2020   Deduct:Deductible: does not have for in-network providers     Out of Pocket Max: OOP Max: $3,900 ($1,791.44 met)      Life Max: OOP Max: $3,900 ($1,791.44 met) CIR: CIR: $295/day co-pay for days 1-6, $0/day co-pay for days 7-90     SNF: SNF:  $0/day co-pay for days 1-20, $184/day co-pay for days 21-100; limited to 100 days/cal yr Outpatient: $10-$40/visit co-pay pending service; visits limited by medical necessity Home Health:  100% coverage, 0% co-insurance; limited by medical necessity DME: 80% coverage, 20% co-insurance Providers: in network  SECONDARY: none    The "Data Collection Information Summary" for patients in Inpatient Rehabilitation Facilities with attached "Privacy Act [place] Records" was provided and verbally reviewed with: Patient  Emergency Contact Information         Contact  Information    Name Relation Home Work [place] Daughter [phone]  [phone]   Delos Haring Daughter   [phone]     Current Medical History  Patient Admitting Diagnosis: NSTEMI, Altered Mental status  History of Present Illness: Sherry Hickling Manningis a 69 y.o.right-handed femalewith history of hypertension, systolic congestive heart failure, CVA without residual, diabetes mellitus, hyperlipidemia, morbid obesity with BMI 44.24, CAD with stenting maintained on aspirin and Plavix. Per chart review patient lives with her children. Reportedly independent prior to admission. 1 level home 2 steps to entry. Daughter does most of the housework and heavy meal prep. Presented 05/24/2020 with chest tightness/dyspnea radiating to the left shoulder and  very little relief with nitroglycerin. EKG normal sinus rhythm with ST-T wave changes in inferior lateral leads noted to have minimally elevated high-sensitivity troponin. Started on IV heparin and nitro. Underwent cardiac catheterization with stenting per cardiology services. Hospital course complicated by hypotension. On the morning of 05/27/2020 episode of emesis severe lower abdominal pain and right groin pain relieved with tramadol. Later in the day episode of acute altered mental status/dysarthria question seizure with hypotension requiring dopamine. MRI showed patchy small volume acute ischemic infarct involving the right splenium and right cerebral white matter with additional punctate acute ischemic left cerebellar infarct. No associated hemorrhage. MRA negative of the head. MRA of the neck approximate 30 to 40% stenosis about the origins of both internal carotid arteries left slightly worse than right. EEG negative for seizure. Vascular ultrasound of groin showed no evidence of pseudoaneurysm or DVT. Patient currently remains on aspirin and Plavix therapy for CVA prophylaxis. She was started on Rocephin  for CAP. Currently on a dysphagia #3 diet.  Complete NIHSS TOTAL: 3 Glasgow Coma Scale Score: 15  Past Medical History      Past Medical History:  Diagnosis Date  . Alterations of sensations, late effect of cerebrovascular disease(438.6)   . Arthritis    "all over"  . Backache, unspecified   . Body mass index 40.0-44.9, adult ([place])   . Carpal tunnel syndrome   . Dizziness and giddiness   . Esophageal reflux   . Generalized pain   . Headache    "had alot til I had pituitary tumor removed"  . Heart murmur   . Other abnormal glucose   . Other and unspecified hyperlipidemia   . Other malaise and fatigue   . Other postprocedural status(V45.89)   . Other symptoms involving cardiovascular system   . Sleep apnea    "did test; they wanted me to get a CPAP but I never did get it" (04/14/2015) Does use inhaler at night (01 2021)  . Stroke ([place]) 06/2003; 02/2010   "minor; minor" ; denies residual on 04/14/2015  . Unspecified disorder of the pituitary gland and its hypothalamic control   . Unspecified essential hypertension   . Unspecified visual disturbance     Family History  family history includes Diabetes in her father; Heart failure in her mother and sister; Hypertension in her daughter, mother, and sister.  Prior Rehab/Hospitalizations:  Has the patient had prior rehab or hospitalizations prior to admission? No  Has the patient had major surgery during 100 days prior to admission? Yes  Current Medications   Current Facility-Administered Medications:  .  0.9 %  sodium chloride infusion (Manually program via Guardrails IV Fluids), , Intravenous, Once, Bowser, Grace E, NP .  0.9 %  sodium chloride infusion (Manually program via Guardrails IV Fluids), , Intravenous, Once, Ogan, Okoronkwo U, MD .  0.9 %  sodium chloride infusion, 250 mL, Intravenous, PRN, Irish Lack, Jayadeep S, MD .  0.9 %  sodium chloride infusion, 250 mL, Intravenous, Continuous,  Bowser, Laurel Dimmer, NP .  Place/Maintain arterial line, , , Until Discontinued **AND** 0.9 %  sodium chloride infusion, , Intra-arterial, PRN, Olalere, Adewale A, MD .  acetaminophen (TYLENOL) 160 MG/5ML solution 1,000 mg, 1,000 mg, Per Tube, Q6H PRN, Aslam, Sadia, MD .  albuterol (PROVENTIL) (2.5 MG/3ML) 0.083% nebulizer solution 2.5 mg, 2.5 mg, Nebulization, Q6H PRN, Charolette Forward, MD .  alum & mag hydroxide-simeth (MAALOX/MYLANTA) 200-200-20 MG/5ML suspension 30 mL, 30 mL, Oral, Q4H PRN, Charolette Forward, MD .  amiodarone (  PACERONE) tablet 100 mg, 100 mg, Per Tube, Daily, Charolette Forward, MD, 100 mg at 06/01/20 0921 .  amLODipine (NORVASC) tablet 2.5 mg, 2.5 mg, Oral, Daily, Charolette Forward, MD, 2.5 mg at 06/01/20 0920 .  aspirin EC tablet 81 mg, 81 mg, Oral, Daily, Charolette Forward, MD, 81 mg at 06/01/20 1035 .  atorvastatin (LIPITOR) tablet 80 mg, 80 mg, Per Tube, Daily, Aslam, Sadia, MD, 80 mg at 06/01/20 0922 .  clopidogrel (PLAVIX) tablet 75 mg, 75 mg, Per Tube, Daily, Aslam, Sadia, MD, 75 mg at 06/01/20 0921 .  diphenhydrAMINE (BENADRYL) capsule 25 mg, 25 mg, Oral, Q8H PRN, Charolette Forward, MD .  docusate (COLACE) 50 MG/5ML liquid 100 mg, 100 mg, Per Tube, BID, Bowser, Laurel Dimmer, NP, 100 mg at 06/01/20 0920 .  ferrous sulfate tablet 325 mg, 325 mg, Oral, TID WC, Harwani, Mohan, MD, 325 mg at 06/01/20 1255 .  insulin aspart (novoLOG) injection 0-15 Units, 0-15 Units, Subcutaneous, TID WC, Aslam, Sadia, MD, 2 Units at 06/01/20 0920 .  insulin aspart (novoLOG) injection 4 Units, 4 Units, Subcutaneous, Q4H, Aslam, Sadia, MD, 4 Units at 05/30/20 1216 .  isosorbide mononitrate (IMDUR) 24 hr tablet 30 mg, 30 mg, Oral, Daily, Charolette Forward, MD, 30 mg at 06/01/20 0921 .  lip balm (CARMEX) ointment, , Topical, PRN, Charolette Forward, MD .  MEDLINE mouth rinse, 15 mL, Mouth Rinse, BID, Harwani, Mohan, MD, 15 mL at 05/31/20 2130 .  mometasone-formoterol (DULERA) 200-5 MCG/ACT inhaler 2 puff, 2 puff, Inhalation,  BID, Charolette Forward, MD, 2 puff at 06/01/20 0827 .  nitroGLYCERIN (NITROSTAT) SL tablet 0.4 mg, 0.4 mg, Sublingual, Q5 Min x 3 PRN, Charolette Forward, MD, 0.4 mg at 05/24/20 1635 .  ondansetron (ZOFRAN) injection 4 mg, 4 mg, Intravenous, Q6H PRN, Jettie Booze, MD, 4 mg at 05/27/20 0319 .  pantoprazole (PROTONIX) EC tablet 40 mg, 40 mg, Oral, QHS, Harwani, Mohan, MD, 40 mg at 05/31/20 2130 .  phenol (CHLORASEPTIC) mouth spray 1 spray, 1 spray, Mouth/Throat, PRN, Margaretha Seeds, MD .  polyethylene glycol (MIRALAX / GLYCOLAX) packet 17 g, 17 g, Per Tube, Daily, Aslam, Sadia, MD, 17 g at 06/01/20 0920 .  potassium chloride (KLOR-CON) CR tablet 20 mEq, 20 mEq, Oral, Daily, Charolette Forward, MD, 20 mEq at 06/01/20 0920 .  Racepinephrine HCl 2.25 % nebulizer solution 0.25 mL, 0.25 mL, Nebulization, Once, Margaretha Seeds, MD .  sodium chloride flush (NS) 0.9 % injection 3 mL, 3 mL, Intravenous, Q12H, Jettie Booze, MD, 3 mL at 05/31/20 2130 .  sodium chloride flush (NS) 0.9 % injection 3 mL, 3 mL, Intravenous, PRN, Jettie Booze, MD  Patients Current Diet:  Diet Order                  DIET DYS 3 Room service appropriate? Yes; Fluid consistency: Thin  Diet effective now                  Precautions / Restrictions Precautions Precautions: Fall Precaution Comments: watch BP, permissive HTN 220/120 allowed Restrictions Weight Bearing Restrictions: No   Has the patient had 2 or more falls or a fall with injury in the past year?No  Prior Activity Level Community (5-7x/wk): Pt. went out daily   Prior Functional Level Prior Function Level of Independence: Independent Comments: daughter does most housework and heavy meal prep  Self Care: Did the patient need help bathing, dressing, using the toilet or eating?  Independent  Indoor Mobility: Did the patient  need assistance with walking from room to room (with or without device)? Independent  Stairs: Did  the patient need assistance with internal or external stairs (with or without device)? Independent  Functional Cognition: Did the patient need help planning regular tasks such as shopping or remembering to take medications? Independent  Home Assistive Devices / Equipment Home Assistive Devices/Equipment: None Home Equipment: Walker - 2 wheels, Bedside commode, Tub bench  Prior Device Use: Indicate devices/aids used by the patient prior to current illness, exacerbation or injury? None of the above  Current Functional Level Cognition  Arousal/Alertness: Awake/alert Overall Cognitive Status: Impaired/Different from baseline Current Attention Level: Selective Orientation Level: Oriented X4 Following Commands: Follows one step commands with increased time Safety/Judgement: Decreased awareness of safety, Decreased awareness of deficits General Comments: confusion noted at times and increased time to initiate tasks; intermittent cues for safety including instructing pt not to stand until therapists ready to do so  Attention: Focused, Sustained, Selective Focused Attention: Appears intact Sustained Attention: Appears intact Selective Attention: Appears intact Memory: Impaired Memory Impairment: Retrieval deficit, Decreased short term memory Decreased Short Term Memory: Verbal basic, Functional basic Awareness: Impaired Awareness Impairment: Intellectual impairment, Emergent impairment Problem Solving: Appears intact Safety/Judgment: Impaired    Extremity Assessment (includes Sensation/Coordination)  Upper Extremity Assessment: Defer to OT evaluation RUE Deficits / Details: grossly weaker than LUE, pt denies sensation changes, weak grip RUE Coordination: decreased fine motor, decreased gross motor  Lower Extremity Assessment: Overall WFL for tasks assessed (pt grossly 3/5 strength bil LE with knee extension, hip flexion and knee flexion)    ADLs  Overall ADL's : Needs  assistance/impaired Eating/Feeding:Dysphagia 3 diet Grooming: Minimal assistance, Sitting Upper Body Bathing: Minimal assistance, Sitting Lower Body Bathing: Maximal assistance, +2 for physical assistance, +2 for safety/equipment, Sit to/from stand Upper Body Dressing : Sitting, Moderate assistance Lower Body Dressing: Total assistance, +2 for physical assistance, +2 for safety/equipment, Sit to/from stand Toilet Transfer: Moderate assistance, +2 for physical assistance, +2 for safety/equipment, Stand-pivot, BSC Toileting- Clothing Manipulation and Hygiene: Total assistance, +2 for physical assistance, +2 for safety/equipment, Sit to/from stand Toileting - Clothing Manipulation Details (indicate cue type and reason): totalA for posterior pericare after BM Functional mobility during ADLs: Maximal assistance, +2 for physical assistance, +2 for safety/equipment (HHA, maxA+2 bed, modA+2 transfer)    Mobility  Overal bed mobility: Needs Assistance Bed Mobility: Supine to Sit Supine to sit: Max assist, +2 for physical assistance, +2 for safety/equipment General bed mobility comments: increased time to initiate with assist for trunk and to fully scoot LEs over EOB/hips towards EOB. semi roll to left with cues throughout. Dizziness with transition to sitting with right beating nystagmus noted grossly 1 min    Transfers  Overall transfer level: Needs assistance Equipment used: 2 person hand held assist Transfers: Sit to/from Stand, Stand Pivot Transfers Sit to Stand: Mod assist, +2 physical assistance, +2 safety/equipment Stand pivot transfers: Mod assist, +2 physical assistance, +2 safety/equipment General transfer comment: assist to rise and stabilize in standing, +2 via face to face method. performed x2 sit<>stand from EOB, stand pivot EOB>BSC>recliner with steadying assist throughout, cues for sequencing steps and multimodal cues to reach back for recliner armrest     Ambulation / Gait /  Stairs / Wheelchair Mobility  Ambulation/Gait General Gait Details: not yet ready to attempt    Posture / Balance Dynamic Sitting Balance Sitting balance - Comments: close minguard assist once stable at EOB Balance Overall balance assessment: Needs assistance Sitting-balance support: Feet supported,  Single extremity supported Sitting balance-Leahy Scale: Fair Sitting balance - Comments: close minguard assist once stable at EOB Standing balance support: Bilateral upper extremity supported Standing balance-Leahy Scale: Poor Standing balance comment: +2 assist for standing balance     Special needs/care consideration  Designated visitor [place] (from acute therapy documentation) Living Arrangements: Children  Lives With: Daughter Available Help at Discharge: Family, Available 24 hours/day Type of Home: House Home Layout: One level Home Access: Stairs to enter Technical brewer of Steps: 2 Bathroom Shower/Tub: Chiropodist: [place]: No  Discharge Living Setting Plans for Discharge Living Setting: Patient's home Type of Home at Discharge: House Discharge Home Layout: One level Discharge Home Access: Level entry Discharge Bathroom Shower/Tub: Tub/shower unit Discharge Bathroom Toilet: Standard Discharge Bathroom Accessibility: Yes How Accessible: Accessible via walker Does the patient have any problems obtaining your medications?: No  Social/Family/Support Systems Patient Roles: Other (Comment) Contact Information: [phone] Anticipated Caregiver: Wilkie Aye (Pt.'s daughter works from home and pt. Will be going to stay with her and 31 year old grandson. Daughter confirms 24/7 support).  Anticipated Caregiver's Contact Information: [phone] Ability/Limitations of Caregiver: none  Caregiver Availability: 24/7 Discharge Plan Discussed with Primary Caregiver: Yes Is  Caregiver In Agreement with Plan?: Yes Does Caregiver/Family have Issues with Lodging/Transportation while Pt is in Rehab?: No   Goals Patient/Family Goal for Rehab: PT/OT/SLP Min A  Expected length of stay: 10-12 days Pt/Family Agrees to Admission and willing to participate: Yes Program Orientation Provided & Reviewed with Pt/Caregiver Including Roles  & Responsibilities: Yes   Decrease burden of Care through IP rehab admission: N/A  Possible need for SNF placement upon discharge:not anticipated    Patient Condition: This patient's medical and functional status has changed since the consult dated: 05/30/2020 in which the Rehabilitation Physician determined and documented that the patient's condition is appropriate for intensive rehabilitative care in an inpatient rehabilitation facility. See "History of Present Illness" (above) for medical update. No functional changes. Patient's medical and functional status update has been discussed with the Rehabilitation physician and patient remains appropriate for inpatient rehabilitation. Will admit to inpatient rehab today.  Preadmission Screen Completed By:  Retta Diones, RN, 06/01/2020 1:19 PM (initially completed by Clemens Catholic, SLP and updated today by me) ______________________________________________________________________   Discussed status with Dr. Posey Pronto on 06/01/20 at 59 am and received approval for admission today.  Admission Coordinator:  Retta Diones, time 1319/Date 06/01/20       Revision History                                              Note Details  Author Posey Pronto, Domenick Bookbinder, MD File Time 06/01/2020 1:24 PM  Author Type Physician Status Addendum  Last Editor Jamse Arn, MD Service Physical Medicine and [place] # [id number] Admit Date 06/01/2020

## 2020-06-01 NOTE — IPOC Note (Signed)
Individualized overall Plan of Care [facility]) Patient Details Name: Sherry Fitzpatrick MRN: [id number] DOB: 1950/11/09  Admitting Diagnosis: Acute cerebral infarction [facility])  Hospital Problems: Principal Problem:   Acute cerebral infarction [facility]) Active Problems:   Cerebral infarction involving left cerebellar artery (HCC)   Leukocytosis   Systolic congestive heart failure (HCC)   PAF (paroxysmal atrial fibrillation) ([place])     Functional Problem List: Nursing Pain, Bladder, Bowel, Edema, Endurance  PT Balance, Endurance, Motor, Nutrition, Pain, Safety  OT Balance, Safety, Cognition, Endurance, Motor, Pain  SLP Cognition  TR         Basic ADL's: OT Bathing, Dressing, Toileting     Advanced  ADL's: OT Simple Meal Preparation     Transfers: PT Bed Mobility, Bed to Chair, Car, Manufacturing systems engineer, Metallurgist: PT Ambulation, Stairs     Additional Impairments: OT    SLP Communication expression    TR      Anticipated Outcomes Item Anticipated Outcome  Self Feeding    Swallowing      Basic self-care  supervision  Toileting  supervision   Bathroom Transfers supervision  Bowel/Bladder  to be continent x 2  Transfers  Mod I  Locomotion  supervision with LRAD  Communication  mod I  Cognition  mod I  Pain  less than 3 out of 10 on pain scale  Safety/Judgment  to remain fall free while in rehab   Therapy Plan: PT Intensity: Minimum of 1-2 x/day ,45 to 90 minutes PT Frequency: 5 out of 7 days PT Duration Estimated Length of Stay: 10-14 days OT Intensity: Minimum of 1-2 x/day, 45 to 90 minutes OT Frequency: 5 out of 7 days OT Duration/Estimated Length of Stay: 10-14 days SLP Intensity: Minumum of 1-2 x/day, 30 to 90 minutes SLP Frequency: 3 to 5 out of 7 days SLP Duration/Estimated Length of Stay: 10-14 days    Team Interventions: Nursing Interventions Patient/Family Education, Disease Management/Prevention, Skin Care/Wound Management,  Discharge Planning, Bladder Management, Bowel Management, Pain Management, Medication Management  PT interventions Ambulation/gait training, Balance/vestibular training, Cognitive remediation/compensation, Community reintegration, Engineer, drilling, Patient/family education, Pain management, Stair training, UE/LE Coordination activities, Visual/perceptual remediation/compensation, Therapeutic Activities, Psychosocial support, Therapeutic Exercise, Neuromuscular re-education, Functional mobility training, Discharge planning, Disease management/prevention, UE/LE Strength taining/ROM, Splinting/orthotics, Skin care/wound management, Functional electrical stimulation  OT Interventions Balance/vestibular training, Discharge planning, Pain management, Self Care/advanced ADL retraining, Therapeutic Activities, UE/LE Coordination activities, Cognitive remediation/compensation, Functional mobility training, Patient/family education, Therapeutic Exercise, DME/adaptive equipment instruction, Neuromuscular re-education, UE/LE Strength taining/ROM  SLP Interventions Cognitive remediation/compensation, Speech/Language facilitation, Therapeutic Exercise, Patient/family education, Functional tasks  TR Interventions    SW/CM Interventions Discharge Planning, Psychosocial Support, Patient/Family Education   Barriers to Discharge MD  Medical stability, Weight and Behavior  Nursing      PT Home environment access/layout, Lack of/limited family support, Nutrition means 3 steps with no rails to bed/bath.  OT      SLP      SW       Team Discharge Planning: Destination: PT-Home ,OT- Home , SLP-Home Projected Follow-up: PT-24 hour supervision/assistance (HHPT vs OPPT pending progress), OT-  Other (comment), Home health OT (intermittent supervision), SLP-Other (comment) (TBD, likely none) Projected Equipment Needs: PT-To be determined, OT- To be determined, SLP-None recommended by SLP Equipment  Details: PT-Pt has: rollator, BSC, tub-bench, OT-  Patient/family involved in discharge planning: PT- Patient, Family member/caregiver,  OT-Patient, SLP-Patient  MD ELOS: 10-14 days. Medical Rehab Prognosis:  Good  Assessment: 69 year old right-handed female with history of hypertension, systolic congestive heart failure, TIA without residual weakness, diabetes mellitus, hyperlipidemia, morbid obesity with BMI of 44.24, CAD with stenting maintained on aspirin and Plavix.  History taken from chart review and patient.  She presented on 05/24/2020 with chest pain and dyspnea, radiating to the left shoulder and very little relief with nitroglycerin.  EKG normal sinus rhythm with ST-T wave changes in inferior lateral leads noted to have minimally elevated high-sensitivity troponin.  Started on IV heparin and nitro.  Most recent echocardiogram with ejection fraction of 65%.  She underwent cardiac catheterization with stenting per cardiology services.  Hospital course further complicated by hypotension.  On the morning of 05/27/2020 episode of emesis severe lower abdominal pain and right groin pain relieved with tramadol.  Later in the day episode of acute altered mental status dysarthria question seizure as well as ongoing hypotension requiring dopamine.  MRI showed patchy small volume acute ischemic infarct in the right splenium and right cerebral white matter with additional punctate acute ischemic left cerebellar infarct.  No associated hemorrhage.  MRA negative of the head.  MRA of the neck approximately 30 to 40% stenosis about the origins of both internal carotid arteries left slightly worse than right.  EEG negative for seizures.  Vascular ultrasound of groin showed no evidence of pseudoaneurysm or DVT.  Patient currently remained on aspirin Plavix for CVA prophylaxis.  She was placed on Rocephin for CAP 8/16/ 2021x2 doses.  Tolerating a mechanical soft diet with thin liquids.  Patient with resulting functional  deficits with mobility, transfers, endurance, self-care, swallowing, cognition.  We will set goals for Supervision with PT/OT and Mod I with SLP.    Due to the current state of emergency, patients may not be receiving their 3-hours of Medicare-mandated therapy.  See Team Conference Notes for weekly updates to the plan of care

## 2020-06-01 NOTE — Progress Notes (Signed)
Patient having increased PVC's, HR in 80s, BP 166/62, patient asymptomatic.  MD Harwani notified. New orders for labs MD [facility] notified of lab results

## 2020-06-01 NOTE — Discharge Instructions (Signed)
Coronary Angiogram With Stent Coronary angiogram with stent placement is a procedure to widen or open a narrow blood vessel of the heart (coronary artery). Arteries may become blocked by cholesterol buildup (plaques) in the lining of the artery wall. When a coronary artery becomes partially blocked, blood flow to that area decreases. This may lead to chest pain or a heart attack (myocardial infarction). A stent is a small piece of metal that looks like mesh or spring. Stent placement may be done as treatment after a heart attack, or to prevent a heart attack if a blocked artery is found by a coronary angiogram. Let your health care provider know about:  Any allergies you have, including allergies to medicines or contrast dye.  All medicines you are taking, including vitamins, herbs, eye drops, creams, and over-the-counter medicines.  Any problems you or family members have had with anesthetic medicines.  Any blood disorders you have.  Any surgeries you have had.  Any medical conditions you have, including kidney problems or kidney failure.  Whether you are pregnant or may be pregnant.  Whether you are breastfeeding. What are the risks? Generally, this is a safe procedure. However, serious problems may occur, including:  Damage to nearby structures or organs, such as the heart, blood vessels, or kidneys.  A return of blockage.  Bleeding, infection, or bruising at the insertion site.  A collection of blood under the skin (hematoma) at the insertion site.  A blood clot in another part of the body.  Allergic reaction to medicines or dyes.  Bleeding into the abdomen (retroperitoneal bleeding).  Stroke (rare).  Heart attack (rare). What happens before the procedure? Staying hydrated Follow instructions from your health care provider about hydration, which may include:  Up to 2 hours before the procedure - you may continue to drink clear liquids, such as water, clear fruit juice,  black coffee, and plain tea.  Eating and drinking restrictions Follow instructions from your health care provider about eating and drinking, which may include:  8 hours before the procedure - stop eating heavy meals or foods, such as meat, fried foods, or fatty foods.  6 hours before the procedure - stop eating light meals or foods, such as toast or cereal.  2 hours before the procedure - stop drinking clear liquids. Medicines Ask your health care provider about:  Changing or stopping your regular medicines. This is especially important if you are taking diabetes medicines or blood thinners.  Taking medicines such as aspirin and ibuprofen. These medicines can thin your blood. Do not take these medicines unless your health care provider tells you to take them. ? Generally, aspirin is recommended before a thin tube, called a catheter, is passed through a blood vessel and inserted into the heart (cardiac catheterization).  Taking over-the-counter medicines, vitamins, herbs, and supplements. General instructions  Do not use any products that contain nicotine or tobacco for at least 4 weeks before the procedure. These products include cigarettes, e-cigarettes, and chewing tobacco. If you need help quitting, ask your health care provider.  Plan to have someone take you home from the hospital or clinic.  If you will be going home right after the procedure, plan to have someone with you for 24 hours.  You may have tests and imaging procedures.  Ask your health care provider: ? How your insertion site will be marked. Ask which artery will be used for the procedure. ? What steps will be taken to help prevent infection. These may include:    Removing hair at the insertion site.  Washing skin with a germ-killing soap.  Taking antibiotic medicine. What happens during the procedure?   An IV will be inserted into one of your veins.  Electrodes may be placed on your chest to monitor your  heart rate during the procedure.  You will be given one or more of the following: ? A medicine to help you relax (sedative). ? A medicine to numb the area (local anesthetic) for catheter insertion.  A small incision will be made for catheter insertion.  The catheter will be inserted into an artery using a guide wire. The location may be in your groin, your wrist, or the fold of your arm (near your elbow).  An X-ray procedure (fluoroscopy) will be used to help guide the catheter to the opening of the heart arteries.  A dye will be injected into the catheter. X-rays will be taken. The dye helps to show where any narrowing or blockages are located in the arteries.  Tell your health care provider if you have chest pain or trouble breathing.  A tiny wire will be guided to the blocked spot, and a balloon will be inflated to make the artery wider.  The stent will be expanded to crush the plaques into the wall of the vessel. The stent will hold the area open and improve the blood flow. Most stents have a drug coating to reduce the risk of the stent narrowing over time.  The artery may be made wider using a drill, laser, or other tools that remove plaques.  The catheter will be removed when the blood flow improves. The stent will stay where it was placed, and the lining of the artery will grow over it.  A bandage (dressing) will be placed on the insertion site. Pressure will be applied to stop bleeding.  The IV will be removed. This procedure may vary among health care providers and hospitals. What happens after the procedure?  Your blood pressure, heart rate, breathing rate, and blood oxygen level will be monitored until you leave the hospital or clinic.  If the procedure is done through the leg, you will lie flat in bed for a few hours or for as long as told by your health care provider. You will be instructed not to bend or cross your legs.  The insertion site and the pulse in your foot  or wrist will be checked often.  You may have more blood tests, X-rays, and a test that records the electrical activity of your heart (electrocardiogram, or ECG).  Do not drive for 24 hours if you were given a sedative during your procedure. Summary  Coronary angiogram with stent placement is a procedure to widen or open a narrowed coronary artery. This is done to treat heart problems.  Before the procedure, let your health care provider know about all the medical conditions and surgeries you have or have had.  This is a safe procedure. However, some problems may occur, including damage to nearby structures or organs, bleeding, blood clots, or allergies.  Follow your health care provider's instructions about eating, drinking, medicines, and other lifestyle changes, such as quitting tobacco use before the procedure. This information is not intended to replace advice given to you by your health care provider. Make sure you discuss any questions you have with your health care provider. Document Revised: 04/22/2019 Document Reviewed: 04/22/2019 [employer] Patient Education  2020 [employer]  

## 2020-06-01 NOTE — Progress Notes (Signed)
Sherry Ribas, MD  Physician  Physical Medicine and Rehabilitation  Consult Note     Signed  Date of Service:  05/30/2020 11:53 AM      Related encounter: ED to Hosp-Admission (Discharged) from 05/24/2020 in [facility]      Signed      Expand All Collapse All  Show:Clear all [x] Manual[x] Template[] Copied  Added by: [x] Angiulli, Lavon Paganini, PA-C[x] Ranell Patrick Clide Deutscher, MD  [] Hover for details      Physical Medicine and Rehabilitation Consult Reason for Consult: Altered mental status Referring Physician: Dr. Terrence Dupont   HPI: Sherry Fitzpatrick is a 69 y.o. right-handed female with history of hypertension, systolic congestive heart failure, CVA without residual, diabetes mellitus, hyperlipidemia, morbid obesity with BMI 44.24, CAD with stenting maintained on aspirin and Plavix.  Per chart review patient lives with her children.  Reportedly independent prior to admission.  1 level home 2 steps to entry.  Daughter does most of the housework and heavy meal prep.  Presented 05/24/2020 with chest tightness/dyspnea radiating to the left shoulder and very little relief with nitroglycerin.  EKG normal sinus rhythm with ST-T wave changes in inferior lateral leads noted to have minimally elevated high-sensitivity troponin.  Started on IV heparin and nitro.  Underwent cardiac catheterization with stenting per cardiology services.  Hospital course complicated by hypotension.  On the morning of 05/27/2020 episode of emesis severe lower abdominal pain and right groin pain relieved with tramadol.  Later in the day episode of acute altered mental status/dysarthria question seizure with hypotension requiring dopamine.  MRI showed patchy small volume acute ischemic infarct involving the right splenium and right cerebral white matter with additional punctate acute ischemic left cerebellar infarct.  No associated hemorrhage.  MRA negative of the head.  MRA of the neck approximate 30 to  40% stenosis about the origins of both internal carotid arteries left slightly worse than right.  EEG negative for seizure.  Vascular ultrasound of groin showed no evidence of pseudoaneurysm or DVT.  Patient currently remains on aspirin and Plavix therapy for CVA prophylaxis.  She was started on Rocephin for CAP.  Currently on a dysphagia #3 diet.  Therapy evaluations completed with recommendations of physical medicine rehab consult.   Review of Systems  Constitutional: Negative for fever.  HENT: Negative for hearing loss.   Eyes: Negative for blurred vision and double vision.  Respiratory: Positive for shortness of breath. Negative for cough.   Cardiovascular: Positive for chest pain, palpitations and leg swelling.  Gastrointestinal: Positive for abdominal pain and nausea.  Musculoskeletal: Negative for joint pain and myalgias.  Skin: Negative for rash.  Neurological: Positive for dizziness, speech change, weakness and headaches.  All other systems reviewed and are negative.      Past Medical History:  Diagnosis Date  . Alterations of sensations, late effect of cerebrovascular disease(438.6)   . Arthritis    "all over"  . Backache, unspecified   . Body mass index 40.0-44.9, adult ([place])   . Carpal tunnel syndrome   . Dizziness and giddiness   . Esophageal reflux   . Generalized pain   . Headache    "had alot til I had pituitary tumor removed"  . Heart murmur   . Other abnormal glucose   . Other and unspecified hyperlipidemia   . Other malaise and fatigue   . Other postprocedural status(V45.89)   . Other symptoms involving cardiovascular system   . Sleep apnea    "did test; they wanted me  to get a CPAP but I never did get it" (04/14/2015) Does use inhaler at night (01 2021)  . Stroke ([place]) 06/2003; 02/2010   "minor; minor" ; denies residual on 04/14/2015  . Unspecified disorder of the pituitary gland and its hypothalamic control   . Unspecified essential  hypertension   . Unspecified visual disturbance         Past Surgical History:  Procedure Laterality Date  . CARPAL TUNNEL RELEASE Left ~ 2014  . CORONARY STENT INTERVENTION N/A 05/24/2020   Procedure: CORONARY STENT INTERVENTION;  Surgeon: Jettie Booze, MD;  Location: [facility];  Service: Cardiovascular;  Laterality: N/A;  . JOINT REPLACEMENT    . LEFT HEART CATH AND CORONARY ANGIOGRAPHY N/A 05/24/2020   Procedure: LEFT HEART CATH AND CORONARY ANGIOGRAPHY;  Surgeon: Charolette Forward, MD;  Location: [facility];  Service: Cardiovascular;  Laterality: N/A;  . TOTAL HIP ARTHROPLASTY Right 11/06/2010  . TRANSPHENOIDAL / TRANSNASAL HYPOPHYSECTOMY / RESECTION PITUITARY TUMOR  06/13/2012  . TUBAL LIGATION  1974        Family History  Problem Relation Age of Onset  . Heart failure Mother   . Hypertension Mother   . Diabetes Father   . Hypertension Sister   . Heart failure Sister   . Hypertension Daughter    Social History:  reports that she has never smoked. She has never used smokeless tobacco. She reports current alcohol use. She reports that she does not use drugs. Allergies:       Allergies  Allergen Reactions  . Morphine And Related Hives and Nausea And Vomiting  . Penicillins Hives    Has patient had a PCN reaction causing immediate rash, facial/tongue/throat swelling, SOB or lightheadedness with hypotension: Yes Has patient had a PCN reaction causing severe rash involving mucus membranes or skin necrosis: No Has patient had a PCN reaction that required hospitalization: No Has patient had a PCN reaction occurring within the last 10 years: No If all of the above answers are "NO", then may proceed with Cephalosporin use.  Marland Kitchen Percocet [Oxycodone-Acetaminophen] Nausea And Vomiting  . Ace Inhibitors Nausea And Vomiting and Cough  . Iodinated Diagnostic Agents Nausea And Vomiting       . Iodine Itching  . Etodolac Nausea And Vomiting  .  Tramadol Nausea And Vomiting  . Vicodin [Hydrocodone-Acetaminophen] Nausea And Vomiting         Medications Prior to Admission  Medication Sig Dispense Refill  . acetaminophen (TYLENOL) 500 MG tablet Take 1,000 mg by mouth every 6 (six) hours as needed for headache (pain).    Marland Kitchen albuterol (PROVENTIL HFA;VENTOLIN HFA) 108 (90 Base) MCG/ACT inhaler Inhale 2 puffs into the lungs every 6 (six) hours as needed for wheezing. (Patient taking differently: Inhale 2 puffs into the lungs every 6 (six) hours as needed for wheezing or shortness of breath. ) 1 Inhaler 3  . amLODipine (NORVASC) 10 MG tablet Take 1 tablet (10 mg total) by mouth daily. (Patient taking differently: Take 10 mg by mouth daily at 2 PM. ) 30 tablet 1  . aspirin 81 MG chewable tablet Chew 81 mg by mouth daily.    Marland Kitchen atorvastatin (LIPITOR) 80 MG tablet Take 1 tablet (80 mg total) by mouth daily. (Patient taking differently: Take 80 mg by mouth every Thursday. ) 30 tablet 3  . budesonide-formoterol (SYMBICORT) 160-4.5 MCG/ACT inhaler Inhale 2 puffs into the lungs 2 (two) times daily. 1 Inhaler 1  . carvedilol (COREG) 6.25 MG  tablet Take 6.25 mg by mouth 2 (two) times daily.    . cloNIDine (CATAPRES) 0.3 MG tablet Take 1 tablet (0.3 mg total) by mouth 2 (two) times daily. (Patient taking differently: Take 0.3 mg by mouth 2 (two) times daily. at 2pm and after supper) 60 tablet 1  . clopidogrel (PLAVIX) 75 MG tablet Take 1 tablet (75 mg total) by mouth daily. 30 tablet 3  . furosemide (LASIX) 40 MG tablet Take 1 tablet (40 mg total) by mouth daily at 2 PM. (Patient taking differently: Take 40 mg by mouth daily. )    . hydrALAZINE (APRESOLINE) 25 MG tablet Take 1 tablet (25 mg total) by mouth every 8 (eight) hours. 90 tablet 0  . ipratropium-albuterol (DUONEB) 0.5-2.5 (3) MG/3ML SOLN Take 3 mLs by nebulization every 4 (four) hours as needed. (Patient taking differently: Take 3 mLs by nebulization every 4 (four) hours as needed (for  breathing). ) 360 mL 0  . potassium chloride (KLOR-CON) 10 MEQ tablet Take 2 tablets (20 mEq total) by mouth daily. (Patient taking differently: Take 20 mEq by mouth daily at 2 PM. ) 30 tablet 1  . valsartan (DIOVAN) 320 MG tablet Take 320 mg by mouth daily.     . [DISCONTINUED] metFORMIN (GLUCOPHAGE) 500 MG tablet Take 1 tablet (500 mg total) by mouth 2 (two) times daily with a meal. 30 tablet 1  . guaiFENesin (MUCINEX) 600 MG 12 hr tablet Take 2 tablets (1,200 mg total) by mouth 2 (two) times daily. (Patient not taking: Reported on 03/27/2020) 14 tablet 0    Home: [place] expects to be discharged to:: Private residence Living Arrangements: Children Available Help at Discharge: Family, Available 24 hours/day Type of Home: House Home Access: Stairs to enter [employer] of Steps: 2 Home Layout: One level Bathroom Shower/Tub: Chiropodist: Standard Home Equipment: Environmental consultant - 2 wheels, Bedside commode, Tub bench  Functional History: Prior Function Level of Independence: Independent Comments: daughter does most housework and heavy meal prep Functional Status:  Mobility: Bed Mobility Overal bed mobility: Needs Assistance Bed Mobility: Supine to Sit Supine to sit: Max assist, +2 for physical assistance, +2 for safety/equipment General bed mobility comments: increased time to initiate with assist for trunk and to fully scoot LEs over EOB/hips towards EOB. semi roll to left with cues throughout. Dizziness with transition to sitting with right beating nystagmus noted grossly 1 min Transfers Overall transfer level: Needs assistance Equipment used: 2 person hand held assist Transfers: Sit to/from Stand, Stand Pivot Transfers Sit to Stand: Mod assist, +2 physical assistance, +2 safety/equipment Stand pivot transfers: Mod assist, +2 physical assistance, +2 safety/equipment General transfer comment: assist to rise and stabilize in standing, +2  via face to face method. performed x2 sit<>stand from EOB, stand pivot EOB>BSC>recliner with steadying assist throughout, cues for sequencing steps and multimodal cues to reach back for recliner armrest  Ambulation/Gait General Gait Details: not yet ready to attempt  ADL: ADL Overall ADL's : Needs assistance/impaired Eating/Feeding: NPO Grooming: Minimal assistance, Sitting Upper Body Bathing: Minimal assistance, Sitting Lower Body Bathing: Maximal assistance, +2 for physical assistance, +2 for safety/equipment, Sit to/from stand Upper Body Dressing : Sitting, Moderate assistance Lower Body Dressing: Total assistance, +2 for physical assistance, +2 for safety/equipment, Sit to/from stand Toilet Transfer: Moderate assistance, +2 for physical assistance, +2 for safety/equipment, Stand-pivot, BSC Toileting- Clothing Manipulation and Hygiene: Total assistance, +2 for physical assistance, +2 for safety/equipment, Sit to/from stand Toileting - Clothing Manipulation Details (indicate  cue type and reason): totalA for posterior pericare after BM Functional mobility during ADLs: Maximal assistance, +2 for physical assistance, +2 for safety/equipment (HHA, maxA+2 bed, modA+2 transfer)  Cognition: Cognition Overall Cognitive Status: Impaired/Different from baseline Orientation Level: Oriented to person, Oriented to place, Disoriented to time, Disoriented to situation Cognition Arousal/Alertness: Awake/alert Behavior During Therapy: WFL for tasks assessed/performed Overall Cognitive Status: Impaired/Different from baseline Area of Impairment: Orientation, Attention, Memory, Following commands, Safety/judgement, Awareness, Problem solving Orientation Level: Disoriented to, Time Current Attention Level: Selective Memory: Decreased recall of precautions, Decreased short-term memory Following Commands: Follows one step commands with increased time Safety/Judgement: Decreased awareness of safety,  Decreased awareness of deficits Awareness: Emergent Problem Solving: Requires verbal cues, Requires tactile cues, Difficulty sequencing, Decreased initiation, Slow processing General Comments: confusion noted at times and increased time to initiate tasks; intermittent cues for safety including instructing pt not to stand until therapists ready to do so   Blood pressure (!) 167/76, pulse (!) 59, temperature 98.3 F (36.8 C), temperature source Oral, resp. rate (!) 25, height 5\' 4"  (1.626 m), weight 116.9 kg, SpO2 93 %. Physical Exam General: Alert and oriented x 3, No apparent distress HEENT: Head is normocephalic, atraumatic, PERRLA, EOMI, sclera anicteric, oral mucosa pink and moist, dentition intact, ext ear canals clear,  Neck: Supple without JVD or lymphadenopathy Heart: Reg rate and rhythm. No murmurs rubs or gallops Chest: CTA bilaterally without wheezes, rales, or rhonchi; no distress Abdomen: Soft, tender over abdominal incision  Extremities: No clubbing, cyanosis, or edema. Pulses are 2+ Skin: abdominal incision C/D/I Neuro: Patient is alert.  Speech is dysarthric.  Follows simple commands. Difficulty elevating right leg. 4/5 DF and PF on right side. 3/5 strength throughout LLE. 4/5 strength in bilateral upper extremities. FTN intact Psych: Pt's affect is appropriate. Pt is cooperative. Great sense of humor    Lab Results Last 24 Hours       Results for orders placed or performed during the hospital encounter of 05/24/20 (from the past 24 hour(s))  Glucose, capillary     Status: Abnormal   Collection Time: 05/29/20 12:12 PM  Result Value Ref Range   Glucose-Capillary 112 (H) 70 - 99 mg/dL  CBC     Status: Abnormal   Collection Time: 05/29/20 12:56 PM  Result Value Ref Range   WBC 17.4 (H) 4.0 - 10.5 K/uL   RBC 3.39 (L) 3.87 - 5.11 MIL/uL   Hemoglobin 9.7 (L) 12.0 - 15.0 g/dL   HCT 29.3 (L) 36 - 46 %   MCV 86.4 80.0 - 100.0 fL   MCH 28.6 26.0 - 34.0 pg   MCHC  33.1 30.0 - 36.0 g/dL   RDW 16.4 (H) 11.5 - 15.5 %   Platelets 188 150 - 400 K/uL   nRBC 0.4 (H) 0.0 - 0.2 %  Glucose, capillary     Status: Abnormal   Collection Time: 05/29/20  3:45 PM  Result Value Ref Range   Glucose-Capillary 113 (H) 70 - 99 mg/dL  Glucose, capillary     Status: Abnormal   Collection Time: 05/29/20  7:46 PM  Result Value Ref Range   Glucose-Capillary 114 (H) 70 - 99 mg/dL  Glucose, capillary     Status: Abnormal   Collection Time: 05/29/20 11:31 PM  Result Value Ref Range   Glucose-Capillary 106 (H) 70 - 99 mg/dL  Glucose, capillary     Status: Abnormal   Collection Time: 05/30/20  3:42 AM  Result Value Ref Range  Glucose-Capillary 110 (H) 70 - 99 mg/dL  Basic metabolic panel     Status: Abnormal   Collection Time: 05/30/20  5:01 AM  Result Value Ref Range   Sodium 143 135 - 145 mmol/L   Potassium 3.7 3.5 - 5.1 mmol/L   Chloride 108 98 - 111 mmol/L   CO2 25 22 - 32 mmol/L   Glucose, Bld 129 (H) 70 - 99 mg/dL   BUN 28 (H) 8 - 23 mg/dL   Creatinine, Ser 1.28 (H) 0.44 - 1.00 mg/dL   Calcium 8.3 (L) 8.9 - 10.3 mg/dL   GFR calc non Af Amer 42 (L) >60 mL/min   GFR calc Af Amer 49 (L) >60 mL/min   Anion gap 10 5 - 15  CBC     Status: Abnormal   Collection Time: 05/30/20  5:01 AM  Result Value Ref Range   WBC 15.4 (H) 4.0 - 10.5 K/uL   RBC 3.15 (L) 3.87 - 5.11 MIL/uL   Hemoglobin 9.0 (L) 12.0 - 15.0 g/dL   HCT 28.4 (L) 36 - 46 %   MCV 90.2 80.0 - 100.0 fL   MCH 28.6 26.0 - 34.0 pg   MCHC 31.7 30.0 - 36.0 g/dL   RDW 16.9 (H) 11.5 - 15.5 %   Platelets 208 150 - 400 K/uL   nRBC 0.5 (H) 0.0 - 0.2 %  Glucose, capillary     Status: Abnormal   Collection Time: 05/30/20  8:07 AM  Result Value Ref Range   Glucose-Capillary 125 (H) 70 - 99 mg/dL  Glucose, capillary     Status: Abnormal   Collection Time: 05/30/20 11:21 AM  Result Value Ref Range   Glucose-Capillary 128 (H) 70 - 99 mg/dL      Imaging Results (Last 48  hours)  DG Chest Port 1V same Day  Result Date: 05/30/2020 CLINICAL DATA:  Status post extubation.  Shortness of breath. EXAM: PORTABLE CHEST 1 VIEW COMPARISON:  May 28, 2020 FINDINGS: Endotracheal tube and nasogastric tube have been removed. Central catheter tip is in the superior vena cava. No pneumothorax. There is mild atelectatic change in the bases and right mid lung regions, stable. No new opacity evident. Heart is prominent, stable, with pulmonary vascularity normal. No adenopathy. There is aortic atherosclerosis. No bone lesions. IMPRESSION: No pneumothorax. Stable cardiac prominence. Areas of atelectatic change, stable. No new opacity. Aortic Atherosclerosis (ICD10-I70.0). Electronically Signed   By: Lowella Grip III M.D.   On: 05/30/2020 07:57      Assessment/Plan: Diagnosis: left cerebellar stroke 1. Does the need for close, 24 hr/day medical supervision in concert with the patient's rehab needs make it unreasonable for this patient to be served in a less intensive setting? Yes 2. Co-Morbidities requiring supervision/potential complications: large right groin hematoma, aortic atherosclerosis, sigmoid diverticulosis, morbid obesity (44.24), DM, HTN 3. Due to bladder management, bowel management, safety, skin/wound care, disease management, medication administration, pain management and patient education, does the patient require 24 hr/day rehab nursing? Yes 4. Does the patient require coordinated care of a physician, rehab nurse, therapy disciplines of PT, OT to address physical and functional deficits in the context of the above medical diagnosis(es)? Yes Addressing deficits in the following areas: balance, endurance, locomotion, strength, transferring, bowel/bladder control, bathing, dressing, feeding, grooming, toileting and psychosocial support 5. Can the patient actively participate in an intensive therapy program of at least 3 hrs of therapy per day at least 5 days per  week? Yes 6. The potential for patient  to make measurable gains while on inpatient rehab is excellent 7. Anticipated functional outcomes upon discharge from inpatient rehab are modified independent  with PT, modI with OT, independent with SLP. 8. Estimated rehab length of stay to reach the above functional goals is: 10-14 days 9. Anticipated discharge destination: Home 10. Overall Rehab/Functional Prognosis: excellent  RECOMMENDATIONS: This patient's condition is appropriate for continued rehabilitative care in the following setting: CIR Patient has agreed to participate in recommended program. Yes Note that insurance prior authorization may be required for reimbursement for recommended care.  Comment: Mrs. Filter is an excellent CIR candidate. Thank you for this consult. Admission coordinator to follow.   I have personally performed a face to face diagnostic evaluation, including, but not limited to relevant history and physical exam findings, of this patient and developed relevant assessment and plan.  Additionally, I have reviewed and concur with the physician assistant's documentation above.  Leeroy Cha, MD'   Lavon Paganini [place], PA-C 05/30/2020        Revision History                     Routing History                Note Details  Author Sherry Ribas, MD File Time 05/30/2020 12:39 PM  Author Type Physician Status Signed  Last Editor Sherry Ribas, MD Service Physical Medicine and [place] # [id number] Admit Date 06/01/2020

## 2020-06-01 NOTE — Discharge Summary (Signed)
Discharge summary dictated on 06/01/2020 dictation number is [phone]

## 2020-06-01 NOTE — H&P (Addendum)
Physical Medicine and Rehabilitation Admission H&P    No chief complaint on file. : HPI: Sherry Fitzpatrick is a 69 year old right-handed female with history of hypertension, systolic congestive heart failure, TIA without residual weakness, diabetes mellitus, hyperlipidemia, morbid obesity with BMI of 44.24, CAD with stenting maintained on aspirin and Plavix.  History taken from chart review and patient.  Patient lives with her children.  Reportedly independent prior to admission.  1 level home 2 steps to entry.  Daughter does most of the housework and heavy meal preparation.  She presented on 05/24/2020 with chest pain and dyspnea, radiating to the left shoulder and very little relief with nitroglycerin.  EKG normal sinus rhythm with ST-T wave changes in inferior lateral leads noted to have minimally elevated high-sensitivity troponin.  Started on IV heparin and nitro.  Most recent echocardiogram with ejection fraction of 65%.  She underwent cardiac catheterization with stenting per cardiology services.  Hospital course further complicated by hypotension.  On the morning of 05/27/2020 episode of emesis severe lower abdominal pain and right groin pain relieved with tramadol.  Later in the day episode of acute altered mental status dysarthria question seizure as well as ongoing hypotension requiring dopamine.  MRI showed patchy small volume acute ischemic infarct in the right splenium and right cerebral white matter with additional punctate acute ischemic left cerebellar infarct.  No associated hemorrhage.  MRA negative of the head.  MRA of the neck approximately 30 to 40% stenosis about the origins of both internal carotid arteries left slightly worse than right.  EEG negative for seizures.  Vascular ultrasound of groin showed no evidence of pseudoaneurysm or DVT.  Patient currently remained on aspirin Plavix for CVA prophylaxis.  She was placed on Rocephin for CAP 8/16/ 2021x2 doses.  Tolerating a mechanical  soft diet with thin liquids.  Therapy evaluations completed and patient was admitted for a comprehensive rehab program.  Please see preadmission assessment from earlier today as well.  Review of Systems  Constitutional: Negative for chills, fever and malaise/fatigue.  HENT: Negative for hearing loss.   Eyes: Negative for blurred vision and double vision.  Respiratory: Positive for shortness of breath. Negative for cough and wheezing.   Gastrointestinal: Positive for abdominal pain.  Genitourinary: Negative for dysuria, flank pain and urgency.  Musculoskeletal: Positive for myalgias.  Skin: Negative for rash.  Neurological: Positive for dizziness, speech change and headaches. Negative for weakness.  All other systems reviewed and are negative.  Past Medical History:  Diagnosis Date  . Alterations of sensations, late effect of cerebrovascular disease(438.6)   . Arthritis    "all over"  . Backache, unspecified   . Body mass index 40.0-44.9, adult ([place])   . Carpal tunnel syndrome   . Dizziness and giddiness   . Esophageal reflux   . Generalized pain   . Headache    "had alot til I had pituitary tumor removed"  . Heart murmur   . Other abnormal glucose   . Other and unspecified hyperlipidemia   . Other malaise and fatigue   . Other postprocedural status(V45.89)   . Other symptoms involving cardiovascular system   . Sleep apnea    "did test; they wanted me to get a CPAP but I never did get it" (04/14/2015) Does use inhaler at night (01 2021)  . Stroke ([place]) 06/2003; 02/2010   "minor; minor" ; denies residual on 04/14/2015  . Unspecified disorder of the pituitary gland and its hypothalamic control   .  Unspecified essential hypertension   . Unspecified visual disturbance    Past Surgical History:  Procedure Laterality Date  . CARPAL TUNNEL RELEASE Left ~ 2014  . CORONARY STENT INTERVENTION N/A 05/24/2020   Procedure: CORONARY STENT INTERVENTION;  Surgeon: Jettie Booze, MD;   Location: [facility];  Service: Cardiovascular;  Laterality: N/A;  . JOINT REPLACEMENT    . LEFT HEART CATH AND CORONARY ANGIOGRAPHY N/A 05/24/2020   Procedure: LEFT HEART CATH AND CORONARY ANGIOGRAPHY;  Surgeon: Charolette Forward, MD;  Location: [facility];  Service: Cardiovascular;  Laterality: N/A;  . TOTAL HIP ARTHROPLASTY Right 11/06/2010  . TRANSPHENOIDAL / TRANSNASAL HYPOPHYSECTOMY / RESECTION PITUITARY TUMOR  06/13/2012  . TUBAL LIGATION  1974   Family History  Problem Relation Age of Onset  . Heart failure Mother   . Hypertension Mother   . Diabetes Father   . Hypertension Sister   . Heart failure Sister   . Hypertension Daughter    Social History:  reports that she has never smoked. She has never used smokeless tobacco. She reports current alcohol use. She reports that she does not use drugs. Allergies:  Allergies  Allergen Reactions  . Morphine And Related Hives and Nausea And Vomiting  . Penicillins Hives    Has patient had a PCN reaction causing immediate rash, facial/tongue/throat swelling, SOB or lightheadedness with hypotension: Yes Has patient had a PCN reaction causing severe rash involving mucus membranes or skin necrosis: No Has patient had a PCN reaction that required hospitalization: No Has patient had a PCN reaction occurring within the last 10 years: No If all of the above answers are "NO", then may proceed with Cephalosporin use.  Marland Kitchen Percocet [Oxycodone-Acetaminophen] Nausea And Vomiting  . Ace Inhibitors Nausea And Vomiting and Cough  . Iodinated Diagnostic Agents Nausea And Vomiting       . Iodine Itching  . Etodolac Nausea And Vomiting  . Tramadol Nausea And Vomiting  . Vicodin [Hydrocodone-Acetaminophen] Nausea And Vomiting   Medications Prior to Admission  Medication Sig Dispense Refill  . acetaminophen (TYLENOL) 500 MG tablet Take 1,000 mg by mouth every 6 (six) hours as needed for headache (pain).    Marland Kitchen albuterol (PROVENTIL HFA;VENTOLIN  HFA) 108 (90 Base) MCG/ACT inhaler Inhale 2 puffs into the lungs every 6 (six) hours as needed for wheezing. (Patient taking differently: Inhale 2 puffs into the lungs every 6 (six) hours as needed for wheezing or shortness of breath. ) 1 Inhaler 3  . alum & mag hydroxide-simeth (MAALOX/MYLANTA) 200-200-20 MG/5ML suspension Take 30 mLs by mouth every 4 (four) hours as needed for indigestion or heartburn. 355 mL 0  . amiodarone (PACERONE) 200 MG tablet Place 0.5 tablets (100 mg total) into feeding tube daily. 30 tablet 3  . [START ON 06/02/2020] amLODipine (NORVASC) 5 MG tablet Take 1 tablet (5 mg total) by mouth daily. 30 tablet 3  . aspirin 81 MG chewable tablet Chew 81 mg by mouth daily.    Marland Kitchen atorvastatin (LIPITOR) 80 MG tablet Take 1 tablet (80 mg total) by mouth daily. (Patient taking differently: Take 80 mg by mouth every Thursday. ) 30 tablet 3  . budesonide-formoterol (SYMBICORT) 160-4.5 MCG/ACT inhaler Inhale 2 puffs into the lungs 2 (two) times daily. 1 Inhaler 1  . carvedilol (COREG) 6.25 MG tablet Take 6.25 mg by mouth 2 (two) times daily.    . clopidogrel (PLAVIX) 75 MG tablet Take 1 tablet (75 mg total) by mouth daily. 30 tablet 3  .  ferrous sulfate 325 (65 FE) MG tablet Take 1 tablet (325 mg total) by mouth 3 (three) times daily with meals. 90 tablet 3  . furosemide (LASIX) 40 MG tablet Take 1 tablet (40 mg total) by mouth daily at 2 PM. (Patient taking differently: Take 40 mg by mouth daily. )    . ipratropium-albuterol (DUONEB) 0.5-2.5 (3) MG/3ML SOLN Take 3 mLs by nebulization every 4 (four) hours as needed. (Patient taking differently: Take 3 mLs by nebulization every 4 (four) hours as needed (for breathing). ) 360 mL 0  . metFORMIN (GLUCOPHAGE) 500 MG tablet Take 1 tablet (500 mg total) by mouth daily with breakfast. 30 tablet 1  . nitroGLYCERIN (NITROSTAT) 0.4 MG SL tablet Place 1 tablet (0.4 mg total) under the tongue every 5 (five) minutes x 3 doses as needed for chest pain. 25  tablet 12  . pantoprazole (PROTONIX) 40 MG tablet Take 1 tablet (40 mg total) by mouth at bedtime. 30 tablet 3  . [START ON 06/02/2020] polyethylene glycol (MIRALAX / GLYCOLAX) 17 g packet Place 17 g into feeding tube daily. 14 each 0  . potassium chloride (KLOR-CON) 10 MEQ tablet Take 2 tablets (20 mEq total) by mouth daily. (Patient taking differently: Take 20 mEq by mouth daily at 2 PM. ) 30 tablet 1  . valsartan (DIOVAN) 160 MG tablet Take 1 tablet (160 mg total) by mouth daily. 30 tablet 3    Drug Regimen Review Drug regimen was reviewed and remains appropriate no significant issues identified  Home: Home Living Family/patient expects to be discharged to:: Private residence Living Arrangements: Children   Functional History:    Functional Status:  Mobility:          ADL:    Cognition: Cognition Orientation Level: Oriented X4    Physical Exam: Blood pressure (!) 181/71, pulse 73, temperature 98.5 F (36.9 C), temperature source Oral, resp. rate 18, height 5\' 4"  (1.626 m), weight 116.8 kg, SpO2 95 %. Physical Exam Vitals reviewed.  Constitutional:      Appearance: She is obese.  HENT:     Head: Normocephalic and atraumatic.     Right Ear: External ear normal.     Left Ear: External ear normal.     Nose: Nose normal.  Eyes:     General:        Right eye: No discharge.        Left eye: No discharge.     Extraocular Movements: Extraocular movements intact.  Cardiovascular:     Rate and Rhythm: Normal rate and regular rhythm.  Pulmonary:     Effort: Pulmonary effort is normal. No respiratory distress.     Breath sounds: No stridor.  Abdominal:     General: Abdomen is flat.     Tenderness: There is abdominal tenderness.  Musculoskeletal:     Cervical back: Normal range of motion and neck supple.     Comments: No edema or tenderness in extremities  Skin:    General: Skin is warm and dry.  Neurological:     Mental Status: She is alert.     Comments: Makes  eye contact with examiner.   Follows commands.   Provides her name and age. Dysarthria Motor: 4+/5 throughout  Psychiatric:        Mood and Affect: Mood normal.        Behavior: Behavior normal.     Results for orders placed or performed during the hospital encounter of 05/24/20 (from the past 48 hour(s))  Glucose, capillary     Status: Abnormal   Collection Time: 05/30/20  9:24 PM  Result Value Ref Range   Glucose-Capillary 133 (H) 70 - 99 mg/dL    Comment: Glucose reference range applies only to samples taken after fasting for at least 8 hours.  Basic metabolic panel     Status: Abnormal   Collection Time: 05/31/20  3:35 AM  Result Value Ref Range   Sodium 142 135 - 145 mmol/L   Potassium 3.5 3.5 - 5.1 mmol/L   Chloride 108 98 - 111 mmol/L   CO2 24 22 - 32 mmol/L   Glucose, Bld 121 (H) 70 - 99 mg/dL    Comment: Glucose reference range applies only to samples taken after fasting for at least 8 hours.   BUN 22 8 - 23 mg/dL   Creatinine, Ser 1.14 (H) 0.44 - 1.00 mg/dL   Calcium 8.3 (L) 8.9 - 10.3 mg/dL   GFR calc non Af Amer 49 (L) >60 mL/min   GFR calc Af Amer 56 (L) >60 mL/min   Anion gap 10 5 - 15    Comment: Performed at [facility] [address], [place], [place] [address]  CBC     Status: Abnormal   Collection Time: 05/31/20  3:35 AM  Result Value Ref Range   WBC 16.3 (H) 4.0 - 10.5 K/uL   RBC 3.16 (L) 3.87 - 5.11 MIL/uL   Hemoglobin 9.3 (L) 12.0 - 15.0 g/dL   HCT 28.4 (L) 36 - 46 %   MCV 89.9 80.0 - 100.0 fL   MCH 29.4 26.0 - 34.0 pg   MCHC 32.7 30.0 - 36.0 g/dL   RDW 16.8 (H) 11.5 - 15.5 %   Platelets 246 150 - 400 K/uL   nRBC 0.3 (H) 0.0 - 0.2 %    Comment: Performed at [facility], [place] [address] [place], [place] [address]  Glucose, capillary     Status: Abnormal   Collection Time: 05/31/20  8:15 AM  Result Value Ref Range   Glucose-Capillary 113 (H) 70 - 99 mg/dL    Comment: Glucose reference range applies only to samples taken after  fasting for at least 8 hours.  Troponin I (High Sensitivity)     Status: Abnormal   Collection Time: 05/31/20  9:20 AM  Result Value Ref Range   Troponin I (High Sensitivity) 80 (H) <18 ng/L    Comment: (NOTE) Elevated high sensitivity troponin I (hsTnI) values and significant  changes across serial measurements may suggest ACS but many other  chronic and acute conditions are known to elevate hsTnI results.  Refer to the "Links" section for chest pain algorithms and additional  guidance. Performed at [facility], [place] [address] [place], [place] [address]   Glucose, capillary     Status: Abnormal   Collection Time: 05/31/20 12:05 PM  Result Value Ref Range   Glucose-Capillary 140 (H) 70 - 99 mg/dL    Comment: Glucose reference range applies only to samples taken after fasting for at least 8 hours.  Glucose, capillary     Status: Abnormal   Collection Time: 05/31/20  4:42 PM  Result Value Ref Range   Glucose-Capillary 128 (H) 70 - 99 mg/dL    Comment: Glucose reference range applies only to samples taken after fasting for at least 8 hours.  Glucose, capillary     Status: Abnormal   Collection Time: 05/31/20  8:49 PM  Result Value Ref Range  Glucose-Capillary 126 (H) 70 - 99 mg/dL    Comment: Glucose reference range applies only to samples taken after fasting for at least 8 hours.  Glucose, capillary     Status: Abnormal   Collection Time: 06/01/20  7:57 AM  Result Value Ref Range   Glucose-Capillary 124 (H) 70 - 99 mg/dL    Comment: Glucose reference range applies only to samples taken after fasting for at least 8 hours.  Glucose, capillary     Status: Abnormal   Collection Time: 06/01/20 11:32 AM  Result Value Ref Range   Glucose-Capillary 105 (H) 70 - 99 mg/dL    Comment: Glucose reference range applies only to samples taken after fasting for at least 8 hours.  Glucose, capillary     Status: Abnormal   Collection Time: 06/01/20 12:04 PM  Result Value Ref Range    Glucose-Capillary 100 (H) 70 - 99 mg/dL    Comment: Glucose reference range applies only to samples taken after fasting for at least 8 hours.  Basic metabolic panel     Status: Abnormal   Collection Time: 06/01/20  2:12 PM  Result Value Ref Range   Sodium 142 135 - 145 mmol/L   Potassium 3.6 3.5 - 5.1 mmol/L   Chloride 106 98 - 111 mmol/L   CO2 27 22 - 32 mmol/L   Glucose, Bld 117 (H) 70 - 99 mg/dL    Comment: Glucose reference range applies only to samples taken after fasting for at least 8 hours.   BUN 14 8 - 23 mg/dL   Creatinine, Ser 1.05 (H) 0.44 - 1.00 mg/dL   Calcium 8.3 (L) 8.9 - 10.3 mg/dL   GFR calc non Af Amer 54 (L) >60 mL/min   GFR calc Af Amer >60 >60 mL/min   Anion gap 9 5 - 15    Comment: Performed at [facility] [address], [place], [place] [address]  Magnesium     Status: None   Collection Time: 06/01/20  2:12 PM  Result Value Ref Range   Magnesium 2.1 1.7 - 2.4 mg/dL    Comment: Performed at [facility] [address], [place], [place] [address]   No results found.     Medical Problem List and Plan: 1.  Altered mental status with dysarthria secondary to left cerebellar infarction after cardiac catheterization/PAF  -patient may shower  -ELOS/Goals: 12-15 days/Min A  Admit to CIR 2.  Antithrombotics: -DVT/anticoagulation: SCDs  -antiplatelet therapy: Aspirin 81 mg daily and Plavix 75 mg daily 3. Pain Management: Tylenol as needed 4. Mood: Provide emotional support  -antipsychotic agents: N/A 5. Neuropsych: This patient is capable of making decisions on her own behalf. 6. Skin/Wound Care: Routine skin checks 7. Fluids/Electrolytes/Nutrition: Routine in and outs. 8.  Hypotension with atrial fibrillation. Low dose Norvasc 2.5 mg daily. Continue amiodarone 100 mg daily.  Cardiac rate controlled  Monitor with increased exertion. 9.  Diabetes mellitus with hyperglycemia.  Hemoglobin A1c 7.3.  NovoLog 4 units every 4 hours.  Check blood  sugars as directed.  Monitor with increased mobility. 10.  Systolic congestive heart failure.Imdur 30 mg daily.  Monitor for any signs of fluid overload  Daily weights 11.  Morbid obesity.  BMI 44.24.  Dietary follow-up.  Encourage weight loss 12.  Hyperlipidemia.  Lipitor 13.  CAP.  IV Rocephin completed.  Check oxygen saturations every shift 14.  Post stroke dysphagia: Advance as tolerated  Cathlyn Parsons, PA-C 06/01/2020   I  have personally performed a face to face diagnostic evaluation, including, but not limited to relevant history and physical exam findings, of this patient and developed relevant assessment and plan.  Additionally, I have reviewed and concur with the physician assistant's documentation above.  Delice Lesch, MD, ABPMR  The patient's status has not changed. The original post admission physician evaluation remains appropriate, and any changes from the pre-admission screening or documentation from the acute chart are noted above.   Delice Lesch, MD, ABPMR

## 2020-06-01 NOTE — Progress Notes (Signed)
IP rehab admissions:  I spoke with Dr. Terrence Dupont and I will plan for CIR admission for today.  We do have approval from insurance and we do have a bed on CIR today.  Call me for questions.  [phone]

## 2020-06-01 NOTE — Care Management Important Message (Signed)
Important Message  Patient Details  Name: Sherry Fitzpatrick MRN: [id number] Date of Birth: 1951-01-03   Medicare Important Message Given:  Yes     [name] 06/01/2020, 12:22 PM

## 2020-06-01 NOTE — Progress Notes (Signed)
Inpatient Rehabilitation Medication Review by a Pharmacist  A complete drug regimen review was completed for this patient to identify any potential clinically significant medication issues.  Clinically significant medication issues were identified:  no  Check AMION for pharmacist assigned to patient if future medication questions/issues arise during this admission.  Pharmacist comments: N/a  Time spent performing this drug regimen review (minutes):  [place], PharmD 06/01/2020 4:20 PM

## 2020-06-01 NOTE — Progress Notes (Signed)
Patient arrived on unit, oriented to unit. Reviewed medications, therapy schedule, rehab routine and plan of care. States an understanding of information reviewed. No complications noted at this time. Patient reports no pain and is AX4 Sherry Fitzpatrick L [name]  

## 2020-06-02 ENCOUNTER — Inpatient Hospital Stay (HOSPITAL_COMMUNITY): Payer: Medicare HMO

## 2020-06-02 ENCOUNTER — Inpatient Hospital Stay (HOSPITAL_COMMUNITY): Payer: Medicare HMO | Admitting: Speech Pathology

## 2020-06-02 DIAGNOSIS — I10 Essential (primary) hypertension: Secondary | ICD-10-CM

## 2020-06-02 DIAGNOSIS — I69391 Dysphagia following cerebral infarction: Secondary | ICD-10-CM | POA: Diagnosis not present

## 2020-06-02 DIAGNOSIS — D72829 Elevated white blood cell count, unspecified: Secondary | ICD-10-CM

## 2020-06-02 DIAGNOSIS — I63542 Cerebral infarction due to unspecified occlusion or stenosis of left cerebellar artery: Secondary | ICD-10-CM

## 2020-06-02 DIAGNOSIS — E1165 Type 2 diabetes mellitus with hyperglycemia: Secondary | ICD-10-CM

## 2020-06-02 DIAGNOSIS — I639 Cerebral infarction, unspecified: Secondary | ICD-10-CM | POA: Diagnosis not present

## 2020-06-02 DIAGNOSIS — Z794 Long term (current) use of insulin: Secondary | ICD-10-CM

## 2020-06-02 DIAGNOSIS — I480 Paroxysmal atrial fibrillation: Secondary | ICD-10-CM

## 2020-06-02 DIAGNOSIS — I5020 Unspecified systolic (congestive) heart failure: Secondary | ICD-10-CM

## 2020-06-02 LAB — GLUCOSE, CAPILLARY
Glucose-Capillary: 102 mg/dL — ABNORMAL HIGH (ref 70–99)
Glucose-Capillary: 112 mg/dL — ABNORMAL HIGH (ref 70–99)
Glucose-Capillary: 158 mg/dL — ABNORMAL HIGH (ref 70–99)
Glucose-Capillary: 147 mg/dL — ABNORMAL HIGH (ref 70–99)
Glucose-Capillary: 112 mg/dL — ABNORMAL HIGH (ref 70–99)
Glucose-Capillary: 145 mg/dL — ABNORMAL HIGH (ref 70–99)

## 2020-06-02 LAB — COMPREHENSIVE METABOLIC PANEL
ALT: 38 U/L (ref 0–44)
AST: 35 U/L (ref 15–41)
Albumin: 2.5 g/dL — ABNORMAL LOW (ref 3.5–5.0)
Alkaline Phosphatase: 54 U/L (ref 38–126)
Anion gap: 9 (ref 5–15)
BUN: 10 mg/dL (ref 8–23)
CO2: 26 mmol/L (ref 22–32)
Calcium: 8.2 mg/dL — ABNORMAL LOW (ref 8.9–10.3)
Chloride: 102 mmol/L (ref 98–111)
Creatinine, Ser: 0.99 mg/dL (ref 0.44–1.00)
GFR calc Af Amer: >60 mL/min (ref >60)
GFR calc non Af Amer: 58 mL/min — ABNORMAL LOW (ref >60)
Glucose, Bld: 144 mg/dL — ABNORMAL HIGH (ref 70–99)
Potassium: 3.3 mmol/L — ABNORMAL LOW (ref 3.5–5.1)
Sodium: 137 mmol/L (ref 135–145)
Total Bilirubin: 1.2 mg/dL (ref 0.3–1.2)
Total Protein: 6 g/dL — ABNORMAL LOW (ref 6.5–8.1)

## 2020-06-02 LAB — CBC WITH DIFFERENTIAL/PLATELET
Abs Immature Granulocytes: 0.31 10*3/uL — ABNORMAL HIGH (ref 0.00–0.07)
Basophils Absolute: 0.1 10*3/uL (ref 0.0–0.1)
Basophils Relative: 1 %
Eosinophils Absolute: 0.6 10*3/uL — ABNORMAL HIGH (ref 0.0–0.5)
Eosinophils Relative: 4 %
HCT: 27.7 % — ABNORMAL LOW (ref 36.0–46.0)
Hemoglobin: 8.8 g/dL — ABNORMAL LOW (ref 12.0–15.0)
Immature Granulocytes: 2 %
Lymphocytes Relative: 11 %
Lymphs Abs: 1.6 10*3/uL (ref 0.7–4.0)
MCH: 28.7 pg (ref 26.0–34.0)
MCHC: 31.8 g/dL (ref 30.0–36.0)
MCV: 90.2 fL (ref 80.0–100.0)
Monocytes Absolute: 1.5 10*3/uL — ABNORMAL HIGH (ref 0.1–1.0)
Monocytes Relative: 11 %
Neutro Abs: 10.6 10*3/uL — ABNORMAL HIGH (ref 1.7–7.7)
Neutrophils Relative %: 71 %
Platelets: 293 10*3/uL (ref 150–400)
RBC: 3.07 MIL/uL — ABNORMAL LOW (ref 3.87–5.11)
RDW: 15.7 % — ABNORMAL HIGH (ref 11.5–15.5)
WBC: 14.6 10*3/uL — ABNORMAL HIGH (ref 4.0–10.5)
nRBC: 0.3 % — ABNORMAL HIGH (ref 0.0–0.2)

## 2020-06-02 MED ORDER — EXERCISE FOR HEART AND HEALTH BOOK
Freq: Once | Status: AC
  Filled 2020-06-02: qty 1

## 2020-06-02 MED ORDER — LIVING WELL WITH DIABETES BOOK
Freq: Once | Status: AC
  Filled 2020-06-02: qty 1

## 2020-06-02 MED ORDER — INSULIN ASPART 100 UNIT/ML ~~LOC~~ SOLN
4.0000 [IU] | Freq: Three times a day (TID) | SUBCUTANEOUS | Status: DC
  Administered 2020-06-02 – 2020-06-10 (×31): 4 [IU] via SUBCUTANEOUS

## 2020-06-02 NOTE — Progress Notes (Signed)
Patient ID: Sherry Fitzpatrick, female   DOB: 1951-04-10, 69 y.o.   MRN: [id number] Met with the patient to review role of the nurse CM and collaboration with the SW [facility]) to faciltiate preparations for discharge and manage secondary stroke prevention. Patient is very drowsy; reports not sleeping well at HS and worried about things she is unable to relax enough to sleep. Reviewed risk factors for secondary stroke including CAD, Weight, HF, HTN, HLD, DM and stress. Left patient handouts and booklets for review on risk factors. Continue to follow along with the patient and share information with [facility] Nurse for follow up at discharge. Margarito Liner

## 2020-06-02 NOTE — Evaluation (Signed)
Physical Therapy Assessment and Plan  Patient Details  Name: Sherry Fitzpatrick MRN: [id number] Date of Birth: 16-Nov-1950  PT Diagnosis: Abnormal posture, Abnormality of gait, Coordination disorder, Difficulty walking and Muscle weakness Rehab Potential: Good ELOS: 10-14 days   Today's Date: 06/02/2020 PT Individual Time: 0900-1015 PT Individual Time Calculation (min): 75 min    Hospital Problem: Principal Problem:   Acute cerebral infarction [facility]) Active Problems:   Cerebral infarction involving left cerebellar artery (HCC)   Leukocytosis   Systolic congestive heart failure (HCC)   PAF (paroxysmal atrial fibrillation) ([place])   Past Medical History:  Past Medical History:  Diagnosis Date  . Alterations of sensations, late effect of cerebrovascular disease(438.6)   . Arthritis    "all over"  . Backache, unspecified   . Body mass index 40.0-44.9, adult ([place])   . Carpal tunnel syndrome   . Dizziness and giddiness   . Esophageal reflux   . Generalized pain   . Headache    "had alot til I had pituitary tumor removed"  . Heart murmur   . Other abnormal glucose   . Other and unspecified hyperlipidemia   . Other malaise and fatigue   . Other postprocedural status(V45.89)   . Other symptoms involving cardiovascular system   . Sleep apnea    "did test; they wanted me to get a CPAP but I never did get it" (04/14/2015) Does use inhaler at night (01 2021)  . Stroke ([place]) 06/2003; 02/2010   "minor; minor" ; denies residual on 04/14/2015  . Unspecified disorder of the pituitary gland and its hypothalamic control   . Unspecified essential hypertension   . Unspecified visual disturbance    Past Surgical History:  Past Surgical History:  Procedure Laterality Date  . CARPAL TUNNEL RELEASE Left ~ 2014  . CORONARY STENT INTERVENTION N/A 05/24/2020   Procedure: CORONARY STENT INTERVENTION;  Surgeon: Jettie Booze, MD;  Location: [place] [facility];  Service: Cardiovascular;  Laterality:  N/A;  . JOINT REPLACEMENT    . LEFT HEART CATH AND CORONARY ANGIOGRAPHY N/A 05/24/2020   Procedure: LEFT HEART CATH AND CORONARY ANGIOGRAPHY;  Surgeon: Charolette Forward, MD;  Location: [place] [facility];  Service: Cardiovascular;  Laterality: N/A;  . TOTAL HIP ARTHROPLASTY Right 11/06/2010  . TRANSPHENOIDAL / TRANSNASAL HYPOPHYSECTOMY / RESECTION PITUITARY TUMOR  06/13/2012  . TUBAL LIGATION  1974    Assessment & Plan Clinical Impression: Patient is a 69 year old right-handed female with history of hypertension, systolic congestive heart failure, TIA without residual weakness, diabetes mellitus, hyperlipidemia, morbid obesity with BMI of 44.24, CAD with stenting maintained on aspirin and Plavix.  History taken from chart review and patient.  Patient lives with her children.  Reportedly independent prior to admission.  1 level home 2 steps to entry.  Daughter does most of the housework and heavy meal preparation.  She presented on 05/24/2020 with chest pain and dyspnea, radiating to the left shoulder and very little relief with nitroglycerin.  EKG normal sinus rhythm with ST-T wave changes in inferior lateral leads noted to have minimally elevated high-sensitivity troponin.  Started on IV heparin and nitro.  Most recent echocardiogram with ejection fraction of 65%.  She underwent cardiac catheterization with stenting per cardiology services.  Hospital course further complicated by hypotension.  On the morning of 05/27/2020 episode of emesis severe lower abdominal pain and right groin pain relieved with tramadol.  Later in the day episode of acute altered mental status dysarthria question seizure as well as  ongoing hypotension requiring dopamine.  MRI showed patchy small volume acute ischemic infarct in the right splenium and right cerebral white matter with additional punctate acute ischemic left cerebellar infarct.  No associated hemorrhage.  MRA negative of the head.  MRA of the neck approximately 30 to 40%  stenosis about the origins of both internal carotid arteries left slightly worse than right.  EEG negative for seizures.  Vascular ultrasound of groin showed no evidence of pseudoaneurysm or DVT.  Patient currently remained on aspirin Plavix for CVA prophylaxis.  She was placed on Rocephin for CAP 8/16/ 2021x2 doses.  Tolerating a mechanical soft diet with thin liquids.  Therapy evaluations completed and patient was admitted for a comprehensive rehab program. Patient transferred to CIR on 06/01/2020 .   Patient currently requires mod with mobility secondary to muscle weakness, decreased cardiorespiratoy endurance and impaired timing and sequencing, unbalanced muscle activation, motor apraxia, decreased coordination and decreased motor planning.  Prior to hospitalization, patient was modified independent  with mobility and lived with Daughter in a House home.  Home access is  Level entry, Other (comment) (Side-door level entry at daughter's house).  Patient will benefit from skilled PT intervention to maximize safe functional mobility, minimize fall risk and decrease caregiver burden for planned discharge home with 24 hour assist.  Anticipate patient will benefit from HHPT vs OPPT pending progress during her rehabilitation  at discharge.  PT - End of Session Activity Tolerance: Tolerates 30+ min activity with multiple rests Endurance Deficit: Yes Endurance Deficit Description: increased work of breathing after completion of simple mobility tasks. PT Assessment Rehab Potential (ACUTE/IP ONLY): Good PT Barriers to Discharge: Home environment access/layout;Lack of/limited family support;Nutrition means PT Barriers to Discharge Comments: 3 steps with no rails to bed/bath. PT Patient demonstrates impairments in the following area(s): Balance;Endurance;Motor;Nutrition;Pain;Safety PT Transfers Functional Problem(s): Bed Mobility;Bed to Chair;Car;Furniture PT Locomotion Functional Problem(s):  Ambulation;Stairs PT Plan PT Intensity: Minimum of 1-2 x/day ,45 to 90 minutes PT Frequency: 5 out of 7 days PT Duration Estimated Length of Stay: 10-14 days PT Treatment/Interventions: Ambulation/gait training;Balance/vestibular training;Cognitive remediation/compensation;Community reintegration;DME/adaptive equipment instruction;Patient/family education;Pain management;Stair training;UE/LE Coordination activities;Visual/perceptual remediation/compensation;Therapeutic Activities;Psychosocial support;Therapeutic Exercise;Neuromuscular re-education;Functional mobility training;Discharge planning;Disease management/prevention;UE/LE Strength taining/ROM;Splinting/orthotics;Skin care/wound management;Functional electrical stimulation PT Recommendation Follow Up Recommendations: 24 hour supervision/assistance (HHPT vs OPPT pending progress) Patient destination: Home Equipment Recommended: To be determined Equipment Details: Pt has: rollator, BSC, tub-bench   PT Evaluation Precautions/Restrictions Precautions Precautions: Fall Restrictions Weight Bearing Restrictions: No Other Position/Activity Restrictions: R lower abdominal hematoma causing significant pain with mobility General Chart Reviewed: Yes Additional Pertinent History: COPD, HTN, HLD, obesity, Rt THA, CVA, dysarthria Family/Caregiver Present: Yes  Pain   Pt rates 7/10 R lower abdominal hematoma pain (primarily associated with mobility) on arrival, RN notified where he administered pain medication.  Home Living/Prior Functioning Home Living Available Help at Discharge: Family;Available 24 hours/day Type of Home: House Home Access: Level entry;Other (comment) (Side-door level entry at daughter's house) Home Layout: Two level Alternate Level Stairs-Number of Steps: 3 steps without rails to bed/bath Alternate Level Stairs-Rails: None Bathroom Shower/Tub: Tub/shower unit  Lives With: Daughter Prior Function Level of Independence:  Independent with basic ADLs;Independent with homemaking with ambulation;Independent with transfers;Other (comment) (community ambulator mod I with rollator. Household ambulator indep with no Ad)  Able to Take Stairs?: Yes Driving: Yes Vocation: Retired Vision/Perception  Vision - Risk analyst: Within Designer, television/film set Perception: Within Functional Limits Praxis Praxis: Intact  Cognition Overall Cognitive Status: Within Functional Limits for tasks assessed  Arousal/Alertness: Awake/alert Orientation Level: Oriented X4 Attention: Sustained;Selective;Focused Focused Attention: Appears intact Sustained Attention: Appears intact Selective Attention: Appears intact Safety/Judgment: Impaired Sensation Sensation Light Touch: Appears Intact Stereognosis: Not tested Coordination Gross Motor Movements are Fluid and Coordinated: No Fine Motor Movements are Fluid and Coordinated: Yes Finger Nose Finger Test: intact Heel Shin Test: unable to assess to due RLE pain with hip flexion. LLE intact Motor  Motor Motor: Abnormal postural alignment and control;Motor apraxia Motor - Skilled Clinical Observations: tendency to offload R hip 2/2 hemtoma pain. Delayed/effortful movement, increased time fo rmotor planning   Trunk/Postural Assessment  Cervical Assessment Cervical Assessment: Exceptions to [facility] (forward head) Thoracic Assessment Thoracic Assessment: Exceptions to [facility] (rounded shoulders) Lumbar Assessment Lumbar Assessment: Exceptions to [facility] (posterior pelvic tilt) Postural Control Postural Control: Deficits on evaluation Trunk Control: limited core facilitation with sitting/standing Protective Responses: delayed Postural Limitations: L lateral lean to offload R hip pain due to abdominal hematoma  Balance Balance Balance Assessed: Yes Dynamic Sitting Balance Sitting balance - Comments: Initially requiring minA for steadying, progressed to CGA with acclimation  to upright. Extremity Assessment  RUE Assessment RUE Assessment: Exceptions to [facility] General Strength Comments: Grossly 4/5 LUE Assessment LUE Assessment: Exceptions to [facility] General Strength Comments: Grossly 4/5 RLE Assessment RLE Assessment: Exceptions to [facility] General Strength Comments: Pain limiting volitional movement, grossly 4-/5 LLE Assessment LLE Assessment: Exceptions to [facility] General Strength Comments: Grossly 4/5  Care Tool Care Tool Bed Mobility Roll left and right activity   Roll left and right assist level: Minimal Assistance - Patient > 75%    Sit to lying activity   Sit to lying assist level: Moderate Assistance - Patient 50 - 74%    Lying to sitting edge of bed activity Lying to sitting edge of bed activity did not occur: Safety/medical concerns (HOB raised, initially unable to tolerate lowering HOB due pain from R abdominal hematoma)       Care Tool Transfers Sit to stand transfer   Sit to stand assist level: Moderate Assistance - Patient 50 - 74%    Chair/bed transfer   Chair/bed transfer assist level: Moderate Assistance - Patient 50 - 74%     Physiological scientist transfer assist level: Moderate Assistance - Patient 50 - 74%      Care Tool Locomotion Ambulation   Assist level: Minimal Assistance - Patient > 75% Assistive device: No Device Max distance: 70ft  Walk 10 feet activity   Assist level: Minimal Assistance - Patient > 75% Assistive device: No Device   Walk 50 feet with 2 turns activity Walk 50 feet with 2 turns activity did not occur: Safety/medical concerns (fatigued after 10ft)      Walk 150 feet activity Walk 150 feet activity did not occur: Safety/medical concerns      Walk 10 feet on uneven surfaces activity Walk 10 feet on uneven surfaces activity did not occur: Safety/medical concerns      Stairs Stair activity did not occur: Safety/medical concerns        Walk up/down 1 step activity Walk up/down 1 step  or curb (drop down) activity did not occur: Safety/medical concerns     Walk up/down 4 steps activity did not occuR: Safety/medical concerns  Walk up/down 4 steps activity      Walk up/down 12 steps activity Walk up/down 12 steps activity did not occur: Safety/medical concerns      Pick up small objects from  floor Pick up small object from the floor (from standing position) activity did not occur: Safety/medical concerns      Wheelchair Will patient use wheelchair at discharge?: No          Wheel 50 feet with 2 turns activity      Wheel 150 feet activity        Refer to Care Plan for Long Term Goals  SHORT TERM GOAL WEEK 1 PT Short Term Goal 1 (Week 1): Pt will perform bed mobility with minA PT Short Term Goal 2 (Week 1): Pt will perform sit<>stand transfers with LRAD and minA PT Short Term Goal 3 (Week 1): Pt will ambulate 73ft with LRAD and minA PT Short Term Goal 4 (Week 1): Pt will go up/down x4 steps with modA  Recommendations for other services: None   Skilled Therapeutic Intervention Mobility Bed Mobility Bed Mobility: Rolling Right;Rolling Left;Supine to Sit;Sit to Supine Rolling Right: Minimal Assistance - Patient > 75% (unable to reach full R sidelying due to pain from  R abdominal hematoma) Rolling Left: Minimal Assistance - Patient > 75% (HOB flat, no rails) Supine to Sit: Moderate Assistance - Patient 50-74% (HOB raised, assist for BLE management and trunk to upright. Painful. Extra time/effort required for task completion) Sit to Supine: Moderate Assistance - Patient 50-74% (Assist for BLE management, poor controlled descent with HOB flat. Painful.) Transfers Transfers: Sit to Stand;Stand to Sit;Stand Pivot Transfers Sit to Stand: Moderate Assistance - Patient 50-74%;Minimal Assistance - Patient > 75% (Initially requiring modA, progressing to minA with intervention for forward leans, foot placement, and BUE placement) Stand to Sit: Moderate Assistance -  Patient 50-74%;Minimal Assistance - Patient > 75% (Cues for safety approach and BUE's to assist with controlled lowering. Painful) Stand Pivot Transfers: Moderate Assistance - Patient 50 - 74%;Minimal Assistance - Patient > 75% (Initially requiring modA, progressing to minA. Performed x4 total throughout session from EOB<>w/c, EOB<>BSC. Cues for sequencing, stepping, and technique) Stand Pivot Transfer Details: Tactile cues for sequencing;Tactile cues for weight shifting;Verbal cues for precautions/safety;Verbal cues for technique;Verbal cues for sequencing Stand Pivot Transfer Details (indicate cue type and reason): face-to-face technique Transfer (Assistive device): None Locomotion  Gait Ambulation: Yes Gait Assistance: Moderate Assistance - Patient 50-74% Gait Distance (Feet): 10 Feet Assistive device: None Gait Gait: Yes Gait Pattern: Impaired Gait Pattern: Step-to pattern;Decreased step length - right;Decreased step length - left;Decreased hip/knee flexion - left;Decreased hip/knee flexion - right;Decreased weight shift to right;Decreased weight shift to left;Narrow base of support (posterior lean) Gait velocity: decreased Stairs / Additional Locomotion Stairs: No Wheelchair Mobility Wheelchair Mobility: No  Skilled Intervention: Pt received supine in bed, daughter at bedside, pt agreeable to PT session. Pt required modA for supine<>sit with assist for BLE and trunk management, notable R lower abdominal hematoma pain throughout mobility where RN was notified and pain med administered. Once sitting EOB, she initially required modA for stability, progressing to CGA with acclimation to upright. Demo's L lateral lean to offload R abdominal pain. Pt expressing need for voiding, performed stand<>pivot from EOB to [facility] with modA and no AD via face-to-face technique, pt successful in voiding and daughter performed pericare while pt standing with totalA. Pt performed stand<>pivot back to bed with  minA and appropriate w/c provided. Pt requiring mod/maxA for lower body dressing and minA for upper body dressing while seated EOB. Pt performed another stand<>pivot with minA to w/c and pt transported to hallway where she ambulated 31ft with modA and no AD, further gait distances  limited 2/2 fatigue and R lower abdominal pain. Pt demo's decreased B step length/height, step-to gait pattern, posterior lean, effortful. Increased work of breathing after gait, requires seated rest for recovery. Pt then performed car transfer with modA, requiring assist for bringing BLE to into/out of car. Pt transported back to her room, performed an additional stand<>pivot with minA back to bed, requiring modA for sit>supine.  Pt ended session supine in bed, bed rails up, bed alarm on, needs in reach.  Instructed pt in results of PT evaluation as detailed above, PT POC, rehab potential, rehab goals, and discharge recommendations. Additionally discussed CIR's policies regarding fall safety and use of chair alarm and/or quick release belt. Pt verbalized understanding and in agreement. Will update pt's family members as they become available.   Discharge Criteria: Patient will be discharged from PT if patient refuses treatment 3 consecutive times without medical reason, if treatment goals not met, if there is a change in medical status, if patient makes no progress towards goals or if patient is discharged from hospital.  The above assessment, treatment plan, treatment alternatives and goals were discussed and mutually agreed upon: by patient  Alger Simons PT, DPT 06/02/2020, 10:45 AM

## 2020-06-02 NOTE — Evaluation (Signed)
Occupational Therapy Assessment and Plan  Patient Details  Name: Sherry Fitzpatrick MRN: [id number] Date of Birth: 1951/02/18  OT Diagnosis: abnormal posture, acute pain and muscle weakness (generalized) Rehab Potential: Rehab Potential (ACUTE ONLY): Good ELOS: 10-14 days   Today's Date: 06/02/2020 OT Individual Time: 1300-1417 OT Individual Time Calculation (min): 62 min     Hospital Problem: Principal Problem:   Acute cerebral infarction [facility]) Active Problems:   Cerebral infarction involving left cerebellar artery (HCC)   Leukocytosis   Systolic congestive heart failure (HCC)   PAF (paroxysmal atrial fibrillation) ([place])   Past Medical History:  Past Medical History:  Diagnosis Date  . Alterations of sensations, late effect of cerebrovascular disease(438.6)   . Arthritis    "all over"  . Backache, unspecified   . Body mass index 40.0-44.9, adult ([place])   . Carpal tunnel syndrome   . Dizziness and giddiness   . Esophageal reflux   . Generalized pain   . Headache    "had alot til I had pituitary tumor removed"  . Heart murmur   . Other abnormal glucose   . Other and unspecified hyperlipidemia   . Other malaise and fatigue   . Other postprocedural status(V45.89)   . Other symptoms involving cardiovascular system   . Sleep apnea    "did test; they wanted me to get a CPAP but I never did get it" (04/14/2015) Does use inhaler at night (01 2021)  . Stroke ([place]) 06/2003; 02/2010   "minor; minor" ; denies residual on 04/14/2015  . Unspecified disorder of the pituitary gland and its hypothalamic control   . Unspecified essential hypertension   . Unspecified visual disturbance    Past Surgical History:  Past Surgical History:  Procedure Laterality Date  . CARPAL TUNNEL RELEASE Left ~ 2014  . CORONARY STENT INTERVENTION N/A 05/24/2020   Procedure: CORONARY STENT INTERVENTION;  Surgeon: Jettie Booze, MD;  Location: [facility];  Service: Cardiovascular;  Laterality:  N/A;  . JOINT REPLACEMENT    . LEFT HEART CATH AND CORONARY ANGIOGRAPHY N/A 05/24/2020   Procedure: LEFT HEART CATH AND CORONARY ANGIOGRAPHY;  Surgeon: Charolette Forward, MD;  Location: [place] [facility];  Service: Cardiovascular;  Laterality: N/A;  . TOTAL HIP ARTHROPLASTY Right 11/06/2010  . TRANSPHENOIDAL / TRANSNASAL HYPOPHYSECTOMY / RESECTION PITUITARY TUMOR  06/13/2012  . TUBAL LIGATION  1974    Assessment & Plan Clinical Impression: Patient is a 69 y.o. year old female with recent admission with history of hypertension, systolic congestive heart failure, TIA without residual weakness, diabetes mellitus, hyperlipidemia, morbid obesity with BMI of 44.24, CAD with stenting maintained on aspirin and Plavix.  History taken from chart review and patient.  Patient lives with her children.  Reportedly independent prior to admission.  1 level home 2 steps to entry.  Daughter does most of the housework and heavy meal preparation.  She presented on 05/24/2020 with chest pain and dyspnea, radiating to the left shoulder and very little relief with nitroglycerin.  EKG normal sinus rhythm with ST-T wave changes in inferior lateral leads noted to have minimally elevated high-sensitivity troponin.  Started on IV heparin and nitro.  Most recent echocardiogram with ejection fraction of 65%.  She underwent cardiac catheterization with stenting per cardiology services.  Hospital course further complicated by hypotension.  On the morning of 05/27/2020 episode of emesis severe lower abdominal pain and right groin pain relieved with tramadol.  Later in the day episode of acute altered mental status  dysarthria question seizure as well as ongoing hypotension requiring dopamine.  MRI showed patchy small volume acute ischemic infarct in the right splenium and right cerebral white matter with additional punctate acute ischemic left cerebellar infarct.  No associated hemorrhage.  MRA negative of the head.  MRA of the neck approximately  30 to 40% stenosis about the origins of both internal carotid arteries left slightly worse than right.  EEG negative for seizures.  Vascular ultrasound of groin showed no evidence of pseudoaneurysm or DVT.  Patient currently remained on aspirin Plavix for CVA prophylaxis.  She was placed on Rocephin for CAP 8/16/ 2021x2 doses.  Tolerating a mechanical soft diet with thin liquids.   Patient currently requires mod with basic self-care skills secondary to muscle weakness, decreased cardiorespiratoy endurance, decreased coordination and decreased motor planning, decreased problem solving, decreased safety awareness and delayed processing and decreased sitting balance, decreased standing balance and decreased balance strategies.  Prior to hospitalization, patient could complete ADLs with independent .  Patient will benefit from skilled intervention to decrease level of assist with basic self-care skills and increase independence with basic self-care skills prior to discharge home with care partner.  Anticipate patient will require intermittent supervision and minimal physical assistance and follow up home health.  OT - End of Session Activity Tolerance: Tolerates 10 - 20 min activity with multiple rests Endurance Deficit: Yes Endurance Deficit Description: needing intermittent breaks due to fatigue and pain in right low abdomen OT Assessment Rehab Potential (ACUTE ONLY): Good OT Patient demonstrates impairments in the following area(s): Balance;Safety;Cognition;Endurance;Motor;Pain OT Basic ADL's Functional Problem(s): Bathing;Dressing;Toileting OT Advanced ADL's Functional Problem(s): Simple Meal Preparation OT Transfers Functional Problem(s): Toilet;Tub/Shower OT Plan OT Intensity: Minimum of 1-2 x/day, 45 to 90 minutes OT Frequency: 5 out of 7 days OT Duration/Estimated Length of Stay: 10-14 days OT Treatment/Interventions: Balance/vestibular training;Discharge planning;Pain management;Self  Care/advanced ADL retraining;Therapeutic Activities;UE/LE Coordination activities;Cognitive remediation/compensation;Functional mobility training;Patient/family education;Therapeutic Exercise;DME/adaptive equipment instruction;Neuromuscular re-education;UE/LE Strength taining/ROM OT Basic Self-Care Anticipated Outcome(s): supervision OT Toileting Anticipated Outcome(s): supervision OT Bathroom Transfers Anticipated Outcome(s): supervision OT Recommendation Patient destination: Home Follow Up Recommendations: Other (comment);Home health OT (intermittent supervision) Equipment Recommended: To be determined   OT Evaluation Precautions/Restrictions  Precautions Precautions: Fall Precaution Comments: monitor BP; ensure medicated prior to treatment Restrictions Weight Bearing Restrictions: No Other Position/Activity Restrictions: R lower abdominal hematoma causing significant pain with mobility Pain Pain Assessment Pain Scale: 0-10 Pain Score: 7  Pain Type: Acute pain;Surgical pain Pain Location: Abdomen Pain Orientation: Right Pain Descriptors / Indicators: Sharp;Shooting;Stabbing Pain Onset: Sudden Patients Stated Pain Goal: 2 Pain Intervention(s): Repositioned;RN made aware;Distraction Multiple Pain Sites: No Home Living/Prior Functioning Home Living Family/patient expects to be discharged to:: Private residence Living Arrangements: Children Available Help at Discharge: Family, Available 24 hours/day Type of Home: House Home Access: Level entry, Other (comment) (side door level entry at dtr's house) Entrance Stairs-Number of Steps: 2 Home Layout: Two level Alternate Level Stairs-Number of Steps: 3 steps without rails to bed/bath Alternate Level Stairs-Rails: None Bathroom Shower/Tub: Optometrist: Yes  Lives With: Daughter Prior Function Level of Independence: Independent with basic ADLs, Independent with homemaking with  ambulation, Independent with transfers, Other (comment)  Able to Take Stairs?: Yes Driving: Yes Vocation: Retired Surveyor, mining Baseline Vision/History: Wears glasses Wears Glasses: Reading only Patient Visual Report: No change from baseline Vision Assessment?: No apparent visual deficits Eye Alignment: Within Functional Limits Ocular Range of Motion: Within Functional Limits Alignment/Gaze Preference: Within Defined Limits Tracking/Visual Pursuits:  Able to track stimulus in all quads without difficulty Perception  Perception: Within Functional Limits Praxis Praxis: Intact Cognition Overall Cognitive Status: Within Functional Limits for tasks assessed Arousal/Alertness: Awake/alert Orientation Level: Person;Place;Situation Person: Oriented Place: Oriented Situation: Oriented Year: 2021 Month: August Day of Week: Correct Memory: Appears intact Memory Impairment: Retrieval deficit;Decreased recall of new information Decreased Short Term Memory: Verbal complex;Verbal basic;Functional basic Immediate Memory Recall: Sock;Blue;Bed Memory Recall Sock: Without Cue Memory Recall Blue: Without Cue Memory Recall Bed: Without Cue Attention: Alternating Focused Attention: Appears intact Sustained Attention: Appears intact Selective Attention: Appears intact Alternating Attention Impairment: Verbal complex;Functional complex Awareness: Impaired Awareness Impairment: Anticipatory impairment Problem Solving: Appears intact Problem Solving Impairment: Verbal complex;Functional complex Executive Function: Self Monitoring;Organizing Organizing: Impaired Organizing Impairment: Verbal complex Self Monitoring: Impaired Self Monitoring Impairment: Verbal complex Safety/Judgment: Impaired Sensation Sensation Light Touch: Appears Intact Hot/Cold: Appears Intact Proprioception: Appears Intact Stereognosis: Not tested Coordination Gross Motor Movements are Fluid and Coordinated: No Fine Motor  Movements are Fluid and Coordinated: Yes Motor  Motor Motor: Abnormal postural alignment and control;Motor apraxia Motor - Skilled Clinical Observations: tendency to offload R hip 2/2 hemtoma pain. Delayed/effortful movement, increased time fo rmotor planning  Trunk/Postural Assessment  Cervical Assessment Cervical Assessment: Exceptions to [facility] (forward head slightly) Thoracic Assessment Thoracic Assessment: Exceptions to [facility] (slight rounded shoulders) Lumbar Assessment Lumbar Assessment: Exceptions to [facility] (mild posterior pelvic tilt) Postural Control Protective Responses: delayed Postural Limitations: L lateral lean to offload R hip pain due to abdominal hematoma  Balance Balance Balance Assessed: Yes Dynamic Sitting Balance Sitting balance - Comments: Initially requiring minA for steadying, progressed to CGA with acclimation to upright. Extremity/Trunk Assessment RUE Assessment RUE Assessment: Exceptions to [facility] General Strength Comments: Grossly 4/5 LUE Assessment LUE Assessment: Exceptions to [facility] General Strength Comments: Grossly 4/5  Care Tool Care Tool Self Care Eating   Eating Assist Level: Independent    Oral Care    Oral Care Assist Level: Set up assist    Bathing   Body parts bathed by patient: Right arm;Left arm;Chest;Abdomen;Face Body parts bathed by helper: Front perineal area;Buttocks   Assist Level: Moderate Assistance - Patient 50 - 74%    Upper Body Dressing(including orthotics)   What is the patient wearing?: Pull over shirt   Assist Level: Minimal Assistance - Patient > 75%    Lower Body Dressing (excluding footwear)   What is the patient wearing?: Incontinence brief Assist for lower body dressing: Maximal Assistance - Patient 25 - 49%    Putting on/Taking off footwear   What is the patient wearing?: Non-skid slipper socks Assist for footwear: Total Assistance - Patient < 25%       Care Tool Toileting Toileting activity   Assist for  toileting: Total Assistance - Patient < 25%     Care Tool Bed Mobility Roll left and right activity   Roll left and right assist level: Minimal Assistance - Patient > 75%    Sit to lying activity   Sit to lying assist level: Minimal Assistance - Patient > 75%    Lying to sitting edge of bed activity   Lying to sitting edge of bed assist level: Moderate Assistance - Patient 50 - 74%     Care Tool Transfers Sit to stand transfer   Sit to stand assist level: Moderate Assistance - Patient 50 - 74%    Chair/bed transfer   Chair/bed transfer assist level: Moderate Assistance - Patient 50 - 74%     Toilet transfer  Assist Level: Minimal Assistance - Patient > 75%     Care Tool Cognition Expression of Ideas and Wants Expression of Ideas and Wants: Some difficulty - exhibits some difficulty with expressing needs and ideas (e.g, some words or finishing thoughts) or speech is not clear   Understanding Verbal and Non-Verbal Content Understanding Verbal and Non-Verbal Content: Understands (complex and basic) - clear comprehension without cues or repetitions   Memory/Recall Ability *first 3 days only Memory/Recall Ability *first 3 days only: Current season;Staff names and faces;That he or she is in a hospital/hospital unit;Location of own room    Refer to Care Plan for Long Term Goals  SHORT TERM GOAL WEEK 1 OT Short Term Goal 1 (Week 1): Pt will dress LB using AE as needed with min assist OT Short Term Goal 2 (Week 1): Pt will complete toilet transfer with CGA OT Short Term Goal 3 (Week 1): Pt will complete LB bathing using AE as needed with min assist  Recommendations for other services: Surveyor, mining group and Stress management   Skilled Therapeutic Intervention ADL ADL Eating: Independent Where Assessed-Eating: Bed level Grooming: Setup Where Assessed-Grooming: Sitting at sink Upper Body Bathing: Minimal assistance Where Assessed-Upper Body Bathing: Sitting  at sink Lower Body Bathing: Moderate assistance Where Assessed-Lower Body Bathing: Sitting at sink Upper Body Dressing: Minimal assistance Where Assessed-Upper Body Dressing: Bed level Lower Body Dressing: Maximal assistance Where Assessed-Lower Body Dressing: Bed level Toileting: Minimal assistance Where Assessed-Toileting: Bedside Commode Toilet Transfer: Minimal assistance Toilet Transfer Method: Stand pivot Toilet Transfer Equipment: Bedside commode Mobility  Bed Mobility Bed Mobility: Rolling Right;Rolling Left;Supine to Sit Rolling Right: Minimal Assistance - Patient > 75% Rolling Left: Minimal Assistance - Patient > 75% Supine to Sit: Moderate Assistance - Patient 50-74% Transfers Sit to Stand: Minimal Assistance - Patient > 75% Stand to Sit: Minimal Assistance - Patient > 75%  OT Skilled Intervention:  Pt being assisted by nurse tech for toileting at bed level due to incontinence episode of urine upon OT arrival.  OT facilitated increased pt participation during bed mobility at above levels and toileting completed as stated above.  Pt limited by severe pain in right lower abdomen despite RN recently administering pain medication throughout session.  Agreeable to OT session despite pain.  Pt completed UB bathing and dressing sinkside per above levels of assist required.  Pt reporting another urgent need to urinate at end of session, nursing present to assist at OT departure.    Discharge Criteria: Patient will be discharged from OT if patient refuses treatment 3 consecutive times without medical reason, if treatment goals not met, if there is a change in medical status, if patient makes no progress towards goals or if patient is discharged from hospital.  The above assessment, treatment plan, treatment alternatives and goals were discussed and mutually agreed upon: by patient  Ezekiel Slocumb 06/02/2020, 5:23 PM

## 2020-06-02 NOTE — Progress Notes (Signed)
[facility] [facility] PHYSICAL MEDICINE & REHABILITATION PROGRESS NOTE  Subjective/Complaints: Patient seen sitting up in bed this morning.  She states she slept fairly overnight.  She states she was a little bit restless.  She states she is ready to begin therapies.  ROS: + Abdominal pain. Denies CP, SOB, N/V/D  Objective: Vital Signs: Blood pressure (!) 185/72, pulse 78, temperature 99.7 F (37.6 C), resp. rate 18, height 5\' 4"  (1.626 m), weight 116.8 kg, SpO2 93 %. No results found. Recent Labs    05/31/20 0335  WBC 16.3*  HGB 9.3*  HCT 28.4*  PLT 246   Recent Labs    05/31/20 0335 06/01/20 1412  NA 142 142  K 3.5 3.6  CL 108 106  CO2 24 27  GLUCOSE 121* 117*  BUN 22 14  CREATININE 1.14* 1.05*  CALCIUM 8.3* 8.3*    Physical Exam: BP (!) 185/72 (BP Location: Right Arm)   Pulse 78   Temp 99.7 F (37.6 C)   Resp 18   Ht 5\' 4"  (1.626 m)   Wt 116.8 kg   SpO2 93%   BMI 44.20 kg/m  Constitutional: No distress . Vital signs reviewed.  Morbidly obese. HENT: Normocephalic.  Atraumatic. Eyes: EOMI. No discharge. Cardiovascular: No JVD.  RRR. Respiratory: Normal effort.  No stridor.  Bilateral clear to auscultation. GI: Non-distended.  Abdominal tenderness.  BS +. Skin: Warm and dry.  Intact. Psych: Normal mood.  Normal behavior. Musc: No edema in extremities.  No tenderness in extremities. Neuro: Alert and oriented x3 Dysarthria Motor: 4+/5 throughout   Assessment/Plan: 1. Functional deficits secondary to bilateral cerebral/cerebellar infarcts after cardiac cath which require 3+ hours per day of interdisciplinary therapy in a comprehensive inpatient rehab setting.  Physiatrist is providing close team supervision and 24 hour management of active medical problems listed below.  Physiatrist and rehab team continue to assess barriers to discharge/monitor patient progress toward functional and medical goals  Care Tool:  Bathing        Body parts bathed by helper: Front  perineal area, Buttocks, Left lower leg, Right lower leg     Bathing assist Assist Level: Total Assistance - Patient < 25%     Upper Body Dressing/Undressing Upper body dressing        Upper body assist      Lower Body Dressing/Undressing Lower body dressing            Lower body assist       Toileting Toileting    Toileting assist Assist for toileting: Moderate Assistance - Patient 50 - 74%     Transfers Chair/bed transfer  Transfers assist           Locomotion Ambulation   Ambulation assist              Walk 10 feet activity   Assist           Walk 50 feet activity   Assist           Walk 150 feet activity   Assist           Walk 10 feet on uneven surface  activity   Assist           Wheelchair     Assist               Wheelchair 50 feet with 2 turns activity    Assist            Wheelchair 150 feet activity  Assist            Medical Problem List and Plan: 1.  Altered mental status with dysarthria secondary to bilateral cerebral/cerebellar after cardiac catheterization/PAF  Begin CIR evaluations 2.  Antithrombotics: -DVT/anticoagulation: SCDs             -antiplatelet therapy: Aspirin 81 mg daily and Plavix 75 mg daily 3. Pain Management: Tylenol as needed 4. Mood: Provide emotional support             -antipsychotic agents: N/A 5. Neuropsych: This patient is capable of making decisions on her own behalf. 6. Skin/Wound Care: Routine skin checks 7. Fluids/Electrolytes/Nutrition: Routine in and outs. 8.  Hypotension with atrial fibrillation. Low dose Norvasc 2.5 mg daily. Continue amiodarone 100 mg daily.    Monitor with increased mobility 9.  Diabetes mellitus with hyperglycemia.  Hemoglobin A1c 7.3.  NovoLog 4 units every 4 hours.  Check blood sugars as directed.  Monitoring with increased mobility 10.  Systolic congestive heart failure.Imdur 30 mg daily.  Monitor for any  signs of fluid overload Filed Weights   06/01/20 1647  Weight: 116.8 kg  11.  Morbid obesity.  BMI 44.24.  Dietary follow-up.  Encourage weight loss 12.  Hyperlipidemia.  Lipitor 13.  CAP.  IV Rocephin completed.  Check oxygen saturations every shift 14.  Post stroke dysphagia:   D3 thins  Advance diet as tolerated 15.  Leukocytosis  WBCs 16.3 on 8/17, labs pending  Afebrile LOS: 1 days A FACE TO FACE EVALUATION WAS PERFORMED  Sherry Fitzpatrick Lorie Phenix 06/02/2020, 9:00 AM

## 2020-06-02 NOTE — Progress Notes (Signed)
[facility] Individual Statement of Services  Patient Name:  Sherry Fitzpatrick  Date:  06/02/2020  Welcome to the [facility].  Our goal is to provide you with an individualized program based on your diagnosis and situation, designed to meet your specific needs.  With this comprehensive rehabilitation program, you will be expected to participate in at least 3 hours of rehabilitation therapies Monday-Friday, with modified therapy programming on the weekends.  Your rehabilitation program will include the following services:  Physical Therapy (PT), Occupational Therapy (OT), Speech Therapy (ST), 24 hour per day rehabilitation nursing, Neuropsychology, Care Coordinator, Rehabilitation Medicine, Nutrition Services and Pharmacy Services  Weekly team conferences will be held on Wednesday to discuss your progress.  Your Inpatient Rehabilitation Care Coordinator will talk with you frequently to get your input and to update you on team discussions.  Team conferences with you and your family in attendance may also be held.  Expected length of stay: 10-14 days  Overall anticipated outcome: supervision with cueing  Depending on your progress and recovery, your program may change. Your Inpatient Rehabilitation Care Coordinator will coordinate services and will keep you informed of any changes. Your Inpatient Rehabilitation Care Coordinator's name and contact numbers are listed  below.  The following services may also be recommended but are not provided by the [facility] will be made to provide these services after discharge if needed.  Arrangements include referral to agencies that provide these services.  Your insurance has been verified to be:  [employer] Your primary doctor is:  Sela Hilding  Pertinent information will  be shared with your doctor and your insurance company.  Inpatient Rehabilitation Care Coordinator:  Ovidio Kin, [place]  Information discussed with and copy given to patient by: Elease Hashimoto, 06/02/2020, 11:40 AM

## 2020-06-02 NOTE — Evaluation (Signed)
Speech Language Pathology Assessment and Plan  Patient Details  Name: Sherry Fitzpatrick MRN: [id number] Date of Birth: [birth year]  SLP Diagnosis: Voice disorder;Cognitive Impairments;Speech and Language deficits  Rehab Potential: Good ELOS: 10-14 days    Today's Date: 06/02/2020 SLP Individual Time: 1100-1200 SLP Individual Time Calculation (min): 60 min   Hospital Problem: Principal Problem:   Acute cerebral infarction [facility]) Active Problems:   Cerebral infarction involving left cerebellar artery (HCC)   Leukocytosis   Systolic congestive heart failure (HCC)   PAF (paroxysmal atrial fibrillation) ([place])  Past Medical History:  Past Medical History:  Diagnosis Date  . Alterations of sensations, late effect of cerebrovascular disease(438.6)   . Arthritis    "all over"  . Backache, unspecified   . Body mass index 40.0-44.9, adult ([place])   . Carpal tunnel syndrome   . Dizziness and giddiness   . Esophageal reflux   . Generalized pain   . Headache    "had alot til I had pituitary tumor removed"  . Heart murmur   . Other abnormal glucose   . Other and unspecified hyperlipidemia   . Other malaise and fatigue   . Other postprocedural status(V45.89)   . Other symptoms involving cardiovascular system   . Sleep apnea    "did test; they wanted me to get a CPAP but I never did get it" (04/14/2015) Does use inhaler at night (01 2021)  . Stroke ([place]) 06/2003; 02/2010   "minor; minor" ; denies residual on 04/14/2015  . Unspecified disorder of the pituitary gland and its hypothalamic control   . Unspecified essential hypertension   . Unspecified visual disturbance    Past Surgical History:  Past Surgical History:  Procedure Laterality Date  . CARPAL TUNNEL RELEASE Left ~ 2014  . CORONARY STENT INTERVENTION N/A 05/24/2020   Procedure: CORONARY STENT INTERVENTION;  Surgeon: Jettie Booze, MD;  Location: [facility];  Service: Cardiovascular;  Laterality: N/A;  . JOINT  REPLACEMENT    . LEFT HEART CATH AND CORONARY ANGIOGRAPHY N/A 05/24/2020   Procedure: LEFT HEART CATH AND CORONARY ANGIOGRAPHY;  Surgeon: Charolette Forward, MD;  Location: [facility];  Service: Cardiovascular;  Laterality: N/A;  . TOTAL HIP ARTHROPLASTY Right 11/06/2010  . TRANSPHENOIDAL / TRANSNASAL HYPOPHYSECTOMY / RESECTION PITUITARY TUMOR  06/13/2012  . TUBAL LIGATION  1974    Assessment / Plan / Recommendation  Clinical Impression      Patient is a 69 year old right-handed female with history of hypertension, systolic congestive heart failure, TIA without residual weakness, diabetes mellitus, hyperlipidemia, morbid obesity with BMI of 44.24, CAD with stenting maintained on aspirin and Plavix. History taken from chart review and patient. Patient lives with her children. Reportedly independent prior to admission. 1 level home 2 steps to entry. Daughter does most of the housework and heavy meal preparation. She presented on 05/24/2020 with chest pain and dyspnea, radiating to the left shoulder and very little relief with nitroglycerin. EKG normal sinus rhythm with ST-T wave changes in inferior lateral leads noted to have minimally elevated high-sensitivity troponin. Started on IV heparin and nitro. Most recent echocardiogram with ejection fraction of 65%. She underwent cardiac catheterization with stenting per cardiology services. Hospital course further complicated by hypotension. On the morning of 05/27/2020 episode of emesis severe lower abdominal pain and right groin pain relieved with tramadol. Later in the day episode of acute altered mental status dysarthria question seizure as well as ongoing hypotension requiring dopamine. MRI showed patchy small volume acute  ischemic infarct in the right splenium and right cerebral white matter with additional punctate acute ischemic left cerebellar infarct. No associated hemorrhage. MRA negative of the head. MRA of the neck approximately 30  to 40% stenosis about the origins of both internal carotid arteries left slightly worse than right. EEG negative for seizures. Vascular ultrasound of groin showed no evidence of pseudoaneurysm or DVT. Patient currently remained on aspirin Plavix for CVA prophylaxis. She was placed on Rocephin for CAP 8/16/ 2021x2 doses. Tolerating a mechanical soft diet with thin liquids. Therapy evaluations completed and patient was admitted for a comprehensive rehab program. Patient transferred to CIR on 06/01/2020 .    Patient presents with a mild cognitive-linguistic disorder and a mild voice/speech disorder, but swallow function is [facility]. Patient did not exhibit any overt s/s of aspiration or penetration with regular solids and thin liquids. Patient did exhibit a voice that was lower in vocal intensity than her self-reported baseline and mildly hoarse. She does have history of GERD and exhibited belching during session. Her respiratory function with room air appeared slightly labored and she would become SOB during conversation while lying in bed. Patient reported that her memory was not as good as it had been. She did demonstrate a mild decreased delayed recall during testing. During verbal problem solving and reasoning, she was inconsistent, such as when responding "there's nothing similar" when asked to name similarity between a ruler and a watch, but then responding to all other questions in this section correctly without hesitation. Patient would benefit from short term skilled speech-language and cognitive therapy to maximize function prior to discharge.   Skilled Therapeutic Interventions          Bedside swallow evaluation, cognitive-linguistic evaluation  SLP Assessment  Patient will need skilled [facility] during CIR admission    Recommendations  Patient destination: Home Follow up Recommendations: Other (comment) (TBD, likely none) Equipment Recommended: None recommended by SLP     SLP Frequency 3 to 5 out of 7 days   SLP Duration  SLP Intensity  SLP Treatment/Interventions 10-14 days  Minumum of 1-2 x/day, 30 to 90 minutes  Cognitive remediation/compensation;Speech/Language facilitation;Therapeutic Exercise;Patient/family education;Functional tasks    Pain Pain Assessment Pain Scale: 0-10 Pain Score: 6  Pain Type: Acute pain;Surgical pain Pain Location: Abdomen Pain Orientation: Right Pain Descriptors / Indicators: Sharp;Shooting Pain Onset: Sudden Patients Stated Pain Goal: 2 Pain Intervention(s): RN made aware  Prior Functioning Cognitive/Linguistic Baseline: Within functional limits Type of Home: House  Lives With: Daughter Available Help at Discharge: Family;Available 24 hours/day Vocation: Retired  Programmer, systems Overall Cognitive Status: Impaired/Different from baseline Arousal/Alertness: Awake/alert Orientation Level: Oriented X4 Attention: Alternating Focused Attention: Appears intact Sustained Attention: Appears intact Selective Attention: Appears intact Alternating Attention: Impaired Alternating Attention Impairment: Verbal complex;Functional complex Memory: Impaired Memory Impairment: Retrieval deficit;Decreased recall of new information Decreased Short Term Memory: Verbal complex;Verbal basic;Functional basic Awareness: Impaired Awareness Impairment: Anticipatory impairment Problem Solving: Impaired Problem Solving Impairment: Verbal complex;Functional complex Executive Function: Self Monitoring;Organizing Organizing: Impaired Organizing Impairment: Verbal complex Self Monitoring: Impaired Self Monitoring Impairment: Verbal complex Safety/Judgment: Impaired  Comprehension Auditory Comprehension Overall Auditory Comprehension: Appears within functional limits for tasks assessed Expression Expression Primary Mode of Expression: Verbal Verbal Expression Overall Verbal Expression: Appears within functional  limits for tasks assessed Oral Motor Oral Motor/Sensory Function Overall Oral Motor/Sensory Function: Within functional limits Motor Speech Overall Motor Speech: Impaired Respiration: Impaired Level of Impairment: Phrase Phonation: Low vocal intensity Resonance: Within functional limits Articulation:  Within functional limitis Intelligibility: Intelligible Motor Planning: Witnin functional limits Motor Speech Errors: Not applicable Effective Techniques: Increased vocal intensity;Slow rate  Care Tool Care Tool Cognition Expression of Ideas and Wants Expression of Ideas and Wants: Some difficulty - exhibits some difficulty with expressing needs and ideas (e.g, some words or finishing thoughts) or speech is not clear   Understanding Verbal and Non-Verbal Content Understanding Verbal and Non-Verbal Content: Understands (complex and basic) - clear comprehension without cues or repetitions   Memory/Recall Ability *first 3 days only Memory/Recall Ability *first 3 days only: Current season;Staff names and faces;That he or she is in a hospital/hospital unit;Location of own room     PMSV Assessment  PMSV Trial Intelligibility: Intelligible  Bedside Swallowing Assessment General Date of Onset: 06/01/20 Previous Swallow Assessment: BSE Diet Prior to this Study: Dysphagia 3 (soft);Thin liquids Temperature Spikes Noted: No Respiratory Status: Room air History of Recent Intubation: Yes Length of Intubations (days): 2 days Date extubated: 05/29/20 Behavior/Cognition: Alert;Cooperative;Pleasant mood Oral Cavity - Dentition: Missing dentition;Dentures, top Self-Feeding Abilities: Able to feed self Patient Positioning: Upright in bed Baseline Vocal Quality: Low vocal intensity;Hoarse Volitional Cough: Strong Volitional Swallow: Able to elicit  Oral Care Assessment   Ice Chips   Thin Liquid Thin Liquid: Within functional limits Presentation: Cup;Self Fed Other Comments: No overt s/s  of aspiration or penetration observed Nectar Thick   Honey Thick   Puree   Solid Solid: Within functional limits BSE Assessment Suspected Esophageal Findings Suspected Esophageal Findings: Belching Risk for Aspiration Impact on safety and function: Mild aspiration risk Other Related Risk Factors: History of GERD;Deconditioning;Prolonged intubation;History of dysphagia  Short Term Goals: Week 1: SLP Short Term Goal 1 (Week 1): Patient will return demonstrate to perform EMST exercises to improve vocal intensity with min A. SLP Short Term Goal 2 (Week 1): Patient will demonstrate recall of complex medical information/interventions during semi-structured conversations with clinician with min A SLP Short Term Goal 3 (Week 1): Patient will perform complex functional problem solving tasks (medication and money managment, etc) with min A . SLP Short Term Goal 4 (Week 1): Patient will utilize strategies to improve vocal and speech function with supervision A.  Refer to Care Plan for Long Term Goals  Recommendations for other services: None   Discharge Criteria: Patient will be discharged from SLP if patient refuses treatment 3 consecutive times without medical reason, if treatment goals not met, if there is a change in medical status, if patient makes no progress towards goals or if patient is discharged from hospital.  The above assessment, treatment plan, treatment alternatives and goals were discussed and mutually agreed upon: by patient  Sonia Baller, MA, CCC-SLP Speech Therapy

## 2020-06-02 NOTE — Progress Notes (Signed)
Inpatient Rehabilitation  Patient information reviewed and entered into eRehab system by Giovani Neumeister M. Elmire Amrein, M.A., CCC/SLP, PPS Coordinator.  Information including medical coding, functional ability and quality indicators will be reviewed and updated through discharge.    

## 2020-06-02 NOTE — Discharge Summary (Signed)
NAME: Sherry, Fitzpatrick MEDICAL RECORD XA:[id number] ACCOUNT [id number] DATE OF BIRTH:1951/05/30 FACILITY: [facility] LOCATION: [facility] PHYSICIAN:Kimm Sider Daivd Council, MD  DISCHARGE SUMMARY  DATE OF DISCHARGE:  06/01/2020  ADMITTING DIAGNOSES: 1.  Acute non-ST elevation myocardial infarction. 2.  Uncontrolled hypertension. 3.  Type 2 diabetes mellitus. 4.  Hyperlipidemia. 5.  History of transient ischemic attack/cerebrovascular accident in the past. 6.  Morbid obesity. 7.  Obstructive sleep apnea. 8.  History of gouty arthritis.  FINAL DIAGNOSES: 1.  Status post acute non-ST elevation myocardial infarction, status post left cardiac catheterization/percutaneous transluminal coronary angioplasty stenting to mid left anterior descending. 2.  Status post hypotensive shock secondary to large anterior abdominal wall hematoma. 3.  Status post acute ischemic cerebrovascular accident in the setting of hypotension and chronic microvascular disease.  Cannot rule out cardioembolic source in view of paroxysmal atrial fibrillation, status post paroxysmal atrial fibrillation, now  converted back into normal sinus rhythm. 4.  Status post acute respiratory failure. 5.  Marked leukocytosis, improved. 6.  Gastroesophageal reflux disease. 7.  Acute blood loss anemia secondary to large anterior abdominal wall hematoma, which is stable. 8.  Status post acute kidney injury secondary to hypotension/hypoperfusion which is improved. 9.  Hypertension. 10.  Diabetes mellitus. 11.  History of transient ischemic attack/cerebrovascular accident in the past. 43.  Morbid obesity. 12.  Obstructive sleep apnea. 13.  History of gouty arthritis.  DISCHARGE MEDICATIONS:  To the cardiac rehab:    1.  Maalox 30 mL every 4 hours as needed. 2.  Amiodarone 200 mg half tablet daily. 3.  Ferrous sulfate 325 mg 1 tablet 3 times daily. 4.  Nitrostat 0.4 mg sublingual p.r.n. 5.  Pantoprazole 40 mg 1 tablet daily. 6.  MiraLax 17 g  daily as needed. 7.  Tylenol 1000 mg every 6 hours as needed. 8.  Aspirin 81 mg daily. 9.  Symbicort 2 puffs twice daily. 10.  Carvedilol 6.25 mg 2 times daily. 11.  Clopidogrel 75 mg 1 tablet daily. 12.  Albuterol inhaler every 6 hours as needed. 13.  Amlodipine 5 mg daily. 14.  Atorvastatin 80 mg daily. 15.  Furosemide 40 mg daily. 16.  DuoNeb every 4 hours as needed. 17.  Metformin 500 mg daily. 18.  Potassium chloride 20 mEq daily. 19.  Valsartan 160 mg daily.    The patient has been advised to stop clonidine, Nasonex and hydralazine.  DIET:  Low salt, low cholesterol, 1800 calories ADA diet.  FOLLOWUP:  The patient will be transferred to inpatient rehab.  CONDITION AT DISCHARGE:  Stable.  FOLLOWUP:  Follow up with me in 1 week after discharge to home.  CONDITION AT DISCHARGE:  Stable.  BRIEF HISTORY AND HOSPITAL COURSE:  The patient is a 69 year old female with past medical history significant for hypertension, hyperlipidemia, type 2 diabetes mellitus, morbid obesity, obstructive sleep apnea, degenerative joint disease, history of  TIA/CVA in the past.  She came to the ER by EMS complaining of retrosternal chest pain described as pressure, tightness which woke her up around 3:30 a.m.  States the pain was 10/10, radiating to left shoulder and left arm, associated with diaphoresis  and mild shortness of breath.  The patient received aspirin and sublingual nitro with partial relief.  EKG done in the ED showed normal sinus rhythm with ST-T wave changes in inferolateral leads and the patient was noted to have minimally elevated high  sensitivity troponin I.  The patient was started on IV heparin and nitro with marked improvement in her  chest pain.  The patient denies any palpitation, lightheadedness or syncope.  Denies such episodes of severe chest pain in the past.  Denies any  cough, fever or chills.  The patient had CT FFR, which showed significant LAD and RCA stenosis in the past.   The patient opted for medical management.  PHYSICAL EXAMINATION: GENERAL:  She was alert, awake, oriented x3. VITAL SIGNS:  Blood pressure was 196/98, pulse 62.  She was afebrile. HEENT:  Conjunctivae pink. NECK:  Supple, no JVD. LUNGS:  Decreased breath sound at bases. CARDIOVASCULAR:  S1, S2 was normal.  There was S4 gallop. ABDOMEN:  Soft, obese, nontender. EXTREMITIES:  There is no clubbing, cyanosis or edema.  LABORATORY DATA:  Her admission labs:  Sodium was 139, potassium 3.4, glucose 173, BUN 16, creatinine 1.15.  Hemoglobin was 12.2, hematocrit 37.5, white count of 7.2.  High-sensitivity troponin I was 350 and 467.  Her BNP was slightly elevated 213.  EKG  showed normal sinus rhythm with ST-T wave changes in lateral leads.  Chest x-ray showed patchy interstitial and airspace opacities bilaterally.  Differential diagnosis includes edema versus infection.  On 08/13, her sodium was 140, potassium 3.9, glucose  145, BUN 24, creatinine 1.37, hemoglobin was 7.9, hematocrit 24.4, white count of 15.7.  On 07/14, her hemoglobin was 7.3, hematocrit 23.4, white count of 14.8, BUN 29, creatinine 2.37.  Her ABGs on 08/13, pH 7.28, pCO2 of 47, pO2 of 78.  Repeat ABGs:   7.35, pCO2 of 36, pO2 193 on 100% O2 on the vent.  Her last labs:  Sodium was 142, potassium 3.5, BUN 22, creatinine 1.14, which has been back to baseline.  Hemoglobin 9.3, hematocrit 28.4, which has been stable.  White count is mildly elevated, but is  trending down.  BRIEF HOSPITAL COURSE:  The patient was admitted to ICU.  The patient ruled in for small non-ST elevation myocardial infarction.  The patient subsequently underwent left cardiac catheterization by me and followed by PTCA/stenting to mid LAD by Dr. ____  with intravascular ultrasound guidance with excellent angiographic results.  The patient tolerated the procedure well.  Post-procedure, the patient had lower abdominal wall pain, subsequently had episode of lower abdominal  pain with marked reduction in  hemoglobin and hematocrit.  CT of the abdomen was obtained which showed a large anterior abdominal wall hematoma.  The patient also had a duplex ultrasound of the right groin, which showed no evidence of right groin hematoma, AV fistula or  pseudoaneurysm.  The patient on 08/13 developed further abdominal pain and suddenly became hypotensive and unresponsive, followed by a seizure episode.  Code stroke was called.  The patient was successfully resuscitated and was intubated on the floor.   The patient did not require any CPR and was transferred to intensive care unit.  CT of the brain showed no evidence of acute stroke with small vessel microvascular disease.  MRI of the brain showed ischemic right CVA.  The patient was successfully  extubated 2 days later.  Her neuro symptoms were completely resolved.  The patient did not have any episodes of further seizure activity during the hospital stay.  The patient did require multiple units of packed RBC transfusion for large anterior  abdominal wall hematoma, which has been stable.  OT, PT consult and speech therapy consult were obtained and recommendation was for inpatient rehab.  DISCHARGE INSTRUCTIONS:  The patient will be transferred to inpatient rehabilitation and will be followed up in my office after discharge  in 1 week.  VN/NUANCE D:06/01/2020 T:06/01/2020 JOB:[id number]

## 2020-06-03 ENCOUNTER — Inpatient Hospital Stay (HOSPITAL_COMMUNITY): Payer: Medicare HMO

## 2020-06-03 ENCOUNTER — Inpatient Hospital Stay (HOSPITAL_COMMUNITY): Payer: Medicare HMO | Admitting: Occupational Therapy

## 2020-06-03 ENCOUNTER — Inpatient Hospital Stay (HOSPITAL_COMMUNITY): Payer: Medicare HMO | Admitting: Speech Pathology

## 2020-06-03 DIAGNOSIS — E46 Unspecified protein-calorie malnutrition: Secondary | ICD-10-CM

## 2020-06-03 DIAGNOSIS — E876 Hypokalemia: Secondary | ICD-10-CM

## 2020-06-03 DIAGNOSIS — I5021 Acute systolic (congestive) heart failure: Secondary | ICD-10-CM

## 2020-06-03 DIAGNOSIS — E8809 Other disorders of plasma-protein metabolism, not elsewhere classified: Secondary | ICD-10-CM

## 2020-06-03 DIAGNOSIS — R0989 Other specified symptoms and signs involving the circulatory and respiratory systems: Secondary | ICD-10-CM

## 2020-06-03 DIAGNOSIS — D62 Acute posthemorrhagic anemia: Secondary | ICD-10-CM

## 2020-06-03 LAB — GLUCOSE, CAPILLARY
Glucose-Capillary: 122 mg/dL — ABNORMAL HIGH (ref 70–99)
Glucose-Capillary: 146 mg/dL — ABNORMAL HIGH (ref 70–99)
Glucose-Capillary: 89 mg/dL (ref 70–99)
Glucose-Capillary: 141 mg/dL — ABNORMAL HIGH (ref 70–99)

## 2020-06-03 MED ORDER — AMLODIPINE BESYLATE 5 MG PO TABS
5.0000 mg | ORAL_TABLET | Freq: Every day | ORAL | Status: DC
  Administered 2020-06-04 – 2020-06-06 (×3): 5 mg via ORAL
  Filled 2020-06-03 (×3): qty 1

## 2020-06-03 MED ORDER — AMLODIPINE BESYLATE 2.5 MG PO TABS
2.5000 mg | ORAL_TABLET | Freq: Once | ORAL | Status: AC
  Administered 2020-06-03: 2.5 mg via ORAL
  Filled 2020-06-03: qty 1

## 2020-06-03 MED ORDER — PROSOURCE PLUS PO LIQD
30.0000 mL | Freq: Two times a day (BID) | ORAL | Status: DC
  Administered 2020-06-03 – 2020-06-09 (×13): 30 mL via ORAL
  Filled 2020-06-03 (×15): qty 30

## 2020-06-03 MED ORDER — POTASSIUM CHLORIDE CRYS ER 20 MEQ PO TBCR
20.0000 meq | EXTENDED_RELEASE_TABLET | Freq: Two times a day (BID) | ORAL | Status: DC
  Administered 2020-06-03 – 2020-06-10 (×14): 20 meq via ORAL
  Filled 2020-06-03 (×14): qty 1

## 2020-06-03 NOTE — Progress Notes (Signed)
Speech Language Pathology Daily Session Note  Patient Details  Name: Sherry Fitzpatrick MRN: [id number] Date of Birth: Dec 24, 1950  Today's Date: 06/03/2020 SLP Individual Time: 0340-3524 SLP Individual Time Calculation (min): 55 min  Short Term Goals: Week 1: SLP Short Term Goal 1 (Week 1): Patient will return demonstrate to perform EMST exercises to improve vocal intensity with min A. SLP Short Term Goal 1 - Progress (Week 1): Discontinued (comment) SLP Short Term Goal 2 (Week 1): Patient will demonstrate recall of complex medical information/interventions during semi-structured conversations with clinician with min A SLP Short Term Goal 3 (Week 1): Patient will perform complex functional problem solving tasks (medication and money managment, etc) with min A . SLP Short Term Goal 4 (Week 1): Patient will utilize strategies to improve vocal and speech function with supervision A. SLP Short Term Goal 4 - Progress (Week 1): Met  Skilled Therapeutic Interventions: Skilled treatment session focused on cognitive goals. Upon arrival, patient was on commode. SLP offered to assist but patient initially declined reporting, "it's not your job." SLP provided education regarding role on inpatient rehab and patient finally agreeable. Patient ambulated back to her bed and had appeared SOB but quickly recovered. Patient independently recalled 7/9 medications and their functions but required Min verbal cues for recall of new medications. Suggest organizing a BID pill box during next session. Patient's vocal quality and intensity appeared to be back to baseline, patient agreed. Therefore, voice goals were discharged.  Patient left upright in bed with alarm on and all needs within reach. Continue with current plan of care.      Pain Pain Assessment Pain Scale: 0-10 Pain Score: 6  Pain Location: Abdomen Pain Orientation: Right  Therapy/Group: Individual Therapy  Garrell Flagg 06/03/2020, 3:15 PM

## 2020-06-03 NOTE — Progress Notes (Signed)
[place] PHYSICAL MEDICINE & REHABILITATION PROGRESS NOTE  Subjective/Complaints: Patient seen sitting up in a chair working with therapy this morning.  She states she slept well overnight.  She states she had a good first day of therapy yesterday.  She has questions regarding outside food with additional sweeteners-GI diet.  ROS: Denies CP, SOB, N/V/D  Objective: Vital Signs: Blood pressure (!) 178/87, pulse 81, temperature 99 F (37.2 C), resp. rate 20, height 5\' 4"  (1.626 m), weight 116.8 kg, SpO2 94 %. No results found. Recent Labs    06/02/20 1056  WBC 14.6*  HGB 8.8*  HCT 27.7*  PLT 293   Recent Labs    06/01/20 1412 06/02/20 1056  NA 142 137  K 3.6 3.3*  CL 106 102  CO2 27 26  GLUCOSE 117* 144*  BUN 14 10  CREATININE 1.05* 0.99  CALCIUM 8.3* 8.2*    Physical Exam: BP (!) 178/87 (BP Location: Left Arm)   Pulse 81   Temp 99 F (37.2 C)   Resp 20   Ht 5\' 4"  (1.626 m)   Wt 116.8 kg   SpO2 94%   BMI 44.20 kg/m  Constitutional: No distress . Vital signs reviewed.  Morbidly obese. HENT: Normocephalic.  Atraumatic. Eyes: EOMI. No discharge. Cardiovascular: No JVD.  RRR. Respiratory: Normal effort.  No stridor.  Bilateral clear to auscultation. GI: Non-distended.  BS +. Skin: Warm and dry.  Intact. Psych: Normal mood.  Normal behavior. Musc: No edema in extremities.  No tenderness in extremities. Neuro: Alert and oriented Dysarthria, improving Motor: 4+/5 throughout   Assessment/Plan: 1. Functional deficits secondary to bilateral cerebral/cerebellar infarcts after cardiac cath which require 3+ hours per day of interdisciplinary therapy in a comprehensive inpatient rehab setting.  Physiatrist is providing close team supervision and 24 hour management of active medical problems listed below.  Physiatrist and rehab team continue to assess barriers to discharge/monitor patient progress toward functional and medical goals  Care Tool:  Bathing    Body  parts bathed by patient: Right arm, Left arm, Chest, Abdomen, Face   Body parts bathed by helper: Front perineal area, Buttocks     Bathing assist Assist Level: Moderate Assistance - Patient 50 - 74%     Upper Body Dressing/Undressing Upper body dressing   What is the patient wearing?: Pull over shirt    Upper body assist Assist Level: Minimal Assistance - Patient > 75%    Lower Body Dressing/Undressing Lower body dressing      What is the patient wearing?: Incontinence brief     Lower body assist Assist for lower body dressing: Maximal Assistance - Patient 25 - 49%     Toileting Toileting    Toileting assist Assist for toileting: Total Assistance - Patient < 25%     Transfers Chair/bed transfer  Transfers assist     Chair/bed transfer assist level: Moderate Assistance - Patient 50 - 74%     Locomotion Ambulation   Ambulation assist      Assist level: Minimal Assistance - Patient > 75% Assistive device: No Device Max distance: 49ft   Walk 10 feet activity   Assist     Assist level: Minimal Assistance - Patient > 75% Assistive device: No Device   Walk 50 feet activity   Assist Walk 50 feet with 2 turns activity did not occur: Safety/medical concerns (fatigued after 52ft)         Walk 150 feet activity   Assist Walk 150 feet activity did  not occur: Safety/medical concerns         Walk 10 feet on uneven surface  activity   Assist Walk 10 feet on uneven surfaces activity did not occur: Safety/medical concerns         Wheelchair     Assist Will patient use wheelchair at discharge?: No             Wheelchair 50 feet with 2 turns activity    Assist            Wheelchair 150 feet activity     Assist            Medical Problem List and Plan: 1.  Altered mental status with dysarthria secondary to bilateral cerebral/cerebellar after cardiac catheterization/PAF  Continue CIR 2.   Antithrombotics: -DVT/anticoagulation: SCDs             -antiplatelet therapy: Aspirin 81 mg daily and Plavix 75 mg daily 3. Pain Management: Tylenol as needed 4. Mood: Provide emotional support             -antipsychotic agents: N/A 5. Neuropsych: This patient is capable of making decisions on her own behalf. 6. Skin/Wound Care: Routine skin checks 7. Fluids/Electrolytes/Nutrition: Routine in and outs. 8. Labile blood pressure:   Previously with hypotension, now with hypertension on 8/20  Norvasc increased to 5 on 8/20  Monitor with increased mobility 9.  Diabetes mellitus with hyperglycemia.  Hemoglobin A1c 7.3.  NovoLog 4 units every 4 hours.  Check blood sugars as directed.  Mildly elevated on 8/20, will consider further adjustments to meds if necessary  Monitoring with increased mobility 10.  Systolic congestive heart failure. Imdur 30 mg daily.  Monitor for any signs of fluid overload  Daily weights ordered [facility] Weights   06/01/20 1647  Weight: 116.8 kg  11.  Morbid obesity.  BMI 44.24.  Dietary follow-up.  Encourage weight loss 12.  Hyperlipidemia.  Lipitor 13.  CAP.  IV Rocephin completed.  Check oxygen saturations every shift 14.  Post stroke dysphagia:   Now on regular thins  Advance diet as tolerated 15.  Leukocytosis  WBCs 14.6 on 8/19  Afebrile 16. Atrial fibrillation  Continue amiodarone 100 mg daily.  17.  Acute blood loss anemia  Hemoglobin 8.8 on 8/19  Continue to monitor 18.  Hypokalemia  Potassium 3.3 on 8/19  Supplemented increased on 8/20  Labs ordered for Monday  19.  Hypoalbuminemia  Supplement initiated on 8/20  LOS: 2 days A FACE TO FACE EVALUATION WAS PERFORMED  Jullisa Grigoryan Lorie Phenix 06/03/2020, 9:46 AM

## 2020-06-03 NOTE — Progress Notes (Signed)
Occupational Therapy Session Note  Patient Details  Name: Sherry Fitzpatrick MRN: [id number] Date of Birth: Oct 24, 1950  Today's Date: 06/03/2020 OT Individual Time: 5945-8592 OT Individual Time Calculation (min): 60 min    Short Term Goals: Week 1:  OT Short Term Goal 1 (Week 1): Pt will dress LB using AE as needed with min assist OT Short Term Goal 2 (Week 1): Pt will complete toilet transfer with CGA OT Short Term Goal 3 (Week 1): Pt will complete LB bathing using AE as needed with min assist  Skilled Therapeutic Interventions/Progress Updates:    Pt received in bed dressed and ready for the day. She stated she was washed up early this am by nursing and had fresh clothes on.   Pt agreeable to working on general mobility and endurance to improve her ability to ambulate in her room to access bathroom as needed and to reach a Supervision or higher level at home.   Pt needed extra time to move at her own pace due to R abdominal pain, but she was able to sit to EOB, stand up and stay in standing for 15 minutes (used RW for support when doing heel raises),  Step transfer to arm chair, stand from arm chair and then pivot back to bed all with S.    Pt was wearing leggings and a long dress, she doffed leggings as she only has SLP therapy this afternoon. She pulled pants down and needed min A to completely doff off of feet.    Pt moved back to supine and adjusted herself in bed with S.  Bed alarm set and all needs met.  Therapy Documentation Precautions:  Precautions Precautions: Fall Precaution Comments: monitor BP; ensure medicated prior to treatment Restrictions Weight Bearing Restrictions: Yes Other Position/Activity Restrictions: R lower abdominal hematoma causing significant pain with mobility  Pain: Pain Assessment Pain Scale: 0-10 Pain Score: 6  Pain Location: Abdomen Pain Orientation: Right    Therapy/Group: Individual Therapy  [place] 06/03/2020, 12:37 PM

## 2020-06-03 NOTE — Progress Notes (Signed)
Physical Therapy Session Note  Patient Details  Name: Sherry Fitzpatrick MRN: [id number] Date of Birth: [birth year]  Today's Date: 06/03/2020 PT Individual Time: 0800-0915 PT Individual Time Calculation (min): 75 min   Short Term Goals: Week 1:  PT Short Term Goal 1 (Week 1): Pt will perform bed mobility with minA PT Short Term Goal 2 (Week 1): Pt will perform sit<>stand transfers with LRAD and minA PT Short Term Goal 3 (Week 1): Pt will ambulate 12ft with LRAD and minA PT Short Term Goal 4 (Week 1): Pt will go up/down x4 steps with modA  Skilled Therapeutic Interventions/Progress Updates:    Pt received supine in bed, agreeable to PT session. Pt reports 7/10 R lower abdominal pain from her hematoma, she received Tylenol ~2hr prior to arrival and not due for pain med, but RN aware. Pt performed supine<>sit with minA, increased difficulty with trunk to upright 2/2 abdominal pain. Sit<>stand from EOB to RW with CGA, cues for BUE placement. Pt ambulated to her sink with CGA and RW where she brushed her teeth with CGA while unsupported, able to manage toothpaste/brush appropriately. Pt then performed stand>sit to w/c with minA for controlled lowering, transported to hallway for time management. Pt ambulated 44ft with CGA and RW on level surfaces, step-to gait with light reliance of BUE's through RW, increased work of breathing but recovers quickly and appropriately with seated rest. Cues for upright posture, normalizing gait, and increasing step length. Pt then taken outside near [facility] in w/c with totalA for time management. While outside, she ambulated 2x80ft with CGA and no AD on unlevel pavers, demo's wide BOS with a "waddling" gait pattern, increased B hip hike with limited B hip flexion, likely due to abdominal discomfort. Pt then returned to rehab floor in w/c with totalA and navigated up x8 3inch steps with CGA and BHR support, descended x4 6inch steps with CGA and B HR support, cues for safety,  sequencing, and technique (ascend with L, descend with R). After seated rest, pt performed 2x10 front shldr raises while standing with 4lb straight bar weight. Pt reports moderate fatigue after completion. Pt returned to room in w/c with totalA, stand-pivot transfer with CGA and no AD to EOB, sit>supine with minA for RLE management. Pt ended session supine in bed, bed rails up, bed alarm on, needs in reach. Care plan updated in room to reflect pt's improved progress.   Therapy Documentation Precautions:  Precautions Precautions: Fall Precaution Comments: monitor BP; ensure medicated prior to treatment Restrictions Weight Bearing Restrictions: Yes Other Position/Activity Restrictions: R lower abdominal hematoma causing significant pain with mobility Vital Signs: Oxygen Therapy SpO2: 94 % O2 Device: Room Air  Therapy/Group: Individual Therapy   Sherry Fitzpatrick [name] PT 06/03/2020, 10:40 AM

## 2020-06-04 ENCOUNTER — Inpatient Hospital Stay (HOSPITAL_COMMUNITY): Payer: Medicare HMO

## 2020-06-04 ENCOUNTER — Inpatient Hospital Stay (HOSPITAL_COMMUNITY): Payer: Medicare HMO | Admitting: Speech Pathology

## 2020-06-04 LAB — GLUCOSE, CAPILLARY
Glucose-Capillary: 126 mg/dL — ABNORMAL HIGH (ref 70–99)
Glucose-Capillary: 93 mg/dL (ref 70–99)
Glucose-Capillary: 110 mg/dL — ABNORMAL HIGH (ref 70–99)
Glucose-Capillary: 117 mg/dL — ABNORMAL HIGH (ref 70–99)

## 2020-06-04 NOTE — Progress Notes (Signed)
Physical Therapy Session Note  Patient Details  Name: Sherry Fitzpatrick MRN: [id number] Date of Birth: [birth year]  Today's Date: 06/04/2020 PT Individual Time: 0841-0950 PT Individual Time Calculation (min): 69 min   Short Term Goals: Week 1:  PT Short Term Goal 1 (Week 1): Pt will perform bed mobility with minA PT Short Term Goal 2 (Week 1): Pt will perform sit<>stand transfers with LRAD and minA PT Short Term Goal 3 (Week 1): Pt will ambulate 38ft with LRAD and minA PT Short Term Goal 4 (Week 1): Pt will go up/down x4 steps with modA  Skilled Therapeutic Interventions/Progress Updates:     Pt received seated in WC and agreeable to therapy. Reports pain in RLQ of abdomen, shooting in natures. Number not provided. PT provides rest breaks as needed to manage pain.  WC transport outside for time management. Pt ambulates overground for gait training on varying surfaces such as brick and cement with slight grades and in open environment. Pt ambulates bouts of 80', 13', 80', and 80' with extended seated rest breaks. Pt ambulates without AD and PT provides CGA at hips for safety. Verbal cues for upright gaze to improve posture and balance, increasing bilateral stride length and gait speed to decrease risk for falls, and pursed lip breathing to optimize oxygen sats and endurance. PT cues for increased eccentric control when going from stand to sitting due to pt tendency to "flop" backward. Pt improves gait mechanics and eccentric control following cues.   Pt performs SPT from WC>bed with CGA. Sit to supine with supervision and cues for positioning and sequencing. Pt left supine in bed with alarm intact and all needs within reach.  Therapy Documentation Precautions:  Precautions Precautions: Fall Precaution Comments: monitor BP; ensure medicated prior to treatment Restrictions Weight Bearing Restrictions: No Other Position/Activity Restrictions: R lower abdominal hematoma causing significant pain  with mobility   Therapy/Group: Individual Therapy  Breck Coons, PT, DPT 06/04/2020, 12:52 PM

## 2020-06-04 NOTE — Progress Notes (Signed)
Speech Language Pathology Daily Session Note  Patient Details  Name: Sherry Fitzpatrick MRN: [id number] Date of Birth: July 20, 1951  Today's Date: 06/04/2020 SLP Individual Time: 2751-7001 SLP Individual Time Calculation (min): 45 min  Short Term Goals: Week 1: SLP Short Term Goal 1 (Week 1): Patient will return demonstrate to perform EMST exercises to improve vocal intensity with min A. SLP Short Term Goal 1 - Progress (Week 1): Discontinued (comment) SLP Short Term Goal 2 (Week 1): Patient will demonstrate recall of complex medical information/interventions during semi-structured conversations with clinician with min A SLP Short Term Goal 3 (Week 1): Patient will perform complex functional problem solving tasks (medication and money managment, etc) with min A . SLP Short Term Goal 4 (Week 1): Patient will utilize strategies to improve vocal and speech function with supervision A. SLP Short Term Goal 4 - Progress (Week 1): Met  Skilled Therapeutic Interventions:   Patient seen for skilled ST therapy session with focus on cognitive function goals. SLP observed patient to throat clear frequently and did discuss probability of GERD causing this, which patient was not fully aware. Session focused on review of her medications, discussing what medications she was taking at home versus hospital. Patient was contradictory at times, telling SLP, "I only take aspirin" and then later saying she took 4-5 different medications at home. Overall, however, patient was able to focus on task and demonstrated understanding without significant difficulty. She got frustrated she could not think of a medication name and then requested to call her daughter to ask. SLP showed patient a weekly twice a day pill box and she liked it and said she would ask her daughter to get her one. Patient was able to recall and discuss recent medical interventions with minimal A. Patient continues to benefit from skilled ST treatment to focus  on cognitive skills prior to discharge.  Pain Pain Assessment Pain Scale: 0-10 Pain Score: 4  Pain Type: Acute pain Pain Location: Abdomen Pain Orientation: Right Pain Descriptors / Indicators: Jabbing;Sharp Pain Onset: Sudden Pain Intervention(s): Rest Multiple Pain Sites: No  Therapy/Group: Individual Therapy  Sonia Baller, MA, CCC-SLP 06/04/20 4:22 PM

## 2020-06-04 NOTE — Progress Notes (Addendum)
[place] PHYSICAL MEDICINE & REHABILITATION PROGRESS NOTE  Subjective/Complaints: C/o of pain in her RLQ. Says it's tender and feels "like a bumpy highway". Pain started yesterday.    ROS: Patient denies fever, rash, sore throat, blurred vision, nausea, vomiting, diarrhea, cough, shortness of breath or chest pain,   headache, or mood change.   Objective: Vital Signs: Blood pressure (!) 174/63, pulse 76, temperature 98.4 F (36.9 C), resp. rate 17, height 5\' 4"  (1.626 m), weight 117.3 kg, SpO2 97 %. No results found. Recent Labs    06/02/20 1056  WBC 14.6*  HGB 8.8*  HCT 27.7*  PLT 293   Recent Labs    06/01/20 1412 06/02/20 1056  NA 142 137  K 3.6 3.3*  CL 106 102  CO2 27 26  GLUCOSE 117* 144*  BUN 14 10  CREATININE 1.05* 0.99  CALCIUM 8.3* 8.2*    Physical Exam: BP (!) 174/63 (BP Location: Right Arm)   Pulse 76   Temp 98.4 F (36.9 C)   Resp 17   Ht 5\' 4"  (1.626 m)   Wt 117.3 kg   SpO2 97%   BMI 44.39 kg/m  Constitutional: No distress . Vital signs reviewed. HEENT: EOMI, oral membranes moist Neck: supple Cardiovascular: RRR without murmur. No JVD    Respiratory/Chest: CTA Bilaterally without wheezes or rales. Normal effort    GI/Abdomen: BS + but scarce. ?herniation of RLQ (4"x6+" area) protruding over waist band, tissue is firm, has chronic stretch marks as well.  Ext: no clubbing, cyanosis, or edema Psych: pleasant and cooperative Skin: Warm and dry.  Intact. Psych: Normal mood.  Normal behavior. Musc: No edema in extremities.  No tenderness in extremities. Neuro: Alert and oriented Dysarthria, improving Motor: 4+/5, fair standing balance  Assessment/Plan: 1. Functional deficits secondary to bilateral cerebral/cerebellar infarcts after cardiac cath which require 3+ hours per day of interdisciplinary therapy in a comprehensive inpatient rehab setting.  Physiatrist is providing close team supervision and 24 hour management of active medical problems  listed below.  Physiatrist and rehab team continue to assess barriers to discharge/monitor patient progress toward functional and medical goals  Care Tool:  Bathing    Body parts bathed by patient: Right arm, Left arm, Chest, Abdomen, Face   Body parts bathed by helper: Front perineal area, Buttocks     Bathing assist Assist Level: Moderate Assistance - Patient 50 - 74%     Upper Body Dressing/Undressing Upper body dressing   What is the patient wearing?: Pull over shirt    Upper body assist Assist Level: Minimal Assistance - Patient > 75%    Lower Body Dressing/Undressing Lower body dressing      What is the patient wearing?: Incontinence brief     Lower body assist Assist for lower body dressing: Moderate Assistance - Patient 50 - 74%     Toileting Toileting    Toileting assist Assist for toileting: Moderate Assistance - Patient 50 - 74%     Transfers Chair/bed transfer  Transfers assist     Chair/bed transfer assist level: Minimal Assistance - Patient > 75%     Locomotion Ambulation   Ambulation assist      Assist level: Contact Guard/Touching assist Assistive device: Walker-rolling Max distance: 36ft   Walk 10 feet activity   Assist     Assist level: Contact Guard/Touching assist Assistive device: Walker-rolling   Walk 50 feet activity   Assist Walk 50 feet with 2 turns activity did not occur: Safety/medical concerns (fatigued  after 63ft)  Assist level: Contact Guard/Touching assist Assistive device: Walker-rolling    Walk 150 feet activity   Assist Walk 150 feet activity did not occur: Safety/medical concerns         Walk 10 feet on uneven surface  activity   Assist Walk 10 feet on uneven surfaces activity did not occur: Safety/medical concerns   Assist level: Contact Guard/Touching assist Assistive device: Other (comment) (no AD)   Wheelchair     Assist Will patient use wheelchair at discharge?: No              Wheelchair 50 feet with 2 turns activity    Assist            Wheelchair 150 feet activity     Assist            Medical Problem List and Plan: 1.  Altered mental status with dysarthria secondary to bilateral cerebral/cerebellar after cardiac catheterization/PAF  Continue CIR 2.  Antithrombotics: -DVT/anticoagulation: SCDs             -antiplatelet therapy: Aspirin 81 mg daily and Plavix 75 mg daily 3. Pain Management: Tylenol as needed 4. Mood: Provide emotional support             -antipsychotic agents: N/A 5. Neuropsych: This patient is capable of making decisions on her own behalf. 6. Skin/Wound Care: Routine skin checks 7. Fluids/Electrolytes/Nutrition: Routine in and outs. 8. Labile blood pressure:   Previously with hypotension, now with hypertension on 8/20  Norvasc increased to 5 on 8/20  8/21 still some elevation. Observe today 9.  Diabetes mellitus with hyperglycemia.  Hemoglobin A1c 7.3.  NovoLog 4 units every 4 hours.  Check blood sugars as directed.  Mildly elevated on 8/20, will consider further adjustments to meds if necessary  8/21 improved control 10.  Systolic congestive heart failure. Imdur 30 mg daily.  Monitor for any signs of fluid overload  Daily weights ordered [facility] Weights   06/01/20 1647 06/04/20 0451  Weight: 116.8 kg 117.3 kg  11.  Morbid obesity.  BMI 44.24.  Dietary follow-up.  Encourage weight loss 12.  Hyperlipidemia.  Lipitor 13.  CAP.  IV Rocephin completed.  Check oxygen saturations every shift 14.  Post stroke dysphagia:   Now on regular thins  Advance diet as tolerated 15.  Leukocytosis  WBCs 14.6 on 8/19  Afebrile 16. Atrial fibrillation  Continue amiodarone 100 mg daily.  17.  Acute blood loss anemia  Hemoglobin 8.8 on 8/19  Continue to monitor 18.  Hypokalemia  Potassium 3.3 on 8/19  Supplemented increased on 8/20  Labs ordered for Monday  19.  Hypoalbuminemia  Supplement initiated on 8/20 20.  Abdominal pain, distention in RLQ.   -had a bm this morning, has been moving bowels regularly  -will check KUB to assess for retained stool.   -reviewed recent abd CT which demonstrated a large right groin hematoma. hgb slowly trending down   -kpad to RLQ   -recheck CBC tomorrow morning.  LOS: 3 days A FACE TO FACE EVALUATION WAS PERFORMED  Meredith Staggers 06/04/2020, 9:47 AM

## 2020-06-04 NOTE — Progress Notes (Signed)
Occupational Therapy Session Note  Patient Details  Name: Sherry Fitzpatrick MRN: [id number] Date of Birth: 08/25/1951  Today's Date: 06/04/2020 OT Individual Time: 7893-8101 OT Individual Time Calculation (min): 75 min    Short Term Goals: Week 1:  OT Short Term Goal 1 (Week 1): Pt will dress LB using AE as needed with min assist OT Short Term Goal 2 (Week 1): Pt will complete toilet transfer with CGA OT Short Term Goal 3 (Week 1): Pt will complete LB bathing using AE as needed with min assist  Skilled Therapeutic Interventions/Progress Updates:    OT session focused on ADL retraining, functional transfers, activity tolerance, and education. Pt received supine in bed requiring increased time to wake up. Pt reporting significant pain in R abdomen with MD and RN aware. With time, pt agree to wake up and eat breakfast. OT provided education on role of OT, DME, and importance of therapy. Also discussed EC techniques in preparation of discharge. Pt completed supine>sit with min A and increased time. Completed short distance ambulation in room using RW with CGA and min cues for safety. Pt completed dressing from w/c level requiring min A for LB dressing. Educated on use of reacher with OT to provide at next session. Engaged in grooming tasks in standing, sustaining balance for ~3 minutes. OT educated on breathing techniques throughout session as pt quickly became SOB. At end of session, pt left in w/c with RN present.   Therapy Documentation Precautions:  Precautions Precautions: Fall Precaution Comments: monitor BP; ensure medicated prior to treatment Restrictions Weight Bearing Restrictions: No Other Position/Activity Restrictions: R lower abdominal hematoma causing significant pain with mobility General:   Vital Signs: Oxygen Therapy SpO2: 97 % O2 Device: Room Air Pain:   ADL: ADL Eating: Independent Where Assessed-Eating: Bed level Grooming: Setup Where Assessed-Grooming: Sitting at  sink Upper Body Bathing: Minimal assistance Where Assessed-Upper Body Bathing: Sitting at sink Lower Body Bathing: Moderate assistance Where Assessed-Lower Body Bathing: Sitting at sink Upper Body Dressing: Minimal assistance Where Assessed-Upper Body Dressing: Bed level Lower Body Dressing: Maximal assistance Where Assessed-Lower Body Dressing: Bed level Toileting: Minimal assistance Where Assessed-Toileting: Bedside Commode Toilet Transfer: Minimal assistance Toilet Transfer Method: Stand pivot Toilet Transfer Equipment: Bedside commode Vision   Perception    Praxis   Exercises:   Other Treatments:     Therapy/Group: Individual Therapy  Duayne Cal 06/04/2020, 12:37 PM

## 2020-06-05 ENCOUNTER — Inpatient Hospital Stay (HOSPITAL_COMMUNITY): Payer: Medicare HMO

## 2020-06-05 DIAGNOSIS — S301XXD Contusion of abdominal wall, subsequent encounter: Secondary | ICD-10-CM

## 2020-06-05 LAB — CBC
HCT: 26.6 % — ABNORMAL LOW (ref 36.0–46.0)
Hemoglobin: 8.4 g/dL — ABNORMAL LOW (ref 12.0–15.0)
MCH: 29 pg (ref 26.0–34.0)
MCHC: 31.6 g/dL (ref 30.0–36.0)
MCV: 91.7 fL (ref 80.0–100.0)
Platelets: 368 10*3/uL (ref 150–400)
RBC: 2.9 MIL/uL — ABNORMAL LOW (ref 3.87–5.11)
RDW: 15.9 % — ABNORMAL HIGH (ref 11.5–15.5)
WBC: 14.8 10*3/uL — ABNORMAL HIGH (ref 4.0–10.5)
nRBC: 0.1 % (ref 0.0–0.2)

## 2020-06-05 LAB — GLUCOSE, CAPILLARY
Glucose-Capillary: 123 mg/dL — ABNORMAL HIGH (ref 70–99)
Glucose-Capillary: 116 mg/dL — ABNORMAL HIGH (ref 70–99)
Glucose-Capillary: 115 mg/dL — ABNORMAL HIGH (ref 70–99)
Glucose-Capillary: 130 mg/dL — ABNORMAL HIGH (ref 70–99)

## 2020-06-05 MED ORDER — GUAIFENESIN 100 MG/5ML PO SOLN: 5.0000 mL | ORAL | Status: DC | PRN

## 2020-06-05 MED ORDER — TRAMADOL HCL 50 MG PO TABS
50.0000 mg | ORAL_TABLET | Freq: Four times a day (QID) | ORAL | Status: DC | PRN
  Administered 2020-06-05 – 2020-06-09 (×5): 50 mg via ORAL
  Filled 2020-06-05 (×6): qty 1

## 2020-06-05 NOTE — Consult Note (Signed)
Referring Physician: Dr. Alger Simons  Sherry Fitzpatrick is an 69 y.o. female.                       Chief Complaint: Right Lower quadrant hematoma/pain re-evaluation  HPI: 69 years old black female with recent cardiac catheterization and mid LAD stent had hypotensive shock and ischemic Cerebrovascular accident, paroxysmal atrial fibrillation, Altered mental staus with dysarthria, hypertension, type 2 DM, morbid obesity, obstructive sleep apnea and gouty arthritis has large right lower quadrant hematoma which very tender to touch. Patient feels swelling is improving very little but measurements on CT of abdomen and Pelvis is suggestive of minimal change of expansion. Patient is on aspirin and Plavix and needs to continue dual antiplatelet therapy at least 3 to 6 months due to recent stent. Her hemoglobin is down to 8.4 gm from 8.8 gm 3 days back. Her renalfunction has normalized. Last EKG 4 days back was sinus rhythm with non-specific ST-T changes.  Past Medical History:  Diagnosis Date  . Alterations of sensations, late effect of cerebrovascular disease(438.6)   . Arthritis    "all over"  . Backache, unspecified   . Body mass index 40.0-44.9, adult ([place])   . Carpal tunnel syndrome   . Dizziness and giddiness   . Esophageal reflux   . Generalized pain   . Headache    "had alot til I had pituitary tumor removed"  . Heart murmur   . Other abnormal glucose   . Other and unspecified hyperlipidemia   . Other malaise and fatigue   . Other postprocedural status(V45.89)   . Other symptoms involving cardiovascular system   . Sleep apnea    "did test; they wanted me to get a CPAP but I never did get it" (04/14/2015) Does use inhaler at night (01 2021)  . Stroke ([place]) 06/2003; 02/2010   "minor; minor" ; denies residual on 04/14/2015  . Unspecified disorder of the pituitary gland and its hypothalamic control   . Unspecified essential hypertension   . Unspecified visual disturbance       Past  Surgical History:  Procedure Laterality Date  . CARPAL TUNNEL RELEASE Left ~ 2014  . CORONARY STENT INTERVENTION N/A 05/24/2020   Procedure: CORONARY STENT INTERVENTION;  Surgeon: Jettie Booze, MD;  Location: [facility];  Service: Cardiovascular;  Laterality: N/A;  . JOINT REPLACEMENT    . LEFT HEART CATH AND CORONARY ANGIOGRAPHY N/A 05/24/2020   Procedure: LEFT HEART CATH AND CORONARY ANGIOGRAPHY;  Surgeon: Charolette Forward, MD;  Location: [facility];  Service: Cardiovascular;  Laterality: N/A;  . TOTAL HIP ARTHROPLASTY Right 11/06/2010  . TRANSPHENOIDAL / TRANSNASAL HYPOPHYSECTOMY / RESECTION PITUITARY TUMOR  06/13/2012  . TUBAL LIGATION  1974    Family History  Problem Relation Age of Onset  . Heart failure Mother   . Hypertension Mother   . Diabetes Father   . Hypertension Sister   . Heart failure Sister   . Hypertension Daughter    Social History:  reports that she has never smoked. She has never used smokeless tobacco. She reports current alcohol use. She reports that she does not use drugs.  Allergies:  Allergies  Allergen Reactions  . Morphine And Related Hives and Nausea And Vomiting  . Penicillins Hives    Has patient had a PCN reaction causing immediate rash, facial/tongue/throat swelling, SOB or lightheadedness with hypotension: Yes Has patient had a PCN reaction causing severe rash involving mucus membranes  or skin necrosis: No Has patient had a PCN reaction that required hospitalization: No Has patient had a PCN reaction occurring within the last 10 years: No If all of the above answers are "NO", then may proceed with Cephalosporin use.  Marland Kitchen Percocet [Oxycodone-Acetaminophen] Nausea And Vomiting  . Ace Inhibitors Nausea And Vomiting and Cough  . Iodinated Diagnostic Agents Nausea And Vomiting       . Iodine Itching  . Etodolac Nausea And Vomiting  . Tramadol Nausea And Vomiting  . Vicodin [Hydrocodone-Acetaminophen] Nausea And Vomiting     Medications Prior to Admission  Medication Sig Dispense Refill  . acetaminophen (TYLENOL) 500 MG tablet Take 1,000 mg by mouth every 6 (six) hours as needed for headache (pain).    Marland Kitchen albuterol (PROVENTIL HFA;VENTOLIN HFA) 108 (90 Base) MCG/ACT inhaler Inhale 2 puffs into the lungs every 6 (six) hours as needed for wheezing. (Patient taking differently: Inhale 2 puffs into the lungs every 6 (six) hours as needed for wheezing or shortness of breath. ) 1 Inhaler 3  . alum & mag hydroxide-simeth (MAALOX/MYLANTA) 200-200-20 MG/5ML suspension Take 30 mLs by mouth every 4 (four) hours as needed for indigestion or heartburn. 355 mL 0  . amiodarone (PACERONE) 200 MG tablet Place 0.5 tablets (100 mg total) into feeding tube daily. 30 tablet 3  . amLODipine (NORVASC) 5 MG tablet Take 1 tablet (5 mg total) by mouth daily. 30 tablet 3  . aspirin 81 MG chewable tablet Chew 81 mg by mouth daily.    Marland Kitchen atorvastatin (LIPITOR) 80 MG tablet Take 1 tablet (80 mg total) by mouth daily. (Patient taking differently: Take 80 mg by mouth every Thursday. ) 30 tablet 3  . budesonide-formoterol (SYMBICORT) 160-4.5 MCG/ACT inhaler Inhale 2 puffs into the lungs 2 (two) times daily. 1 Inhaler 1  . carvedilol (COREG) 6.25 MG tablet Take 6.25 mg by mouth 2 (two) times daily.    . clopidogrel (PLAVIX) 75 MG tablet Take 1 tablet (75 mg total) by mouth daily. 30 tablet 3  . ferrous sulfate 325 (65 FE) MG tablet Take 1 tablet (325 mg total) by mouth 3 (three) times daily with meals. 90 tablet 3  . furosemide (LASIX) 40 MG tablet Take 1 tablet (40 mg total) by mouth daily at 2 PM. (Patient taking differently: Take 40 mg by mouth daily. )    . ipratropium-albuterol (DUONEB) 0.5-2.5 (3) MG/3ML SOLN Take 3 mLs by nebulization every 4 (four) hours as needed. (Patient taking differently: Take 3 mLs by nebulization every 4 (four) hours as needed (for breathing). ) 360 mL 0  . metFORMIN (GLUCOPHAGE) 500 MG tablet Take 1 tablet (500 mg  total) by mouth daily with breakfast. 30 tablet 1  . nitroGLYCERIN (NITROSTAT) 0.4 MG SL tablet Place 1 tablet (0.4 mg total) under the tongue every 5 (five) minutes x 3 doses as needed for chest pain. 25 tablet 12  . pantoprazole (PROTONIX) 40 MG tablet Take 1 tablet (40 mg total) by mouth at bedtime. 30 tablet 3  . polyethylene glycol (MIRALAX / GLYCOLAX) 17 g packet Place 17 g into feeding tube daily. 14 each 0  . potassium chloride (KLOR-CON) 10 MEQ tablet Take 2 tablets (20 mEq total) by mouth daily. (Patient taking differently: Take 20 mEq by mouth daily at 2 PM. ) 30 tablet 1  . valsartan (DIOVAN) 160 MG tablet Take 1 tablet (160 mg total) by mouth daily. 30 tablet 3    Results for orders placed or performed during  the hospital encounter of 06/01/20 (from the past 48 hour(s))  Glucose, capillary     Status: Abnormal   Collection Time: 06/04/20  7:30 AM  Result Value Ref Range   Glucose-Capillary 126 (H) 70 - 99 mg/dL    Comment: Glucose reference range applies only to samples taken after fasting for at least 8 hours.  Glucose, capillary     Status: None   Collection Time: 06/04/20 11:45 AM  Result Value Ref Range   Glucose-Capillary 93 70 - 99 mg/dL    Comment: Glucose reference range applies only to samples taken after fasting for at least 8 hours.  Glucose, capillary     Status: Abnormal   Collection Time: 06/04/20  4:57 PM  Result Value Ref Range   Glucose-Capillary 110 (H) 70 - 99 mg/dL    Comment: Glucose reference range applies only to samples taken after fasting for at least 8 hours.  Glucose, capillary     Status: Abnormal   Collection Time: 06/04/20  9:12 PM  Result Value Ref Range   Glucose-Capillary 117 (H) 70 - 99 mg/dL    Comment: Glucose reference range applies only to samples taken after fasting for at least 8 hours.  CBC     Status: Abnormal   Collection Time: 06/05/20  4:57 AM  Result Value Ref Range   WBC 14.8 (H) 4.0 - 10.5 K/uL   RBC 2.90 (L) 3.87 - 5.11  MIL/uL   Hemoglobin 8.4 (L) 12.0 - 15.0 g/dL   HCT 26.6 (L) 36 - 46 %   MCV 91.7 80.0 - 100.0 fL   MCH 29.0 26.0 - 34.0 pg   MCHC 31.6 30.0 - 36.0 g/dL   RDW 15.9 (H) 11.5 - 15.5 %   Platelets 368 150 - 400 K/uL   nRBC 0.1 0.0 - 0.2 %    Comment: Performed at [facility] [address], [place], [place] [address]  Glucose, capillary     Status: Abnormal   Collection Time: 06/05/20  5:31 AM  Result Value Ref Range   Glucose-Capillary 123 (H) 70 - 99 mg/dL    Comment: Glucose reference range applies only to samples taken after fasting for at least 8 hours.  Glucose, capillary     Status: Abnormal   Collection Time: 06/05/20 11:37 AM  Result Value Ref Range   Glucose-Capillary 116 (H) 70 - 99 mg/dL    Comment: Glucose reference range applies only to samples taken after fasting for at least 8 hours.  Glucose, capillary     Status: Abnormal   Collection Time: 06/05/20  4:53 PM  Result Value Ref Range   Glucose-Capillary 115 (H) 70 - 99 mg/dL    Comment: Glucose reference range applies only to samples taken after fasting for at least 8 hours.  Glucose, capillary     Status: Abnormal   Collection Time: 06/05/20  9:10 PM  Result Value Ref Range   Glucose-Capillary 130 (H) 70 - 99 mg/dL    Comment: Glucose reference range applies only to samples taken after fasting for at least 8 hours.   CT ABDOMEN PELVIS WO CONTRAST  Result Date: 06/05/2020 CLINICAL DATA:  Pain.  Follow-up hematoma. EXAM: CT ABDOMEN AND PELVIS WITHOUT CONTRAST TECHNIQUE: Multidetector CT imaging of the abdomen and pelvis was performed following the standard protocol without IV contrast. COMPARISON:  May 27, 2020 FINDINGS: Lower chest: Consolidation in the lower lobe seen previously has largely resolved, consistent with resolving atelectasis. No acute abnormalities are  seen in the lower chest. Hepatobiliary: No focal liver abnormality is seen. No gallstones, gallbladder wall thickening, or biliary dilatation.  Pancreas: Unremarkable. No pancreatic ductal dilatation or surrounding inflammatory changes. Spleen: Normal in size without focal abnormality. Adrenals/Urinary Tract: A left renal cyst is again identified. The adrenal glands, kidneys, ureters, and bladder are otherwise normal. Stomach/Bowel: The stomach and small bowel are normal. Diverticulosis without diverticulitis. The appendix is unremarkable. Vascular/Lymphatic: Atherosclerosis in the nonaneurysmal aorta. No adenopathy. Reproductive: Uterus and bilateral adnexa are unremarkable. Other: No free air free fluid. Right groin hematoma extending into the right lower anterior pelvic wall persists. The hematoma in the right anterior groin measures 12 by 8 cm today versus 12 x 5.7 cm previously. The component of hematoma in the anterior right pelvic wall is similar in attenuation to surrounding muscle and difficult to measure with certainty. This portion of hematoma measures approximately 7.6 x 3.0 cm today versus 7.8 x 3.7 cm previously. The anterior aspect of the hematoma as seen on coronal image 28 appears larger in the interval measuring 13.6 x 7.4 cm today versus 13.2 by 5.5 cm previously. The more posterior aspect associated with the anterior musculature is more difficult to measure as it is now similar in attenuation to the adjacent musculature. However, this portion of hematoma appears smaller in the interval. The cranial caudal dimension measures 17.5 cm today versus 17.8 cm previously. Musculoskeletal: Right hip replacement. No other acute bony abnormalities. IMPRESSION: 1. The anterior aspect of the hematoma in the anterior pelvis is larger in the interval. The hematoma with in the anterior musculature is a little smaller in the interval. 2. Interval resolution of bibasilar consolidation. 3. Diverticulosis without diverticulitis. 4. Atherosclerosis in the nonaneurysmal aorta. Electronically Signed   By: Dorise Bullion III M.D   On: 06/05/2020 11:28   DG  Abd 1 View  Result Date: 06/04/2020 CLINICAL DATA:  Right-sided abdominal pain EXAM: ABDOMEN - 1 VIEW COMPARISON:  None. FINDINGS: The bowel gas pattern is normal. No radio-opaque calculi or other significant radiographic abnormality are seen. IMPRESSION: Negative. Electronically Signed   By: Dorise Bullion III M.D   On: 06/04/2020 15:18    Review Of Systems Constitutional: No fever, chills, weight loss or gain. Eyes: No vision change, wears glasses. No discharge or pain. Ears: No hearing loss, No tinnitus. Respiratory: No asthma, COPD, pneumonias. No shortness of breath. No hemoptysis. Cardiovascular: No chest pain, palpitation, leg edema. Gastrointestinal: No nausea, vomiting, diarrhea, constipation. No GI bleed. No hepatitis. Genitourinary: No dysuria, hematuria, kidney stone. No incontinance. Neurological: No headache, stroke, seizures.  Psychiatry: No psych facility admission for anxiety, depression, suicide. No detox. Skin: No rash. Musculoskeletal: No joint pain, fibromyalgia. No neck pain, back pain. Lymphadenopathy: No lymphadenopathy. Hematology: No anemia or easy bruising.   Blood pressure (!) 159/75, pulse 71, temperature (!) 97.2 F (36.2 C), resp. rate 18, height 5\' 4"  (1.626 m), weight 115.2 kg, SpO2 98 %. Body mass index is 43.59 kg/m. General appearance: alert, cooperative, appears stated age and no distress Head: Normocephalic, atraumatic. Eyes: Brown eyes, pale conjunctiva, corneas clear. PERRL, EOM's intact. Neck: No adenopathy, no carotid bruit, no JVD, supple, symmetrical, trachea midline and thyroid not enlarged. Resp: Clear to auscultation bilaterally. Cardio: Regular rate and rhythm, S1, S2 normal, II/VI systolic murmur, no click, rub or gallop GI: Soft, non-tender; bowel sounds normal; no organomegaly. Extremities: No edema, cyanosis or clubbing. Large right groin/Pelvic area hematoma. Skin: Warm and dry.  Neurologic: Alert and oriented X 2,  normal  strength. Speech is thick.  Assessment/Plan Right anterior pelvis hematoma Right anterior pelvic musculature hematoma Multivessel cad Recent LAD stent Anemia of blood loss Morbid obesity Type 2 DM Recent ischemic stroke Altered mental status Paroxysmal atrial fibrillation OSA  Continue medical treatment. No change in dual antiplatelet therapy.  Time spent: Review of old records, Lab, x-rays, EKG, other cardiac tests, examination, discussion with patient over 70 minutes.  Birdie Riddle, MD  06/05/2020, 9:53 PM

## 2020-06-05 NOTE — Progress Notes (Addendum)
[facility] PHYSICAL MEDICINE & REHABILITATION PROGRESS NOTE  Subjective/Complaints: Still c/o pain in RLQ. Hurts to cough or with basic movement.   ROS: Patient denies fever, rash, sore throat, blurred vision, nausea, vomiting, diarrhea,  shortness of breath or chest pain,   headache, or mood change.    Objective: Vital Signs: Blood pressure (!) 178/76, pulse 76, temperature 98 F (36.7 C), resp. rate 18, height 5\' 4"  (1.626 m), weight 115.2 kg, SpO2 95 %. DG Abd 1 View  Result Date: 06/04/2020 CLINICAL DATA:  Right-sided abdominal pain EXAM: ABDOMEN - 1 VIEW COMPARISON:  None. FINDINGS: The bowel gas pattern is normal. No radio-opaque calculi or other significant radiographic abnormality are seen. IMPRESSION: Negative. Electronically Signed   By: Dorise Bullion III M.D   On: 06/04/2020 15:18   Recent Labs    06/02/20 1056 06/05/20 0457  WBC 14.6* 14.8*  HGB 8.8* 8.4*  HCT 27.7* 26.6*  PLT 293 368   Recent Labs    06/02/20 1056  NA 137  K 3.3*  CL 102  CO2 26  GLUCOSE 144*  BUN 10  CREATININE 0.99  CALCIUM 8.2*    Physical Exam: BP (!) 178/76 (BP Location: Right Arm)   Pulse 76   Temp 98 F (36.7 C)   Resp 18   Ht 5\' 4"  (1.626 m)   Wt 115.2 kg   SpO2 95%   BMI 43.59 kg/m  Constitutional: No distress . Vital signs reviewed. obese HEENT: EOMI, oral membranes moist Neck: supple Cardiovascular: RRR without murmur. No JVD    Respiratory/Chest: CTA Bilaterally without wheezes or rales. Normal effort . Occasional cough  GI/Abdomen: BS +. Continued mass RLQ, groin. More easily appreciated while she's lying in bed. Tender to palpation, firm.  Ext: no clubbing, cyanosis, or edema Psych: pleasant and cooperative Skin: Warm and dry.  Intact. Psych: Normal mood.  Normal behavior. Musc: No edema in extremities.  No tenderness in extremities. Neuro: Alert and oriented Dysarthria, improving Motor: 4+/5 with pain inhibition in LE's   Assessment/Plan: 1. Functional  deficits secondary to bilateral cerebral/cerebellar infarcts after cardiac cath which require 3+ hours per day of interdisciplinary therapy in a comprehensive inpatient rehab setting.  Physiatrist is providing close team supervision and 24 hour management of active medical problems listed below.  Physiatrist and rehab team continue to assess barriers to discharge/monitor patient progress toward functional and medical goals  Care Tool:  Bathing    Body parts bathed by patient: Right arm, Left arm, Chest, Abdomen, Face   Body parts bathed by helper: Front perineal area, Buttocks     Bathing assist Assist Level: Moderate Assistance - Patient 50 - 74%     Upper Body Dressing/Undressing Upper body dressing   What is the patient wearing?: Pull over shirt    Upper body assist Assist Level: Set up assist    Lower Body Dressing/Undressing Lower body dressing      What is the patient wearing?: Pants     Lower body assist Assist for lower body dressing: Minimal Assistance - Patient > 75%     Toileting Toileting    Toileting assist Assist for toileting: Moderate Assistance - Patient 50 - 74%     Transfers Chair/bed transfer  Transfers assist     Chair/bed transfer assist level: Contact Guard/Touching assist     Locomotion Ambulation   Ambulation assist      Assist level: Contact Guard/Touching assist Assistive device: Walker-rolling Max distance: 130'   Walk 10 feet  activity   Assist     Assist level: Contact Guard/Touching assist Assistive device: Walker-rolling   Walk 50 feet activity   Assist Walk 50 feet with 2 turns activity did not occur: Safety/medical concerns (fatigued after 23ft)  Assist level: Contact Guard/Touching assist Assistive device: Walker-rolling    Walk 150 feet activity   Assist Walk 150 feet activity did not occur: Safety/medical concerns         Walk 10 feet on uneven surface  activity   Assist Walk 10 feet on  uneven surfaces activity did not occur: Safety/medical concerns   Assist level: Contact Guard/Touching assist Assistive device: Other (comment) (no AD)   Wheelchair     Assist Will patient use wheelchair at discharge?: No             Wheelchair 50 feet with 2 turns activity    Assist            Wheelchair 150 feet activity     Assist           BP (!) 178/76 (BP Location: Right Arm)   Pulse 76   Temp 98 F (36.7 C)   Resp 18   Ht 5\' 4"  (1.626 m)   Wt 115.2 kg   SpO2 95%   BMI 43.59 kg/m   Medical Problem List and Plan: 1.  Altered mental status with dysarthria secondary to bilateral cerebral/cerebellar after cardiac catheterization/PAF  Continue CIR 2.  Antithrombotics: -DVT/anticoagulation: SCDs             -antiplatelet therapy: Aspirin 81 mg daily and Plavix 75 mg daily 3. Pain Management: Tylenol as needed 4. Mood: Provide emotional support             -antipsychotic agents: N/A 5. Neuropsych: This patient is capable of making decisions on her own behalf. 6. Skin/Wound Care: Routine skin checks 7. Fluids/Electrolytes/Nutrition: Routine in and outs. 8. Labile blood pressure:   Previously with hypotension, now with hypertension on 8/20  Norvasc increased to 5 on 8/20 without much change  8/21-2 still some elevation, particular SBP. Having pain however per below.    -observe for now, treat pain 9.  Diabetes mellitus with hyperglycemia.  Hemoglobin A1c 7.3.  NovoLog 4 units every 4 hours.  Check blood sugars as directed.  Mildly elevated on 8/20, will consider further adjustments to meds if necessary  8/21-22 improved control 10.  Systolic congestive heart failure. Imdur 30 mg daily.  Monitor for any signs of fluid overload  Daily weights ordered Filed Weights   06/01/20 1647 06/04/20 0451 06/05/20 0500  Weight: 116.8 kg 117.3 kg 115.2 kg  11.  Morbid obesity.  BMI 44.24.  Dietary follow-up.  Encourage weight loss 12.  Hyperlipidemia.   Lipitor 13.  CAP.  IV Rocephin completed.  Check oxygen saturations every shift 14.  Post stroke dysphagia:   Now on regular thins  Advance diet as tolerated 15.  Leukocytosis  WBCs 14.6 on 8/19  Afebrile 16. Atrial fibrillation  Continue amiodarone 100 mg daily.  17.  Acute blood loss anemia  Hemoglobin 8.8 on 8/19  Continue to monitor 18.  Hypokalemia  Potassium 3.3 on 8/19  Supplemented increased on 8/20  Labs ordered for Monday  19.  Hypoalbuminemia  Supplement initiated on 8/20 20. Right groin hematoma first noted after cardiac cath.   8/22-reviewed recent abd CT which demonstrated a large right groin hematoma. hgb slowly trending down-->8.4 today   -repeat CT abdomen this morning, pelvis demonstrates ongoing  hematoma which is larger when compared to study 8/13   -tramadol for pain, kpad   -spoke to Dr. Doylene Canard who's covering for Dr. Terrence Dupont. he will come see patient today. May be risky to hold ASA/Plavix given recent stent.    LOS: 4 days A FACE TO FACE EVALUATION WAS PERFORMED  Meredith Staggers 06/05/2020, 9:28 AM

## 2020-06-06 ENCOUNTER — Inpatient Hospital Stay (HOSPITAL_COMMUNITY): Payer: Medicare HMO | Admitting: Occupational Therapy

## 2020-06-06 ENCOUNTER — Inpatient Hospital Stay (HOSPITAL_COMMUNITY): Payer: Medicare HMO

## 2020-06-06 LAB — BASIC METABOLIC PANEL
Anion gap: 8 (ref 5–15)
BUN: 14 mg/dL (ref 8–23)
CO2: 27 mmol/L (ref 22–32)
Calcium: 8.4 mg/dL — ABNORMAL LOW (ref 8.9–10.3)
Chloride: 103 mmol/L (ref 98–111)
Creatinine, Ser: 0.91 mg/dL (ref 0.44–1.00)
GFR calc Af Amer: >60 mL/min (ref >60)
GFR calc non Af Amer: >60 mL/min (ref >60)
Glucose, Bld: 110 mg/dL — ABNORMAL HIGH (ref 70–99)
Potassium: 4.2 mmol/L (ref 3.5–5.1)
Sodium: 138 mmol/L (ref 135–145)

## 2020-06-06 LAB — CBC
HCT: 27.7 % — ABNORMAL LOW (ref 36.0–46.0)
Hemoglobin: 8.6 g/dL — ABNORMAL LOW (ref 12.0–15.0)
MCH: 28.8 pg (ref 26.0–34.0)
MCHC: 31 g/dL (ref 30.0–36.0)
MCV: 92.6 fL (ref 80.0–100.0)
Platelets: 406 10*3/uL — ABNORMAL HIGH (ref 150–400)
RBC: 2.99 MIL/uL — ABNORMAL LOW (ref 3.87–5.11)
RDW: 15.9 % — ABNORMAL HIGH (ref 11.5–15.5)
WBC: 14.4 10*3/uL — ABNORMAL HIGH (ref 4.0–10.5)
nRBC: 0.1 % (ref 0.0–0.2)

## 2020-06-06 LAB — GLUCOSE, CAPILLARY
Glucose-Capillary: 119 mg/dL — ABNORMAL HIGH (ref 70–99)
Glucose-Capillary: 114 mg/dL — ABNORMAL HIGH (ref 70–99)
Glucose-Capillary: 142 mg/dL — ABNORMAL HIGH (ref 70–99)
Glucose-Capillary: 149 mg/dL — ABNORMAL HIGH (ref 70–99)

## 2020-06-06 MED ORDER — DOCUSATE SODIUM 100 MG PO CAPS
100.0000 mg | ORAL_CAPSULE | Freq: Two times a day (BID) | ORAL | Status: DC
  Administered 2020-06-06 – 2020-06-10 (×7): 100 mg via ORAL
  Filled 2020-06-06 (×8): qty 1

## 2020-06-06 MED ORDER — AMLODIPINE BESYLATE 10 MG PO TABS
10.0000 mg | ORAL_TABLET | Freq: Every day | ORAL | Status: DC
  Administered 2020-06-07 – 2020-06-10 (×4): 10 mg via ORAL
  Filled 2020-06-06 (×4): qty 1

## 2020-06-06 NOTE — Progress Notes (Signed)
Subjective:  Patient denies any chest pain or shortness of breath.  States right lower abdominal pain improved after taking tramadol.  Hemoglobin stable since yesterday.  Objective:  Vital Signs in the last 24 hours: Temp:  [97.2 F (36.2 C)-98.4 F (36.9 C)] 98.4 F (36.9 C) (08/23 0529) Pulse Rate:  [71-75] 75 (08/23 0529) Resp:  [18-20] 20 (08/23 0529) BP: (159-192)/(63-75) 192/74 (08/23 0529) SpO2:  [96 %-98 %] 96 % (08/23 0529) Weight:  [117.1 kg] 117.1 kg (08/23 0500)  Intake/Output from previous day: 08/22 0701 - 08/23 0700 In: 342 [P.O.:342] Out: -  Intake/Output from this shift: Total I/O In: 120 [P.O.:120] Out: -   Physical Exam: Neck: no adenopathy, no carotid bruit, no JVD and supple, symmetrical, trachea midline Lungs: clear to auscultation anterolaterally Heart: regular rate and rhythm, S1, S2 normal and soft systolic murmur noted Abdomen: soft, distended large right lower quadrant anterior abdominal wall hematoma with ecchymosis noted Extremities: extremities normal, atraumatic, no cyanosis or edema  Lab Results: Recent Labs    06/05/20 0457 06/06/20 0421  WBC 14.8* 14.4*  HGB 8.4* 8.6*  PLT 368 406*   Recent Labs    06/06/20 0421  NA 138  K 4.2  CL 103  CO2 27  GLUCOSE 110*  BUN 14  CREATININE 0.91   No results for input(s): TROPONINI in the last 72 hours.  Invalid input(s): CK, MB Hepatic Function Panel No results for input(s): PROT, ALBUMIN, AST, ALT, ALKPHOS, BILITOT, BILIDIR, IBILI in the last 72 hours. No results for input(s): CHOL in the last 72 hours. No results for input(s): PROTIME in the last 72 hours.  Imaging: Imaging results have been reviewed and CT ABDOMEN PELVIS WO CONTRAST  Result Date: 06/05/2020 CLINICAL DATA:  Pain.  Follow-up hematoma. EXAM: CT ABDOMEN AND PELVIS WITHOUT CONTRAST TECHNIQUE: Multidetector CT imaging of the abdomen and pelvis was performed following the standard protocol without IV contrast. COMPARISON:   May 27, 2020 FINDINGS: Lower chest: Consolidation in the lower lobe seen previously has largely resolved, consistent with resolving atelectasis. No acute abnormalities are seen in the lower chest. Hepatobiliary: No focal liver abnormality is seen. No gallstones, gallbladder wall thickening, or biliary dilatation. Pancreas: Unremarkable. No pancreatic ductal dilatation or surrounding inflammatory changes. Spleen: Normal in size without focal abnormality. Adrenals/Urinary Tract: A left renal cyst is again identified. The adrenal glands, kidneys, ureters, and bladder are otherwise normal. Stomach/Bowel: The stomach and small bowel are normal. Diverticulosis without diverticulitis. The appendix is unremarkable. Vascular/Lymphatic: Atherosclerosis in the nonaneurysmal aorta. No adenopathy. Reproductive: Uterus and bilateral adnexa are unremarkable. Other: No free air free fluid. Right groin hematoma extending into the right lower anterior pelvic wall persists. The hematoma in the right anterior groin measures 12 by 8 cm today versus 12 x 5.7 cm previously. The component of hematoma in the anterior right pelvic wall is similar in attenuation to surrounding muscle and difficult to measure with certainty. This portion of hematoma measures approximately 7.6 x 3.0 cm today versus 7.8 x 3.7 cm previously. The anterior aspect of the hematoma as seen on coronal image 28 appears larger in the interval measuring 13.6 x 7.4 cm today versus 13.2 by 5.5 cm previously. The more posterior aspect associated with the anterior musculature is more difficult to measure as it is now similar in attenuation to the adjacent musculature. However, this portion of hematoma appears smaller in the interval. The cranial caudal dimension measures 17.5 cm today versus 17.8 cm previously. Musculoskeletal: Right hip replacement.  No other acute bony abnormalities. IMPRESSION: 1. The anterior aspect of the hematoma in the anterior pelvis is larger in  the interval. The hematoma with in the anterior musculature is a little smaller in the interval. 2. Interval resolution of bibasilar consolidation. 3. Diverticulosis without diverticulitis. 4. Atherosclerosis in the nonaneurysmal aorta. Electronically Signed   By: Dorise Bullion III M.D   On: 06/05/2020 11:28   DG Abd 1 View  Result Date: 06/04/2020 CLINICAL DATA:  Right-sided abdominal pain EXAM: ABDOMEN - 1 VIEW COMPARISON:  None. FINDINGS: The bowel gas pattern is normal. No radio-opaque calculi or other significant radiographic abnormality are seen. IMPRESSION: Negative. Electronically Signed   By: Dorise Bullion III M.D   On: 06/04/2020 15:18    Cardiac Studies:  Assessment/Plan:  Right anterior abdominal wall/pelvis hematoma Right anterior pelvic musculature hematoma Multivessel cad Recent LAD stent Anemia of blood loss Morbid obesity Type 2 DM Recent ischemic stroke Altered mental status Paroxysmal atrial fibrillation OSA Plan Continue present management. Discussed with patient and her daughter regarding dual antiplatelet medication.  This risk and benefits and agrees to continue dual antiplatelet medication.  Monitor CBC. Please call if needed.  Thank you  LOS: 5 days    Charolette Forward 06/06/2020, 10:15 AM

## 2020-06-06 NOTE — Progress Notes (Signed)
Applied Kpad to right lower abdomen.

## 2020-06-06 NOTE — Progress Notes (Signed)
Speech Language Pathology Discharge Summary  Patient Details  Name: Sherry Fitzpatrick MRN: [id number] Date of Birth: March 20, 1951  Today's Date: 06/06/2020 SLP Individual Time: 0805-0900 SLP Individual Time Calculation (min): 55 min   Skilled Therapeutic Interventions:  Skilled ST services focused on education and cognitive skills. Pt's daughter was present for treatment session. Pt expressed being at cognitive baseline and no further need for ST services and pt's daughter then supported statement. SLP facilitated reassessment of cognitive linguistic skills administering Cognistat, pt scored WFL. Pt's daughter agreed to continue assistance with medication and finical management. SLP provided  Education was provided pertaining to short term recall and complex problem solving, memory sheet provided. All questions answered to satisfaction. No further ST services needed at this time.     Patient has met 3 of 3 long term goals.  Patient to discharge at overall Modified Independent level.  Reasons goals not met:   Pt made great progress meeting 3 out 3 goals discharging at mod I. Pt demonstrated improvement in vocal intensity, semi-complex to complex problem solving and short term recall in functional task such as medication management and in formal assessment. Pt scored WFL on formal cognitive linguistic assessment Cognistat. Pt's daughter assisted with medication/money management prior to admission and will continue assistance if needed. Pt and pt's daughter both support vocal and cognitive baseline. No further ST services needed at this time.  Clinical Impression/Discharge Summary:    Care Partner:  Caregiver Able to Provide Assistance: Yes     Recommendation:  None      Equipment: N/A   Reasons for discharge: Treatment goals met   Patient/Family Agrees with Progress Made and Goals Achieved: Yes    [name] 06/06/2020, 4:30 PM

## 2020-06-06 NOTE — Progress Notes (Signed)
Occupational Therapy Session Note  Patient Details  Name: Sherry Fitzpatrick MRN: [id number] Date of Birth: 04-27-1951  Today's Date: 06/06/2020 OT Individual Time: 3151-7616 OT Individual Time Calculation (min): 82 min    Short Term Goals: Week 1:  OT Short Term Goal 1 (Week 1): Pt will dress LB using AE as needed with min assist OT Short Term Goal 2 (Week 1): Pt will complete toilet transfer with CGA OT Short Term Goal 3 (Week 1): Pt will complete LB bathing using AE as needed with min assist  Skilled Therapeutic Interventions/Progress Updates:      Pt seen for BADL retraining of toileting, bathing, and dressing with a focus on mobility and activity tolerance. Pt received in bed with her daughter in the room.  Pt needed to toilet right away.   Supervision with bed mobility, sit to stand to RW and ambulation in and out of bathroom, including toilet transfers. Pt needed cues to breathe, take breaks, move slowly as she does get out of breath easily.  She did have a wet brief but was able to urinate more on the toilet.   Ambulated to sink in room and sat in arm chair to complete UB bathing and dressing with set up and cues for rest breaks.  Pt able to stand to reach perineal area and buttocks.  As pain increased in abdomen, pt needed A to wash lower legs and feet.   Her daughter had to leave, but had confirmed home equipment and will bring her rollater in from home.  Pt felt the need to have a BM so returned to toilet. After sitting a few minutes, she only needed to urinate again.   Introduced a Public affairs consultant to pt but did not have time to practice with her.  Assisted her in donning her pull ups but they were too small and ripped so obtained a hospital brief.  Then assisted her with pants but once she ambulated in room she said the sweatpants were too tight and wanted to remove them right away.  Pt transitioned to bed.  For her afternoon PT session she will need XXL clothing for top and  bottom as the XL shirt is also too tight.      Her daughter had taken her regular clothing home to be washed and pt and dtr had a discussion about it before she left but at the end of the session, pt asked "oh did my daughter take my clothes home?"  Pt also thought today was Friday despite conversation about her granddaughter's first day of school today. Pt was more distracted due to pain today.  Otherwise, pt followed directions well and had a good understanding of her safety needs.   Pt in bed with alarm set and NT called to continue assisting pt with getting settled in bed as session time had run over.   Therapy Documentation Precautions:  Precautions Precautions: Fall Precaution Comments: monitor BP; ensure medicated prior to treatment Restrictions Weight Bearing Restrictions: No Other Position/Activity Restrictions: R lower abdominal hematoma causing significant pain with mobility    Vital Signs: Oxygen Therapy O2 Device: Room Air Pain: Pain Assessment Pain Scale: 0-10 Pain Score: 6  - lower R abdomen, premedicated ADL: ADL Eating: Independent Where Assessed-Eating: Bed level Grooming: Setup Where Assessed-Grooming: Sitting at sink Upper Body Bathing: Minimal assistance Where Assessed-Upper Body Bathing: Sitting at sink Lower Body Bathing: Moderate assistance Where Assessed-Lower Body Bathing: Sitting at sink Upper Body Dressing: Minimal assistance Where Assessed-Upper  Body Dressing: Bed level Lower Body Dressing: Maximal assistance Where Assessed-Lower Body Dressing: Bed level Toileting: Minimal assistance Where Assessed-Toileting: Bedside Commode Toilet Transfer: Minimal assistance Toilet Transfer Method: Stand pivot Toilet Transfer Equipment: Bedside commode  Therapy/Group: Individual Therapy  [place] 06/06/2020, 9:54 AM

## 2020-06-06 NOTE — Progress Notes (Signed)
Physical Therapy Session Note  Patient Details  Name: Sherry Fitzpatrick MRN: [id number] Date of Birth: 1951-08-14  Today's Date: 06/06/2020 PT Individual Time: 1300-1345 PT Individual Time Calculation (min): 45 min   Short Term Goals: Week 1:  PT Short Term Goal 1 (Week 1): Pt will perform bed mobility with minA PT Short Term Goal 2 (Week 1): Pt will perform sit<>stand transfers with LRAD and minA PT Short Term Goal 3 (Week 1): Pt will ambulate 61ft with LRAD and minA PT Short Term Goal 4 (Week 1): Pt will go up/down x4 steps with modA  Skilled Therapeutic Interventions/Progress Updates:    Pt received supine in bed, just finished her meal. She initially was very hesitant to participate, required extra effort and time for motivating to participate. Pt eventually agreeable to PT session. Pt reports RLQ abdominal pain, does not rate, associated primarily with movement and she defers pain medication. Pt performed supine<>sit with minA and HOB elevated, donned shirt while seated EOB with supervision, required minA for lower body dressing while threading RLE and pulling pants up while standing. Pt ambulated ~179ft with CGA and no AD in hallways, demo's step-to gait pattern with wide BOS, increased lateral sway ("waddling" gait pattern). Increased work of breathing after gait however recovers quickly with seated rest. Transported patient outside near [facility] in w/c with totalA for time management where she ambulated ~45ft with CGA and no AD up/down moderate incline, cues for increasing step length and adjusting her center of gravity to accommodate incline. After seated rest, pt performed 1x5 sit<>stands from w/c with no AD with supervision and 1x5 sit<>stands from w/c without BUE support. Pt then transported back to her room, performed stand-pivot to recliner with supervision and no AD. Ended session with feet elevated, needs in reach, pt comfortable.    Therapy Documentation Precautions:   Precautions Precautions: Fall Precaution Comments: monitor BP; ensure medicated prior to treatment Restrictions Weight Bearing Restrictions: No Other Position/Activity Restrictions: R lower abdominal hematoma causing significant pain with mobility Vital Signs: Oxygen Therapy O2 Device: Room Air Pain: Pain Assessment Pain Scale: 0-10 Pain Score: 6   Therapy/Group: Individual Therapy  Silvana Holecek P Aaro Meyers  PT 06/06/2020, 12:56 PM

## 2020-06-06 NOTE — Progress Notes (Signed)
[facility] PHYSICAL MEDICINE & REHABILITATION PROGRESS NOTE  Subjective/Complaints: Still complains of right groin pain.  No other complaints.  Doing well with therapy Appreciate cardiology consult- recommended continued medical management  ROS: Patient denies fever, rash, sore throat, blurred vision, nausea, vomiting, diarrhea,  shortness of breath or chest pain,   headache, or mood change.    Objective: Vital Signs: Blood pressure (!) 192/74, pulse 75, temperature 98.4 F (36.9 C), temperature source Oral, resp. rate 20, height 5\' 4"  (1.626 m), weight 117.1 kg, SpO2 96 %. CT ABDOMEN PELVIS WO CONTRAST  Result Date: 06/05/2020 CLINICAL DATA:  Pain.  Follow-up hematoma. EXAM: CT ABDOMEN AND PELVIS WITHOUT CONTRAST TECHNIQUE: Multidetector CT imaging of the abdomen and pelvis was performed following the standard protocol without IV contrast. COMPARISON:  May 27, 2020 FINDINGS: Lower chest: Consolidation in the lower lobe seen previously has largely resolved, consistent with resolving atelectasis. No acute abnormalities are seen in the lower chest. Hepatobiliary: No focal liver abnormality is seen. No gallstones, gallbladder wall thickening, or biliary dilatation. Pancreas: Unremarkable. No pancreatic ductal dilatation or surrounding inflammatory changes. Spleen: Normal in size without focal abnormality. Adrenals/Urinary Tract: A left renal cyst is again identified. The adrenal glands, kidneys, ureters, and bladder are otherwise normal. Stomach/Bowel: The stomach and small bowel are normal. Diverticulosis without diverticulitis. The appendix is unremarkable. Vascular/Lymphatic: Atherosclerosis in the nonaneurysmal aorta. No adenopathy. Reproductive: Uterus and bilateral adnexa are unremarkable. Other: No free air free fluid. Right groin hematoma extending into the right lower anterior pelvic wall persists. The hematoma in the right anterior groin measures 12 by 8 cm today versus 12 x 5.7 cm  previously. The component of hematoma in the anterior right pelvic wall is similar in attenuation to surrounding muscle and difficult to measure with certainty. This portion of hematoma measures approximately 7.6 x 3.0 cm today versus 7.8 x 3.7 cm previously. The anterior aspect of the hematoma as seen on coronal image 28 appears larger in the interval measuring 13.6 x 7.4 cm today versus 13.2 by 5.5 cm previously. The more posterior aspect associated with the anterior musculature is more difficult to measure as it is now similar in attenuation to the adjacent musculature. However, this portion of hematoma appears smaller in the interval. The cranial caudal dimension measures 17.5 cm today versus 17.8 cm previously. Musculoskeletal: Right hip replacement. No other acute bony abnormalities. IMPRESSION: 1. The anterior aspect of the hematoma in the anterior pelvis is larger in the interval. The hematoma with in the anterior musculature is a little smaller in the interval. 2. Interval resolution of bibasilar consolidation. 3. Diverticulosis without diverticulitis. 4. Atherosclerosis in the nonaneurysmal aorta. Electronically Signed   By: Dorise Bullion III M.D   On: 06/05/2020 11:28   DG Abd 1 View  Result Date: 06/04/2020 CLINICAL DATA:  Right-sided abdominal pain EXAM: ABDOMEN - 1 VIEW COMPARISON:  None. FINDINGS: The bowel gas pattern is normal. No radio-opaque calculi or other significant radiographic abnormality are seen. IMPRESSION: Negative. Electronically Signed   By: Dorise Bullion III M.D   On: 06/04/2020 15:18   Recent Labs    06/05/20 0457 06/06/20 0421  WBC 14.8* 14.4*  HGB 8.4* 8.6*  HCT 26.6* 27.7*  PLT 368 406*   Recent Labs    06/06/20 0421  NA 138  K 4.2  CL 103  CO2 27  GLUCOSE 110*  BUN 14  CREATININE 0.91  CALCIUM 8.4*    Physical Exam: BP (!) 192/74 (BP Location: Left Arm)  Pulse 75   Temp 98.4 F (36.9 C) (Oral)   Resp 20   Ht 5\' 4"  (1.626 m)   Wt 117.1 kg    SpO2 96%   BMI 44.31 kg/m  General: Alert and oriented x 3, No apparent distress HEENT: Head is normocephalic, atraumatic, PERRLA, EOMI, sclera anicteric, oral mucosa pink and moist, dentition intact, ext ear canals clear,  Neck: Supple without JVD or lymphadenopathy Heart: Reg rate and rhythm. No murmurs rubs or gallops Chest: CTA bilaterally without wheezes, rales, or rhonchi; no distress GI/Abdomen: BS +. Continued mass RLQ, groin. More easily appreciated while she's lying in bed. Tender to palpation, firm.  Ext: no clubbing, cyanosis, or edema Psych: pleasant and cooperative Skin: Warm and dry.  Intact. Psych: Normal mood.  Normal behavior. Musc: No edema in extremities.  No tenderness in extremities. Neuro: Alert and oriented Dysarthria, improving Motor: 4+/5 with pain inhibition in LE's   Assessment/Plan: 1. Functional deficits secondary to bilateral cerebral/cerebellar infarcts after cardiac cath which require 3+ hours per day of interdisciplinary therapy in a comprehensive inpatient rehab setting.  Physiatrist is providing close team supervision and 24 hour management of active medical problems listed below.  Physiatrist and rehab team continue to assess barriers to discharge/monitor patient progress toward functional and medical goals  Care Tool:  Bathing    Body parts bathed by patient: Right arm, Left arm, Chest, Abdomen, Face   Body parts bathed by helper: Front perineal area, Buttocks     Bathing assist Assist Level: Moderate Assistance - Patient 50 - 74%     Upper Body Dressing/Undressing Upper body dressing   What is the patient wearing?: Pull over shirt    Upper body assist Assist Level: Set up assist    Lower Body Dressing/Undressing Lower body dressing      What is the patient wearing?: Pants     Lower body assist Assist for lower body dressing: Minimal Assistance - Patient > 75%     Toileting Toileting    Toileting assist Assist for  toileting: Moderate Assistance - Patient 50 - 74%     Transfers Chair/bed transfer  Transfers assist     Chair/bed transfer assist level: Contact Guard/Touching assist     Locomotion Ambulation   Ambulation assist      Assist level: Contact Guard/Touching assist Assistive device: Walker-rolling Max distance: 130'   Walk 10 feet activity   Assist     Assist level: Contact Guard/Touching assist Assistive device: Walker-rolling   Walk 50 feet activity   Assist Walk 50 feet with 2 turns activity did not occur: Safety/medical concerns (fatigued after 68ft)  Assist level: Contact Guard/Touching assist Assistive device: Walker-rolling    Walk 150 feet activity   Assist Walk 150 feet activity did not occur: Safety/medical concerns         Walk 10 feet on uneven surface  activity   Assist Walk 10 feet on uneven surfaces activity did not occur: Safety/medical concerns   Assist level: Contact Guard/Touching assist Assistive device: Other (comment) (no AD)   Wheelchair     Assist Will patient use wheelchair at discharge?: No             Wheelchair 50 feet with 2 turns activity    Assist            Wheelchair 150 feet activity     Assist           BP (!) 192/74 (BP Location: Left Arm)  Pulse 75   Temp 98.4 F (36.9 C) (Oral)   Resp 20   Ht 5\' 4"  (1.626 m)   Wt 117.1 kg   SpO2 96%   BMI 44.31 kg/m   Medical Problem List and Plan: 1.  Altered mental status with dysarthria secondary to bilateral cerebral/cerebellar after cardiac catheterization/PAF  Continue CIR 2.  Antithrombotics: -DVT/anticoagulation: SCDs             -antiplatelet therapy: Aspirin 81 mg daily and Plavix 75 mg daily 3. Pain Management: Tylenol as needed. RLQ hematoma continues to be painful.  4. Mood: Provide emotional support             -antipsychotic agents: N/A 5. Neuropsych: This patient is capable of making decisions on her own behalf. 6.  Skin/Wound Care: Routine skin checks 7. Fluids/Electrolytes/Nutrition: Routine in and outs. 8. Labile blood pressure:   8/23: BP elevated: increase amlodipine to 10mg .  9.  Diabetes mellitus with hyperglycemia.  Hemoglobin A1c 7.3.  NovoLog 4 units every 4 hours.  Check blood sugars as directed.  Well controlled 10.  Systolic congestive heart failure. Imdur 30 mg daily.  Monitor for any signs of fluid overload  Daily weights ordered Filed Weights   06/04/20 0451 06/05/20 0500 06/06/20 0500  Weight: 117.3 kg 115.2 kg 117.1 kg  11.  Morbid obesity.  BMI 44.24.  Dietary follow-up.  Encourage weight loss 12.  Hyperlipidemia.  Lipitor 13.  CAP.  IV Rocephin completed.  Check oxygen saturations every shift 14.  Post stroke dysphagia:   Now on regular thins  Advance diet as tolerated 15.  Leukocytosis  WBCs 14.6 on 8/19  Afebrile 16. Atrial fibrillation  Continue amiodarone 100 mg daily.  17.  Acute blood loss anemia  Hemoglobin 8.8 on 8/19  Continue to monitor 18.  Hypokalemia  Potassium 3.3 on 8/19  Supplemented increased on 8/20  Labs ordered for Monday  19.  Hypoalbuminemia  Supplement initiated on 8/20 20. Right groin hematoma first noted after cardiac cath.   8/22-reviewed recent abd CT which demonstrated a large right groin hematoma. hgb slowly trending down-->8.4 today   -repeat CT abdomen this morning, pelvis demonstrates ongoing hematoma which is larger when compared to study 8/13   -tramadol for pain, kpad   -Appreciate cardiology's eval. Continue ASA/Plavix given recent stent.    LOS: 5 days A FACE TO FACE EVALUATION WAS PERFORMED  Martha Clan P Amika Tassin 06/06/2020, 11:19 AM

## 2020-06-07 ENCOUNTER — Inpatient Hospital Stay (HOSPITAL_COMMUNITY): Payer: Medicare HMO | Admitting: Occupational Therapy

## 2020-06-07 ENCOUNTER — Inpatient Hospital Stay (HOSPITAL_COMMUNITY): Payer: Medicare HMO

## 2020-06-07 LAB — GLUCOSE, CAPILLARY
Glucose-Capillary: 123 mg/dL — ABNORMAL HIGH (ref 70–99)
Glucose-Capillary: 126 mg/dL — ABNORMAL HIGH (ref 70–99)
Glucose-Capillary: 124 mg/dL — ABNORMAL HIGH (ref 70–99)
Glucose-Capillary: 128 mg/dL — ABNORMAL HIGH (ref 70–99)

## 2020-06-07 MED ORDER — ISOSORBIDE MONONITRATE ER 30 MG PO TB24
60.0000 mg | ORAL_TABLET | Freq: Every day | ORAL | Status: DC
  Administered 2020-06-07: 60 mg via ORAL

## 2020-06-07 NOTE — Progress Notes (Signed)
Occupational Therapy Session Note  Patient Details  Name: Sherry Fitzpatrick MRN: [id number] Date of Birth: 16-Jul-1951  Today's Date: 06/07/2020 OT Individual Time:  -       Short Term Goals: Week 1:  OT Short Term Goal 1 (Week 1): Pt will dress LB using AE as needed with min assist OT Short Term Goal 2 (Week 1): Pt will complete toilet transfer with CGA OT Short Term Goal 3 (Week 1): Pt will complete LB bathing using AE as needed with min assist  Skilled Therapeutic Interventions/Progress Updates:    1:! Pt c/o no pain but fatigue and desiring to remain in bed resting. Pt reported a lot of interruptions and wanted to rest.   Therapy Documentation Precautions:  Precautions Precautions: Fall Precaution Comments: monitor BP; ensure medicated prior to treatment Restrictions Weight Bearing Restrictions: No Other Position/Activity Restrictions: R lower abdominal hematoma causing significant pain with mobility General: General OT Amount of Missed Time: 30 Minutes   Therapy/Group: Individual Therapy  Willeen Cass [facility] 06/07/2020, 3:01 PM

## 2020-06-07 NOTE — Plan of Care (Signed)
  Problem: Consults Goal: RH GENERAL PATIENT EDUCATION Description: See Patient Education module for education specifics. Outcome: Progressing   Problem: RH BOWEL ELIMINATION Goal: RH STG MANAGE BOWEL WITH ASSISTANCE Description: STG Manage Bowel with min Assistance. Outcome: Progressing Goal: RH STG MANAGE BOWEL W/MEDICATION W/ASSISTANCE Description: STG Manage Bowel with Medication with min Assistance. Outcome: Progressing   Problem: RH BLADDER ELIMINATION Goal: RH STG MANAGE BLADDER WITH ASSISTANCE Description: STG Manage Bladder With min Assistance Outcome: Progressing   Problem: RH SKIN INTEGRITY Goal: RH STG SKIN FREE OF INFECTION/BREAKDOWN Description: Skin will be free of infection/breakdown with min assist Outcome: Progressing Goal: RH STG MAINTAIN SKIN INTEGRITY WITH ASSISTANCE Description: STG Maintain Skin Integrity With min Assistance. Outcome: Progressing Goal: RH STG ABLE TO PERFORM INCISION/WOUND CARE W/ASSISTANCE Description: STG Able To Perform Incision/Wound Care With min Assistance. Outcome: Progressing   Problem: RH PAIN MANAGEMENT Goal: RH STG PAIN MANAGED AT OR BELOW PT'S PAIN GOAL Description: Pain will be managed at or below 3 out of 10 with min assist Outcome: Progressing   Problem: RH KNOWLEDGE DEFICIT GENERAL Goal: RH STG INCREASE KNOWLEDGE OF SELF CARE AFTER HOSPITALIZATION Description: Pt will be able to verbalize self care when returning home with min assist Outcome: Progressing   Problem: Consults Goal: RH STROKE PATIENT EDUCATION Description: See Patient Education module for education specifics  Outcome: Progressing   Problem: RH KNOWLEDGE DEFICIT Goal: RH STG INCREASE KNOWLEDGE OF DIABETES Description: Patient will be able to manage DM including medications and dietary restrictions, CMM diet and exercise along with survival skills using handouts and educational resources with min assist Outcome: Progressing Goal: RH STG INCREASE  KNOWLEDGE OF HYPERTENSION Description: Patient will be able to manage HTN including medications and dietary restrictions, CMM diet and exercise using handouts and educational resources with min assist Outcome: Progressing Goal: RH STG INCREASE KNOWLEGDE OF HYPERLIPIDEMIA Description: Patient will be able to manage HLD including medications and dietary restrictions, CMM diet and exercise using handouts and educational resources with min assist Outcome: Progressing Goal: RH STG INCREASE KNOWLEDGE OF STROKE PROPHYLAXIS Description: Patient will be able to manage stroke prophylaxis and medication regimen using handouts and educational resources with min assist Outcome: Progressing   Problem: Education: Goal: Ability to demonstrate management of disease process will improve Description: Patient will be able to manage HF including medications and dietary restrictions, and exercise using handouts and educational resources with min assist Outcome: Progressing Goal: Ability to verbalize understanding of medication therapies will improve Outcome: Progressing Goal: Individualized Educational Video(s) Outcome: Progressing

## 2020-06-07 NOTE — Progress Notes (Signed)
Physical Therapy Session Note  Patient Details  Name: Sherry Fitzpatrick MRN: [id number] Date of Birth: Oct 30, 1950  Today's Date: 06/07/2020 PT Individual Time: 1425-1503 PT Individual Time Calculation (min): 38 min   Short Term Goals: Week 1:  PT Short Term Goal 1 (Week 1): Pt will perform bed mobility with minA PT Short Term Goal 2 (Week 1): Pt will perform sit<>stand transfers with LRAD and minA PT Short Term Goal 3 (Week 1): Pt will ambulate 64ft with LRAD and minA PT Short Term Goal 4 (Week 1): Pt will go up/down x4 steps with modA  Skilled Therapeutic Interventions/Progress Updates:    Session focused on family education with patient and daughter in preparation for upcoming d/c. Per primary, still needed to cover tub bench transfers and use of reacher for dressing. Demonstrated and explained use of reacher during dressing for lower body or items to reach from floor. Pt reports being familiar with concept, declines to practice during this session, but educated daughter on use. Practiced tub bench transfer in ADL apartment, with patient performing transfer with supervision. Discussed personal home set-up and may be a challenge due to having a sliding glass door feature. Daughter to check on fit of tub bench in her bathroom and options. Will plan to follow back up with primary team. Pt performed transfers throughout session with supervision without AD and extra time due to pain. End of session returned to bed with supervision and repositioned in supine with extra time. Daughter concerned with size of hematoma and notified RN of her concerns.  Therapy Documentation Precautions:  Precautions Precautions: Fall Precaution Comments: monitor BP; ensure medicated prior to treatment Restrictions Weight Bearing Restrictions: No Other Position/Activity Restrictions: R lower abdominal hematoma causing significant pain with mobility Pain: Using heating pad for pain in abdomen. RN  aware.    Therapy/Group: Individual Therapy  Canary Brim Ivory Broad, PT, DPT, CBIS  06/07/2020, 3:14 PM

## 2020-06-07 NOTE — Progress Notes (Signed)
[facility] PHYSICAL MEDICINE & REHABILITATION PROGRESS NOTE  Subjective/Complaints: Pain in right lower quadrant is still present but tolerable. Kpad applied.  Ate most of breakfast. Has no complaints  ROS: Patient denies fever, rash, sore throat, blurred vision, nausea, vomiting, diarrhea,  shortness of breath or chest pain,   headache, or mood change.    Objective: Vital Signs: Blood pressure (!) 184/73, pulse 88, temperature 98.6 F (37 C), temperature source Oral, resp. rate 20, height 5\' 4"  (1.626 m), weight 117.1 kg, SpO2 98 %. No results found. Recent Labs    06/05/20 0457 06/06/20 0421  WBC 14.8* 14.4*  HGB 8.4* 8.6*  HCT 26.6* 27.7*  PLT 368 406*   Recent Labs    06/06/20 0421  NA 138  K 4.2  CL 103  CO2 27  GLUCOSE 110*  BUN 14  CREATININE 0.91  CALCIUM 8.4*    Physical Exam: BP (!) 184/73 (BP Location: Left Arm)   Pulse 88   Temp 98.6 F (37 C) (Oral)   Resp 20   Ht 5\' 4"  (1.626 m)   Wt 117.1 kg   SpO2 98%   BMI 44.31 kg/m   General: Alert and oriented x 3, No apparent distress HEENT: Head is normocephalic, atraumatic, PERRLA, EOMI, sclera anicteric, oral mucosa pink and moist, dentition intact, ext ear canals clear,  Neck: Supple without JVD or lymphadenopathy Heart: Reg rate and rhythm. No murmurs rubs or gallops Chest: CTA bilaterally without wheezes, rales, or rhonchi; no distress GI/Abdomen: BS +. Continued mass RLQ, groin. More easily appreciated while she's lying in bed. Tender to palpation, firm. Kpad applied Ext: no clubbing, cyanosis, or edema Psych: pleasant and cooperative Skin: Warm and dry.  Intact. Psych: Normal mood.  Normal behavior. Musc: No edema in extremities.  No tenderness in extremities. Neuro: Alert and oriented Dysarthria, improving Motor: 4+/5 with pain inhibition in LE's    Assessment/Plan: 1. Functional deficits secondary to bilateral cerebral/cerebellar infarcts after cardiac cath which require 3+ hours per day  of interdisciplinary therapy in a comprehensive inpatient rehab setting.  Physiatrist is providing close team supervision and 24 hour management of active medical problems listed below.  Physiatrist and rehab team continue to assess barriers to discharge/monitor patient progress toward functional and medical goals  Care Tool:  Bathing    Body parts bathed by patient: Right arm, Left arm, Chest, Abdomen, Face, Front perineal area, Buttocks, Right upper leg, Left upper leg   Body parts bathed by helper: Right lower leg, Left lower leg     Bathing assist Assist Level: Minimal Assistance - Patient > 75%     Upper Body Dressing/Undressing Upper body dressing   What is the patient wearing?: Pull over shirt    Upper body assist Assist Level: Set up assist    Lower Body Dressing/Undressing Lower body dressing      What is the patient wearing?: Underwear/pull up, Pants     Lower body assist Assist for lower body dressing: Moderate Assistance - Patient 50 - 74%     Toileting Toileting    Toileting assist Assist for toileting: Moderate Assistance - Patient 50 - 74%     Transfers Chair/bed transfer  Transfers assist     Chair/bed transfer assist level: Supervision/Verbal cueing     Locomotion Ambulation   Ambulation assist      Assist level: Contact Guard/Touching assist Assistive device: Walker-rolling Max distance: 130'   Walk 10 feet activity   Assist     Assist  level: Contact Guard/Touching assist Assistive device: Walker-rolling   Walk 50 feet activity   Assist Walk 50 feet with 2 turns activity did not occur: Safety/medical concerns (fatigued after 50ft)  Assist level: Contact Guard/Touching assist Assistive device: Walker-rolling    Walk 150 feet activity   Assist Walk 150 feet activity did not occur: Safety/medical concerns         Walk 10 feet on uneven surface  activity   Assist Walk 10 feet on uneven surfaces activity did not  occur: Safety/medical concerns   Assist level: Contact Guard/Touching assist Assistive device: Other (comment) (no AD)   Wheelchair     Assist Will patient use wheelchair at discharge?: No             Wheelchair 50 feet with 2 turns activity    Assist            Wheelchair 150 feet activity     Assist           BP (!) 184/73 (BP Location: Left Arm)   Pulse 88   Temp 98.6 F (37 C) (Oral)   Resp 20   Ht 5\' 4"  (1.626 m)   Wt 117.1 kg   SpO2 98%   BMI 44.31 kg/m   Medical Problem List and Plan: 1.  Altered mental status with dysarthria secondary to bilateral cerebral/cerebellar after cardiac catheterization/PAF  Continue CIR 2.  Antithrombotics: -DVT/anticoagulation: SCDs             -antiplatelet therapy: Aspirin 81 mg daily and Plavix 75 mg daily 3. Pain Management: Tylenol as needed. RLQ hematoma continues to be painful. Kpad applied. Has Tramadol prn.  4. Mood: Provide emotional support             -antipsychotic agents: N/A 5. Neuropsych: This patient is capable of making decisions on her own behalf. 6. Skin/Wound Care: Routine skin checks 7. Fluids/Electrolytes/Nutrition: Routine in and outs. 8. HTN:  8/24: Continue amlodipine 10mg . Increase Imdur to 60mg .  9.  Diabetes mellitus with hyperglycemia.  Hemoglobin A1c 7.3.  NovoLog 4 units every 4 hours.  Check blood sugars as directed.  Well controlled 10.  Systolic congestive heart failure. Imdur 30 mg daily.  Monitor for any signs of fluid overload  Daily weights ordered Filed Weights   06/04/20 0451 06/05/20 0500 06/06/20 0500  Weight: 117.3 kg 115.2 kg 117.1 kg  11.  Morbid obesity.  BMI 44.24.  Dietary follow-up.  Encourage weight loss 12.  Hyperlipidemia.  Lipitor 13.  CAP.  IV Rocephin completed.  Check oxygen saturations every shift 14.  Post stroke dysphagia:   Now on regular thins  Advance diet as tolerated 15.  Leukocytosis  WBCs 14.6 on 8/19  Afebrile 16. Atrial  fibrillation  Continue amiodarone 100 mg daily.  17.  Acute blood loss anemia  Hemoglobin 8.8 on 8/19, 8/6 on 8/23  Continue to monitor 18.  Hypokalemia  Potassium 3.3 on 8/19  Supplemented increased on 8/20  K+ 4.2 on 8/23.   19.  Hypoalbuminemia  Supplement initiated on 8/20 20. Right groin hematoma first noted after cardiac cath.   8/22-reviewed recent abd CT which demonstrated a large right groin hematoma. hgb slowly trending down-->8.4 today   -repeat CT abdomen this morning, pelvis demonstrates ongoing hematoma which is larger when compared to study 8/13   -tramadol for pain, kpad   -Appreciate cardiology's eval. Continue ASA/Plavix given recent stent.    LOS: 6 days A FACE TO FACE EVALUATION WAS PERFORMED  Clide Deutscher Floraine Buechler 06/07/2020, 9:48 AM

## 2020-06-07 NOTE — Progress Notes (Signed)
Physical Therapy Session Note  Patient Details  Name: Sherry Fitzpatrick MRN: [id number] Date of Birth: [birth year]  Today's Date: 06/07/2020 PT Individual Time: 0930-1030 & 1300-1415 PT Individual Time Calculation (min): 60 min & 21min  Short Term Goals: Week 1:  PT Short Term Goal 1 (Week 1): Pt will perform bed mobility with minA PT Short Term Goal 2 (Week 1): Pt will perform sit<>stand transfers with LRAD and minA PT Short Term Goal 3 (Week 1): Pt will ambulate 88ft with LRAD and minA PT Short Term Goal 4 (Week 1): Pt will go up/down x4 steps with modA  Skilled Therapeutic Interventions/Progress Updates:     AM: Pt received supine in bed, sleeping on arrival, awakens easily to voice. Pt initially refusing therapy, contributes to fatigue and RLQ pain. Pt reports she had recently received pain medication including tylenol and tramadol from nursing staff. With extra time and effort, pt as eventually agreeable. Performed supine>sit with supervision (HOB elevated and use of HR's). Donned pants with minA via threading technique, increased difficulty with RLE > LLE 2/2 RLQ abdominal pain. Therapist donned socks with totalA for time management. Pt performed sit<>stand with supervision from EOB height to pull up her pants, ambulated with supervision and no AD to her bedroom sink where she washed her face and brushed her teeth with supervision. Noted pt to forward flex trunk and lean on elbows at sink when fatigued but was able to stand up straight when cued. Pt spent a total of ~10 minutes standing while doing self care tasks. Stand>sit with supervision to w/c where she donned scrub shirt with supervision while seated. Transported patient to stairs where she negotiated up/down x12 steps with supervision and BHR support, cues for sequencing and technique. After seated rest, pt ambulated ~130ft with supervision and RW around day room and nurses station, gait speed grossly decreased with step-to gait pattern and  decreased RLE weight shift. Pt reports moderate fatigue after ambulating 173ft, transported back to her room with totalA in w/c for time management and performed stand pivot transfer with supervision and no AD to her recliner. Pt reports she feels ready for DC, will have her daughter come this afternoon for family ed. Feet elevated, needs in reach, stryker heat pad on abdomen at end of session.  PM: Pt received sitting in recliner, agreeable to PT session, just finished her meal. Pt reports unrated RLQ abdominal pain. Daughter arriving soon after where focus of session was family ed. Daughter Sherry Fitzpatrick) educated on patient's current mobility status and progress she's accomplished in therapy, daughter appeared happy with progress. Pt performed sit<>stands from recliner height with supervision and no AD, requests toileting where she ambulated to her toilet with supervision and no AD, continent of voiding and able to perform pericare with supervision. Daughter present throughout session for family ed/training. Pt transported to stairs where she navigated up/down x4 steps with CGA and U HR support, spoke with daughter of safety approach and sequencing. Pt then taken to car where she performed car transfer with supervision, cues for technique. Pt ambulated ~11ft with supervision and rollator, cues for upright posture and breathing. Pt reports feeling more comfortable with RW rather than rollator, unsure if she has one at home. Pt then ambulated in ADL room with supervision and rollator, performed furniture transfer to couch with minA, transported back to her room where she performed stand-pivot transfer with supervision to recliner. All questions and concerns from daughter and patient addressed, needs in reach at end of session.  Therapy Documentation Precautions:  Precautions Precautions: Fall Precaution Comments: monitor BP; ensure medicated prior to treatment Restrictions Weight Bearing Restrictions:  No Other Position/Activity Restrictions: R lower abdominal hematoma causing significant pain with mobility   Therapy/Group: Individual Therapy  Sherry Fitzpatrick P Sherry Fitzpatrick PT 06/07/2020, 10:45 AM

## 2020-06-08 ENCOUNTER — Inpatient Hospital Stay (HOSPITAL_COMMUNITY): Payer: Medicare HMO

## 2020-06-08 ENCOUNTER — Telehealth (HOSPITAL_COMMUNITY): Payer: Self-pay

## 2020-06-08 ENCOUNTER — Inpatient Hospital Stay (HOSPITAL_COMMUNITY): Payer: Medicare HMO | Admitting: Physical Therapy

## 2020-06-08 ENCOUNTER — Inpatient Hospital Stay (HOSPITAL_COMMUNITY): Payer: Medicare HMO | Admitting: Occupational Therapy

## 2020-06-08 DIAGNOSIS — Z794 Long term (current) use of insulin: Secondary | ICD-10-CM

## 2020-06-08 DIAGNOSIS — I10 Essential (primary) hypertension: Secondary | ICD-10-CM

## 2020-06-08 LAB — GLUCOSE, CAPILLARY
Glucose-Capillary: 108 mg/dL — ABNORMAL HIGH (ref 70–99)
Glucose-Capillary: 122 mg/dL — ABNORMAL HIGH (ref 70–99)
Glucose-Capillary: 117 mg/dL — ABNORMAL HIGH (ref 70–99)
Glucose-Capillary: 135 mg/dL — ABNORMAL HIGH (ref 70–99)

## 2020-06-08 MED ORDER — ISOSORBIDE MONONITRATE ER 30 MG PO TB24
60.0000 mg | ORAL_TABLET | Freq: Every day | ORAL | Status: DC
  Administered 2020-06-08 – 2020-06-09 (×2): 60 mg via ORAL
  Filled 2020-06-08 (×2): qty 2

## 2020-06-08 MED ORDER — ISOSORBIDE MONONITRATE ER 30 MG PO TB24: 60.0000 mg | ORAL_TABLET | Freq: Every day | ORAL | Status: DC

## 2020-06-08 NOTE — Progress Notes (Signed)
[facility] PHYSICAL MEDICINE & REHABILITATION PROGRESS NOTE  Subjective/Complaints: Patient seen sitting up in bed this morning.  She states she slept fairly well overnight.  She notes abdominal pain present, but improving.  ROS: + Abdominal pain. Denies CP, SOB, N/V/D  Objective: Vital Signs: Blood pressure (!) 178/67, pulse 73, temperature 98.6 F (37 C), resp. rate 16, height 5\' 4"  (1.626 m), weight 117.1 kg, SpO2 93 %. No results found. Recent Labs    06/06/20 0421  WBC 14.4*  HGB 8.6*  HCT 27.7*  PLT 406*   Recent Labs    06/06/20 0421  NA 138  K 4.2  CL 103  CO2 27  GLUCOSE 110*  BUN 14  CREATININE 0.91  CALCIUM 8.4*    Physical Exam: BP (!) 178/67 (BP Location: Left Arm)   Pulse 73   Temp 98.6 F (37 C)   Resp 16   Ht 5\' 4"  (1.626 m)   Wt 117.1 kg   SpO2 93%   BMI 44.31 kg/m  Constitutional: No distress . Vital signs reviewed. HENT: Normocephalic.  Atraumatic. Eyes: EOMI. No discharge. Cardiovascular: No JVD.  RRR. Respiratory: Normal effort.  No stridor.  Bilateral clear to auscultation. GI: Right lower quadrant with tenderness and firmness..  BS slowed. Skin: Warm and dry.  Intact. Psych: Normal mood.  Normal behavior. Musc: No edema in extremities.  No tenderness in extremities. Neuro: Alert Dysarthria, stable Motor: 4+/5 with pain inhibition in LE's   Assessment/Plan: 1. Functional deficits secondary to bilateral cerebral/cerebellar infarcts after cardiac cath which require 3+ hours per day of interdisciplinary therapy in a comprehensive inpatient rehab setting.  Physiatrist is providing close team supervision and 24 hour management of active medical problems listed below.  Physiatrist and rehab team continue to assess barriers to discharge/monitor patient progress toward functional and medical goals  Care Tool:  Bathing    Body parts bathed by patient: Right arm, Left arm, Chest, Abdomen, Face, Front perineal area, Buttocks, Right upper  leg, Left upper leg   Body parts bathed by helper: Right lower leg, Left lower leg     Bathing assist Assist Level: Minimal Assistance - Patient > 75%     Upper Body Dressing/Undressing Upper body dressing   What is the patient wearing?: Pull over shirt    Upper body assist Assist Level: Set up assist    Lower Body Dressing/Undressing Lower body dressing      What is the patient wearing?: Underwear/pull up, Pants     Lower body assist Assist for lower body dressing: Moderate Assistance - Patient 50 - 74%     Toileting Toileting    Toileting assist Assist for toileting: Moderate Assistance - Patient 50 - 74%     Transfers Chair/bed transfer  Transfers assist     Chair/bed transfer assist level: Supervision/Verbal cueing     Locomotion Ambulation   Ambulation assist      Assist level: Supervision/Verbal cueing Assistive device: Walker-rolling Max distance: 142ft   Walk 10 feet activity   Assist     Assist level: Supervision/Verbal cueing Assistive device: Walker-rolling   Walk 50 feet activity   Assist Walk 50 feet with 2 turns activity did not occur: Safety/medical concerns (fatigued after 61ft)  Assist level: Supervision/Verbal cueing Assistive device: Walker-rolling    Walk 150 feet activity   Assist Walk 150 feet activity did not occur: Safety/medical concerns  Assist level: Supervision/Verbal cueing Assistive device: Walker-rolling    Walk 10 feet on uneven surface  activity   Assist Walk 10 feet on uneven surfaces activity did not occur: Safety/medical concerns   Assist level: Contact Guard/Touching assist Assistive device: Other (comment) (no AD)   Wheelchair     Assist Will patient use wheelchair at discharge?: No             Wheelchair 50 feet with 2 turns activity    Assist            Wheelchair 150 feet activity     Assist           BP (!) 178/67 (BP Location: Left Arm)   Pulse 73    Temp 98.6 F (37 C)   Resp 16   Ht 5\' 4"  (1.626 m)   Wt 117.1 kg   SpO2 93%   BMI 44.31 kg/m   Medical Problem List and Plan: 1.  Altered mental status with dysarthria secondary to bilateral cerebral/cerebellar after cardiac catheterization/PAF  Continue CIR  Team conference today to discuss current and goals and coordination of care, home and environmental barriers, and discharge planning with nursing, case manager, and therapies. Please see conference note from today as well.  2.  Antithrombotics: -DVT/anticoagulation: SCDs             -antiplatelet therapy: Aspirin 81 mg daily and Plavix 75 mg daily 3. Pain Management: Tylenol as needed.   RLQ hematoma pain improving on 8/25 4. Mood: Provide emotional support             -antipsychotic agents: N/A 5. Neuropsych: This patient is capable of making decisions on her own behalf. 6. Skin/Wound Care: Routine skin checks 7. Fluids/Electrolytes/Nutrition: Routine in and outs. 8. HTN:  Continue amlodipine 10mg   Increase Imdur to 60mg .    Very labile on 8/25, changed Imdur to at bedtime on 8/25 9.  Diabetes mellitus with hyperglycemia.  Hemoglobin A1c 7.3.  NovoLog 4 units every 4 hours.  Check blood sugars as directed.  Slightly elevated on 8/25 10.  Systolic congestive heart failure. Imdur 30 mg daily.  Monitor for any signs of fluid overload  Daily weights ordered Filed Weights   06/04/20 0451 06/05/20 0500 06/06/20 0500  Weight: 117.3 kg 115.2 kg 117.1 kg   Relatively stable on 8/23, no repeat weights 11.  Morbid obesity.  BMI 44.24.  Dietary follow-up.  Encourage weight loss 12.  Hyperlipidemia.  Lipitor 13.  CAP.  IV Rocephin completed.  Check oxygen saturations every shift 14.  Post stroke dysphagia:   Now on regular thins  Advance diet as tolerated 15.  Leukocytosis  WBCs 14.6 on 8/19  Afebrile 16. Atrial fibrillation  Continue amiodarone 100 mg daily.  17.  Acute blood loss anemia  Hemoglobin 8.6 on 8/23  Continue to  monitor 18.  Hypokalemia  Potassium 4.2 on 8/23, labs ordered for tomorrow  Supplemented increased on 8/20 19.  Hypoalbuminemia  Supplement initiated on 8/20 20. Right groin hematoma first noted after cardiac cath.   -Appreciate cardiology's eval. Continue ASA/Plavix given recent stent.    LOS: 7 days A FACE TO FACE EVALUATION WAS PERFORMED  Mills Mitton Lorie Phenix 06/08/2020, 9:24 AM

## 2020-06-08 NOTE — Progress Notes (Signed)
Occupational Therapy Session Note  Patient Details  Name: Sherry Fitzpatrick MRN: [id number] Date of Birth: [birth year]  Today's Date: 06/08/2020 OT Individual Time: 1135-1205 OT Individual Time Calculation (min): 30 min    Short Term Goals: Week 1:  OT Short Term Goal 1 (Week 1): Pt will dress LB using AE as needed with min assist OT Short Term Goal 2 (Week 1): Pt will complete toilet transfer with CGA OT Short Term Goal 3 (Week 1): Pt will complete LB bathing using AE as needed with min assist  Skilled Therapeutic Interventions/Progress Updates:    1;1. Pt received in bed reporting 8/10 pain. Pt initially declining tx d/t pain but agreeable to scooting up in bed. After S to reposition and discussing pain with pt, OT educates on music, deep diphramatic breathing, and progressive muscle relaxation. Pt participates in 10 min of alternative pain management strategies as stated above led by OT and reporting 6/10 pain after session and pain being "calmer." Pt verbalized steps of progressive muscle relaxation, belly breathing and person CD to use at home for pain management. Exited session with pt seated in bed, exit alarm on and call light in reach  Therapy Documentation Precautions:  Precautions Precautions: Fall Precaution Comments: monitor BP; ensure medicated prior to treatment Restrictions Weight Bearing Restrictions: No Other Position/Activity Restrictions: R lower abdominal hematoma causing significant pain with mobility General:   Vital Signs: Oxygen Therapy SpO2: 93 % O2 Device: Room Air Pain:   ADL: ADL Eating: Independent Where Assessed-Eating: Bed level Grooming: Setup Where Assessed-Grooming: Sitting at sink Upper Body Bathing: Minimal assistance Where Assessed-Upper Body Bathing: Sitting at sink Lower Body Bathing: Moderate assistance Where Assessed-Lower Body Bathing: Sitting at sink Upper Body Dressing: Minimal assistance Where Assessed-Upper Body Dressing: Bed  level Lower Body Dressing: Maximal assistance Where Assessed-Lower Body Dressing: Bed level Toileting: Minimal assistance Where Assessed-Toileting: Bedside Commode Toilet Transfer: Minimal assistance Toilet Transfer Method: Stand pivot Toilet Transfer Equipment: Bedside commode Vision   Perception    Praxis   Exercises:   Other Treatments:     Therapy/Group: Individual Therapy  Tonny Branch 06/08/2020, 12:07 PM

## 2020-06-08 NOTE — Progress Notes (Signed)
Physical Therapy Session Note  Patient Details  Name: Sherry Fitzpatrick MRN: [id number] Date of Birth: Dec 29, 1950  Today's Date: 06/08/2020 PT Individual Time: 1005-1030 PT Individual Time Calculation (min): 25 min   Short Term Goals: Week 1:  PT Short Term Goal 1 (Week 1): Pt will perform bed mobility with minA PT Short Term Goal 2 (Week 1): Pt will perform sit<>stand transfers with LRAD and minA PT Short Term Goal 3 (Week 1): Pt will ambulate 34ft with LRAD and minA PT Short Term Goal 4 (Week 1): Pt will go up/down x4 steps with modA  Skilled Therapeutic Interventions/Progress Updates:    Pt received supine in bed, watching TV. Pt initially refuses to participate in OOB therapies as she has had a "busy morning." Pt only agreeable to bed level there-ex. Therefore, pt performed the following AROM BLE/BUE 1x15 bed level there-ex: -heel slides  -ankle pumps -hip abduction/adduction -short-arc quads -arm raises to 130deg   Rest breaks provided b/w sets 2/2 RLQ abdominal pain > fatigue. Therapist guiding throughout for correct muscle sequencing/activation, repetitions, and dosage. Pt ended session supine in bed, needs in reach, bed alarm on, rails up.  Therapy Documentation Precautions:  Precautions Precautions: Fall Precaution Comments: monitor BP; ensure medicated prior to treatment Restrictions Weight Bearing Restrictions: No Other Position/Activity Restrictions: R lower abdominal hematoma causing significant pain with mobility  Therapy/Group: Individual Therapy  Vearl Allbaugh P Leona Pressly PT 06/08/2020, 12:13 PM

## 2020-06-08 NOTE — Progress Notes (Signed)
Patient ID: Sherry Fitzpatrick, female   DOB: 1951-01-27, 69 y.o.   MRN: [id number]   Met with pt to discuss team conference goals of supervision level and discharge date 8/27.She is very pleased with this plan. Her daughter has already gone through family education and pt as all DME from previous admits. She has no preference regarding home health-referral made to [facility] for follow up. Her main issue is her stomach pain but realizes this will take time to heal and is trying to be patient.

## 2020-06-08 NOTE — Discharge Instructions (Signed)
Inpatient Rehab Discharge Instructions  Sherry Fitzpatrick Discharge date and time: No discharge date for patient encounter.   Activities/Precautions/ Functional Status: Activity: activity as tolerated Diet: diabetic diet Wound Care: none needed Functional status:  ___ No restrictions     ___ Walk up steps independently ___ 24/7 supervision/assistance   ___ Walk up steps with assistance ___ Intermittent supervision/assistance  ___ Bathe/dress independently ___ Walk with walker     _x__ Bathe/dress with assistance ___ Walk Independently    ___ Shower independently ___ Walk with assistance    ___ Shower with assistance ___ No alcohol     ___ Return to work/school ________  Special Instructions:  No driving smoking or alcoholInpatient Rehab Discharge Instructions  COMMUNITY REFERRALS UPON DISCHARGE:    Home Health:   [facility]                                                   Erin Sons Discharge date and time: No discharge date for patient encounter.   Activities/Precautions/ Functional Status: Activity: activity as tolerated Diet: diabetic diet Wound Care: none needed Functional status:  ___ No restrictions     ___ Walk up steps independently STROKE/TIA DISCHARGE INSTRUCTIONS SMOKING Cigarette smoking nearly doubles your risk of having a stroke & is the single most alterable risk factor  If you smoke or have smoked in the last 12 months, you are advised to quit smoking for your health.  Most of the excess cardiovascular risk related to smoking disappears within a year of stopping.  Ask you doctor about anti-smoking medications  [place] Quit Line: 1-800-QUIT NOW  Free Smoking Cessation Classes [phone]  CHOLESTEROL Know your levels; limit fat & cholesterol in your diet  Lipid Panel     Component Value Date/Time   CHOL 198 05/25/2020 0107   TRIG  192 (H) 05/28/2020 0602   HDL 40 (L) 05/25/2020 0107   CHOLHDL 5.0 05/25/2020 0107   VLDL 13 05/25/2020 0107   LDLCALC 145 (H) 05/25/2020 0107      Many patients benefit from treatment even if their cholesterol is at goal.  Goal: Total Cholesterol (CHOL) less than 160  Goal:  Triglycerides (TRIG) less than 150  Goal:  HDL greater than 40  Goal:  LDL (LDLCALC) less than 100   BLOOD PRESSURE American Stroke Association blood pressure target is less that 120/80 mm/Hg  Your discharge blood pressure is:  BP: (!) 180/84  Monitor your blood pressure  Limit your salt and alcohol intake  Many individuals will require more than one medication for high blood pressure  DIABETES (A1c is a blood sugar average for last 3 months) Goal HGBA1c is under 7% (HBGA1c is blood sugar average for last 3 months)  Diabetes:     Lab Results  Component Value Date   HGBA1C 7.3 (H) 05/24/2020     Your HGBA1c can be lowered with medications, healthy diet, and exercise.  Check your blood sugar as directed by your physician  Call your physician if you experience unexplained or low blood sugars.  PHYSICAL ACTIVITY/REHABILITATION Goal is 30 minutes at least 4 days per week  Activity: Increase activity slowly, Therapies: Physical  Therapy: Home Health Return to work:   Activity decreases your risk of heart attack and stroke and makes your heart stronger.  It helps control your weight and blood pressure; helps you relax and can improve your mood.  Participate in a regular exercise program.  Talk with your doctor about the best form of exercise for you (dancing, walking, swimming, cycling).  DIET/WEIGHT Goal is to maintain a healthy weight  Your discharge diet is:  Diet Order            DIET DYS 3 Room service appropriate? Yes; Fluid consistency: Thin  Diet effective now                 liquids Your height is:  Height: 5\' 4"  (162.6 cm) Your current weight is: Weight: 116.8 kg Your Body Mass Index  (BMI) is:  BMI (Calculated): 44.18  Following the type of diet specifically designed for you will help prevent another stroke.  Your goal weight range is:    Your goal Body Mass Index (BMI) is 19-24.  Healthy food habits can help reduce 3 risk factors for stroke:  High cholesterol, hypertension, and excess weight.  RESOURCES Stroke/Support Group:  Call [phone]   STROKE EDUCATION PROVIDED/REVIEWED AND GIVEN TO PATIENT Stroke warning signs and symptoms How to activate emergency medical system (call 911). Medications prescribed at discharge. Need for follow-up after discharge. Personal risk factors for stroke. Pneumonia vaccine given:  Flu vaccine given:  My questions have been answered, the writing is legible, and I understand these instructions.  I will adhere to these goals & educational materials that have been provided to me after my discharge from the hospital.    ___ 24/7 supervision/assistance   ___ Walk up steps with assistance ___ Intermittent supervision/assistance  ___ Bathe/dress independently ___ Walk with walker     ___ Bathe/dress with assistance ___ Walk Independently    ___ Shower independently ___ Walk with assistance    ___ Shower with assistance ___ No alcohol     ___ Return to work/school ________  Special Instructions:    My questions have been answered and I understand these instructions. I will adhere to these goals and the provided educational materials after my discharge from the hospital.  Patient/Caregiver Signature _______________________________ Date __________  Clinician Signature _______________________________________ Date __________  Please bring this form and your medication list with you to all your follow-up doctor's appointments.   My questions have been answered and I understand these instructions. I will adhere to these goals and the provided educational materials after my discharge from the hospital.  Patient/Caregiver Signature  _______________________________ Date __________  Clinician Signature _______________________________________ Date __________  Please bring this form and your medication list with you to all your follow-up doctor's appointments.

## 2020-06-08 NOTE — Progress Notes (Signed)
Occupational Therapy Session Note  Patient Details  Name: Sherry Fitzpatrick MRN: [id number] Date of Birth: [birth year]  Today's Date: 06/08/2020 OT Individual Time: 3704-8889 OT Individual Time Calculation (min): 55 min    Short Term Goals: Week 1:  OT Short Term Goal 1 (Week 1): Pt will dress LB using AE as needed with min assist OT Short Term Goal 2 (Week 1): Pt will complete toilet transfer with CGA OT Short Term Goal 3 (Week 1): Pt will complete LB bathing using AE as needed with min assist  Skilled Therapeutic Interventions/Progress Updates:    1:1 Participated in bathing and dressing sitting in the recliner. Pt required encouragement to participated but wanted to bathe. PT moves slow due to discomfort in belly but able to continue. Pt able to bathe UB and dress UB with setup A. Pt able to perform all sit to stands with close supervision and dynamic standing with supervision. PT unable to reach down to feet today due to discomfort but declined the reacher today. Pt ambulated to and from the bathroom without AD with contact guard to supervision at slow speed- turning down the use of RW. PT able to perform toileting with supervision with extra time. PT had been incontinent in brief prior to toileting and donned a clean one after toileting and voiding on the toilet.  Left pt sitting up in recliner with heat on stomach with LE elevated.   Therapy Documentation Precautions:  Precautions Precautions: Fall Precaution Comments: monitor BP; ensure medicated prior to treatment Restrictions Weight Bearing Restrictions: No Other Position/Activity Restrictions: R lower abdominal hematoma causing significant pain with mobility General:   Vital Signs: Therapy Vitals Temp: 98.2 F (36.8 C) Temp Source: Oral Pulse Rate: 72 Resp: 18 BP: (!) 160/80 Patient Position (if appropriate): Sitting Oxygen Therapy SpO2: 99 % O2 Device: Room Air Pain:  ongoing stomach pain at the site of hematoma -  nursing aware. Heat applied afterwards for comfort and to help dissolve the hematoma.   Therapy/Group: Individual Therapy  Willeen Cass [facility] 06/08/2020, 3:25 PM

## 2020-06-08 NOTE — Telephone Encounter (Signed)
Pt insurance is active and benefits verified through [facility]. Co-pay $10.00, DED $0.00/$0.00 met, out of pocket $3,900.00/$2,081.44 met, co-insurance 0%. No pre-authorization required. Passport, 06/08/20 @ 12:29PM, WAQ#[id number]  Will contact patient to see if she is interested in the Cardiac Rehab Program. If interested, patient will need to complete follow up appt. Once completed, patient will be contacted for scheduling upon review by the RN Navigator.

## 2020-06-08 NOTE — Patient Care Conference (Signed)
Inpatient RehabilitationTeam Conference and Plan of Care Update Date: 06/08/2020   Time: 11:15 AM    Patient Name: Sherry Fitzpatrick      Medical Record Number: [id number]  Date of Birth: 08-26-1951 Sex: Female         Room/Bed: 4M12C/4M12C-01 Payor Info: Payor: [employer] MEDICARE / Plan: [place] HMO / Product Type: *No Product type* /    Admit Date/Time:  06/01/2020  3:54 PM  Primary Diagnosis:  Acute cerebral infarction [facility])  Hospital Problems: Principal Problem:   Acute cerebral infarction [facility]) Active Problems:   Cerebral infarction involving left cerebellar artery (HCC)   Leukocytosis   Systolic congestive heart failure (HCC)   PAF (paroxysmal atrial fibrillation) ([place])   Labile blood pressure   Acute blood loss anemia   Hypoalbuminemia due to protein-calorie malnutrition ([place])   Type 2 diabetes mellitus with hyperglycemia, with long-term current use of insulin ([place])   Benign essential HTN    Expected Discharge Date: Expected Discharge Date: 06/10/20  Team Members Present: Physician leading conference: Dr. Delice Lesch Care Coodinator Present: Dorien Chihuahua, RN, BSN, CRRN;Christina Sampson Goon, [place] Nurse Present: Other (comment) Loraine Leriche, RN) PT Present: Other (comment) Ginnie Smart, PT) OT Present: Other (comment) Clovia Cuff, OT) PPS Coordinator present : Gunnar Fusi, Novella Olive, PT     Current Status/Progress Goal Weekly Team Focus  Bowel/Bladder   Pt continent of B/B. LBM 06/07/2020  Remain continent of B/B.  Assess B/B every shift and PRN   Swallow/Nutrition/ Hydration             ADL's             Mobility   CGA/supervision  mod I / supervision  gait, endurance, pain management   Communication   Mod I  mod I - d/c from ST      Safety/Cognition/ Behavioral Observations  mod I  mod I - d/c from ST      Pain   Pt rating pain between a 5/10-8/10. Pain managed with Tylenol, Ultram and a KPAD to right lower abdominal hematoma  pain  rating <3  Assess pain every shift and PRN   Skin   Skin remains intact  Pt's skin to remain intact  Assess skin every shift and PRN     Discharge Planning:  Home with daughter who is currently working from home and 32 yo granddaughter. Pt hopes to be mod/i before going home, but wants her pain managed   Team Discussion: On target to meet goals. Progressing well even with Rt LQ abd pain.   Patient on target to meet rehab goals: yes  *See Care Plan and progress notes for long and short-term goals.   Revisions to Treatment Plan:    Teaching Needs: Family education completed 06/07/20  Current Barriers to Discharge: None  Possible Resolutions to Barriers: None     Medical Summary Current Status: Altered mental status with dysarthria secondary to bilateral cerebral/cerebellar after cardiac catheterization/PAF  Barriers to Discharge: Medical stability;Behavior   Possible Resolutions to [employer] Focus: Therapies, follow RLQ hematoma, optimize BP meds, follow weights, follow labs   Continued Need for Acute Rehabilitation Level of Care: The patient requires daily medical management by a physician with specialized training in physical medicine and rehabilitation for the following reasons: Direction of a multidisciplinary physical rehabilitation program to maximize functional independence : Yes Medical management of patient stability for increased activity during participation in an intensive rehabilitation regime.: Yes Analysis of laboratory values and/or radiology reports with  any subsequent need for medication adjustment and/or medical intervention. : Yes   I attest that I was present, lead the team conference, and concur with the assessment and plan of the team.   Dorien Chihuahua B 06/08/2020, 3:41 PM

## 2020-06-08 NOTE — Discharge Summary (Addendum)
Physician Discharge Summary  Patient ID: Sherry Fitzpatrick MRN: [id number] DOB/AGE: Nov 28, 1950 69 y.o.  Admit date: 06/01/2020 Discharge date: 06/10/2020  Discharge Diagnoses:  Principal Problem:   Acute cerebral infarction [facility]) Active Problems:   Cerebral infarction involving left cerebellar artery (HCC)   Leukocytosis   Systolic congestive heart failure (HCC)   PAF (paroxysmal atrial fibrillation) (HCC)   Labile blood pressure   Acute blood loss anemia   Hypoalbuminemia due to protein-calorie malnutrition (HCC)   Type 2 diabetes mellitus with hyperglycemia, with long-term current use of insulin (HCC)   Benign essential HTN Morbid obesity CAP Acute blood loss anemia Right groin hematoma  Discharged Condition: Stable  Significant Diagnostic Studies: EEG  Result Date: 05/28/2020 Sherry Havens, MD     05/28/2020  6:53 AM Patient Name: Sherry Fitzpatrick MRN: [id number] Epilepsy Attending: Lora Fitzpatrick Referring Physician/Provider: Dr Lesleigh Noe Date: 05/27/2020 Duration: 22.47 mins Patient history: 69 year old female with a history of stroke with no residual deficits who presented on 8/10 with NSTEMI and underwent PTCA with stenting to mid-LAD. While in the hospital today neurology was consulted for altered mental status. Exam revealed intermittent bilateral extensor posturing, right gaze deviation, weak gag and pupils mid and fixed. EEG to evaluate for status. Level of alertness: comatose AEDs during EEG study: Propofol Technical aspects: This EEG study was done with scalp electrodes positioned according to the 10-20 International system of electrode placement. Electrical activity was acquired at a sampling rate of 500Hz  and reviewed with a high frequency filter of 70Hz  and a low frequency filter of 1Hz . EEG data were recorded continuously and digitally stored. Description:  EEG showed continuous generalized 2-3 Hz delta slowing with overriding  13-15Hz  beta activity with irregular  morphology distributed symmetrically and diffusely.  Hyperventilation and photic stimulation were not performed.   ABNORMALITY -Excessive beta, generalized -Continuous slow, generalized IMPRESSION: This study is suggestive of severe diffuse encephalopathy, nonspecific etiology but likely related to sedation. No seizures or epileptiform discharges were seen throughout the recording. Sherry Fitzpatrick   CT ABDOMEN PELVIS WO CONTRAST  Result Date: 06/05/2020 CLINICAL DATA:  Pain.  Follow-up hematoma. EXAM: CT ABDOMEN AND PELVIS WITHOUT CONTRAST TECHNIQUE: Multidetector CT imaging of the abdomen and pelvis was performed following the standard protocol without IV contrast. COMPARISON:  May 27, 2020 FINDINGS: Lower chest: Consolidation in the lower lobe seen previously has largely resolved, consistent with resolving atelectasis. No acute abnormalities are seen in the lower chest. Hepatobiliary: No focal liver abnormality is seen. No gallstones, gallbladder wall thickening, or biliary dilatation. Pancreas: Unremarkable. No pancreatic ductal dilatation or surrounding inflammatory changes. Spleen: Normal in size without focal abnormality. Adrenals/Urinary Tract: A left renal cyst is again identified. The adrenal glands, kidneys, ureters, and bladder are otherwise normal. Stomach/Bowel: The stomach and small bowel are normal. Diverticulosis without diverticulitis. The appendix is unremarkable. Vascular/Lymphatic: Atherosclerosis in the nonaneurysmal aorta. No adenopathy. Reproductive: Uterus and bilateral adnexa are unremarkable. Other: No free air free fluid. Right groin hematoma extending into the right lower anterior pelvic wall persists. The hematoma in the right anterior groin measures 12 by 8 cm today versus 12 x 5.7 cm previously. The component of hematoma in the anterior right pelvic wall is similar in attenuation to surrounding muscle and difficult to measure with certainty. This portion of hematoma measures  approximately 7.6 x 3.0 cm today versus 7.8 x 3.7 cm previously. The anterior aspect of the hematoma as seen on coronal image 28 appears larger in  the interval measuring 13.6 x 7.4 cm today versus 13.2 by 5.5 cm previously. The more posterior aspect associated with the anterior musculature is more difficult to measure as it is now similar in attenuation to the adjacent musculature. However, this portion of hematoma appears smaller in the interval. The cranial caudal dimension measures 17.5 cm today versus 17.8 cm previously. Musculoskeletal: Right hip replacement. No other acute bony abnormalities. IMPRESSION: 1. The anterior aspect of the hematoma in the anterior pelvis is larger in the interval. The hematoma with in the anterior musculature is a little smaller in the interval. 2. Interval resolution of bibasilar consolidation. 3. Diverticulosis without diverticulitis. 4. Atherosclerosis in the nonaneurysmal aorta. Electronically Signed   By: Dorise Bullion III M.D   On: 06/05/2020 11:28   CT ABDOMEN PELVIS WO CONTRAST  Result Date: 05/27/2020 CLINICAL DATA:  69 year old female with hematoma in the right groin. Status post catheterization. EXAM: CT ABDOMEN AND PELVIS WITHOUT CONTRAST TECHNIQUE: Multidetector CT imaging of the abdomen and pelvis was performed following the standard protocol without IV contrast. COMPARISON:  CT abdomen pelvis dated 05/25/2020. FINDINGS: Evaluation of this exam is limited in the absence of intravenous contrast. Lower chest: There are large areas of consolidative changes with air bronchograms involving the lower lobes with overall associated volume loss. This is new since the prior CT and favored to represent atelectasis, although pneumonia is not excluded. Clinical correlation is recommended. Calcification of the aortic annulus noted. No intra-abdominal free air or free fluid. Hepatobiliary: Fatty liver. No intrahepatic biliary ductal dilatation. The gallbladder is unremarkable.  Pancreas: Unremarkable. No pancreatic ductal dilatation or surrounding inflammatory changes. Spleen: The spleen is small otherwise unremarkable. Adrenals/Urinary Tract: Mild left adrenal nodularity. The right adrenal glands are unremarkable. There is a 15 mm left renal interpolar cyst. There is no hydronephrosis or nephrolithiasis on either side. The visualized ureters and urinary bladder appear unremarkable. Stomach/Bowel: An enteric tube is noted with tip in the distal stomach. There is sigmoid diverticulosis without active inflammatory changes. No bowel obstruction or active inflammation. The appendix is normal. Vascular/Lymphatic: Mild aortoiliac atherosclerotic disease. The IVC is unremarkable. No portal venous gas. There is no adenopathy. Reproductive: The uterus is anteverted. Small calcified fibroids noted. No adnexal masses. Other: Right groin hematoma extending into the right lower anterior pelvic wall. The hematoma in the right groin measures approximately 6 x 12 cm (71/4) and the component of the hematoma in the anterior right pelvic wall measures approximately 4 x 8 cm (62/4). The overall craniocaudal length of the hematoma measures approximately 18 cm. Overall the size of the hematoma is similar to the prior CT when accounting for difference in measurement technique and distribution of the hematoma between the 2 studies. Correlation with clinical exam and H/H levels and continued follow-up as clinically indicated recommended. A compression device noted over the right groin. Musculoskeletal: Right hip arthroplasty. Degenerative changes of the spine. No acute osseous pathology. IMPRESSION: 1. Large right groin hematoma relatively similar to the prior CT. Correlation with H/H levels and continued follow-up as clinically indicated. 2. No hydronephrosis or nephrolithiasis. 3. Sigmoid diverticulosis. No bowel obstruction. Normal appendix. 4. Aortic Atherosclerosis (ICD10-I70.0). Electronically Signed   By:  Anner Crete M.D.   On: 05/27/2020 18:57   CT ABDOMEN PELVIS WO CONTRAST  Result Date: 05/25/2020 CLINICAL DATA:  Unspecified abdominal pain, status post catheterization, right groin pain EXAM: CT ABDOMEN AND PELVIS WITHOUT CONTRAST TECHNIQUE: Multidetector CT imaging of the abdomen and pelvis was performed following the standard  protocol without IV contrast. COMPARISON:  None. FINDINGS: Lower chest: Mild ground-glass pulmonary infiltrate seen within the visualized lung bases bilaterally is nonspecific but may represent trace pulmonary edema. Cardiac size within normal limits. No pericardial effusion. Hepatobiliary: Layering hyperdensity within the gallbladder lumen may represent vicarious excretion of contrast or layering hyperdense sludge or stones. No superimposed pericholecystic inflammatory change. The liver is unremarkable. No intra or extrahepatic biliary ductal dilation. Pancreas: Unremarkable Spleen: Unremarkable Adrenals/Urinary Tract: Adrenal glands are unremarkable. Kidneys are normal in size and position. Simple cortical cyst noted within the a interpolar region of the left kidney. No hydronephrosis. No intrarenal or ureteral calculi. The bladder is largely obscured by streak artifact from right total hip arthroplasty, however, hyperdense material within the bladder lumen likely represents excreted contrast given the patient's history of recent catheterization. Stomach/Bowel: Stomach is within normal limits. Appendix appears normal. No evidence of bowel wall thickening, distention, or inflammatory changes. Vascular/Lymphatic: A hematoma extends from the right common femoral vascular bundle coursing within the anterior abdominal wall superolaterally between the external and internal oblique muscles. This hematoma measures roughly 18.9 x 7.0 x 5.6 cm in greatest dimension.s there is thickening of the external oblique and internal oblique muscles likely real obtained to intramuscular hemorrhage or  edema. There is no extension of the hematoma into the retroperitoneum or below the transversus abdominus musculature into the peritoneal cavity itself. There is infiltration involving the right lower quadrant abdominal wall and extending into the right inguinal crease in keeping with subcutaneous hemorrhage or edema. Mild scattered aortoiliac atherosclerotic plaque. No aneurysm. No pathologic abdominal or pelvic adenopathy. Reproductive: The pelvic organs are unremarkable. No adnexal masses. Other: Rectum unremarkable. Musculoskeletal: Right total hip arthroplasty has been performed. Degenerative changes are seen within the lumbar spine. No acute bone abnormality. IMPRESSION: 1. Large hematoma extending from the right common femoral vascular bundle coursing within the anterior abdominal wall superolaterally between the external and internal oblique muscles. This hematoma measures roughly 18.9 x 7.0 x 5.6 cm. There is no extension of the hematoma into the retroperitoneum or below the transversus abdominus musculature into the peritoneal cavity itself. 2. Mild ground-glass pulmonary infiltrate within the visualized lung bases bilaterally is nonspecific but may represent trace pulmonary edema. 3. Layering hyperdensity within the gallbladder lumen may represent vicarious excretion of contrast or layering hyperdense sludge or stones. No superimposed pericholecystic inflammatory change. Aortic Atherosclerosis (ICD10-I70.0). Electronically Signed   By: Fidela Salisbury MD   On: 05/25/2020 23:16   DG Chest 2 View  Result Date: 05/24/2020 CLINICAL DATA:  Chest pain.  Hypoxia. EXAM: CHEST - 2 VIEW COMPARISON:  Two-view chest x-ray 03/27/2020 FINDINGS: Cardiomegaly is stable. Patchy interstitial and airspace opacities are present bilaterally. No significant consolidation is present. Axial skeleton is unremarkable. IMPRESSION: 1. Stable cardiomegaly. 2. Patchy interstitial and airspace opacities bilaterally. Differential  diagnosis includes edema versus infection. Electronically Signed   By: San Morelle M.D.   On: 05/24/2020 04:48   DG Abd 1 View  Result Date: 06/04/2020 CLINICAL DATA:  Right-sided abdominal pain EXAM: ABDOMEN - 1 VIEW COMPARISON:  None. FINDINGS: The bowel gas pattern is normal. No radio-opaque calculi or other significant radiographic abnormality are seen. IMPRESSION: Negative. Electronically Signed   By: Dorise Bullion III M.D   On: 06/04/2020 15:18   CT HEAD WO CONTRAST  Result Date: 05/27/2020 CLINICAL DATA:  69 year old female with altered mental status. EXAM: CT HEAD WITHOUT CONTRAST TECHNIQUE: Contiguous axial images were obtained from the base of the skull through  the vertex without intravenous contrast. COMPARISON:  Head CT dated 03/27/2020. FINDINGS: Brain: The ventricles and sulci appropriate size for patient's age. Mild periventricular and deep white matter chronic microvascular ischemic changes noted. There is no acute intracranial hemorrhage. No mass effect or midline shift no extra-axial fluid collection. Vascular: No hyperdense vessel or unexpected calcification. Skull: Normal. Negative for fracture or focal lesion. Sinuses/Orbits: There is partial opacification of the right sphenoid sinus. The remainder of the visualized paranasal sinuses and mastoid air cells are clear. Other: An endotracheal and an enteric tube are partially visualized. IMPRESSION: 1. No acute intracranial pathology. 2. Mild chronic microvascular ischemic changes. Electronically Signed   By: Anner Crete M.D.   On: 05/27/2020 18:39   MR ANGIO HEAD WO CONTRAST  Result Date: 05/28/2020 CLINICAL DATA:  Initial evaluation for acute stroke. EXAM: MRI HEAD WITHOUT CONTRAST MRA HEAD WITHOUT CONTRAST MRA NECK WITHOUT CONTRAST TECHNIQUE: Multiplanar, multiecho pulse sequences of the brain and surrounding structures were obtained without intravenous contrast. Angiographic images of the Circle of Willis were  obtained using MRA technique without intravenous contrast. Angiographic images of the neck were obtained using MRA technique without intravenous contrast. Carotid stenosis measurements (when applicable) are obtained utilizing NASCET criteria, using the distal internal carotid diameter as the denominator. COMPARISON:  Prior head CT from 05/27/2020. FINDINGS: MRI HEAD FINDINGS Brain: Cerebral volume within normal limits for age. Patchy T2/FLAIR hyperintensity seen within the periventricular and deep white matter of both cerebral hemispheres, most consistent with chronic small vessel ischemic disease, moderate in nature. Few scattered superimposed remote lacunar infarcts noted about the basal ganglia and hemispheric cerebral white matter. 12 mm area of restricted diffusion seen involving the right posterior splenium (series 6, image 76). Few additional punctate acute ischemic infarcts seen within the right cerebral white matter (series 6, images 78, 76). Probable additional small ischemic infarct involving the right hippocampus (series 6, image 71). Punctate left cerebellar infarct noted as well (series 6, image 61). No associated hemorrhage or mass effect. Gray-white matter differentiation otherwise maintained. No encephalomalacia to suggest chronic cortical infarction elsewhere within the brain. No evidence for acute or chronic intracranial hemorrhage. No mass lesion, midline shift or mass effect. No hydrocephalus or extra-axial fluid collection. Pituitary gland suprasellar region normal. Midline structures intact. Vascular: Major intracranial vascular flow voids are maintained. Skull and upper cervical spine: Craniocervical junction within normal limits. Bone marrow signal intensity normal. No scalp soft tissue abnormality. Sinuses/Orbits: Globes and orbital soft tissues within normal limits. Moderate layering fluid noted within the dominant right sphenoid sinus. Layering fluid noted within the nasopharynx as well.  Small bilateral mastoid effusions. Patient is intubated. Other: None. MRA HEAD FINDINGS ANTERIOR CIRCULATION: Examination somewhat degraded by motion artifact. Visualized distal cervical segments of the internal carotid arteries are both patent with antegrade flow. Petrous segments widely patent. Scattered atheromatous change throughout the carotid siphons with associated moderate to severe multifocal stenoses, most pronounced at the supraclinoid right ICA (series 7, image 94). Origin of the ophthalmic arteries not well seen on this exam. ICA termini perfused. Severe bilateral proximal A1 stenoses noted (series 1044, image 11). Normal anterior communicating artery complex. Moderate atheromatous irregularity throughout the ACAs which remain patent to their distal aspects. No M1 stenosis or occlusion. Grossly normal MCA bifurcations. Severe proximal left M2 stenosis, inferior division (series 1044, image 10). No proximal MCA branch occlusion. Additional moderate to advance atheromatous irregularity throughout the MCA branches bilaterally. POSTERIOR CIRCULATION: Vertebral arteries widely patent as they course into the cranial  vault. Right vertebral widely patent to the takeoff of the right PICA. Right PICA patent. Distally, there is moderate to severe narrowing of the distal right V4 segment (series 7, image 39). On the left, the left PICA takes off early and appears to be grossly patent. Short-segment severe proximal left V4 stenosis noted (series 7, image 13). Left vertebral irregular distally but remains patent to the vertebrobasilar junction. Atheromatous irregularity throughout the basilar artery without high-grade stenosis. Left SCA widely patent. Severe stenosis at the origin of the right SCA (series 1056, image 12). Both PCAs primarily supplied via the basilar. Multifocal moderate to severe bilateral P2 stenoses noted. PCAs remain patent to their distal aspects. No intracranial aneurysm. MRA NECK FINDINGS  AORTIC ARCH: Examination technically limited by lack of IV contrast. Visualized aortic arch within normal limits for caliber. Bovine branching pattern with common origin of the left common and brachiocephalic arteries noted. No hemodynamically significant stenosis seen about the origin of the great vessels. RIGHT CAROTID SYSTEM: Right CCA patent to the bifurcation without stenosis. Atheromatous irregularity at the origin of the right ICA with mild stenosis of approximately 30-40% by NASCET criteria. Right ICA mildly tortuous but patent distally to the skull base without stenosis, dissection, or occlusion. LEFT CAROTID SYSTEM: Left CCA patent from its origin to the bifurcation without stenosis. Atheromatous change at the origin of the left ICA with estimated 40% stenosis by NASCET criteria. Left ICA mildly tortuous but otherwise patent distally to the skull base without stenosis, dissection or occlusion. VERTEBRAL ARTERIES: Both vertebral arteries appear to arise from the subclavian arteries. Origins of the vertebral arteries not well assessed on this examination. Visualized vertebral arteries widely patent with symmetric antegrade flow, with no evidence for dissection or occlusion. IMPRESSION: MRI HEAD IMPRESSION: 1. Patchy small volume acute ischemic infarcts involving the right splenium and right cerebral white matter as above, with additional punctate acute ischemic left cerebellar infarct. No associated hemorrhage or mass effect. 2. Underlying age-related cerebral atrophy with moderate chronic microvascular ischemic disease. MRA HEAD IMPRESSION: 1. Motion degraded exam. 2. Negative MRA for large vessel occlusion. 3. Extensive intracranial atherosclerotic disease with multifocal moderate to severe stenoses involving both the anterior and posterior circulation as detailed above. MRA NECK IMPRESSION: 1. Approximate 30-40% stenoses about the origins of both internal carotid arteries, left slightly worse than right.  No other hemodynamically significant stenosis or other abnormality about either carotid artery system within the neck. 2. Wide patency of both vertebral arteries within the neck. Electronically Signed   By: Jeannine Boga M.D.   On: 05/28/2020 05:48   MR ANGIO NECK WO CONTRAST  Result Date: 05/28/2020 CLINICAL DATA:  Initial evaluation for acute stroke. EXAM: MRI HEAD WITHOUT CONTRAST MRA HEAD WITHOUT CONTRAST MRA NECK WITHOUT CONTRAST TECHNIQUE: Multiplanar, multiecho pulse sequences of the brain and surrounding structures were obtained without intravenous contrast. Angiographic images of the Circle of Willis were obtained using MRA technique without intravenous contrast. Angiographic images of the neck were obtained using MRA technique without intravenous contrast. Carotid stenosis measurements (when applicable) are obtained utilizing NASCET criteria, using the distal internal carotid diameter as the denominator. COMPARISON:  Prior head CT from 05/27/2020. FINDINGS: MRI HEAD FINDINGS Brain: Cerebral volume within normal limits for age. Patchy T2/FLAIR hyperintensity seen within the periventricular and deep white matter of both cerebral hemispheres, most consistent with chronic small vessel ischemic disease, moderate in nature. Few scattered superimposed remote lacunar infarcts noted about the basal ganglia and hemispheric cerebral white matter. 12 mm  area of restricted diffusion seen involving the right posterior splenium (series 6, image 76). Few additional punctate acute ischemic infarcts seen within the right cerebral white matter (series 6, images 78, 76). Probable additional small ischemic infarct involving the right hippocampus (series 6, image 71). Punctate left cerebellar infarct noted as well (series 6, image 61). No associated hemorrhage or mass effect. Gray-white matter differentiation otherwise maintained. No encephalomalacia to suggest chronic cortical infarction elsewhere within the brain.  No evidence for acute or chronic intracranial hemorrhage. No mass lesion, midline shift or mass effect. No hydrocephalus or extra-axial fluid collection. Pituitary gland suprasellar region normal. Midline structures intact. Vascular: Major intracranial vascular flow voids are maintained. Skull and upper cervical spine: Craniocervical junction within normal limits. Bone marrow signal intensity normal. No scalp soft tissue abnormality. Sinuses/Orbits: Globes and orbital soft tissues within normal limits. Moderate layering fluid noted within the dominant right sphenoid sinus. Layering fluid noted within the nasopharynx as well. Small bilateral mastoid effusions. Patient is intubated. Other: None. MRA HEAD FINDINGS ANTERIOR CIRCULATION: Examination somewhat degraded by motion artifact. Visualized distal cervical segments of the internal carotid arteries are both patent with antegrade flow. Petrous segments widely patent. Scattered atheromatous change throughout the carotid siphons with associated moderate to severe multifocal stenoses, most pronounced at the supraclinoid right ICA (series 7, image 94). Origin of the ophthalmic arteries not well seen on this exam. ICA termini perfused. Severe bilateral proximal A1 stenoses noted (series 1044, image 11). Normal anterior communicating artery complex. Moderate atheromatous irregularity throughout the ACAs which remain patent to their distal aspects. No M1 stenosis or occlusion. Grossly normal MCA bifurcations. Severe proximal left M2 stenosis, inferior division (series 1044, image 10). No proximal MCA branch occlusion. Additional moderate to advance atheromatous irregularity throughout the MCA branches bilaterally. POSTERIOR CIRCULATION: Vertebral arteries widely patent as they course into the cranial vault. Right vertebral widely patent to the takeoff of the right PICA. Right PICA patent. Distally, there is moderate to severe narrowing of the distal right V4 segment  (series 7, image 39). On the left, the left PICA takes off early and appears to be grossly patent. Short-segment severe proximal left V4 stenosis noted (series 7, image 13). Left vertebral irregular distally but remains patent to the vertebrobasilar junction. Atheromatous irregularity throughout the basilar artery without high-grade stenosis. Left SCA widely patent. Severe stenosis at the origin of the right SCA (series 1056, image 12). Both PCAs primarily supplied via the basilar. Multifocal moderate to severe bilateral P2 stenoses noted. PCAs remain patent to their distal aspects. No intracranial aneurysm. MRA NECK FINDINGS AORTIC ARCH: Examination technically limited by lack of IV contrast. Visualized aortic arch within normal limits for caliber. Bovine branching pattern with common origin of the left common and brachiocephalic arteries noted. No hemodynamically significant stenosis seen about the origin of the great vessels. RIGHT CAROTID SYSTEM: Right CCA patent to the bifurcation without stenosis. Atheromatous irregularity at the origin of the right ICA with mild stenosis of approximately 30-40% by NASCET criteria. Right ICA mildly tortuous but patent distally to the skull base without stenosis, dissection, or occlusion. LEFT CAROTID SYSTEM: Left CCA patent from its origin to the bifurcation without stenosis. Atheromatous change at the origin of the left ICA with estimated 40% stenosis by NASCET criteria. Left ICA mildly tortuous but otherwise patent distally to the skull base without stenosis, dissection or occlusion. VERTEBRAL ARTERIES: Both vertebral arteries appear to arise from the subclavian arteries. Origins of the vertebral arteries not well assessed on this  examination. Visualized vertebral arteries widely patent with symmetric antegrade flow, with no evidence for dissection or occlusion. IMPRESSION: MRI HEAD IMPRESSION: 1. Patchy small volume acute ischemic infarcts involving the right splenium and  right cerebral white matter as above, with additional punctate acute ischemic left cerebellar infarct. No associated hemorrhage or mass effect. 2. Underlying age-related cerebral atrophy with moderate chronic microvascular ischemic disease. MRA HEAD IMPRESSION: 1. Motion degraded exam. 2. Negative MRA for large vessel occlusion. 3. Extensive intracranial atherosclerotic disease with multifocal moderate to severe stenoses involving both the anterior and posterior circulation as detailed above. MRA NECK IMPRESSION: 1. Approximate 30-40% stenoses about the origins of both internal carotid arteries, left slightly worse than right. No other hemodynamically significant stenosis or other abnormality about either carotid artery system within the neck. 2. Wide patency of both vertebral arteries within the neck. Electronically Signed   By: Jeannine Boga M.D.   On: 05/28/2020 05:48   MR BRAIN WO CONTRAST  Result Date: 05/28/2020 CLINICAL DATA:  Initial evaluation for acute stroke. EXAM: MRI HEAD WITHOUT CONTRAST MRA HEAD WITHOUT CONTRAST MRA NECK WITHOUT CONTRAST TECHNIQUE: Multiplanar, multiecho pulse sequences of the brain and surrounding structures were obtained without intravenous contrast. Angiographic images of the Circle of Willis were obtained using MRA technique without intravenous contrast. Angiographic images of the neck were obtained using MRA technique without intravenous contrast. Carotid stenosis measurements (when applicable) are obtained utilizing NASCET criteria, using the distal internal carotid diameter as the denominator. COMPARISON:  Prior head CT from 05/27/2020. FINDINGS: MRI HEAD FINDINGS Brain: Cerebral volume within normal limits for age. Patchy T2/FLAIR hyperintensity seen within the periventricular and deep white matter of both cerebral hemispheres, most consistent with chronic small vessel ischemic disease, moderate in nature. Few scattered superimposed remote lacunar infarcts noted  about the basal ganglia and hemispheric cerebral white matter. 12 mm area of restricted diffusion seen involving the right posterior splenium (series 6, image 76). Few additional punctate acute ischemic infarcts seen within the right cerebral white matter (series 6, images 78, 76). Probable additional small ischemic infarct involving the right hippocampus (series 6, image 71). Punctate left cerebellar infarct noted as well (series 6, image 61). No associated hemorrhage or mass effect. Gray-white matter differentiation otherwise maintained. No encephalomalacia to suggest chronic cortical infarction elsewhere within the brain. No evidence for acute or chronic intracranial hemorrhage. No mass lesion, midline shift or mass effect. No hydrocephalus or extra-axial fluid collection. Pituitary gland suprasellar region normal. Midline structures intact. Vascular: Major intracranial vascular flow voids are maintained. Skull and upper cervical spine: Craniocervical junction within normal limits. Bone marrow signal intensity normal. No scalp soft tissue abnormality. Sinuses/Orbits: Globes and orbital soft tissues within normal limits. Moderate layering fluid noted within the dominant right sphenoid sinus. Layering fluid noted within the nasopharynx as well. Small bilateral mastoid effusions. Patient is intubated. Other: None. MRA HEAD FINDINGS ANTERIOR CIRCULATION: Examination somewhat degraded by motion artifact. Visualized distal cervical segments of the internal carotid arteries are both patent with antegrade flow. Petrous segments widely patent. Scattered atheromatous change throughout the carotid siphons with associated moderate to severe multifocal stenoses, most pronounced at the supraclinoid right ICA (series 7, image 94). Origin of the ophthalmic arteries not well seen on this exam. ICA termini perfused. Severe bilateral proximal A1 stenoses noted (series 1044, image 11). Normal anterior communicating artery complex.  Moderate atheromatous irregularity throughout the ACAs which remain patent to their distal aspects. No M1 stenosis or occlusion. Grossly normal MCA bifurcations. Severe proximal left  M2 stenosis, inferior division (series 1044, image 10). No proximal MCA branch occlusion. Additional moderate to advance atheromatous irregularity throughout the MCA branches bilaterally. POSTERIOR CIRCULATION: Vertebral arteries widely patent as they course into the cranial vault. Right vertebral widely patent to the takeoff of the right PICA. Right PICA patent. Distally, there is moderate to severe narrowing of the distal right V4 segment (series 7, image 39). On the left, the left PICA takes off early and appears to be grossly patent. Short-segment severe proximal left V4 stenosis noted (series 7, image 13). Left vertebral irregular distally but remains patent to the vertebrobasilar junction. Atheromatous irregularity throughout the basilar artery without high-grade stenosis. Left SCA widely patent. Severe stenosis at the origin of the right SCA (series 1056, image 12). Both PCAs primarily supplied via the basilar. Multifocal moderate to severe bilateral P2 stenoses noted. PCAs remain patent to their distal aspects. No intracranial aneurysm. MRA NECK FINDINGS AORTIC ARCH: Examination technically limited by lack of IV contrast. Visualized aortic arch within normal limits for caliber. Bovine branching pattern with common origin of the left common and brachiocephalic arteries noted. No hemodynamically significant stenosis seen about the origin of the great vessels. RIGHT CAROTID SYSTEM: Right CCA patent to the bifurcation without stenosis. Atheromatous irregularity at the origin of the right ICA with mild stenosis of approximately 30-40% by NASCET criteria. Right ICA mildly tortuous but patent distally to the skull base without stenosis, dissection, or occlusion. LEFT CAROTID SYSTEM: Left CCA patent from its origin to the bifurcation  without stenosis. Atheromatous change at the origin of the left ICA with estimated 40% stenosis by NASCET criteria. Left ICA mildly tortuous but otherwise patent distally to the skull base without stenosis, dissection or occlusion. VERTEBRAL ARTERIES: Both vertebral arteries appear to arise from the subclavian arteries. Origins of the vertebral arteries not well assessed on this examination. Visualized vertebral arteries widely patent with symmetric antegrade flow, with no evidence for dissection or occlusion. IMPRESSION: MRI HEAD IMPRESSION: 1. Patchy small volume acute ischemic infarcts involving the right splenium and right cerebral white matter as above, with additional punctate acute ischemic left cerebellar infarct. No associated hemorrhage or mass effect. 2. Underlying age-related cerebral atrophy with moderate chronic microvascular ischemic disease. MRA HEAD IMPRESSION: 1. Motion degraded exam. 2. Negative MRA for large vessel occlusion. 3. Extensive intracranial atherosclerotic disease with multifocal moderate to severe stenoses involving both the anterior and posterior circulation as detailed above. MRA NECK IMPRESSION: 1. Approximate 30-40% stenoses about the origins of both internal carotid arteries, left slightly worse than right. No other hemodynamically significant stenosis or other abnormality about either carotid artery system within the neck. 2. Wide patency of both vertebral arteries within the neck. Electronically Signed   By: Jeannine Boga M.D.   On: 05/28/2020 05:48   CARDIAC CATHETERIZATION  Result Date: 05/24/2020  Mid LAD lesion is 99% stenosed.  A drug-eluting stent was successfully placed using a STENT RESOLUTE ONYX 3.5X22, postdilated to 4.0 mm, optimized with IVUS.  Post intervention, there is a 0% residual stenosis.  Continue DAPT for 12 months along with aggressive secondary prevention.  Will give a dose of IV Lasix post procedure given her elevated LVEDP.   CARDIAC  CATHETERIZATION  Result Date: 05/24/2020  Prox RCA to Mid RCA lesion is 60% stenosed.  Mid LAD lesion is 95% stenosed.  1st Diag lesion is 70% stenosed.  Ost LAD lesion is 20% stenosed.  The left ventricular systolic function is normal.  LV end diastolic pressure is  moderately elevated.  The left ventricular ejection fraction is 50-55% by visual estimate.    Portable Chest xray  Result Date: 05/28/2020 CLINICAL DATA:  Status post endotracheal tube placement EXAM: PORTABLE CHEST 1 VIEW COMPARISON:  05/28/2019 FINDINGS: Cardiac shadow is enlarged but stable. Endotracheal tube, gastric catheter and left jugular central line are noted. The overall inspiratory effort is again poor with bibasilar atelectatic changes identified. Some perihilar opacities are noted as well likely representing atelectasis. IMPRESSION: No significant change from the prior study. Electronically Signed   By: Inez Catalina M.D.   On: 05/28/2020 06:34   DG CHEST PORT 1 VIEW  Result Date: 05/27/2020 CLINICAL DATA:  Intubation EXAM: PORTABLE CHEST 1 VIEW COMPARISON:  05/24/2020 chest radiograph and prior. FINDINGS: Endotracheal tube is approximately 2.1 cm above the carina. Enteric tube tip and side hole overlie the gastric body. Left IJ CVC tip overlies the superior cavoatrial junction. Left predominant perihilar and bibasilar patchy opacities. No pneumothorax or pleural effusion. Cardiomegaly. Coronary stent. IMPRESSION: ETT tip approximately 2.1 cm above the carina. Appropriately positioned enteric tube. Left IJ CVC tip overlies the superior cavoatrial junction. Perihilar and bibasilar opacities, unchanged.  Cardiomegaly. Electronically Signed   By: Primitivo Gauze M.D.   On: 05/27/2020 16:54   DG Chest Port 1V same Day  Result Date: 05/30/2020 CLINICAL DATA:  Status post extubation.  Shortness of breath. EXAM: PORTABLE CHEST 1 VIEW COMPARISON:  May 28, 2020 FINDINGS: Endotracheal tube and nasogastric tube have been  removed. Central catheter tip is in the superior vena cava. No pneumothorax. There is mild atelectatic change in the bases and right mid lung regions, stable. No new opacity evident. Heart is prominent, stable, with pulmonary vascularity normal. No adenopathy. There is aortic atherosclerosis. No bone lesions. IMPRESSION: No pneumothorax. Stable cardiac prominence. Areas of atelectatic change, stable. No new opacity. Aortic Atherosclerosis (ICD10-I70.0). Electronically Signed   By: Lowella Grip III M.D.   On: 05/30/2020 07:57   VAS [place] GROIN PSEUDOANEURYSM  Result Date: 05/28/2020  ARTERIAL PSEUDOANEURYSM  Exam: Right groin Indications: Patient complains of groin pain. History: S/p catheterization. Performing Technologist: Abram Sander RVS  Examination Guidelines: A complete evaluation includes B-mode imaging, spectral Doppler, color Doppler, and power Doppler as needed of all accessible portions of each vessel. Bilateral testing is considered an integral part of a complete examination. Limited examinations for reoccurring indications may be performed as noted. +------------+----------+---------+------+----------+ Right DuplexPSV (cm/s)Waveform PlaqueComment(s) +------------+----------+---------+------+----------+ CFA            116    triphasic                 +------------+----------+---------+------+----------+  Summary: No evidence of pseudoaneurysm, AVF or DVT  Diagnosing physician: Ruta Hinds MD Electronically signed by Ruta Hinds MD on 05/28/2020 at 12:13:57 PM.   --------------------------------------------------------------------------------    Final     Labs:  Basic Metabolic Panel: Recent Labs  Lab 06/06/20 0421 06/09/20 0729  NA 138 139  K 4.2 4.1  CL 103 102  CO2 27 27  GLUCOSE 110* 170*  BUN 14 12  CREATININE 0.91 1.09*  CALCIUM 8.4* 8.7*    CBC: Recent Labs  Lab 06/05/20 0457 06/06/20 0421  WBC 14.8* 14.4*  HGB 8.4* 8.6*  HCT 26.6* 27.7*  MCV 91.7  92.6  PLT 368 406*    CBG: Recent Labs  Lab 06/08/20 2109 06/09/20 0629 06/09/20 1226 06/09/20 1655 06/09/20 2059  GLUCAP 135* 123* 83 138* 143*   Family history.  Mother with heart  failure and hypertension.  Father with diabetes.  Sister with hypertension.  Denies any colon cancer esophageal cancer rectal cancer  Brief HPI:   HADLYN AMERO is a 69 y.o. right-handed female with history of hypertension, systolic congestive heart failure, TIA without residual weakness, diabetes mellitus, hyperlipidemia, morbid obesity with BMI 44.24, CAD with stenting maintained on aspirin and Plavix.  Patient lives with her children.  Reportedly independent prior to admission.  1 level home 2 steps to entry.  Daughter does most of the housework and heavy meal preparation.  Presented 05/24/2020 with chest pain and dyspnea radiating to left shoulder with very little relief with nitroglycerin.  EKG normal sinus rhythm with ST-T wave changes inferior lateral leads noted to have minimally elevated high-sensitivity troponin.  Started on IV heparin and nitro.  Most recent echocardiogram with ejection fraction 65%.  She underwent cardiac catheterization with stenting per cardiology services.  Hospital course complicated by hypotension.  On the morning of 05/27/2020 episode of emesis severe lower abdominal pain right groin pain relieved with tramadol.  Later a day episode of acute altered mental status dysarthria question seizure as well as ongoing hypotension requiring dopamine.  MRI showed patchy small volume acute ischemic infarct in the right splenium and right cerebral white matter with additional punctate acute ischemic left cerebellar infarction.  No associated hemorrhage.  MRA negative of the head.  MRA of the neck approximately 30 to 40% stenosis about the origins of both internal carotid arteries left slightly worse than right.  EEG negative for seizure.  Vascular ultrasound of groin showed no evidence of  pseudoaneurysm or DVT.  Patient currently maintained on aspirin Plavix for CVA prophylaxis.  Placed on Rocephin 816 2021 x 2 days for CAP.  Tolerating a soft diet.  Patient was admitted for a comprehensive rehab program.   Hospital Course: Sherry Fitzpatrick was admitted to rehab 06/01/2020 for inpatient therapies to consist of PT, ST and OT at least three hours five days a week. Past admission physiatrist, therapy team and rehab RN have worked together to provide customized collaborative inpatient rehab.  Pertaining to patient's bilateral cerebral cerebellar infarct after cardiac catheterization remained stable she continues on aspirin and Plavix therapy.  She would follow-up with neurology as well as cardiology services.  Patient did develop a right groin hematoma after cardiac catheterization conservative care continued on aspirin and Plavix with warm compresses.  Blood pressure controlled with amlodipine Imdur increased to 60 mg daily.  Blood sugars monitored hemoglobin A1c of 7.3.  Full diabetic teaching.  She exhibited no other signs of fluid overload.  Morbid obesity BMI 44.24 dietary follow-up.  Lipitor ongoing for hyperlipidemia.  She completed course of Rocephin for CAP remaining afebrile.  Overall cardiac rate controlled amiodarone as advised again followed by cardiology services.   Blood pressures were monitored on TID basis and controlled  Diabetes has been monitored with ac/hs CBG checks and SSI was use prn for tighter BS control.    Rehab course: During patient's stay in rehab weekly team conferences were held to monitor patient's progress, set goals and discuss barriers to discharge. At admission, patient required 2 person hand-held assist for equipment use moderate assist sit to stand moderate assist stand pivot transfers.  Minimal assist upper body bathing max is lower body bathing total assist lower body dressing moderate assist toilet transfers  Physical exam.  Blood pressure 181/71  pulse 73 temperature 98.5 respirations 18 oxygen saturation 95% room air Constitutional.  Alert and oriented Head.  Normocephalic and atraumatic Eyes.  Pupils round and reactive to light no discharge.nystagmus Neck.  Supple nontender no JVD without thyromegaly Cardiac.  Rate controlled Abdomen.  Soft nontender.  Right groin hematoma tender to palpation Respiratory effort normal no respiratory distress without wheeze Neurological alert and oriented motor strength 4+/5 throughout  /She  has had improvement in activity tolerance, balance, postural control as well as ability to compensate for deficits. Marlana Salvage has had improvement in functional use RUE/LUE  and RLE/LLE as well as improvement in awareness.  Working with energy conservation techniques.  Performed transfers throughout sessions with supervision without assistive device.  Ambulates 150 feet rolling walker.  Negotiated stairs with supervision.  She gathers her own belongings for activities delivered and homemaking.  Full family teaching completed plan discharged home       Disposition: Discharged to home    Diet: Regular  Special Instructions: No driving smoking or alcohol  Medications at discharge 1.  Tylenol as needed 2.  Amiodarone 100 mg p.o. daily 3.  Norvasc 10 mg p.o. daily 4.  Norvasc 10 mg p.o. daily 5.  Aspirin 81 mg p.o. daily 6.  Lipitor 80 mg p.o. daily 7.  Plavix 75 mg p.o. daily 8.  Colace 100 mg p.o. twice daily 9.  Ferrous sulfate 325 mg p.o. 3 times daily 10.  Imdur 60 mg p.o. nightly 11.  Dulera 2 puffs twice daily 12.  Nitroglycerin as needed 13.  Protonix 40 mg p.o. nightly 14.  Ultram 50 mg every 6 hours as needed moderate pain 15.  Albuterol inhaler 2 puffs every 6 hours as needed 16.  Glucophage 500 mg p.o. daily 17.lasix 40 mg daily 18 KDUR 20 meq daily 19.  Symbicort 2 puffs twice daily 20.  Coreg 6.25 mg twice daily 21.  Valsartan 160 mg daily   30-35 minutes were spent completing  discharge summary and discharge planning Discharge Instructions     Ambulatory referral to Neurology   Complete by: As directed    An appointment is requested in approximately 4 weeks left cerebellar infarction   Ambulatory referral to Physical Medicine Rehab   Complete by: As directed    Moderate complexity follow-up 1 to 2 weeks left cerebellar infarction        Follow-up Information     Jamse Arn, MD Follow up.   Specialty: Physical Medicine and Rehabilitation Why: Office to call for appointment Contact information: [address] [place] [address] [phone]         Charolette Forward, MD Follow up.   Specialty: Cardiology Why: Call for appointment Contact information: [address] [place] [address] [phone]                 Signed: Cathlyn Parsons 06/10/2020, 5:13 AM Patient was seen, face-face, and physical exam performed by me on day of discharge, greater than 30 minutes of total time spent.. Please see progress note from day of discharge as well.  Delice Lesch, MD, ABPMR

## 2020-06-08 NOTE — Progress Notes (Signed)
Physical Therapy Session Note  Patient Details  Name: Sherry Fitzpatrick MRN: [id number] Date of Birth: 1951-05-17  Today's Date: 06/08/2020 PT Individual Time: 0803-0915 PT Individual Time Calculation (min): 72 min   Short Term Goals: Week 1:  PT Short Term Goal 1 (Week 1): Pt will perform bed mobility with minA PT Short Term Goal 2 (Week 1): Pt will perform sit<>stand transfers with LRAD and minA PT Short Term Goal 3 (Week 1): Pt will ambulate 99f with LRAD and minA PT Short Term Goal 4 (Week 1): Pt will go up/down x4 steps with modA  Skilled Therapeutic Interventions/Progress Updates: Pt presented sitting EOB with nsg present agreeable to therapy. Pt c/o pain in LUQ 9-10/10 with pt having received pain meds prior to therapy. Per pt had already gone to bathroom but was agreeable to change clothes. Pt performed STS from EOB with RA and PTA assisted with donning new brief. Pt then sat at recliner and donned pants with minA and top with set up. Pt required intermittent rest breaks due to increased pain in LUQ of abdomen. During rest break pt's dgt called with questions regarding how long for anticipated supervision upon d/c. PTA explained unable to determine exact timeline as pending progress once d/c as well as pain management. Dgt verbalized understanding. PTA then obtained rollator and pt ambulated to and from rehab gym with x 1 going to gym and x 2 standing rest breaks upon returning to room. Pt was primarily limited by pain with all activity this session and voice concern and frustration regarding pain with PTA providing emotional support. Upon returning to room pt requesting to return to bed and performed sit to supine with supervision and increased time. PTA placed K-pad on pt and pt left resting with bed alarm on, call bell within reach and needs met.      Therapy Documentation Precautions:  Precautions Precautions: Fall Precaution Comments: monitor BP; ensure medicated prior to  treatment Restrictions Weight Bearing Restrictions: No Other Position/Activity Restrictions: R lower abdominal hematoma causing significant pain with mobility General:   Vital Signs: Therapy Vitals Temp: 98.2 F (36.8 C) Temp Source: Oral Pulse Rate: 72 Resp: 18 BP: (!) 160/80 Patient Position (if appropriate): Sitting Oxygen Therapy SpO2: 99 % O2 Device: Room Air    Therapy/Group: Individual Therapy  Merrick Maggio  Jewel Mcafee, PTA  06/08/2020, 4:04 PM

## 2020-06-09 ENCOUNTER — Inpatient Hospital Stay (HOSPITAL_COMMUNITY): Payer: Medicare HMO

## 2020-06-09 ENCOUNTER — Inpatient Hospital Stay (HOSPITAL_COMMUNITY): Payer: Medicare HMO | Admitting: Occupational Therapy

## 2020-06-09 ENCOUNTER — Inpatient Hospital Stay (HOSPITAL_COMMUNITY): Payer: Medicare HMO | Admitting: Speech Pathology

## 2020-06-09 ENCOUNTER — Inpatient Hospital Stay (HOSPITAL_COMMUNITY): Payer: Medicare HMO | Admitting: *Deleted

## 2020-06-09 LAB — BASIC METABOLIC PANEL
Anion gap: 10 (ref 5–15)
BUN: 12 mg/dL (ref 8–23)
CO2: 27 mmol/L (ref 22–32)
Calcium: 8.7 mg/dL — ABNORMAL LOW (ref 8.9–10.3)
Chloride: 102 mmol/L (ref 98–111)
Creatinine, Ser: 1.09 mg/dL — ABNORMAL HIGH (ref 0.44–1.00)
GFR calc Af Amer: 60 mL/min — ABNORMAL LOW (ref >60)
GFR calc non Af Amer: 51 mL/min — ABNORMAL LOW (ref >60)
Glucose, Bld: 170 mg/dL — ABNORMAL HIGH (ref 70–99)
Potassium: 4.1 mmol/L (ref 3.5–5.1)
Sodium: 139 mmol/L (ref 135–145)

## 2020-06-09 LAB — GLUCOSE, CAPILLARY
Glucose-Capillary: 123 mg/dL — ABNORMAL HIGH (ref 70–99)
Glucose-Capillary: 83 mg/dL (ref 70–99)
Glucose-Capillary: 138 mg/dL — ABNORMAL HIGH (ref 70–99)
Glucose-Capillary: 143 mg/dL — ABNORMAL HIGH (ref 70–99)

## 2020-06-09 MED ORDER — VALSARTAN 160 MG PO TABS: 160.0000 mg | ORAL_TABLET | Freq: Every day | ORAL | 3 refills | Status: DC

## 2020-06-09 MED ORDER — CLOPIDOGREL BISULFATE 75 MG PO TABS: 75.0000 mg | ORAL_TABLET | Freq: Every day | ORAL | 3 refills | Status: DC

## 2020-06-09 MED ORDER — DOCUSATE SODIUM 100 MG PO CAPS: 100.0000 mg | ORAL_CAPSULE | Freq: Two times a day (BID) | ORAL | 0 refills | Status: DC

## 2020-06-09 MED ORDER — POTASSIUM CHLORIDE ER 10 MEQ PO TBCR: 20.0000 meq | EXTENDED_RELEASE_TABLET | Freq: Every day | ORAL | 1 refills | Status: DC

## 2020-06-09 MED ORDER — ACETAMINOPHEN 325 MG PO TABS: 650.0000 mg | ORAL_TABLET | Freq: Four times a day (QID) | ORAL | Status: AC | PRN

## 2020-06-09 MED ORDER — AMIODARONE HCL 100 MG PO TABS: 100.0000 mg | ORAL_TABLET | Freq: Every day | ORAL | 0 refills | Status: DC

## 2020-06-09 MED ORDER — FERROUS SULFATE 325 (65 FE) MG PO TABS: 325.0000 mg | ORAL_TABLET | Freq: Three times a day (TID) | ORAL | 3 refills | Status: DC

## 2020-06-09 MED ORDER — ATORVASTATIN CALCIUM 80 MG PO TABS: 80.0000 mg | ORAL_TABLET | Freq: Every day | ORAL | 3 refills | Status: DC

## 2020-06-09 MED ORDER — BUDESONIDE-FORMOTEROL FUMARATE 160-4.5 MCG/ACT IN AERO: 2.0000 | INHALATION_SPRAY | Freq: Two times a day (BID) | RESPIRATORY_TRACT | 0 refills | Status: DC

## 2020-06-09 MED ORDER — FUROSEMIDE 40 MG PO TABS: 40.0000 mg | ORAL_TABLET | Freq: Every day | ORAL | 0 refills | Status: DC

## 2020-06-09 MED ORDER — AMLODIPINE BESYLATE 10 MG PO TABS: 10.0000 mg | ORAL_TABLET | Freq: Every day | ORAL | 0 refills | Status: DC

## 2020-06-09 MED ORDER — ISOSORBIDE MONONITRATE ER 60 MG PO TB24: 60.0000 mg | ORAL_TABLET | Freq: Every day | ORAL | 0 refills | Status: DC

## 2020-06-09 MED ORDER — METFORMIN HCL 500 MG PO TABS: 500.0000 mg | ORAL_TABLET | Freq: Every day | ORAL | 1 refills | Status: DC

## 2020-06-09 MED ORDER — ALBUTEROL SULFATE HFA 108 (90 BASE) MCG/ACT IN AERS: 2.0000 | INHALATION_SPRAY | Freq: Four times a day (QID) | RESPIRATORY_TRACT | 0 refills | Status: DC | PRN

## 2020-06-09 MED ORDER — TRAMADOL HCL 50 MG PO TABS: 50.0000 mg | ORAL_TABLET | Freq: Four times a day (QID) | ORAL | 0 refills | Status: DC | PRN

## 2020-06-09 MED ORDER — PANTOPRAZOLE SODIUM 40 MG PO TBEC: 40.0000 mg | DELAYED_RELEASE_TABLET | Freq: Every day | ORAL | 3 refills | Status: DC

## 2020-06-09 MED ORDER — POLYETHYLENE GLYCOL 3350 17 G PO PACK: 17.0000 g | PACK | Freq: Every day | ORAL | 0 refills | Status: DC

## 2020-06-09 MED ORDER — CARVEDILOL 6.25 MG PO TABS: 6.2500 mg | ORAL_TABLET | Freq: Two times a day (BID) | ORAL | 0 refills | Status: DC

## 2020-06-09 NOTE — Discharge Summary (Signed)
Physical Therapy Discharge Summary  Patient Details  Name: Sherry Fitzpatrick MRN: [id number] Date of Birth: 1951-07-26  Today's Date: 06/09/2020 PT Individual Time: 9675-9163 PT Individual Time Calculation (min): 53 min    Patient has met 10 of 10 long term goals due to improved activity tolerance, improved balance, increased strength, decreased pain, ability to compensate for deficits, improved awareness and improved coordination.  Patient to discharge at an ambulatory level Supervision.   Patient's care partner is independent to provide the necessary physical and cognitive assistance at discharge.  Reasons goals not met: N/A  Recommendation:  Patient will benefit from ongoing skilled PT services in home health setting to continue to advance safe functional mobility, address ongoing impairments in balance, strength, endurance, and gait in order to minimize fall risk.  Equipment: No equipment provided . Pt has needed DME at home.  Reasons for discharge: treatment goals met  Patient/family agrees with progress made and goals achieved: Yes  PT Discharge Precautions/Restrictions Precautions Precautions: Fall Restrictions Weight Bearing Restrictions: No Other Position/Activity Restrictions: pain in right lower abdomin requires pt increased time for functional mobility Vital Signs Therapy Vitals Temp: 98.4 F (36.9 C) Temp Source: Oral Pulse Rate: 76 Resp: 16 BP: (!) 152/57 Patient Position (if appropriate): Lying Oxygen Therapy SpO2: 95 % O2 Device: Room Air Pain Pain Assessment Pain Scale: 0-10 Pain Score: 5  Pain Type: Acute pain Pain Location: Abdomen Pain Orientation: Right Pain Descriptors / Indicators: Sore Pain Onset: On-going Patients Stated Pain Goal: 2 Pain Intervention(s): Medication (See eMAR) Multiple Pain Sites: No Vision/Perception  Vision - Assessment Eye Alignment: Within Functional Limits Ocular Range of Motion: Within Functional  Limits Alignment/Gaze Preference: Within Defined Limits Tracking/Visual Pursuits: Able to track stimulus in all quads without difficulty Perception Perception: Within Functional Limits Praxis Praxis: Intact  Cognition Overall Cognitive Status: Within Functional Limits for tasks assessed Arousal/Alertness: Awake/alert Orientation Level: Oriented X4 Attention: Alternating Focused Attention: Appears intact Sustained Attention: Appears intact Selective Attention: Appears intact Alternating Attention: Appears intact Alternating Attention Impairment: Verbal complex;Functional complex Memory: Appears intact Memory Impairment: Decreased recall of new information Awareness: Appears intact Problem Solving: Appears intact Organizing: Appears intact Self Monitoring: Appears intact Safety/Judgment: Appears intact Sensation Sensation Light Touch: Appears Intact Hot/Cold: Appears Intact Proprioception: Appears Intact Stereognosis: Appears Intact Coordination Gross Motor Movements are Fluid and Coordinated: No Fine Motor Movements are Fluid and Coordinated: Yes Motor  Motor Motor: Within Functional Limits Motor - Skilled Clinical Observations: Delayed/effortful movement due to pain in right abdomen  Mobility Bed Mobility Bed Mobility: Rolling Right;Rolling Left;Supine to Sit Rolling Right: Independent Rolling Left: Independent Supine to Sit: Independent Sit to Supine: Independent Transfers Transfers: Sit to Stand;Stand to Sit;Stand Pivot Transfers Sit to Stand: Independent with assistive device Stand to Sit: Independent with assistive device Stand Pivot Transfers: Independent with assistive device Stand Pivot Transfer Details: Verbal cues for technique Transfer (Assistive device): Rollator Locomotion  Gait Ambulation: Yes Gait Assistance: Supervision/Verbal cueing Gait Distance (Feet): 150 Feet Assistive device: Rollator Gait Gait: Yes Gait Pattern: Impaired Gait Pattern:  Decreased hip/knee flexion - right;Decreased hip/knee flexion - left;Decreased step length - right;Decreased step length - left;Decreased stance time - right Gait velocity: decreased Stairs / Additional Locomotion Stairs: Yes Stairs Assistance: Supervision/Verbal cueing Stair Management Technique: Two rails Number of Stairs: 12 Height of Stairs: 6 Ramp: Supervision/Verbal cueing Wheelchair Mobility Wheelchair Mobility: No  Trunk/Postural Assessment  Cervical Assessment Cervical Assessment: Within Functional Limits Thoracic Assessment Thoracic Assessment: Exceptions to WFL (rounded shldrs) Lumbar Assessment Lumbar  Assessment: Within Functional Limits  Balance Balance Balance Assessed: Yes Static Sitting Balance Static Sitting - Level of Assistance: 7: Independent Dynamic Sitting Balance Dynamic Sitting - Level of Assistance: 7: Independent Static Standing Balance Static Standing - Level of Assistance: 6: Modified independent (Device/Increase time) Dynamic Standing Balance Dynamic Standing - Level of Assistance: 5: Stand by assistance Extremity Assessment  RUE Assessment RUE Assessment: Within Functional Limits LUE Assessment LUE Assessment: Within Functional Limits RLE Assessment RLE Assessment: Within Functional Limits General Strength Comments: Pain limiting volitional movement, grossly 4-/5    Skilled Intervention: Pt received semi-reclined in bed, agreeable to PT session (with effort). Pt c/o 7/10 RLQ pain, NT notified at end of session. HOB lowered, pt performed rolling L<>R indep although effortful and ++ time. Supine<>sit indep with HOB flat and no rails, again effortful and pain limiting. Donned pants and socks with maxA for time management purposes. Sit<>stand mod I with rollator. Pt ambulated 157f with supervision and rollator to stairs where she required a seated rest break before she negotiated up/down x8 steps with supervision and B HR support, cues for sequencing  and technique (ascent with L foot leading, descent with R foot leading). Pt then transported to car simulator where she performed car transfer with supervision, appropriate technique, increased difficulty raising RLE into car due to pain. Pt then ambulated up/down x120framp with supervision and rollator, gait speed grossly decreased and continued to be pain limiting. Transported pt back to her room where she was able to use reacher in standing to pick up laundry with supervision and no AD. Pt requesting toilet use, ambulated to toilet with supervision and no AD where she ended session on the toilet. Instructed her to use pull cord when finished for nursing to assist back to bed, pt verbalized understanding and NT notified after session.   Ishaan Villamar P Alcides Nutting PT 06/09/2020, 3:22 PM

## 2020-06-09 NOTE — Evaluation (Signed)
Recreational Therapy Assessment and Plan  Patient Details  Name: Sherry Fitzpatrick MRN: [id number] Date of Birth: [birth year] Today's Date: 06/09/2020  Rehab Potential:  Good ELOS: d/c 06/10/2020  Assessment  Hospital Problem: Principal Problem:   Acute cerebral infarction [facility]) Active Problems:   Cerebral infarction involving left cerebellar artery (HCC)   Leukocytosis   Systolic congestive heart failure (HCC)   PAF (paroxysmal atrial fibrillation) ([place])   Past Medical History:      Past Medical History:  Diagnosis Date  . Alterations of sensations, late effect of cerebrovascular disease(438.6)   . Arthritis    "all over"  . Backache, unspecified   . Body mass index 40.0-44.9, adult ([place])   . Carpal tunnel syndrome   . Dizziness and giddiness   . Esophageal reflux   . Generalized pain   . Headache    "had alot til I had pituitary tumor removed"  . Heart murmur   . Other abnormal glucose   . Other and unspecified hyperlipidemia   . Other malaise and fatigue   . Other postprocedural status(V45.89)   . Other symptoms involving cardiovascular system   . Sleep apnea    "did test; they wanted me to get a CPAP but I never did get it" (04/14/2015) Does use inhaler at night (01 2021)  . Stroke ([place]) 06/2003; 02/2010   "minor; minor" ; denies residual on 04/14/2015  . Unspecified disorder of the pituitary gland and its hypothalamic control   . Unspecified essential hypertension   . Unspecified visual disturbance    Past Surgical History:       Past Surgical History:  Procedure Laterality Date  . CARPAL TUNNEL RELEASE Left ~ 2014  . CORONARY STENT INTERVENTION N/A 05/24/2020   Procedure: CORONARY STENT INTERVENTION;  Surgeon: Jettie Booze, MD;  Location: [place] [facility];  Service: Cardiovascular;  Laterality: N/A;  . JOINT REPLACEMENT    . LEFT HEART CATH AND CORONARY ANGIOGRAPHY N/A 05/24/2020   Procedure: LEFT HEART CATH AND CORONARY  ANGIOGRAPHY;  Surgeon: Charolette Forward, MD;  Location: [place] [facility];  Service: Cardiovascular;  Laterality: N/A;  . TOTAL HIP ARTHROPLASTY Right 11/06/2010  . TRANSPHENOIDAL / TRANSNASAL HYPOPHYSECTOMY / RESECTION PITUITARY TUMOR  06/13/2012  . TUBAL LIGATION  1974    Assessment & Plan Clinical Impression: Patient is a 69 year old right-handed female with history of hypertension, systolic congestive heart failure, TIA without residual weakness, diabetes mellitus, hyperlipidemia, morbid obesity with BMI of 44.24, CAD with stenting maintained on aspirin and Plavix. History taken from chart review and patient. Patient lives with her children. Reportedly independent prior to admission. 1 level home 2 steps to entry. Daughter does most of the housework and heavy meal preparation. She presented on 05/24/2020 with chest pain and dyspnea, radiating to the left shoulder and very little relief with nitroglycerin. EKG normal sinus rhythm with ST-T wave changes in inferior lateral leads noted to have minimally elevated high-sensitivity troponin. Started on IV heparin and nitro. Most recent echocardiogram with ejection fraction of 65%. She underwent cardiac catheterization with stenting per cardiology services. Hospital course further complicated by hypotension. On the morning of 05/27/2020 episode of emesis severe lower abdominal pain and right groin pain relieved with tramadol. Later in the day episode of acute altered mental status dysarthria question seizure as well as ongoing hypotension requiring dopamine. MRI showed patchy small volume acute ischemic infarct in the right splenium and right cerebral white matter with additional punctate acute ischemic left cerebellar infarct. No  associated hemorrhage. MRA negative of the head. MRA of the neck approximately 30 to 40% stenosis about the origins of both internal carotid arteries left slightly worse than right. EEG negative for seizures. Vascular  ultrasound of groin showed no evidence of pseudoaneurysm or DVT. Patient currently remained on aspirin Plavix for CVA prophylaxis. She was placed on Rocephin for CAP 8/16/ 2021x2 doses. Tolerating a mechanical soft diet with thin liquids. Therapy evaluations completed and patient was admitted for a comprehensive rehab program. Patient transferred to CIR on 06/01/2020.   Met with pt today to discuss importance of leisure and its impact on overall health & wellness with pt and daughter.  Discussed activity analysis identifying potential modifications.  Also discussed relaxation training and reviewed, demonstrated deep breathing technique that had been introduced by OT in previous session.  Pt and daughter state confusion over discharge plan as charge nurse was just in saying that discharge date was not set in stone and could modified.  Performed chart review to confirm that discharge date was set for tomorrow and then confirmed this with Linna Hoff, [place].  Both stated appreciation for the clarification.  Plan  No further TR, education provided today  Recommendations for other services: None   Discharge Criteria: Patient will be discharged from TR if patient refuses treatment 3 consecutive times without medical reason.  If treatment goals not met, if there is a change in medical status, if patient makes no progress towards goals or if patient is discharged from hospital.  The above assessment, treatment plan, treatment alternatives and goals were discussed and mutually agreed upon: by patient  [place] 06/09/2020, 1:09 PM

## 2020-06-09 NOTE — Plan of Care (Signed)
  Problem: Consults Goal: RH GENERAL PATIENT EDUCATION Description: See Patient Education module for education specifics. Outcome: Progressing   Problem: RH BOWEL ELIMINATION Goal: RH STG MANAGE BOWEL WITH ASSISTANCE Description: STG Manage Bowel with min Assistance. Outcome: Progressing Goal: RH STG MANAGE BOWEL W/MEDICATION W/ASSISTANCE Description: STG Manage Bowel with Medication with min Assistance. Outcome: Progressing   Problem: RH BLADDER ELIMINATION Goal: RH STG MANAGE BLADDER WITH ASSISTANCE Description: STG Manage Bladder With min Assistance Outcome: Progressing

## 2020-06-09 NOTE — Progress Notes (Signed)
Occupational Therapy Discharge Summary  Patient Details  Name: Sherry Fitzpatrick MRN: [id number] Date of Birth: 1951/03/06  Today's Date: 06/09/2020 OT Individual Time: 2951-8841 OT Individual Time Calculation (min): 68 min    Patient has met 8 of 8 long term goals due to improved activity tolerance, improved balance, ability to compensate for deficits and improved awareness.  Patient to discharge at overall Modified Independent level.  Patient's care partner is independent to provide the necessary physical and cognitive assistance at discharge.     Recommendation:  Patient will benefit from ongoing skilled OT services in home health setting to continue to advance functional skills in the area of BADL and iADL.  Equipment: No equipment provided  Reasons for discharge: treatment goals met and discharge from hospital  Patient/family agrees with progress made and goals achieved: Yes   OT Skilled Intervention:  Pt supine in bed, needing encouragement to participate initially with skilled OT.  Pt requesting to toilet and then requesting to bathe at sink.  Pt completed all self care per below levels of assist.  Educated pt regarding safe functional mobility with care that floor is dry before ambulating.  Pt reports most of home is carpet except for kitchen and bathroom, and reports good understanding.  Pt requesting to return to bed after sinkside self care.  Pt ambulated from sink to EOB with supervision and sit to supine with independence.  Call bell in reach, bed alarm on.  Heating pad applied to abdomen for pain reduction.  OT Discharge Precautions/Restrictions  Precautions Precautions: Fall Restrictions Weight Bearing Restrictions: No Other Position/Activity Restrictions: pain in right lower abdomin requires pt increased time for functional mobility  Pain Pain Assessment Pain Scale: 0-10 Pain Score: 8  Pain Type: Acute pain Pain Location: Abdomen Pain Orientation: Right Pain  Descriptors / Indicators: Sore Pain Onset: On-going Patients Stated Pain Goal: 0 Pain Intervention(s): Repositioned;Rest Multiple Pain Sites: No ADL ADL Equipment Provided: Reacher Eating: Independent Where Assessed-Eating: Chair Grooming: Independent Where Assessed-Grooming: Sitting at sink Upper Body Bathing: Independent Where Assessed-Upper Body Bathing: Sitting at sink Lower Body Bathing: Modified independent Where Assessed-Lower Body Bathing: Sitting at sink, Standing at sink Upper Body Dressing: Modified independent (Device) Where Assessed-Upper Body Dressing: Sitting at sink Lower Body Dressing: Modified independent Where Assessed-Lower Body Dressing: Sitting at sink, Standing at sink Toileting: Supervision/safety Where Assessed-Toileting: Bedside Commode Toilet Transfer: Close supervision Toilet Transfer Method: Counselling psychologist: Bedside commode Vision Baseline Vision/History: Wears glasses Wears Glasses: Reading only Patient Visual Report: No change from baseline Vision Assessment?: No apparent visual deficits Eye Alignment: Within Functional Limits Ocular Range of Motion: Within Functional Limits Alignment/Gaze Preference: Within Defined Limits Tracking/Visual Pursuits: Able to track stimulus in all quads without difficulty Perception  Perception: Within Functional Limits Praxis Praxis: Intact Cognition Overall Cognitive Status: Within Functional Limits for tasks assessed Arousal/Alertness: Awake/alert Orientation Level: Oriented X4 Focused Attention: Appears intact Sustained Attention: Appears intact Selective Attention: Appears intact Alternating Attention: Appears intact Memory: Appears intact Awareness: Appears intact Problem Solving: Appears intact Organizing: Appears intact Self Monitoring: Appears intact Safety/Judgment: Appears intact Sensation Sensation Light Touch: Appears Intact Hot/Cold: Appears Intact Proprioception:  Appears Intact Stereognosis: Appears Intact Coordination Gross Motor Movements are Fluid and Coordinated: No Fine Motor Movements are Fluid and Coordinated: Yes Motor  Motor Motor: Within Functional Limits Motor - Skilled Clinical Observations: Delayed/effortful movement due to pain in right abdomen Mobility  Bed Mobility Bed Mobility: Rolling Right;Rolling Left;Supine to Sit Rolling Right: Independent Rolling Left: Independent Supine  to Sit: Independent Sit to Supine: Independent Transfers Sit to Stand: Independent with assistive device Stand to Sit: Independent with assistive device  Trunk/Postural Assessment  Cervical Assessment Cervical Assessment: Exceptions to [facility] (slight forward head posture) Thoracic Assessment Thoracic Assessment: Exceptions to [facility] (rounded shoulders) Lumbar Assessment Lumbar Assessment: Exceptions to [facility] (mild posterior pelvic tilt)  Balance Balance Balance Assessed: Yes Static Sitting Balance Static Sitting - Level of Assistance: 7: Independent Dynamic Sitting Balance Dynamic Sitting - Level of Assistance: 7: Independent Static Standing Balance Static Standing - Level of Assistance: 6: Modified independent (Device/Increase time) Dynamic Standing Balance Dynamic Standing - Level of Assistance: 5: Stand by assistance Extremity/Trunk Assessment RUE Assessment RUE Assessment: Within Functional Limits LUE Assessment LUE Assessment: Within Functional Limits   Sherry Fitzpatrick 06/09/2020, 12:43 PM

## 2020-06-09 NOTE — Progress Notes (Signed)
Patient ID: Sherry Fitzpatrick, female   DOB: 1951/03/12, 69 y.o.   MRN: [id number] Follow up with the patient to review educational materials given earlier and review any questions regarding secondary stroke prevention information. Patient noted she did not have questions at the moment and had all of the information to review with her family at discharge. Margarito Liner

## 2020-06-09 NOTE — Progress Notes (Addendum)
[facility] PHYSICAL MEDICINE & REHABILITATION PROGRESS NOTE  Subjective/Complaints: No complaints this morning. Brushing teeth. RLQ pain is stable Daughter at bedside has no concerns  ROS: + Abdominal pain. Denies CP, SOB, N/V/D  Objective: Vital Signs: Blood pressure (!) 158/65, pulse 72, temperature 98 F (36.7 C), resp. rate 20, height 5\' 4"  (1.626 m), weight 116.4 kg, SpO2 98 %. No results found. No results for input(s): WBC, HGB, HCT, PLT in the last 72 hours. Recent Labs    06/09/20 0729  NA 139  K 4.1  CL 102  CO2 27  GLUCOSE 170*  BUN 12  CREATININE 1.09*  CALCIUM 8.7*    Physical Exam: BP (!) 158/65 (BP Location: Left Arm)   Pulse 72   Temp 98 F (36.7 C)   Resp 20   Ht 5\' 4"  (1.626 m)   Wt 116.4 kg   SpO2 98%   BMI 44.05 kg/m  General: Alert and oriented x 3, No apparent distress HEENT: Head is normocephalic, atraumatic, PERRLA, EOMI, sclera anicteric, oral mucosa pink and moist, dentition intact, ext ear canals clear,  Neck: Supple without JVD or lymphadenopathy Heart: Reg rate and rhythm. No murmurs rubs or gallops Chest: CTA bilaterally without wheezes, rales, or rhonchi; no distress GI: Right lower quadrant with tenderness and firmness..  BS slowed. Skin: Warm and dry.  Intact. Psych: Normal mood.  Normal behavior. Musc: No edema in extremities.  No tenderness in extremities. Neuro: Alert Dysarthria, stable Motor: 4+/5 with pain inhibition in LE's   Assessment/Plan: 1. Functional deficits secondary to bilateral cerebral/cerebellar infarcts after cardiac cath which require 3+ hours per day of interdisciplinary therapy in a comprehensive inpatient rehab setting.  Physiatrist is providing close team supervision and 24 hour management of active medical problems listed below.  Physiatrist and rehab team continue to assess barriers to discharge/monitor patient progress toward functional and medical goals  Care Tool:  Bathing    Body parts  bathed by patient: Right arm, Left arm, Chest, Abdomen, Face, Front perineal area, Buttocks, Right upper leg, Left upper leg   Body parts bathed by helper: Right lower leg, Left lower leg     Bathing assist Assist Level: Contact Guard/Touching assist     Upper Body Dressing/Undressing Upper body dressing   What is the patient wearing?: Pull over shirt    Upper body assist Assist Level: Set up assist    Lower Body Dressing/Undressing Lower body dressing      What is the patient wearing?: Incontinence brief, Pants     Lower body assist Assist for lower body dressing: Contact Guard/Touching assist     Toileting Toileting    Toileting assist Assist for toileting: Supervision/Verbal cueing     Transfers Chair/bed transfer  Transfers assist     Chair/bed transfer assist level: Supervision/Verbal cueing     Locomotion Ambulation   Ambulation assist      Assist level: Supervision/Verbal cueing Assistive device: Walker-rolling Max distance: 119ft   Walk 10 feet activity   Assist     Assist level: Supervision/Verbal cueing Assistive device: Walker-rolling   Walk 50 feet activity   Assist Walk 50 feet with 2 turns activity did not occur: Safety/medical concerns (fatigued after 45ft)  Assist level: Supervision/Verbal cueing Assistive device: Walker-rolling    Walk 150 feet activity   Assist Walk 150 feet activity did not occur: Safety/medical concerns  Assist level: Supervision/Verbal cueing Assistive device: Walker-rolling    Walk 10 feet on uneven surface  activity  Assist Walk 10 feet on uneven surfaces activity did not occur: Safety/medical concerns   Assist level: Contact Guard/Touching assist Assistive device: Other (comment) (no AD)   Wheelchair     Assist Will patient use wheelchair at discharge?: No             Wheelchair 50 feet with 2 turns activity    Assist            Wheelchair 150 feet activity      Assist           BP (!) 158/65 (BP Location: Left Arm)   Pulse 72   Temp 98 F (36.7 C)   Resp 20   Ht 5\' 4"  (1.626 m)   Wt 116.4 kg   SpO2 98%   BMI 44.05 kg/m   Medical Problem List and Plan: 1.  Altered mental status with dysarthria secondary to bilateral cerebral/cerebellar after cardiac catheterization/PAF  Continue CIR 2.  Antithrombotics: -DVT/anticoagulation: SCDs             -antiplatelet therapy: Aspirin 81 mg daily and Plavix 75 mg daily 3. Pain Management: Tylenol as needed.   RLQ hematoma pain stable on 8/26 4. Mood: Provide emotional support             -antipsychotic agents: N/A 5. Neuropsych: This patient is capable of making decisions on her own behalf. 6. Skin/Wound Care: Routine skin checks 7. Fluids/Electrolytes/Nutrition: Routine in and outs. 8. HTN:  Continue amlodipine 10mg   Increase Imdur to 60mg .    Very labile on 8/25, changed Imdur to at bedtime on 8/25  8/26: Labile- continue to monitor.  9.  Diabetes mellitus with hyperglycemia.  Hemoglobin A1c 7.3.  NovoLog 4 units every 4 hours.  Check blood sugars as directed.  8/26 well controlled 10.  Systolic congestive heart failure. Imdur 30 mg daily.  Monitor for any signs of fluid overload  Daily weights ordered Filed Weights   06/05/20 0500 06/06/20 0500 06/09/20 0500  Weight: 115.2 kg 117.1 kg 116.4 kg   Relatively stable on 8/23, no repeat weights 11.  Morbid obesity.  BMI 44.24.  Dietary follow-up.  Encourage weight loss 12.  Hyperlipidemia.  Lipitor 13.  CAP.  IV Rocephin completed.  Check oxygen saturations every shift 14.  Post stroke dysphagia:   Now on regular thins  Advance diet as tolerated 15.  Leukocytosis  WBCs 14.6 on 8/19  Afebrile 16. Atrial fibrillation  Continue amiodarone 100 mg daily.  17.  Acute blood loss anemia  Hemoglobin 8.6 on 8/23  Continue to monitor 18.  Hypokalemia  Potassium 4.2 on 8/23, 4.1 on 8/26.  Supplemented increased on 8/20 19.   Hypoalbuminemia  Supplement initiated on 8/20 20. Right groin hematoma first noted after cardiac cath.   -Appreciate cardiology's eval. Continue ASA/Plavix given recent stent.    LOS: 8 days A FACE TO FACE EVALUATION WAS PERFORMED  Clide Deutscher [place] Mckesson 06/09/2020, 9:45 AM

## 2020-06-10 DIAGNOSIS — R109 Unspecified abdominal pain: Secondary | ICD-10-CM

## 2020-06-10 DIAGNOSIS — R1031 Right lower quadrant pain: Secondary | ICD-10-CM

## 2020-06-10 LAB — GLUCOSE, CAPILLARY: Glucose-Capillary: 129 mg/dL — ABNORMAL HIGH (ref 70–99)

## 2020-06-10 NOTE — Progress Notes (Signed)
Pt discharge done by PA. Pt belongings packed by family and staff. Pt assisted out to vehicle by [employer]

## 2020-06-10 NOTE — Progress Notes (Signed)
[facility] PHYSICAL MEDICINE & REHABILITATION PROGRESS NOTE  Subjective/Complaints: Patient seen laying in bed this morning.  She states she slept well overnight.  She states she is ready for discharge.  Daughter at bedside.  Daughter patient with questions regarding follow-up appointments, medications, hematoma.  ROS: + Abdominal pain with some improvement. Denies CP, SOB, N/V/D  Objective: Vital Signs: Blood pressure (!) 147/63, pulse 73, temperature 98.3 F (36.8 C), resp. rate 18, height 5\' 4"  (1.626 m), weight 114.4 kg, SpO2 94 %. No results found. No results for input(s): WBC, HGB, HCT, PLT in the last 72 hours. Recent Labs    06/09/20 0729  NA 139  K 4.1  CL 102  CO2 27  GLUCOSE 170*  BUN 12  CREATININE 1.09*  CALCIUM 8.7*    Physical Exam: BP (!) 147/63 (BP Location: Left Arm)    Pulse 73    Temp 98.3 F (36.8 C)    Resp 18    Ht 5\' 4"  (1.626 m)    Wt 114.4 kg    SpO2 94%    BMI 43.29 kg/m  Constitutional: No distress . Vital signs reviewed. HENT: Normocephalic.  Atraumatic. Eyes: EOMI. No discharge. Cardiovascular: No JVD. Respiratory: Normal effort.  No stridor. GI: Tenderness in right lower quadrant, firm. Skin: Warm and dry.  Intact. Psych: Normal mood.  Normal behavior. Musc: No edema in extremities.  No tenderness in extremities. Neuro: Alert Dysarthria, unchanged Motor: 4+/5 with pain inhibition in LE's   Assessment/Plan: 1. Functional deficits secondary to bilateral cerebral/cerebellar infarcts after cardiac cath which require 3+ hours per day of interdisciplinary therapy in a comprehensive inpatient rehab setting.  Physiatrist is providing close team supervision and 24 hour management of active medical problems listed below.  Physiatrist and rehab team continue to assess barriers to discharge/monitor patient progress toward functional and medical goals  Care Tool:  Bathing    Body parts bathed by patient: Right arm, Left arm, Chest, Abdomen,  Face, Front perineal area, Buttocks, Right upper leg, Left upper leg, Left lower leg, Right lower leg   Body parts bathed by helper: Right lower leg, Left lower leg     Bathing assist Assist Level: Supervision/Verbal cueing     Upper Body Dressing/Undressing Upper body dressing   What is the patient wearing?: Pull over shirt    Upper body assist Assist Level: Independent with assistive device    Lower Body Dressing/Undressing Lower body dressing      What is the patient wearing?: Incontinence brief, Pants     Lower body assist Assist for lower body dressing: Independent with assitive device     Toileting Toileting    Toileting assist Assist for toileting: Supervision/Verbal cueing     Transfers Chair/bed transfer  Transfers assist     Chair/bed transfer assist level: Independent with assistive device Chair/bed transfer assistive device: Museum/gallery exhibitions officer assist      Assist level: Supervision/Verbal cueing Assistive device: Rollator Max distance: 156ft   Walk 10 feet activity   Assist     Assist level: Supervision/Verbal cueing Assistive device: Rollator   Walk 50 feet activity   Assist Walk 50 feet with 2 turns activity did not occur: Safety/medical concerns (fatigued after 74ft)  Assist level: Supervision/Verbal cueing Assistive device: Rollator    Walk 150 feet activity   Assist Walk 150 feet activity did not occur: Safety/medical concerns  Assist level: Supervision/Verbal cueing Assistive device: Rollator    Walk 10 feet on  uneven surface  activity   Assist Walk 10 feet on uneven surfaces activity did not occur: Safety/medical concerns   Assist level: Supervision/Verbal cueing Assistive device: Rollator   Wheelchair     Assist Will patient use wheelchair at discharge?: No             Wheelchair 50 feet with 2 turns activity    Assist            Wheelchair 150 feet activity      Assist           BP (!) 147/63 (BP Location: Left Arm)    Pulse 73    Temp 98.3 F (36.8 C)    Resp 18    Ht 5\' 4"  (1.626 m)    Wt 114.4 kg    SpO2 94%    BMI 43.29 kg/m   Medical Problem List and Plan: 1.  Altered mental status with dysarthria secondary to bilateral cerebral/cerebellar after cardiac catheterization/PAF  DC today  Will see patient for transitional care management in 1-2 weeks post-discharge 2.  Antithrombotics: -DVT/anticoagulation: SCDs             -antiplatelet therapy: Aspirin 81 mg daily and Plavix 75 mg daily 3. Pain Management: Tylenol as needed.   RLQ hematoma pain stable on 8/27 4. Mood: Provide emotional support             -antipsychotic agents: N/A 5. Neuropsych: This patient is capable of making decisions on her own behalf. 6. Skin/Wound Care: Routine skin checks 7. Fluids/Electrolytes/Nutrition: Routine in and outs. 8. HTN:  Continue amlodipine 10mg   Increase Imdur to 60mg , change bedtime on 8/25.    Very labile on 8/25, changed Imdur to at bedtime on 8/25  Remains labile, continue to monitor amatory setting with further adjustments 9.  Diabetes mellitus with hyperglycemia.  Hemoglobin A1c 7.3.  NovoLog 4 units every 4 hours.  Check blood sugars as directed.  Start elevated on 8/27, continue to monitor in outpatient setting 10.  Systolic congestive heart failure. Imdur 30 mg daily.  Monitor for any signs of fluid overload  Daily weights Filed Weights   06/06/20 0500 06/09/20 0500 06/10/20 0343  Weight: 117.1 kg 116.4 kg 114.4 kg   Improving on 8/27 11.  Morbid obesity.  BMI 44.24.  Dietary follow-up.  Encourage weight loss 12.  Hyperlipidemia.  Lipitor 13.  CAP.  IV Rocephin completed.  Check oxygen saturations every shift 14.  Post stroke dysphagia:   Now on regular thins  Advance diet as tolerated 15.  Leukocytosis  WBCs 14.6 on 8/19, follow-up as outpatient  Afebrile 16. Atrial fibrillation  Continue amiodarone 100 mg daily.   17.  Acute blood loss anemia  Hemoglobin 8.6 on 8/23, follow-up as outpatient  Continue to monitor 18.  Hypokalemia  Potassium 4.1 on 8/26  Supplemented increased on 8/20 19.  Hypoalbuminemia  Supplement initiated on 8/20 20. Right groin hematoma first noted after cardiac cath.   -Appreciate cardiology's eval. Continue ASA/Plavix given recent stent.    Warm compress  > 30 minutes spent in total in discharge planning between myself and PA regarding aforementioned, as well discussion regarding DME equipment, follow-up appointments, follow-up therapies, discharge medications, discharge recommendations, hematoma  LOS: 9 days A FACE TO FACE EVALUATION WAS PERFORMED  Sherry Fitzpatrick 06/10/2020, 9:16 AM

## 2020-06-10 NOTE — Plan of Care (Signed)
Problem: Consults °Goal: RH GENERAL PATIENT EDUCATION °Description: See Patient Education module for education specifics. °Outcome: Completed/Met °  °Problem: RH BOWEL ELIMINATION °Goal: RH STG MANAGE BOWEL WITH ASSISTANCE °Description: STG Manage Bowel with min Assistance. °Outcome: Completed/Met °Goal: RH STG MANAGE BOWEL W/MEDICATION W/ASSISTANCE °Description: STG Manage Bowel with Medication with min Assistance. °Outcome: Completed/Met °  °Problem: RH BLADDER ELIMINATION °Goal: RH STG MANAGE BLADDER WITH ASSISTANCE °Description: STG Manage Bladder With min Assistance °Outcome: Completed/Met °  °Problem: RH SKIN INTEGRITY °Goal: RH STG SKIN FREE OF INFECTION/BREAKDOWN °Description: Skin will be free of infection/breakdown with min assist °Outcome: Completed/Met °Goal: RH STG MAINTAIN SKIN INTEGRITY WITH ASSISTANCE °Description: STG Maintain Skin Integrity With min Assistance. °Outcome: Completed/Met °Goal: RH STG ABLE TO PERFORM INCISION/WOUND CARE W/ASSISTANCE °Description: STG Able To Perform Incision/Wound Care With min Assistance. °Outcome: Completed/Met °  °Problem: RH PAIN MANAGEMENT °Goal: RH STG PAIN MANAGED AT OR BELOW PT'S PAIN GOAL °Description: Pain will be managed at or below 3 out of 10 with min assist °Outcome: Completed/Met °  °Problem: RH KNOWLEDGE DEFICIT GENERAL °Goal: RH STG INCREASE KNOWLEDGE OF SELF CARE AFTER HOSPITALIZATION °Description: Pt will be able to verbalize self care when returning home with min assist °Outcome: Completed/Met °  °Problem: Consults °Goal: RH STROKE PATIENT EDUCATION °Description: See Patient Education module for education specifics  °Outcome: Completed/Met °  °Problem: RH KNOWLEDGE DEFICIT °Goal: RH STG INCREASE KNOWLEDGE OF DIABETES °Description: Patient will be able to manage DM including medications and dietary restrictions, CMM diet and exercise along with survival skills using handouts and educational resources with min assist °Outcome: Completed/Met °Goal:  RH STG INCREASE KNOWLEDGE OF HYPERTENSION °Description: Patient will be able to manage HTN including medications and dietary restrictions, CMM diet and exercise using handouts and educational resources with min assist °Outcome: Completed/Met °Goal: RH STG INCREASE KNOWLEGDE OF HYPERLIPIDEMIA °Description: Patient will be able to manage HLD including medications and dietary restrictions, CMM diet and exercise using handouts and educational resources with min assist °Outcome: Completed/Met °Goal: RH STG INCREASE KNOWLEDGE OF STROKE PROPHYLAXIS °Description: Patient will be able to manage stroke prophylaxis and medication regimen using handouts and educational resources with min assist °Outcome: Completed/Met °  °Problem: Education: °Goal: Ability to demonstrate management of disease process will improve °Description: Patient will be able to manage HF including medications and dietary restrictions, and exercise using handouts and educational resources with min assist °Outcome: Completed/Met °Goal: Ability to verbalize understanding of medication therapies will improve °Outcome: Completed/Met °Goal: Individualized Educational Video(s) °Outcome: Completed/Met °  °

## 2020-06-10 NOTE — Progress Notes (Addendum)
Inpatient Rehabilitation Care Coordinator  Discharge Note  The overall goal for the admission was met for:   Discharge location: Yes-HOME WITH DAUGHTER WHO WORKS FROM HOME AND CAN PROVIDE SUPERVISION LEVEL  Length of Stay: Yes-9 DAYS  Discharge activity level: Yes-SUPERVISION LEVEL  Home/community participation: Yes  Services provided included: MD, RD, PT, OT, SLP, RN, CM, Pharmacy, Neuropsych and SW  Financial Services: Private Insurance: [employer] MEDICARE  Follow-up services arranged: Home Health: [facility]-PT & OT and Patient/Family has no preference for HH/DME agencies  Comments (or additional information):PT DID WELL AND PROGRESSED TO SUPERVISION LEVEL. MAIN ISSUE WAS PAIN WHICH WILL TAKE TME TO GET BETTER  Patient/Family verbalized understanding of follow-up arrangements: Yes  Individual responsible for coordination of the follow-up plan: SELF & ANITA-DAUGHTER [phone]  Confirmed correct DME delivered: ,  G 06/10/2020    ,  G 

## 2020-06-14 DIAGNOSIS — E785 Hyperlipidemia, unspecified: Secondary | ICD-10-CM | POA: Diagnosis not present

## 2020-06-14 DIAGNOSIS — I2510 Atherosclerotic heart disease of native coronary artery without angina pectoris: Secondary | ICD-10-CM | POA: Diagnosis not present

## 2020-06-14 DIAGNOSIS — D649 Anemia, unspecified: Secondary | ICD-10-CM | POA: Diagnosis not present

## 2020-06-14 DIAGNOSIS — E119 Type 2 diabetes mellitus without complications: Secondary | ICD-10-CM | POA: Diagnosis not present

## 2020-06-14 DIAGNOSIS — I10 Essential (primary) hypertension: Secondary | ICD-10-CM | POA: Diagnosis not present

## 2020-06-16 DIAGNOSIS — I69391 Dysphagia following cerebral infarction: Secondary | ICD-10-CM | POA: Diagnosis not present

## 2020-06-16 DIAGNOSIS — R1310 Dysphagia, unspecified: Secondary | ICD-10-CM | POA: Diagnosis not present

## 2020-06-16 DIAGNOSIS — I2510 Atherosclerotic heart disease of native coronary artery without angina pectoris: Secondary | ICD-10-CM | POA: Diagnosis not present

## 2020-06-16 DIAGNOSIS — Z48812 Encounter for surgical aftercare following surgery on the circulatory system: Secondary | ICD-10-CM | POA: Diagnosis not present

## 2020-06-16 DIAGNOSIS — I69322 Dysarthria following cerebral infarction: Secondary | ICD-10-CM | POA: Diagnosis not present

## 2020-06-16 DIAGNOSIS — I110 Hypertensive heart disease with heart failure: Secondary | ICD-10-CM | POA: Diagnosis not present

## 2020-06-16 DIAGNOSIS — I69398 Other sequelae of cerebral infarction: Secondary | ICD-10-CM | POA: Diagnosis not present

## 2020-06-16 DIAGNOSIS — I10 Essential (primary) hypertension: Secondary | ICD-10-CM | POA: Diagnosis not present

## 2020-06-16 DIAGNOSIS — I214 Non-ST elevation (NSTEMI) myocardial infarction: Secondary | ICD-10-CM | POA: Diagnosis not present

## 2020-06-16 DIAGNOSIS — R4182 Altered mental status, unspecified: Secondary | ICD-10-CM | POA: Diagnosis not present

## 2020-06-21 DIAGNOSIS — I214 Non-ST elevation (NSTEMI) myocardial infarction: Secondary | ICD-10-CM | POA: Diagnosis not present

## 2020-06-21 DIAGNOSIS — Z48812 Encounter for surgical aftercare following surgery on the circulatory system: Secondary | ICD-10-CM | POA: Diagnosis not present

## 2020-06-21 DIAGNOSIS — I69391 Dysphagia following cerebral infarction: Secondary | ICD-10-CM | POA: Diagnosis not present

## 2020-06-21 DIAGNOSIS — I69322 Dysarthria following cerebral infarction: Secondary | ICD-10-CM | POA: Diagnosis not present

## 2020-06-21 DIAGNOSIS — R1310 Dysphagia, unspecified: Secondary | ICD-10-CM | POA: Diagnosis not present

## 2020-06-21 DIAGNOSIS — R4182 Altered mental status, unspecified: Secondary | ICD-10-CM | POA: Diagnosis not present

## 2020-06-21 DIAGNOSIS — I110 Hypertensive heart disease with heart failure: Secondary | ICD-10-CM | POA: Diagnosis not present

## 2020-06-21 DIAGNOSIS — I2510 Atherosclerotic heart disease of native coronary artery without angina pectoris: Secondary | ICD-10-CM | POA: Diagnosis not present

## 2020-06-21 DIAGNOSIS — I69398 Other sequelae of cerebral infarction: Secondary | ICD-10-CM | POA: Diagnosis not present

## 2020-06-23 ENCOUNTER — Encounter: Payer: Medicare HMO | Attending: Registered Nurse | Admitting: Registered Nurse

## 2020-06-23 DIAGNOSIS — R1310 Dysphagia, unspecified: Secondary | ICD-10-CM | POA: Diagnosis not present

## 2020-06-23 DIAGNOSIS — I110 Hypertensive heart disease with heart failure: Secondary | ICD-10-CM | POA: Diagnosis not present

## 2020-06-23 DIAGNOSIS — I69322 Dysarthria following cerebral infarction: Secondary | ICD-10-CM | POA: Diagnosis not present

## 2020-06-23 DIAGNOSIS — I69398 Other sequelae of cerebral infarction: Secondary | ICD-10-CM | POA: Diagnosis not present

## 2020-06-23 DIAGNOSIS — I2510 Atherosclerotic heart disease of native coronary artery without angina pectoris: Secondary | ICD-10-CM | POA: Diagnosis not present

## 2020-06-23 DIAGNOSIS — I69391 Dysphagia following cerebral infarction: Secondary | ICD-10-CM | POA: Diagnosis not present

## 2020-06-23 DIAGNOSIS — I214 Non-ST elevation (NSTEMI) myocardial infarction: Secondary | ICD-10-CM | POA: Diagnosis not present

## 2020-06-23 DIAGNOSIS — Z48812 Encounter for surgical aftercare following surgery on the circulatory system: Secondary | ICD-10-CM | POA: Diagnosis not present

## 2020-06-23 DIAGNOSIS — J441 Chronic obstructive pulmonary disease with (acute) exacerbation: Secondary | ICD-10-CM | POA: Diagnosis not present

## 2020-06-23 DIAGNOSIS — R4182 Altered mental status, unspecified: Secondary | ICD-10-CM | POA: Diagnosis not present

## 2020-06-27 DIAGNOSIS — D649 Anemia, unspecified: Secondary | ICD-10-CM | POA: Diagnosis not present

## 2020-06-27 DIAGNOSIS — I214 Non-ST elevation (NSTEMI) myocardial infarction: Secondary | ICD-10-CM | POA: Diagnosis not present

## 2020-06-27 DIAGNOSIS — I10 Essential (primary) hypertension: Secondary | ICD-10-CM | POA: Diagnosis not present

## 2020-06-27 DIAGNOSIS — R7989 Other specified abnormal findings of blood chemistry: Secondary | ICD-10-CM | POA: Diagnosis not present

## 2020-06-27 DIAGNOSIS — J449 Chronic obstructive pulmonary disease, unspecified: Secondary | ICD-10-CM | POA: Diagnosis not present

## 2020-06-28 DIAGNOSIS — Z48812 Encounter for surgical aftercare following surgery on the circulatory system: Secondary | ICD-10-CM | POA: Diagnosis not present

## 2020-06-28 DIAGNOSIS — I69322 Dysarthria following cerebral infarction: Secondary | ICD-10-CM | POA: Diagnosis not present

## 2020-06-28 DIAGNOSIS — R4182 Altered mental status, unspecified: Secondary | ICD-10-CM | POA: Diagnosis not present

## 2020-06-28 DIAGNOSIS — I69398 Other sequelae of cerebral infarction: Secondary | ICD-10-CM | POA: Diagnosis not present

## 2020-06-28 DIAGNOSIS — I110 Hypertensive heart disease with heart failure: Secondary | ICD-10-CM | POA: Diagnosis not present

## 2020-06-28 DIAGNOSIS — I69391 Dysphagia following cerebral infarction: Secondary | ICD-10-CM | POA: Diagnosis not present

## 2020-06-28 DIAGNOSIS — I214 Non-ST elevation (NSTEMI) myocardial infarction: Secondary | ICD-10-CM | POA: Diagnosis not present

## 2020-06-28 DIAGNOSIS — I2510 Atherosclerotic heart disease of native coronary artery without angina pectoris: Secondary | ICD-10-CM | POA: Diagnosis not present

## 2020-06-28 DIAGNOSIS — R1310 Dysphagia, unspecified: Secondary | ICD-10-CM | POA: Diagnosis not present

## 2020-07-07 DIAGNOSIS — R1310 Dysphagia, unspecified: Secondary | ICD-10-CM | POA: Diagnosis not present

## 2020-07-07 DIAGNOSIS — I214 Non-ST elevation (NSTEMI) myocardial infarction: Secondary | ICD-10-CM | POA: Diagnosis not present

## 2020-07-07 DIAGNOSIS — R4182 Altered mental status, unspecified: Secondary | ICD-10-CM | POA: Diagnosis not present

## 2020-07-07 DIAGNOSIS — I2510 Atherosclerotic heart disease of native coronary artery without angina pectoris: Secondary | ICD-10-CM | POA: Diagnosis not present

## 2020-07-07 DIAGNOSIS — Z48812 Encounter for surgical aftercare following surgery on the circulatory system: Secondary | ICD-10-CM | POA: Diagnosis not present

## 2020-07-07 DIAGNOSIS — I110 Hypertensive heart disease with heart failure: Secondary | ICD-10-CM | POA: Diagnosis not present

## 2020-07-07 DIAGNOSIS — I69391 Dysphagia following cerebral infarction: Secondary | ICD-10-CM | POA: Diagnosis not present

## 2020-07-07 DIAGNOSIS — I69398 Other sequelae of cerebral infarction: Secondary | ICD-10-CM | POA: Diagnosis not present

## 2020-07-07 DIAGNOSIS — I69322 Dysarthria following cerebral infarction: Secondary | ICD-10-CM | POA: Diagnosis not present

## 2020-07-13 ENCOUNTER — Ambulatory Visit (INDEPENDENT_AMBULATORY_CARE_PROVIDER_SITE_OTHER): Payer: Medicare HMO | Admitting: Adult Health

## 2020-07-13 ENCOUNTER — Other Ambulatory Visit: Payer: Self-pay

## 2020-07-13 ENCOUNTER — Encounter: Payer: Self-pay | Admitting: Adult Health

## 2020-07-13 VITALS — BP 168/89 | HR 70 | Ht 64.0 in | Wt 239.0 lb

## 2020-07-13 DIAGNOSIS — I639 Cerebral infarction, unspecified: Secondary | ICD-10-CM | POA: Diagnosis not present

## 2020-07-13 DIAGNOSIS — E1165 Type 2 diabetes mellitus with hyperglycemia: Secondary | ICD-10-CM | POA: Diagnosis not present

## 2020-07-13 DIAGNOSIS — E785 Hyperlipidemia, unspecified: Secondary | ICD-10-CM | POA: Diagnosis not present

## 2020-07-13 DIAGNOSIS — I2510 Atherosclerotic heart disease of native coronary artery without angina pectoris: Secondary | ICD-10-CM | POA: Diagnosis not present

## 2020-07-13 DIAGNOSIS — I10 Essential (primary) hypertension: Secondary | ICD-10-CM

## 2020-07-13 DIAGNOSIS — Z794 Long term (current) use of insulin: Secondary | ICD-10-CM | POA: Diagnosis not present

## 2020-07-13 DIAGNOSIS — G4733 Obstructive sleep apnea (adult) (pediatric): Secondary | ICD-10-CM | POA: Diagnosis not present

## 2020-07-13 DIAGNOSIS — E119 Type 2 diabetes mellitus without complications: Secondary | ICD-10-CM | POA: Diagnosis not present

## 2020-07-13 NOTE — Progress Notes (Signed)
[facility] [address] [place]. [place] [address] [phone]       HOSPITAL FOLLOW UP NOTE  [name] Date of Birth:  [birth year] Medical Record Number:  [id number]   Reason for Referral:  hospital stroke follow up    SUBJECTIVE:   CHIEF COMPLAINT:  Chief Complaint  Patient presents with  . Hospitalization Follow-up    rm 9  . Cerebrovascular Accident    Pt is having no new sx. Pt has problems with her memory.    HPI:   [name] is a 69 y.o. female with history of essential hypertension, hx of stroke 03/2020 with no residual effects, aortic arthrosclerosis, CKD, obesity, OSA, hyperlipidemia, and heart murmur admitted to [facility] 05/24/2020 with a diagnosis of non-STEMI -> left cardiac catheterization/PTCA stenting to the mid LAD on 05/24/20 with resultant abdominal hematoma with abdominal pain.  On 813, patient developed further abdominal pain, hypotensive and unresponsive followed by seizure episode.   Neurology consulted and on arrival pt found with intermittent bilateral extensor posturing, right gaze deviation, weak gag and pupils mid and fixed with hypotension - SBP 64.  She did not receive IV t-PA due to instability.  Stroke work-up revealed patchy small volume acute ischemic infarcts involving the right splenium and right cerebellar white matter, right hippocampus with additional punctate acute ischemic left cerebellar infarcts, likely embolic from new atrial fibrillation vs post STEMI procedure.  MRA showed extensive intracranial atherosclerotic disease with multifocal moderate to severe stenosis involving both the anterior and posterior circulation and bilateral ICA 30 to 40% stenosis.  Recommended continuation of DAPT with aspirin and Plavix with consideration of long-term anticoagulation for new onset A. fib once stable. Hx of HTN stable.  LDL 145 recommend continuation of atorvastatin 80 mg daily.  Controlled DM with A1c 7.9.  Other stroke  risk factors include advanced age, EtOH use, obesity, hx of stroke 03/2020, CAD and OSA not on CPAP.  Stroke: Patchy small volume acute ischemic infarcts involving the right splenium and right cerebral white matter, right hippocampus with additional punctate acute ischemic left cerebellar infarct - likely embolic from new atrial fibrillation versus post STEMI procedure  Resultant right hemiparesis     CT head - No acute intracranial pathology. Mild chronic microvascular ischemic changes.  MRI head - Patchy small volume acute ischemic infarcts involving the right splenium and right cerebral white matter as above, with additional punctate acute ischemic left cerebellar infarct. Underlying age-related cerebral atrophy with moderate chronic microvascular ischemic disease.   MRA head - Extensive intracranial atherosclerotic disease with multifocal moderate to severe stenoses involving both the anterior and posterior circulation.  MRA Neck - Approximate 30-40% stenoses about the origins of both internal carotid arteries, left slightly worse than right  CTA H&N - not ordered  CT Perfusion - not ordered  Carotid Doppler - MRA neck ordered - carotid dopplers not indicated.  EEG - This study is suggestive of severe diffuse encephalopathy, nonspecific etiology but likely related to sedation. No seizures or epileptiform discharges were seen throughout the recording.   2D Echo - 03/28/20 - EF 60 - 65%. No cardiac source of emboli identified.   Sars Corona Virus 2 - negative  LDL - 145  HgbA1c - 7.3  UDS - not ordered  VTE prophylaxis - SCDs  Aspirin 81 mg and Plavix 75 mg daily prior to admission, now on Aspirin 81 mg and Plavix 75 mg daily  Patient counseled to be compliant with her  antithrombotic medications  Ongoing aggressive stroke risk factor management  Therapy recommendations: CIR  Disposition:  CIR  Today, 07/13/2020, Sherry Fitzpatrick is being seen for hospital follow-up accompanied  by her daughter.  She was discharged home from CIR on 06/10/2020 after a 9 day stay.  She has been doing well since discharge but complains of cognitive deficits especially with short-term memory. Per daughter, cognitive concerns prior to recent stroke but greatly worsened post stroke.  She does report some improvement since discharge.  Denies residual weakness or speech difficulty. She continues to experience RLQ pain from hematoma but overall greatly improving. Continues on DAPT with aspirin and Plavix without bleeding or bruising.  Remains on atorvastatin 80 mg daily without myalgias.  Blood pressure today 168/89.  Does not routinely monitor at home.  Remains on Metformin for DM management.  She does report daytime fatigue, insomnia and snoring.  Previously diagnosed with sleep apnea in 2014 with a split-night study in 2014 through [facility] but she does not remember initiating treatment.  No further concerns at this time.     ROS:   14 system review of systems performed and negative with exception of those listed in HPI  PMH:  Past Medical History:  Diagnosis Date  . Alterations of sensations, late effect of cerebrovascular disease(438.6)   . Arthritis    "all over"  . Backache, unspecified   . Body mass index 40.0-44.9, adult ([place])   . Carpal tunnel syndrome   . Dizziness and giddiness   . Esophageal reflux   . Generalized pain   . Headache    "had alot til I had pituitary tumor removed"  . Heart murmur   . Other abnormal glucose   . Other and unspecified hyperlipidemia   . Other malaise and fatigue   . Other postprocedural status(V45.89)   . Other symptoms involving cardiovascular system   . Sleep apnea    "did test; they wanted me to get a CPAP but I never did get it" (04/14/2015) Does use inhaler at night (01 2021)  . Stroke ([place]) 06/2003; 02/2010   "minor; minor" ; denies residual on 04/14/2015  . Unspecified disorder of the pituitary gland and its hypothalamic control   . Unspecified  essential hypertension   . Unspecified visual disturbance     PSH:  Past Surgical History:  Procedure Laterality Date  . CARPAL TUNNEL RELEASE Left ~ 2014  . CORONARY STENT INTERVENTION N/A 05/24/2020   Procedure: CORONARY STENT INTERVENTION;  Surgeon: Jettie Booze, MD;  Location: [facility];  Service: Cardiovascular;  Laterality: N/A;  . JOINT REPLACEMENT    . LEFT HEART CATH AND CORONARY ANGIOGRAPHY N/A 05/24/2020   Procedure: LEFT HEART CATH AND CORONARY ANGIOGRAPHY;  Surgeon: Charolette Forward, MD;  Location: [facility];  Service: Cardiovascular;  Laterality: N/A;  . TOTAL HIP ARTHROPLASTY Right 11/06/2010  . TRANSPHENOIDAL / TRANSNASAL HYPOPHYSECTOMY / RESECTION PITUITARY TUMOR  06/13/2012  . TUBAL LIGATION  1974    Social History:  Social History   Socioeconomic History  . Marital status: Divorced    Spouse name: Not on file  . Number of children: Not on file  . Years of education: Not on file  . Highest education level: Not on file  Occupational History  . Occupation: unemployed  Tobacco Use  . Smoking status: Never Smoker  . Smokeless tobacco: Never Used  Vaping Use  . Vaping Use: Never used  Substance and Sexual Activity  . Alcohol use: Yes  Alcohol/week: 0.0 standard drinks    Comment: rarely; "I don't drink.  But I like it."  . Drug use: No  . Sexual activity: Yes  Other Topics Concern  . Not on file  Social History Narrative  . Not on file   Social Determinants of Health   Financial Resource Strain:   . Difficulty of Paying Living Expenses: Not on file  Food Insecurity:   . Worried About Charity fundraiser in the Last Year: Not on file  . Ran Out of Food in the Last Year: Not on file  Transportation Needs:   . Lack of Transportation (Medical): Not on file  . Lack of Transportation (Non-Medical): Not on file  Physical Activity:   . Days of Exercise per Week: Not on file  . Minutes of Exercise per Session: Not on file  Stress:     . Feeling of Stress : Not on file  Social Connections:   . Frequency of Communication with Friends and Family: Not on file  . Frequency of Social Gatherings with Friends and Family: Not on file  . Attends Religious Services: Not on file  . Active Member of Clubs or Organizations: Not on file  . Attends Archivist Meetings: Not on file  . Marital Status: Not on file  Intimate Partner Violence:   . Fear of Current or Ex-Partner: Not on file  . Emotionally Abused: Not on file  . Physically Abused: Not on file  . Sexually Abused: Not on file    Family History:  Family History  Problem Relation Age of Onset  . Heart failure Mother   . Hypertension Mother   . Diabetes Father   . Hypertension Sister   . Heart failure Sister   . Hypertension Daughter     Medications:   Current Outpatient Medications on File Prior to Visit  Medication Sig Dispense Refill  . acetaminophen (TYLENOL) 325 MG tablet Take 2 tablets (650 mg total) by mouth every 6 (six) hours as needed (Mild pain).    Marland Kitchen albuterol (VENTOLIN HFA) 108 (90 Base) MCG/ACT inhaler Inhale 2 puffs into the lungs every 6 (six) hours as needed for wheezing. 6.7 g 0  . amiodarone (PACERONE) 100 MG tablet Take 1 tablet (100 mg total) by mouth daily. 30 tablet 0  . amLODipine (NORVASC) 10 MG tablet Take 1 tablet (10 mg total) by mouth daily. 30 tablet 0  . aspirin 81 MG chewable tablet Chew 81 mg by mouth daily.    Marland Kitchen atorvastatin (LIPITOR) 80 MG tablet Take 1 tablet (80 mg total) by mouth daily. 30 tablet 3  . budesonide-formoterol (SYMBICORT) 160-4.5 MCG/ACT inhaler Inhale 2 puffs into the lungs 2 (two) times daily. 6 g 0  . carvedilol (COREG) 6.25 MG tablet Take 1 tablet (6.25 mg total) by mouth 2 (two) times daily. 30 tablet 0  . clopidogrel (PLAVIX) 75 MG tablet Take 1 tablet (75 mg total) by mouth daily. 30 tablet 3  . docusate sodium (COLACE) 100 MG capsule Take 1 capsule (100 mg total) by mouth 2 (two) times daily. 10  capsule 0  . ferrous sulfate 325 (65 FE) MG tablet Take 1 tablet (325 mg total) by mouth 3 (three) times daily with meals. 90 tablet 3  . isosorbide mononitrate (IMDUR) 60 MG 24 hr tablet Take 1 tablet (60 mg total) by mouth at bedtime. 30 tablet 0  . metFORMIN (GLUCOPHAGE) 500 MG tablet Take 1 tablet (500 mg total) by mouth daily with  breakfast. 30 tablet 1  . nitroGLYCERIN (NITROSTAT) 0.4 MG SL tablet Place 1 tablet (0.4 mg total) under the tongue every 5 (five) minutes x 3 doses as needed for chest pain. 25 tablet 12  . pantoprazole (PROTONIX) 40 MG tablet Take 1 tablet (40 mg total) by mouth at bedtime. 30 tablet 3  . polyethylene glycol (MIRALAX / GLYCOLAX) 17 g packet Take 17 g by mouth daily. 14 each 0  . potassium chloride (KLOR-CON) 10 MEQ tablet Take 2 tablets (20 mEq total) by mouth daily. 60 tablet 1  . traMADol (ULTRAM) 50 MG tablet Take 1 tablet (50 mg total) by mouth every 6 (six) hours as needed for moderate pain or severe pain. 30 tablet 0  . valsartan (DIOVAN) 160 MG tablet Take 1 tablet (160 mg total) by mouth daily. 30 tablet 3  . furosemide (LASIX) 40 MG tablet Take 1 tablet (40 mg total) by mouth daily at 2 PM. 30 tablet 0   No current facility-administered medications on file prior to visit.    Allergies:   Allergies  Allergen Reactions  . Morphine And Related Hives and Nausea And Vomiting  . Penicillins Hives    Has patient had a PCN reaction causing immediate rash, facial/tongue/throat swelling, SOB or lightheadedness with hypotension: Yes Has patient had a PCN reaction causing severe rash involving mucus membranes or skin necrosis: No Has patient had a PCN reaction that required hospitalization: No Has patient had a PCN reaction occurring within the last 10 years: No If all of the above answers are "NO", then may proceed with Cephalosporin use.  Marland Kitchen Percocet [Oxycodone-Acetaminophen] Nausea And Vomiting  . Ace Inhibitors Nausea And Vomiting and Cough  . Iodinated  Diagnostic Agents Nausea And Vomiting       . Iodine Itching  . Etodolac Nausea And Vomiting  . Tramadol Nausea And Vomiting  . Vicodin [Hydrocodone-Acetaminophen] Nausea And Vomiting      OBJECTIVE:  Physical Exam  Vitals:   07/13/20 1247  BP: (!) 168/89  Pulse: 70  Weight: 239 lb (108.4 kg)  Height: 5\' 4"  (1.626 m)   Body mass index is 41.02 kg/m. No exam data present  General: Morbidly obese very pleasant elderly African-American female, seated, in no evident distress Head: head normocephalic and atraumatic.   Neck: supple with no carotid or supraclavicular bruits Cardiovascular: regular rate and rhythm, no murmurs Musculoskeletal: no deformity Skin:  no rash/petichiae Vascular:  Normal pulses all extremities   Neurologic Exam Mental Status: Awake and fully alert.   Fluent speech and language.  Oriented to place and time. Recent memory impaired and remote memory intact. Attention span, concentration and fund of knowledge mostly appropriate throughout visit. Mood and affect appropriate. MMSE 26/30 with deficit in recall Cranial Nerves: Fundoscopic exam reveals sharp disc margins. Pupils equal, briskly reactive to light. Extraocular movements full without nystagmus. Visual fields full to confrontation. Hearing intact. Facial sensation intact.  Right lower facial weakness.  Tongue, and palate moves normally and symmetrically.  Motor: Normal bulk and tone. Normal strength in all tested extremity muscles. Sensory.: intact to touch , pinprick , position and vibratory sensation.  Coordination: Rapid alternating movements normal in all extremities. Finger-to-nose and heel-to-shin performed accurately bilaterally. Gait and Station: Arises from chair without difficulty. Stance is normal. Gait demonstrates normal stride length and balance with use of Rollator walker Reflexes: 1+ and symmetric. Toes downgoing.     NIHSS  1 Modified Rankin  3     ASSESSMENT: Sherry [place]  Fitzpatrick  is a 69 y.o. year old female admitted on 05/24/2020 with diagnosis of NSTEMI s/p cardiac cath/PTCA stenting with subsequent AMS, intermittent bilateral extensor posturing, right gaze deviation, weak gag, pupils mid and fixed with hypotension on 8/13 with stroke work-up revealing patchy small acute ischemic infarcts involving right splenium and right cerebellar white matter, right hippocampus with additional punctate acute ischemic left cerebellar infarcts, likely embolic secondary to new atrial fibrillation vs post STEMI procedure. Vascular risk factors include HTN, HLD, DM, hx of stroke 03/2020, OSA not on CPAP, morbid obesity, new onset A. fib, and CAD.      PLAN:  1. Embolic strokes:  a. Residual deficit: Worsening cognitive impairment and right facial weakness.  She is not interested in pursuing additional speech therapy at this time but will call office in the future if interested b. Continue aspirin 81 mg daily and clopidogrel 75 mg daily  and atorvastatin 80 mg daily for secondary stroke prevention.  c. Discussed secondary stroke prevention measures and importance of close PCP follow up for aggressive stroke risk factor management  2. HTN: BP goal <130/90.  Elevated today.  Discussed importance of monitoring blood pressure at home and ongoing follow-up with cardiology/PCP for monitoring management 3. HLD: LDL goal <70. Recent LDL 145. Continue atorvastatin 80 mg daily.  Managed and prescribed by PCP 4. DMII: A1c goal<7.0. Recent A1c 7.3. F/u with PCP 5. OSA: Prior diagnosis in 2014.  Referral placed to [place] [facility] for reevaluation of sleep apnea 6. Postprocedure atrial fibrillation: Consider initiating anticoagulation for secondary stroke prevention once DAPT duration completed post stent.  Duration and initiation defer to cardiology 7. NSTEMI s/p stent: DAPT duration determined by cardiology.  Continue to follow with Dr. Terrence Dupont    Follow up in 3 months or call earlier if needed   I  spent 50 minutes of face-to-face and non-face-to-face time with patient and daughter.  This included previsit chart review including recent hospitalization review, lab review, study review, order entry, electronic health record documentation, patient education and discussion regarding recent stroke, residual deficits, secondary stroke prevention measures and importance of managing stroke risk factors and answered all questions to patient and daughters satisfaction   Frann Rider, AGNP-BC  [facility] [address] [place], [place] [address]  Phone [phone] Fax [phone] Note: This document was prepared with digital dictation and possible smart phrase technology. Any transcriptional errors that result from this process are unintentional.

## 2020-07-13 NOTE — Patient Instructions (Signed)
Referral to sleep evaluation to assess for sleep apnea  Please call office if you would like to pursue cognitive therapy  Continue to follow with cardiology for monitoring and management  Continue aspirin 81 mg daily and clopidogrel 75 mg daily  and atorvastatin 80mg   for secondary stroke prevention  Continue to follow up with PCP regarding cholesterol, blood pressure and diabetes management  Maintain strict control of hypertension with blood pressure goal below 130/90, diabetes with hemoglobin A1c goal below 7.0% and cholesterol with LDL cholesterol (bad cholesterol) goal below 70 mg/dL.       Followup in the future with me in 3 months or call earlier if needed       Thank you for coming to see Sherry Fitzpatrick at [facility]. I hope we have been able to provide you high quality care today.  You may receive a patient satisfaction survey over the next few weeks. We would appreciate your feedback and comments so that we may continue to improve ourselves and the health of our patients.      Vascular Dementia Dementia is a condition in which a person has problems with thinking, memory, and behavior that are severe enough to interfere with daily life. Vascular dementia is a type of dementia. It results from brain damage that is caused by the brain not getting enough blood. This condition may also be called vascular cognitive impairment. What are the causes? Vascular dementia is caused by conditions that lessen blood flow to the brain. Common causes of this condition include:  Multiple small strokes. These may happen without symptoms (silent stroke).  Major stroke.  Damage to small blood vessels in the brain (cerebral small vessel disease). What increases the risk? The following factors may make you more likely to develop this condition:  Having had a stroke.  Having high blood pressure (hypertension) or high cholesterol.  Having a disease that affects the heart or  blood vessels.  Smoking.  Having diabetes.  Having metabolic syndrome.  Being obese.  Not being active.  Having depression.  Being over age 36. What are the signs or symptoms? Symptoms can vary from one person to another. Symptoms may be mild or severe depending on the amount of damage and which parts of the brain have been affected. Symptoms may begin suddenly or may develop slowly. Mental symptoms of vascular dementia may include:  Confusion.  Memory problems.  Poor attention and concentration.  Trouble understanding speech.  Depression.  Personality changes.  Trouble recognizing familiar people.  Agitation or aggression.  Paranoia.  Delusions or hallucinations. Physical symptoms of vascular dementia may include:  Weakness.  Poor balance.  Loss of bladder or bowel control (incontinence).  Unsteady walking (gait).  Speaking problems. Behavioral symptoms of vascular dementia may include:  Getting lost in familiar places.  Problems with planning and judgment.  Trouble following instructions.  Social problems.  Emotional outbursts.  Trouble with daily activities and self-care.  Problems handling money. Symptoms may remain stable, or they may get worse over time. Symptoms of vascular dementia may be similar to those of Alzheimer's disease. The two conditions can occur together (mixed dementia). How is this diagnosed? Your health care provider will consider your medical history and symptoms or changes that are reported by friends and family. Your health care provider will do a physical exam and may order lab tests or other tests that check brain and nervous system function. Tests that may be done include:  Blood tests.  Brain imaging tests.  Tests of movement, speech, and other daily activities (neurological exam).  Tests of memory, thinking, and problem-solving (neuropsychological or neurocognitive testing). There is not a specific test to  diagnose vascular dementia. Diagnosis may involve several specialists. These may include:  A health care provider who specializes in the brain and nervous system (neurologist).  A health provider who specializes in understanding how problems in the brain can alter behavior and cognitive function (neuropsychologist). How is this treated? There is no cure for vascular dementia. Brain damage that has already occurred cannot be reversed. Treatment depends on:  How severe the condition is.  Which parts of your brain have been affected.  Your overall health. Treatment measures aim to:  Treat the underlying cause of vascular dementia and manage risk factors. This may include: ? Controlling blood pressure. ? Lowering cholesterol. ? Treating diabetes. ? Quitting smoking. ? Losing weight or maintaining a healthy weight. ? Eating a healthy, balanced diet. ? Getting regular exercise.  Manage symptoms.  Prevent further brain damage.  Improve the person's health and quality of life. Treatment for dementia may involve a team of health care providers, including:  A neurologist.  A provider who specializes in disorders of the mind (psychiatrist).  A provider who specializes in helping people learn daily living skills (occupational therapist).  A provider who focuses on speech and language changes (Electrical engineer).  A heart specialist (cardiologist).  A provider who helps people learn how to manage physical changes, such as movement and walking (exercise physiologist or physical therapist). Follow these instructions at home: Lifestyle  People with vascular dementia may need regular help at home or daily care from a family member or home health care worker. Home care for a person with vascular dementia depends on what caused the condition and how severe the symptoms are. General guidelines for caregivers include:  Help the person with dementia remember people, appointments, and daily  activities.  Help the person with dementia manage his or her medicines.  Help family and friends learn about ways to communicate with the person with dementia.  Create a safe living space to reduce the risk of injury or falls.  Find a support group to help caregivers and family cope with the effects of dementia.  General instructions  Help the person take over-the-counter and prescription medicines only as told by the health care provider.  Follow the health care provider's instructions for treating the condition that caused the dementia.  Make sure the person keeps all follow-up visits as told by the health care provider. This is important. Contact a health care provider if:  A fever develops.  New behavioral problems develop.  Problems with swallowing develop.  Confusion gets worse.  Sleepiness gets worse. Get help right away if:  Loss of consciousness occurs.  There is a sudden loss of speech, balance, or thinking ability.  New numbness or paralysis occurs.  Sudden, severe headache occurs.  Vision is lost or suddenly gets worse in one or both eyes. Summary  Vascular dementia is a type of dementia. It results from brain damage that is caused by the brain not getting enough blood.  Vascular dementia is caused by conditions that lessen blood flow to the brain. Common causes of this condition include stroke and damage to small blood vessels in the brain.  Treatment focuses on treating the underlying cause of vascular dementia and managing any risk factors.  People with vascular dementia may need regular help at home or daily care from a family  member or home health care worker.  Contact a health care provider if you or your caregiver notice any new symptoms. This information is not intended to replace advice given to you by your health care provider. Make sure you discuss any questions you have with your health care provider. Document Revised: 07/02/2018 Document  Reviewed: 07/03/2018 Elsevier Patient Education  [place].

## 2020-07-14 ENCOUNTER — Telehealth (HOSPITAL_COMMUNITY): Payer: Self-pay

## 2020-07-14 NOTE — Telephone Encounter (Signed)
Cardiac Rehab Note:  Called given number in EMR to assess patient's for readiness for cardiac rehab secondary to recent stroke with documented memory  deficits. Daughter [place], listed on DPR, answered patients phone. Introduced myself with name, credentials, and department calling from. Asked daughter to confirm patients date of birth for HIPAA identification. Daughter refused and hung up phone. Will accept daughters refusal to identify patient as a decline of cardiac rehab services. Referral will be closed.  Sherry Fitzpatrick E. Rollene Rotunda RN, BSN [place]. [facility]  Cardiac and Pulmonary Rehabilitation Phone: [phone] Fax: [phone]

## 2020-07-14 NOTE — Progress Notes (Signed)
I agree with the above plan 

## 2020-07-23 DIAGNOSIS — J441 Chronic obstructive pulmonary disease with (acute) exacerbation: Secondary | ICD-10-CM | POA: Diagnosis not present

## 2020-08-08 ENCOUNTER — Emergency Department (HOSPITAL_COMMUNITY): Payer: Medicare HMO

## 2020-08-08 ENCOUNTER — Encounter (HOSPITAL_COMMUNITY): Payer: Self-pay | Admitting: Emergency Medicine

## 2020-08-08 ENCOUNTER — Emergency Department (HOSPITAL_COMMUNITY)
Admission: EM | Admit: 2020-08-08 | Discharge: 2020-08-08 | Disposition: A | Discharge status: Home / Self Care | Payer: Medicare HMO | Attending: Emergency Medicine | Admitting: Emergency Medicine

## 2020-08-08 ENCOUNTER — Other Ambulatory Visit: Payer: Self-pay

## 2020-08-08 DIAGNOSIS — R0789 Other chest pain: Secondary | ICD-10-CM | POA: Diagnosis not present

## 2020-08-08 DIAGNOSIS — Z7984 Long term (current) use of oral hypoglycemic drugs: Secondary | ICD-10-CM | POA: Insufficient documentation

## 2020-08-08 DIAGNOSIS — E1165 Type 2 diabetes mellitus with hyperglycemia: Secondary | ICD-10-CM | POA: Diagnosis not present

## 2020-08-08 DIAGNOSIS — N183 Chronic kidney disease, stage 3 unspecified: Secondary | ICD-10-CM | POA: Diagnosis not present

## 2020-08-08 DIAGNOSIS — Z79899 Other long term (current) drug therapy: Secondary | ICD-10-CM | POA: Diagnosis not present

## 2020-08-08 DIAGNOSIS — I130 Hypertensive heart and chronic kidney disease with heart failure and stage 1 through stage 4 chronic kidney disease, or unspecified chronic kidney disease: Secondary | ICD-10-CM | POA: Diagnosis not present

## 2020-08-08 DIAGNOSIS — R0602 Shortness of breath: Secondary | ICD-10-CM | POA: Insufficient documentation

## 2020-08-08 DIAGNOSIS — R5383 Other fatigue: Secondary | ICD-10-CM | POA: Diagnosis not present

## 2020-08-08 DIAGNOSIS — Z96641 Presence of right artificial hip joint: Secondary | ICD-10-CM | POA: Insufficient documentation

## 2020-08-08 DIAGNOSIS — J449 Chronic obstructive pulmonary disease, unspecified: Secondary | ICD-10-CM | POA: Insufficient documentation

## 2020-08-08 DIAGNOSIS — R079 Chest pain, unspecified: Secondary | ICD-10-CM | POA: Insufficient documentation

## 2020-08-08 DIAGNOSIS — Z23 Encounter for immunization: Secondary | ICD-10-CM | POA: Diagnosis not present

## 2020-08-08 DIAGNOSIS — Z7982 Long term (current) use of aspirin: Secondary | ICD-10-CM | POA: Diagnosis not present

## 2020-08-08 DIAGNOSIS — Z7951 Long term (current) use of inhaled steroids: Secondary | ICD-10-CM | POA: Diagnosis not present

## 2020-08-08 DIAGNOSIS — Z794 Long term (current) use of insulin: Secondary | ICD-10-CM | POA: Diagnosis not present

## 2020-08-08 DIAGNOSIS — I10 Essential (primary) hypertension: Secondary | ICD-10-CM | POA: Diagnosis not present

## 2020-08-08 DIAGNOSIS — I5042 Chronic combined systolic (congestive) and diastolic (congestive) heart failure: Secondary | ICD-10-CM | POA: Insufficient documentation

## 2020-08-08 LAB — CBC
HCT: 38.5 % (ref 36.0–46.0)
Hemoglobin: 12.2 g/dL (ref 12.0–15.0)
MCH: 29 pg (ref 26.0–34.0)
MCHC: 31.7 g/dL (ref 30.0–36.0)
MCV: 91.7 fL (ref 80.0–100.0)
Platelets: 307 10*3/uL (ref 150–400)
RBC: 4.2 MIL/uL (ref 3.87–5.11)
RDW: 14.6 % (ref 11.5–15.5)
WBC: 7.9 10*3/uL (ref 4.0–10.5)
nRBC: 0 % (ref 0.0–0.2)

## 2020-08-08 LAB — BASIC METABOLIC PANEL
Anion gap: 10 (ref 5–15)
BUN: 15 mg/dL (ref 8–23)
CO2: 26 mmol/L (ref 22–32)
Calcium: 9.2 mg/dL (ref 8.9–10.3)
Chloride: 105 mmol/L (ref 98–111)
Creatinine, Ser: 0.92 mg/dL (ref 0.44–1.00)
GFR, Estimated: >60 mL/min (ref >60)
Glucose, Bld: 109 mg/dL — ABNORMAL HIGH (ref 70–99)
Potassium: 3.7 mmol/L (ref 3.5–5.1)
Sodium: 141 mmol/L (ref 135–145)

## 2020-08-08 LAB — TROPONIN I (HIGH SENSITIVITY)
Troponin I (High Sensitivity): 6 ng/L (ref <18)
Troponin I (High Sensitivity): 6 ng/L (ref <18)

## 2020-08-08 MED ORDER — NITROGLYCERIN 0.4 MG SL SUBL
0.4000 mg | SUBLINGUAL_TABLET | SUBLINGUAL | Status: DC | PRN
  Filled 2020-08-08: qty 1

## 2020-08-08 NOTE — ED Provider Notes (Signed)
[facility] EMERGENCY DEPARTMENT Provider Note   CSN: [id number] Arrival date & time: 08/08/20  1153     History Chief Complaint  Patient presents with  . Chest Pain    Sherry Fitzpatrick is a 69 y.o. female.  HPI 69 year old female presents with chest pain.  Originally started 3 days ago, seem to go away 2 days ago and then came back yesterday.  It feels like a pressure in her chest.  At one point was going up to her left shoulder but otherwise no radiation.  She does feel short of breath.  No leg swelling.  No fever, back pain, abdominal pain.  Pain was severe early on but with 2 nitroglycerin by EMS it is down to a 4/10.  She was also given 4 baby aspirin.   Past Medical History:  Diagnosis Date  . Alterations of sensations, late effect of cerebrovascular disease(438.6)   . Arthritis    "all over"  . Backache, unspecified   . Body mass index 40.0-44.9, adult ([place])   . Carpal tunnel syndrome   . Dizziness and giddiness   . Esophageal reflux   . Generalized pain   . Headache    "had alot til I had pituitary tumor removed"  . Heart murmur   . Other abnormal glucose   . Other and unspecified hyperlipidemia   . Other malaise and fatigue   . Other postprocedural status(V45.89)   . Other symptoms involving cardiovascular system   . Sleep apnea    "did test; they wanted me to get a CPAP but I never did get it" (04/14/2015) Does use inhaler at night (01 2021)  . Stroke ([place]) 06/2003; 02/2010   "minor; minor" ; denies residual on 04/14/2015  . Unspecified disorder of the pituitary gland and its hypothalamic control   . Unspecified essential hypertension   . Unspecified visual disturbance     Patient Active Problem List   Diagnosis Date Noted  . Abdominal pain   . Type 2 diabetes mellitus with hyperglycemia, with long-term current use of insulin ([place])   . Benign essential HTN   . Labile blood pressure   . Acute blood loss anemia   . Hypoalbuminemia due to  protein-calorie malnutrition ([place])   . Acute cerebral infarction ([place]) 06/02/2020  . Leukocytosis   . Systolic congestive heart failure ([place])   . PAF (paroxysmal atrial fibrillation) ([place])   . Cerebral infarction involving left cerebellar artery ([place]) 06/01/2020  . Atrial fibrillation ([place])   . Chronic systolic congestive heart failure ([place])   . Controlled type 2 diabetes mellitus with hyperglycemia, with long-term current use of insulin ([place])   . NSTEMI (non-ST elevated myocardial infarction) ([place])   . Status post coronary artery stent placement   . Dysphagia, post-stroke   . Cerebral thrombosis with cerebral infarction 05/29/2020  . Shock ([place])   . Acute non-Q wave non-ST elevation myocardial infarction (NSTEMI) ([place]) 05/24/2020  . Hypertensive encephalopathy 03/28/2020  . Gout attack 03/28/2020  . Stroke-like symptoms 03/27/2020  . Normocytic anemia 03/27/2020  . History of CVA (cerebrovascular accident) 03/27/2020  . Acute respiratory failure with hypoxia ([place]) 11/22/2019  . COPD with acute exacerbation ([place]) 11/22/2019  . Acute asthma exacerbation 11/22/2019  . Morbid obesity ([place]) 10/29/2019  . CKD (chronic kidney disease), stage III ([place]) 10/29/2019  . Reactive airway disease without complication   . Dyspnea 07/13/2019  . Acute ischemic stroke ([place]) 05/05/2019  . AKI (acute kidney injury) ([place]) 07/03/2018  .  Hypertensive emergency 07/01/2018  . Tobacco abuse 07/01/2018  . Hypokalemia 07/01/2018  . Chronic diastolic CHF (congestive heart failure) ([place]) 07/01/2018  . Positive D dimer   . Hypertensive urgency 02/22/2018  . Malignant hypertension 02/20/2018  . Shortness of breath 02/20/2018  . Stroke ([place])   . Chest pain 04/14/2015  . Right sided weakness 04/14/2015  . Hyperlipidemia   . Body mass index 40.0-44.9, adult ([place])   . Essential hypertension   . Blood glucose elevated   . Asthma   . OSA (obstructive sleep apnea) 05/06/2013    Past Surgical History:    Procedure Laterality Date  . CARPAL TUNNEL RELEASE Left ~ 2014  . CORONARY STENT INTERVENTION N/A 05/24/2020   Procedure: CORONARY STENT INTERVENTION;  Surgeon: Jettie Booze, MD;  Location: [facility];  Service: Cardiovascular;  Laterality: N/A;  . JOINT REPLACEMENT    . LEFT HEART CATH AND CORONARY ANGIOGRAPHY N/A 05/24/2020   Procedure: LEFT HEART CATH AND CORONARY ANGIOGRAPHY;  Surgeon: Charolette Forward, MD;  Location: [facility];  Service: Cardiovascular;  Laterality: N/A;  . TOTAL HIP ARTHROPLASTY Right 11/06/2010  . TRANSPHENOIDAL / TRANSNASAL HYPOPHYSECTOMY / RESECTION PITUITARY TUMOR  06/13/2012  . TUBAL LIGATION  1974     OB History   No obstetric history on file.     Family History  Problem Relation Age of Onset  . Heart failure Mother   . Hypertension Mother   . Diabetes Father   . Hypertension Sister   . Heart failure Sister   . Hypertension Daughter     Social History   Tobacco Use  . Smoking status: Never Smoker  . Smokeless tobacco: Never Used  Vaping Use  . Vaping Use: Never used  Substance Use Topics  . Alcohol use: Yes    Alcohol/week: 0.0 standard drinks    Comment: rarely; "I don't drink.  But I like it."  . Drug use: No    Home Medications Prior to Admission medications   Medication Sig Start Date End Date Taking? Authorizing Provider  acetaminophen (TYLENOL) 325 MG tablet Take 2 tablets (650 mg total) by mouth every 6 (six) hours as needed (Mild pain). 06/09/20   Angiulli, Lavon Paganini, PA-C  albuterol (VENTOLIN HFA) 108 (90 Base) MCG/ACT inhaler Inhale 2 puffs into the lungs every 6 (six) hours as needed for wheezing. 06/09/20   Angiulli, Lavon Paganini, PA-C  amiodarone (PACERONE) 100 MG tablet Take 1 tablet (100 mg total) by mouth daily. 06/09/20   Angiulli, Lavon Paganini, PA-C  amLODipine (NORVASC) 10 MG tablet Take 1 tablet (10 mg total) by mouth daily. 06/09/20   Angiulli, Lavon Paganini, PA-C  aspirin 81 MG chewable tablet Chew 81 mg by mouth  daily.    [provider]  atorvastatin (LIPITOR) 80 MG tablet Take 1 tablet (80 mg total) by mouth daily. 06/09/20   Angiulli, Lavon Paganini, PA-C  budesonide-formoterol (SYMBICORT) 160-4.5 MCG/ACT inhaler Inhale 2 puffs into the lungs 2 (two) times daily. 06/09/20   Angiulli, Lavon Paganini, PA-C  carvedilol (COREG) 6.25 MG tablet Take 1 tablet (6.25 mg total) by mouth 2 (two) times daily. 06/09/20   Angiulli, Lavon Paganini, PA-C  clopidogrel (PLAVIX) 75 MG tablet Take 1 tablet (75 mg total) by mouth daily. 06/09/20   Angiulli, Lavon Paganini, PA-C  docusate sodium (COLACE) 100 MG capsule Take 1 capsule (100 mg total) by mouth 2 (two) times daily. 06/09/20   Angiulli, Lavon Paganini, PA-C  ferrous sulfate 325 (65 FE)  MG tablet Take 1 tablet (325 mg total) by mouth 3 (three) times daily with meals. 06/09/20   Angiulli, Lavon Paganini, PA-C  furosemide (LASIX) 40 MG tablet Take 1 tablet (40 mg total) by mouth daily at 2 PM. 06/09/20 07/09/20  Angiulli, Lavon Paganini, PA-C  isosorbide mononitrate (IMDUR) 60 MG 24 hr tablet Take 1 tablet (60 mg total) by mouth at bedtime. 06/09/20   Angiulli, Lavon Paganini, PA-C  metFORMIN (GLUCOPHAGE) 500 MG tablet Take 1 tablet (500 mg total) by mouth daily with breakfast. 06/09/20 06/09/21  Angiulli, Lavon Paganini, PA-C  nitroGLYCERIN (NITROSTAT) 0.4 MG SL tablet Place 1 tablet (0.4 mg total) under the tongue every 5 (five) minutes x 3 doses as needed for chest pain. 05/25/20   Charolette Forward, MD  pantoprazole (PROTONIX) 40 MG tablet Take 1 tablet (40 mg total) by mouth at bedtime. 06/09/20   Angiulli, Lavon Paganini, PA-C  polyethylene glycol (MIRALAX / GLYCOLAX) 17 g packet Take 17 g by mouth daily. 06/09/20   Angiulli, Lavon Paganini, PA-C  potassium chloride (KLOR-CON) 10 MEQ tablet Take 2 tablets (20 mEq total) by mouth daily. 06/09/20   Angiulli, Lavon Paganini, PA-C  traMADol (ULTRAM) 50 MG tablet Take 1 tablet (50 mg total) by mouth every 6 (six) hours as needed for moderate pain or severe pain. 06/09/20   Angiulli, Lavon Paganini, PA-C    valsartan (DIOVAN) 160 MG tablet Take 1 tablet (160 mg total) by mouth daily. 06/09/20   Angiulli, Lavon Paganini, PA-C    Allergies    Morphine and related, Penicillins, Percocet [oxycodone-acetaminophen], Ace inhibitors, Iodinated diagnostic agents, Iodine, Etodolac, Tramadol, and Vicodin [hydrocodone-acetaminophen]  Review of Systems   Review of Systems  Constitutional: Negative for fever.  Respiratory: Positive for shortness of breath.   Cardiovascular: Positive for chest pain. Negative for leg swelling.  Gastrointestinal: Negative for abdominal pain.  Musculoskeletal: Negative for back pain.  All other systems reviewed and are negative.   Physical Exam Updated Vital Signs BP (!) 160/87 (BP Location: Right Arm)   Pulse (!) 55   Temp 98.3 F (36.8 C) (Oral)   Resp 18   Ht 5\' 4"  (1.626 m)   Wt 108.9 kg   SpO2 98%   BMI 41.20 kg/m   Physical Exam Vitals and nursing note reviewed.  Constitutional:      Appearance: She is well-developed. She is obese.  HENT:     Head: Normocephalic and atraumatic.     Right Ear: External ear normal.     Left Ear: External ear normal.     Nose: Nose normal.  Eyes:     General:        Right eye: No discharge.        Left eye: No discharge.  Cardiovascular:     Rate and Rhythm: Regular rhythm. Bradycardia present.     Heart sounds: Normal heart sounds.  Pulmonary:     Effort: Pulmonary effort is normal.     Breath sounds: Normal breath sounds.  Chest:     Chest wall: Tenderness (mild, mid-chest) present.  Abdominal:     Palpations: Abdomen is soft.     Tenderness: There is no abdominal tenderness.  Skin:    General: Skin is warm and dry.  Neurological:     Mental Status: She is alert.  Psychiatric:        Mood and Affect: Mood is not anxious.     ED Results / Procedures / Treatments   Labs (all labs ordered  are listed, but only abnormal results are displayed) Labs Reviewed  BASIC METABOLIC PANEL - Abnormal; Notable for the  following components:      Result Value   Glucose, Bld 109 (*)    All other components within normal limits  CBC  TROPONIN I (HIGH SENSITIVITY)  TROPONIN I (HIGH SENSITIVITY)    EKG EKG Interpretation  Date/Time:  Monday August 08 2020 12:14:32 EDT Ventricular Rate:  56 PR Interval:    QRS Duration: 95 QT Interval:  439 QTC Calculation: 424 R Axis:   33 Text Interpretation: Sinus rhythm Borderline prolonged PR interval Borderline low voltage, extremity leads Anteroseptal infarct, old Abnormal T, consider ischemia, lateral leads Confirmed by Sherwood Gambler [phone]) on 08/08/2020 12:23:35 PM   Radiology DG Chest 2 View  Result Date: 08/08/2020 CLINICAL DATA:  Chest pain. EXAM: CHEST - 2 VIEW COMPARISON:  May 30, 2020. FINDINGS: The heart size and mediastinal contours are within normal limits. Both lungs are clear. No pneumothorax or pleural effusion is noted. The visualized skeletal structures are unremarkable. IMPRESSION: No active cardiopulmonary disease. Electronically Signed   By: Marijo Conception M.D.   On: 08/08/2020 13:18    Procedures Procedures (including critical care time)  Medications Ordered in ED Medications  nitroGLYCERIN (NITROSTAT) SL tablet 0.4 mg (has no administration in time range)    ED Course  I have reviewed the triage vital signs and the nursing notes.  Pertinent labs & imaging results that were available during my care of the patient were reviewed by me and considered in my medical decision making (see chart for details).    MDM Rules/Calculators/A&P                          Patient's chest pain resolved while in the emergency department.  She feels good at this time.  She does have coronary disease but is having a little bit of atypical chest pain currently.  ECG seems to be baseline to me and x-ray and labs are overall reassuring.  I discussed with Dr. Terrence Dupont, who advises second troponin and if negative would be stable for outpatient  follow-up in his clinic next week.  Patient prefers this plan rather than coming in.  Doubt PE or dissection.  Will discharge with return precautions. Final Clinical Impression(s) / ED Diagnoses Final diagnoses:  Nonspecific chest pain    Rx / DC Orders ED Discharge Orders    None       Sherwood Gambler, MD 08/08/20 1559

## 2020-08-08 NOTE — Discharge Instructions (Signed)
If you develop recurrent, continued, or worsening chest pain, shortness of breath, fever, vomiting, abdominal or back pain, or any other new/concerning symptoms then return to the ER for evaluation.  

## 2020-08-08 NOTE — ED Triage Notes (Signed)
Patient arrives to ED with EMS from PCP with complaints of chest pain x3 days. Pain is substernal and intermittent at first, yesterday pain started to be constant. Pain is described as pressure. No relieved by OTC medication. Pt took x2 nitro before arrival which improved CP from 10/10 to 4/10.

## 2020-08-16 ENCOUNTER — Institutional Professional Consult (permissible substitution): Payer: Medicare HMO | Admitting: Neurology

## 2020-08-23 DIAGNOSIS — J441 Chronic obstructive pulmonary disease with (acute) exacerbation: Secondary | ICD-10-CM | POA: Diagnosis not present

## 2020-08-25 ENCOUNTER — Encounter: Payer: Self-pay | Admitting: Neurology

## 2020-08-25 ENCOUNTER — Telehealth: Payer: Self-pay | Admitting: Neurology

## 2020-08-25 ENCOUNTER — Institutional Professional Consult (permissible substitution): Payer: Medicare HMO | Admitting: Neurology

## 2020-08-25 NOTE — Telephone Encounter (Signed)
This is a second new pt NS for sleep consult. Please do not schedule again with me.

## 2020-08-30 ENCOUNTER — Encounter: Payer: Self-pay | Admitting: Neurology

## 2020-09-20 DIAGNOSIS — E1149 Type 2 diabetes mellitus with other diabetic neurological complication: Secondary | ICD-10-CM | POA: Diagnosis not present

## 2020-09-20 DIAGNOSIS — Z7984 Long term (current) use of oral hypoglycemic drugs: Secondary | ICD-10-CM | POA: Diagnosis not present

## 2020-09-20 DIAGNOSIS — Z9189 Other specified personal risk factors, not elsewhere classified: Secondary | ICD-10-CM | POA: Diagnosis not present

## 2020-09-20 DIAGNOSIS — I10 Essential (primary) hypertension: Secondary | ICD-10-CM | POA: Diagnosis not present

## 2020-09-22 DIAGNOSIS — J441 Chronic obstructive pulmonary disease with (acute) exacerbation: Secondary | ICD-10-CM | POA: Diagnosis not present

## 2020-10-21 ENCOUNTER — Encounter: Payer: Self-pay | Admitting: Cardiovascular Disease

## 2020-10-23 DIAGNOSIS — J441 Chronic obstructive pulmonary disease with (acute) exacerbation: Secondary | ICD-10-CM | POA: Diagnosis not present

## 2020-10-25 DIAGNOSIS — E119 Type 2 diabetes mellitus without complications: Secondary | ICD-10-CM | POA: Diagnosis not present

## 2020-10-25 DIAGNOSIS — I2510 Atherosclerotic heart disease of native coronary artery without angina pectoris: Secondary | ICD-10-CM | POA: Diagnosis not present

## 2020-10-25 DIAGNOSIS — S301XXD Contusion of abdominal wall, subsequent encounter: Secondary | ICD-10-CM | POA: Diagnosis not present

## 2020-10-25 DIAGNOSIS — E785 Hyperlipidemia, unspecified: Secondary | ICD-10-CM | POA: Diagnosis not present

## 2020-10-25 DIAGNOSIS — I10 Essential (primary) hypertension: Secondary | ICD-10-CM | POA: Diagnosis not present

## 2020-10-25 DIAGNOSIS — M1990 Unspecified osteoarthritis, unspecified site: Secondary | ICD-10-CM | POA: Diagnosis not present

## 2020-11-01 ENCOUNTER — Ambulatory Visit: Payer: Medicare HMO | Admitting: Adult Health

## 2020-11-22 DIAGNOSIS — E785 Hyperlipidemia, unspecified: Secondary | ICD-10-CM | POA: Diagnosis not present

## 2020-11-22 DIAGNOSIS — E119 Type 2 diabetes mellitus without complications: Secondary | ICD-10-CM | POA: Diagnosis not present

## 2020-11-22 DIAGNOSIS — I10 Essential (primary) hypertension: Secondary | ICD-10-CM | POA: Diagnosis not present

## 2020-11-22 DIAGNOSIS — I2510 Atherosclerotic heart disease of native coronary artery without angina pectoris: Secondary | ICD-10-CM | POA: Diagnosis not present

## 2020-11-23 DIAGNOSIS — J441 Chronic obstructive pulmonary disease with (acute) exacerbation: Secondary | ICD-10-CM | POA: Diagnosis not present

## 2020-12-21 DIAGNOSIS — J441 Chronic obstructive pulmonary disease with (acute) exacerbation: Secondary | ICD-10-CM | POA: Diagnosis not present

## 2021-01-21 DIAGNOSIS — J441 Chronic obstructive pulmonary disease with (acute) exacerbation: Secondary | ICD-10-CM | POA: Diagnosis not present

## 2021-02-20 DIAGNOSIS — J441 Chronic obstructive pulmonary disease with (acute) exacerbation: Secondary | ICD-10-CM | POA: Diagnosis not present

## 2021-03-02 DIAGNOSIS — M1990 Unspecified osteoarthritis, unspecified site: Secondary | ICD-10-CM | POA: Diagnosis not present

## 2021-03-02 DIAGNOSIS — I2510 Atherosclerotic heart disease of native coronary artery without angina pectoris: Secondary | ICD-10-CM | POA: Diagnosis not present

## 2021-03-02 DIAGNOSIS — I10 Essential (primary) hypertension: Secondary | ICD-10-CM | POA: Diagnosis not present

## 2021-03-02 DIAGNOSIS — E785 Hyperlipidemia, unspecified: Secondary | ICD-10-CM | POA: Diagnosis not present

## 2021-03-02 DIAGNOSIS — E119 Type 2 diabetes mellitus without complications: Secondary | ICD-10-CM | POA: Diagnosis not present

## 2021-03-23 DIAGNOSIS — J441 Chronic obstructive pulmonary disease with (acute) exacerbation: Secondary | ICD-10-CM | POA: Diagnosis not present

## 2021-04-22 DIAGNOSIS — J441 Chronic obstructive pulmonary disease with (acute) exacerbation: Secondary | ICD-10-CM | POA: Diagnosis not present

## 2021-05-01 DIAGNOSIS — I10 Essential (primary) hypertension: Secondary | ICD-10-CM | POA: Diagnosis not present

## 2021-05-01 DIAGNOSIS — R0789 Other chest pain: Secondary | ICD-10-CM | POA: Diagnosis not present

## 2021-05-01 DIAGNOSIS — E785 Hyperlipidemia, unspecified: Secondary | ICD-10-CM | POA: Diagnosis not present

## 2021-05-01 DIAGNOSIS — E119 Type 2 diabetes mellitus without complications: Secondary | ICD-10-CM | POA: Diagnosis not present

## 2021-05-01 DIAGNOSIS — I25118 Atherosclerotic heart disease of native coronary artery with other forms of angina pectoris: Secondary | ICD-10-CM | POA: Diagnosis not present

## 2021-05-23 DIAGNOSIS — J441 Chronic obstructive pulmonary disease with (acute) exacerbation: Secondary | ICD-10-CM | POA: Diagnosis not present

## 2021-06-23 DIAGNOSIS — J441 Chronic obstructive pulmonary disease with (acute) exacerbation: Secondary | ICD-10-CM | POA: Diagnosis not present

## 2021-06-26 IMAGING — DX DG CHEST 1V PORT
1 series · 1 of 1 positions shown · non-contrast
Comparison: Chest CT 07/13/2019, chest radiograph 07/13/2019

CLINICAL DATA: Chest pain.

EXAM:
PORTABLE CHEST 1 VIEW

[chest ap]
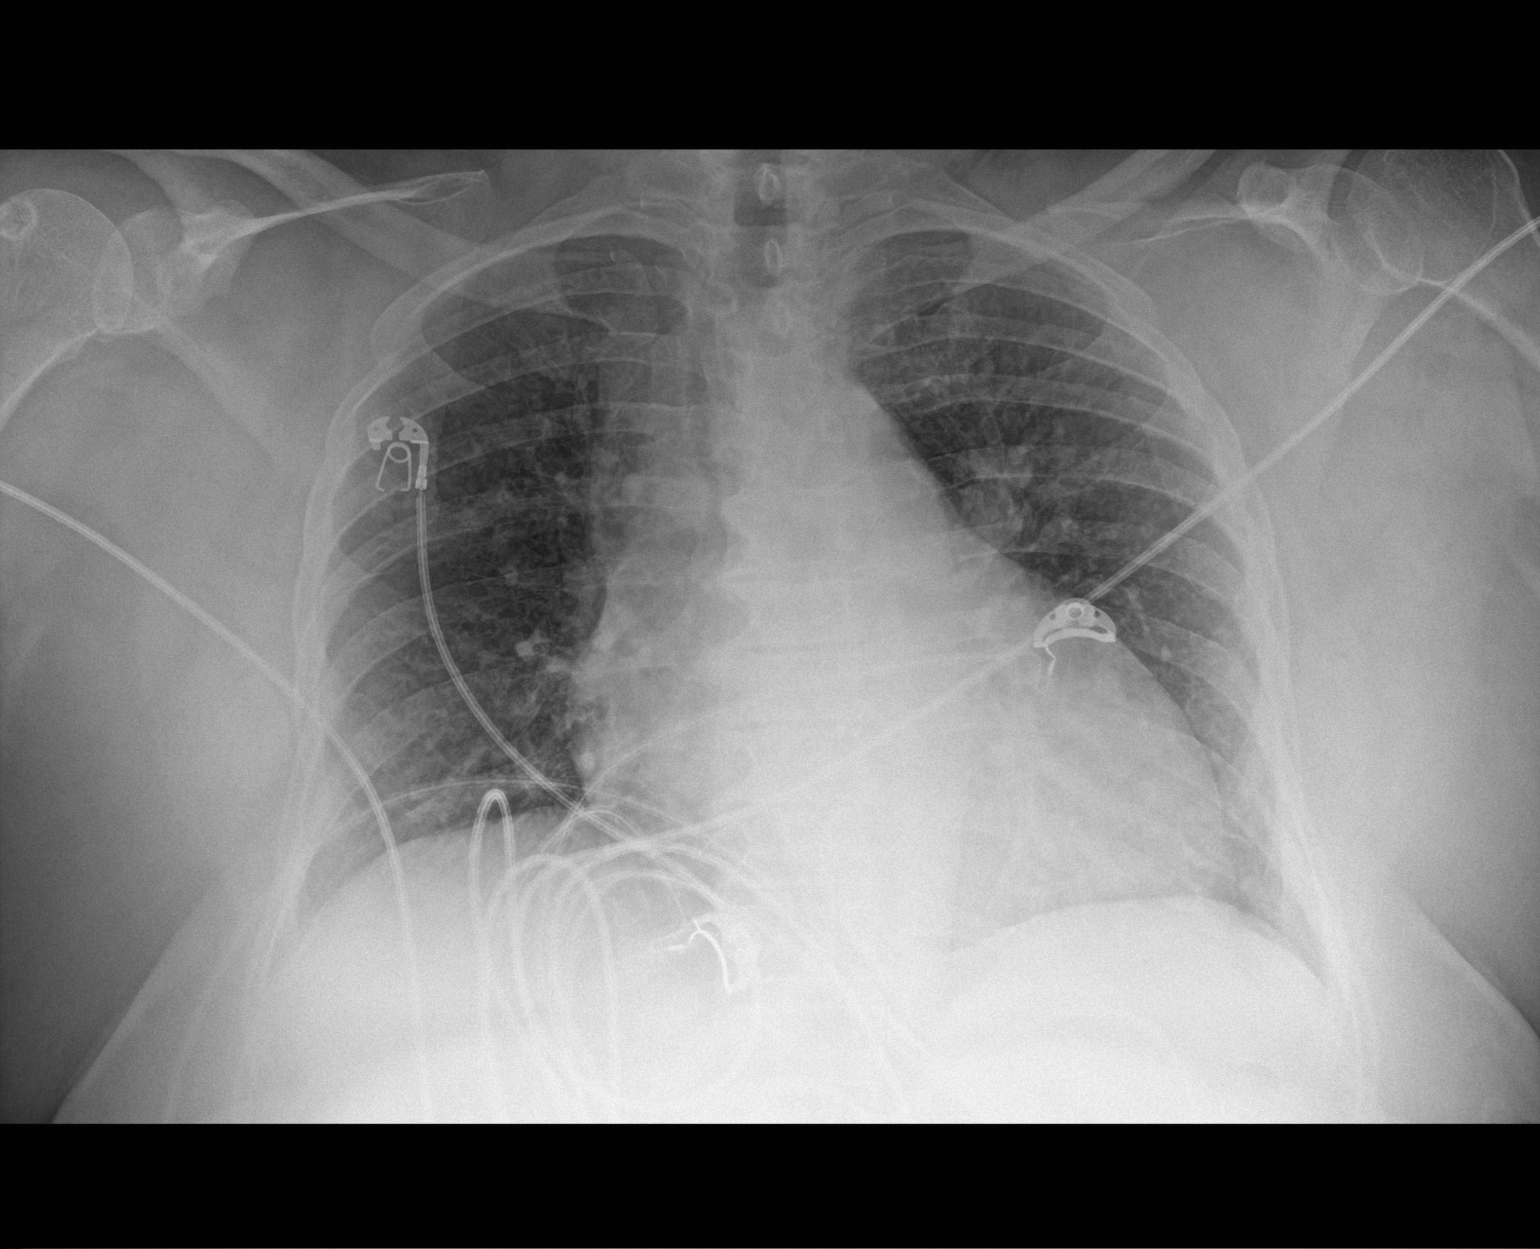

[1 of 1 positions shown; findings below may reference images not displayed]

FINDINGS: Unchanged cardiomegaly. No airspace consolidation. Mild diffuse
pulmonary interstitial prominence is unchanged. No evidence of
pleural effusion or pneumothorax. No acute bony abnormality.
Thoracic spondylosis with multilevel anterolateral osteophytes.
Overlying cardiac monitoring leads.
IMPRESSION: Unchanged cardiomegaly.

Redemonstrated mild diffuse pulmonary interstitial prominence which
may reflect interstitial edema.

No airspace consolidation.

## 2021-07-23 DIAGNOSIS — J441 Chronic obstructive pulmonary disease with (acute) exacerbation: Secondary | ICD-10-CM | POA: Diagnosis not present

## 2021-08-23 DIAGNOSIS — J441 Chronic obstructive pulmonary disease with (acute) exacerbation: Secondary | ICD-10-CM | POA: Diagnosis not present

## 2022-01-24 IMAGING — MR MR HEAD W/O CM
11 of 13 series · 39 of 48 positions shown · non-contrast
Comparison: Prior head CT from 05/27/2020.

CLINICAL DATA: Initial evaluation for acute stroke.



[Series 6: DWI · axial · 3.0mm · 0.88mm/px · z∈[-125,+15]mm · 8 of 96 slices shown (1 of 4)]
[im 1/96]
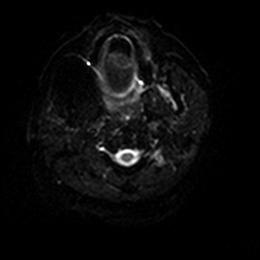
[im 14/96]
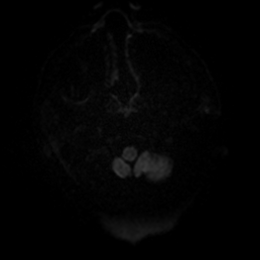
[im 28/96]
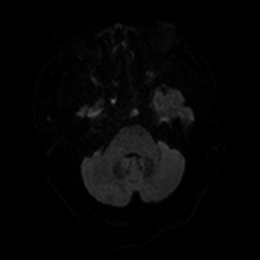
[im 41/96]
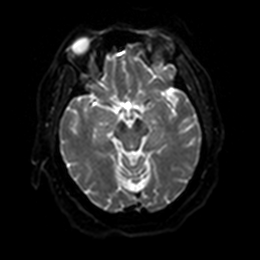
[im 55/96]
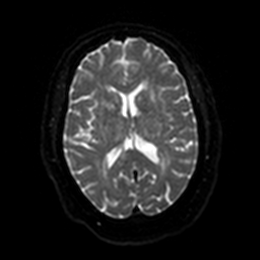
[im 68/96]
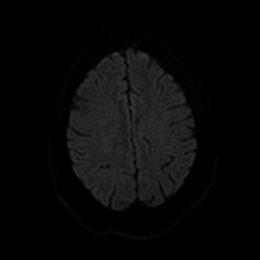
[im 82/96]
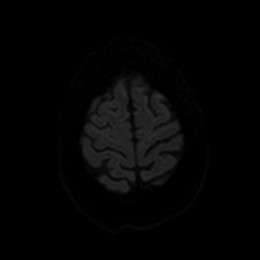
[im 96/96]
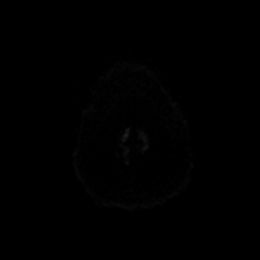

[Series 7: DWI · axial · 3.0mm · 0.88mm/px · z∈[-125,+15]mm · 4 of 48 slices shown (2 of 4)]
[im 1/48]
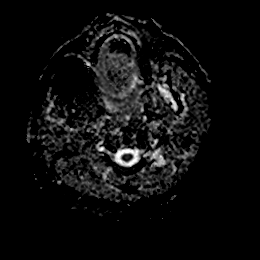
[im 16/48]
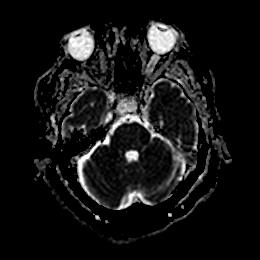
[im 32/48]
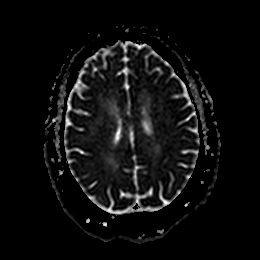
[im 48/48]
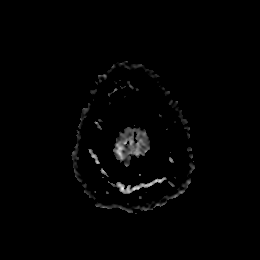

[Series 8: DWI · coronal · 4.0mm · 0.88mm/px · 5 of 66 slices shown (3 of 4)]
[im 1/66]
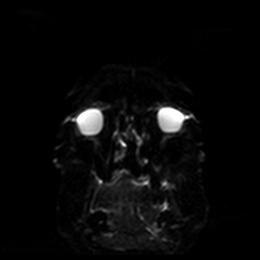
[im 17/66]
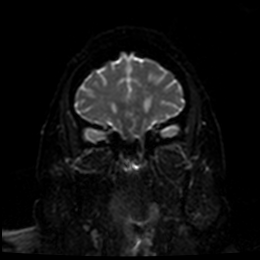
[im 33/66]
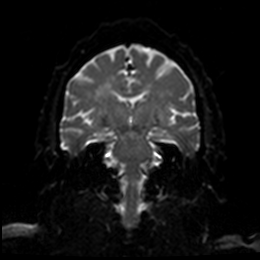
[im 49/66]
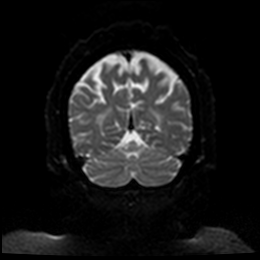
[im 66/66]
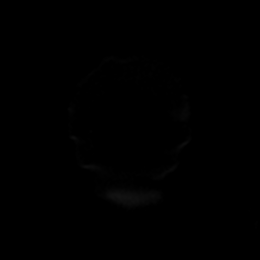

[Series 9: DWI · coronal · 4.0mm · 0.88mm/px · 3 of 33 slices shown (4 of 4)]
[im 1/33]
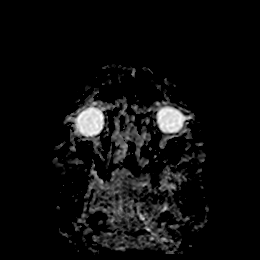
[im 17/33]
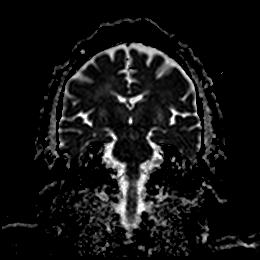
[im 33/33]
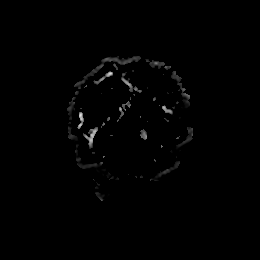

[Series 10: T1 · sagittal · 5.0mm · 0.75mm/px · 2 of 25 slices shown]
[im 1/25]
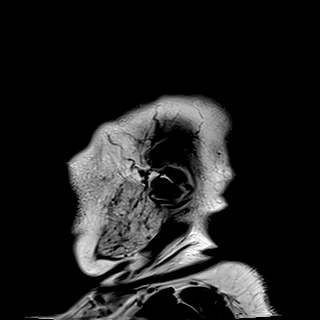
[im 25/25]
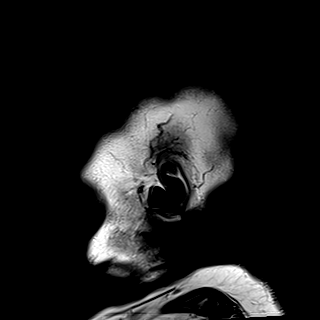

[Series 11: T2 · axial · 5.0mm · 0.72mm/px · z∈[-134,+22]mm · 2 of 27 slices shown (1 of 2)]
[im 1/27]
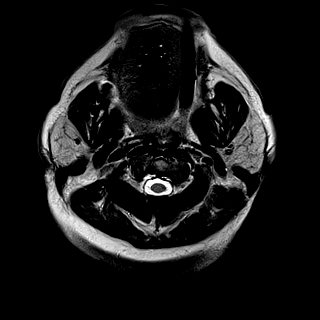
[im 27/27]
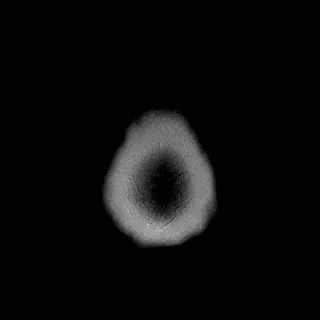

[Series 12: FLAIR · axial · 5.0mm · 0.45mm/px · z∈[-134,+22]mm · 2 of 27 slices shown]
[im 1/27]
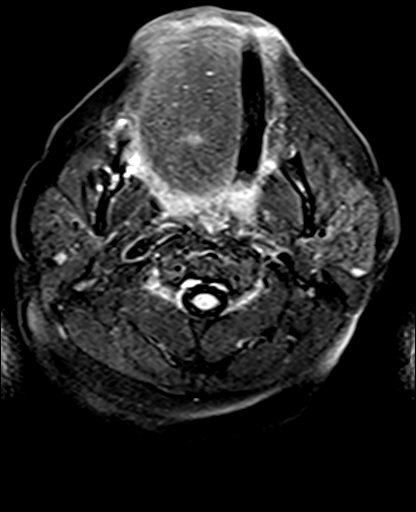
[im 27/27]
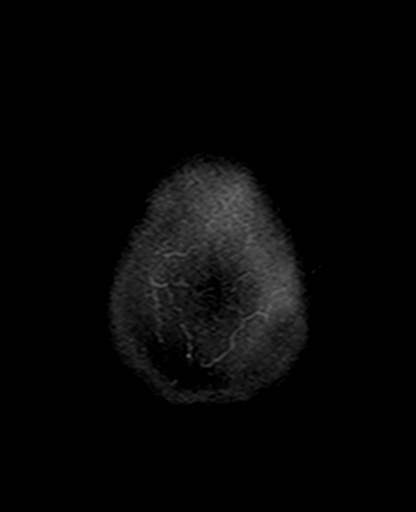

[Series 13: mag_images · axial · 3.0mm · 0.90mm/px · z∈[-138,+27]mm · 4 of 56 slices shown]
[im 1/56]
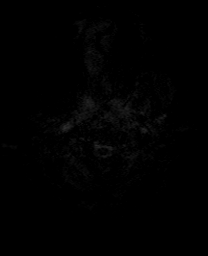
[im 19/56]
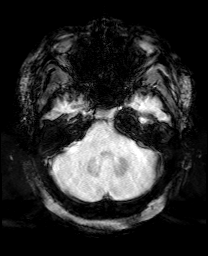
[im 37/56]
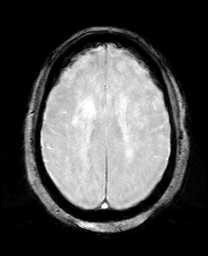
[im 56/56]
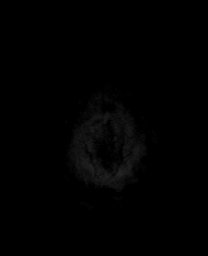

[Series 14: pha_images · axial · 3.0mm · 0.90mm/px · z∈[-138,+27]mm · 4 of 56 slices shown]
[im 1/56]
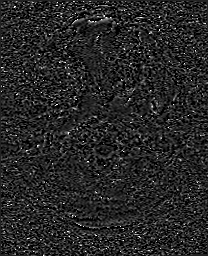
[im 19/56]
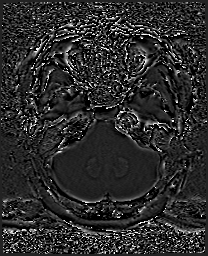
[im 37/56]
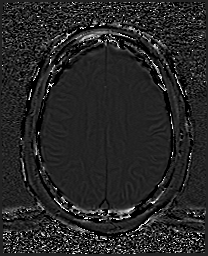
[im 56/56]
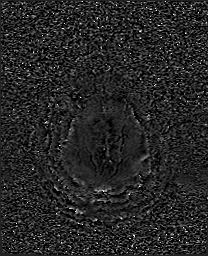

[Series 15: swi_images · axial · 3.0mm · 0.90mm/px · z∈[-138,-30]mm · 3 of 56 slices shown]
[im 1/56]
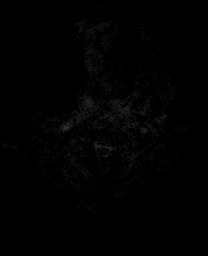
[im 19/56]
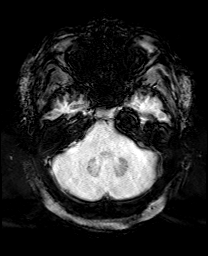
[im 37/56]
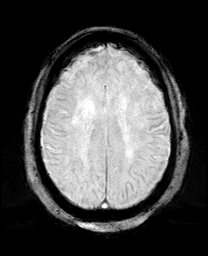

[Series 18: T2 · coronal · 5.0mm · 0.34mm/px · 2 of 29 slices shown (2 of 2)]
[im 1/29]
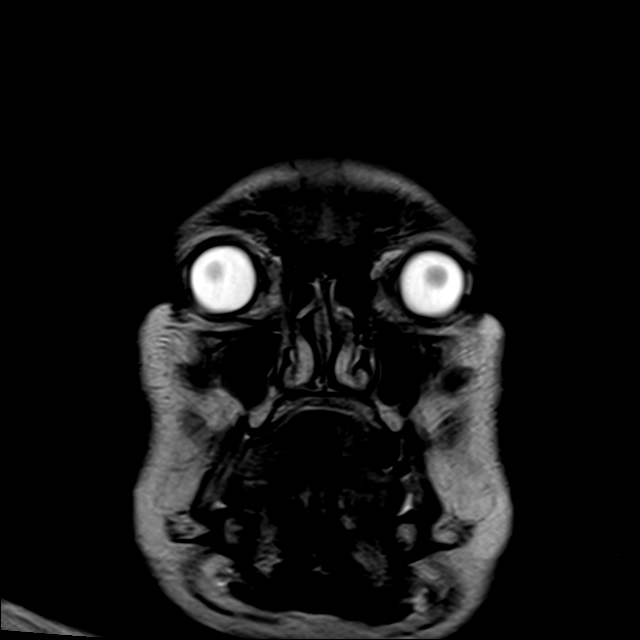
[im 29/29]
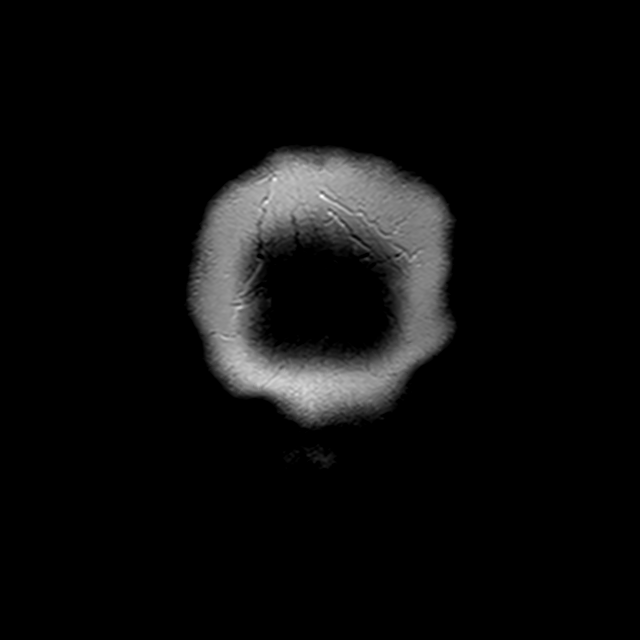

[39 of 48 positions shown; findings below may reference images not displayed]

FINDINGS: MRI HEAD FINDINGS

Brain: Cerebral volume within normal limits for age. Patchy T2/FLAIR
hyperintensity seen within the periventricular and deep white matter
of both cerebral hemispheres, most consistent with chronic small
vessel ischemic disease, moderate in nature. Few scattered
superimposed remote lacunar infarcts noted about the basal ganglia
and hemispheric cerebral white matter.

12 mm area of restricted diffusion seen involving the right
posterior splenium (series 6, image 76). Few additional punctate
acute ischemic infarcts seen within the right cerebral white matter
(series 6, images 78, 76). Probable additional small ischemic
infarct involving the right hippocampus (series 6, image 71).
Punctate left cerebellar infarct noted as well (series 6, image 61).
No associated hemorrhage or mass effect. Gray-white matter
differentiation otherwise maintained. No encephalomalacia to suggest
chronic cortical infarction elsewhere within the brain. No evidence
for acute or chronic intracranial hemorrhage.

No mass lesion, midline shift or mass effect. No hydrocephalus or
extra-axial fluid collection. Pituitary gland suprasellar region
normal. Midline structures intact.

Vascular: Major intracranial vascular flow voids are maintained.

Skull and upper cervical spine: Craniocervical junction within
normal limits. Bone marrow signal intensity normal. No scalp soft
tissue abnormality.

Sinuses/Orbits: Globes and orbital soft tissues within normal
limits. Moderate layering fluid noted within the dominant right
sphenoid sinus. Layering fluid noted within the nasopharynx as well.
Small bilateral mastoid effusions. Patient is intubated.

Other: None.

MRA HEAD FINDINGS

ANTERIOR CIRCULATION:

Examination somewhat degraded by motion artifact.

Visualized distal cervical segments of the internal carotid arteries
are both patent with antegrade flow. Petrous segments widely patent.
Scattered atheromatous change throughout the carotid siphons with
associated moderate to severe multifocal stenoses, most pronounced
at the supraclinoid right ICA (series 7, image 94). Origin of the
ophthalmic arteries not well seen on this exam. ICA termini
perfused. Severe bilateral proximal A1 stenoses noted (series 7922,
image 11). Normal anterior communicating artery complex. Moderate
atheromatous irregularity throughout the ACAs which remain patent to
their distal aspects. No M1 stenosis or occlusion. Grossly normal
MCA bifurcations. Severe proximal left M2 stenosis, inferior
division (series 7922, image 10). No proximal MCA branch occlusion.
Additional moderate to advance atheromatous irregularity throughout
the MCA branches bilaterally.

POSTERIOR CIRCULATION:

Vertebral arteries widely patent as they course into the cranial
vault. Right vertebral widely patent to the takeoff of the right
PICA. Right PICA patent. Distally, there is moderate to severe
narrowing of the distal right V4 segment (series 7, image 39). On
the left, the left PICA takes off early and appears to be grossly
patent. Short-segment severe proximal left V4 stenosis noted (series
7, image 13). Left vertebral irregular distally but remains patent
to the vertebrobasilar junction. Atheromatous irregularity
throughout the basilar artery without high-grade stenosis. Left SCA
widely patent. Severe stenosis at the origin of the right SCA
(series 1911, image 12). Both PCAs primarily supplied via the
basilar. Multifocal moderate to severe bilateral P2 stenoses noted.
PCAs remain patent to their distal aspects.

No intracranial aneurysm.

MRA NECK FINDINGS

AORTIC ARCH: Examination technically limited by lack of IV contrast.

Visualized aortic arch within normal limits for caliber. Bovine
branching pattern with common origin of the left common and
brachiocephalic arteries noted. No hemodynamically significant
stenosis seen about the origin of the great vessels.

RIGHT CAROTID SYSTEM: Right CCA patent to the bifurcation without
stenosis. Atheromatous irregularity at the origin of the right ICA
with mild stenosis of approximately 30-40% by NASCET criteria. Right
ICA mildly tortuous but patent distally to the skull base without
stenosis, dissection, or occlusion.

LEFT CAROTID SYSTEM: Left CCA patent from its origin to the
bifurcation without stenosis. Atheromatous change at the origin of
the left ICA with estimated 40% stenosis by NASCET criteria. Left
ICA mildly tortuous but otherwise patent distally to the skull base
without stenosis, dissection or occlusion.

VERTEBRAL ARTERIES: Both vertebral arteries appear to arise from the
subclavian arteries. Origins of the vertebral arteries not well
assessed on this examination. Visualized vertebral arteries widely
patent with symmetric antegrade flow, with no evidence for
dissection or occlusion.
IMPRESSION: MRI HEAD IMPRESSION:

1. Patchy small volume acute ischemic infarcts involving the right
splenium and right cerebral white matter as above, with additional
punctate acute ischemic left cerebellar infarct. No associated
hemorrhage or mass effect.
2. Underlying age-related cerebral atrophy with moderate chronic
microvascular ischemic disease.

MRA HEAD IMPRESSION:

1. Motion degraded exam.
2. Negative MRA for large vessel occlusion.
3. Extensive intracranial atherosclerotic disease with multifocal
moderate to severe stenoses involving both the anterior and
posterior circulation as detailed above.

MRA NECK IMPRESSION:

1. Approximate 30-40% stenoses about the origins of both internal
carotid arteries, left slightly worse than right. No other
hemodynamically significant stenosis or other abnormality about
either carotid artery system within the neck.
2. Wide patency of both vertebral arteries within the neck.

## 2022-06-25 DIAGNOSIS — E1149 Type 2 diabetes mellitus with other diabetic neurological complication: Secondary | ICD-10-CM | POA: Diagnosis not present

## 2022-06-25 DIAGNOSIS — Z0000 Encounter for general adult medical examination without abnormal findings: Secondary | ICD-10-CM | POA: Diagnosis not present

## 2022-06-25 DIAGNOSIS — R0683 Snoring: Secondary | ICD-10-CM | POA: Diagnosis not present

## 2022-06-25 DIAGNOSIS — I5032 Chronic diastolic (congestive) heart failure: Secondary | ICD-10-CM | POA: Diagnosis not present

## 2022-06-25 DIAGNOSIS — R413 Other amnesia: Secondary | ICD-10-CM | POA: Diagnosis not present

## 2022-06-25 DIAGNOSIS — E7800 Pure hypercholesterolemia, unspecified: Secondary | ICD-10-CM | POA: Diagnosis not present

## 2022-06-25 DIAGNOSIS — Z23 Encounter for immunization: Secondary | ICD-10-CM | POA: Diagnosis not present

## 2022-07-16 ENCOUNTER — Other Ambulatory Visit (INDEPENDENT_AMBULATORY_CARE_PROVIDER_SITE_OTHER): Payer: Medicare Other

## 2022-07-16 ENCOUNTER — Encounter: Payer: Self-pay | Admitting: Physician Assistant

## 2022-07-16 ENCOUNTER — Ambulatory Visit (INDEPENDENT_AMBULATORY_CARE_PROVIDER_SITE_OTHER): Payer: Medicare Other | Admitting: Physician Assistant

## 2022-07-16 VITALS — BP 213/108 | HR 69 | Resp 18 | Ht 63.0 in | Wt 250.0 lb

## 2022-07-16 DIAGNOSIS — G3184 Mild cognitive impairment, so stated: Secondary | ICD-10-CM

## 2022-07-16 DIAGNOSIS — R413 Other amnesia: Secondary | ICD-10-CM

## 2022-07-16 DIAGNOSIS — I10 Essential (primary) hypertension: Secondary | ICD-10-CM | POA: Diagnosis not present

## 2022-07-16 DIAGNOSIS — I25118 Atherosclerotic heart disease of native coronary artery with other forms of angina pectoris: Secondary | ICD-10-CM | POA: Diagnosis not present

## 2022-07-16 DIAGNOSIS — E119 Type 2 diabetes mellitus without complications: Secondary | ICD-10-CM | POA: Diagnosis not present

## 2022-07-16 DIAGNOSIS — E785 Hyperlipidemia, unspecified: Secondary | ICD-10-CM | POA: Diagnosis not present

## 2022-07-16 HISTORY — DX: Mild cognitive impairment of uncertain or unknown etiology: G31.84

## 2022-07-16 LAB — VITAMIN B12: Vitamin B-12: 415 pg/mL (ref 211–911)

## 2022-07-16 LAB — TSH: TSH: 7.38 u[IU]/mL — ABNORMAL HIGH (ref 0.35–5.50)

## 2022-07-16 MED ORDER — MEMANTINE HCL 5 MG PO TABS: ORAL_TABLET | ORAL | 11 refills | Status: DC

## 2022-07-16 NOTE — Progress Notes (Addendum)
Assessment/Plan:     The patient is seen in neurologic consultation at the request of Glenis Smoker, * for the evaluation of memory.  Sherry Fitzpatrick is a very pleasant 71 y.o. year old RH female with  a history of hypertension, dCHF, DM2,  CAD s/p NSTEMI, prior CVA with chronic dysphagia on Plavix ( 06/2020 involving the L cerebellar artery), PAF, hyperlipidemia, COPD, CKD3, OSA not on CPAP, arthritis, seen today for evaluation of memory loss. MoCA today is 21/30. Findings are suspicious for Mild Cognitive Impairment due to Vascular etiology. Prior MRI 2021 personally reviewed showed Patchy small volume acute ischemic infarcts involving the right splenium and right cerebral white matter as above, with additional punctate acute ischemic left cerebellar infarct. Underlying age-related cerebral atrophy with moderate chronic microvascular ischemic disease was noted. MRA head and neck showed extensive intracranial atherosclerotic disease with multifocal moderate to severe stenoses involving both the anterior and posterior circulation and mild carotid artery disease. MRA Neck - Approximate 30-40% stenoses about the origins of both internal carotid arteries, left slightly worse than right. She is not compliant with her meds and today her BP is quite elevated at 213/108. She has been instructed to take her meds asap and if continuing to be elevated may need ED treatment.  She has high risk for stroke.       Recommendations:   Dementia due vascular disease  MRI brain without contrast to assess for underlying structural abnormality and assess vascular load  Check B12, TSH Recommend good control of cardiovascular risk factors. Needs to be compliant with her meds due to high risk for stroke Follow up with Cards  Continue to control mood as per PCP Start Memantine '5mg'$  tablets.  Take 1 tablet at bedtime for 2 weeks, then 1 tablet twice daily. Side effects discussed Folllow up in 3 months    Subjective:     The patient is accompanied by her daughter who supplements the history.   How long did patient have memory difficulties?   At least since for the last 5 years , worse after her MI in 2021. SHe may forget recent conversations or follow instructions. LTMis good.  Patient lives with: daughter repeats oneself?  Her daughter began repeating herself, asking the same questions, progressively worse  Disoriented when walking into a room?  Patient denies   Leaving objects in unusual places?  Patient denies   Ambulates  with difficulty?  She uses a walker due to arthritis of the hips Recent falls?  Patient denies   Any head injuries?  Patient denies   History of seizures?   Patient denies   Wandering behavior?  Patient denies   Patient drives?   Sometimes. She does not drive too much, a couple of times she got lost and did not know where she was, so her daughter does most of the driving Any mood changes such irritability agitation?  Patient denies   Any history of depression?:  Patient denies, but daughter feels that she is depressed  Hallucinations?  Patient denies   Paranoia?  Patient denies   Patient reports that he sleeps well without vivid dreams, REM behavior or sleepwalking    She wakes up intermittently.  History of sleep apnea?  Diagnosed on 2016, "never got he  CPAP" but she snores and she is scheduled for a sleep study as per PCP Any hygiene concerns?  Theres are 3 days without showering,  "too tiring"  Independent of bathing and dressing?  Endorsed  Does the patient needs help with medications?  Daughter in charge since 2021 "so many of them I don't want to take them". She has not taken her BP med this morning Who is in charge of the finances?  Daughter is in charge since 2021  Any changes in appetite?  Patient denies . Craves for sweets  Patient have trouble swallowing? Patient denies   Does the patient cook?  Patient denies   Any kitchen accidents such as leaving  the stove on? Endorsed, last week she left food on the stove Any headaches?  When BP is elevated she has headaches "on top of the head"  Double vision? Denies   Any focal numbness or tingling?  Patient denies   Chronic back pain Patient denies   Unilateral weakness?  Patient denies   Any tremors?  Patient denies   Any history of anosmia?  Patient denies   Any incontinence of urine?  Patient denies   Any bowel dysfunction?   Patient denies   History of heavy alcohol intake?  Patient denies   History of heavy tobacco use?  D/c'd in 2020 Family history of dementia?  Fa, [facility] Sisters possible vascular dementia   Pertinent labs 06/2022: LDL 117,   Stroke workup 2021Dr Lidnzen   Stroke: Patchy small volume acute ischemic infarcts involving the right splenium and right cerebral white matter, right hippocampus with additional punctate acute ischemic left cerebellar infarct - likely embolic from new atrial fibrillation versus post STEMI procedure Resultant right hemiparesis    CT head - No acute intracranial pathology. Mild chronic microvascular ischemic changes. MRI head - Patchy small volume acute ischemic infarcts involving the right splenium and right cerebral white matter as above, with additional punctate acute ischemic left cerebellar infarct. Underlying age-related cerebral atrophy with moderate chronic microvascular ischemic disease.  MRA head - Extensive intracranial atherosclerotic disease with multifocal moderate to severe stenoses involving both the anterior and posterior circulation. MRA Neck - Approximate 30-40% stenoses about the origins of both internal carotid arteries, left slightly worse than right CTA H&N - not ordered CT Perfusion - not ordered Carotid Doppler - MRA neck ordered - carotid dopplers not indicated. EEG - This study is suggestive of severe diffuse encephalopathy, nonspecific etiology but likely related to sedation. No seizures or epileptiform discharges were seen  throughout the recording.  2D Echo - 03/28/20 - EF 60 - 65%. No cardiac source of emboli identified.  [employer] Virus 2 - negative LDL - 145 HgbA1c - 7.3 UDS - not ordered VTE prophylaxis - SCDs Aspirin 81 mg and Plavix 75 mg daily prior to admission, now on Aspirin 81 mg and Plavix 75 mg daily Patient counseled to be compliant with her antithrombotic medications Ongoing aggressive stroke risk factor management   Allergies  Allergen Reactions   Morphine And Related Hives and Nausea And Vomiting   Penicillins Hives    Has patient had a PCN reaction causing immediate rash, facial/tongue/throat swelling, SOB or lightheadedness with hypotension: Yes Has patient had a PCN reaction causing severe rash involving mucus membranes or skin necrosis: No Has patient had a PCN reaction that required hospitalization: No Has patient had a PCN reaction occurring within the last 10 years: No If all of the above answers are "NO", then may proceed with Cephalosporin use.   Percocet [Oxycodone-Acetaminophen] Nausea And Vomiting   Ace Inhibitors Nausea And Vomiting and Cough   Iodinated Contrast Media Nausea And Vomiting  Iodine Itching   Etodolac Nausea And Vomiting   Tramadol Nausea And Vomiting   Vicodin [Hydrocodone-Acetaminophen] Nausea And Vomiting    Current Outpatient Medications  Medication Instructions   acetaminophen (TYLENOL) 650 mg, Oral, Every 6 hours PRN   albuterol (VENTOLIN HFA) 108 (90 Base) MCG/ACT inhaler 2 puffs, Inhalation, Every 6 hours PRN   amiodarone (PACERONE) 100 mg, Oral, Daily   amLODipine (NORVASC) 10 mg, Oral, Daily   aspirin 81 mg, Oral, Daily   atorvastatin (LIPITOR) 80 mg, Oral, Daily   budesonide-formoterol (SYMBICORT) 160-4.5 MCG/ACT inhaler 2 puffs, Inhalation, 2 times daily   carvedilol (COREG) 6.25 mg, Oral, 2 times daily   clopidogrel (PLAVIX) 75 mg, Oral, Daily   docusate sodium (COLACE) 100 mg, Oral, 2 times daily   ferrous sulfate 325 mg, Oral,  3 times daily with meals   furosemide (LASIX) 40 mg, Oral, Daily   isosorbide mononitrate (IMDUR) 60 mg, Oral, Daily at bedtime   memantine (NAMENDA) 5 MG tablet Take 1 tablet (5 mg at night) for 2 weeks, then increase to 1 tablet (5 mg) twice a day   metFORMIN (GLUCOPHAGE) 500 mg, Oral, Daily with breakfast   nitroGLYCERIN (NITROSTAT) 0.4 mg, Sublingual, Every 5 min x3 PRN   pantoprazole (PROTONIX) 40 mg, Oral, Daily at bedtime   polyethylene glycol (MIRALAX / GLYCOLAX) 17 g, Oral, Daily   potassium chloride (KLOR-CON) 10 MEQ tablet 20 mEq, Oral, Daily   traMADol (ULTRAM) 50 mg, Oral, Every 6 hours PRN   valsartan (DIOVAN) 160 mg, Oral, Daily     VITALS:   Vitals:   07/16/22 0921  BP: (!) 213/108  Pulse: 69  Resp: 18  SpO2: 95%  Weight: 250 lb (113.4 kg)  Height: '5\' 3"'$  (1.6 m)       No data to display          PHYSICAL EXAM   HEENT:  Normocephalic, atraumatic. The mucous membranes are moist. The superficial temporal arteries are without ropiness or tenderness. Cardiovascular: Regular rate and rhythm. Lungs: Clear to auscultation bilaterally. Neck: There are no carotid bruits noted bilaterally.  NEUROLOGICAL:    07/16/2022    9:00 AM  Montreal Cognitive Assessment   Visuospatial/ Executive (0/5) 2  Naming (0/3) 3  Attention: Read list of digits (0/2) 2  Attention: Read list of letters (0/1) 1  Attention: Serial 7 subtraction starting at 100 (0/3) 1  Language: Repeat phrase (0/2) 2  Language : Fluency (0/1) 0  Abstraction (0/2) 1  Delayed Recall (0/5) 4  Orientation (0/6) 5  Total 21  Adjusted Score (based on education) 21       07/13/2020   12:51 PM  MMSE - Mini Mental State Exam  Orientation to time 4  Orientation to Place 4  Registration 3  Attention/ Calculation 5  Recall 1  Language- name 2 objects 2  Language- repeat 1  Language- follow 3 step command 1  Language- read & follow direction 1  Write a sentence 1  Copy design 1  Total score 24      Orientation:  Alert and oriented to person, place and time. No aphasia or dysarthria. Fund of knowledge is appropriate. Recent memory impaired and remote memory intact.  Attention and concentration are reduced Able to name objects and repeat phrases. Delayed recall  4/5 Cranial nerves: There is good facial symmetry. Extraocular muscles are intact and visual fields are full to confrontational testing. Speech is fluent and clear. Soft palate rises symmetrically and there is  no tongue deviation. Hearing is intact to conversational tone. Tone: Tone is good throughout. Sensation: Sensation is intact to light touch and pinprick throughout. Vibration is intact at the bilateral big toe.There is no extinction with double simultaneous stimulation. There is no sensory dermatomal level identified. Coordination: The patient has no difficulty with RAM's or FNF bilaterally. Normal finger to nose  Motor: Strength is 5/5 in the bilateral upper and lower extremities. There is no pronator drift. There are no fasciculations noted. DTR's: Deep tendon reflexes are 1/4 at the bilateral biceps, triceps, brachioradialis, patella and achilles.  Plantar responses are downgoing bilaterally. Gait and Station: The patient is able to ambulate without difficulty but due to arthritis she moves slowly.The patient is able to heel toe walk without any difficulty.The patient is able to ambulate in a tandem fashion. The patient is able to stand in the Romberg position.    Thank you for allowing [place] the opportunity to participate in the care of this nice patient. Please do not hesitate to contact us for any questions or concerns.   Total time spent on today's visit was 60 minutes dedicated to this patient today, preparing to see patient, examining the patient, ordering tests and/or medications and counseling the patient, documenting clinical information in the EHR or other health record, independently interpreting results and communicating  results to the patient/family, discussing treatment and goals, answering patient's questions and coordinating care.  Cc:  Glenis Smoker, MD  Sharene Butters 07/16/2022 9:26 AM

## 2022-07-16 NOTE — Progress Notes (Signed)
Thyroid levels are abnormal and may affect the memory to a certain extent. Will send the results to the PCP to  follow, thanks . The B12 is normal

## 2022-07-16 NOTE — Patient Instructions (Addendum)
It was a pleasure to see you today at our office.   Recommendations:  MRI of the brain, the radiology office will call you to arrange you appointment Check labs today Start Memantine '5mg'$  tablets.  Take 1 tablet at bedtime for 2 weeks, then 1 tablet twice daily.   Side effects include dizziness, headache, diarrhea or constipation.  Call if severe symptoms appear  Follow up in 3 months   Whom to call:  Memory  decline, memory medications: Call our office [phone]   For psychiatric meds, mood meds: Please have your primary care physician manage these medications.   Counseling regarding caregiver distress, including caregiver depression, anxiety and issues regarding community resources, adult day care programs, adult living facilities, or memory care questions:   Feel free to contact [place], Social Worker at [phone]   For assessment of decision of mental capacity and competency:  Call Dr. Anthoney Harada, geriatric psychiatrist at [phone]  For guidance in geriatric dementia issues please call [facility] [phone]  For guidance regarding [facility] and if placement were needed at the facility, contact Arnell Asal, Social Worker tel: [phone]  If you have any severe symptoms of a stroke, or other severe issues such as confusion,severe chills or fever, etc call 911 or go to the ER as you may need to be evaluated further   Feel free to visit Facebook page " [email or web address]" for tips of how to care for people with memory problems.       RECOMMENDATIONS FOR ALL PATIENTS WITH MEMORY PROBLEMS: 1. Continue to exercise (Recommend 30 minutes of walking everyday, or 3 hours every week) 2. Increase social interactions - continue going to [place] and enjoy social gatherings with friends and family 3. Eat healthy, avoid fried foods and eat more fruits and vegetables 4. Maintain adequate blood pressure, blood sugar, and blood cholesterol  level. Reducing the risk of stroke and cardiovascular disease also helps promoting better memory. 5. Avoid stressful situations. Live a simple life and avoid aggravations. Organize your time and prepare for the next day in anticipation. 6. Sleep well, avoid any interruptions of sleep and avoid any distractions in the bedroom that may interfere with adequate sleep quality 7. Avoid sugar, avoid sweets as there is a strong link between excessive sugar intake, diabetes, and cognitive impairment We discussed the Mediterranean diet, which has been shown to help patients reduce the risk of progressive memory disorders and reduces cardiovascular risk. This includes eating fish, eat fruits and green leafy vegetables, nuts like almonds and hazelnuts, walnuts, and also use olive oil. Avoid fast foods and fried foods as much as possible. Avoid sweets and sugar as sugar use has been linked to worsening of memory function.  There is always a concern of gradual progression of memory problems. If this is the case, then we may need to adjust level of care according to patient needs. Support, both to the patient and caregiver, should then be put into place.         FALL PRECAUTIONS: Be cautious when walking. Scan the area for obstacles that may increase the risk of trips and falls. When getting up in the mornings, sit up at the edge of the bed for a few minutes before getting out of bed. Consider elevating the bed at the head end to avoid drop of blood pressure when getting up. Walk always in a well-lit room (use night lights in the walls). Avoid area rugs or power cords from  appliances in the middle of the walkways. Use a walker or a cane if necessary and consider physical therapy for balance exercise. Get your eyesight checked regularly.  FINANCIAL OVERSIGHT: Supervision, especially oversight when making financial decisions or transactions is also recommended.  HOME SAFETY: Consider the safety of the kitchen when  operating appliances like stoves, microwave oven, and blender. Consider having supervision and share cooking responsibilities until no longer able to participate in those. Accidents with firearms and other hazards in the house should be identified and addressed as well.   ABILITY TO BE LEFT ALONE: If patient is unable to contact 911 operator, consider using LifeLine, or when the need is there, arrange for someone to stay with patients. Smoking is a fire hazard, consider supervision or cessation. Risk of wandering should be assessed by caregiver and if detected at any point, supervision and safe proof recommendations should be instituted.  MEDICATION SUPERVISION: Inability to self-administer medication needs to be constantly addressed. Implement a mechanism to ensure safe administration of the medications.   DRIVING: Regarding driving, in patients with progressive memory problems, driving will be impaired. We advise to have someone else do the driving if trouble finding directions or if minor accidents are reported. Independent driving assessment is available to determine safety of driving.   If you are interested in the driving assessment, you can contact the following:  [employer] in [place]  [place] [phone] or [phone]    [place] refers to food and lifestyle choices that are based on the traditions of countries located on the [employer]. This way of eating has been shown to help prevent certain conditions and improve outcomes for people who have chronic diseases, like kidney disease and heart disease. What are tips for following this plan? Lifestyle  Cook and eat meals together with your family, when possible. Drink enough fluid to keep your urine clear or pale yellow. Be physically active every day. This  includes: Aerobic exercise like running or swimming. Leisure activities like gardening, walking, or housework. Get 7-8 hours of sleep each night. If recommended by your health care provider, drink red wine in moderation. This means 1 glass a day for nonpregnant women and 2 glasses a day for men. A glass of wine equals 5 oz (150 mL). Reading food labels  Check the serving size of packaged foods. For foods such as rice and pasta, the serving size refers to the amount of cooked product, not dry. Check the total fat in packaged foods. Avoid foods that have saturated fat or trans fats. Check the ingredients list for added sugars, such as corn syrup. Shopping  At the grocery store, buy most of your food from the areas near the walls of the store. This includes: Fresh fruits and vegetables (produce). Grains, beans, nuts, and seeds. Some of these may be available in unpackaged forms or large amounts (in bulk). Fresh seafood. Poultry and eggs. Low-fat dairy products. Buy whole ingredients instead of prepackaged foods. Buy fresh fruits and vegetables in-season from local farmers markets. Buy frozen fruits and vegetables in resealable bags. If you do not have access to quality fresh seafood, buy precooked frozen shrimp or canned fish, such as tuna, salmon, or sardines. Buy small amounts of raw or cooked vegetables, salads, or olives from the deli or salad bar at your store. Stock your pantry so you always have certain foods on hand, such as  olive oil, canned tuna, canned tomatoes, rice, pasta, and beans. Cooking  Cook foods with extra-virgin olive oil instead of using butter or other vegetable oils. Have meat as a side dish, and have vegetables or grains as your main dish. This means having meat in small portions or adding small amounts of meat to foods like pasta or stew. Use beans or vegetables instead of meat in common dishes like chili or lasagna. Experiment with different cooking methods. Try  roasting or broiling vegetables instead of steaming or sauteing them. Add frozen vegetables to soups, stews, pasta, or rice. Add nuts or seeds for added healthy fat at each meal. You can add these to yogurt, salads, or vegetable dishes. Marinate fish or vegetables using olive oil, lemon juice, garlic, and fresh herbs. Meal planning  Plan to eat 1 vegetarian meal one day each week. Try to work up to 2 vegetarian meals, if possible. Eat seafood 2 or more times a week. Have healthy snacks readily available, such as: Vegetable sticks with hummus. Greek yogurt. Fruit and nut trail mix. Eat balanced meals throughout the week. This includes: Fruit: 2-3 servings a day Vegetables: 4-5 servings a day Low-fat dairy: 2 servings a day Fish, poultry, or lean meat: 1 serving a day Beans and legumes: 2 or more servings a week Nuts and seeds: 1-2 servings a day Whole grains: 6-8 servings a day Extra-virgin olive oil: 3-4 servings a day Limit red meat and sweets to only a few servings a month What are my food choices? Mediterranean diet Recommended Grains: Whole-grain pasta. Brown rice. Bulgar wheat. Polenta. Couscous. Whole-wheat bread. [place]. Vegetables: Artichokes. Beets. Broccoli. Cabbage. Carrots. Eggplant. Green beans. Chard. Kale. Spinach. Onions. Leeks. Peas. Squash. Tomatoes. Peppers. Radishes. Fruits: Apples. Apricots. Avocado. Berries. Bananas. Cherries. Dates. Figs. Grapes. Lemons. Melon. Oranges. Peaches. Plums. Pomegranate. Meats and other protein foods: Beans. Almonds. Sunflower seeds. Pine nuts. Peanuts. [place]. Salmon. Scallops. Shrimp. [place]. Tilapia. Clams. Oysters. Eggs. Dairy: Low-fat milk. Cheese. Greek yogurt. Beverages: Water. Red wine. Herbal tea. Fats and oils: Extra virgin olive oil. Avocado oil. Grape seed oil. Sweets and desserts: [place] yogurt with honey. Baked apples. Poached pears. Trail mix. Seasoning and other foods: Basil. Cilantro. Coriander. Cumin. Mint.  Parsley. Sage. Rosemary. Tarragon. Garlic. Oregano. Thyme. Pepper. Balsalmic vinegar. Tahini. Hummus. Tomato sauce. Olives. Mushrooms. Limit these Grains: Prepackaged pasta or rice dishes. Prepackaged cereal with added sugar. Vegetables: Deep fried potatoes (french fries). Fruits: Fruit canned in syrup. Meats and other protein foods: Beef. Pork. Lamb. Poultry with skin. Hot dogs. [place] Salines. Dairy: Ice cream. Sour cream. Whole milk. Beverages: Juice. Sugar-sweetened soft drinks. Beer. Liquor and spirits. Fats and oils: Butter. Canola oil. Vegetable oil. Beef fat (tallow). Lard. Sweets and desserts: Cookies. Cakes. Pies. Candy. Seasoning and other foods: Mayonnaise. Premade sauces and marinades. The items listed may not be a complete list. Talk with your dietitian about what dietary choices are right for you. Summary The Mediterranean diet includes both food and lifestyle choices. Eat a variety of fresh fruits and vegetables, beans, nuts, seeds, and whole grains. Limit the amount of red meat and sweets that you eat. Talk with your health care provider about whether it is safe for you to drink red wine in moderation. This means 1 glass a day for nonpregnant women and 2 glasses a day for men. A glass of wine equals 5 oz (150 mL). This information is not intended to replace advice given to you by your health care provider. Make sure you discuss any  questions you have with your health care provider. Document Released: 05/24/2016 Document Revised: 06/26/2016 Document Reviewed: 05/24/2016 Elsevier Interactive Patient Education  2017 [employer].

## 2022-08-04 ENCOUNTER — Ambulatory Visit
Admission: RE | Admit: 2022-08-04 | Discharge: 2022-08-04 | Disposition: A | Discharge status: Home / Self Care | Payer: Medicare Other | Source: Ambulatory Visit | Attending: Physician Assistant | Admitting: Physician Assistant

## 2022-08-04 DIAGNOSIS — I639 Cerebral infarction, unspecified: Secondary | ICD-10-CM | POA: Diagnosis not present

## 2022-08-04 DIAGNOSIS — R5381 Other malaise: Secondary | ICD-10-CM | POA: Diagnosis not present

## 2022-08-04 DIAGNOSIS — R413 Other amnesia: Secondary | ICD-10-CM | POA: Diagnosis not present

## 2022-08-04 DIAGNOSIS — I6782 Cerebral ischemia: Secondary | ICD-10-CM | POA: Diagnosis not present

## 2022-08-13 DIAGNOSIS — R413 Other amnesia: Secondary | ICD-10-CM | POA: Diagnosis not present

## 2022-09-25 DIAGNOSIS — H109 Unspecified conjunctivitis: Secondary | ICD-10-CM | POA: Diagnosis not present

## 2022-09-25 DIAGNOSIS — R519 Headache, unspecified: Secondary | ICD-10-CM | POA: Diagnosis not present

## 2022-09-25 DIAGNOSIS — R946 Abnormal results of thyroid function studies: Secondary | ICD-10-CM | POA: Diagnosis not present

## 2022-09-25 DIAGNOSIS — H5712 Ocular pain, left eye: Secondary | ICD-10-CM | POA: Diagnosis not present

## 2022-09-26 DIAGNOSIS — B0239 Other herpes zoster eye disease: Secondary | ICD-10-CM | POA: Diagnosis not present

## 2022-10-03 DIAGNOSIS — H2000 Unspecified acute and subacute iridocyclitis: Secondary | ICD-10-CM | POA: Diagnosis not present

## 2022-10-16 ENCOUNTER — Ambulatory Visit: Payer: Medicare Other | Admitting: Physician Assistant

## 2022-10-16 ENCOUNTER — Encounter: Payer: Self-pay | Admitting: Physician Assistant

## 2022-10-16 DIAGNOSIS — Z029 Encounter for administrative examinations, unspecified: Secondary | ICD-10-CM

## 2022-10-16 NOTE — Progress Notes (Incomplete)
Assessment/Plan:   Memory Impairment  Sherry Fitzpatrick is a very pleasant 72 y.o. RH female  with  a history of hypertension, dCHF, DM2,  CAD s/p NSTEMI, prior CVA with chronic dysphagia on Plavix ( 06/2020 involving the L cerebellar artery), PAF, hyperlipidemia, COPD, CKD3, OSA not on CPAP, arthritis seen today in follow up for memory loss. Patient is currently on . 08/06/22  MRI brain personally reviewed was remarkable for  Moderate small-vessel ischemic change and a few scattered small remote lacunar infarcts, overall similar to 2021.2. Parenchymal volume appears within normal limits with no disproportionate hippocampal atrophy.      Follow up in   months. Continue Memantine 5 mg twice daily. Side effects were discussed  Recommend good control of cardiovascular risk factors.  Follow up with Cards Continue to control mood as per PCP      Subjective:    This patient is accompanied in the office by her daughter *** who supplements the history.  Previous records as well as any outside records available were reviewed prior to todays visit. Patient was last seen on ***07/2022 at which time her MoCA was 21/30.   Any changes in memory since last visit? repeats oneself?  Endorsed Disoriented when walking into a room?  Patient denies except occasionally not remembering what patient came to the room for ***  Leaving objects in unusual places?    denies   Wandering behavior?  denies   Any personality changes since last visit?  denies   Any worsening depression?:  denies   Hallucinations or paranoia?  denies   Seizures?    denies    Any sleep changes?  Denies vivid dreams, REM behavior or sleepwalking   Sleep apnea?   Endorsed, not on CPAP*** Any hygiene concerns?  She does ot want to shower often*** Independent of bathing and dressing?  Endorsed  Does the patient needs help with medications?Daughter is in charge *** Who is in charge of the finances?   Daughter is in charge   *** Any  changes in appetite?  denies ***   Patient have trouble swallowing?  denies   Does the patient cook?  Any kitchen accidents such as leaving the stove on? Patient denies   Any headaches?   denies   Chronic back pain  denies   Ambulates with difficulty?   Uses a  walker due to arthritis of the hips Recent falls or head injuries? denies     Unilateral weakness, numbness or tingling?    denies   Any tremors?  denies   Any anosmia?  Patient denies   Any incontinence of urine?  denies   Any bowel dysfunction?     denies      Patient lives  *** Does the patient drive?***  Initial Visit 07/2022  How long did patient have memory difficulties?   At least since for the last 5 years , worse after her MI in 2021. SHe may forget recent conversations or follow instructions. LTMis good.  Patient lives with: daughter repeats oneself?  Her daughter began repeating herself, asking the same questions, progressively worse  Disoriented when walking into a room?  Patient denies   Leaving objects in unusual places?  Patient denies   Ambulates  with difficulty?  She uses a walker due to arthritis of the hips Recent falls?  Patient denies   Any head injuries?  Patient denies   History of seizures?   Patient denies   Wandering behavior?  Patient  denies   Patient drives?   Sometimes. She does not drive too much, a couple of times she got lost and did not know where she was, so her daughter does most of the driving Any mood changes such irritability agitation?  Patient denies   Any history of depression?:  Patient denies, but daughter feels that she is depressed  Hallucinations?  Patient denies   Paranoia?  Patient denies   Patient reports that he sleeps well without vivid dreams, REM behavior or sleepwalking    She wakes up intermittently.  History of sleep apnea?  Diagnosed on 2016, "never got he  CPAP" but she snores and she is scheduled for a sleep study as per PCP Any hygiene concerns?  Theres are 3 days  without showering,  "too tiring"  Independent of bathing and dressing?  Endorsed  Does the patient needs help with medications?  Daughter in charge since 2021 "so many of them I don't want to take them". She has not taken her BP med this morning Who is in charge of the finances?  Daughter is in charge since 2021  Any changes in appetite?  Patient denies . Craves for sweets  Patient have trouble swallowing? Patient denies   Does the patient cook?  Patient denies   Any kitchen accidents such as leaving the stove on? Endorsed, last week she left food on the stove Any headaches?  When BP is elevated she has headaches "on top of the head"  Double vision? Denies   Any focal numbness or tingling?  Patient denies   Chronic back pain Patient denies   Unilateral weakness?  Patient denies   Any tremors?  Patient denies   Any history of anosmia?  Patient denies   Any incontinence of urine?  Patient denies   Any bowel dysfunction?   Patient denies   History of heavy alcohol intake?  Patient denies   History of heavy tobacco use?  D/c'd in 2020 Family history of dementia?  Fa, 3 Sisters possible vascular dementia     Stroke workup 2021Dr Lidnzen    Stroke: Patchy small volume acute ischemic infarcts involving the right splenium and right cerebral white matter, right hippocampus with additional punctate acute ischemic left cerebellar infarct - likely embolic from new atrial fibrillation versus post STEMI procedure Resultant right hemiparesis    CT head - No acute intracranial pathology. Mild chronic microvascular ischemic changes. MRI head - Patchy small volume acute ischemic infarcts involving the right splenium and right cerebral white matter as above, with additional punctate acute ischemic left cerebellar infarct. Underlying age-related cerebral atrophy with moderate chronic microvascular ischemic disease.  MRA head - Extensive intracranial atherosclerotic disease with multifocal moderate to severe  stenoses involving both the anterior and posterior circulation. MRA Neck - Approximate 30-40% stenoses about the origins of both internal carotid arteries, left slightly worse than right CTA H&N - not ordered CT Perfusion - not ordered Carotid Doppler - MRA neck ordered - carotid dopplers not indicated. EEG - This study is suggestive of severe diffuse encephalopathy, nonspecific etiology but likely related to sedation. No seizures or epileptiform discharges were seen throughout the recording.  2D Echo - 03/28/20 - EF 60 - 65%. No cardiac source of emboli identified.  Sars Corona Virus 2 - negative LDL - 145 HgbA1c - 7.3 UDS - not ordered VTE prophylaxis - SCDs Aspirin 81 mg and Plavix 75 mg daily prior to admission, now on Aspirin 81 mg and Plavix 75 mg daily  PREVIOUS MEDICATIONS:   CURRENT MEDICATIONS:  Outpatient Encounter Medications as of 10/16/2022  Medication Sig   acetaminophen (TYLENOL) 325 MG tablet Take 2 tablets (650 mg total) by mouth every 6 (six) hours as needed (Mild pain).   albuterol (VENTOLIN HFA) 108 (90 Base) MCG/ACT inhaler Inhale 2 puffs into the lungs every 6 (six) hours as needed for wheezing.   amiodarone (PACERONE) 100 MG tablet Take 1 tablet (100 mg total) by mouth daily.   amLODipine (NORVASC) 10 MG tablet Take 1 tablet (10 mg total) by mouth daily.   aspirin 81 MG chewable tablet Chew 81 mg by mouth daily.   atorvastatin (LIPITOR) 80 MG tablet Take 1 tablet (80 mg total) by mouth daily.   budesonide-formoterol (SYMBICORT) 160-4.5 MCG/ACT inhaler Inhale 2 puffs into the lungs 2 (two) times daily.   carvedilol (COREG) 6.25 MG tablet Take 1 tablet (6.25 mg total) by mouth 2 (two) times daily.   clopidogrel (PLAVIX) 75 MG tablet Take 1 tablet (75 mg total) by mouth daily.   docusate sodium (COLACE) 100 MG capsule Take 1 capsule (100 mg total) by mouth 2 (two) times daily.   ferrous sulfate 325 (65 FE) MG tablet Take 1 tablet (325 mg total) by mouth 3 (three) times  daily with meals.   furosemide (LASIX) 40 MG tablet Take 1 tablet (40 mg total) by mouth daily at 2 PM.   isosorbide mononitrate (IMDUR) 60 MG 24 hr tablet Take 1 tablet (60 mg total) by mouth at bedtime.   memantine (NAMENDA) 5 MG tablet Take 1 tablet (5 mg at night) for 2 weeks, then increase to 1 tablet (5 mg) twice a day   metFORMIN (GLUCOPHAGE) 500 MG tablet Take 1 tablet (500 mg total) by mouth daily with breakfast.   nitroGLYCERIN (NITROSTAT) 0.4 MG SL tablet Place 1 tablet (0.4 mg total) under the tongue every 5 (five) minutes x 3 doses as needed for chest pain.   pantoprazole (PROTONIX) 40 MG tablet Take 1 tablet (40 mg total) by mouth at bedtime.   polyethylene glycol (MIRALAX / GLYCOLAX) 17 g packet Take 17 g by mouth daily.   potassium chloride (KLOR-CON) 10 MEQ tablet Take 2 tablets (20 mEq total) by mouth daily.   traMADol (ULTRAM) 50 MG tablet Take 1 tablet (50 mg total) by mouth every 6 (six) hours as needed for moderate pain or severe pain.   valsartan (DIOVAN) 160 MG tablet Take 1 tablet (160 mg total) by mouth daily.   No facility-administered encounter medications on file as of 10/16/2022.       07/13/2020   12:51 PM  MMSE - Mini Mental State Exam  Orientation to time 4  Orientation to Place 4  Registration 3  Attention/ Calculation 5  Recall 1  Language- name 2 objects 2  Language- repeat 1  Language- follow 3 step command 1  Language- read & follow direction 1  Write a sentence 1  Copy design 1  Total score 24      07/16/2022    9:00 AM  Montreal Cognitive Assessment   Visuospatial/ Executive (0/5) 2  Naming (0/3) 3  Attention: Read list of digits (0/2) 2  Attention: Read list of letters (0/1) 1  Attention: Serial 7 subtraction starting at 100 (0/3) 1  Language: Repeat phrase (0/2) 2  Language : Fluency (0/1) 0  Abstraction (0/2) 1  Delayed Recall (0/5) 4  Orientation (0/6) 5  Total 21  Adjusted Score (based on education) 21  Objective:      PHYSICAL EXAMINATION:    VITALS:  There were no vitals filed for this visit.  GEN:  The patient appears stated age and is in NAD. HEENT:  Normocephalic, atraumatic.   Neurological examination:  General: NAD, well-groomed, appears stated age. Orientation: The patient is alert. Oriented to person, place and date Cranial nerves: There is good facial symmetry.The speech is fluent and clear. No aphasia or dysarthria. Fund of knowledge is appropriate. Recent and remote memory are impaired. Attention and concentration are reduced.  Able to name objects and repeat phrases.  Hearing is intact to conversational tone.    Sensation: Sensation is intact to light touch throughout Motor: Strength is at least antigravity x4. DTR's 2/4 in UE/LE     Movement examination: Tone: There is normal tone in the UE/LE Abnormal movements:  no tremor.  No myoclonus.  No asterixis.   Coordination:  There is no decremation with RAM's. Normal finger to nose  Gait and Station: The patient has no difficulty arising out of a deep-seated chair without the use of the hands. The patient's stride length is good.  Gait is cautious and narrow.    Thank you for allowing [place] the opportunity to participate in the care of this nice patient. Please do not hesitate to contact us for any questions or concerns.   Total time spent on today's visit was *** minutes dedicated to this patient today, preparing to see patient, examining the patient, ordering tests and/or medications and counseling the patient, documenting clinical information in the EHR or other health record, independently interpreting results and communicating results to the patient/family, discussing treatment and goals, answering patient's questions and coordinating care.  Cc:  Glenis Smoker, MD  Sharene Butters 10/16/2022 7:27 AM

## 2022-12-15 ENCOUNTER — Emergency Department (HOSPITAL_COMMUNITY): Payer: 59

## 2022-12-15 ENCOUNTER — Other Ambulatory Visit: Payer: Self-pay

## 2022-12-15 ENCOUNTER — Encounter (HOSPITAL_COMMUNITY): Payer: Self-pay | Admitting: Emergency Medicine

## 2022-12-15 ENCOUNTER — Inpatient Hospital Stay (HOSPITAL_COMMUNITY)
Admission: EM | Admit: 2022-12-15 | Discharge: 2022-12-26 | DRG: 190 | Disposition: A | Discharge status: Home Health | Payer: 59 | Attending: Internal Medicine | Admitting: Internal Medicine

## 2022-12-15 DIAGNOSIS — Z955 Presence of coronary angioplasty implant and graft: Secondary | ICD-10-CM

## 2022-12-15 DIAGNOSIS — Z888 Allergy status to other drugs, medicaments and biological substances status: Secondary | ICD-10-CM

## 2022-12-15 DIAGNOSIS — Z885 Allergy status to narcotic agent status: Secondary | ICD-10-CM

## 2022-12-15 DIAGNOSIS — J9601 Acute respiratory failure with hypoxia: Secondary | ICD-10-CM | POA: Diagnosis present

## 2022-12-15 DIAGNOSIS — J45901 Unspecified asthma with (acute) exacerbation: Secondary | ICD-10-CM | POA: Diagnosis present

## 2022-12-15 DIAGNOSIS — I10 Essential (primary) hypertension: Secondary | ICD-10-CM | POA: Diagnosis not present

## 2022-12-15 DIAGNOSIS — G4733 Obstructive sleep apnea (adult) (pediatric): Secondary | ICD-10-CM | POA: Diagnosis present

## 2022-12-15 DIAGNOSIS — D631 Anemia in chronic kidney disease: Secondary | ICD-10-CM | POA: Diagnosis not present

## 2022-12-15 DIAGNOSIS — J449 Chronic obstructive pulmonary disease, unspecified: Secondary | ICD-10-CM | POA: Diagnosis present

## 2022-12-15 DIAGNOSIS — Z8673 Personal history of transient ischemic attack (TIA), and cerebral infarction without residual deficits: Secondary | ICD-10-CM

## 2022-12-15 DIAGNOSIS — Z833 Family history of diabetes mellitus: Secondary | ICD-10-CM

## 2022-12-15 DIAGNOSIS — I2510 Atherosclerotic heart disease of native coronary artery without angina pectoris: Secondary | ICD-10-CM | POA: Diagnosis not present

## 2022-12-15 DIAGNOSIS — N183 Chronic kidney disease, stage 3 unspecified: Secondary | ICD-10-CM | POA: Diagnosis not present

## 2022-12-15 DIAGNOSIS — E1122 Type 2 diabetes mellitus with diabetic chronic kidney disease: Secondary | ICD-10-CM | POA: Diagnosis not present

## 2022-12-15 DIAGNOSIS — J439 Emphysema, unspecified: Secondary | ICD-10-CM | POA: Diagnosis not present

## 2022-12-15 DIAGNOSIS — N1832 Chronic kidney disease, stage 3b: Secondary | ICD-10-CM | POA: Diagnosis not present

## 2022-12-15 DIAGNOSIS — I480 Paroxysmal atrial fibrillation: Secondary | ICD-10-CM | POA: Diagnosis present

## 2022-12-15 DIAGNOSIS — I425 Other restrictive cardiomyopathy: Secondary | ICD-10-CM | POA: Diagnosis present

## 2022-12-15 DIAGNOSIS — R0602 Shortness of breath: Secondary | ICD-10-CM | POA: Diagnosis not present

## 2022-12-15 DIAGNOSIS — F419 Anxiety disorder, unspecified: Secondary | ICD-10-CM | POA: Diagnosis not present

## 2022-12-15 DIAGNOSIS — Z66 Do not resuscitate: Secondary | ICD-10-CM | POA: Diagnosis not present

## 2022-12-15 DIAGNOSIS — Z1152 Encounter for screening for COVID-19: Secondary | ICD-10-CM | POA: Diagnosis not present

## 2022-12-15 DIAGNOSIS — N179 Acute kidney failure, unspecified: Secondary | ICD-10-CM | POA: Diagnosis not present

## 2022-12-15 DIAGNOSIS — Z7951 Long term (current) use of inhaled steroids: Secondary | ICD-10-CM

## 2022-12-15 DIAGNOSIS — I5043 Acute on chronic combined systolic (congestive) and diastolic (congestive) heart failure: Secondary | ICD-10-CM | POA: Diagnosis not present

## 2022-12-15 DIAGNOSIS — R0902 Hypoxemia: Secondary | ICD-10-CM | POA: Diagnosis not present

## 2022-12-15 DIAGNOSIS — I080 Rheumatic disorders of both mitral and aortic valves: Secondary | ICD-10-CM | POA: Diagnosis present

## 2022-12-15 DIAGNOSIS — D649 Anemia, unspecified: Secondary | ICD-10-CM | POA: Diagnosis not present

## 2022-12-15 DIAGNOSIS — G3184 Mild cognitive impairment, so stated: Secondary | ICD-10-CM | POA: Diagnosis not present

## 2022-12-15 DIAGNOSIS — I5031 Acute diastolic (congestive) heart failure: Secondary | ICD-10-CM | POA: Diagnosis not present

## 2022-12-15 DIAGNOSIS — Y92239 Unspecified place in hospital as the place of occurrence of the external cause: Secondary | ICD-10-CM | POA: Diagnosis not present

## 2022-12-15 DIAGNOSIS — F32A Depression, unspecified: Secondary | ICD-10-CM | POA: Diagnosis present

## 2022-12-15 DIAGNOSIS — T502X5A Adverse effect of carbonic-anhydrase inhibitors, benzothiadiazides and other diuretics, initial encounter: Secondary | ICD-10-CM | POA: Diagnosis not present

## 2022-12-15 DIAGNOSIS — J45909 Unspecified asthma, uncomplicated: Secondary | ICD-10-CM | POA: Diagnosis present

## 2022-12-15 DIAGNOSIS — Z96641 Presence of right artificial hip joint: Secondary | ICD-10-CM | POA: Diagnosis present

## 2022-12-15 DIAGNOSIS — K219 Gastro-esophageal reflux disease without esophagitis: Secondary | ICD-10-CM | POA: Diagnosis present

## 2022-12-15 DIAGNOSIS — Z794 Long term (current) use of insulin: Secondary | ICD-10-CM

## 2022-12-15 DIAGNOSIS — E785 Hyperlipidemia, unspecified: Secondary | ICD-10-CM | POA: Diagnosis present

## 2022-12-15 DIAGNOSIS — Z7984 Long term (current) use of oral hypoglycemic drugs: Secondary | ICD-10-CM

## 2022-12-15 DIAGNOSIS — R062 Wheezing: Secondary | ICD-10-CM | POA: Diagnosis not present

## 2022-12-15 DIAGNOSIS — Z7982 Long term (current) use of aspirin: Secondary | ICD-10-CM

## 2022-12-15 DIAGNOSIS — I5022 Chronic systolic (congestive) heart failure: Secondary | ICD-10-CM | POA: Diagnosis present

## 2022-12-15 DIAGNOSIS — Z79899 Other long term (current) drug therapy: Secondary | ICD-10-CM

## 2022-12-15 DIAGNOSIS — Z87891 Personal history of nicotine dependence: Secondary | ICD-10-CM

## 2022-12-15 DIAGNOSIS — I130 Hypertensive heart and chronic kidney disease with heart failure and stage 1 through stage 4 chronic kidney disease, or unspecified chronic kidney disease: Secondary | ICD-10-CM | POA: Diagnosis present

## 2022-12-15 DIAGNOSIS — E873 Alkalosis: Secondary | ICD-10-CM | POA: Diagnosis not present

## 2022-12-15 DIAGNOSIS — R0789 Other chest pain: Secondary | ICD-10-CM | POA: Diagnosis present

## 2022-12-15 DIAGNOSIS — Z7902 Long term (current) use of antithrombotics/antiplatelets: Secondary | ICD-10-CM

## 2022-12-15 DIAGNOSIS — J441 Chronic obstructive pulmonary disease with (acute) exacerbation: Secondary | ICD-10-CM | POA: Diagnosis not present

## 2022-12-15 DIAGNOSIS — I252 Old myocardial infarction: Secondary | ICD-10-CM

## 2022-12-15 DIAGNOSIS — Z6841 Body Mass Index (BMI) 40.0 and over, adult: Secondary | ICD-10-CM

## 2022-12-15 DIAGNOSIS — Z8249 Family history of ischemic heart disease and other diseases of the circulatory system: Secondary | ICD-10-CM

## 2022-12-15 DIAGNOSIS — R001 Bradycardia, unspecified: Secondary | ICD-10-CM | POA: Diagnosis not present

## 2022-12-15 DIAGNOSIS — E1165 Type 2 diabetes mellitus with hyperglycemia: Secondary | ICD-10-CM | POA: Diagnosis not present

## 2022-12-15 DIAGNOSIS — G4730 Sleep apnea, unspecified: Secondary | ICD-10-CM | POA: Diagnosis present

## 2022-12-15 DIAGNOSIS — Z880 Allergy status to penicillin: Secondary | ICD-10-CM

## 2022-12-15 HISTORY — DX: Shock, unspecified: R57.9

## 2022-12-15 HISTORY — DX: Non-ST elevation (NSTEMI) myocardial infarction: I21.4

## 2022-12-15 LAB — COMPREHENSIVE METABOLIC PANEL
ALT: 30 U/L (ref 0–44)
AST: 24 U/L (ref 15–41)
Albumin: 3.2 g/dL — ABNORMAL LOW (ref 3.5–5.0)
Alkaline Phosphatase: 56 U/L (ref 38–126)
Anion gap: 9 (ref 5–15)
BUN: 15 mg/dL (ref 8–23)
CO2: 29 mmol/L (ref 22–32)
Calcium: 8.7 mg/dL — ABNORMAL LOW (ref 8.9–10.3)
Chloride: 101 mmol/L (ref 98–111)
Creatinine, Ser: 1.16 mg/dL — ABNORMAL HIGH (ref 0.44–1.00)
GFR, Estimated: 50 mL/min — ABNORMAL LOW (ref >60)
Glucose, Bld: 111 mg/dL — ABNORMAL HIGH (ref 70–99)
Potassium: 4 mmol/L (ref 3.5–5.1)
Sodium: 139 mmol/L (ref 135–145)
Total Bilirubin: 0.9 mg/dL (ref 0.3–1.2)
Total Protein: 7.6 g/dL (ref 6.5–8.1)

## 2022-12-15 LAB — CBC WITH DIFFERENTIAL/PLATELET
Abs Immature Granulocytes: 0.02 10*3/uL (ref 0.00–0.07)
Basophils Absolute: 0 10*3/uL (ref 0.0–0.1)
Basophils Relative: 1 %
Eosinophils Absolute: 0.3 10*3/uL (ref 0.0–0.5)
Eosinophils Relative: 4 %
HCT: 34.5 % — ABNORMAL LOW (ref 36.0–46.0)
Hemoglobin: 10.7 g/dL — ABNORMAL LOW (ref 12.0–15.0)
Immature Granulocytes: 0 %
Lymphocytes Relative: 26 %
Lymphs Abs: 1.8 10*3/uL (ref 0.7–4.0)
MCH: 29.3 pg (ref 26.0–34.0)
MCHC: 31 g/dL (ref 30.0–36.0)
MCV: 94.5 fL (ref 80.0–100.0)
Monocytes Absolute: 0.7 10*3/uL (ref 0.1–1.0)
Monocytes Relative: 10 %
Neutro Abs: 4.2 10*3/uL (ref 1.7–7.7)
Neutrophils Relative %: 59 %
Platelets: 297 10*3/uL (ref 150–400)
RBC: 3.65 MIL/uL — ABNORMAL LOW (ref 3.87–5.11)
RDW: 15.8 % — ABNORMAL HIGH (ref 11.5–15.5)
WBC: 7 10*3/uL (ref 4.0–10.5)
nRBC: 0 % (ref 0.0–0.2)

## 2022-12-15 LAB — RESP PANEL BY RT-PCR (RSV, FLU A&B, COVID)  RVPGX2
Influenza A by PCR: NEGATIVE
Influenza B by PCR: NEGATIVE
Resp Syncytial Virus by PCR: NEGATIVE
SARS Coronavirus 2 by RT PCR: NEGATIVE

## 2022-12-15 LAB — BRAIN NATRIURETIC PEPTIDE: B Natriuretic Peptide: 168.4 pg/mL — ABNORMAL HIGH (ref 0.0–100.0)

## 2022-12-15 LAB — D-DIMER, QUANTITATIVE: D-Dimer, Quant: 1.31 ug/mL-FEU — ABNORMAL HIGH (ref 0.00–0.50)

## 2022-12-15 LAB — TROPONIN I (HIGH SENSITIVITY)
Troponin I (High Sensitivity): 13 ng/L (ref <18)
Troponin I (High Sensitivity): 13 ng/L (ref <18)

## 2022-12-15 MED ORDER — ATORVASTATIN CALCIUM 80 MG PO TABS
80.0000 mg | ORAL_TABLET | Freq: Every day | ORAL | Status: DC
  Administered 2022-12-16 – 2022-12-26 (×11): 80 mg via ORAL
  Filled 2022-12-15 (×11): qty 1

## 2022-12-15 MED ORDER — TECHNETIUM TO 99M ALBUMIN AGGREGATED
4.3200 | Freq: Once | INTRAVENOUS | Status: AC | PRN
  Administered 2022-12-15: 4.32 via INTRAVENOUS

## 2022-12-15 MED ORDER — ISOSORBIDE MONONITRATE ER 30 MG PO TB24
60.0000 mg | ORAL_TABLET | Freq: Every day | ORAL | Status: DC
  Administered 2022-12-16 – 2022-12-25 (×10): 60 mg via ORAL
  Filled 2022-12-15 (×12): qty 2

## 2022-12-15 MED ORDER — IPRATROPIUM BROMIDE 0.02 % IN SOLN
0.5000 mg | Freq: Four times a day (QID) | RESPIRATORY_TRACT | Status: DC
  Administered 2022-12-15 – 2022-12-17 (×6): 0.5 mg via RESPIRATORY_TRACT
  Filled 2022-12-15 (×4): qty 2.5

## 2022-12-15 MED ORDER — ACETAMINOPHEN 650 MG RE SUPP: 650.0000 mg | Freq: Four times a day (QID) | RECTAL | Status: DC | PRN

## 2022-12-15 MED ORDER — PREDNISONE 20 MG PO TABS
40.0000 mg | ORAL_TABLET | Freq: Every day | ORAL | Status: DC
  Administered 2022-12-16 – 2022-12-17 (×2): 40 mg via ORAL
  Filled 2022-12-15 (×2): qty 2

## 2022-12-15 MED ORDER — MEMANTINE HCL 10 MG PO TABS: 5.0000 mg | ORAL_TABLET | Freq: Two times a day (BID) | ORAL | Status: DC

## 2022-12-15 MED ORDER — ALBUTEROL SULFATE HFA 108 (90 BASE) MCG/ACT IN AERS: 2.0000 | INHALATION_SPRAY | Freq: Four times a day (QID) | RESPIRATORY_TRACT | Status: DC | PRN

## 2022-12-15 MED ORDER — AMLODIPINE BESYLATE 5 MG PO TABS
5.0000 mg | ORAL_TABLET | Freq: Every day | ORAL | Status: DC
  Administered 2022-12-16 – 2022-12-25 (×10): 5 mg via ORAL
  Filled 2022-12-15 (×10): qty 1

## 2022-12-15 MED ORDER — SODIUM CHLORIDE 0.9% FLUSH
3.0000 mL | Freq: Two times a day (BID) | INTRAVENOUS | Status: DC
  Administered 2022-12-15 – 2022-12-26 (×20): 3 mL via INTRAVENOUS

## 2022-12-15 MED ORDER — ASPIRIN 81 MG PO CHEW
81.0000 mg | CHEWABLE_TABLET | Freq: Every day | ORAL | Status: DC
  Administered 2022-12-16 – 2022-12-26 (×11): 81 mg via ORAL
  Filled 2022-12-15 (×11): qty 1

## 2022-12-15 MED ORDER — MOMETASONE FURO-FORMOTEROL FUM 200-5 MCG/ACT IN AERO
2.0000 | INHALATION_SPRAY | Freq: Two times a day (BID) | RESPIRATORY_TRACT | Status: DC
  Administered 2022-12-16 – 2022-12-26 (×20): 2 via RESPIRATORY_TRACT
  Filled 2022-12-15: qty 8.8

## 2022-12-15 MED ORDER — CARVEDILOL 3.125 MG PO TABS
6.2500 mg | ORAL_TABLET | Freq: Two times a day (BID) | ORAL | Status: DC
  Administered 2022-12-16 – 2022-12-20 (×11): 6.25 mg via ORAL
  Filled 2022-12-15 (×13): qty 2

## 2022-12-15 MED ORDER — POLYETHYLENE GLYCOL 3350 17 G PO PACK: 17.0000 g | PACK | Freq: Every day | ORAL | Status: DC | PRN

## 2022-12-15 MED ORDER — AMIODARONE HCL 100 MG PO TABS
100.0000 mg | ORAL_TABLET | Freq: Every day | ORAL | Status: DC
  Administered 2022-12-17 – 2022-12-26 (×9): 100 mg via ORAL
  Filled 2022-12-15 (×12): qty 1

## 2022-12-15 MED ORDER — IPRATROPIUM-ALBUTEROL 0.5-2.5 (3) MG/3ML IN SOLN
3.0000 mL | Freq: Once | RESPIRATORY_TRACT | Status: AC
  Administered 2022-12-15: 3 mL via RESPIRATORY_TRACT
  Filled 2022-12-15: qty 3

## 2022-12-15 MED ORDER — FUROSEMIDE 10 MG/ML IJ SOLN
40.0000 mg | Freq: Once | INTRAMUSCULAR | Status: AC
  Administered 2022-12-15: 40 mg via INTRAVENOUS
  Filled 2022-12-15: qty 4

## 2022-12-15 MED ORDER — IRBESARTAN 300 MG PO TABS: 150.0000 mg | ORAL_TABLET | Freq: Every day | ORAL | Status: DC

## 2022-12-15 MED ORDER — PANTOPRAZOLE SODIUM 40 MG PO TBEC
40.0000 mg | DELAYED_RELEASE_TABLET | Freq: Every day | ORAL | Status: DC
  Administered 2022-12-16 – 2022-12-25 (×9): 40 mg via ORAL
  Filled 2022-12-15 (×10): qty 1

## 2022-12-15 MED ORDER — CARVEDILOL 3.125 MG PO TABS
6.2500 mg | ORAL_TABLET | Freq: Two times a day (BID) | ORAL | Status: DC
  Filled 2022-12-15: qty 2

## 2022-12-15 MED ORDER — CLOPIDOGREL BISULFATE 75 MG PO TABS
75.0000 mg | ORAL_TABLET | Freq: Every day | ORAL | Status: DC
  Administered 2022-12-16 – 2022-12-26 (×11): 75 mg via ORAL
  Filled 2022-12-15 (×11): qty 1

## 2022-12-15 MED ORDER — ALBUTEROL SULFATE (2.5 MG/3ML) 0.083% IN NEBU
2.5000 mg | INHALATION_SOLUTION | RESPIRATORY_TRACT | Status: DC | PRN
  Administered 2022-12-20: 2.5 mg via RESPIRATORY_TRACT
  Filled 2022-12-15: qty 3

## 2022-12-15 MED ORDER — ACETAMINOPHEN 325 MG PO TABS
650.0000 mg | ORAL_TABLET | Freq: Four times a day (QID) | ORAL | Status: DC | PRN
  Administered 2022-12-24 – 2022-12-25 (×2): 650 mg via ORAL
  Filled 2022-12-15 (×6): qty 2

## 2022-12-15 MED ORDER — ENOXAPARIN SODIUM 60 MG/0.6ML IJ SOSY
0.5000 mg/kg | PREFILLED_SYRINGE | INTRAMUSCULAR | Status: DC
  Filled 2022-12-15: qty 0.6

## 2022-12-15 MED ORDER — FUROSEMIDE 20 MG PO TABS: 40.0000 mg | ORAL_TABLET | Freq: Every day | ORAL | Status: DC

## 2022-12-15 NOTE — ED Notes (Signed)
Patient transported to nuclear medicine.  Bland Span, RN to help transport.  No new orders at this time.

## 2022-12-15 NOTE — ED Provider Notes (Signed)
[facility] Provider Note   CSN: [id number] Arrival date & time: 12/15/22  1128     History  Chief Complaint  Patient presents with   Shortness of [place] is a 72 y.o. female.  HPI Patient presents via EMS with concern for dyspnea. Chart review the patient has a history of mild memory impairment, CHF, CKD, obesity, COPD.  She does not recall many of these medical problems, level 5 caveat secondary to her memory impairment.  Per EMS and report patient has been symptomatic for several days, has been taking her medication as directed. EMS reports patient was hypoxic, sub-90% on room air on arrival she received DuoNeb, albuterol, Solu-Medrol had some improvement in transport. Patient notes ongoing chest pain, and possibly took nitroglycerin earlier today.    Home Medications Prior to Admission medications   Medication Sig Start Date End Date Taking? Authorizing Provider  acetaminophen (TYLENOL) 325 MG tablet Take 2 tablets (650 mg total) by mouth every 6 (six) hours as needed (Mild pain). 06/09/20   Angiulli, Lavon Paganini, PA-C  albuterol (VENTOLIN HFA) 108 (90 Base) MCG/ACT inhaler Inhale 2 puffs into the lungs every 6 (six) hours as needed for wheezing. 06/09/20   Angiulli, Lavon Paganini, PA-C  amiodarone (PACERONE) 100 MG tablet Take 1 tablet (100 mg total) by mouth daily. 06/09/20   Angiulli, Lavon Paganini, PA-C  amLODipine (NORVASC) 10 MG tablet Take 1 tablet (10 mg total) by mouth daily. 06/09/20   Angiulli, Lavon Paganini, PA-C  aspirin 81 MG chewable tablet Chew 81 mg by mouth daily.    [provider]  atorvastatin (LIPITOR) 80 MG tablet Take 1 tablet (80 mg total) by mouth daily. 06/09/20   Angiulli, Lavon Paganini, PA-C  budesonide-formoterol (SYMBICORT) 160-4.5 MCG/ACT inhaler Inhale 2 puffs into the lungs 2 (two) times daily. 06/09/20   Angiulli, Lavon Paganini, PA-C  carvedilol (COREG) 6.25 MG tablet Take 1 tablet (6.25 mg total) by mouth 2  (two) times daily. 06/09/20   Angiulli, Lavon Paganini, PA-C  clopidogrel (PLAVIX) 75 MG tablet Take 1 tablet (75 mg total) by mouth daily. 06/09/20   Angiulli, Lavon Paganini, PA-C  docusate sodium (COLACE) 100 MG capsule Take 1 capsule (100 mg total) by mouth 2 (two) times daily. 06/09/20   Angiulli, Lavon Paganini, PA-C  ferrous sulfate 325 (65 FE) MG tablet Take 1 tablet (325 mg total) by mouth 3 (three) times daily with meals. 06/09/20   Angiulli, Lavon Paganini, PA-C  furosemide (LASIX) 40 MG tablet Take 1 tablet (40 mg total) by mouth daily at 2 PM. 06/09/20 07/09/20  Angiulli, Lavon Paganini, PA-C  isosorbide mononitrate (IMDUR) 60 MG 24 hr tablet Take 1 tablet (60 mg total) by mouth at bedtime. 06/09/20   Angiulli, Lavon Paganini, PA-C  memantine (NAMENDA) 5 MG tablet Take 1 tablet (5 mg at night) for 2 weeks, then increase to 1 tablet (5 mg) twice a day 07/16/22   Rondel Jumbo, PA-C  metFORMIN (GLUCOPHAGE) 500 MG tablet Take 1 tablet (500 mg total) by mouth daily with breakfast. 06/09/20 07/16/22  Angiulli, Lavon Paganini, PA-C  nitroGLYCERIN (NITROSTAT) 0.4 MG SL tablet Place 1 tablet (0.4 mg total) under the tongue every 5 (five) minutes x 3 doses as needed for chest pain. 05/25/20   Charolette Forward, MD  pantoprazole (PROTONIX) 40 MG tablet Take 1 tablet (40 mg total) by mouth at bedtime. 06/09/20   Angiulli, Lavon Paganini, PA-C  polyethylene glycol (  MIRALAX / GLYCOLAX) 17 g packet Take 17 g by mouth daily. 06/09/20   Angiulli, Lavon Paganini, PA-C  potassium chloride (KLOR-CON) 10 MEQ tablet Take 2 tablets (20 mEq total) by mouth daily. 06/09/20   Angiulli, Lavon Paganini, PA-C  traMADol (ULTRAM) 50 MG tablet Take 1 tablet (50 mg total) by mouth every 6 (six) hours as needed for moderate pain or severe pain. 06/09/20   Angiulli, Lavon Paganini, PA-C  valsartan (DIOVAN) 160 MG tablet Take 1 tablet (160 mg total) by mouth daily. 06/09/20   Angiulli, Lavon Paganini, PA-C      Allergies    Morphine and related, Penicillins, Percocet [oxycodone-acetaminophen], Ace  inhibitors, Iodinated contrast media, Iodine, Etodolac, Tramadol, and Vicodin [hydrocodone-acetaminophen]    Review of Systems   Review of Systems  All other systems reviewed and are negative.   Physical Exam Updated Vital Signs BP (!) 158/72   Pulse (!) 58   Temp 98.5 F (36.9 C) (Oral)   Resp 10   Ht '5\' 4"'$  (1.626 m)   Wt 113.4 kg   SpO2 93%   BMI 42.91 kg/m  Physical Exam Vitals and nursing note reviewed.  Constitutional:      General: She is not in acute distress.    Appearance: She is well-developed. She is obese.  HENT:     Head: Normocephalic and atraumatic.  Eyes:     Conjunctiva/sclera: Conjunctivae normal.  Cardiovascular:     Rate and Rhythm: Normal rate and regular rhythm.  Pulmonary:     Effort: Tachypnea and accessory muscle usage present.     Breath sounds: Decreased breath sounds present.  Abdominal:     General: There is no distension.  Skin:    General: Skin is warm and dry.  Neurological:     Mental Status: She is alert and oriented to person, place, and time.     Cranial Nerves: No cranial nerve deficit.  Psychiatric:        Mood and Affect: Mood normal.     ED Results / Procedures / Treatments   Labs (all labs ordered are listed, but only abnormal results are displayed) Labs Reviewed  COMPREHENSIVE METABOLIC PANEL - Abnormal; Notable for the following components:      Result Value   Glucose, Bld 111 (*)    Creatinine, Ser 1.16 (*)    Calcium 8.7 (*)    Albumin 3.2 (*)    GFR, Estimated 50 (*)    All other components within normal limits  CBC WITH DIFFERENTIAL/PLATELET - Abnormal; Notable for the following components:   RBC 3.65 (*)    Hemoglobin 10.7 (*)    HCT 34.5 (*)    RDW 15.8 (*)    All other components within normal limits  BRAIN NATRIURETIC PEPTIDE - Abnormal; Notable for the following components:   B Natriuretic Peptide 168.4 (*)    All other components within normal limits  D-DIMER, QUANTITATIVE - Abnormal; Notable for  the following components:   D-Dimer, Quant 1.31 (*)    All other components within normal limits  RESP PANEL BY RT-PCR (RSV, FLU A&B, COVID)  RVPGX2  TROPONIN I (HIGH SENSITIVITY)  TROPONIN I (HIGH SENSITIVITY)    EKG None  Radiology DG Chest Port 1 View  Result Date: 12/15/2022 CLINICAL DATA:  SOB EXAM: PORTABLE CHEST 1 VIEW COMPARISON:  August 08, 2020 FINDINGS: The cardiomediastinal silhouette is unchanged and enlarged in contour. No pleural effusion. No pneumothorax. Central vascular prominence with cephalization of the vasculature and mild  peribronchial cuffing. IMPRESSION: Constellation of findings are favored to reflect mild pulmonary edema. Electronically Signed   By: Valentino Saxon M.D.   On: 12/15/2022 12:31    Procedures Procedures    Medications Ordered in ED Medications  furosemide (LASIX) injection 40 mg (40 mg Intravenous Given 12/15/22 1323)    ED Course/ Medical Decision Making/ A&P                             Medical Decision Making Obese elderly female with some memory loss presents with dyspnea, chest pain.  Differential includes ACS, pulmonary disease, pulmonary embolism, pneumonia, bacteremia, sepsis.  Patient had initial resuscitation with EMS and transport, in the ED she continued continuous cardiac monitoring, pulse oximetry, labs, x-ray, all ordered.  Cardiac 75 sinus normal Pulse ox 97% 2 L nasal cannula abnormal   Amount and/or Complexity of Data Reviewed Independent Historian: EMS External Data Reviewed: notes.    Details: Per HPI Labs: ordered. Decision-making details documented in ED Course. Radiology: ordered and independent interpretation performed. Decision-making details documented in ED Course. ECG/medicine tests: ordered and independent interpretation performed. Decision-making details documented in ED Course.  Risk Prescription drug management.   4:08 PM Patient had been joined by her daughter, and granddaughter.  We discussed  all findings, thus far patient found to have improved breathing after multiple interventions as above, but with concern for hypertensive urgency, elevated D-dimer, plan is for nuclear medicine perfusion study which I discussed with the technologists in charge of that test.  Patient has departed for that study.  With ongoing breathing difficulty, hypertension, though she has had some improvement here, patient will require repeat evaluation, Dr. Ronnald Nian is aware.        Final Clinical Impression(s) / ED Diagnoses Final diagnoses:  Shortness of breath     Carmin Muskrat, MD 12/15/22 1609

## 2022-12-15 NOTE — ED Provider Notes (Signed)
Patient signed out to me awaiting VQ scan.  Suspicion is for shortness of breath from both COPD and may be some volume overload.  She got breathing treatments and steroids with EMS with some improvement.  She is given a dose of Lasix.  She is already had 500 cc of urine out.  Lab work thus far unremarkable.  COVID and flu test negative.  Chest x-ray looks more consistent with fluid.  VQ scan is negative for PE.  Ultimately seems like she has COPD exacerbation/some volume overload.  Will give another breathing treatment.  Will admit to hospitalist.  Oxygen is in the low 90s at rest.    This chart was dictated using voice recognition software.  Despite best efforts to proofread,  errors can occur which can change the documentation meaning.    Lennice Sites, DO 12/15/22 1728

## 2022-12-15 NOTE — ED Triage Notes (Signed)
Pt BIB [facility] from home due to chest pain and SHOB for the past couple days.  Pt SpO2 86% RA.  Pt given 1 nebulizer of albuterol and 1 duoneb.  Pt also given 120 solumedrol; 1 nitro taken at 1030.  Pt describes pain in middle of chest when breathing no radiation.  20g right hand.

## 2022-12-15 NOTE — ED Notes (Signed)
ED TO INPATIENT HANDOFF REPORT  ED Nurse Name and Phone #:   S Name/Age/Gender Sherry Fitzpatrick 72 y.o. female Room/Bed: 018C/018C  Code Status   Code Status: Full Code  Home/SNF/Other Home Patient oriented to: self, place, time, and situation Is this baseline? Yes   Triage Complete: Triage complete  Chief Complaint Acute respiratory failure with hypoxia ([place]) [J96.01]  Triage Note Pt BIB [facility] from home due to chest pain and SHOB for the past couple days.  Pt SpO2 86% RA.  Pt given 1 nebulizer of albuterol and 1 duoneb.  Pt also given 120 solumedrol; 1 nitro taken at 1030.  Pt describes pain in middle of chest when breathing no radiation.  20g right hand.    Allergies Allergies  Allergen Reactions   Morphine And Related Hives and Nausea And Vomiting   Penicillins Hives    Has patient had a PCN reaction causing immediate rash, facial/tongue/throat swelling, SOB or lightheadedness with hypotension: Yes Has patient had a PCN reaction causing severe rash involving mucus membranes or skin necrosis: No Has patient had a PCN reaction that required hospitalization: No Has patient had a PCN reaction occurring within the last 10 years: No If all of the above answers are "NO", then may proceed with Cephalosporin use.   Percocet [Oxycodone-Acetaminophen] Nausea And Vomiting   Ace Inhibitors Nausea And Vomiting and Cough   Iodinated Contrast Media Nausea And Vomiting        Iodine Itching   Etodolac Nausea And Vomiting   Tramadol Nausea And Vomiting   Vicodin [Hydrocodone-Acetaminophen] Nausea And Vomiting    Level of Care/Admitting Diagnosis ED Disposition     ED Disposition  Admit   Condition  --   Comment  Hospital Area: [place] [[id number]]  Level of Care: Telemetry Medical [104]  May place patient in observation at [facility] or [place] if equivalent level of care is available:: No  Covid Evaluation: Asymptomatic - no recent exposure (last 10  days) testing not required  Diagnosis: Acute respiratory failure with hypoxia [facility]) [id number]  Admitting Physician: Marcelyn Bruins [id number]  Attending Physician: Marcelyn Bruins [id number]          B Medical/Surgery History Past Medical History:  Diagnosis Date   Acute non-Q wave non-ST elevation myocardial infarction (NSTEMI) ([place]) 05/24/2020   Acute respiratory failure with hypoxia ([place]) 11/22/2019   AKI (acute kidney injury) ([place]) 07/03/2018   Alterations of sensations, late effect of cerebrovascular disease(438.6)    Arthritis    "all over"   Backache, unspecified    Body mass index 40.0-44.9, adult (HCC)    Carpal tunnel syndrome    Cerebral thrombosis with cerebral infarction 05/29/2020   Dizziness and giddiness    Esophageal reflux    Generalized pain    Gout attack 03/28/2020   Headache    "had alot til I had pituitary tumor removed"   Heart murmur    Hypertensive encephalopathy 03/28/2020   Mild cognitive impairment 07/16/2022   NSTEMI (non-ST elevated myocardial infarction) ([place])    Other abnormal glucose    Other and unspecified hyperlipidemia    Other malaise and fatigue    Other postprocedural status(V45.89)    Other symptoms involving cardiovascular system    Shock ([place])    Sleep apnea    "did test; they wanted me to get a CPAP but I never did get it" (04/14/2015) Does use inhaler at night (01 2021)   Stroke ([place]) 06/2003; 02/2010   "  minor; minor" ; denies residual on 04/14/2015   Unspecified disorder of the pituitary gland and its hypothalamic control    Unspecified essential hypertension    Unspecified visual disturbance    Past Surgical History:  Procedure Laterality Date   CARPAL TUNNEL RELEASE Left ~ 2014   CORONARY STENT INTERVENTION N/A 05/24/2020   Procedure: CORONARY STENT INTERVENTION;  Surgeon: Jettie Booze, MD;  Location: [facility];  Service: Cardiovascular;  Laterality: N/A;   JOINT REPLACEMENT     LEFT HEART CATH  AND CORONARY ANGIOGRAPHY N/A 05/24/2020   Procedure: LEFT HEART CATH AND CORONARY ANGIOGRAPHY;  Surgeon: Charolette Forward, MD;  Location: [facility];  Service: Cardiovascular;  Laterality: N/A;   TOTAL HIP ARTHROPLASTY Right 11/06/2010   TRANSPHENOIDAL / TRANSNASAL HYPOPHYSECTOMY / RESECTION PITUITARY TUMOR  06/13/2012   TUBAL LIGATION  1974     A IV Location/Drains/Wounds Patient Lines/Drains/Airways Status     Active Line/Drains/Airways     Name Placement date Placement time Site Days   Peripheral IV 12/15/22 20 G Posterior;Right Hand 12/15/22  1111  Hand  less than 1   Peripheral IV 12/15/22 20 G Right Antecubital 12/15/22  1142  Antecubital  less than 1   External Urinary Catheter 12/15/22  1324  --  less than 1            Intake/Output Last 24 hours No intake or output data in the 24 hours ending 12/15/22 1905  Labs/Imaging Results for orders placed or performed during the hospital encounter of 12/15/22 (from the past 48 hour(s))  Comprehensive metabolic panel     Status: Abnormal   Collection Time: 12/15/22 11:41 AM  Result Value Ref Range   Sodium 139 135 - 145 mmol/L   Potassium 4.0 3.5 - 5.1 mmol/L   Chloride 101 98 - 111 mmol/L   CO2 29 22 - 32 mmol/L   Glucose, Bld 111 (H) 70 - 99 mg/dL    Comment: Glucose reference range applies only to samples taken after fasting for at least 8 hours.   BUN 15 8 - 23 mg/dL   Creatinine, Ser 1.16 (H) 0.44 - 1.00 mg/dL   Calcium 8.7 (L) 8.9 - 10.3 mg/dL   Total Protein 7.6 6.5 - 8.1 g/dL   Albumin 3.2 (L) 3.5 - 5.0 g/dL   AST 24 15 - 41 U/L   ALT 30 0 - 44 U/L   Alkaline Phosphatase 56 38 - 126 U/L   Total Bilirubin 0.9 0.3 - 1.2 mg/dL   GFR, Estimated 50 (L) >60 mL/min    Comment: (NOTE) Calculated using the CKD-EPI Creatinine Equation (2021)    Anion gap 9 5 - 15    Comment: Performed at [facility], [place] [address], [place], [place] [address]  CBC with Differential/Platelet     Status: Abnormal   Collection  Time: 12/15/22 11:41 AM  Result Value Ref Range   WBC 7.0 4.0 - 10.5 K/uL   RBC 3.65 (L) 3.87 - 5.11 MIL/uL   Hemoglobin 10.7 (L) 12.0 - 15.0 g/dL   HCT 34.5 (L) 36.0 - 46.0 %   MCV 94.5 80.0 - 100.0 fL   MCH 29.3 26.0 - 34.0 pg   MCHC 31.0 30.0 - 36.0 g/dL   RDW 15.8 (H) 11.5 - 15.5 %   Platelets 297 150 - 400 K/uL   nRBC 0.0 0.0 - 0.2 %   Neutrophils Relative % 59 %   Neutro Abs 4.2 1.7 -  7.7 K/uL   Lymphocytes Relative 26 %   Lymphs Abs 1.8 0.7 - 4.0 K/uL   Monocytes Relative 10 %   Monocytes Absolute 0.7 0.1 - 1.0 K/uL   Eosinophils Relative 4 %   Eosinophils Absolute 0.3 0.0 - 0.5 K/uL   Basophils Relative 1 %   Basophils Absolute 0.0 0.0 - 0.1 K/uL   Immature Granulocytes 0 %   Abs Immature Granulocytes 0.02 0.00 - 0.07 K/uL    Comment: Performed at [facility] [address], [place], [place] [address]  Brain natriuretic peptide     Status: Abnormal   Collection Time: 12/15/22 11:41 AM  Result Value Ref Range   B Natriuretic Peptide 168.4 (H) 0.0 - 100.0 pg/mL    Comment: Performed at [facility] [address] [place], [place] [address]  D-dimer, quantitative     Status: Abnormal   Collection Time: 12/15/22 11:41 AM  Result Value Ref Range   D-Dimer, Quant 1.31 (H) 0.00 - 0.50 ug/mL-FEU    Comment: (NOTE) At the manufacturer cut-off value of 0.5 g/mL FEU, this assay has a negative predictive value of 95-100%.This assay is intended for use in conjunction with a clinical pretest probability (PTP) assessment model to exclude pulmonary embolism (PE) and deep venous thrombosis (DVT) in outpatients suspected of PE or DVT. Results should be correlated with clinical presentation. Performed at [facility], [place] [address] [place], [place] [address]   Resp panel by RT-PCR (RSV, Flu A&B, Covid) Anterior Nasal Swab     Status: None   Collection Time: 12/15/22 11:45 AM   Specimen: Anterior Nasal Swab  Result Value Ref Range   SARS Coronavirus 2 by  RT PCR NEGATIVE NEGATIVE   Influenza A by PCR NEGATIVE NEGATIVE   Influenza B by PCR NEGATIVE NEGATIVE    Comment: (NOTE) The Xpert Xpress SARS-CoV-2/FLU/RSV plus assay is intended as an aid in the diagnosis of influenza from Nasopharyngeal swab specimens and should not be used as a sole basis for treatment. Nasal washings and aspirates are unacceptable for Xpert Xpress SARS-CoV-2/FLU/RSV testing.  Fact Sheet for Patients: EntrepreneurPulse.com.au  Fact Sheet for Healthcare Providers: IncredibleEmployment.be  This test is not yet approved or cleared by the [place] FDA and has been authorized for detection and/or diagnosis of SARS-CoV-2 by FDA under an Emergency Use Authorization (EUA). This EUA will remain in effect (meaning this test can be used) for the duration of the COVID-19 declaration under Section 564(b)(1) of the Act, 21 U.S.C. section 360bbb-3(b)(1), unless the authorization is terminated or revoked.     Resp Syncytial Virus by PCR NEGATIVE NEGATIVE    Comment: (NOTE) Fact Sheet for Patients: EntrepreneurPulse.com.au  Fact Sheet for Healthcare Providers: IncredibleEmployment.be  This test is not yet approved or cleared by the [place] FDA and has been authorized for detection and/or diagnosis of SARS-CoV-2 by FDA under an Emergency Use Authorization (EUA). This EUA will remain in effect (meaning this test can be used) for the duration of the COVID-19 declaration under Section 564(b)(1) of the Act, 21 U.S.C. section 360bbb-3(b)(1), unless the authorization is terminated or revoked.  Performed at [facility], [place] [address], [place], [place] [address]   Troponin I (High Sensitivity)     Status: None   Collection Time: 12/15/22  2:53 PM  Result Value Ref Range   Troponin I (High Sensitivity) 13 <18 ng/L    Comment: (NOTE) Elevated high sensitivity troponin I (hsTnI) values  and significant  changes across serial measurements may suggest ACS but many other  chronic and acute conditions are known to elevate hsTnI results.  Refer to the "Links" section for chest pain algorithms and additional  guidance. Performed at [facility], [place] [address], [place], [place] [address]   Troponin I (High Sensitivity)     Status: None   Collection Time: 12/15/22  4:49 PM  Result Value Ref Range   Troponin I (High Sensitivity) 13 <18 ng/L    Comment: (NOTE) Elevated high sensitivity troponin I (hsTnI) values and significant  changes across serial measurements may suggest ACS but many other  chronic and acute conditions are known to elevate hsTnI results.  Refer to the "Links" section for chest pain algorithms and additional  guidance. Performed at [facility], [place] [address], [place], [place] [address]    NM PULMONARY VENT AND PERF (V/Q Scan)  Result Date: 12/15/2022 CLINICAL DATA:  Shortness of breath and dizziness EXAM: NUCLEAR MEDICINE PERFUSION LUNG SCAN TECHNIQUE: Perfusion images were obtained in multiple projections after intravenous injection of radiopharmaceutical. Ventilation scans intentionally deferred if perfusion scan and chest x-ray adequate for interpretation during COVID 19 epidemic. RADIOPHARMACEUTICALS:  4.3 mCi Tc-9mMAA IV COMPARISON:  Chest x-ray earlier 12/15/2022 FINDINGS: No significant perfusion defects.  Enlarged heart. IMPRESSION: Negative perfusion lung scan Electronically Signed   By: AJill SideM.D.   On: 12/15/2022 17:07   DG Chest Port 1 View  Result Date: 12/15/2022 CLINICAL DATA:  SOB EXAM: PORTABLE CHEST 1 VIEW COMPARISON:  August 08, 2020 FINDINGS: The cardiomediastinal silhouette is unchanged and enlarged in contour. No pleural effusion. No pneumothorax. Central vascular prominence with cephalization of the vasculature and mild peribronchial cuffing. IMPRESSION: Constellation of findings are favored to reflect mild pulmonary  edema. Electronically Signed   By: SValentino SaxonM.D.   On: 12/15/2022 12:31    Pending Labs Unresulted Labs (From admission, onward)     Start     Ordered   12/22/22 0500  Creatinine, serum  (enoxaparin (LOVENOX)    CrCl >/= 30 ml/min)  Weekly,   R     Comments: while on enoxaparin therapy    12/15/22 1847   12/16/22 0500  Comprehensive metabolic panel  Tomorrow morning,   R        12/15/22 1847   12/16/22 0500  CBC  Tomorrow morning,   R        12/15/22 1847   12/15/22 1841  Respiratory (~20 pathogens) panel by PCR  (COPD / Pneumonia / Cellulitis / Lower Extremity Wound)  Add-on,   AD        12/15/22 1847            Vitals/Pain Today's Vitals   12/15/22 1300 12/15/22 1530 12/15/22 1531 12/15/22 1800  BP: (!) 158/72 (!) 157/62  (!) 152/77  Pulse: (!) 58 62  63  Resp: '10 15  19  '$ Temp:   98.5 F (36.9 C)   TempSrc:   Oral   SpO2: 93% 92%  93%  Weight:      Height:      PainSc:        Isolation Precautions Droplet precaution  Medications Medications  aspirin chewable tablet 81 mg (has no administration in time range)  amiodarone (PACERONE) tablet 100 mg (has no administration in time range)  atorvastatin (LIPITOR) tablet 80 mg (has no administration in time range)  memantine (NAMENDA) tablet 5 mg (has no administration in  time range)  pantoprazole (PROTONIX) EC tablet 40 mg (has no administration in time range)  mometasone-formoterol (DULERA) 200-5 MCG/ACT inhaler 2 puff (has no administration in time range)  predniSONE (DELTASONE) tablet 40 mg (has no administration in time range)  ipratropium (ATROVENT) nebulizer solution 0.5 mg (has no administration in time range)  albuterol (PROVENTIL) (2.5 MG/3ML) 0.083% nebulizer solution 2.5 mg (has no administration in time range)  enoxaparin (LOVENOX) injection 57.5 mg (40 mg Subcutaneous Not Given 12/15/22 1853)  sodium chloride flush (NS) 0.9 % injection 3 mL (has no administration in time range)  acetaminophen  (TYLENOL) tablet 650 mg (has no administration in time range)    Or  acetaminophen (TYLENOL) suppository 650 mg (has no administration in time range)  polyethylene glycol (MIRALAX / GLYCOLAX) packet 17 g (has no administration in time range)  furosemide (LASIX) injection 40 mg (40 mg Intravenous Given 12/15/22 1323)  technetium albumin aggregated (MAA) injection solution 123456 millicurie (123456 millicuries Intravenous Contrast Given 12/15/22 1606)  ipratropium-albuterol (DUONEB) 0.5-2.5 (3) MG/3ML nebulizer solution 3 mL (3 mLs Nebulization Given 12/15/22 1728)    Mobility walks with person assist     Focused Assessments    R Recommendations: See Admitting Provider Note  Report given to:   Additional Notes:

## 2022-12-15 NOTE — ED Notes (Signed)
Pt was able to walk to the bathroom with minimal assistance with staff. Pt is back on monitor and perwick is back on pt at this time.

## 2022-12-15 NOTE — H&P (Signed)
History and Physical   Sherry Fitzpatrick [id number] DOB: 08-10-1951 DOA: 12/15/2022  PCP: Glenis Smoker, MD   Patient coming from: Home  Chief Complaint: Shortness of breath  HPI: Sherry Fitzpatrick is a 72 y.o. female with medical history significant of cognitive impairment, diabetes, diastolic CHF, stroke, A-fib, OSA, GERD, anemia, obesity, hyperlipidemia, CKD 3, asthma, anxiety, depression presenting with shortness of breath.  History obtained with assistance of chart review and family.  Patient has had several days of shortness of breath.  EMS was called due to worsening symptoms today noted to be saturating in the 80s on room air.  Some improvement following Solu-Medrol, DuoNeb, albuterol and route.  Patient denies fevers, chills, chest pain, abdominal pain, constipation, diarrhea, nausea, vomiting.  ED Course: Vital signs in the ED significant for blood pressure in the 150s to 190s, heart rate in the 50s to 60s.  Lab workup included CMP with creatinine stable 1.16, calcium 8.7, albumin 3.2.  CBC with hemoglobin stable at 10.7.  Troponin negative x 2.  BMP borderline at 168.  D-dimer elevated 1.31.  Respiratory panel for flu COVID RSV negative.  Chest x-ray showing findings favoring pulmonary edema.  VQ scan negative.  Patient received Lasix and DuoNeb in the ED.  Review of Systems: As per HPI otherwise all other systems reviewed and are negative.  Past Medical History:  Diagnosis Date   Acute non-Q wave non-ST elevation myocardial infarction (NSTEMI) ([place]) 05/24/2020   Acute respiratory failure with hypoxia ([place]) 11/22/2019   AKI (acute kidney injury) ([place]) 07/03/2018   Alterations of sensations, late effect of cerebrovascular disease(438.6)    Arthritis    "all over"   Backache, unspecified    Body mass index 40.0-44.9, adult (HCC)    Carpal tunnel syndrome    Cerebral thrombosis with cerebral infarction 05/29/2020   Dizziness and giddiness    Esophageal reflux     Generalized pain    Gout attack 03/28/2020   Headache    "had alot til I had pituitary tumor removed"   Heart murmur    Hypertensive encephalopathy 03/28/2020   Mild cognitive impairment 07/16/2022   NSTEMI (non-ST elevated myocardial infarction) ([place])    Other abnormal glucose    Other and unspecified hyperlipidemia    Other malaise and fatigue    Other postprocedural status(V45.89)    Other symptoms involving cardiovascular system    Shock ([place])    Sleep apnea    "did test; they wanted me to get a CPAP but I never did get it" (04/14/2015) Does use inhaler at night (01 2021)   Stroke ([place]) 06/2003; 02/2010   "minor; minor" ; denies residual on 04/14/2015   Unspecified disorder of the pituitary gland and its hypothalamic control    Unspecified essential hypertension    Unspecified visual disturbance     Past Surgical History:  Procedure Laterality Date   CARPAL TUNNEL RELEASE Left ~ 2014   CORONARY STENT INTERVENTION N/A 05/24/2020   Procedure: CORONARY STENT INTERVENTION;  Surgeon: Jettie Booze, MD;  Location: [facility];  Service: Cardiovascular;  Laterality: N/A;   JOINT REPLACEMENT     LEFT HEART CATH AND CORONARY ANGIOGRAPHY N/A 05/24/2020   Procedure: LEFT HEART CATH AND CORONARY ANGIOGRAPHY;  Surgeon: Charolette Forward, MD;  Location: [facility];  Service: Cardiovascular;  Laterality: N/A;   TOTAL HIP ARTHROPLASTY Right 11/06/2010   TRANSPHENOIDAL / TRANSNASAL HYPOPHYSECTOMY / RESECTION PITUITARY TUMOR  06/13/2012   TUBAL LIGATION  1974  Social History  reports that she has never smoked. She has never used smokeless tobacco. She reports current alcohol use. She reports that she does not use drugs.  Allergies  Allergen Reactions   Morphine And Related Hives and Nausea And Vomiting   Penicillins Hives    Has patient had a PCN reaction causing immediate rash, facial/tongue/throat swelling, SOB or lightheadedness with hypotension: Yes Has patient had a  PCN reaction causing severe rash involving mucus membranes or skin necrosis: No Has patient had a PCN reaction that required hospitalization: No Has patient had a PCN reaction occurring within the last 10 years: No If all of the above answers are "NO", then may proceed with Cephalosporin use.   Percocet [Oxycodone-Acetaminophen] Nausea And Vomiting   Ace Inhibitors Nausea And Vomiting and Cough   Iodinated Contrast Media Nausea And Vomiting        Iodine Itching   Etodolac Nausea And Vomiting   Tramadol Nausea And Vomiting   Vicodin [Hydrocodone-Acetaminophen] Nausea And Vomiting    Family History  Problem Relation Age of Onset   Heart failure Mother    Hypertension Mother    Diabetes Father    Hypertension Sister    Heart failure Sister    Hypertension Daughter   Reviewed on admission  Prior to Admission medications   Medication Sig Start Date End Date Taking? Authorizing Provider  acetaminophen (TYLENOL) 325 MG tablet Take 2 tablets (650 mg total) by mouth every 6 (six) hours as needed (Mild pain). 06/09/20   Angiulli, Lavon Paganini, PA-C  albuterol (VENTOLIN HFA) 108 (90 Base) MCG/ACT inhaler Inhale 2 puffs into the lungs every 6 (six) hours as needed for wheezing. 06/09/20   Angiulli, Lavon Paganini, PA-C  amiodarone (PACERONE) 100 MG tablet Take 1 tablet (100 mg total) by mouth daily. 06/09/20   Angiulli, Lavon Paganini, PA-C  amLODipine (NORVASC) 10 MG tablet Take 1 tablet (10 mg total) by mouth daily. 06/09/20   Angiulli, Lavon Paganini, PA-C  aspirin 81 MG chewable tablet Chew 81 mg by mouth daily.    [provider]  atorvastatin (LIPITOR) 80 MG tablet Take 1 tablet (80 mg total) by mouth daily. 06/09/20   Angiulli, Lavon Paganini, PA-C  budesonide-formoterol (SYMBICORT) 160-4.5 MCG/ACT inhaler Inhale 2 puffs into the lungs 2 (two) times daily. 06/09/20   Angiulli, Lavon Paganini, PA-C  carvedilol (COREG) 6.25 MG tablet Take 1 tablet (6.25 mg total) by mouth 2 (two) times daily. 06/09/20   Angiulli, Lavon Paganini, PA-C  clopidogrel (PLAVIX) 75 MG tablet Take 1 tablet (75 mg total) by mouth daily. 06/09/20   Angiulli, Lavon Paganini, PA-C  docusate sodium (COLACE) 100 MG capsule Take 1 capsule (100 mg total) by mouth 2 (two) times daily. 06/09/20   Angiulli, Lavon Paganini, PA-C  ferrous sulfate 325 (65 FE) MG tablet Take 1 tablet (325 mg total) by mouth 3 (three) times daily with meals. 06/09/20   Angiulli, Lavon Paganini, PA-C  furosemide (LASIX) 40 MG tablet Take 1 tablet (40 mg total) by mouth daily at 2 PM. 06/09/20 07/09/20  Angiulli, Lavon Paganini, PA-C  isosorbide mononitrate (IMDUR) 60 MG 24 hr tablet Take 1 tablet (60 mg total) by mouth at bedtime. 06/09/20   Angiulli, Lavon Paganini, PA-C  memantine (NAMENDA) 5 MG tablet Take 1 tablet (5 mg at night) for 2 weeks, then increase to 1 tablet (5 mg) twice a day 07/16/22   Rondel Jumbo, PA-C  metFORMIN (GLUCOPHAGE) 500 MG tablet Take 1 tablet (500 mg  total) by mouth daily with breakfast. 06/09/20 07/16/22  Angiulli, Lavon Paganini, PA-C  nitroGLYCERIN (NITROSTAT) 0.4 MG SL tablet Place 1 tablet (0.4 mg total) under the tongue every 5 (five) minutes x 3 doses as needed for chest pain. 05/25/20   Charolette Forward, MD  pantoprazole (PROTONIX) 40 MG tablet Take 1 tablet (40 mg total) by mouth at bedtime. 06/09/20   Angiulli, Lavon Paganini, PA-C  polyethylene glycol (MIRALAX / GLYCOLAX) 17 g packet Take 17 g by mouth daily. 06/09/20   Angiulli, Lavon Paganini, PA-C  potassium chloride (KLOR-CON) 10 MEQ tablet Take 2 tablets (20 mEq total) by mouth daily. 06/09/20   Angiulli, Lavon Paganini, PA-C  traMADol (ULTRAM) 50 MG tablet Take 1 tablet (50 mg total) by mouth every 6 (six) hours as needed for moderate pain or severe pain. 06/09/20   Angiulli, Lavon Paganini, PA-C  valsartan (DIOVAN) 160 MG tablet Take 1 tablet (160 mg total) by mouth daily. 06/09/20   Cathlyn Parsons, PA-C    Physical Exam: Vitals:   12/15/22 1300 12/15/22 1530 12/15/22 1531 12/15/22 1800  BP: (!) 158/72 (!) 157/62  (!) 152/77  Pulse: (!) 58 62  63   Resp: '10 15  19  '$ Temp:   98.5 F (36.9 C)   TempSrc:   Oral   SpO2: 93% 92%  93%  Weight:      Height:        Physical Exam Constitutional:      General: She is not in acute distress.    Appearance: Normal appearance. She is obese.  HENT:     Head: Normocephalic and atraumatic.     Mouth/Throat:     Mouth: Mucous membranes are moist.     Pharynx: Oropharynx is clear.  Eyes:     Extraocular Movements: Extraocular movements intact.     Pupils: Pupils are equal, round, and reactive to light.  Cardiovascular:     Rate and Rhythm: Normal rate and regular rhythm.     Pulses: Normal pulses.     Heart sounds: Normal heart sounds.  Pulmonary:     Effort: Pulmonary effort is normal. No respiratory distress.     Breath sounds: Wheezing (trace) and rales (Trace) present.  Abdominal:     General: Bowel sounds are normal. There is no distension.     Palpations: Abdomen is soft.     Tenderness: There is no abdominal tenderness.  Musculoskeletal:        General: No swelling or deformity.  Skin:    General: Skin is warm and dry.  Neurological:     General: No focal deficit present.     Mental Status: Mental status is at baseline.    Labs on Admission: I have personally reviewed following labs and imaging studies  CBC: Recent Labs  Lab 12/15/22 1141  WBC 7.0  NEUTROABS 4.2  HGB 10.7*  HCT 34.5*  MCV 94.5  PLT [id number]    Basic Metabolic Panel: Recent Labs  Lab 12/15/22 1141  NA 139  K 4.0  CL 101  CO2 29  GLUCOSE 111*  BUN 15  CREATININE 1.16*  CALCIUM 8.7*    GFR: Estimated Creatinine Clearance: 53.3 mL/min (A) (by C-G formula based on SCr of 1.16 mg/dL (H)).  Liver Function Tests: Recent Labs  Lab 12/15/22 1141  AST 24  ALT 30  ALKPHOS 56  BILITOT 0.9  PROT 7.6  ALBUMIN 3.2*    Urine analysis:    Component Value Date/Time   COLORURINE  AMBER (A) 05/28/2020 0834   APPEARANCEUR TURBID (A) 05/28/2020 0834   LABSPEC 1.021 05/28/2020 0834   PHURINE 5.0  05/28/2020 0834   GLUCOSEU NEGATIVE 05/28/2020 0834   HGBUR LARGE (A) 05/28/2020 0834   BILIRUBINUR NEGATIVE 05/28/2020 0834   KETONESUR NEGATIVE 05/28/2020 0834   PROTEINUR 100 (A) 05/28/2020 0834   UROBILINOGEN 0.2 04/14/2015 1451   NITRITE NEGATIVE 05/28/2020 0834   LEUKOCYTESUR MODERATE (A) 05/28/2020 0834    Radiological Exams on Admission: NM PULMONARY VENT AND PERF (V/Q Scan)  Result Date: 12/15/2022 CLINICAL DATA:  Shortness of breath and dizziness EXAM: NUCLEAR MEDICINE PERFUSION LUNG SCAN TECHNIQUE: Perfusion images were obtained in multiple projections after intravenous injection of radiopharmaceutical. Ventilation scans intentionally deferred if perfusion scan and chest x-ray adequate for interpretation during COVID 19 epidemic. RADIOPHARMACEUTICALS:  4.3 mCi Tc-24mMAA IV COMPARISON:  Chest x-ray earlier 12/15/2022 FINDINGS: No significant perfusion defects.  Enlarged heart. IMPRESSION: Negative perfusion lung scan Electronically Signed   By: AJill SideM.D.   On: 12/15/2022 17:07   DG Chest Port 1 View  Result Date: 12/15/2022 CLINICAL DATA:  SOB EXAM: PORTABLE CHEST 1 VIEW COMPARISON:  August 08, 2020 FINDINGS: The cardiomediastinal silhouette is unchanged and enlarged in contour. No pleural effusion. No pneumothorax. Central vascular prominence with cephalization of the vasculature and mild peribronchial cuffing. IMPRESSION: Constellation of findings are favored to reflect mild pulmonary edema. Electronically Signed   By: SValentino SaxonM.D.   On: 12/15/2022 12:31    EKG: Pending.  Assessment/Plan Active Problems:   OSA (obstructive sleep apnea)   Hyperlipidemia   Essential hypertension   Asthma   Morbid obesity (HCC)   CKD (chronic kidney disease), stage III (HCC)   COPD (chronic obstructive pulmonary disease) (HCC)   Normocytic anemia   History of CVA (cerebrovascular accident)   Chronic systolic congestive heart failure (HCC)   Status post coronary artery  stent placement   PAF (paroxysmal atrial fibrillation) (HCC)   Type 2 diabetes mellitus with hyperglycemia, with long-term current use of insulin (HCC)   Anxiety and depression   Esophageal reflux   Acute respiratory failure with hypoxia (HCC)   Acute respiratory failure with hypoxia COPD/asthma exacerbation Mild volume overload, ?Mild CHF exacerbation > Patient presenting with ongoing shortness of breath.  Initially saturating the 80s on room air requiring supplemental oxygen but did have some improvement after Solu-Medrol, albuterol, DuoNeb and route via EMS. > Workup in the ED included chest x-ray with evidence of pulmonary edema, negative VQ scan for PE.  Troponin negative x 2.  BNP mildly elevated at 160. > Patient exam and workup consistent with primarily COPD exacerbation with evidence of mild volume overload.  Patient received additional DuoNeb in the ED as well as a dose of Lasix. > Now saturating okay on room air at rest, patient will need to be ambulated at some point to monitor for desaturations on exertion. > Negative for flu COVID and RSV in the ED.  Will add on full respiratory viral panel to evaluate for trigger. - Monitor in telemetry unit - Daily steroids - Scheduled Atrovent while awake - As needed albuterol - No further diuretic at this time - Follow-up RVP - Continue home Symbicort - Continue home Lasix  Diastolic CHF > Last echo in the system was in 2021 with EF 60-65%, indeterminate diastolic function. Some evidence of volume overload and did receive a dose of Lasix in the ED as above. - Continue home carvedilol, valsartan - Will switch back  to home Lasix for now - Monitor daily weights and I's and O's  Paroxysmal A-fib - Continue home amiodarone, carvedilol - Not currently on anticoagulation.  CAD > Status post stenting in 2021. - Continue home aspirin, Plavix - Continue home atorvastatin, carvedilol, Imdur  Hypertension - Continue home carvedilol,  amlodipine, Lasix, valsartan  Mild  cognitive impairment > In process of starting Namenda appears  GERD - Continue home PPI  Anemia > Hemoglobin stable at 10.7 - Trend CBC  Hyperlipidemia - Continue home atorvastatin  CKD 3 > Creatinine remains stable at 1.16 - Trend renal function and electrolytes   Anxiety Depression - Listed in chart but not currently on medications for this  Obesity - Noted  DVT prophylaxis: Lovenox Code Status:   DNR, Okay with trial of intubation, family aware Family Communication:  Updated at beside Disposition Plan:   Patient is from:  Home  Anticipated DC to:  Home  Anticipated DC date:  1 to 3 days  Anticipated DC barriers: None  Consults called:  None Admission status:  Sedation, telemetry  Severity of Illness: The appropriate patient status for this patient is OBSERVATION. Observation status is judged to be reasonable and necessary in order to provide the required intensity of service to ensure the patient's safety. The patient's presenting symptoms, physical exam findings, and initial radiographic and laboratory data in the context of their medical condition is felt to place them at decreased risk for further clinical deterioration. Furthermore, it is anticipated that the patient will be medically stable for discharge from the hospital within 2 midnights of admission.    Marcelyn Bruins MD [facility]  How to contact the [facility] Attending or Consulting provider [place] or covering provider during after hours [place], for this patient?   Check the care team in [facility] and look for a) attending/consulting [facility] provider listed and b) the [facility] team listed Log into [email or web address] and use 's universal password to access. If you do not have the password, please contact the hospital operator. Locate the [facility] provider you are looking for under [facility] and page to a number that you can be directly reached. If you still have  difficulty reaching the provider, please page the [facility] (Director on Call) for the Hospitalists listed on amion for assistance.  12/15/2022, 6:51 PM

## 2022-12-16 ENCOUNTER — Observation Stay (HOSPITAL_COMMUNITY): Payer: 59

## 2022-12-16 DIAGNOSIS — I5031 Acute diastolic (congestive) heart failure: Secondary | ICD-10-CM

## 2022-12-16 DIAGNOSIS — J9601 Acute respiratory failure with hypoxia: Secondary | ICD-10-CM | POA: Diagnosis not present

## 2022-12-16 LAB — RESPIRATORY PANEL BY PCR

## 2022-12-16 LAB — ECHOCARDIOGRAM COMPLETE
AR max vel: 1.4 cm2
AV Area VTI: 1.55 cm2
AV Area mean vel: 1.35 cm2
AV Mean grad: 21.3 mmHg
AV Peak grad: 38 mmHg
Ao pk vel: 3.08 m/s
Area-P 1/2: 3.17 cm2
Height: 64 in
P 1/2 time: 782 msec
S' Lateral: 2.95 cm
Weight: 4052.94 oz

## 2022-12-16 LAB — COMPREHENSIVE METABOLIC PANEL
ALT: 28 U/L (ref 0–44)
AST: 17 U/L (ref 15–41)
Albumin: 3.1 g/dL — ABNORMAL LOW (ref 3.5–5.0)
Alkaline Phosphatase: 60 U/L (ref 38–126)
Anion gap: 9 (ref 5–15)
BUN: 29 mg/dL — ABNORMAL HIGH (ref 8–23)
CO2: 28 mmol/L (ref 22–32)
Calcium: 8.4 mg/dL — ABNORMAL LOW (ref 8.9–10.3)
Chloride: 102 mmol/L (ref 98–111)
Creatinine, Ser: 1.71 mg/dL — ABNORMAL HIGH (ref 0.44–1.00)
GFR, Estimated: 31 mL/min — ABNORMAL LOW (ref >60)
Glucose, Bld: 155 mg/dL — ABNORMAL HIGH (ref 70–99)
Potassium: 4 mmol/L (ref 3.5–5.1)
Sodium: 139 mmol/L (ref 135–145)
Total Bilirubin: 0.4 mg/dL (ref 0.3–1.2)
Total Protein: 7.3 g/dL (ref 6.5–8.1)

## 2022-12-16 LAB — CBC
HCT: 33.1 % — ABNORMAL LOW (ref 36.0–46.0)
Hemoglobin: 10.7 g/dL — ABNORMAL LOW (ref 12.0–15.0)
MCH: 30 pg (ref 26.0–34.0)
MCHC: 32.3 g/dL (ref 30.0–36.0)
MCV: 92.7 fL (ref 80.0–100.0)
Platelets: 281 10*3/uL (ref 150–400)
RBC: 3.57 MIL/uL — ABNORMAL LOW (ref 3.87–5.11)
RDW: 15.9 % — ABNORMAL HIGH (ref 11.5–15.5)
WBC: 12 10*3/uL — ABNORMAL HIGH (ref 4.0–10.5)
nRBC: 0 % (ref 0.0–0.2)

## 2022-12-16 MED ORDER — FUROSEMIDE 10 MG/ML IJ SOLN
40.0000 mg | Freq: Every day | INTRAMUSCULAR | Status: DC
  Administered 2022-12-16: 40 mg via INTRAVENOUS
  Filled 2022-12-16: qty 4

## 2022-12-16 MED ORDER — IPRATROPIUM-ALBUTEROL 0.5-2.5 (3) MG/3ML IN SOLN
3.0000 mL | Freq: Four times a day (QID) | RESPIRATORY_TRACT | Status: DC
  Administered 2022-12-16 – 2022-12-17 (×5): 3 mL via RESPIRATORY_TRACT
  Filled 2022-12-16 (×5): qty 3

## 2022-12-16 MED ORDER — ENOXAPARIN SODIUM 60 MG/0.6ML IJ SOSY
60.0000 mg | PREFILLED_SYRINGE | INTRAMUSCULAR | Status: DC
  Administered 2022-12-16: 60 mg via SUBCUTANEOUS

## 2022-12-16 NOTE — Progress Notes (Signed)
PROGRESS NOTE    Sherry Fitzpatrick  [id number] DOB: May 14, 1951 DOA: 12/15/2022 PCP: Glenis Smoker, MD  71/F w/cognitive impairment, diabetes, diastolic CHF, stroke, A-fib, OSA, GERD, anemia, obesity, hyperlipidemia, CKD 3, asthma, anxiety, depression presenting with shortness of breath x few days. EMS was called noted to be saturating in the 80s on room air.  Some improvement following Solu-Medrol, DuoNeb, albuterol and route. In ED,creatinine stable 1.16, Troponin negative x 2. D-dimer elevated 1.31.  Respiratory panel for flu COVID RSV negative.  Chest x-ray showing findings favoring pulmonary edema.  VQ scan negative.  Patient received Lasix and DuoNeb in the ED  Subjective: Feels better, breathing is improving  Assessment and Plan:  Acute respiratory failure with hypoxia COPD/asthma exacerbation Mild volume overload, ?Mild CHF exacerbation -Suspect this is a combination of COPD exacerbation and mild volume overload as well -Repeat IV Lasix today, follow-up echocardiogram -Continue prednisone, add scheduled DuoNebs -Flu, COVID PCR, respiratory virus panel is negative, continue Symbicort and PRN albuterol   Acute on chronic diastolic CHF > Last echo in the system was in 2021 with EF 60-65%, indeterminate diastolic function. -Appears volume overloaded, continue IV Lasix 1 more day, continue carvedilol -Creatinine has trended up we will hold valsartan  AKI -Likely secondary to cardiorenal, ARB use -Hold valsartan, BMP in a.m.   Paroxysmal A-fib - Continue home amiodarone, carvedilol -In sinus rhythm now, not on anticoagulation at baseline, likely secondary to DAPT therapy   CAD > Status post stenting in 2021. - Continue aspirin, Plavix - Continue atorvastatin, carvedilol, Imdur   Hypertension - Continue home carvedilol, amlodipine, Lasix,   Mild  cognitive impairment -Apparently started on Namenda recently, patient declines using this   GERD - Continue home PPI    Anemia > Hemoglobin stable at 10.7 - Trend CBC   Hyperlipidemia - Continue home atorvastatin   CKD 3 > Creatinine remains stable at 1.16 - Trend renal function and electrolytes   Anxiety Depression - Listed in chart but not currently on medications for this   Obesity - Noted   DVT prophylaxis:      Lovenox Code Status:              DNR, Okay with trial of intubation Family Communication:     None present Disposition Plan: Home likely 1 to 2 days  Consultants:    Procedures:   Antimicrobials:    Objective: Vitals:   12/16/22 0725 12/16/22 0800 12/16/22 0932 12/16/22 0934  BP: (!) 166/69 (!) 135/58    Pulse: 69 61    Resp: 18     Temp: 98.1 F (36.7 C)     TempSrc: Oral     SpO2: 94% 98% 97% 97%  Weight:      Height:        Intake/Output Summary (Last 24 hours) at 12/16/2022 1100 Last data filed at 12/16/2022 0004 Gross per 24 hour  Intake 120 ml  Output 2 ml  Net 118 ml   Filed Weights   12/15/22 1135 12/15/22 2123 12/16/22 0002  Weight: 113.4 kg 114.9 kg 114.9 kg    Examination:  General exam: Appears calm and comfortable, AAOx3, mild cognitive deficits Respiratory system: Clear to auscultation Cardiovascular system: S1 & S2 heard, RRR.  Abd: nondistended, soft and nontender.Normal bowel sounds heard. Central nervous system: Alert and oriented. No focal neurological deficits. Extremities: no edema Skin: No rashes Psychiatry:  Mood & affect appropriate.     Data Reviewed:   CBC: Recent Labs  Lab  12/15/22 1141 12/16/22 0720  WBC 7.0 12.0*  NEUTROABS 4.2  --   HGB 10.7* 10.7*  HCT 34.5* 33.1*  MCV 94.5 92.7  PLT 297 [id number]   Basic Metabolic Panel: Recent Labs  Lab 12/15/22 1141 12/16/22 0720  NA 139 139  K 4.0 4.0  CL 101 102  CO2 29 28  GLUCOSE 111* 155*  BUN 15 29*  CREATININE 1.16* 1.71*  CALCIUM 8.7* 8.4*   GFR: Estimated Creatinine Clearance: 36.4 mL/min (A) (by C-G formula based on SCr of 1.71 mg/dL (H)). Liver Function  Tests: Recent Labs  Lab 12/15/22 1141 12/16/22 0720  AST 24 17  ALT 30 28  ALKPHOS 56 60  BILITOT 0.9 0.4  PROT 7.6 7.3  ALBUMIN 3.2* 3.1*   No results for input(s): "LIPASE", "AMYLASE" in the last 168 hours. No results for input(s): "AMMONIA" in the last 168 hours. Coagulation Profile: No results for input(s): "INR", "PROTIME" in the last 168 hours. Cardiac Enzymes: No results for input(s): "CKTOTAL", "CKMB", "CKMBINDEX", "TROPONINI" in the last 168 hours. BNP (last 3 results) No results for input(s): "PROBNP" in the last 8760 hours. HbA1C: No results for input(s): "HGBA1C" in the last 72 hours. CBG: No results for input(s): "GLUCAP" in the last 168 hours. Lipid Profile: No results for input(s): "CHOL", "HDL", "LDLCALC", "TRIG", "CHOLHDL", "LDLDIRECT" in the last 72 hours. Thyroid Function Tests: No results for input(s): "TSH", "T4TOTAL", "FREET4", "T3FREE", "THYROIDAB" in the last 72 hours. Anemia Panel: No results for input(s): "VITAMINB12", "FOLATE", "FERRITIN", "TIBC", "IRON", "RETICCTPCT" in the last 72 hours. Urine analysis:    Component Value Date/Time   COLORURINE AMBER (A) 05/28/2020 0834   APPEARANCEUR TURBID (A) 05/28/2020 0834   LABSPEC 1.021 05/28/2020 0834   PHURINE 5.0 05/28/2020 0834   GLUCOSEU NEGATIVE 05/28/2020 0834   HGBUR LARGE (A) 05/28/2020 0834   BILIRUBINUR NEGATIVE 05/28/2020 0834   KETONESUR NEGATIVE 05/28/2020 0834   PROTEINUR 100 (A) 05/28/2020 0834   UROBILINOGEN 0.2 04/14/2015 1451   NITRITE NEGATIVE 05/28/2020 0834   LEUKOCYTESUR MODERATE (A) 05/28/2020 0834   Sepsis Labs: '@LABRCNTIP'$ (procalcitonin:4,lacticidven:4)  ) Recent Results (from the past 240 hour(s))  Resp panel by RT-PCR (RSV, Flu A&B, Covid) Anterior Nasal Swab     Status: None   Collection Time: 12/15/22 11:45 AM   Specimen: Anterior Nasal Swab  Result Value Ref Range Status   SARS Coronavirus 2 by RT PCR NEGATIVE NEGATIVE Final   Influenza A by PCR NEGATIVE NEGATIVE  Final   Influenza B by PCR NEGATIVE NEGATIVE Final    Comment: (NOTE) The Xpert Xpress SARS-CoV-2/FLU/RSV plus assay is intended as an aid in the diagnosis of influenza from Nasopharyngeal swab specimens and should not be used as a sole basis for treatment. Nasal washings and aspirates are unacceptable for Xpert Xpress SARS-CoV-2/FLU/RSV testing.  Fact Sheet for Patients: EntrepreneurPulse.com.au  Fact Sheet for Healthcare Providers: IncredibleEmployment.be  This test is not yet approved or cleared by the [place] FDA and has been authorized for detection and/or diagnosis of SARS-CoV-2 by FDA under an Emergency Use Authorization (EUA). This EUA will remain in effect (meaning this test can be used) for the duration of the COVID-19 declaration under Section 564(b)(1) of the Act, 21 U.S.C. section 360bbb-3(b)(1), unless the authorization is terminated or revoked.     Resp Syncytial Virus by PCR NEGATIVE NEGATIVE Final    Comment: (NOTE) Fact Sheet for Patients: EntrepreneurPulse.com.au  Fact Sheet for Healthcare Providers: IncredibleEmployment.be  This test is not yet approved or cleared  by the [place] and has been authorized for detection and/or diagnosis of SARS-CoV-2 by FDA under an Emergency Use Authorization (EUA). This EUA will remain in effect (meaning this test can be used) for the duration of the COVID-19 declaration under Section 564(b)(1) of the Act, 21 U.S.C. section 360bbb-3(b)(1), unless the authorization is terminated or revoked.  Performed at [facility], [place] [address], [place], [place] [address]   Respiratory (~20 pathogens) panel by PCR     Status: None   Collection Time: 12/15/22 11:45 AM   Specimen: Nasopharyngeal Swab; Respiratory  Result Value Ref Range Status   Adenovirus NOT DETECTED NOT DETECTED Final   Coronavirus 229E NOT DETECTED NOT DETECTED Final     Comment: (NOTE) The Coronavirus on the Respiratory Panel, DOES NOT test for the novel  Coronavirus (2019 nCoV)    Coronavirus HKU1 NOT DETECTED NOT DETECTED Final   Coronavirus NL63 NOT DETECTED NOT DETECTED Final   Coronavirus OC43 NOT DETECTED NOT DETECTED Final   Metapneumovirus NOT DETECTED NOT DETECTED Final   Rhinovirus / Enterovirus NOT DETECTED NOT DETECTED Final   Influenza A NOT DETECTED NOT DETECTED Final   Influenza B NOT DETECTED NOT DETECTED Final   Parainfluenza Virus 1 NOT DETECTED NOT DETECTED Final   Parainfluenza Virus 2 NOT DETECTED NOT DETECTED Final   Parainfluenza Virus 3 NOT DETECTED NOT DETECTED Final   Parainfluenza Virus 4 NOT DETECTED NOT DETECTED Final   Respiratory Syncytial Virus NOT DETECTED NOT DETECTED Final   Bordetella pertussis NOT DETECTED NOT DETECTED Final   Bordetella Parapertussis NOT DETECTED NOT DETECTED Final   Chlamydophila pneumoniae NOT DETECTED NOT DETECTED Final   Mycoplasma pneumoniae NOT DETECTED NOT DETECTED Final    Comment: Performed at [facility], [place]. [address], [place], [place] [address]     Radiology Studies: NM PULMONARY VENT AND PERF (V/Q Scan)  Result Date: 12/15/2022 CLINICAL DATA:  Shortness of breath and dizziness EXAM: NUCLEAR MEDICINE PERFUSION LUNG SCAN TECHNIQUE: Perfusion images were obtained in multiple projections after intravenous injection of radiopharmaceutical. Ventilation scans intentionally deferred if perfusion scan and chest x-ray adequate for interpretation during COVID 19 epidemic. RADIOPHARMACEUTICALS:  4.3 mCi Tc-64mMAA IV COMPARISON:  Chest x-ray earlier 12/15/2022 FINDINGS: No significant perfusion defects.  Enlarged heart. IMPRESSION: Negative perfusion lung scan Electronically Signed   By: AJill SideM.D.   On: 12/15/2022 17:07   DG Chest Port 1 View  Result Date: 12/15/2022 CLINICAL DATA:  SOB EXAM: PORTABLE CHEST 1 VIEW COMPARISON:  August 08, 2020 FINDINGS: The cardiomediastinal  silhouette is unchanged and enlarged in contour. No pleural effusion. No pneumothorax. Central vascular prominence with cephalization of the vasculature and mild peribronchial cuffing. IMPRESSION: Constellation of findings are favored to reflect mild pulmonary edema. Electronically Signed   By: SValentino SaxonM.D.   On: 12/15/2022 12:31     Scheduled Meds:  amiodarone  100 mg Oral Daily   amLODipine  5 mg Oral Daily   aspirin  81 mg Oral Daily   atorvastatin  80 mg Oral Daily   carvedilol  6.25 mg Oral BID WC   clopidogrel  75 mg Oral Daily   enoxaparin (LOVENOX) injection  0.5 mg/kg Subcutaneous Q24H   furosemide  40 mg Oral Q1400   ipratropium  0.5 mg Nebulization Q6H WA   isosorbide mononitrate  60 mg Oral Daily   mometasone-formoterol  2 puff Inhalation BID   pantoprazole  40 mg Oral QHS   predniSONE  40 mg Oral Q breakfast   sodium chloride flush  3 mL Intravenous Q12H   Continuous Infusions:   LOS: 0 days    Time spent: 2mn    PDomenic Polite MD [facility]   12/16/2022, 11:00 AM

## 2022-12-16 NOTE — Evaluation (Signed)
Occupational Therapy Evaluation Patient Details Name: Sherry Fitzpatrick MRN: [id number] DOB: [birth year] Today's Date: 12/16/2022   History of Present Illness 72 y.o. female with medical history significant of cognitive impairment, diabetes, diastolic CHF, stroke, A-fib, OSA, GERD, anemia, obesity, hyperlipidemia, CKD 3, asthma, anxiety, depression presenting 12/15/22 with shortness of breath. +pulmonary edema; VQ scan negative; resp panel negative; COPD exacerbation   Clinical Impression   PTA, pt lived with daughter's family who assisted with IADL. Upon eval, pt performing ADL with up to min guard A. Pt performing simulated tub-shower transfer during session with min guard A and use of wall for balance support. Cognition seems intact for basic ADL tasks. Pt endorses activity tolerance and decreased balance. Will follow acutely, but suspect pt will not need follow up OT at time of discharge.     Recommendations for follow up therapy are one component of a multi-disciplinary discharge planning process, led by the attending physician.  Recommendations may be updated based on patient status, additional functional criteria and insurance authorization.   Follow Up Recommendations  No OT follow up     Assistance Recommended at Discharge Intermittent Supervision/Assistance  Patient can return home with the following A little help with walking and/or transfers;A little help with bathing/dressing/bathroom;Assistance with cooking/housework;Direct supervision/assist for medications management;Direct supervision/assist for financial management;Assist for transportation;Help with stairs or ramp for entrance    Functional Status Assessment  Patient has had a recent decline in their functional status and demonstrates the ability to make significant improvements in function in a reasonable and predictable amount of time.  Equipment Recommendations  None recommended by OT    Recommendations for Other Services        Precautions / Restrictions Precautions Precautions: Other (comment) Precaution Comments: monitor sats      Mobility Bed Mobility Overal bed mobility: Needs Assistance Bed Mobility: Sidelying to Sit   Sidelying to sit: Min assist       General bed mobility comments: able to manage lower body; assist to raise torso from propped on rt elbow    Transfers Overall transfer level: Needs assistance Equipment used: None Transfers: Sit to/from Stand Sit to Stand: Min guard           General transfer comment: no imbalance or dizziness      Balance Overall balance assessment: Mild deficits observed, not formally tested                                         ADL either performed or assessed with clinical judgement   ADL Overall ADL's : Needs assistance/impaired Eating/Feeding: Independent;Sitting   Grooming: Min guard;Standing   Upper Body Bathing: Set up;Sitting   Lower Body Bathing: Min guard;Sit to/from stand   Upper Body Dressing : Set up;Sitting   Lower Body Dressing: Min guard;Sit to/from stand   Toilet Transfer: Agricultural engineer (2 wheels) Armed forces technical officer Details (indicate cue type and reason): for safety     Tub/ Shower Transfer: Min guard;Rolling walker (2 wheels);Ambulation Tub/Shower Transfer Details (indicate cue type and reason): for safety Functional mobility during ADLs: Min guard;Rolling walker (2 wheels)       Vision Baseline Vision/History: 0 No visual deficits Ability to See in Adequate Light: 0 Adequate Patient Visual Report: No change from baseline Vision Assessment?: No apparent visual deficits     Perception     Praxis      Pertinent Vitals/Pain  Pain Assessment Pain Assessment: No/denies pain     Hand Dominance     Extremity/Trunk Assessment Upper Extremity Assessment Upper Extremity Assessment: Overall WFL for tasks assessed   Lower Extremity Assessment Lower Extremity Assessment: Defer to  PT evaluation   Cervical / Trunk Assessment Cervical / Trunk Assessment: Normal   Communication Communication Communication: No difficulties   Cognition Arousal/Alertness: Awake/alert Behavior During Therapy: WFL for tasks assessed/performed Overall Cognitive Status: Within Functional Limits for tasks assessed                                 General Comments: per chart, cognitive deficits at baseline. Per pt report, she receives assist with medication management and financial management. May occasionally make simple meal independently. Pt with intermittent slowed processing, but [facility] for basic daily tasks. Oriented to day and date.     General Comments  2L O2    Exercises     Shoulder Instructions      Home Living Family/patient expects to be discharged to:: Private residence Living Arrangements: Children (daughter) Available Help at Discharge: Family;Available PRN/intermittently Type of Home: House Home Access: Level entry (if she goes in the door straight to her room)     Home Layout: Two level Alternate Level Stairs-Number of Steps: 2 Alternate Level Stairs-Rails: None Bathroom Shower/Tub: Tub/shower unit         Home Equipment: Conservation officer, nature (2 wheels)          Prior Functioning/Environment Prior Level of Function : Independent/Modified Independent             Mobility Comments: rarely uses RW ADLs Comments: Pt reports independent in ADL and that daughter and son in law assist with IADL.        OT Problem List: Decreased strength;Decreased activity tolerance;Impaired balance (sitting and/or standing);Impaired vision/perception;Cardiopulmonary status limiting activity;Decreased cognition;Decreased safety awareness      OT Treatment/Interventions: Therapeutic exercise;Self-care/ADL training;Energy conservation;DME and/or AE instruction;Patient/family education;Balance training;Cognitive remediation/compensation;Therapeutic activities     OT Goals(Current goals can be found in the care plan section) Acute Rehab OT Goals Patient Stated Goal: go home OT Goal Formulation: With patient Time For Goal Achievement: 12/30/22 Potential to Achieve Goals: Good  OT Frequency: Min 2X/week    Co-evaluation              AM-PAC OT "6 Clicks" Daily Activity     Outcome Measure Help from another person eating meals?: None Help from another person taking care of personal grooming?: A Little Help from another person toileting, which includes using toliet, bedpan, or urinal?: A Little Help from another person bathing (including washing, rinsing, drying)?: A Little Help from another person to put on and taking off regular upper body clothing?: A Little Help from another person to put on and taking off regular lower body clothing?: A Little 6 Click Score: 19   End of Session Equipment Utilized During Treatment: Gait belt Nurse Communication: Mobility status  Activity Tolerance: Patient tolerated treatment well Patient left: in chair;with call bell/phone within reach;with chair alarm set  OT Visit Diagnosis: Unsteadiness on feet (R26.81);Muscle weakness (generalized) (M62.81);Other symptoms and signs involving cognitive function                Time: 1531-1553 OT Time Calculation (min): 22 min Charges:  OT General Charges $OT Visit: 1 Visit OT Evaluation $OT Eval Low Complexity: 1 Low  Magnus Ivan, OTD, OTR/L [facility] Acute Rehabilitation Office: (  [phone]   Magnus Ivan 12/16/2022, 4:50 PM

## 2022-12-16 NOTE — Evaluation (Signed)
Physical Therapy Evaluation Patient Details Name: Sherry Fitzpatrick MRN: [id number] DOB: 09/07/1951 Today's Date: 12/16/2022  History of Present Illness  72 y.o. female with medical history significant of cognitive impairment, diabetes, diastolic CHF, stroke, A-fib, OSA, GERD, anemia, obesity, hyperlipidemia, CKD 3, asthma, anxiety, depression presenting 12/15/22 with shortness of breath. +pulmonary edema; VQ scan negative; resp panel negative; COPD exacerbation  Clinical Impression   Pt admitted secondary to problem above with deficits below. PTA patient was living with daughter in two level home with 3 steps to enter and 2 steps down to level with her bedroom. Patient does report there is a door straight into her bedroom where she would not have to go up the 3 outside steps (level entry). Reports she rarely uses RW and does well with no reported falls.  Pt currently requires minguard assist for mobility to monitor cardiopulmonary response to incr activity. On room air, sats >=89% during ambulation and 97% at rest. Likely one more session with PT and can discharge from PT.  Anticipate patient will benefit from PT to address problems listed below.Will continue to follow acutely to maximize functional mobility independence and safety.          Recommendations for follow up therapy are one component of a multi-disciplinary discharge planning process, led by the attending physician.  Recommendations may be updated based on patient status, additional functional criteria and insurance authorization.  Follow Up Recommendations No PT follow up      Assistance Recommended at Discharge Set up Supervision/Assistance  Patient can return home with the following  Assistance with cooking/housework;Direct supervision/assist for medications management;Direct supervision/assist for financial management;Assist for transportation;Help with stairs or ramp for entrance    Equipment Recommendations None recommended by  PT  Recommendations for Other Services       Functional Status Assessment Patient has had a recent decline in their functional status and demonstrates the ability to make significant improvements in function in a reasonable and predictable amount of time.     Precautions / Restrictions Precautions Precautions: Other (comment) Precaution Comments: monitor sats      Mobility  Bed Mobility Overal bed mobility: Needs Assistance Bed Mobility: Sidelying to Sit   Sidelying to sit: Min assist       General bed mobility comments: able to manage lower body; assist to raise torso from propped on rt elbow    Transfers Overall transfer level: Needs assistance Equipment used: None Transfers: Sit to/from Stand Sit to Stand: Min guard           General transfer comment: no imbalance or dizziness    Ambulation/Gait Ambulation/Gait assistance: Min guard Gait Distance (Feet): 180 Feet Assistive device: None Gait Pattern/deviations: Step-through pattern, Decreased stride length, Wide base of support   Gait velocity interpretation: 1.31 - 2.62 ft/sec, indicative of limited community ambulator   General Gait Details: slower velocity; able to perform head turns without drifting  Stairs            Wheelchair Mobility    Modified Rankin (Stroke Patients Only)       Balance Overall balance assessment: Mild deficits observed, not formally tested                                           Pertinent Vitals/Pain Pain Assessment Pain Assessment: No/denies pain    Home Living Family/patient expects to be discharged to:: Private  residence Living Arrangements: Children (daughter) Available Help at Discharge: Family;Available PRN/intermittently Type of Home: House Home Access: Level entry (if she goes in the door straight to her room)     Alternate Level Stairs-Number of Steps: 2 Home Layout: Two level Home Equipment: Conservation officer, nature (2 wheels)       Prior Function Prior Level of Function : Independent/Modified Independent             Mobility Comments: rarely uses RW       Hand Dominance        Extremity/Trunk Assessment   Upper Extremity Assessment Upper Extremity Assessment: Defer to OT evaluation    Lower Extremity Assessment Lower Extremity Assessment: Overall WFL for tasks assessed    Cervical / Trunk Assessment Cervical / Trunk Assessment: Normal  Communication   Communication: No difficulties  Cognition Arousal/Alertness: Awake/alert Behavior During Therapy: WFL for tasks assessed/performed Overall Cognitive Status: Within Functional Limits for tasks assessed                                 General Comments: Initially sleeping, but wide awake once sitting EOB        General Comments General comments (skin integrity, edema, etc.): on 2L on arrival with sats 100%; rest RA 97%; ambulating poor pleth but >89% when good pleth; at rest on RA 97%    Exercises     Assessment/Plan    PT Assessment Patient needs continued PT services  PT Problem List Decreased balance;Decreased mobility;Decreased knowledge of use of DME;Cardiopulmonary status limiting activity       PT Treatment Interventions DME instruction;Gait training;Stair training;Functional mobility training;Therapeutic activities;Therapeutic exercise;Balance training;Patient/family education    PT Goals (Current goals can be found in the Care Plan section)  Acute Rehab PT Goals Patient Stated Goal: return home without oxygen PT Goal Formulation: With patient Time For Goal Achievement: 12/30/22 Potential to Achieve Goals: Good    Frequency Min 3X/week     Co-evaluation               AM-PAC PT "6 Clicks" Mobility  Outcome Measure Help needed turning from your back to your side while in a flat bed without using bedrails?: None Help needed moving from lying on your back to sitting on the side of a flat bed without using  bedrails?: A Little Help needed moving to and from a bed to a chair (including a wheelchair)?: None Help needed standing up from a chair using your arms (e.g., wheelchair or bedside chair)?: None Help needed to walk in hospital room?: A Little Help needed climbing 3-5 steps with a railing? : A Little 6 Click Score: 21    End of Session Equipment Utilized During Treatment: Gait belt Activity Tolerance: Patient tolerated treatment well Patient left: in chair;with call bell/phone within reach;with chair alarm set;with family/visitor present Nurse Communication: Mobility status;Other (comment) (on room air sats 97%; RN ok with leaving oxygen off) PT Visit Diagnosis: Difficulty in walking, not elsewhere classified (R26.2)    Time: [id number] PT Time Calculation (min) (ACUTE ONLY): 28 min   Charges:   PT Evaluation $PT Eval Low Complexity: 1 Low PT Treatments $Gait Training: 8-22 mins         [place]  Office [phone]   Rexanne Mano 12/16/2022, 2:49 PM

## 2022-12-16 NOTE — Care Management Obs Status (Signed)
[place] NOTIFICATION   Patient Details  Name: Sherry Fitzpatrick MRN: [id number] Date of Birth: November 12, 1950   Medicare Observation Status Notification Given:  Yes    Zenon Mayo, RN 12/16/2022, 9:27 AM

## 2022-12-16 NOTE — TOC Progression Note (Addendum)
Transition of Care [facility]) - Progression Note    Patient Details  Name: Sherry Fitzpatrick MRN: [id number] Date of Birth: 25-May-1951  Transition of Care [facility]) CM/SW Contact  Zenon Mayo, RN Phone Number: 12/16/2022, 9:27 AM  Clinical Narrative:    From home , lives with her daughter, she has a walker since she had a hip replacement 5 years ago.  She still drived still, she does not use oxygen at home. Presents with CKD, COPD , Asthma ex, CHF ex, acute resp failure.   TOC following.         Expected Discharge Plan and Services                                               Social Determinants of Health (SDOH) Interventions SDOH Screenings   Tobacco Use: Low Risk  (12/15/2022)    Readmission Risk Interventions     No data to display

## 2022-12-16 NOTE — Plan of Care (Signed)
  Problem: Clinical Measurements: Goal: Will remain free from infection Outcome: Completed/Met   Problem: Nutrition: Goal: Adequate nutrition will be maintained Outcome: Completed/Met   Problem: Elimination: Goal: Will not experience complications related to bowel motility Outcome: Completed/Met   Problem: Pain Managment: Goal: General experience of comfort will improve Outcome: Completed/Met   Problem: Skin Integrity: Goal: Risk for impaired skin integrity will decrease Outcome: Completed/Met

## 2022-12-17 DIAGNOSIS — D631 Anemia in chronic kidney disease: Secondary | ICD-10-CM | POA: Diagnosis not present

## 2022-12-17 DIAGNOSIS — Z1152 Encounter for screening for COVID-19: Secondary | ICD-10-CM | POA: Diagnosis not present

## 2022-12-17 DIAGNOSIS — G4733 Obstructive sleep apnea (adult) (pediatric): Secondary | ICD-10-CM | POA: Diagnosis not present

## 2022-12-17 DIAGNOSIS — J811 Chronic pulmonary edema: Secondary | ICD-10-CM | POA: Diagnosis not present

## 2022-12-17 DIAGNOSIS — E873 Alkalosis: Secondary | ICD-10-CM | POA: Diagnosis not present

## 2022-12-17 DIAGNOSIS — J9601 Acute respiratory failure with hypoxia: Secondary | ICD-10-CM | POA: Diagnosis not present

## 2022-12-17 DIAGNOSIS — R0602 Shortness of breath: Secondary | ICD-10-CM | POA: Diagnosis not present

## 2022-12-17 DIAGNOSIS — N1832 Chronic kidney disease, stage 3b: Secondary | ICD-10-CM | POA: Diagnosis not present

## 2022-12-17 DIAGNOSIS — I10 Essential (primary) hypertension: Secondary | ICD-10-CM | POA: Diagnosis not present

## 2022-12-17 DIAGNOSIS — J441 Chronic obstructive pulmonary disease with (acute) exacerbation: Secondary | ICD-10-CM | POA: Diagnosis present

## 2022-12-17 DIAGNOSIS — N179 Acute kidney failure, unspecified: Secondary | ICD-10-CM | POA: Diagnosis not present

## 2022-12-17 DIAGNOSIS — I130 Hypertensive heart and chronic kidney disease with heart failure and stage 1 through stage 4 chronic kidney disease, or unspecified chronic kidney disease: Secondary | ICD-10-CM | POA: Diagnosis not present

## 2022-12-17 DIAGNOSIS — I480 Paroxysmal atrial fibrillation: Secondary | ICD-10-CM | POA: Diagnosis not present

## 2022-12-17 DIAGNOSIS — G3184 Mild cognitive impairment, so stated: Secondary | ICD-10-CM | POA: Diagnosis not present

## 2022-12-17 DIAGNOSIS — Z6841 Body Mass Index (BMI) 40.0 and over, adult: Secondary | ICD-10-CM | POA: Diagnosis not present

## 2022-12-17 DIAGNOSIS — Z79899 Other long term (current) drug therapy: Secondary | ICD-10-CM | POA: Diagnosis not present

## 2022-12-17 DIAGNOSIS — J45901 Unspecified asthma with (acute) exacerbation: Secondary | ICD-10-CM | POA: Diagnosis present

## 2022-12-17 DIAGNOSIS — E785 Hyperlipidemia, unspecified: Secondary | ICD-10-CM | POA: Diagnosis present

## 2022-12-17 DIAGNOSIS — J939 Pneumothorax, unspecified: Secondary | ICD-10-CM | POA: Diagnosis not present

## 2022-12-17 DIAGNOSIS — I2510 Atherosclerotic heart disease of native coronary artery without angina pectoris: Secondary | ICD-10-CM | POA: Diagnosis present

## 2022-12-17 DIAGNOSIS — J439 Emphysema, unspecified: Secondary | ICD-10-CM | POA: Diagnosis not present

## 2022-12-17 DIAGNOSIS — Y92239 Unspecified place in hospital as the place of occurrence of the external cause: Secondary | ICD-10-CM | POA: Diagnosis not present

## 2022-12-17 DIAGNOSIS — N183 Chronic kidney disease, stage 3 unspecified: Secondary | ICD-10-CM | POA: Diagnosis present

## 2022-12-17 DIAGNOSIS — F419 Anxiety disorder, unspecified: Secondary | ICD-10-CM | POA: Diagnosis not present

## 2022-12-17 DIAGNOSIS — E1165 Type 2 diabetes mellitus with hyperglycemia: Secondary | ICD-10-CM | POA: Diagnosis not present

## 2022-12-17 DIAGNOSIS — E1122 Type 2 diabetes mellitus with diabetic chronic kidney disease: Secondary | ICD-10-CM | POA: Diagnosis present

## 2022-12-17 DIAGNOSIS — Z66 Do not resuscitate: Secondary | ICD-10-CM | POA: Diagnosis not present

## 2022-12-17 DIAGNOSIS — I5022 Chronic systolic (congestive) heart failure: Secondary | ICD-10-CM | POA: Diagnosis not present

## 2022-12-17 DIAGNOSIS — I080 Rheumatic disorders of both mitral and aortic valves: Secondary | ICD-10-CM | POA: Diagnosis present

## 2022-12-17 DIAGNOSIS — Z794 Long term (current) use of insulin: Secondary | ICD-10-CM | POA: Diagnosis not present

## 2022-12-17 DIAGNOSIS — F32A Depression, unspecified: Secondary | ICD-10-CM | POA: Diagnosis not present

## 2022-12-17 DIAGNOSIS — I5043 Acute on chronic combined systolic (congestive) and diastolic (congestive) heart failure: Secondary | ICD-10-CM | POA: Diagnosis not present

## 2022-12-17 DIAGNOSIS — I425 Other restrictive cardiomyopathy: Secondary | ICD-10-CM | POA: Diagnosis not present

## 2022-12-17 DIAGNOSIS — Z955 Presence of coronary angioplasty implant and graft: Secondary | ICD-10-CM | POA: Diagnosis not present

## 2022-12-17 LAB — BASIC METABOLIC PANEL
Anion gap: 10 (ref 5–15)
BUN: 40 mg/dL — ABNORMAL HIGH (ref 8–23)
CO2: 30 mmol/L (ref 22–32)
Calcium: 8.4 mg/dL — ABNORMAL LOW (ref 8.9–10.3)
Chloride: 98 mmol/L (ref 98–111)
Creatinine, Ser: 2.16 mg/dL — ABNORMAL HIGH (ref 0.44–1.00)
GFR, Estimated: 24 mL/min — ABNORMAL LOW (ref >60)
Glucose, Bld: 202 mg/dL — ABNORMAL HIGH (ref 70–99)
Potassium: 4.2 mmol/L (ref 3.5–5.1)
Sodium: 138 mmol/L (ref 135–145)

## 2022-12-17 MED ORDER — SODIUM CHLORIDE 0.9 % IV SOLN: INTRAVENOUS | Status: AC

## 2022-12-17 MED ORDER — PREDNISONE 20 MG PO TABS
30.0000 mg | ORAL_TABLET | Freq: Every day | ORAL | Status: DC
  Administered 2022-12-18 – 2022-12-19 (×2): 30 mg via ORAL
  Filled 2022-12-17 (×2): qty 1

## 2022-12-17 MED ORDER — ENOXAPARIN SODIUM 30 MG/0.3ML IJ SOSY
30.0000 mg | PREFILLED_SYRINGE | INTRAMUSCULAR | Status: DC
  Administered 2022-12-17 – 2022-12-18 (×2): 30 mg via SUBCUTANEOUS
  Filled 2022-12-17 (×2): qty 0.3

## 2022-12-17 MED ORDER — IPRATROPIUM-ALBUTEROL 0.5-2.5 (3) MG/3ML IN SOLN
3.0000 mL | Freq: Two times a day (BID) | RESPIRATORY_TRACT | Status: DC
  Administered 2022-12-17 – 2022-12-26 (×18): 3 mL via RESPIRATORY_TRACT
  Filled 2022-12-17 (×17): qty 3

## 2022-12-17 NOTE — Progress Notes (Signed)
Occupational Therapy Treatment Patient Details Name: Sherry Fitzpatrick MRN: [id number] DOB: 02-22-1951 Today's Date: 12/17/2022   History of present illness 72 y.o. female with medical history significant of cognitive impairment, diabetes, diastolic CHF, stroke, A-fib, OSA, GERD, anemia, obesity, hyperlipidemia, CKD 3, asthma, anxiety, depression presenting 12/15/22 with shortness of breath. +pulmonary edema; VQ scan negative; resp panel negative; COPD exacerbation   OT comments  Pt making good progress toward adls. Pt requires some encouragement to get out of bed but once she does, pt did very well.  O2 sats remained above 90% during session on RA.  Spoke with nursing to see about leaving O2 off for now and nursing stated she will monitor.  Pt with purewick and depends and soaked on arrival. Encouraged pt to use the [facility] or walk to bathroom with min guard assist instead of relying on purewick since pt mobilizes well and does not have them at home.    Recommendations for follow up therapy are one component of a multi-disciplinary discharge planning process, led by the attending physician.  Recommendations may be updated based on patient status, additional functional criteria and insurance authorization.    Follow Up Recommendations  No OT follow up     Assistance Recommended at Discharge Intermittent Supervision/Assistance  Patient can return home with the following  A little help with walking and/or transfers;A little help with bathing/dressing/bathroom;Assistance with cooking/housework;Direct supervision/assist for medications management;Direct supervision/assist for financial management;Assist for transportation;Help with stairs or ramp for entrance   Equipment Recommendations  None recommended by OT    Recommendations for Other Services      Precautions / Restrictions Precautions Precautions: Other (comment) Precaution Comments: monitor O2 sats Restrictions Weight Bearing Restrictions: No        Mobility Bed Mobility Overal bed mobility: Needs Assistance Bed Mobility: Supine to Sit     Supine to sit: Supervision     General bed mobility comments: Pt used rail only    Transfers Overall transfer level: Needs assistance Equipment used: None Transfers: Sit to/from Stand Sit to Stand: Min guard           General transfer comment: no imbalance or dizziness     Balance Overall balance assessment: Mild deficits observed, not formally tested                                         ADL either performed or assessed with clinical judgement   ADL Overall ADL's : Needs assistance/impaired Eating/Feeding: Independent;Sitting   Grooming: Min guard;Standing       Lower Body Bathing: Min guard;Sit to/from stand       Lower Body Dressing: Min guard;Sit to/from stand Lower Body Dressing Details (indicate cue type and reason): pt donned socks at EOB, pullup in chair.     Toileting- Clothing Manipulation and Hygiene: Minimal assistance;Sit to/from stand;Cueing for compensatory techniques Toileting - Clothing Manipulation Details (indicate cue type and reason): Pt using purewick. Pt uses depends at home due to leaking. Pt was soaked on arrival with use of purewick. Pt cleaned self and changed pull up.     Functional mobility during ADLs: Min guard;Rolling walker (2 wheels) General ADL Comments: Pt most limited by SOB and fatigue. Pt was on 2L of O2 on arrival with sats at 100%. O2 removed and pt stayed between 91%-96%. Nursing aware and said to leave O2 out and she would keep and eye  on things.    Extremity/Trunk Assessment Upper Extremity Assessment Upper Extremity Assessment: Overall WFL for tasks assessed   Lower Extremity Assessment Lower Extremity Assessment: Defer to PT evaluation        Vision   Vision Assessment?: No apparent visual deficits   Perception Perception Perception: Within Functional Limits   Praxis Praxis Praxis:  Intact    Cognition Arousal/Alertness: Awake/alert Behavior During Therapy: WFL for tasks assessed/performed Overall Cognitive Status: Within Functional Limits for tasks assessed                                 General Comments: per chart, cognitive deficits at baseline. Per pt report, she receives assist with medication management and financial management. May occasionally make simple meal independently. Pt with intermittent slowed processing, but [facility] for basic daily tasks. Oriented to day and date.        Exercises      Shoulder Instructions       General Comments Pt most limited during adls with fatigue.    Pertinent Vitals/ Pain       Pain Assessment Pain Assessment: No/denies pain  Home Living                                          Prior Functioning/Environment              Frequency  Min 2X/week        Progress Toward Goals  OT Goals(current goals can now be found in the care plan section)  Progress towards OT goals: Progressing toward goals  Acute Rehab OT Goals Patient Stated Goal: to get stronger OT Goal Formulation: With patient Time For Goal Achievement: 12/30/22 Potential to Achieve Goals: Good ADL Goals Pt Will Perform Lower Body Dressing: with modified independence;sit to/from stand Pt Will Transfer to Toilet: with modified independence;ambulating;regular height toilet Additional ADL Goal #1: Pt will identify 3+ energy conservation techniques to utilize in the home setting.  Plan Discharge plan remains appropriate    Co-evaluation                 AM-PAC OT "6 Clicks" Daily Activity     Outcome Measure   Help from another person eating meals?: None Help from another person taking care of personal grooming?: None Help from another person toileting, which includes using toliet, bedpan, or urinal?: A Little Help from another person bathing (including washing, rinsing, drying)?: None Help from another  person to put on and taking off regular upper body clothing?: None Help from another person to put on and taking off regular lower body clothing?: A Little 6 Click Score: 22    End of Session    OT Visit Diagnosis: Unsteadiness on feet (R26.81);Muscle weakness (generalized) (M62.81);Other symptoms and signs involving cognitive function   Activity Tolerance Patient tolerated treatment well   Patient Left in chair;with call bell/phone within reach;with chair alarm set   Nurse Communication Mobility status;Other (comment) (left off O2 since pt stating in 90s)        Time: 0820-0858 OT Time Calculation (min): 38 min  Charges: OT General Charges $OT Visit: 1 Visit OT Treatments $Self Care/Home Management : 38-52 mins   Glenford Peers 12/17/2022, 9:06 AM

## 2022-12-17 NOTE — Progress Notes (Signed)
PROGRESS NOTE    ICOLE PLACHTA  [id number] DOB: May 01, 1951 DOA: 12/15/2022 PCP: Glenis Smoker, MD  72/F w/cognitive impairment, diabetes, diastolic CHF, stroke, A-fib, OSA, GERD, anemia, obesity, hyperlipidemia, CKD 3, asthma, anxiety, depression presenting with shortness of breath x few days. EMS was called noted to be saturating in the 80s on room air.  Some improvement following Solu-Medrol, DuoNeb, albuterol and route. In ED,creatinine stable 1.16, Troponin negative x 2. D-dimer elevated 1.31.  Respiratory panel for flu COVID RSV negative.  Chest x-ray showing findings favoring pulmonary edema.  VQ scan negative.  Patient received Lasix and DuoNeb in the ED  Subjective: Feels better, breathing is improving  Assessment and Plan:  Acute respiratory failure with hypoxia COPD/asthma exacerbation -Now suspect this is primarily COPD exacerbation, improved with steroids nebs etc. -Flu, COVID PCR, respiratory virus panel is negative, continue Symbicort and PRN albuterol   Chronic diastolic CHF > Last echo in the system was in 2021 with EF 60-65%, indeterminate diastolic function. -Euvolemic, worsened creatinine with overdiuresis, ARB -ARB on hold, hold diuretics  AKI -Likely secondary to overdiuresis, ARB use -Holding valsartan, hold Lasix today, BMP in a.m.   Paroxysmal A-fib - Continue home amiodarone, carvedilol -In sinus rhythm now, not on anticoagulation at baseline, likely secondary to DAPT therapy   CAD > Status post stenting in 2021. - Continue aspirin, Plavix - Continue atorvastatin, carvedilol, Imdur   Mild  cognitive impairment -Apparently started on Namenda recently, patient declines using this   GERD - Continue home PPI   Anemia > Hemoglobin stable at 10.7 - Trend CBC   Hyperlipidemia - Continue home atorvastatin   Anxiety Depression - Listed in chart but not currently on medications for this   Obesity - Noted   DVT prophylaxis:       Lovenox Code Status:              DNR, Okay with trial of intubation Family Communication:     None present Disposition Plan: Home likely 1 to 2 days  Consultants:    Procedures:   Antimicrobials:    Objective: Vitals:   12/17/22 0736 12/17/22 0935 12/17/22 0938 12/17/22 1033  BP: (!) 156/82   132/67  Pulse: 65   (!) 59  Resp: 19   20  Temp: 97.9 F (36.6 C)   97.6 F (36.4 C)  TempSrc: Oral   Oral  SpO2: 97% (!) 81% 95% 97%  Weight:      Height:        Intake/Output Summary (Last 24 hours) at 12/17/2022 1116 Last data filed at 12/17/2022 0841 Gross per 24 hour  Intake 577 ml  Output 625 ml  Net -48 ml   Filed Weights   12/15/22 2123 12/16/22 0002 12/17/22 0524  Weight: 114.9 kg 114.9 kg 117.7 kg    Examination:  General exam: Appears calm and comfortable, AAOx3, mild cognitive deficits Respiratory system: Clear to auscultation Cardiovascular system: S1 & S2 heard, RRR.  Abd: nondistended, soft and nontender.Normal bowel sounds heard. Central nervous system: Alert and oriented. No focal neurological deficits. Extremities: no edema Skin: No rashes Psychiatry:  Mood & affect appropriate.     Data Reviewed:   CBC: Recent Labs  Lab 12/15/22 1141 12/16/22 0720  WBC 7.0 12.0*  NEUTROABS 4.2  --   HGB 10.7* 10.7*  HCT 34.5* 33.1*  MCV 94.5 92.7  PLT 297 [id number]   Basic Metabolic Panel: Recent Labs  Lab 12/15/22 1141 12/16/22 0720 12/17/22 0040  NA  139 139 138  K 4.0 4.0 4.2  CL 101 102 98  CO2 '29 28 30  '$ GLUCOSE 111* 155* 202*  BUN 15 29* 40*  CREATININE 1.16* 1.71* 2.16*  CALCIUM 8.7* 8.4* 8.4*   GFR: Estimated Creatinine Clearance: 29.3 mL/min (A) (by C-G formula based on SCr of 2.16 mg/dL (H)). Liver Function Tests: Recent Labs  Lab 12/15/22 1141 12/16/22 0720  AST 24 17  ALT 30 28  ALKPHOS 56 60  BILITOT 0.9 0.4  PROT 7.6 7.3  ALBUMIN 3.2* 3.1*   No results for input(s): "LIPASE", "AMYLASE" in the last 168 hours. No results for  input(s): "AMMONIA" in the last 168 hours. Coagulation Profile: No results for input(s): "INR", "PROTIME" in the last 168 hours. Cardiac Enzymes: No results for input(s): "CKTOTAL", "CKMB", "CKMBINDEX", "TROPONINI" in the last 168 hours. BNP (last 3 results) No results for input(s): "PROBNP" in the last 8760 hours. HbA1C: No results for input(s): "HGBA1C" in the last 72 hours. CBG: No results for input(s): "GLUCAP" in the last 168 hours. Lipid Profile: No results for input(s): "CHOL", "HDL", "LDLCALC", "TRIG", "CHOLHDL", "LDLDIRECT" in the last 72 hours. Thyroid Function Tests: No results for input(s): "TSH", "T4TOTAL", "FREET4", "T3FREE", "THYROIDAB" in the last 72 hours. Anemia Panel: No results for input(s): "VITAMINB12", "FOLATE", "FERRITIN", "TIBC", "IRON", "RETICCTPCT" in the last 72 hours. Urine analysis:    Component Value Date/Time   COLORURINE AMBER (A) 05/28/2020 0834   APPEARANCEUR TURBID (A) 05/28/2020 0834   LABSPEC 1.021 05/28/2020 0834   PHURINE 5.0 05/28/2020 0834   GLUCOSEU NEGATIVE 05/28/2020 0834   HGBUR LARGE (A) 05/28/2020 0834   BILIRUBINUR NEGATIVE 05/28/2020 0834   KETONESUR NEGATIVE 05/28/2020 0834   PROTEINUR 100 (A) 05/28/2020 0834   UROBILINOGEN 0.2 04/14/2015 1451   NITRITE NEGATIVE 05/28/2020 0834   LEUKOCYTESUR MODERATE (A) 05/28/2020 0834   Sepsis Labs: '@LABRCNTIP'$ (procalcitonin:4,lacticidven:4)  ) Recent Results (from the past 240 hour(s))  Resp panel by RT-PCR (RSV, Flu A&B, Covid) Anterior Nasal Swab     Status: None   Collection Time: 12/15/22 11:45 AM   Specimen: Anterior Nasal Swab  Result Value Ref Range Status   SARS Coronavirus 2 by RT PCR NEGATIVE NEGATIVE Final   Influenza A by PCR NEGATIVE NEGATIVE Final   Influenza B by PCR NEGATIVE NEGATIVE Final    Comment: (NOTE) The Xpert Xpress SARS-CoV-2/FLU/RSV plus assay is intended as an aid in the diagnosis of influenza from Nasopharyngeal swab specimens and should not be used as a  sole basis for treatment. Nasal washings and aspirates are unacceptable for Xpert Xpress SARS-CoV-2/FLU/RSV testing.  Fact Sheet for Patients: EntrepreneurPulse.com.au  Fact Sheet for Healthcare Providers: IncredibleEmployment.be  This test is not yet approved or cleared by the [place] FDA and has been authorized for detection and/or diagnosis of SARS-CoV-2 by FDA under an Emergency Use Authorization (EUA). This EUA will remain in effect (meaning this test can be used) for the duration of the COVID-19 declaration under Section 564(b)(1) of the Act, 21 U.S.C. section 360bbb-3(b)(1), unless the authorization is terminated or revoked.     Resp Syncytial Virus by PCR NEGATIVE NEGATIVE Final    Comment: (NOTE) Fact Sheet for Patients: EntrepreneurPulse.com.au  Fact Sheet for Healthcare Providers: IncredibleEmployment.be  This test is not yet approved or cleared by the [place] FDA and has been authorized for detection and/or diagnosis of SARS-CoV-2 by FDA under an Emergency Use Authorization (EUA). This EUA will remain in effect (meaning this test can be used) for the  duration of the COVID-19 declaration under Section 564(b)(1) of the Act, 21 U.S.C. section 360bbb-3(b)(1), unless the authorization is terminated or revoked.  Performed at [facility], [place] [address], [place], [place] [address]   Respiratory (~20 pathogens) panel by PCR     Status: None   Collection Time: 12/15/22 11:45 AM   Specimen: Nasopharyngeal Swab; Respiratory  Result Value Ref Range Status   Adenovirus NOT DETECTED NOT DETECTED Final   Coronavirus 229E NOT DETECTED NOT DETECTED Final    Comment: (NOTE) The Coronavirus on the Respiratory Panel, DOES NOT test for the novel  Coronavirus (2019 nCoV)    Coronavirus HKU1 NOT DETECTED NOT DETECTED Final   Coronavirus NL63 NOT DETECTED NOT DETECTED Final   Coronavirus  OC43 NOT DETECTED NOT DETECTED Final   Metapneumovirus NOT DETECTED NOT DETECTED Final   Rhinovirus / Enterovirus NOT DETECTED NOT DETECTED Final   Influenza A NOT DETECTED NOT DETECTED Final   Influenza B NOT DETECTED NOT DETECTED Final   Parainfluenza Virus 1 NOT DETECTED NOT DETECTED Final   Parainfluenza Virus 2 NOT DETECTED NOT DETECTED Final   Parainfluenza Virus 3 NOT DETECTED NOT DETECTED Final   Parainfluenza Virus 4 NOT DETECTED NOT DETECTED Final   Respiratory Syncytial Virus NOT DETECTED NOT DETECTED Final   Bordetella pertussis NOT DETECTED NOT DETECTED Final   Bordetella Parapertussis NOT DETECTED NOT DETECTED Final   Chlamydophila pneumoniae NOT DETECTED NOT DETECTED Final   Mycoplasma pneumoniae NOT DETECTED NOT DETECTED Final    Comment: Performed at [facility], [place]. [address] [place], [place] [address]     Radiology Studies: ECHOCARDIOGRAM COMPLETE  Result Date: 12/16/2022    ECHOCARDIOGRAM REPORT   Patient Name:   RESHONDA DIOS Rayborn Date of Exam: 12/16/2022 Medical Rec #:  [id number]       Height:       64.0 in Accession #:    [id number]      Weight:       253.3 lb Date of Birth:  06/24/1951        BSA:          2.163 m Patient Age:    72 years        BP:           213/108 mmHg Patient Gender: F               HR:           64 bpm. Exam Location:  Inpatient Procedure: 2D Echo, Cardiac Doppler and Color Doppler Indications:    CHF-Acute Diastolic [id number]  History:        Patient has prior history of Echocardiogram examinations, most                 recent 03/28/2020. COPD, Signs/Symptoms:Obesity; Risk                 Factors:Hypertension and Dyslipidemia.  Sonographer:    Luane School RDCS Referring Phys: [id number] [place]  Sonographer Comments: Suboptimal subcostal window. IMPRESSIONS  1. Left ventricular ejection fraction, by estimation, is 65 to 70%. The left ventricle has normal function. The left ventricle has no regional wall motion abnormalities. There is mild left  ventricular hypertrophy. Left ventricular diastolic parameters are consistent with Grade III diastolic dysfunction (restrictive). Elevated left atrial pressure.  2. Right ventricular systolic function is normal. The right ventricular size is normal. There is mildly elevated pulmonary artery systolic pressure.  3. The mitral valve  is normal in structure. Trivial mitral valve regurgitation.  4. The aortic valve was not well visualized. Aortic valve regurgitation is trivial. Moderate aortic valve stenosis. Vmax 3.1 m/s, MG 22 mmHg, AVA 1.5 cm^2, DI 0.46 FINDINGS  Left Ventricle: Left ventricular ejection fraction, by estimation, is 65 to 70%. The left ventricle has normal function. The left ventricle has no regional wall motion abnormalities. The left ventricular internal cavity size was normal in size. There is  mild left ventricular hypertrophy. Left ventricular diastolic parameters are consistent with Grade III diastolic dysfunction (restrictive). Elevated left atrial pressure. Right Ventricle: The right ventricular size is normal. No increase in right ventricular wall thickness. Right ventricular systolic function is normal. There is mildly elevated pulmonary artery systolic pressure. The tricuspid regurgitant velocity is 2.91  m/s, and with an assumed right atrial pressure of 8 mmHg, the estimated right ventricular systolic pressure is [id number] mmHg. Left Atrium: Left atrial size was normal in size. Right Atrium: Right atrial size was normal in size. Pericardium: There is no evidence of pericardial effusion. Mitral Valve: The mitral valve is normal in structure. Trivial mitral valve regurgitation. Tricuspid Valve: The tricuspid valve is normal in structure. Tricuspid valve regurgitation is mild. Aortic Valve: The aortic valve was not well visualized. Aortic valve regurgitation is trivial. Aortic regurgitation PHT measures 782 msec. Moderate aortic stenosis is present. Aortic valve mean gradient measures 21.3 mmHg.  Aortic valve peak gradient measures 38.0 mmHg. Aortic valve area, by VTI measures 1.55 cm. Pulmonic Valve: The pulmonic valve was not well visualized. Pulmonic valve regurgitation is trivial. Aorta: The aortic root and ascending aorta are structurally normal, with no evidence of dilitation. IAS/Shunts: The interatrial septum was not well visualized.  LEFT VENTRICLE PLAX 2D LVIDd:         4.55 cm   Diastology LVIDs:         2.95 cm   LV e' medial:    3.78 cm/s LV PW:         1.45 cm   LV E/e' medial:  38.9 LV IVS:        1.50 cm   LV e' lateral:   7.42 cm/s LVOT diam:     2.00 cm   LV E/e' lateral: 19.8 LV SV:         104 LV SV Index:   48 LVOT Area:     3.14 cm  RIGHT VENTRICLE RV S prime:     11.00 cm/s TAPSE (M-mode): 2.5 cm LEFT ATRIUM           Index        RIGHT ATRIUM           Index LA diam:      4.60 cm 2.13 cm/m   RA Area:     19.70 cm LA Vol (A2C): 46.7 ml 21.59 ml/m  RA Volume:   56.20 ml  25.98 ml/m LA Vol (A4C): 43.5 ml 20.11 ml/m  AORTIC VALVE AV Area (Vmax):    1.40 cm AV Area (Vmean):   1.35 cm AV Area (VTI):     1.55 cm AV Vmax:           308.33 cm/s AV Vmean:          214.333 cm/s AV VTI:            0.674 m AV Peak Grad:      38.0 mmHg AV Mean Grad:      21.3 mmHg LVOT Vmax:  137.00 cm/s LVOT Vmean:        92.200 cm/s LVOT VTI:          0.332 m LVOT/AV VTI ratio: 0.49 AI PHT:            782 msec  AORTA Ao Root diam: 2.90 cm Ao Asc diam:  3.40 cm Ao Desc diam: 2.50 cm MITRAL VALVE                TRICUSPID VALVE MV Area (PHT): 3.17 cm     TR Peak grad:   33.9 mmHg MV Decel Time: 239 msec     TR Vmax:        291.00 cm/s MV E velocity: 147.00 cm/s MV A velocity: 65.50 cm/s   SHUNTS MV E/A ratio:  2.24         Systemic VTI:  0.33 m                             Systemic Diam: 2.00 cm Oswaldo Milian MD Electronically signed by Oswaldo Milian MD Signature Date/Time: 12/16/2022/12:58:07 PM    Final    NM PULMONARY VENT AND PERF (V/Q Scan)  Result Date: 12/15/2022 CLINICAL DATA:   Shortness of breath and dizziness EXAM: NUCLEAR MEDICINE PERFUSION LUNG SCAN TECHNIQUE: Perfusion images were obtained in multiple projections after intravenous injection of radiopharmaceutical. Ventilation scans intentionally deferred if perfusion scan and chest x-ray adequate for interpretation during COVID 19 epidemic. RADIOPHARMACEUTICALS:  4.3 mCi Tc-63mMAA IV COMPARISON:  Chest x-ray earlier 12/15/2022 FINDINGS: No significant perfusion defects.  Enlarged heart. IMPRESSION: Negative perfusion lung scan Electronically Signed   By: AJill SideM.D.   On: 12/15/2022 17:07   DG Chest Port 1 View  Result Date: 12/15/2022 CLINICAL DATA:  SOB EXAM: PORTABLE CHEST 1 VIEW COMPARISON:  August 08, 2020 FINDINGS: The cardiomediastinal silhouette is unchanged and enlarged in contour. No pleural effusion. No pneumothorax. Central vascular prominence with cephalization of the vasculature and mild peribronchial cuffing. IMPRESSION: Constellation of findings are favored to reflect mild pulmonary edema. Electronically Signed   By: SValentino SaxonM.D.   On: 12/15/2022 12:31     Scheduled Meds:  amiodarone  100 mg Oral Daily   amLODipine  5 mg Oral Daily   aspirin  81 mg Oral Daily   atorvastatin  80 mg Oral Daily   carvedilol  6.25 mg Oral BID WC   clopidogrel  75 mg Oral Daily   enoxaparin (LOVENOX) injection  30 mg Subcutaneous Q24H   ipratropium  0.5 mg Nebulization Q6H WA   ipratropium-albuterol  3 mL Nebulization QID   isosorbide mononitrate  60 mg Oral Daily   mometasone-formoterol  2 puff Inhalation BID   pantoprazole  40 mg Oral QHS   [START ON 12/18/2022] predniSONE  30 mg Oral Q breakfast   sodium chloride flush  3 mL Intravenous Q12H   Continuous Infusions:  sodium chloride 50 mL/hr at 12/17/22 1017     LOS: 0 days    Time spent: 328m    PrDomenic PoliteMD [facility]   12/17/2022, 11:16 AM

## 2022-12-18 ENCOUNTER — Inpatient Hospital Stay (HOSPITAL_COMMUNITY): Payer: 59

## 2022-12-18 DIAGNOSIS — J9601 Acute respiratory failure with hypoxia: Secondary | ICD-10-CM | POA: Diagnosis not present

## 2022-12-18 LAB — BASIC METABOLIC PANEL
Anion gap: 7 (ref 5–15)
Anion gap: 9 (ref 5–15)
BUN: 49 mg/dL — ABNORMAL HIGH (ref 8–23)
BUN: 51 mg/dL — ABNORMAL HIGH (ref 8–23)
CO2: 27 mmol/L (ref 22–32)
CO2: 30 mmol/L (ref 22–32)
Calcium: 8.1 mg/dL — ABNORMAL LOW (ref 8.9–10.3)
Calcium: 8.3 mg/dL — ABNORMAL LOW (ref 8.9–10.3)
Chloride: 103 mmol/L (ref 98–111)
Chloride: 100 mmol/L (ref 98–111)
Creatinine, Ser: 2.16 mg/dL — ABNORMAL HIGH (ref 0.44–1.00)
Creatinine, Ser: 2.06 mg/dL — ABNORMAL HIGH (ref 0.44–1.00)
GFR, Estimated: 24 mL/min — ABNORMAL LOW (ref >60)
GFR, Estimated: 25 mL/min — ABNORMAL LOW (ref >60)
Glucose, Bld: 187 mg/dL — ABNORMAL HIGH (ref 70–99)
Glucose, Bld: 130 mg/dL — ABNORMAL HIGH (ref 70–99)
Potassium: 4.4 mmol/L (ref 3.5–5.1)
Potassium: 4.8 mmol/L (ref 3.5–5.1)
Sodium: 137 mmol/L (ref 135–145)
Sodium: 139 mmol/L (ref 135–145)

## 2022-12-18 LAB — CK: Total CK: 117 U/L (ref 38–234)

## 2022-12-18 MED ORDER — SODIUM CHLORIDE 0.9 % IV SOLN: INTRAVENOUS | Status: AC

## 2022-12-18 NOTE — TOC Initial Note (Signed)
Transition of Care [facility]) - Initial/Assessment Note    Patient Details  Name: Sherry Fitzpatrick MRN: [id number] Date of Birth: 17-Nov-1950  Transition of Care [facility]) CM/SW Contact:    Zenon Mayo, RN Phone Number: 12/18/2022, 2:39 PM  Clinical Narrative:                 From home with daughter, patient still drives, copd ex, cret elevated, TOC following.   Expected Discharge Plan: Home/Self Care Barriers to Discharge: Continued Medical Work up   Patient Goals and CMS Choice Patient states their goals for this hospitalization and ongoing recovery are:: return home   Choice offered to / list presented to : NA      Expected Discharge Plan and Services In-house Referral: NA Discharge Planning Services: CM Consult Post Acute Care Choice: NA Living arrangements for the past 2 months: Single Family Home                 DME Arranged: N/A DME Agency: NA       HH Arranged: NA          Prior Living Arrangements/Services Living arrangements for the past 2 months: Single Family Home Lives with:: Adult Children Patient language and need for interpreter reviewed:: Yes Do you feel safe going back to the place where you live?: Yes      Need for Family Participation in Patient Care: Yes (Comment) Care giver support system in place?: Yes (comment) Current home services: DME (walker) Criminal Activity/Legal Involvement Pertinent to Current Situation/Hospitalization: No - Comment as needed  Activities of Daily Living      Permission Sought/Granted                  Emotional Assessment Appearance:: Appears stated age Attitude/Demeanor/Rapport: Engaged Affect (typically observed): Appropriate Orientation: : Oriented to Self, Oriented to Place, Oriented to  Time, Oriented to Situation Alcohol / Substance Use: Not Applicable Psych Involvement: No (comment)  Admission diagnosis:  Shortness of breath [R06.02] Acute respiratory failure with hypoxia (HCC)  [J96.01] COPD exacerbation ([place]) [J44.1] Patient Active Problem List   Diagnosis Date Noted   COPD exacerbation ([place]) 12/17/2022   Acute respiratory failure with hypoxia ([place]) 12/15/2022   Type 2 diabetes mellitus with hyperglycemia, with long-term current use of insulin (HCC)    Hypoalbuminemia due to protein-calorie malnutrition (HCC)    PAF (paroxysmal atrial fibrillation) (HCC)    Chronic systolic congestive heart failure ([place])    Status post coronary artery stent placement    Dysphagia, post-stroke    Normocytic anemia 03/27/2020   History of CVA (cerebrovascular accident) 03/27/2020   COPD (chronic obstructive pulmonary disease) ([place]) 11/22/2019   Morbid obesity ([place]) 10/29/2019   CKD (chronic kidney disease), stage III ([place]) 10/29/2019   Tobacco abuse 07/01/2018   Positive D dimer    Right sided weakness 04/14/2015   Hyperlipidemia    Essential hypertension    Asthma    OSA (obstructive sleep apnea) 05/06/2013   Anxiety and depression 11/20/2011   Esophageal reflux 11/20/2011   PCP:  Glenis Smoker, MD Pharmacy:   [facility] ([place]), [place] - [address] [place] [address] Phone: [phone] Fax: [phone]  [id number] [place] [id number] [id number]: [phone]: [phone]    Social Determinants of Health (SDOH) Social History: SDOH Screenings   Tobacco  Use: Low Risk  (12/15/2022)   SDOH Interventions:     Readmission Risk Interventions    12/18/2022    2:36 PM  Readmission Risk Prevention Plan  Transportation Screening Complete  PCP or Specialist Appt within 5-7 Days Complete  Home Care Screening Complete  Medication Review (RN CM) Complete

## 2022-12-18 NOTE — Progress Notes (Signed)
Physical Therapy Treatment Patient Details Name: Sherry Fitzpatrick MRN: [id number] DOB: May 28, 1951 Today's Date: 12/18/2022   History of Present Illness 72 y.o. female with medical history significant of cognitive impairment, diabetes, diastolic CHF, stroke, A-fib, OSA, GERD, anemia, obesity, hyperlipidemia, CKD 3, asthma, anxiety, depression presenting 12/15/22 with shortness of breath. +pulmonary edema; VQ scan negative; resp panel negative; COPD exacerbation    PT Comments    Pt tolerated treatment well today. Pt was able to ambulate the hallway with no AD and navigate stairs. Pt has met all PT goals and is safe for DC home. Pt would benefit from a mobility referral for continued mobility during acute stay. Reconsult PT if mobility status changes.   Recommendations for follow up therapy are one component of a multi-disciplinary discharge planning process, led by the attending physician.  Recommendations may be updated based on patient status, additional functional criteria and insurance authorization.  Follow Up Recommendations  No PT follow up     Assistance Recommended at Discharge Set up Supervision/Assistance  Patient can return home with the following Assistance with cooking/housework;Direct supervision/assist for medications management;Direct supervision/assist for financial management;Assist for transportation;Help with stairs or ramp for entrance   Equipment Recommendations  None recommended by PT    Recommendations for Other Services       Precautions / Restrictions Precautions Precautions: Other (comment) Precaution Comments: monitor O2 sats Restrictions Weight Bearing Restrictions: No     Mobility  Bed Mobility Overal bed mobility: Modified Independent Bed Mobility: Supine to Sit, Sit to Supine     Supine to sit: Modified independent (Device/Increase time) Sit to supine: Modified independent (Device/Increase time)        Transfers Overall transfer level:  Modified independent Equipment used: None Transfers: Sit to/from Stand Sit to Stand: Modified independent (Device/Increase time)           General transfer comment: no imbalance or dizziness    Ambulation/Gait Ambulation/Gait assistance: Supervision Gait Distance (Feet): 500 Feet (2 seated rest breaks) Assistive device: None Gait Pattern/deviations: Step-through pattern, Decreased stride length, Wide base of support Gait velocity: Decreased         Stairs Stairs: Yes Stairs assistance: Supervision Stair Management: No rails, One rail Left, Forwards, Backwards Number of Stairs: 5 General stair comments: No rails going up, 1 rail coming down   Wheelchair Mobility    Modified Rankin (Stroke Patients Only)       Balance                                            Cognition Arousal/Alertness: Awake/alert Behavior During Therapy: WFL for tasks assessed/performed Overall Cognitive Status: Within Functional Limits for tasks assessed                                 General Comments: per chart, cognitive deficits at baseline. Per pt report, she receives assist with medication management and financial management. May occasionally make simple meal independently. Pt with intermittent slowed processing, but [facility] for basic daily tasks. Oriented to day and date.        Exercises      General Comments General comments (skin integrity, edema, etc.): Pt on 3L at 100% upon arrival. Pt ambulated in hallway on RA and stayed above 94%. Pt had 1 instance of dropping to 87% on RA  during 1st seated rest break however quickly recovered to 94% with pursed lip breathing.      Pertinent Vitals/Pain Pain Assessment Pain Assessment: No/denies pain    Home Living                          Prior Function            PT Goals (current goals can now be found in the care plan section) Progress towards PT goals: Goals met/education completed,  patient discharged from PT    Frequency    Other (Comment) (DCPT)      PT Plan Discharge plan needs to be updated    Co-evaluation              AM-PAC PT "6 Clicks" Mobility   Outcome Measure  Help needed turning from your back to your side while in a flat bed without using bedrails?: None Help needed moving from lying on your back to sitting on the side of a flat bed without using bedrails?: None Help needed moving to and from a bed to a chair (including a wheelchair)?: None Help needed standing up from a chair using your arms (e.g., wheelchair or bedside chair)?: None Help needed to walk in hospital room?: None Help needed climbing 3-5 steps with a railing? : A Little 6 Click Score: 23    End of Session Equipment Utilized During Treatment: Gait belt Activity Tolerance: Patient tolerated treatment well Patient left: in bed;with call bell/phone within reach;with nursing/sitter in room Nurse Communication: Mobility status;Other (comment) (O2 sats) PT Visit Diagnosis: Other abnormalities of gait and mobility (R26.89)     Time: [id number] PT Time Calculation (min) (ACUTE ONLY): 26 min  Charges:  $Gait Training: 23-37 mins                     Shelby Mattocks, PT, DPT Acute Rehab Services [id number]    Viann Shove 12/18/2022, 4:20 PM

## 2022-12-18 NOTE — Progress Notes (Signed)
Pt O2 level decreased into the mid 80 range while sleeping. Placed pt on 2L to achieve O2 goals.

## 2022-12-18 NOTE — Progress Notes (Signed)
PROGRESS NOTE    Sherry Fitzpatrick  [id number] DOB: May 24, 1951 DOA: 12/15/2022 PCP: Glenis Smoker, MD  71/F w/cognitive impairment, diabetes, diastolic CHF, stroke, A-fib, OSA, GERD, anemia, obesity, hyperlipidemia, CKD 3, asthma, anxiety, depression presenting with shortness of breath x few days. EMS was called noted to be saturating in the 80s on room air.  Some improvement following Solu-Medrol, DuoNeb, albuterol and route. In ED,creatinine stable 1.16, Troponin negative x 2. D-dimer elevated 1.31.  Respiratory panel for flu COVID RSV negative.  Chest x-ray showing findings favoring pulmonary edema.  VQ scan negative.  Patient received Lasix and DuoNeb in the ED  Subjective: Feels better, breathing is improving  Assessment and Plan:  Acute respiratory failure with hypoxia COPD/asthma exacerbation -Now suspect this is primarily COPD exacerbation, improved with steroids nebs etc. -Flu, COVID PCR, respiratory virus panel is negative, continue Symbicort and PRN albuterol -Slow prednisone taper, increase activity, wean O2, add incentive spirometry   Chronic diastolic CHF > Last echo in the system was in 2021 with EF 60-65%, indeterminate diastolic function. -2D echo with preserved EF, grade 3 diastolic dysfunction, restrictive cardiomyopathy -Appears euvolemic, worsened creatinine with diuresis, ARB -ARB and diuretic on hold,   AKI -Likely secondary to diuresis, ARB use -creatinine unchanged, ARB and Lasix on hold -Check renal ultrasound, urinalysis, CK   Paroxysmal A-fib - Continue home amiodarone, carvedilol -In sinus rhythm now, not on anticoagulation at baseline, likely secondary to DAPT therapy   CAD > Status post stenting in 2021. - Continue aspirin, Plavix - Continue atorvastatin, carvedilol, Imdur   Mild  cognitive impairment -Apparently started on Namenda recently, patient declines using this   GERD - Continue home PPI   Anemia > Hemoglobin stable at  10.7 - Trend CBC   Hyperlipidemia - Continue home atorvastatin   Anxiety Depression - Listed in chart but not currently on medications for this   Obesity - Noted   DVT prophylaxis:      Lovenox Code Status:              DNR, Okay with trial of intubation Family Communication:     None present Disposition Plan: Home likely 1 to 2 days  Consultants:    Procedures:   Antimicrobials:    Objective: Vitals:   12/18/22 0021 12/18/22 0452 12/18/22 0735 12/18/22 0808  BP: (!) 135/59 (!) 147/72 (!) 151/70   Pulse: 60 62 (!) 58   Resp: 19 19    Temp: 98 F (36.7 C)  97.8 F (36.6 C)   TempSrc: Oral Oral Oral   SpO2: 100%  100% 100%  Weight: 116.4 kg     Height:        Intake/Output Summary (Last 24 hours) at 12/18/2022 1007 Last data filed at 12/18/2022 0459 Gross per 24 hour  Intake 751.24 ml  Output 550 ml  Net 201.24 ml   Filed Weights   12/16/22 0002 12/17/22 0524 12/18/22 0021  Weight: 114.9 kg 117.7 kg 116.4 kg    Examination:  General exam: Obese chronically ill female sitting up in bed, AAOx3, mild cognitive deficits CVS: S1-S2, regular rhythm Lungs: Poor air movement bilaterally otherwise clear Abdomen: Soft, nontender, bowel sounds present Extremities: No edema Skin: No rashes Psychiatry:  Mood & affect appropriate.     Data Reviewed:   CBC: Recent Labs  Lab 12/15/22 1141 12/16/22 0720  WBC 7.0 12.0*  NEUTROABS 4.2  --   HGB 10.7* 10.7*  HCT 34.5* 33.1*  MCV 94.5 92.7  PLT  297 [id number]   Basic Metabolic Panel: Recent Labs  Lab 12/15/22 1141 12/16/22 0720 12/17/22 0040 12/18/22 0049  NA 139 139 138 137  K 4.0 4.0 4.2 4.4  CL 101 102 98 103  CO2 '29 28 30 27  '$ GLUCOSE 111* 155* 202* 187*  BUN 15 29* 40* 49*  CREATININE 1.16* 1.71* 2.16* 2.16*  CALCIUM 8.7* 8.4* 8.4* 8.1*   GFR: Estimated Creatinine Clearance: 29.1 mL/min (A) (by C-G formula based on SCr of 2.16 mg/dL (H)). Liver Function Tests: Recent Labs  Lab 12/15/22 1141  12/16/22 0720  AST 24 17  ALT 30 28  ALKPHOS 56 60  BILITOT 0.9 0.4  PROT 7.6 7.3  ALBUMIN 3.2* 3.1*   No results for input(s): "LIPASE", "AMYLASE" in the last 168 hours. No results for input(s): "AMMONIA" in the last 168 hours. Coagulation Profile: No results for input(s): "INR", "PROTIME" in the last 168 hours. Cardiac Enzymes: No results for input(s): "CKTOTAL", "CKMB", "CKMBINDEX", "TROPONINI" in the last 168 hours. BNP (last 3 results) No results for input(s): "PROBNP" in the last 8760 hours. HbA1C: No results for input(s): "HGBA1C" in the last 72 hours. CBG: No results for input(s): "GLUCAP" in the last 168 hours. Lipid Profile: No results for input(s): "CHOL", "HDL", "LDLCALC", "TRIG", "CHOLHDL", "LDLDIRECT" in the last 72 hours. Thyroid Function Tests: No results for input(s): "TSH", "T4TOTAL", "FREET4", "T3FREE", "THYROIDAB" in the last 72 hours. Anemia Panel: No results for input(s): "VITAMINB12", "FOLATE", "FERRITIN", "TIBC", "IRON", "RETICCTPCT" in the last 72 hours. Urine analysis:    Component Value Date/Time   COLORURINE AMBER (A) 05/28/2020 0834   APPEARANCEUR TURBID (A) 05/28/2020 0834   LABSPEC 1.021 05/28/2020 0834   PHURINE 5.0 05/28/2020 0834   GLUCOSEU NEGATIVE 05/28/2020 0834   HGBUR LARGE (A) 05/28/2020 0834   BILIRUBINUR NEGATIVE 05/28/2020 0834   KETONESUR NEGATIVE 05/28/2020 0834   PROTEINUR 100 (A) 05/28/2020 0834   UROBILINOGEN 0.2 04/14/2015 1451   NITRITE NEGATIVE 05/28/2020 0834   LEUKOCYTESUR MODERATE (A) 05/28/2020 0834   Sepsis Labs: '@LABRCNTIP'$ (procalcitonin:4,lacticidven:4)  ) Recent Results (from the past 240 hour(s))  Resp panel by RT-PCR (RSV, Flu A&B, Covid) Anterior Nasal Swab     Status: None   Collection Time: 12/15/22 11:45 AM   Specimen: Anterior Nasal Swab  Result Value Ref Range Status   SARS Coronavirus 2 by RT PCR NEGATIVE NEGATIVE Final   Influenza A by PCR NEGATIVE NEGATIVE Final   Influenza B by PCR NEGATIVE  NEGATIVE Final    Comment: (NOTE) The Xpert Xpress SARS-CoV-2/FLU/RSV plus assay is intended as an aid in the diagnosis of influenza from Nasopharyngeal swab specimens and should not be used as a sole basis for treatment. Nasal washings and aspirates are unacceptable for Xpert Xpress SARS-CoV-2/FLU/RSV testing.  Fact Sheet for Patients: EntrepreneurPulse.com.au  Fact Sheet for Healthcare Providers: IncredibleEmployment.be  This test is not yet approved or cleared by the [place] FDA and has been authorized for detection and/or diagnosis of SARS-CoV-2 by FDA under an Emergency Use Authorization (EUA). This EUA will remain in effect (meaning this test can be used) for the duration of the COVID-19 declaration under Section 564(b)(1) of the Act, 21 U.S.C. section 360bbb-3(b)(1), unless the authorization is terminated or revoked.     Resp Syncytial Virus by PCR NEGATIVE NEGATIVE Final    Comment: (NOTE) Fact Sheet for Patients: EntrepreneurPulse.com.au  Fact Sheet for Healthcare Providers: IncredibleEmployment.be  This test is not yet approved or cleared by the [place] FDA and has been  authorized for detection and/or diagnosis of SARS-CoV-2 by FDA under an Emergency Use Authorization (EUA). This EUA will remain in effect (meaning this test can be used) for the duration of the COVID-19 declaration under Section 564(b)(1) of the Act, 21 U.S.C. section 360bbb-3(b)(1), unless the authorization is terminated or revoked.  Performed at [facility], [place] [address] [place], [place] [address]   Respiratory (~20 pathogens) panel by PCR     Status: None   Collection Time: 12/15/22 11:45 AM   Specimen: Nasopharyngeal Swab; Respiratory  Result Value Ref Range Status   Adenovirus NOT DETECTED NOT DETECTED Final   Coronavirus 229E NOT DETECTED NOT DETECTED Final    Comment: (NOTE) The Coronavirus on  the Respiratory Panel, DOES NOT test for the novel  Coronavirus (2019 nCoV)    Coronavirus HKU1 NOT DETECTED NOT DETECTED Final   Coronavirus NL63 NOT DETECTED NOT DETECTED Final   Coronavirus OC43 NOT DETECTED NOT DETECTED Final   Metapneumovirus NOT DETECTED NOT DETECTED Final   Rhinovirus / Enterovirus NOT DETECTED NOT DETECTED Final   Influenza A NOT DETECTED NOT DETECTED Final   Influenza B NOT DETECTED NOT DETECTED Final   Parainfluenza Virus 1 NOT DETECTED NOT DETECTED Final   Parainfluenza Virus 2 NOT DETECTED NOT DETECTED Final   Parainfluenza Virus 3 NOT DETECTED NOT DETECTED Final   Parainfluenza Virus 4 NOT DETECTED NOT DETECTED Final   Respiratory Syncytial Virus NOT DETECTED NOT DETECTED Final   Bordetella pertussis NOT DETECTED NOT DETECTED Final   Bordetella Parapertussis NOT DETECTED NOT DETECTED Final   Chlamydophila pneumoniae NOT DETECTED NOT DETECTED Final   Mycoplasma pneumoniae NOT DETECTED NOT DETECTED Final    Comment: Performed at [facility], [place]. [address], [place], [place] [address]     Radiology Studies: US RENAL  Result Date: 12/18/2022 CLINICAL DATA:  Renal dysfunction EXAM: RENAL / URINARY TRACT ULTRASOUND COMPLETE COMPARISON:  CT done on 06/05/2020 FINDINGS: Right Kidney: Renal measurements: 10.2 x 3.8 x 5 cm = volume: 100.8 mL. Echogenicity within normal limits. No mass or hydronephrosis visualized. Left Kidney: Renal measurements: 10.5 x 4.4 x 5.2 cm = volume: 125.7 mL. There is no hydronephrosis. There is a 2.9 x 2.6 cm cyst in left kidney. Bladder: Urinary bladder is not distended. Other: Examination was technically difficult due to patient's body habitus. IMPRESSION: There is no hydronephrosis.  2.9 cm left renal cyst. Electronically Signed   By: Elmer Picker M.D.   On: 12/18/2022 08:55     Scheduled Meds:  amiodarone  100 mg Oral Daily   amLODipine  5 mg Oral Daily   aspirin  81 mg Oral Daily   atorvastatin  80 mg Oral Daily    carvedilol  6.25 mg Oral BID WC   clopidogrel  75 mg Oral Daily   enoxaparin (LOVENOX) injection  30 mg Subcutaneous Q24H   ipratropium-albuterol  3 mL Nebulization BID   isosorbide mononitrate  60 mg Oral Daily   mometasone-formoterol  2 puff Inhalation BID   pantoprazole  40 mg Oral QHS   predniSONE  30 mg Oral Q breakfast   sodium chloride flush  3 mL Intravenous Q12H   Continuous Infusions:     LOS: 1 day    Time spent: 24mn    PDomenic Polite MD [facility]   12/18/2022, 10:07 AM

## 2022-12-19 DIAGNOSIS — J441 Chronic obstructive pulmonary disease with (acute) exacerbation: Secondary | ICD-10-CM | POA: Diagnosis not present

## 2022-12-19 LAB — BASIC METABOLIC PANEL
Anion gap: 6 (ref 5–15)
BUN: 51 mg/dL — ABNORMAL HIGH (ref 8–23)
CO2: 31 mmol/L (ref 22–32)
Calcium: 8.1 mg/dL — ABNORMAL LOW (ref 8.9–10.3)
Chloride: 103 mmol/L (ref 98–111)
Creatinine, Ser: 1.92 mg/dL — ABNORMAL HIGH (ref 0.44–1.00)
GFR, Estimated: 27 mL/min — ABNORMAL LOW (ref >60)
Glucose, Bld: 150 mg/dL — ABNORMAL HIGH (ref 70–99)
Potassium: 4.4 mmol/L (ref 3.5–5.1)
Sodium: 140 mmol/L (ref 135–145)

## 2022-12-19 LAB — CBC
HCT: 35 % — ABNORMAL LOW (ref 36.0–46.0)
Hemoglobin: 10.9 g/dL — ABNORMAL LOW (ref 12.0–15.0)
MCH: 29.9 pg (ref 26.0–34.0)
MCHC: 31.1 g/dL (ref 30.0–36.0)
MCV: 96.2 fL (ref 80.0–100.0)
Platelets: 283 10*3/uL (ref 150–400)
RBC: 3.64 MIL/uL — ABNORMAL LOW (ref 3.87–5.11)
RDW: 16.2 % — ABNORMAL HIGH (ref 11.5–15.5)
WBC: 10.1 10*3/uL (ref 4.0–10.5)
nRBC: 0.2 % (ref 0.0–0.2)

## 2022-12-19 MED ORDER — ENOXAPARIN SODIUM 40 MG/0.4ML IJ SOSY
40.0000 mg | PREFILLED_SYRINGE | INTRAMUSCULAR | Status: DC
  Administered 2022-12-19 – 2022-12-24 (×6): 40 mg via SUBCUTANEOUS
  Filled 2022-12-19 (×6): qty 0.4

## 2022-12-19 MED ORDER — PREDNISONE 20 MG PO TABS
20.0000 mg | ORAL_TABLET | Freq: Every day | ORAL | Status: AC
  Administered 2022-12-20: 20 mg via ORAL
  Filled 2022-12-19: qty 1

## 2022-12-19 NOTE — Plan of Care (Signed)
  Problem: Education: Goal: Knowledge of disease or condition will improve Outcome: Progressing Goal: Knowledge of the prescribed therapeutic regimen will improve Outcome: Progressing Goal: Individualized Educational Video(s) Outcome: Progressing   

## 2022-12-19 NOTE — Progress Notes (Signed)
Nurse requested Mobility Specialist to perform oxygen saturation test with pt which includes removing pt from oxygen both at rest and while ambulating.  Below are the results from that testing.     Patient Saturations on Room Air at Rest = spO2 96%  Patient Saturations on Room Air while Ambulating = sp02 90% .  Rested and performed pursed lip breathing for 1 minute with sp02 at 93%.  At end of testing pt left in room on 0  Liters of oxygen.  Reported results to nurse.   [facility] Specialist Please contact via SecureChat or Rehab office at [phone]

## 2022-12-19 NOTE — Progress Notes (Signed)
PROGRESS NOTE    Sherry Fitzpatrick  [id number] DOB: 10-10-1951 DOA: 12/15/2022 PCP: Glenis Smoker, MD  71/F w/cognitive impairment, diabetes, diastolic CHF, stroke, A-fib, OSA, GERD, anemia, obesity, hyperlipidemia, CKD 3, asthma, anxiety, depression presenting with shortness of breath x few days. EMS was called noted to be saturating in the 80s on room air.  Some improvement following Solu-Medrol, DuoNeb, albuterol and route. In ED,creatinine stable 1.16, Troponin negative x 2. D-dimer elevated 1.31.  Respiratory panel for flu COVID RSV negative.  Chest x-ray showing findings favoring pulmonary edema.  VQ scan negative.  Patient received Lasix and DuoNeb in the ED  Subjective: Feels better, breathing is improving  Assessment and Plan:  Acute respiratory failure with hypoxia COPD/asthma exacerbation -primarily COPD exacerbation, improving on with steroids nebs etc. -Flu, COVID PCR, respiratory virus panel is negative, continue Symbicort and PRN albuterol -Slow prednisone taper, increase activity, -Weaned off O2 today   Chronic diastolic CHF > Last echo in the system was in 2021 with EF 60-65%, indeterminate diastolic function. -2D echo with preserved EF, grade 3 diastolic dysfunction, restrictive cardiomyopathy -Appears euvolemic, worsened creatinine with diuresis, ARB -ARB and diuretic on hold, creatinine improving, resume Lasix in 1 to 2 days  AKI -Likely secondary to diuresis, ARB use -creatinine finally improving, ARB and Lasix on hold -Renal without hydronephrosis -BMP in a.m., resume diuretics likely tomorrow   Paroxysmal A-fib - Continue home amiodarone, carvedilol -In sinus rhythm now, not on anticoagulation at baseline, likely secondary to DAPT therapy   CAD > With PCI/stenting in 2021. - Continue aspirin, Plavix - Continue atorvastatin, carvedilol, Imdur   Mild  cognitive impairment -Apparently started on Namenda recently, patient declines using this    GERD - Continue home PPI   Anemia -Hemoglobin is stable, monitor   Hyperlipidemia - Continue home atorvastatin   Obesity - Noted   DVT prophylaxis:      Lovenox Code Status:              DNR, Okay with trial of intubation Family Communication:     None present Disposition Plan: Home likely 1 to 2 days  Consultants:    Procedures:   Antimicrobials:    Objective: Vitals:   12/19/22 0643 12/19/22 0809 12/19/22 0810 12/19/22 0821  BP:      Pulse:      Resp:      Temp:      TempSrc:      SpO2:  90% 100%   Weight: 120.6 kg   121 kg  Height:        Intake/Output Summary (Last 24 hours) at 12/19/2022 1111 Last data filed at 12/19/2022 [id number] Gross per 24 hour  Intake 853.94 ml  Output 700 ml  Net 153.94 ml   Filed Weights   12/18/22 0021 12/19/22 0643 12/19/22 0821  Weight: 116.4 kg 120.6 kg 121 kg    Examination:  General exam: Obese chronically ill female laying in bed, AAOx3, mild cognitive deficits CVS: S1-S2, regular rhythm Lungs: Poor air movement bilaterally otherwise clear Abdomen: Soft, nontender, bowel sounds present Extremities: No edema  Skin: No rashes Psychiatry:  Mood & affect appropriate.     Data Reviewed:   CBC: Recent Labs  Lab 12/15/22 1141 12/16/22 0720 12/19/22 0111  WBC 7.0 12.0* 10.1  NEUTROABS 4.2  --   --   HGB 10.7* 10.7* 10.9*  HCT 34.5* 33.1* 35.0*  MCV 94.5 92.7 96.2  PLT 297 281 [id number]   Basic Metabolic Panel: Recent Labs  Lab 12/16/22 0720 12/17/22 0040 12/18/22 0049 12/18/22 1240 12/19/22 0111  NA 139 138 137 139 140  K 4.0 4.2 4.4 4.8 4.4  CL 102 98 103 100 103  CO2 '28 30 27 30 31  '$ GLUCOSE 155* 202* 187* 130* 150*  BUN 29* 40* 49* 51* 51*  CREATININE 1.71* 2.16* 2.16* 2.06* 1.92*  CALCIUM 8.4* 8.4* 8.1* 8.3* 8.1*   GFR: Estimated Creatinine Clearance: 33.5 mL/min (A) (by C-G formula based on SCr of 1.92 mg/dL (H)). Liver Function Tests: Recent Labs  Lab 12/15/22 1141 12/16/22 0720  AST 24 17  ALT  30 28  ALKPHOS 56 60  BILITOT 0.9 0.4  PROT 7.6 7.3  ALBUMIN 3.2* 3.1*   No results for input(s): "LIPASE", "AMYLASE" in the last 168 hours. No results for input(s): "AMMONIA" in the last 168 hours. Coagulation Profile: No results for input(s): "INR", "PROTIME" in the last 168 hours. Cardiac Enzymes: Recent Labs  Lab 12/18/22 1159  CKTOTAL 117   BNP (last 3 results) No results for input(s): "PROBNP" in the last 8760 hours. HbA1C: No results for input(s): "HGBA1C" in the last 72 hours. CBG: No results for input(s): "GLUCAP" in the last 168 hours. Lipid Profile: No results for input(s): "CHOL", "HDL", "LDLCALC", "TRIG", "CHOLHDL", "LDLDIRECT" in the last 72 hours. Thyroid Function Tests: No results for input(s): "TSH", "T4TOTAL", "FREET4", "T3FREE", "THYROIDAB" in the last 72 hours. Anemia Panel: No results for input(s): "VITAMINB12", "FOLATE", "FERRITIN", "TIBC", "IRON", "RETICCTPCT" in the last 72 hours. Urine analysis:    Component Value Date/Time   COLORURINE AMBER (A) 05/28/2020 0834   APPEARANCEUR TURBID (A) 05/28/2020 0834   LABSPEC 1.021 05/28/2020 0834   PHURINE 5.0 05/28/2020 0834   GLUCOSEU NEGATIVE 05/28/2020 0834   HGBUR LARGE (A) 05/28/2020 0834   BILIRUBINUR NEGATIVE 05/28/2020 0834   KETONESUR NEGATIVE 05/28/2020 0834   PROTEINUR 100 (A) 05/28/2020 0834   UROBILINOGEN 0.2 04/14/2015 1451   NITRITE NEGATIVE 05/28/2020 0834   LEUKOCYTESUR MODERATE (A) 05/28/2020 0834   Sepsis Labs: '@LABRCNTIP'$ (procalcitonin:4,lacticidven:4)  ) Recent Results (from the past 240 hour(s))  Resp panel by RT-PCR (RSV, Flu A&B, Covid) Anterior Nasal Swab     Status: None   Collection Time: 12/15/22 11:45 AM   Specimen: Anterior Nasal Swab  Result Value Ref Range Status   SARS Coronavirus 2 by RT PCR NEGATIVE NEGATIVE Final   Influenza A by PCR NEGATIVE NEGATIVE Final   Influenza B by PCR NEGATIVE NEGATIVE Final    Comment: (NOTE) The Xpert Xpress SARS-CoV-2/FLU/RSV plus  assay is intended as an aid in the diagnosis of influenza from Nasopharyngeal swab specimens and should not be used as a sole basis for treatment. Nasal washings and aspirates are unacceptable for Xpert Xpress SARS-CoV-2/FLU/RSV testing.  Fact Sheet for Patients: EntrepreneurPulse.com.au  Fact Sheet for Healthcare Providers: IncredibleEmployment.be  This test is not yet approved or cleared by the [place] FDA and has been authorized for detection and/or diagnosis of SARS-CoV-2 by FDA under an Emergency Use Authorization (EUA). This EUA will remain in effect (meaning this test can be used) for the duration of the COVID-19 declaration under Section 564(b)(1) of the Act, 21 U.S.C. section 360bbb-3(b)(1), unless the authorization is terminated or revoked.     Resp Syncytial Virus by PCR NEGATIVE NEGATIVE Final    Comment: (NOTE) Fact Sheet for Patients: EntrepreneurPulse.com.au  Fact Sheet for Healthcare Providers: IncredibleEmployment.be  This test is not yet approved or cleared by the [place] and has been authorized for  detection and/or diagnosis of SARS-CoV-2 by FDA under an Emergency Use Authorization (EUA). This EUA will remain in effect (meaning this test can be used) for the duration of the COVID-19 declaration under Section 564(b)(1) of the Act, 21 U.S.C. section 360bbb-3(b)(1), unless the authorization is terminated or revoked.  Performed at [facility], [place] [address] [place], [place] [address]   Respiratory (~20 pathogens) panel by PCR     Status: None   Collection Time: 12/15/22 11:45 AM   Specimen: Nasopharyngeal Swab; Respiratory  Result Value Ref Range Status   Adenovirus NOT DETECTED NOT DETECTED Final   Coronavirus 229E NOT DETECTED NOT DETECTED Final    Comment: (NOTE) The Coronavirus on the Respiratory Panel, DOES NOT test for the novel  Coronavirus (2019 nCoV)     Coronavirus HKU1 NOT DETECTED NOT DETECTED Final   Coronavirus NL63 NOT DETECTED NOT DETECTED Final   Coronavirus OC43 NOT DETECTED NOT DETECTED Final   Metapneumovirus NOT DETECTED NOT DETECTED Final   Rhinovirus / Enterovirus NOT DETECTED NOT DETECTED Final   Influenza A NOT DETECTED NOT DETECTED Final   Influenza B NOT DETECTED NOT DETECTED Final   Parainfluenza Virus 1 NOT DETECTED NOT DETECTED Final   Parainfluenza Virus 2 NOT DETECTED NOT DETECTED Final   Parainfluenza Virus 3 NOT DETECTED NOT DETECTED Final   Parainfluenza Virus 4 NOT DETECTED NOT DETECTED Final   Respiratory Syncytial Virus NOT DETECTED NOT DETECTED Final   Bordetella pertussis NOT DETECTED NOT DETECTED Final   Bordetella Parapertussis NOT DETECTED NOT DETECTED Final   Chlamydophila pneumoniae NOT DETECTED NOT DETECTED Final   Mycoplasma pneumoniae NOT DETECTED NOT DETECTED Final    Comment: Performed at [facility], [place]. [address], [place], [place] [address]     Radiology Studies: US RENAL  Result Date: 12/18/2022 CLINICAL DATA:  Renal dysfunction EXAM: RENAL / URINARY TRACT ULTRASOUND COMPLETE COMPARISON:  CT done on 06/05/2020 FINDINGS: Right Kidney: Renal measurements: 10.2 x 3.8 x 5 cm = volume: 100.8 mL. Echogenicity within normal limits. No mass or hydronephrosis visualized. Left Kidney: Renal measurements: 10.5 x 4.4 x 5.2 cm = volume: 125.7 mL. There is no hydronephrosis. There is a 2.9 x 2.6 cm cyst in left kidney. Bladder: Urinary bladder is not distended. Other: Examination was technically difficult due to patient's body habitus. IMPRESSION: There is no hydronephrosis.  2.9 cm left renal cyst. Electronically Signed   By: Elmer Picker M.D.   On: 12/18/2022 08:55     Scheduled Meds:  amiodarone  100 mg Oral Daily   amLODipine  5 mg Oral Daily   aspirin  81 mg Oral Daily   atorvastatin  80 mg Oral Daily   carvedilol  6.25 mg Oral BID WC   clopidogrel  75 mg Oral Daily   enoxaparin  (LOVENOX) injection  30 mg Subcutaneous Q24H   ipratropium-albuterol  3 mL Nebulization BID   isosorbide mononitrate  60 mg Oral Daily   mometasone-formoterol  2 puff Inhalation BID   pantoprazole  40 mg Oral QHS   predniSONE  30 mg Oral Q breakfast   sodium chloride flush  3 mL Intravenous Q12H   Continuous Infusions:     LOS: 2 days    Time spent: 70mn    PDomenic Polite MD [facility]   12/19/2022, 11:11 AM

## 2022-12-20 ENCOUNTER — Inpatient Hospital Stay (HOSPITAL_COMMUNITY): Payer: 59

## 2022-12-20 DIAGNOSIS — J441 Chronic obstructive pulmonary disease with (acute) exacerbation: Secondary | ICD-10-CM | POA: Diagnosis not present

## 2022-12-20 LAB — BASIC METABOLIC PANEL
Anion gap: 8 (ref 5–15)
BUN: 43 mg/dL — ABNORMAL HIGH (ref 8–23)
CO2: 29 mmol/L (ref 22–32)
Calcium: 8.3 mg/dL — ABNORMAL LOW (ref 8.9–10.3)
Chloride: 101 mmol/L (ref 98–111)
Creatinine, Ser: 1.66 mg/dL — ABNORMAL HIGH (ref 0.44–1.00)
GFR, Estimated: 32 mL/min — ABNORMAL LOW (ref >60)
Glucose, Bld: 188 mg/dL — ABNORMAL HIGH (ref 70–99)
Potassium: 4.7 mmol/L (ref 3.5–5.1)
Sodium: 138 mmol/L (ref 135–145)

## 2022-12-20 LAB — BRAIN NATRIURETIC PEPTIDE: B Natriuretic Peptide: 232.8 pg/mL — ABNORMAL HIGH (ref 0.0–100.0)

## 2022-12-20 LAB — TROPONIN I (HIGH SENSITIVITY)
Troponin I (High Sensitivity): 10 ng/L (ref <18)
Troponin I (High Sensitivity): 9 ng/L (ref <18)

## 2022-12-20 MED ORDER — FUROSEMIDE 10 MG/ML IJ SOLN
40.0000 mg | Freq: Once | INTRAMUSCULAR | Status: AC
  Administered 2022-12-20: 40 mg via INTRAVENOUS
  Filled 2022-12-20: qty 4

## 2022-12-20 NOTE — Progress Notes (Signed)
PROGRESS NOTE    Sherry Fitzpatrick  [id number] DOB: Sep 11, 1951 DOA: 12/15/2022 PCP: Glenis Smoker, MD  Chief Complaint  Patient presents with   Shortness of Breath    Brief Narrative:   71/F w/cognitive impairment, diabetes, diastolic CHF, stroke, Shatoria Fitzpatrick-fib, OSA, GERD, anemia, obesity, hyperlipidemia, CKD 3, asthma, anxiety, depression presenting with shortness of breath x few days. EMS was called noted to be saturating in the 80s on room air.  Some improvement following Solu-Medrol, DuoNeb, albuterol and route. In ED,creatinine stable 1.16, Troponin negative x 2. D-dimer elevated 1.31.  Respiratory panel for flu COVID RSV negative.  Chest x-ray showing findings favoring pulmonary edema.  VQ scan negative.  Patient received Lasix and DuoNeb in the ED   Assessment & Plan:   Principal Problem:   COPD exacerbation ([place]) Active Problems:   OSA (obstructive sleep apnea)   Hyperlipidemia   Essential hypertension   Asthma   Morbid obesity (HCC)   CKD (chronic kidney disease), stage III (HCC)   COPD (chronic obstructive pulmonary disease) (HCC)   Normocytic anemia   History of CVA (cerebrovascular accident)   Chronic systolic congestive heart failure (HCC)   Status post coronary artery stent placement   PAF (paroxysmal atrial fibrillation) (HCC)   Type 2 diabetes mellitus with hyperglycemia, with long-term current use of insulin (HCC)   Anxiety and depression   Esophageal reflux   Acute respiratory failure with hypoxia (HCC)  Chest Pain Noted today after walking  Negative troponin x2, EKG with T wave inversion in inferior leads CXR With increasing vascular congestion and trace edema With negative trop, ACS r/o.  Will continue to monitor, treat volume overload and COPD exacerbation which could be contributing to sx of chest tightness  Acute respiratory failure with hypoxia COPD/asthma exacerbation -primarily COPD exacerbation, improving on with steroids nebs etc. -Flu, COVID  PCR, respiratory virus panel is negative, continue Symbicort and PRN albuterol -Slow prednisone taper, increase activity, -Weaned off O2 today   Chronic diastolic CHF > Last echo in the system was in 2021 with EF 60-65%, indeterminate diastolic function. -2D echo with preserved EF, grade 3 diastolic dysfunction, restrictive cardiomyopathy -CXR with increased vascular congestion and trace edema -hold ARB, restart lasix IV daily and follow    AKI -Likely secondary to diuresis, ARB use -creatinine finally improving, ARB on hold -will restart lasix given CXR -Renal without hydronephrosis -BMP in Sherry Fitzpatrick.m., resume diuretics likely tomorrow   Paroxysmal [name]-fib - Continue home amiodarone, carvedilol -In sinus rhythm now, not on anticoagulation at baseline, likely secondary to DAPT therapy   CAD > With PCI/stenting in 2021. - Continue aspirin, Plavix - Continue atorvastatin, carvedilol, Imdur   Mild  cognitive impairment -Apparently started on Namenda recently, patient declines using this   GERD - Continue home PPI   Anemia -Hemoglobin is stable, monitor   Hyperlipidemia - Continue home atorvastatin   Obesity - Noted    DVT prophylaxis: lovenox Code Status: full Family Communication: none Disposition:   Status is: Inpatient Remains inpatient appropriate because: continued inpatient w/u pending   Consultants:  none  Procedures:  Echo IMPRESSIONS     1. Left ventricular ejection fraction, by estimation, is 65 to 70%. The  left ventricle has normal function. The left ventricle has no regional  wall motion abnormalities. There is mild left ventricular hypertrophy.  Left ventricular diastolic parameters  are consistent with Grade III diastolic dysfunction (restrictive).  Elevated left atrial pressure.   2. Right ventricular systolic function is normal. The  right ventricular  size is normal. There is mildly elevated pulmonary artery systolic  pressure.   3. The mitral  valve is normal in structure. Trivial mitral valve  regurgitation.   4. The aortic valve was not well visualized. Aortic valve regurgitation  is trivial. Moderate aortic valve stenosis. Vmax 3.1 m/s, MG 22 mmHg, AVA  1.5 cm^2, DI 0.46    Antimicrobials:  Anti-infectives (From admission, onward)    None       Subjective: C/o chest tightness/pressure after walking  Objective: Vitals:   12/20/22 0831 12/20/22 1036 12/20/22 1057 12/20/22 1312  BP:  (!) 189/85 (!) 158/83 (!) 145/68  Pulse: (!) 59  (!) 53 60  Resp: 16  (!) 26 18  Temp:   98.1 F (36.7 C) 98.3 F (36.8 C)  TempSrc:   Oral Oral  SpO2: 98% 98% 100% 93%  Weight:      Height:        Intake/Output Summary (Last 24 hours) at 12/20/2022 1420 Last data filed at 12/20/2022 1300 Gross per 24 hour  Intake 480 ml  Output 1000 ml  Net -520 ml   Filed Weights   12/19/22 0643 12/19/22 0821 12/20/22 0259  Weight: 120.6 kg 121 kg 122.2 kg    Examination:  General exam: Appears calm and comfortable  Respiratory system: diminished, no clear adventitious lung sounds on anterior exam Cardiovascular system: RRR - pain with palpation of chest Gastrointestinal system: Abdomen is nondistended, soft and nontender.  Central nervous system: Alert and oriented. No focal neurological deficits. Extremities: no LEE   Data Reviewed: I have personally reviewed following labs and imaging studies  CBC: Recent Labs  Lab 12/15/22 1141 12/16/22 0720 12/19/22 0111  WBC 7.0 12.0* 10.1  NEUTROABS 4.2  --   --   HGB 10.7* 10.7* 10.9*  HCT 34.5* 33.1* 35.0*  MCV 94.5 92.7 96.2  PLT 297 281 [id number]    Basic Metabolic Panel: Recent Labs  Lab 12/17/22 0040 12/18/22 0049 12/18/22 1240 12/19/22 0111 12/20/22 0105  NA 138 137 139 140 138  K 4.2 4.4 4.8 4.4 4.7  CL 98 103 100 103 101  CO2 '30 27 30 31 29  '$ GLUCOSE 202* 187* 130* 150* 188*  BUN 40* 49* 51* 51* 43*  CREATININE 2.16* 2.16* 2.06* 1.92* 1.66*  CALCIUM 8.4* 8.1* 8.3*  8.1* 8.3*    GFR: Estimated Creatinine Clearance: 38.9 mL/min ([name]) (by C-G formula based on SCr of 1.66 mg/dL (H)).  Liver Function Tests: Recent Labs  Lab 12/15/22 1141 12/16/22 0720  AST 24 17  ALT 30 28  ALKPHOS 56 60  BILITOT 0.9 0.4  PROT 7.6 7.3  ALBUMIN 3.2* 3.1*    CBG: No results for input(s): "GLUCAP" in the last 168 hours.   Recent Results (from the past 240 hour(s))  Resp panel by RT-PCR (RSV, Flu Malachy Coleman&B, Covid) Anterior Nasal Swab     Status: None   Collection Time: 12/15/22 11:45 AM   Specimen: Anterior Nasal Swab  Result Value Ref Range Status   SARS Coronavirus 2 by RT PCR NEGATIVE NEGATIVE Final   Influenza Zakya Halabi by PCR NEGATIVE NEGATIVE Final   Influenza B by PCR NEGATIVE NEGATIVE Final    Comment: (NOTE) The Xpert Xpress SARS-CoV-2/FLU/RSV plus assay is intended as an aid in the diagnosis of influenza from Nasopharyngeal swab specimens and should not be used as Acsa Estey sole basis for treatment. Nasal washings and aspirates are unacceptable for Xpert Xpress SARS-CoV-2/FLU/RSV testing.  Fact Sheet for  Patients: EntrepreneurPulse.com.au  Fact Sheet for Healthcare Providers: IncredibleEmployment.be  This test is not yet approved or cleared by the [place] FDA and has been authorized for detection and/or diagnosis of SARS-CoV-2 by FDA under an Emergency Use Authorization (EUA). This EUA will remain in effect (meaning this test can be used) for the duration of the COVID-19 declaration under Section 564(b)(1) of the Act, 21 U.S.C. section 360bbb-3(b)(1), unless the authorization is terminated or revoked.     Resp Syncytial Virus by PCR NEGATIVE NEGATIVE Final    Comment: (NOTE) Fact Sheet for Patients: EntrepreneurPulse.com.au  Fact Sheet for Healthcare Providers: IncredibleEmployment.be  This test is not yet approved or cleared by the [place] FDA and has been authorized for  detection and/or diagnosis of SARS-CoV-2 by FDA under an Emergency Use Authorization (EUA). This EUA will remain in effect (meaning this test can be used) for the duration of the COVID-19 declaration under Section 564(b)(1) of the Act, 21 U.S.C. section 360bbb-3(b)(1), unless the authorization is terminated or revoked.  Performed at [facility], [place] [address], [place], [place] [address]   Respiratory (~20 pathogens) panel by PCR     Status: None   Collection Time: 12/15/22 11:45 AM   Specimen: Nasopharyngeal Swab; Respiratory  Result Value Ref Range Status   Adenovirus NOT DETECTED NOT DETECTED Final   Coronavirus 229E NOT DETECTED NOT DETECTED Final    Comment: (NOTE) The Coronavirus on the Respiratory Panel, DOES NOT test for the novel  Coronavirus (2019 nCoV)    Coronavirus HKU1 NOT DETECTED NOT DETECTED Final   Coronavirus NL63 NOT DETECTED NOT DETECTED Final   Coronavirus OC43 NOT DETECTED NOT DETECTED Final   Metapneumovirus NOT DETECTED NOT DETECTED Final   Rhinovirus / Enterovirus NOT DETECTED NOT DETECTED Final   Influenza Walton Digilio NOT DETECTED NOT DETECTED Final   Influenza B NOT DETECTED NOT DETECTED Final   Parainfluenza Virus 1 NOT DETECTED NOT DETECTED Final   Parainfluenza Virus 2 NOT DETECTED NOT DETECTED Final   Parainfluenza Virus 3 NOT DETECTED NOT DETECTED Final   Parainfluenza Virus 4 NOT DETECTED NOT DETECTED Final   Respiratory Syncytial Virus NOT DETECTED NOT DETECTED Final   Bordetella pertussis NOT DETECTED NOT DETECTED Final   Bordetella Parapertussis NOT DETECTED NOT DETECTED Final   Chlamydophila pneumoniae NOT DETECTED NOT DETECTED Final   Mycoplasma pneumoniae NOT DETECTED NOT DETECTED Final    Comment: Performed at [facility], [place]. [address], [place], [place] [address]         Radiology Studies: DG CHEST PORT 1 VIEW  Result Date: 12/20/2022 CLINICAL DATA:  Shortness of breath EXAM: PORTABLE CHEST 1 VIEW COMPARISON:  X-ray  12/15/2022 FINDINGS: Enlarged cardiopericardial silhouette with vascular congestion and trace edema. No pneumothorax or effusion. No consolidation. The left inferior costophrenic angle is clipped off the edge of the film. Calcified aorta. Overlapping cardiac leads. Film is under penetrated. Degenerative changes. IMPRESSION: Enlarged cardiopericardial silhouette with increasing vascular congestion and trace edema Electronically Signed   By: Jill Side M.D.   On: 12/20/2022 11:49        Scheduled Meds:  amiodarone  100 mg Oral Daily   amLODipine  5 mg Oral Daily   aspirin  81 mg Oral Daily   atorvastatin  80 mg Oral Daily   carvedilol  6.25 mg Oral BID WC   clopidogrel  75 mg Oral Daily   enoxaparin (LOVENOX) injection  40 mg Subcutaneous Q24H   furosemide  40 mg  Intravenous Once   ipratropium-albuterol  3 mL Nebulization BID   isosorbide mononitrate  60 mg Oral Daily   mometasone-formoterol  2 puff Inhalation BID   pantoprazole  40 mg Oral QHS   sodium chloride flush  3 mL Intravenous Q12H   Continuous Infusions:   LOS: 3 days    Time spent: over 30 min    Fayrene Helper, MD [facility]   To contact the attending provider between 7A-7P or the covering provider during after hours 7P-7A, please log into the web site [email or web address] and access using universal  password for that web site. If you do not have the password, please call the hospital operator.  12/20/2022, 2:20 PM

## 2022-12-20 NOTE — Progress Notes (Signed)
Nurse requested Mobility Specialist to perform oxygen saturation test with pt which includes removing pt from oxygen both at rest and while ambulating.  Below are the results from that testing.     Patient Saturations on Room Air at Rest = spO2 94%  Patient Saturations on Room Air while Ambulating = sp02 89% .  Rested and performed pursed lip breathing for 1 minute with sp02 at 93%.  At end of testing pt left in room on 1  Liters of oxygen.  Reported results to nurse.   [place] Specialist Please contact via SecureChat or Rehab office at [phone]

## 2022-12-20 NOTE — Progress Notes (Signed)
Occupational Therapy Treatment Patient Details Name: Sherry Fitzpatrick MRN: [id number] DOB: 12-02-1950 Today's Date: 12/20/2022   History of present illness 72 y.o. female with medical history significant of cognitive impairment, diabetes, diastolic CHF, stroke, A-fib, OSA, GERD, anemia, obesity, hyperlipidemia, CKD 3, asthma, anxiety, depression presenting 12/15/22 with shortness of breath. +pulmonary edema; VQ scan negative; resp panel negative; COPD exacerbation   OT comments  Pt progressing good with patient focused goals. Pt completing functional ambulation in room with SBA and no AD, return demonstrated use of pursed lip breathing to manage respiratory rate. Pt states she uses sock aid to don socks and was able to complete self-care with Supervision standing at sink. Pt would benefit from continued skilled acute OT services to address deficits listed and assist with eventual transition to next level of care. No post acute OT follow-up recommended at this time.    Recommendations for follow up therapy are one component of a multi-disciplinary discharge planning process, led by the attending physician.  Recommendations may be updated based on patient status, additional functional criteria and insurance authorization.    Follow Up Recommendations  No OT follow up     Assistance Recommended at Discharge Set up Supervision/Assistance  Patient can return home with the following  Assistance with cooking/housework   Equipment Recommendations  None recommended by OT    Recommendations for Other Services      Precautions / Restrictions Precautions Precautions: Fall Precaution Comments: Check 02 Sats Restrictions Weight Bearing Restrictions: No       Mobility Bed Mobility         Supine to sit: HOB elevated, Supervision          Transfers Overall transfer level: Needs assistance Equipment used: None Transfers: Sit to/from Stand Sit to Stand: Supervision           General  transfer comment: Chair t/f Supervision     Balance Overall balance assessment: Needs assistance Sitting-balance support: Bilateral upper extremity supported, Feet supported Sitting balance-Leahy Scale: Normal Sitting balance - Comments: EOB sitting balance Supervision   Standing balance support: During functional activity Standing balance-Leahy Scale: Good Standing balance comment: Standing balance during functional ambulation SBA                           ADL either performed or assessed with clinical judgement   ADL Overall ADL's : Modified independent     Grooming: Wash/dry face;Modified independent;Standing (BUEs supported on sink)               Lower Body Dressing: Modified independent;Bed level Lower Body Dressing Details (indicate cue type and reason): Donned socks seated EOB with Mod I using bed to support feet.             Functional mobility during ADLs: Supervision/safety      Extremity/Trunk Assessment              Vision Baseline Vision/History: 0 No visual deficits Ability to See in Adequate Light: 0 Adequate Vision Assessment?: No apparent visual deficits   Perception Perception Perception: Not tested   Praxis Praxis Praxis: Intact    Cognition Arousal/Alertness: Awake/alert Behavior During Therapy: WFL for tasks assessed/performed Overall Cognitive Status: Within Functional Limits for tasks assessed  Exercises      Shoulder Instructions       General Comments  Pt O2 remained above 90% RA throughout functional activities.    Pertinent Vitals/ Pain       Pain Assessment Pain Assessment: No/denies pain  Home Living                                          Prior Functioning/Environment              Frequency  Min 2X/week        Progress Toward Goals  OT Goals(current goals can now be found in the care plan section)  Progress  towards OT goals: Progressing toward goals  Acute Rehab OT Goals Patient Stated Goal: Return home OT Goal Formulation: With patient Time For Goal Achievement: 12/29/22 Potential to Achieve Goals: Good  Plan Discharge plan remains appropriate;Frequency remains appropriate    Co-evaluation                 AM-PAC OT "6 Clicks" Daily Activity     Outcome Measure   Help from another person eating meals?: None Help from another person taking care of personal grooming?: None Help from another person toileting, which includes using toliet, bedpan, or urinal?: None Help from another person bathing (including washing, rinsing, drying)?: None Help from another person to put on and taking off regular upper body clothing?: None Help from another person to put on and taking off regular lower body clothing?: A Little 6 Click Score: 23    End of Session Equipment Utilized During Treatment: Gait belt  OT Visit Diagnosis:  (SOB)   Activity Tolerance Patient tolerated treatment well   Patient Left in chair;with call bell/phone within reach   Nurse Communication Mobility status        Time: 1203-1227 OT Time Calculation (min): 24 min  Charges: OT General Charges $OT Visit: 1 Visit OT Treatments $Self Care/Home Management : 8-22 mins $Therapeutic Activity: 8-22 mins  Elnoria Howard, OT  Cori Razor 12/20/2022, 12:49 PM

## 2022-12-20 NOTE — Care Management Important Message (Signed)
Important Message  Patient Details  Name: Sherry Fitzpatrick MRN: [id number] Date of Birth: 05-Apr-1951   Medicare Important Message Given:  Yes     [name] 12/20/2022, 8:59 AM

## 2022-12-20 NOTE — Progress Notes (Signed)
MEWS Progress Note  Patient Details Name: Sherry Fitzpatrick MRN: [id number] DOB: [birth year] Today's Date: 12/20/2022   MEWS Flowsheet Documentation:  Assess: MEWS Score Temp: 98.3 F (36.8 C) BP: (!) 145/68 MAP (mmHg): 91 Pulse Rate: 60 ECG Heart Rate: (!) 58 Resp: 18 Level of Consciousness: Alert SpO2: 93 % O2 Device: Room Air, Nasal Cannula O2 Flow Rate (L/min): 1 L/min FiO2 (%): 28 % Assess: MEWS Score MEWS Temp: 0 MEWS Systolic: 0 MEWS Pulse: 0 MEWS RR: 0 MEWS LOC: 0 MEWS Score: 0 MEWS Score Color: Green Assess: SIRS CRITERIA SIRS Temperature : 0 SIRS Respirations : 0 SIRS Pulse: 0 SIRS WBC: 0 SIRS Score Sum : 0 SIRS Temperature : 0 SIRS Pulse: 0 SIRS Respirations : 0 SIRS WBC: 0 SIRS Score Sum : 0 Assess: if the MEWS score is Yellow or Red Were vital signs taken at a resting state?: Yes Focused Assessment: Change from prior assessment (see assessment flowsheet) Does the patient meet 2 or more of the SIRS criteria?: No MEWS guidelines implemented : Yes, yellow Treat MEWS Interventions: Considered administering scheduled or prn medications/treatments as ordered Take Vital Signs Increase Vital Sign Frequency : Yellow: Q2hr x1, continue Q4hrs until patient remains green for 12hrs Escalate MEWS: Escalate: Yellow: Discuss with charge nurse and consider notifying provider and/or RRT Notify: Charge Nurse/RN Name of Charge Nurse/RN Notified: Beverlee Nims, RN Provider Notification Provider Name/Title: Dr. Florene Glen Date Provider Notified: 12/20/22 Time Provider Notified: [id number] Method of Notification: Face-to-face Notification Reason:  (Complaint of chest and left arm pain) Provider response: At bedside Date of Provider Response: 12/20/22 Time of Provider Response: 1057 Notify: Rapid Response Date Rapid Response Notified: 12/20/22 Time Rapid Response Notified: [place] Cassadie Pankonin 12/20/2022, 2:29 PM

## 2022-12-20 NOTE — Plan of Care (Signed)
  Problem: Education: Goal: Knowledge of disease or condition will improve Outcome: Progressing Goal: Knowledge of the prescribed therapeutic regimen will improve Outcome: Progressing Goal: Individualized Educational Video(s) Outcome: Progressing   

## 2022-12-21 ENCOUNTER — Inpatient Hospital Stay (HOSPITAL_COMMUNITY): Payer: 59

## 2022-12-21 DIAGNOSIS — J441 Chronic obstructive pulmonary disease with (acute) exacerbation: Secondary | ICD-10-CM | POA: Diagnosis not present

## 2022-12-21 LAB — CBC
HCT: 32.7 % — ABNORMAL LOW (ref 36.0–46.0)
Hemoglobin: 10.4 g/dL — ABNORMAL LOW (ref 12.0–15.0)
MCH: 30.1 pg (ref 26.0–34.0)
MCHC: 31.8 g/dL (ref 30.0–36.0)
MCV: 94.8 fL (ref 80.0–100.0)
Platelets: 262 10*3/uL (ref 150–400)
RBC: 3.45 MIL/uL — ABNORMAL LOW (ref 3.87–5.11)
RDW: 16 % — ABNORMAL HIGH (ref 11.5–15.5)
WBC: 8.4 10*3/uL (ref 4.0–10.5)
nRBC: 0 % (ref 0.0–0.2)

## 2022-12-21 LAB — BASIC METABOLIC PANEL
Anion gap: 5 (ref 5–15)
BUN: 40 mg/dL — ABNORMAL HIGH (ref 8–23)
CO2: 34 mmol/L — ABNORMAL HIGH (ref 22–32)
Calcium: 8.1 mg/dL — ABNORMAL LOW (ref 8.9–10.3)
Chloride: 100 mmol/L (ref 98–111)
Creatinine, Ser: 1.62 mg/dL — ABNORMAL HIGH (ref 0.44–1.00)
GFR, Estimated: 33 mL/min — ABNORMAL LOW (ref >60)
Glucose, Bld: 159 mg/dL — ABNORMAL HIGH (ref 70–99)
Potassium: 4.3 mmol/L (ref 3.5–5.1)
Sodium: 139 mmol/L (ref 135–145)

## 2022-12-21 LAB — MAGNESIUM: Magnesium: 2.3 mg/dL (ref 1.7–2.4)

## 2022-12-21 LAB — PHOSPHORUS: Phosphorus: 3.8 mg/dL (ref 2.5–4.6)

## 2022-12-21 MED ORDER — CARVEDILOL 3.125 MG PO TABS
3.1250 mg | ORAL_TABLET | Freq: Two times a day (BID) | ORAL | Status: DC
  Administered 2022-12-22: 3.125 mg via ORAL
  Filled 2022-12-21 (×2): qty 1

## 2022-12-21 MED ORDER — DICLOFENAC SODIUM 1 % EX GEL
2.0000 g | Freq: Four times a day (QID) | CUTANEOUS | Status: DC
  Administered 2022-12-21 – 2022-12-26 (×13): 2 g via TOPICAL
  Filled 2022-12-21: qty 100

## 2022-12-21 MED ORDER — GUAIFENESIN-DM 100-10 MG/5ML PO SYRP
5.0000 mL | ORAL_SOLUTION | ORAL | Status: DC | PRN
  Administered 2022-12-21 – 2022-12-22 (×5): 5 mL via ORAL
  Filled 2022-12-21 (×6): qty 5

## 2022-12-21 MED ORDER — FUROSEMIDE 10 MG/ML IJ SOLN
40.0000 mg | Freq: Once | INTRAMUSCULAR | Status: AC
  Administered 2022-12-21: 40 mg via INTRAVENOUS
  Filled 2022-12-21: qty 4

## 2022-12-21 NOTE — Plan of Care (Signed)
  Problem: Education: Goal: Knowledge of disease or condition will improve Outcome: Progressing Goal: Knowledge of the prescribed therapeutic regimen will improve Outcome: Progressing   Problem: Activity: Goal: Ability to tolerate increased activity will improve Outcome: Progressing Goal: Will verbalize the importance of balancing activity with adequate rest periods Outcome: Progressing   Problem: Respiratory: Goal: Levels of oxygenation will improve Outcome: Progressing Goal: Ability to maintain adequate ventilation will improve Outcome: Progressing   Problem: Education: Goal: Knowledge of General Education information will improve Description: Including pain rating scale, medication(s)/side effects and non-pharmacologic comfort measures Outcome: Progressing   Problem: Health Behavior/Discharge Planning: Goal: Ability to manage health-related needs will improve Outcome: Progressing   Problem: Clinical Measurements: Goal: Ability to maintain clinical measurements within normal limits will improve Outcome: Progressing Goal: Diagnostic test results will improve Outcome: Progressing Goal: Respiratory complications will improve Outcome: Progressing Goal: Cardiovascular complication will be avoided Outcome: Progressing   Problem: Activity: Goal: Risk for activity intolerance will decrease Outcome: Progressing   Problem: Coping: Goal: Level of anxiety will decrease Outcome: Progressing

## 2022-12-21 NOTE — Progress Notes (Addendum)
PROGRESS NOTE    Sherry Fitzpatrick  [id number] DOB: 08-23-1951 DOA: 12/15/2022 PCP: Glenis Smoker, MD  Chief Complaint  Patient presents with   Shortness of Breath    Brief Narrative:   71/F w/cognitive impairment, diabetes, diastolic CHF, stroke, Sherry Fitzpatrick-fib, OSA, GERD, anemia, obesity, hyperlipidemia, CKD 3, asthma, anxiety, depression presenting with shortness of breath x few days. EMS was called noted to be saturating in the 80s on room air.  Some improvement following Solu-Medrol, DuoNeb, albuterol and route. In ED,creatinine stable 1.16, Troponin negative x 2. D-dimer elevated 1.31.  Respiratory panel for flu COVID RSV negative.  Chest x-ray showing findings favoring pulmonary edema.  VQ scan negative.  Patient received Lasix and DuoNeb in the ED   Assessment & Plan:   Principal Problem:   COPD exacerbation ([place]) Active Problems:   OSA (obstructive sleep apnea)   Hyperlipidemia   Essential hypertension   Asthma   Morbid obesity (HCC)   CKD (chronic kidney disease), stage III (HCC)   COPD (chronic obstructive pulmonary disease) (HCC)   Normocytic anemia   History of CVA (cerebrovascular accident)   Chronic systolic congestive heart failure (HCC)   Status post coronary artery stent placement   PAF (paroxysmal atrial fibrillation) ([place])   Type 2 diabetes mellitus with hyperglycemia, with long-term current use of insulin (HCC)   Anxiety and depression   Esophageal reflux   Acute respiratory failure with hypoxia (HCC)  Acute respiratory failure with hypoxia COPD/asthma exacerbation -primarily COPD exacerbation, improving on with steroids nebs etc. -Flu, COVID PCR, respiratory virus panel is negative, continue Symbicort and PRN albuterol -completed steroids -on 1 L [place]   Chronic diastolic CHF > Last echo in the system was in 2021 with EF 60-65%, indeterminate diastolic function. -2D echo with preserved EF, grade 3 diastolic dysfunction, restrictive cardiomyopathy -CXR  with increase in hazy opacities at R lung base, mild pulm edema -hold ARB, continue lasix IV daily and follow  -strict I/O, daily weights   Chest Pain Noted today after walking  Negative troponin x2, EKG with T wave inversion in inferior leads CXR With increasing vascular congestion and trace edema With negative trop, ACS r/o. Treating COPD and HF below  AKI -Likely secondary to diuresis, ARB use -baseline Creatinine 1.16 -creatinine finally improving, ARB on hold -continue lasix -Renal without hydronephrosis  Paroxysmal Sherry Fitzpatrick-fib - Continue home amiodarone, carvedilol (reduce dose due to brady) -In sinus rhythm now, not on anticoagulation at baseline, likely secondary to DAPT therapy   CAD > With PCI/stenting in 2021. - Continue aspirin, Plavix - Continue atorvastatin, carvedilol, Imdur   Mild  cognitive impairment -Apparently started on Namenda recently, patient declines using this   GERD - Continue home PPI   Anemia -Hemoglobin is stable, monitor   Hyperlipidemia - Continue home atorvastatin   Obesity - Noted    DVT prophylaxis: lovenox Code Status: full Family Communication: none Disposition:   Status is: Inpatient Remains inpatient appropriate because: continued inpatient w/u pending   Consultants:  none  Procedures:  Echo IMPRESSIONS     1. Left ventricular ejection fraction, by estimation, is 65 to 70%. The  left ventricle has normal function. The left ventricle has no regional  wall motion abnormalities. There is mild left ventricular hypertrophy.  Left ventricular diastolic parameters  are consistent with Grade III diastolic dysfunction (restrictive).  Elevated left atrial pressure.   2. Right ventricular systolic function is normal. The right ventricular  size is normal. There is mildly elevated pulmonary artery  systolic  pressure.   3. The mitral valve is normal in structure. Trivial mitral valve  regurgitation.   4. The aortic valve was  not well visualized. Aortic valve regurgitation  is trivial. Moderate aortic valve stenosis. Vmax 3.1 m/s, MG 22 mmHg, AVA  1.5 cm^2, DI 0.46    Antimicrobials:  Anti-infectives (From admission, onward)    None       Subjective: No new complaints  Objective: Vitals:   12/21/22 0843 12/21/22 0910 12/21/22 0915 12/21/22 1115  BP:  (!) 148/59  (!) 168/69  Pulse: (!) 59   (!) 56  Resp: 18   18  Temp:    98.2 F (36.8 C)  TempSrc:    Oral  SpO2: 100%  100% 100%  Weight:      Height:        Intake/Output Summary (Last 24 hours) at 12/21/2022 1537 Last data filed at 12/21/2022 1300 Gross per 24 hour  Intake 240 ml  Output 1650 ml  Net -1410 ml   Filed Weights   12/19/22 0821 12/20/22 0259 12/21/22 0101  Weight: 121 kg 122.2 kg 122.4 kg    Examination:  General: No acute distress. Cardiovascular: Heart sounds show Angello Chien regular rate, and rhythm.  Lungs: diminished Abdomen: Soft, nontender, nondistended  Neurological: Alert and oriented 3. Moves all extremities 4 with equal strength. Cranial nerves II through XII grossly intact. Extremities: No clubbing or cyanosis. No edema.  Data Reviewed: I have personally reviewed following labs and imaging studies  CBC: Recent Labs  Lab 12/15/22 1141 12/16/22 0720 12/19/22 0111 12/21/22 0932  WBC 7.0 12.0* 10.1 8.4  NEUTROABS 4.2  --   --   --   HGB 10.7* 10.7* 10.9* 10.4*  HCT 34.5* 33.1* 35.0* 32.7*  MCV 94.5 92.7 96.2 94.8  PLT 297 281 283 [id number]    Basic Metabolic Panel: Recent Labs  Lab 12/18/22 0049 12/18/22 1240 12/19/22 0111 12/20/22 0105 12/21/22 0932  NA 137 139 140 138 139  K 4.4 4.8 4.4 4.7 4.3  CL 103 100 103 101 100  CO2 '27 30 31 29 '$ 34*  GLUCOSE 187* 130* 150* 188* 159*  BUN 49* 51* 51* 43* 40*  CREATININE 2.16* 2.06* 1.92* 1.66* 1.62*  CALCIUM 8.1* 8.3* 8.1* 8.3* 8.1*  MG  --   --   --   --  2.3  PHOS  --   --   --   --  3.8    GFR: Estimated Creatinine Clearance: 39.9 mL/min (Taqwa Deem) (by C-G  formula based on SCr of 1.62 mg/dL (H)).  Liver Function Tests: Recent Labs  Lab 12/15/22 1141 12/16/22 0720  AST 24 17  ALT 30 28  ALKPHOS 56 60  BILITOT 0.9 0.4  PROT 7.6 7.3  ALBUMIN 3.2* 3.1*    CBG: No results for input(s): "GLUCAP" in the last 168 hours.   Recent Results (from the past 240 hour(s))  Resp panel by RT-PCR (RSV, Flu Shan Valdes&B, Covid) Anterior Nasal Swab     Status: None   Collection Time: 12/15/22 11:45 AM   Specimen: Anterior Nasal Swab  Result Value Ref Range Status   SARS Coronavirus 2 by RT PCR NEGATIVE NEGATIVE Final   Influenza Chyla Schlender by PCR NEGATIVE NEGATIVE Final   Influenza B by PCR NEGATIVE NEGATIVE Final    Comment: (NOTE) The Xpert Xpress SARS-CoV-2/FLU/RSV plus assay is intended as an aid in the diagnosis of influenza from Nasopharyngeal swab specimens and should not be used as Bertrum Helmstetter sole  basis for treatment. Nasal washings and aspirates are unacceptable for Xpert Xpress SARS-CoV-2/FLU/RSV testing.  Fact Sheet for Patients: EntrepreneurPulse.com.au  Fact Sheet for Healthcare Providers: IncredibleEmployment.be  This test is not yet approved or cleared by the [place] FDA and has been authorized for detection and/or diagnosis of SARS-CoV-2 by FDA under an Emergency Use Authorization (EUA). This EUA will remain in effect (meaning this test can be used) for the duration of the COVID-19 declaration under Section 564(b)(1) of the Act, 21 U.S.C. section 360bbb-3(b)(1), unless the authorization is terminated or revoked.     Resp Syncytial Virus by PCR NEGATIVE NEGATIVE Final    Comment: (NOTE) Fact Sheet for Patients: EntrepreneurPulse.com.au  Fact Sheet for Healthcare Providers: IncredibleEmployment.be  This test is not yet approved or cleared by the [place] FDA and has been authorized for detection and/or diagnosis of SARS-CoV-2 by FDA under an Emergency Use  Authorization (EUA). This EUA will remain in effect (meaning this test can be used) for the duration of the COVID-19 declaration under Section 564(b)(1) of the Act, 21 U.S.C. section 360bbb-3(b)(1), unless the authorization is terminated or revoked.  Performed at [facility], [place] [address], [place], [place] [address]   Respiratory (~20 pathogens) panel by PCR     Status: None   Collection Time: 12/15/22 11:45 AM   Specimen: Nasopharyngeal Swab; Respiratory  Result Value Ref Range Status   Adenovirus NOT DETECTED NOT DETECTED Final   Coronavirus 229E NOT DETECTED NOT DETECTED Final    Comment: (NOTE) The Coronavirus on the Respiratory Panel, DOES NOT test for the novel  Coronavirus (2019 nCoV)    Coronavirus HKU1 NOT DETECTED NOT DETECTED Final   Coronavirus NL63 NOT DETECTED NOT DETECTED Final   Coronavirus OC43 NOT DETECTED NOT DETECTED Final   Metapneumovirus NOT DETECTED NOT DETECTED Final   Rhinovirus / Enterovirus NOT DETECTED NOT DETECTED Final   Influenza Danylle Ouk NOT DETECTED NOT DETECTED Final   Influenza B NOT DETECTED NOT DETECTED Final   Parainfluenza Virus 1 NOT DETECTED NOT DETECTED Final   Parainfluenza Virus 2 NOT DETECTED NOT DETECTED Final   Parainfluenza Virus 3 NOT DETECTED NOT DETECTED Final   Parainfluenza Virus 4 NOT DETECTED NOT DETECTED Final   Respiratory Syncytial Virus NOT DETECTED NOT DETECTED Final   Bordetella pertussis NOT DETECTED NOT DETECTED Final   Bordetella Parapertussis NOT DETECTED NOT DETECTED Final   Chlamydophila pneumoniae NOT DETECTED NOT DETECTED Final   Mycoplasma pneumoniae NOT DETECTED NOT DETECTED Final    Comment: Performed at [facility], [place]. [address] [place], [place] [address]         Radiology Studies: DG CHEST PORT 1 VIEW  Result Date: 12/21/2022 CLINICAL DATA:  Shortness of breath EXAM: PORTABLE CHEST 1 VIEW COMPARISON:  CXR 12/20/22 FINDINGS: No pleural effusion. Pneumothorax. Cardiomegaly. Compared to prior  exam there is interval increase in hazy airspace opacities at the right lung base. There is persistent prominence of bilateral interstitial opacities, likely suggestive of mild pulmonary edema. Visualized upper abdomen is unremarkable. No radiographically apparent displaced rib fractures IMPRESSION: 1. Interval increase in hazy airspace opacities at the right lung base, which could represent atelectasis or developing pneumonia. 2. Cardiomegaly with mild pulmonary edema. Electronically Signed   By: Marin Roberts M.D.   On: 12/21/2022 08:22   DG CHEST PORT 1 VIEW  Result Date: 12/20/2022 CLINICAL DATA:  Shortness of breath EXAM: PORTABLE CHEST 1 VIEW COMPARISON:  X-ray 12/15/2022 FINDINGS: Enlarged cardiopericardial silhouette with vascular  congestion and trace edema. No pneumothorax or effusion. No consolidation. The left inferior costophrenic angle is clipped off the edge of the film. Calcified aorta. Overlapping cardiac leads. Film is under penetrated. Degenerative changes. IMPRESSION: Enlarged cardiopericardial silhouette with increasing vascular congestion and trace edema Electronically Signed   By: Jill Side M.D.   On: 12/20/2022 11:49        Scheduled Meds:  amiodarone  100 mg Oral Daily   amLODipine  5 mg Oral Daily   aspirin  81 mg Oral Daily   atorvastatin  80 mg Oral Daily   carvedilol  6.25 mg Oral BID WC   clopidogrel  75 mg Oral Daily   enoxaparin (LOVENOX) injection  40 mg Subcutaneous Q24H   ipratropium-albuterol  3 mL Nebulization BID   isosorbide mononitrate  60 mg Oral Daily   mometasone-formoterol  2 puff Inhalation BID   pantoprazole  40 mg Oral QHS   sodium chloride flush  3 mL Intravenous Q12H   Continuous Infusions:   LOS: 4 days    Time spent: over 30 min    Fayrene Helper, MD [facility]   To contact the attending provider between 7A-7P or the covering provider during after hours 7P-7A, please log into the web site [email or web address] and access using  universal [place] password for that web site. If you do not have the password, please call the hospital operator.  12/21/2022, 3:37 PM

## 2022-12-22 DIAGNOSIS — J441 Chronic obstructive pulmonary disease with (acute) exacerbation: Secondary | ICD-10-CM | POA: Diagnosis not present

## 2022-12-22 LAB — COMPREHENSIVE METABOLIC PANEL
ALT: 44 U/L (ref 0–44)
AST: 19 U/L (ref 15–41)
Albumin: 3.1 g/dL — ABNORMAL LOW (ref 3.5–5.0)
Alkaline Phosphatase: 44 U/L (ref 38–126)
Anion gap: 9 (ref 5–15)
BUN: 37 mg/dL — ABNORMAL HIGH (ref 8–23)
CO2: 33 mmol/L — ABNORMAL HIGH (ref 22–32)
Calcium: 8.4 mg/dL — ABNORMAL LOW (ref 8.9–10.3)
Chloride: 97 mmol/L — ABNORMAL LOW (ref 98–111)
Creatinine, Ser: 1.44 mg/dL — ABNORMAL HIGH (ref 0.44–1.00)
GFR, Estimated: 38 mL/min — ABNORMAL LOW (ref >60)
Glucose, Bld: 181 mg/dL — ABNORMAL HIGH (ref 70–99)
Potassium: 4.2 mmol/L (ref 3.5–5.1)
Sodium: 139 mmol/L (ref 135–145)
Total Bilirubin: 0.7 mg/dL (ref 0.3–1.2)
Total Protein: 6.8 g/dL (ref 6.5–8.1)

## 2022-12-22 LAB — PHOSPHORUS: Phosphorus: 3.6 mg/dL (ref 2.5–4.6)

## 2022-12-22 LAB — CBC WITH DIFFERENTIAL/PLATELET
Abs Immature Granulocytes: 0.06 10*3/uL (ref 0.00–0.07)
Basophils Absolute: 0 10*3/uL (ref 0.0–0.1)
Basophils Relative: 1 %
Eosinophils Absolute: 0.3 10*3/uL (ref 0.0–0.5)
Eosinophils Relative: 4 %
HCT: 33 % — ABNORMAL LOW (ref 36.0–46.0)
Hemoglobin: 10.7 g/dL — ABNORMAL LOW (ref 12.0–15.0)
Immature Granulocytes: 1 %
Lymphocytes Relative: 28 %
Lymphs Abs: 2.3 10*3/uL (ref 0.7–4.0)
MCH: 30.5 pg (ref 26.0–34.0)
MCHC: 32.4 g/dL (ref 30.0–36.0)
MCV: 94 fL (ref 80.0–100.0)
Monocytes Absolute: 0.9 10*3/uL (ref 0.1–1.0)
Monocytes Relative: 10 %
Neutro Abs: 4.8 10*3/uL (ref 1.7–7.7)
Neutrophils Relative %: 56 %
Platelets: 274 10*3/uL (ref 150–400)
RBC: 3.51 MIL/uL — ABNORMAL LOW (ref 3.87–5.11)
RDW: 15.9 % — ABNORMAL HIGH (ref 11.5–15.5)
WBC: 8.4 10*3/uL (ref 4.0–10.5)
nRBC: 0 % (ref 0.0–0.2)

## 2022-12-22 LAB — MAGNESIUM: Magnesium: 2.2 mg/dL (ref 1.7–2.4)

## 2022-12-22 MED ORDER — ALUM & MAG HYDROXIDE-SIMETH 200-200-20 MG/5ML PO SUSP
30.0000 mL | ORAL | Status: DC | PRN
  Administered 2022-12-22 – 2022-12-24 (×3): 30 mL via ORAL
  Filled 2022-12-22 (×3): qty 30

## 2022-12-22 MED ORDER — FUROSEMIDE 10 MG/ML IJ SOLN
40.0000 mg | Freq: Once | INTRAMUSCULAR | Status: AC
  Administered 2022-12-22: 40 mg via INTRAVENOUS
  Filled 2022-12-22: qty 4

## 2022-12-22 MED ORDER — ORAL CARE MOUTH RINSE: 15.0000 mL | OROMUCOSAL | Status: DC | PRN

## 2022-12-22 MED ORDER — FUROSEMIDE 10 MG/ML IJ SOLN
40.0000 mg | Freq: Every day | INTRAMUSCULAR | Status: DC
  Administered 2022-12-22 – 2022-12-24 (×3): 40 mg via INTRAVENOUS
  Filled 2022-12-22 (×3): qty 4

## 2022-12-22 NOTE — Progress Notes (Signed)
PROGRESS NOTE    Sherry Fitzpatrick  [id number] DOB: Jun 13, 1951 DOA: 12/15/2022 PCP: Glenis Smoker, MD  Chief Complaint  Patient presents with   Shortness of Breath    Brief Narrative:   71/F w/cognitive impairment, diabetes, diastolic CHF, stroke, Sherry Fitzpatrick, OSA, GERD, anemia, obesity, hyperlipidemia, CKD 3, asthma, anxiety, depression presenting with shortness of breath x few days. EMS was called noted to be saturating in the 80s on room air.  Some improvement following Solu-Medrol, DuoNeb, albuterol and route. In ED,creatinine stable 1.16, Troponin negative x 2. D-dimer elevated 1.31.  Respiratory panel for flu COVID RSV negative.  Chest x-ray showing findings favoring pulmonary edema.  VQ scan negative.  Patient received Lasix and DuoNeb in the ED   Assessment & Plan:   Principal Problem:   COPD exacerbation ([place]) Active Problems:   OSA (obstructive sleep apnea)   Hyperlipidemia   Essential hypertension   Asthma   Morbid obesity (HCC)   CKD (chronic kidney disease), stage III (HCC)   COPD (chronic obstructive pulmonary disease) (HCC)   Normocytic anemia   History of CVA (cerebrovascular accident)   Chronic systolic congestive heart failure (HCC)   Status post coronary artery stent placement   PAF (paroxysmal atrial fibrillation) ([place])   Type 2 diabetes mellitus with hyperglycemia, with long-term current use of insulin (HCC)   Anxiety and depression   Esophageal reflux   Acute respiratory failure with hypoxia (HCC)  Acute respiratory failure with hypoxia COPD/asthma exacerbation -primarily COPD exacerbation, improving on with steroids nebs etc. -Flu, COVID PCR, respiratory virus panel is negative, continue Symbicort and PRN albuterol -completed steroids - no wheezing -on 1 L    Chronic diastolic CHF > Last echo in the system was in 2021 with EF 60-65%, indeterminate diastolic function. -2D echo with preserved EF, grade 3 diastolic dysfunction, restrictive  cardiomyopathy -CXR with increase in hazy opacities at R lung base, mild pulm edema -hold ARB, continue lasix IV daily and follow - remains overloaded -strict I/O, daily weights   Chest Pain resolved Negative troponin x2, EKG with T wave inversion in inferior leads CXR With increasing vascular congestion and trace edema With negative trop, ACS r/o. Treating COPD and HF   AKI -Likely secondary to diuresis, ARB use -baseline Creatinine 1.16 -creatinine improving, ARB on hold -continue lasix -Renal without hydronephrosis  Paroxysmal Sherry Fitzpatrick - Continue home amiodarone Hold coreg due to bradycardia (mild), will follow  -In sinus rhythm now, not on anticoagulation at baseline, likely secondary to DAPT therapy   CAD > With PCI/stenting in 2021. - Continue aspirin, Plavix - Continue atorvastatin, carvedilol, Imdur   Mild  cognitive impairment -Apparently started on Namenda recently, patient declines using this   GERD - Continue home PPI   Anemia -Hemoglobin is stable, monitor   Hyperlipidemia - Continue home atorvastatin   Obesity - Noted Body mass index is 46.38 kg/m.    DVT prophylaxis: lovenox Code Status: full Family Communication: daughter at bedside Disposition:   Status is: Inpatient Remains inpatient appropriate because: continued inpatient w/u pending   Consultants:  none  Procedures:  Echo IMPRESSIONS     1. Left ventricular ejection fraction, by estimation, is 65 to 70%. The  left ventricle has normal function. The left ventricle has no regional  wall motion abnormalities. There is mild left ventricular hypertrophy.  Left ventricular diastolic parameters  are consistent with Grade III diastolic dysfunction (restrictive).  Elevated left atrial pressure.   2. Right ventricular systolic function is normal. The right  ventricular  size is normal. There is mildly elevated pulmonary artery systolic  pressure.   3. The mitral valve is normal in  structure. Trivial mitral valve  regurgitation.   4. The aortic valve was not well visualized. Aortic valve regurgitation  is trivial. Moderate aortic valve stenosis. Vmax 3.1 m/s, MG 22 mmHg, AVA  1.5 cm^2, DI 0.46    Antimicrobials:  Anti-infectives (From admission, onward)    None       Subjective: Still has SOB  Objective: Vitals:   12/22/22 0924 12/22/22 1018 12/22/22 1136 12/22/22 1600  BP: (!) 181/65 (!) 165/57 (!) 122/47 (!) 155/50  Pulse:   (!) 58 (!) 56  Resp:    18  Temp:   98.2 F (36.8 C) 98.2 F (36.8 C)  TempSrc:   Oral Oral  SpO2:   97% 100%  Weight:      Height:        Intake/Output Summary (Last 24 hours) at 12/22/2022 1652 Last data filed at 12/22/2022 1446 Gross per 24 hour  Intake 360 ml  Output 2550 ml  Net -2190 ml   Filed Weights   12/20/22 0259 12/21/22 0101 12/22/22 0215  Weight: 122.2 kg 122.4 kg 122.6 kg    Examination:  General: No acute distress. Cardiovascular: RRR Lungs: Clear to auscultation bilaterally  Abdomen: Soft, nontender, nondistended  Neurological: Alert and oriented 3. Moves all extremities 4 with equal strength. Cranial nerves II through XII grossly intact. Extremities: bilateral 1+ edema   Data Reviewed: I have personally reviewed following labs and imaging studies  CBC: Recent Labs  Lab 12/16/22 0720 12/19/22 0111 12/21/22 0932 12/22/22 0610  WBC 12.0* 10.1 8.4 8.4  NEUTROABS  --   --   --  4.8  HGB 10.7* 10.9* 10.4* 10.7*  HCT 33.1* 35.0* 32.7* 33.0*  MCV 92.7 96.2 94.8 94.0  PLT 281 283 262 [id number]    Basic Metabolic Panel: Recent Labs  Lab 12/18/22 1240 12/19/22 0111 12/20/22 0105 12/21/22 0932 12/22/22 0610  NA 139 140 138 139 139  K 4.8 4.4 4.7 4.3 4.2  CL 100 103 101 100 97*  CO2 '30 31 29 '$ 34* 33*  GLUCOSE 130* 150* 188* 159* 181*  BUN 51* 51* 43* 40* 37*  CREATININE 2.06* 1.92* 1.66* 1.62* 1.44*  CALCIUM 8.3* 8.1* 8.3* 8.1* 8.4*  MG  --   --   --  2.3 2.2  PHOS  --   --   --  3.8 3.6     GFR: Estimated Creatinine Clearance: 45 mL/min (Sherry Fitzpatrick) (by C-G formula based on SCr of 1.44 mg/dL (H)).  Liver Function Tests: Recent Labs  Lab 12/16/22 0720 12/22/22 0610  AST 17 19  ALT 28 44  ALKPHOS 60 44  BILITOT 0.4 0.7  PROT 7.3 6.8  ALBUMIN 3.1* 3.1*    CBG: No results for input(s): "GLUCAP" in the last 168 hours.   Recent Results (from the past 240 hour(s))  Resp panel by RT-PCR (RSV, Flu Mikela Senn&B, Covid) Anterior Nasal Swab     Status: None   Collection Time: 12/15/22 11:45 AM   Specimen: Anterior Nasal Swab  Result Value Ref Range Status   SARS Coronavirus 2 by RT PCR NEGATIVE NEGATIVE Final   Influenza Jontae Adebayo by PCR NEGATIVE NEGATIVE Final   Influenza B by PCR NEGATIVE NEGATIVE Final    Comment: (NOTE) The Xpert Xpress SARS-CoV-2/FLU/RSV plus assay is intended as an aid in the diagnosis of influenza from Nasopharyngeal swab specimens and should  not be used as Ajamu Maxon sole basis for treatment. Nasal washings and aspirates are unacceptable for Xpert Xpress SARS-CoV-2/FLU/RSV testing.  Fact Sheet for Patients: EntrepreneurPulse.com.au  Fact Sheet for Healthcare Providers: IncredibleEmployment.be  This test is not yet approved or cleared by the [place] FDA and has been authorized for detection and/or diagnosis of SARS-CoV-2 by FDA under an Emergency Use Authorization (EUA). This EUA will remain in effect (meaning this test can be used) for the duration of the COVID-19 declaration under Section 564(b)(1) of the Act, 21 U.S.C. section 360bbb-3(b)(1), unless the authorization is terminated or revoked.     Resp Syncytial Virus by PCR NEGATIVE NEGATIVE Final    Comment: (NOTE) Fact Sheet for Patients: EntrepreneurPulse.com.au  Fact Sheet for Healthcare Providers: IncredibleEmployment.be  This test is not yet approved or cleared by the [place] FDA and has been authorized for detection  and/or diagnosis of SARS-CoV-2 by FDA under an Emergency Use Authorization (EUA). This EUA will remain in effect (meaning this test can be used) for the duration of the COVID-19 declaration under Section 564(b)(1) of the Act, 21 U.S.C. section 360bbb-3(b)(1), unless the authorization is terminated or revoked.  Performed at [facility], [place] [address], [place], [place] [address]   Respiratory (~20 pathogens) panel by PCR     Status: None   Collection Time: 12/15/22 11:45 AM   Specimen: Nasopharyngeal Swab; Respiratory  Result Value Ref Range Status   Adenovirus NOT DETECTED NOT DETECTED Final   Coronavirus 229E NOT DETECTED NOT DETECTED Final    Comment: (NOTE) The Coronavirus on the Respiratory Panel, DOES NOT test for the novel  Coronavirus (2019 nCoV)    Coronavirus HKU1 NOT DETECTED NOT DETECTED Final   Coronavirus NL63 NOT DETECTED NOT DETECTED Final   Coronavirus OC43 NOT DETECTED NOT DETECTED Final   Metapneumovirus NOT DETECTED NOT DETECTED Final   Rhinovirus / Enterovirus NOT DETECTED NOT DETECTED Final   Influenza Kursten Kruk NOT DETECTED NOT DETECTED Final   Influenza B NOT DETECTED NOT DETECTED Final   Parainfluenza Virus 1 NOT DETECTED NOT DETECTED Final   Parainfluenza Virus 2 NOT DETECTED NOT DETECTED Final   Parainfluenza Virus 3 NOT DETECTED NOT DETECTED Final   Parainfluenza Virus 4 NOT DETECTED NOT DETECTED Final   Respiratory Syncytial Virus NOT DETECTED NOT DETECTED Final   Bordetella pertussis NOT DETECTED NOT DETECTED Final   Bordetella Parapertussis NOT DETECTED NOT DETECTED Final   Chlamydophila pneumoniae NOT DETECTED NOT DETECTED Final   Mycoplasma pneumoniae NOT DETECTED NOT DETECTED Final    Comment: Performed at [facility], [place]. [address], [place], [place] [address]         Radiology Studies: DG CHEST PORT 1 VIEW  Result Date: 12/21/2022 CLINICAL DATA:  Shortness of breath EXAM: PORTABLE CHEST 1 VIEW COMPARISON:  CXR 12/20/22 FINDINGS: No  pleural effusion. Pneumothorax. Cardiomegaly. Compared to prior exam there is interval increase in hazy airspace opacities at the right lung base. There is persistent prominence of bilateral interstitial opacities, likely suggestive of mild pulmonary edema. Visualized upper abdomen is unremarkable. No radiographically apparent displaced rib fractures IMPRESSION: 1. Interval increase in hazy airspace opacities at the right lung base, which could represent atelectasis or developing pneumonia. 2. Cardiomegaly with mild pulmonary edema. Electronically Signed   By: Marin Roberts M.D.   On: 12/21/2022 08:22        Scheduled Meds:  amiodarone  100 mg Oral Daily   amLODipine  5 mg Oral Daily  aspirin  81 mg Oral Daily   atorvastatin  80 mg Oral Daily   clopidogrel  75 mg Oral Daily   diclofenac Sodium  2 g Topical QID   enoxaparin (LOVENOX) injection  40 mg Subcutaneous Q24H   furosemide  40 mg Intravenous Daily   ipratropium-albuterol  3 mL Nebulization BID   isosorbide mononitrate  60 mg Oral Daily   mometasone-formoterol  2 puff Inhalation BID   pantoprazole  40 mg Oral QHS   sodium chloride flush  3 mL Intravenous Q12H   Continuous Infusions:   LOS: 5 days    Time spent: over 30 min    Fayrene Helper, MD [facility]   To contact the attending provider between 7A-7P or the covering provider during after hours 7P-7A, please log into the web site [email or web address] and access using universal [place] password for that web site. If you do not have the password, please call the hospital operator.  12/22/2022, 4:52 PM

## 2022-12-22 NOTE — Plan of Care (Signed)
  Problem: Education: Goal: Knowledge of disease or condition will improve Outcome: Progressing Goal: Knowledge of the prescribed therapeutic regimen will improve Outcome: Progressing   Problem: Activity: Goal: Ability to tolerate increased activity will improve Outcome: Progressing Goal: Will verbalize the importance of balancing activity with adequate rest periods Outcome: Progressing   Problem: Respiratory: Goal: Ability to maintain a clear airway will improve Outcome: Progressing Goal: Levels of oxygenation will improve Outcome: Progressing Goal: Ability to maintain adequate ventilation will improve Outcome: Progressing   Problem: Education: Goal: Knowledge of General Education information will improve Description: Including pain rating scale, medication(s)/side effects and non-pharmacologic comfort measures Outcome: Progressing   Problem: Health Behavior/Discharge Planning: Goal: Ability to manage health-related needs will improve Outcome: Progressing   Problem: Clinical Measurements: Goal: Ability to maintain clinical measurements within normal limits will improve Outcome: Progressing Goal: Diagnostic test results will improve Outcome: Progressing Goal: Respiratory complications will improve Outcome: Progressing Goal: Cardiovascular complication will be avoided Outcome: Progressing   Problem: Activity: Goal: Risk for activity intolerance will decrease Outcome: Progressing   Problem: Coping: Goal: Level of anxiety will decrease Outcome: Progressing   Problem: Elimination: Goal: Will not experience complications related to urinary retention Outcome: Progressing   Problem: Safety: Goal: Ability to remain free from injury will improve Outcome: Progressing

## 2022-12-23 DIAGNOSIS — J441 Chronic obstructive pulmonary disease with (acute) exacerbation: Secondary | ICD-10-CM | POA: Diagnosis not present

## 2022-12-23 LAB — COMPREHENSIVE METABOLIC PANEL
ALT: 34 U/L (ref 0–44)
AST: 16 U/L (ref 15–41)
Albumin: 2.9 g/dL — ABNORMAL LOW (ref 3.5–5.0)
Alkaline Phosphatase: 46 U/L (ref 38–126)
Anion gap: 9 (ref 5–15)
BUN: 28 mg/dL — ABNORMAL HIGH (ref 8–23)
CO2: 38 mmol/L — ABNORMAL HIGH (ref 22–32)
Calcium: 8.6 mg/dL — ABNORMAL LOW (ref 8.9–10.3)
Chloride: 95 mmol/L — ABNORMAL LOW (ref 98–111)
Creatinine, Ser: 1.34 mg/dL — ABNORMAL HIGH (ref 0.44–1.00)
GFR, Estimated: 42 mL/min — ABNORMAL LOW (ref >60)
Glucose, Bld: 126 mg/dL — ABNORMAL HIGH (ref 70–99)
Potassium: 4.7 mmol/L (ref 3.5–5.1)
Sodium: 142 mmol/L (ref 135–145)
Total Bilirubin: 0.7 mg/dL (ref 0.3–1.2)
Total Protein: 6.7 g/dL (ref 6.5–8.1)

## 2022-12-23 LAB — BLOOD GAS, VENOUS
Acid-Base Excess: 17.7 mmol/L — ABNORMAL HIGH (ref 0.0–2.0)
Bicarbonate: 45.8 mmol/L — ABNORMAL HIGH (ref 20.0–28.0)
Drawn by: 4653
O2 Saturation: 96.9 %
Patient temperature: 37
pCO2, Ven: 69 mmHg — ABNORMAL HIGH (ref 44–60)
pH, Ven: 7.43 (ref 7.25–7.43)
pO2, Ven: 70 mmHg — ABNORMAL HIGH (ref 32–45)

## 2022-12-23 LAB — CBC WITH DIFFERENTIAL/PLATELET
Abs Immature Granulocytes: 0.03 10*3/uL (ref 0.00–0.07)
Basophils Absolute: 0 10*3/uL (ref 0.0–0.1)
Basophils Relative: 1 %
Eosinophils Absolute: 0.4 10*3/uL (ref 0.0–0.5)
Eosinophils Relative: 6 %
HCT: 34.1 % — ABNORMAL LOW (ref 36.0–46.0)
Hemoglobin: 10.6 g/dL — ABNORMAL LOW (ref 12.0–15.0)
Immature Granulocytes: 0 %
Lymphocytes Relative: 29 %
Lymphs Abs: 2.3 10*3/uL (ref 0.7–4.0)
MCH: 29.7 pg (ref 26.0–34.0)
MCHC: 31.1 g/dL (ref 30.0–36.0)
MCV: 95.5 fL (ref 80.0–100.0)
Monocytes Absolute: 1 10*3/uL (ref 0.1–1.0)
Monocytes Relative: 12 %
Neutro Abs: 4.1 10*3/uL (ref 1.7–7.7)
Neutrophils Relative %: 52 %
Platelets: 267 10*3/uL (ref 150–400)
RBC: 3.57 MIL/uL — ABNORMAL LOW (ref 3.87–5.11)
RDW: 15.6 % — ABNORMAL HIGH (ref 11.5–15.5)
WBC: 7.9 10*3/uL (ref 4.0–10.5)
nRBC: 0 % (ref 0.0–0.2)

## 2022-12-23 LAB — PHOSPHORUS: Phosphorus: 4.2 mg/dL (ref 2.5–4.6)

## 2022-12-23 LAB — GLUCOSE, CAPILLARY: Glucose-Capillary: 147 mg/dL — ABNORMAL HIGH (ref 70–99)

## 2022-12-23 LAB — MAGNESIUM: Magnesium: 2.2 mg/dL (ref 1.7–2.4)

## 2022-12-23 MED ORDER — FUROSEMIDE 10 MG/ML IJ SOLN
40.0000 mg | Freq: Once | INTRAMUSCULAR | Status: AC
  Administered 2022-12-23: 40 mg via INTRAVENOUS
  Filled 2022-12-23: qty 4

## 2022-12-23 NOTE — Progress Notes (Signed)
PROGRESS NOTE    Sherry Fitzpatrick  [id number] DOB: Mar 14, 1951 DOA: 12/15/2022 PCP: Glenis Smoker, MD  Chief Complaint  Patient presents with   Shortness of Breath    Brief Narrative:   71/F w/cognitive impairment, diabetes, diastolic CHF, stroke, Sherry Fitzpatrick-fib, OSA, GERD, anemia, obesity, hyperlipidemia, CKD 3, asthma, anxiety, depression presenting with shortness of breath x few days. EMS was called noted to be saturating in the 80s on room air.  Some improvement following Solu-Medrol, DuoNeb, albuterol and route. In ED,creatinine stable 1.16, Troponin negative x 2. D-dimer elevated 1.31.  Respiratory panel for flu COVID RSV negative.  Chest x-ray showing findings favoring pulmonary edema.  VQ scan negative.  Patient received Lasix and DuoNeb in the ED   Assessment & Plan:   Principal Problem:   COPD exacerbation ([place]) Active Problems:   OSA (obstructive sleep apnea)   Hyperlipidemia   Essential hypertension   Asthma   Morbid obesity (HCC)   CKD (chronic kidney disease), stage III (HCC)   COPD (chronic obstructive pulmonary disease) (HCC)   Normocytic anemia   History of CVA (cerebrovascular accident)   Chronic systolic congestive heart failure (HCC)   Status post coronary artery stent placement   PAF (paroxysmal atrial fibrillation) (HCC)   Type 2 diabetes mellitus with hyperglycemia, with long-term current use of insulin (HCC)   Anxiety and depression   Esophageal reflux   Acute respiratory failure with hypoxia (HCC)  Acute respiratory failure with hypoxia Related to COPD and HF COPD seems improved Continuing treatment with diuresis  Acute on Chronic diastolic CHF - Last echo in the system was in 2021 with EF 60-65%, indeterminate diastolic function. -2D echo with preserved EF, grade 3 diastolic dysfunction, restrictive cardiomyopathy -CXR with increase in hazy opacities at R lung base, mild pulm edema -hold ARB, continue lasix IV daily and follow - remains overloaded  - I think approaching euvolemia, developing alkalosis -strict I/O, daily weights   COPD/asthma exacerbation -primarily COPD exacerbation, improving on with steroids nebs etc. -Flu, COVID PCR, respiratory virus panel is negative, continue Symbicort and PRN albuterol -completed steroids - no wheezing -on 1 L    Chest Pain resolved Negative troponin x2, EKG with T wave inversion in inferior leads CXR With increasing vascular congestion and trace edema With negative trop, ACS r/o. Treating COPD and HF   AKI -Likely secondary to diuresis, ARB use -baseline Creatinine 1.16 -creatinine improving, ARB on hold -continue lasix -Renal without hydronephrosis  Paroxysmal Sherry Fitzpatrick-fib - Continue home amiodarone Hold coreg due to bradycardia (mild), will follow  -In sinus rhythm now, not on anticoagulation at baseline, likely secondary to DAPT therapy   CAD > With PCI/stenting in 2021. - Continue aspirin, Plavix - Continue atorvastatin, carvedilol, Imdur   Mild  cognitive impairment -Apparently started on Namenda recently, patient declines using this   GERD - Continue home PPI   Anemia -Hemoglobin is stable, monitor   Hyperlipidemia - Continue home atorvastatin   Obesity - Noted Body mass index is 46.58 kg/m.    DVT prophylaxis: lovenox Code Status: full Family Communication: none at bedside Disposition:   Status is: Inpatient Remains inpatient appropriate because: continued inpatient w/u pending   Consultants:  none  Procedures:  Echo IMPRESSIONS     1. Left ventricular ejection fraction, by estimation, is 65 to 70%. The  left ventricle has normal function. The left ventricle has no regional  wall motion abnormalities. There is mild left ventricular hypertrophy.  Left ventricular diastolic parameters  are consistent  with Grade III diastolic dysfunction (restrictive).  Elevated left atrial pressure.   2. Right ventricular systolic function is normal. The right  ventricular  size is normal. There is mildly elevated pulmonary artery systolic  pressure.   3. The mitral valve is normal in structure. Trivial mitral valve  regurgitation.   4. The aortic valve was not well visualized. Aortic valve regurgitation  is trivial. Moderate aortic valve stenosis. Vmax 3.1 m/s, MG 22 mmHg, AVA  1.5 cm^2, DI 0.46    Antimicrobials:  Anti-infectives (From admission, onward)    None       Subjective: Not quite back to baseline  Objective: Vitals:   12/23/22 0920 12/23/22 1004 12/23/22 1305 12/23/22 1609  BP:  (!) 179/55 (!) 137/50   Pulse: 68  66 61  Resp: '18  18 18  '$ Temp:   98.1 F (36.7 C) 98.2 F (36.8 C)  TempSrc:   Oral Oral  SpO2: 96%  95% 92%  Weight:      Height:        Intake/Output Summary (Last 24 hours) at 12/23/2022 1613 Last data filed at 12/23/2022 1308 Gross per 24 hour  Intake 360 ml  Output 3100 ml  Net -2740 ml   Filed Weights   12/21/22 0101 12/22/22 0215 12/23/22 0300  Weight: 122.4 kg 122.6 kg 123.1 kg    Examination:  General: No acute distress. Cardiovascular: RRR Lungs: diminished, unlabored Abdomen: Soft, nontender, nondistended Neurological: Alert and oriented 3. Moves all extremities 4. Cranial nerves II through XII grossly intact. Extremities: bilateral 1+ edema   Data Reviewed: I have personally reviewed following labs and imaging studies  CBC: Recent Labs  Lab 12/19/22 0111 12/21/22 0932 12/22/22 0610 12/23/22 0545  WBC 10.1 8.4 8.4 7.9  NEUTROABS  --   --  4.8 4.1  HGB 10.9* 10.4* 10.7* 10.6*  HCT 35.0* 32.7* 33.0* 34.1*  MCV 96.2 94.8 94.0 95.5  PLT 283 262 274 [id number]    Basic Metabolic Panel: Recent Labs  Lab 12/19/22 0111 12/20/22 0105 12/21/22 0932 12/22/22 0610 12/23/22 0545  NA 140 138 139 139 142  K 4.4 4.7 4.3 4.2 4.7  CL 103 101 100 97* 95*  CO2 31 29 34* 33* 38*  GLUCOSE 150* 188* 159* 181* 126*  BUN 51* 43* 40* 37* 28*  CREATININE 1.92* 1.66* 1.62* 1.44* 1.34*   CALCIUM 8.1* 8.3* 8.1* 8.4* 8.6*  MG  --   --  2.3 2.2 2.2  PHOS  --   --  3.8 3.6 4.2    GFR: Estimated Creatinine Clearance: 48.5 mL/min (Sherry Fitzpatrick) (by C-G formula based on SCr of 1.34 mg/dL (H)).  Liver Function Tests: Recent Labs  Lab 12/22/22 0610 12/23/22 0545  AST 19 16  ALT 44 34  ALKPHOS 44 46  BILITOT 0.7 0.7  PROT 6.8 6.7  ALBUMIN 3.1* 2.9*    CBG: Recent Labs  Lab 12/23/22 0624  GLUCAP 147*     Recent Results (from the past 240 hour(s))  Resp panel by RT-PCR (RSV, Flu Hassell Patras&B, Covid) Anterior Nasal Swab     Status: None   Collection Time: 12/15/22 11:45 AM   Specimen: Anterior Nasal Swab  Result Value Ref Range Status   SARS Coronavirus 2 by RT PCR NEGATIVE NEGATIVE Final   Influenza Lemmie Vanlanen by PCR NEGATIVE NEGATIVE Final   Influenza B by PCR NEGATIVE NEGATIVE Final    Comment: (NOTE) The Xpert Xpress SARS-CoV-2/FLU/RSV plus assay is intended as an aid in the diagnosis  of influenza from Nasopharyngeal swab specimens and should not be used as Teondra Newburg sole basis for treatment. Nasal washings and aspirates are unacceptable for Xpert Xpress SARS-CoV-2/FLU/RSV testing.  Fact Sheet for Patients: EntrepreneurPulse.com.au  Fact Sheet for Healthcare Providers: IncredibleEmployment.be  This test is not yet approved or cleared by the [place] FDA and has been authorized for detection and/or diagnosis of SARS-CoV-2 by FDA under an Emergency Use Authorization (EUA). This EUA will remain in effect (meaning this test can be used) for the duration of the COVID-19 declaration under Section 564(b)(1) of the Act, 21 U.S.C. section 360bbb-3(b)(1), unless the authorization is terminated or revoked.     Resp Syncytial Virus by PCR NEGATIVE NEGATIVE Final    Comment: (NOTE) Fact Sheet for Patients: EntrepreneurPulse.com.au  Fact Sheet for Healthcare Providers: IncredibleEmployment.be  This test is not yet  approved or cleared by the [place] FDA and has been authorized for detection and/or diagnosis of SARS-CoV-2 by FDA under an Emergency Use Authorization (EUA). This EUA will remain in effect (meaning this test can be used) for the duration of the COVID-19 declaration under Section 564(b)(1) of the Act, 21 U.S.C. section 360bbb-3(b)(1), unless the authorization is terminated or revoked.  Performed at [facility], [place] [address], [place], [place] [address]   Respiratory (~20 pathogens) panel by PCR     Status: None   Collection Time: 12/15/22 11:45 AM   Specimen: Nasopharyngeal Swab; Respiratory  Result Value Ref Range Status   Adenovirus NOT DETECTED NOT DETECTED Final   Coronavirus 229E NOT DETECTED NOT DETECTED Final    Comment: (NOTE) The Coronavirus on the Respiratory Panel, DOES NOT test for the novel  Coronavirus (2019 nCoV)    Coronavirus HKU1 NOT DETECTED NOT DETECTED Final   Coronavirus NL63 NOT DETECTED NOT DETECTED Final   Coronavirus OC43 NOT DETECTED NOT DETECTED Final   Metapneumovirus NOT DETECTED NOT DETECTED Final   Rhinovirus / Enterovirus NOT DETECTED NOT DETECTED Final   Influenza Meiya Wisler NOT DETECTED NOT DETECTED Final   Influenza B NOT DETECTED NOT DETECTED Final   Parainfluenza Virus 1 NOT DETECTED NOT DETECTED Final   Parainfluenza Virus 2 NOT DETECTED NOT DETECTED Final   Parainfluenza Virus 3 NOT DETECTED NOT DETECTED Final   Parainfluenza Virus 4 NOT DETECTED NOT DETECTED Final   Respiratory Syncytial Virus NOT DETECTED NOT DETECTED Final   Bordetella pertussis NOT DETECTED NOT DETECTED Final   Bordetella Parapertussis NOT DETECTED NOT DETECTED Final   Chlamydophila pneumoniae NOT DETECTED NOT DETECTED Final   Mycoplasma pneumoniae NOT DETECTED NOT DETECTED Final    Comment: Performed at [facility], [place]. [address], [place], [place] [address]         Radiology Studies: No results found.      Scheduled Meds:  amiodarone  100 mg  Oral Daily   amLODipine  5 mg Oral Daily   aspirin  81 mg Oral Daily   atorvastatin  80 mg Oral Daily   clopidogrel  75 mg Oral Daily   diclofenac Sodium  2 g Topical QID   enoxaparin (LOVENOX) injection  40 mg Subcutaneous Q24H   furosemide  40 mg Intravenous Daily   ipratropium-albuterol  3 mL Nebulization BID   isosorbide mononitrate  60 mg Oral Daily   mometasone-formoterol  2 puff Inhalation BID   pantoprazole  40 mg Oral QHS   sodium chloride flush  3 mL Intravenous Q12H   Continuous Infusions:   LOS: 6 days  Time spent: over 30 min    Fayrene Helper, MD [facility]   To contact the attending provider between 7A-7P or the covering provider during after hours 7P-7A, please log into the web site [email or web address] and access using universal [place] password for that web site. If you do not have the password, please call the hospital operator.  12/23/2022, 4:13 PM

## 2022-12-23 NOTE — Plan of Care (Signed)
  Problem: Education: Goal: Knowledge of disease or condition will improve Outcome: Progressing Goal: Knowledge of the prescribed therapeutic regimen will improve Outcome: Progressing   Problem: Activity: Goal: Ability to tolerate increased activity will improve Outcome: Progressing Goal: Will verbalize the importance of balancing activity with adequate rest periods Outcome: Progressing   Problem: Respiratory: Goal: Ability to maintain a clear airway will improve Outcome: Progressing Goal: Levels of oxygenation will improve Outcome: Progressing Goal: Ability to maintain adequate ventilation will improve Outcome: Progressing   Problem: Health Behavior/Discharge Planning: Goal: Ability to manage health-related needs will improve Outcome: Progressing   Problem: Clinical Measurements: Goal: Ability to maintain clinical measurements within normal limits will improve Outcome: Progressing Goal: Diagnostic test results will improve Outcome: Progressing Goal: Respiratory complications will improve Outcome: Progressing Goal: Cardiovascular complication will be avoided Outcome: Progressing   Problem: Coping: Goal: Level of anxiety will decrease Outcome: Progressing   Problem: Elimination: Goal: Will not experience complications related to urinary retention Outcome: Progressing   Problem: Safety: Goal: Ability to remain free from injury will improve Outcome: Progressing

## 2022-12-23 NOTE — Evaluation (Signed)
Physical Therapy Re-Evaluation  Patient Details Name: Sherry Fitzpatrick MRN: [id number] DOB: 12-08-1950 Today's Date: 12/23/2022  History of Present Illness  Pt is a 72 y.o. female presenting 12/15/22 with shortness of breath. VQ scan negative; resp panel negative; admitted with COPD exacerbation PMH significant for cognitive impairment, diabetes, diastolic CHF, stroke, A-fib, OSA, GERD, anemia, obesity, hyperlipidemia, CKD 3, asthma, anxiety, depression  +pulmonary edema.   Clinical Impression  Pt admitted with above diagnosis. Pt currently with functional limitations due to the deficits listed below (see PT Problem List). At the time of assessment pt was able to perform transfers and ambulation with min guard assist and RW for support/energy conservation. Pt on RA throughout session however sats varying between 84% and 91% during activity and between 87% and 95% at rest. Tolerance for functional activity is significantly decreased compared to initial evaluation on 3/5. Recommend HHPT follow up at d/c to maximize functional return. Acutely, pt will benefit from skilled PT to increase their independence and safety with mobility to allow discharge to the venue listed below.          Recommendations for follow up therapy are one component of a multi-disciplinary discharge planning process, led by the attending physician.  Recommendations may be updated based on patient status, additional functional criteria and insurance authorization.  Follow Up Recommendations Home health PT      Assistance Recommended at Discharge Intermittent Supervision/Assistance  Patient can return home with the following  Assistance with cooking/housework;Direct supervision/assist for medications management;Direct supervision/assist for financial management;Assist for transportation;Help with stairs or ramp for entrance;A little help with walking and/or transfers;A little help with bathing/dressing/bathroom    Equipment  Recommendations None recommended by PT  Recommendations for Other Services       Functional Status Assessment Patient has had a recent decline in their functional status and demonstrates the ability to make significant improvements in function in a reasonable and predictable amount of time.     Precautions / Restrictions Precautions Precautions: Fall Precaution Comments: Check 02 Sats Restrictions Weight Bearing Restrictions: No      Mobility  Bed Mobility Overal bed mobility: Modified Independent Bed Mobility: Supine to Sit     Supine to sit: Min guard, HOB elevated     General bed mobility comments: Increased time and effort. Pt was able to transition to EOB without physical assist, however close guard provided for safety due to posterior lean.    Transfers Overall transfer level: Needs assistance Equipment used: Rolling walker (2 wheels) Transfers: Sit to/from Stand Sit to Stand: Supervision           General transfer comment: Close supervision for safety as pt powered up to full stand. No assist required. No unsteadiness noted.    Ambulation/Gait Ambulation/Gait assistance: Supervision Gait Distance (Feet): 150 Feet Assistive device: Rolling walker (2 wheels) Gait Pattern/deviations: Step-through pattern, Decreased stride length, Wide base of support Gait velocity: Decreased Gait velocity interpretation: 1.31 - 2.62 ft/sec, indicative of limited community ambulator   General Gait Details: Slow and guarded with standing rest breaks due to fatigue. DOE 3/4. Pt on RA throughout ambulation and sats decreased to 84%.  Stairs            Wheelchair Mobility    Modified Rankin (Stroke Patients Only)       Balance Overall balance assessment: Needs assistance Sitting-balance support: Bilateral upper extremity supported, Feet supported Sitting balance-Leahy Scale: Fair Sitting balance - Comments: EOB sitting balance Supervision   Standing balance  support:  During functional activity Standing balance-Leahy Scale: Poor Standing balance comment: Standing balance during functional ambulation SBA                             Pertinent Vitals/Pain Pain Assessment Pain Assessment: No/denies pain    Home Living Family/patient expects to be discharged to:: Private residence Living Arrangements: Children (daughter) Available Help at Discharge: Family;Available PRN/intermittently Type of Home: House Home Access: Level entry (if she goes in the door straight to her room)     Alternate Level Stairs-Number of Steps: 2 Home Layout: Two level Home Equipment: Conservation officer, nature (2 wheels)      Prior Function Prior Level of Function : Independent/Modified Independent             Mobility Comments: rarely uses RW ADLs Comments: Pt reports independent in ADL and that daughter and son in law assist with IADL.     Hand Dominance   Dominant Hand: Left    Extremity/Trunk Assessment   Upper Extremity Assessment Upper Extremity Assessment: Overall WFL for tasks assessed    Lower Extremity Assessment Lower Extremity Assessment: Generalized weakness    Cervical / Trunk Assessment Cervical / Trunk Assessment: Normal  Communication   Communication: No difficulties  Cognition Arousal/Alertness: Awake/alert Behavior During Therapy: WFL for tasks assessed/performed Overall Cognitive Status: Within Functional Limits for tasks assessed                                 General Comments: per chart, cognitive deficits at baseline. Per pt report, she receives assist with medication management and financial management. May occasionally make simple meal independently. Pt with intermittent slowed processing, but [facility] for basic daily tasks. Oriented to day and date.        General Comments      Exercises     Assessment/Plan    PT Assessment Patient needs continued PT services  PT Problem List Decreased  balance;Decreased mobility;Decreased knowledge of use of DME;Cardiopulmonary status limiting activity       PT Treatment Interventions DME instruction;Gait training;Stair training;Functional mobility training;Therapeutic activities;Therapeutic exercise;Balance training;Patient/family education    PT Goals (Current goals can be found in the Care Plan section)  Acute Rehab PT Goals Patient Stated Goal: return home without oxygen PT Goal Formulation: With patient Time For Goal Achievement: 01/06/23 Potential to Achieve Goals: Good    Frequency Min 3X/week     Co-evaluation               AM-PAC PT "6 Clicks" Mobility  Outcome Measure Help needed turning from your back to your side while in a flat bed without using bedrails?: A Little Help needed moving from lying on your back to sitting on the side of a flat bed without using bedrails?: A Little Help needed moving to and from a bed to a chair (including a wheelchair)?: A Little Help needed standing up from a chair using your arms (e.g., wheelchair or bedside chair)?: A Little Help needed to walk in hospital room?: A Little Help needed climbing 3-5 steps with a railing? : A Little 6 Click Score: 18    End of Session Equipment Utilized During Treatment: Gait belt Activity Tolerance: Patient tolerated treatment well Patient left: in bed;with call bell/phone within reach;with nursing/sitter in room Nurse Communication: Mobility status;Other (comment) (O2 sats) PT Visit Diagnosis: Other abnormalities of gait and mobility (R26.89)  Time: [id number] PT Time Calculation (min) (ACUTE ONLY): 32 min   Charges:   PT Evaluation $PT Re-evaluation: 1 Re-eval PT Treatments $Gait Training: 8-22 mins        Rolinda Roan, PT, DPT Acute Rehabilitation Services Secure Chat Preferred Office: [phone]   Thelma Comp 12/23/2022, 1:59 PM

## 2022-12-24 ENCOUNTER — Inpatient Hospital Stay (HOSPITAL_COMMUNITY): Payer: 59

## 2022-12-24 LAB — COMPREHENSIVE METABOLIC PANEL
ALT: 33 U/L (ref 0–44)
AST: 15 U/L (ref 15–41)
Albumin: 3 g/dL — ABNORMAL LOW (ref 3.5–5.0)
Alkaline Phosphatase: 50 U/L (ref 38–126)
Anion gap: 8 (ref 5–15)
BUN: 26 mg/dL — ABNORMAL HIGH (ref 8–23)
CO2: 42 mmol/L — ABNORMAL HIGH (ref 22–32)
Calcium: 8.3 mg/dL — ABNORMAL LOW (ref 8.9–10.3)
Chloride: 91 mmol/L — ABNORMAL LOW (ref 98–111)
Creatinine, Ser: 1.35 mg/dL — ABNORMAL HIGH (ref 0.44–1.00)
GFR, Estimated: 41 mL/min — ABNORMAL LOW (ref >60)
Glucose, Bld: 133 mg/dL — ABNORMAL HIGH (ref 70–99)
Potassium: 4 mmol/L (ref 3.5–5.1)
Sodium: 141 mmol/L (ref 135–145)
Total Bilirubin: 0.6 mg/dL (ref 0.3–1.2)
Total Protein: 6.9 g/dL (ref 6.5–8.1)

## 2022-12-24 LAB — CBC WITH DIFFERENTIAL/PLATELET
Abs Immature Granulocytes: 0.03 10*3/uL (ref 0.00–0.07)
Basophils Absolute: 0 10*3/uL (ref 0.0–0.1)
Basophils Relative: 1 %
Eosinophils Absolute: 0.4 10*3/uL (ref 0.0–0.5)
Eosinophils Relative: 5 %
HCT: 34.1 % — ABNORMAL LOW (ref 36.0–46.0)
Hemoglobin: 10.9 g/dL — ABNORMAL LOW (ref 12.0–15.0)
Immature Granulocytes: 0 %
Lymphocytes Relative: 23 %
Lymphs Abs: 2 10*3/uL (ref 0.7–4.0)
MCH: 30.2 pg (ref 26.0–34.0)
MCHC: 32 g/dL (ref 30.0–36.0)
MCV: 94.5 fL (ref 80.0–100.0)
Monocytes Absolute: 1 10*3/uL (ref 0.1–1.0)
Monocytes Relative: 11 %
Neutro Abs: 5.2 10*3/uL (ref 1.7–7.7)
Neutrophils Relative %: 60 %
Platelets: 274 10*3/uL (ref 150–400)
RBC: 3.61 MIL/uL — ABNORMAL LOW (ref 3.87–5.11)
RDW: 15.7 % — ABNORMAL HIGH (ref 11.5–15.5)
WBC: 8.6 10*3/uL (ref 4.0–10.5)
nRBC: 0 % (ref 0.0–0.2)

## 2022-12-24 LAB — MAGNESIUM: Magnesium: 2.4 mg/dL (ref 1.7–2.4)

## 2022-12-24 LAB — PHOSPHORUS: Phosphorus: 4.1 mg/dL (ref 2.5–4.6)

## 2022-12-24 MED ORDER — FUROSEMIDE 40 MG PO TABS
40.0000 mg | ORAL_TABLET | Freq: Every day | ORAL | Status: DC
  Administered 2022-12-25 – 2022-12-26 (×2): 40 mg via ORAL
  Filled 2022-12-24 (×2): qty 1

## 2022-12-24 NOTE — Progress Notes (Signed)
Patient's  BP is 201/80 and MAP is 115. I checked it multiple times and that's the least I've got. She's asymptomatic.  Notified Dr. Myna Hidalgo and received a new order for Hydralazine 50 mg every 6 hours PRN. Will administer and continue to monitor.

## 2022-12-24 NOTE — Plan of Care (Signed)

## 2022-12-24 NOTE — Progress Notes (Signed)
Occupational Therapy Treatment Patient Details Name: Sherry Fitzpatrick MRN: [id number] DOB: 23-Sep-1951 Today's Date: 12/24/2022   History of present illness Pt is a 72 y.o. female presenting 12/15/22 with shortness of breath. VQ scan negative; resp panel negative; admitted with COPD exacerbation PMH significant for cognitive impairment, diabetes, diastolic CHF, stroke, A-fib, OSA, GERD, anemia, obesity, hyperlipidemia, CKD 3, asthma, anxiety, depression  +pulmonary edema.   OT comments  Patient with fair progress toward patient focused goals.  Poor activity tolerance and poor dynamic balance continue to be the primary deficits.  RN trying patient on RA.  Patient ranging from 88% to 95% on RA.  A lot of education was provided on PLB, and exhaling on the exertion to prevent from holding her breath.  Patient verbalized understanding, but will need continued acute OT to address deficits, and continue to progress ADL independence for eventual return home.     Recommendations for follow up therapy are one component of a multi-disciplinary discharge planning process, led by the attending physician.  Recommendations may be updated based on patient status, additional functional criteria and insurance authorization.    Follow Up Recommendations  No OT follow up     Assistance Recommended at Discharge Intermittent Supervision/Assistance  Patient can return home with the following  Assistance with cooking/housework   Equipment Recommendations  None recommended by OT    Recommendations for Other Services      Precautions / Restrictions Precautions Precautions: Fall Precaution Comments: Watch O2, RN trying on RA 3/11 Restrictions Weight Bearing Restrictions: No       Mobility Bed Mobility Overal bed mobility: Modified Independent                  Transfers Overall transfer level: Needs assistance Equipment used: Rolling walker (2 wheels) Transfers: Sit to/from Stand Sit to Stand:  Supervision, Min guard                 Balance Overall balance assessment: Needs assistance Sitting-balance support: Bilateral upper extremity supported, Feet supported Sitting balance-Leahy Scale: Good     Standing balance support: Reliant on assistive device for balance Standing balance-Leahy Scale: Poor                             ADL either performed or assessed with clinical judgement   ADL       Grooming: Wash/dry hands;Wash/dry face;Oral care;Set up;Sitting           Upper Body Dressing : Set up;Sitting   Lower Body Dressing: Minimal assistance;Sit to/from stand   Toilet Transfer: Rolling walker (2 wheels);Supervision/safety                  Extremity/Trunk Assessment Upper Extremity Assessment Upper Extremity Assessment: Generalized weakness   Lower Extremity Assessment Lower Extremity Assessment: Defer to PT evaluation   Cervical / Trunk Assessment Cervical / Trunk Assessment: Normal    Vision Patient Visual Report: No change from baseline     Perception Perception Perception: Not tested   Praxis Praxis Praxis: Not tested    Cognition Arousal/Alertness: Awake/alert Behavior During Therapy: WFL for tasks assessed/performed Overall Cognitive Status: Within Functional Limits for tasks assessed                                 General Comments: per chart, cognitive deficits at baseline. Per pt report, she receives assist with medication  management and financial management. May occasionally make simple meal independently. Pt with intermittent slowed processing, but [facility] for basic daily tasks. Oriented to day and date.                     General Comments  RA    Pertinent Vitals/ Pain       Pain Assessment Pain Assessment: No/denies pain                                                          Frequency  Min 2X/week        Progress Toward Goals  OT Goals(current goals  can now be found in the care plan section)  Progress towards OT goals: Progressing toward goals  Acute Rehab OT Goals OT Goal Formulation: With patient Time For Goal Achievement: 12/30/22 Potential to Achieve Goals: Good  Plan Discharge plan remains appropriate    Co-evaluation                 AM-PAC OT "6 Clicks" Daily Activity     Outcome Measure   Help from another person eating meals?: None Help from another person taking care of personal grooming?: None Help from another person toileting, which includes using toliet, bedpan, or urinal?: A Little Help from another person bathing (including washing, rinsing, drying)?: A Little Help from another person to put on and taking off regular upper body clothing?: None Help from another person to put on and taking off regular lower body clothing?: A Little 6 Click Score: 21    End of Session Equipment Utilized During Treatment: Rolling walker (2 wheels)  OT Visit Diagnosis: Unsteadiness on feet (R26.81)   Activity Tolerance Patient tolerated treatment well   Patient Left in chair;with call bell/phone within reach   Nurse Communication Other (comment) (O2 on RA)        [id number] OT Time Calculation (min): 26 min  Charges: OT General Charges $OT Visit: 1 Visit OT Treatments $Self Care/Home Management : 23-37 mins  12/24/2022  RP, OTR/L  Acute Rehabilitation Services  Office:  [phone]   Sherry Fitzpatrick 12/24/2022, 9:06 AM

## 2022-12-24 NOTE — Progress Notes (Signed)
Nurse requested Mobility Specialist to perform oxygen saturation test with pt which includes removing pt from oxygen both at rest and while ambulating.  Below are the results from that testing.     Patient Saturations on Room Air at Rest = spO2 96%  Patient Saturations on Room Air while Ambulating = sp02 93% .  Rested and performed pursed lip breathing for 1 minute with sp02 at 94%.  At end of testing pt left in room on 0  Liters of oxygen.  Reported results to nurse.   [place] Specialist Please contact via SecureChat or Rehab office at [phone]

## 2022-12-24 NOTE — Progress Notes (Signed)
[facility]                                                                               Sherry Fitzpatrick, is a 72 y.o. female, DOB - January 25, 1951, [id number] Admit date - 12/15/2022    Outpatient Primary MD for the patient is Glenis Smoker, MD  LOS - 7  days    Brief summary    71/F w/cognitive impairment, diabetes, diastolic CHF, stroke, A-fib, OSA, GERD, anemia, obesity, hyperlipidemia, CKD 3, asthma, anxiety, depression presenting with shortness of breath x few days. EMS was called noted to be saturating in the 80s on room air.  Some improvement following Solu-Medrol, DuoNeb, albuterol and route. In ED,creatinine stable 1.16, Troponin negative x 2. D-dimer elevated 1.31.  Respiratory panel for flu COVID RSV negative.  Chest x-ray showing findings favoring pulmonary edema.  VQ scan negative.  Patient received Lasix and DuoNeb in the ED   Assessment & Plan    Assessment and Plan:  Acute respiratory failure with h ypoxia: Resolved, probably secondary to COPD AND HF.    Acute on chronic diastolic heart failure Echo with preserved LV EF and grade 3 diastolic dysfunction, with restrictive cardiomyopathy.  Continue with IV lasix daily. Diuresed about 8 lit since admission. Transition to oral lasix today.  Continue with strict intake and output.   Acute copd exacerbation;  Much improved .  No wheezing heard.  Continue with symbicort and duonebs.    Atypical chest pain:  Resolved.  Neg trops EKG shows t WAVE inversions in inf leads.    AKI;  Baseline creatinine at 1.1 Admitted with Creatinine of 2. Improving to 1.3.      PAF;  Rate controlled with amiodarone.    CAD;  S/p PCI in 2021.  Continue with aspirin, plavix and statin.     GERD;  Stable.    Body mass index is 44.63 kg/m. Obesity:     Hyperlipidemia:  On statin.   Estimated body mass index is 44.63 kg/m as calculated from the following:   Height as of this encounter: 5'  4" (1.626 m).   Weight as of this encounter: 117.9 kg.  Code Status: full code.  DVT Prophylaxis:  enoxaparin (LOVENOX) injection 40 mg Start: 12/19/22 1915   Level of Care: Level of care: Telemetry Cardiac Family Communication: None at bedside.   Disposition Plan:     Remains inpatient appropriate:  monitor renal parameters.   Procedures:  None.   Consultants:   None.  Antimicrobials:   Anti-infectives (From admission, onward)    None        Medications  Scheduled Meds:  amiodarone  100 mg Oral Daily   amLODipine  5 mg Oral Daily   aspirin  81 mg Oral Daily   atorvastatin  80 mg Oral Daily   clopidogrel  75 mg Oral Daily   diclofenac Sodium  2 g Topical QID   enoxaparin (LOVENOX) injection  40 mg Subcutaneous Q24H   furosemide  40 mg Intravenous Daily   ipratropium-albuterol  3 mL Nebulization BID   isosorbide mononitrate  60 mg Oral Daily   mometasone-formoterol  2 puff Inhalation BID  pantoprazole  40 mg Oral QHS   sodium chloride flush  3 mL Intravenous Q12H   Continuous Infusions: PRN Meds:.acetaminophen **OR** acetaminophen, albuterol, alum & mag hydroxide-simeth, guaiFENesin-dextromethorphan, mouth rinse, polyethylene glycol    Subjective:   Sherry Fitzpatrick was seen and examined today.  Breathing as improved. But she is not back to baseline.   Objective:   Vitals:   12/24/22 0717 12/24/22 0828 12/24/22 0901 12/24/22 1141  BP: (!) 173/76 (!) 152/72    Pulse: 61 66 64   Resp: '19 16 18   '$ Temp: 98.5 F (36.9 C)     TempSrc: Oral     SpO2: 97% 94% 96% 96%  Weight:      Height:        Intake/Output Summary (Last 24 hours) at 12/24/2022 1154 Last data filed at 12/24/2022 1141 Gross per 24 hour  Intake 820 ml  Output 3700 ml  Net -2880 ml   Filed Weights   12/22/22 0215 12/23/22 0300 12/24/22 0533  Weight: 122.6 kg 123.1 kg 117.9 kg     Exam General exam: Appears calm and comfortable  Respiratory system: Clear to auscultation. Respiratory  effort normal. Cardiovascular system: S1 & S2 heard, RRR. No JVD, murmurs, rubs, gallops or clicks. No pedal edema. Gastrointestinal system: Abdomen is nondistended, soft and nontender.  Central nervous system: Alert and oriented. No focal neurological deficits. Extremities: Symmetric 5 x 5 power. Skin: No rashes, lesions or ulcers Psychiatry:Mood & affect appropriate.  -  Data Reviewed:  I have personally reviewed following labs and imaging studies   CBC Lab Results  Component Value Date   WBC 8.6 12/24/2022   RBC 3.61 (L) 12/24/2022   HGB 10.9 (L) 12/24/2022   HCT 34.1 (L) 12/24/2022   MCV 94.5 12/24/2022   MCH 30.2 12/24/2022   PLT 274 12/24/2022   MCHC 32.0 12/24/2022   RDW 15.7 (H) 12/24/2022   LYMPHSABS 2.0 12/24/2022   MONOABS 1.0 12/24/2022   EOSABS 0.4 12/24/2022   BASOSABS 0.0 0000000     Last metabolic panel Lab Results  Component Value Date   NA 141 12/24/2022   K 4.0 12/24/2022   CL 91 (L) 12/24/2022   CO2 42 (H) 12/24/2022   BUN 26 (H) 12/24/2022   CREATININE 1.35 (H) 12/24/2022   GLUCOSE 133 (H) 12/24/2022   GFRNONAA 41 (L) 12/24/2022   GFRAA 60 (L) 06/09/2020   CALCIUM 8.3 (L) 12/24/2022   PHOS 4.1 12/24/2022   PROT 6.9 12/24/2022   ALBUMIN 3.0 (L) 12/24/2022   BILITOT 0.6 12/24/2022   ALKPHOS 50 12/24/2022   AST 15 12/24/2022   ALT 33 12/24/2022   ANIONGAP 8 12/24/2022    CBG (last 3)  Recent Labs    12/23/22 0624  GLUCAP 147*      Coagulation Profile: No results for input(s): "INR", "PROTIME" in the last 168 hours.   Radiology Studies: DG CHEST PORT 1 VIEW  Result Date: 12/24/2022 CLINICAL DATA:  Shortness of breath EXAM: PORTABLE CHEST 1 VIEW COMPARISON:  Multiple priors including radiograph December 21, 2022. FINDINGS: Similar cardiomegaly with central vascular prominence. Interval decrease in the bilateral interstitial and airspace opacities. No visible pleural effusion or pneumothorax. No acute osseous abnormality. IMPRESSION:  Interval decrease in the bilateral interstitial and airspace opacities, likely representing improving pulmonary edema. Electronically Signed   By: Dahlia Bailiff M.D.   On: 12/24/2022 08:20       Hosie Poisson M.D. [facility] 12/24/2022, 11:54 AM  Available via [employer]  secure chat 7am-7pm After 7 pm, please refer to night coverage provider listed on amion.

## 2022-12-25 MED ORDER — AMLODIPINE BESYLATE 10 MG PO TABS
10.0000 mg | ORAL_TABLET | Freq: Every day | ORAL | Status: DC
  Administered 2022-12-26: 10 mg via ORAL
  Filled 2022-12-25: qty 1

## 2022-12-25 MED ORDER — HYDRALAZINE HCL 50 MG PO TABS
50.0000 mg | ORAL_TABLET | Freq: Four times a day (QID) | ORAL | Status: DC | PRN
  Administered 2022-12-25: 50 mg via ORAL
  Filled 2022-12-25: qty 1

## 2022-12-25 MED ORDER — ISOSORBIDE MONONITRATE ER 60 MG PO TB24
120.0000 mg | ORAL_TABLET | Freq: Every day | ORAL | Status: DC
  Administered 2022-12-26: 120 mg via ORAL
  Filled 2022-12-25: qty 2

## 2022-12-25 MED ORDER — AMLODIPINE BESYLATE 5 MG PO TABS
5.0000 mg | ORAL_TABLET | Freq: Once | ORAL | Status: AC
  Administered 2022-12-25: 5 mg via ORAL
  Filled 2022-12-25: qty 1

## 2022-12-25 MED ORDER — ENOXAPARIN SODIUM 60 MG/0.6ML IJ SOSY
60.0000 mg | PREFILLED_SYRINGE | INTRAMUSCULAR | Status: DC
  Administered 2022-12-25: 60 mg via SUBCUTANEOUS
  Filled 2022-12-25: qty 0.6

## 2022-12-25 MED ORDER — ISOSORBIDE MONONITRATE ER 60 MG PO TB24
60.0000 mg | ORAL_TABLET | Freq: Once | ORAL | Status: AC
  Administered 2022-12-25: 60 mg via ORAL
  Filled 2022-12-25: qty 1

## 2022-12-25 NOTE — Progress Notes (Signed)
Physical Therapy Treatment Patient Details Name: Sherry Fitzpatrick MRN: [id number] DOB: 22-Mar-1951 Today's Date: 12/25/2022   History of Present Illness Pt is a 72 y.o. female presenting 12/15/22 with shortness of breath. VQ scan negative; resp panel negative; admitted with COPD exacerbation PMH significant for cognitive impairment, diabetes, diastolic CHF, stroke, A-fib, OSA, GERD, anemia, obesity, hyperlipidemia, CKD 3, asthma, anxiety, depression  +pulmonary edema.    PT Comments    Pt OOB mobility limited this date by SOB, 8/10 headache, and BP at 210/105. Pt attempted to stand and ambulate x 2 however unable. BP went down to 183/72 upon return to supine however remains to have headache. RN made aware and was getting medication. Pt's SpO2 did stay >95% on RA t/o session despite DOE and SOB. Acute PT to return as able to progress mobility once BP under control.    Recommendations for follow up therapy are one component of a multi-disciplinary discharge planning process, led by the attending physician.  Recommendations may be updated based on patient status, additional functional criteria and insurance authorization.  Follow Up Recommendations  Home health PT     Assistance Recommended at Discharge Intermittent Supervision/Assistance  Patient can return home with the following Assistance with cooking/housework;Direct supervision/assist for medications management;Direct supervision/assist for financial management;Assist for transportation;Help with stairs or ramp for entrance;A little help with walking and/or transfers;A little help with bathing/dressing/bathroom   Equipment Recommendations  None recommended by PT    Recommendations for Other Services       Precautions / Restrictions Precautions Precautions: Fall Precaution Comments: watch O2 and BP Restrictions Weight Bearing Restrictions: No     Mobility  Bed Mobility Overal bed mobility: Modified Independent Bed Mobility: Sit to  Supine       Sit to supine: Modified independent (Device/Increase time)   General bed mobility comments: pt was able to return herself to supine with increased time, noted SOB, SpO2 at 95% on RA    Transfers Overall transfer level: Needs assistance Equipment used: None Transfers: Sit to/from Stand Sit to Stand: Min guard           General transfer comment: min guard due to headache and not feeling well, completed 1 sit to stand without AD and had to return to sitting due to "oooo my head is feeling whoozy" BP taken at 180/101 and then 210/105. Pt attempted to try to stand and amb again with RW despite recommendations not to from PT however pt unable to maintain standing due to headache pain. BP 183/72 upon return to bed.    Ambulation/Gait               General Gait Details: unable to ambulate due to BP 210/105   Stairs             Wheelchair Mobility    Modified Rankin (Stroke Patients Only)       Balance Overall balance assessment: Needs assistance Sitting-balance support: Feet supported, No upper extremity supported Sitting balance-Leahy Scale: Good     Standing balance support: Reliant on assistive device for balance, Bilateral upper extremity supported Standing balance-Leahy Scale: Poor                              Cognition Arousal/Alertness: Awake/alert Behavior During Therapy: WFL for tasks assessed/performed Overall Cognitive Status: Within Functional Limits for tasks assessed  General Comments: pt with delayed processing but reports 8/10 headache and not feeling well, pt was willing to participate and try        Exercises      General Comments General comments (skin integrity, edema, etc.): Pt SpO2 >95% on RA even with activity however BP high at 210*105, RN aware and getting pain meds. Pt very SOB s/p donning socks at EOB      Pertinent Vitals/Pain Pain Assessment Pain  Assessment: 0-10 Pain Score: 8  Pain Location: frontal headache Pain Descriptors / Indicators: Headache Pain Intervention(s): Patient requesting pain meds-RN notified    Home Living                          Prior Function            PT Goals (current goals can now be found in the care plan section) Progress towards PT goals: Progressing toward goals    Frequency    Min 3X/week      PT Plan Current plan remains appropriate    Co-evaluation              AM-PAC PT "6 Clicks" Mobility   Outcome Measure  Help needed turning from your back to your side while in a flat bed without using bedrails?: A Little Help needed moving from lying on your back to sitting on the side of a flat bed without using bedrails?: A Little Help needed moving to and from a bed to a chair (including a wheelchair)?: A Little Help needed standing up from a chair using your arms (e.g., wheelchair or bedside chair)?: A Little Help needed to walk in hospital room?: A Little Help needed climbing 3-5 steps with a railing? : A Little 6 Click Score: 18    End of Session   Activity Tolerance: Patient limited by pain Patient left: in bed;with call bell/phone within reach;with bed alarm set Nurse Communication: Mobility status (HIGH BP) PT Visit Diagnosis: Other abnormalities of gait and mobility (R26.89)     Time: [id number] PT Time Calculation (min) (ACUTE ONLY): 27 min  Charges:  $Therapeutic Activity: 23-37 mins                     [name], PT, DPT Acute Rehabilitation Services Secure chat preferred Office #: [phone]    [name] 12/25/2022, 2:22 PM

## 2022-12-25 NOTE — Care Management Important Message (Signed)
Important Message  Patient Details  Name: Sherry Fitzpatrick MRN: [id number] Date of Birth: [birth year]   Medicare Important Message Given:  Yes     [name] 12/25/2022, 9:21 AM

## 2022-12-25 NOTE — Progress Notes (Signed)
[facility]                                                                               Sherry Fitzpatrick, is a 72 y.o. female, DOB - Feb 26, 1951, [id number] Admit date - 12/15/2022    Outpatient Primary MD for the patient is Sherry Smoker, MD  LOS - 8  days    Brief summary    71/F w/cognitive impairment, diabetes, diastolic CHF, stroke, A-fib, OSA, GERD, anemia, obesity, hyperlipidemia, CKD 3, asthma, anxiety, depression presenting with shortness of breath x few days. EMS was called noted to be saturating in the 80s on room air.  Some improvement following Solu-Medrol, DuoNeb, albuterol and route. In ED,creatinine stable 1.16, Troponin negative x 2. D-dimer elevated 1.31.  Respiratory panel for flu COVID RSV negative.  Chest x-ray showing findings favoring pulmonary edema.  VQ scan negative.  Patient received Lasix and DuoNeb in the ED   Assessment & Plan    Assessment and Plan:  Acute respiratory failure with hypoxia: Resolved, probably secondary to COPD AND HF.  Cxr Interval decrease in the bilateral interstitial and airspace opacities, likely representing improving pulmonary edema.    Acute on chronic diastolic heart failure Echo with preserved LV EF and grade 3 diastolic dysfunction, with restrictive cardiomyopathy.  He was started on IV lasix. Diuresed about 9.6 lit since admission. Transition to oral lasix 40 mg daily.   Continue with strict intake and output.   Acute copd exacerbation;  Much improved .  No wheezing heard.  Continue with symbicort and duonebs.    Atypical chest pain:  Resolved.  Neg trops EKG shows t WAVE inversions in inf leads.    AKI;  Baseline creatinine at 1.1 Admitted with Creatinine of 2.  Creatinine stabilized at 1.35.     PAF;  Rate controlled with amiodarone.  Not on anticoagulation.   CAD;  S/p PCI in 2021.  Continue with aspirin, plavix and statin.     GERD;  Stable.    Body mass index is 45.09  kg/m. Obesity:    Hypertension: Not well controlled.  Increased norvasc to 10 mg daily and Imdur 120 mg daily.      Hyperlipidemia:  On statin.   Estimated body mass index is 45.09 kg/m as calculated from the following:   Height as of this encounter: '5\' 4"'$  (1.626 m).   Weight as of this encounter: 119.2 kg.  Code Status: full code.  DVT Prophylaxis:     Level of Care: Level of care: Telemetry Cardiac Family Communication: None at bedside.   Disposition Plan:     Remains inpatient appropriate: if BP improves, can d/c home in am.   Procedures:  None.   Consultants:   None.  Antimicrobials:   Anti-infectives (From admission, onward)    None        Medications  Scheduled Meds:  amiodarone  100 mg Oral Daily   [START ON 12/26/2022] amLODipine  10 mg Oral Daily   aspirin  81 mg Oral Daily   atorvastatin  80 mg Oral Daily   clopidogrel  75 mg Oral Daily   diclofenac Sodium  2 g Topical QID  enoxaparin (LOVENOX) injection  60 mg Subcutaneous Q24H   furosemide  40 mg Oral Daily   ipratropium-albuterol  3 mL Nebulization BID   [START ON 12/26/2022] isosorbide mononitrate  120 mg Oral Daily   mometasone-formoterol  2 puff Inhalation BID   pantoprazole  40 mg Oral QHS   sodium chloride flush  3 mL Intravenous Q12H   Continuous Infusions: PRN Meds:.acetaminophen **OR** acetaminophen, albuterol, alum & mag hydroxide-simeth, guaiFENesin-dextromethorphan, hydrALAZINE, mouth rinse, polyethylene glycol    Subjective:   Sherry Fitzpatrick was seen and examined today.  Headache,. Elevated BP.   Objective:   Vitals:   12/25/22 0906 12/25/22 0942 12/25/22 0946 12/25/22 1205  BP:  (!) 199/71 (!) 187/75 (!) 156/109  Pulse:    74  Resp:    18  Temp:    98.1 F (36.7 C)  TempSrc:    Oral  SpO2: 94%  96% 97%  Weight:      Height:        Intake/Output Summary (Last 24 hours) at 12/25/2022 1416 Last data filed at 12/25/2022 1333 Gross per 24 hour  Intake 600 ml   Output 2550 ml  Net -1950 ml    Filed Weights   12/23/22 0300 12/24/22 0533 12/25/22 0304  Weight: 123.1 kg 117.9 kg 119.2 kg     Exam General exam: Appears calm and comfortable  Respiratory system: Clear to auscultation. Respiratory effort normal. Cardiovascular system: S1 & S2 heard, RRR. No JVD, murmurs,  Gastrointestinal system: Abdomen is nondistended, soft and nontender.  Central nervous system: Alert and oriented. No focal neurological deficits. Extremities: Symmetric 5 x 5 power. Skin: No rashes, Psychiatry:  Mood & affect appropriate.    Data Reviewed:  I have personally reviewed following labs and imaging studies   CBC Lab Results  Component Value Date   WBC 8.6 12/24/2022   RBC 3.61 (L) 12/24/2022   HGB 10.9 (L) 12/24/2022   HCT 34.1 (L) 12/24/2022   MCV 94.5 12/24/2022   MCH 30.2 12/24/2022   PLT 274 12/24/2022   MCHC 32.0 12/24/2022   RDW 15.7 (H) 12/24/2022   LYMPHSABS 2.0 12/24/2022   MONOABS 1.0 12/24/2022   EOSABS 0.4 12/24/2022   BASOSABS 0.0 0000000     Last metabolic panel Lab Results  Component Value Date   NA 141 12/24/2022   K 4.0 12/24/2022   CL 91 (L) 12/24/2022   CO2 42 (H) 12/24/2022   BUN 26 (H) 12/24/2022   CREATININE 1.35 (H) 12/24/2022   GLUCOSE 133 (H) 12/24/2022   GFRNONAA 41 (L) 12/24/2022   GFRAA 60 (L) 06/09/2020   CALCIUM 8.3 (L) 12/24/2022   PHOS 4.1 12/24/2022   PROT 6.9 12/24/2022   ALBUMIN 3.0 (L) 12/24/2022   BILITOT 0.6 12/24/2022   ALKPHOS 50 12/24/2022   AST 15 12/24/2022   ALT 33 12/24/2022   ANIONGAP 8 12/24/2022    CBG (last 3)  Recent Labs    12/23/22 0624  GLUCAP 147*       Coagulation Profile: No results for input(s): "INR", "PROTIME" in the last 168 hours.   Radiology Studies: DG CHEST PORT 1 VIEW  Result Date: 12/24/2022 CLINICAL DATA:  Shortness of breath EXAM: PORTABLE CHEST 1 VIEW COMPARISON:  Multiple priors including radiograph December 21, 2022. FINDINGS: Similar cardiomegaly  with central vascular prominence. Interval decrease in the bilateral interstitial and airspace opacities. No visible pleural effusion or pneumothorax. No acute osseous abnormality. IMPRESSION: Interval decrease in the bilateral interstitial and airspace opacities, likely  representing improving pulmonary edema. Electronically Signed   By: Dahlia Bailiff M.D.   On: 12/24/2022 08:20       Hosie Poisson M.D. [facility] 12/25/2022, 2:16 PM  Available via [employer] secure chat 7am-7pm After 7 pm, please refer to night coverage provider listed on amion.

## 2022-12-25 NOTE — Progress Notes (Signed)
PT Cancellation Note  Patient Details Name: Sherry Fitzpatrick MRN: [id number] DOB: Nov 22, 1950   Cancelled Treatment:    Reason Eval/Treat Not Completed: Other (comment). Pt declined ambulating at this time as pt reports "I didn't go to bed until 5:30am, I need to rest. Come back later." When asked why she didn't go to bed until 5:30am pt stated "I was just doing what I liked to do and I just need to rest now. I am tired." Pt educated on role of PT and importance of mobility. PT to return as able to progress mobility.  [name], PT, DPT Acute Rehabilitation Services Secure chat preferred Office #: [phone]    [name] 12/25/2022, 8:18 AM

## 2022-12-25 NOTE — TOC Progression Note (Signed)
Transition of Care [facility]) - Progression Note    Patient Details  Name: HEATHERMARIE SCHISLER MRN: [id number] Date of Birth: 01-08-1951  Transition of Care [facility]) CM/SW Contact  Zenon Mayo, RN Phone Number: 12/25/2022, 11:34 AM  Clinical Narrative:    NCM offered choice for HHPT, with Medicare. Gov,she states she does not have a preference.  NCM made referral to [facility] with [place].  She is able to take referral for HHPT, soc will begin 24 to 48 hrs post dc.    Expected Discharge Plan: [place] Barriers to Discharge: Continued Medical Work up  Expected Discharge Plan and Services In-house Referral: NA Discharge Planning Services: CM Consult Post Acute Care Choice: [place] arrangements for the past 2 months: Single Family Home                 DME Arranged: N/A DME Agency: NA       HH Arranged: PT HH Agency: [place] Date [place]: 12/25/22 Time [place]: [id number] Representative spoke with at [place]: [place] Determinants of Health ([place]) Interventions SDOH Screenings   Tobacco Use: Low Risk  (12/15/2022)    Readmission Risk Interventions    12/18/2022    2:36 PM  Readmission Risk Prevention Plan  Transportation Screening Complete  PCP or Specialist Appt within 5-7 Days Complete  Home Care Screening Complete  Medication Review (RN CM) Complete

## 2022-12-26 ENCOUNTER — Other Ambulatory Visit (HOSPITAL_COMMUNITY): Payer: Self-pay

## 2022-12-26 DIAGNOSIS — G4733 Obstructive sleep apnea (adult) (pediatric): Secondary | ICD-10-CM

## 2022-12-26 LAB — BASIC METABOLIC PANEL
Anion gap: 12 (ref 5–15)
BUN: 22 mg/dL (ref 8–23)
CO2: 30 mmol/L (ref 22–32)
Calcium: 8.7 mg/dL — ABNORMAL LOW (ref 8.9–10.3)
Chloride: 98 mmol/L (ref 98–111)
Creatinine, Ser: 1.14 mg/dL — ABNORMAL HIGH (ref 0.44–1.00)
GFR, Estimated: 51 mL/min — ABNORMAL LOW (ref >60)
Glucose, Bld: 122 mg/dL — ABNORMAL HIGH (ref 70–99)
Potassium: 3.8 mmol/L (ref 3.5–5.1)
Sodium: 140 mmol/L (ref 135–145)

## 2022-12-26 MED ORDER — ALBUTEROL SULFATE HFA 108 (90 BASE) MCG/ACT IN AERS
2.0000 | INHALATION_SPRAY | Freq: Four times a day (QID) | RESPIRATORY_TRACT | 0 refills | Status: DC | PRN
  Filled 2022-12-26: qty 6.7, 17d supply, fill #0

## 2022-12-26 MED ORDER — HYDRALAZINE HCL 25 MG PO TABS
25.0000 mg | ORAL_TABLET | Freq: Three times a day (TID) | ORAL | Status: DC
  Administered 2022-12-26: 25 mg via ORAL
  Filled 2022-12-26: qty 1

## 2022-12-26 MED ORDER — ALBUTEROL SULFATE HFA 108 (90 BASE) MCG/ACT IN AERS: 2.0000 | INHALATION_SPRAY | Freq: Four times a day (QID) | RESPIRATORY_TRACT | 0 refills | Status: AC | PRN

## 2022-12-26 MED ORDER — ATORVASTATIN CALCIUM 80 MG PO TABS
80.0000 mg | ORAL_TABLET | Freq: Every day | ORAL | 3 refills | Status: DC
  Filled 2022-12-26: qty 30, 30d supply, fill #0

## 2022-12-26 MED ORDER — AMLODIPINE BESYLATE 10 MG PO TABS
10.0000 mg | ORAL_TABLET | Freq: Every day | ORAL | 2 refills | Status: DC
  Filled 2022-12-26: qty 30, 30d supply, fill #0

## 2022-12-26 MED ORDER — POTASSIUM CHLORIDE CRYS ER 20 MEQ PO TBCR
20.0000 meq | EXTENDED_RELEASE_TABLET | Freq: Every day | ORAL | 1 refills | Status: AC
  Filled 2022-12-26: qty 30, 30d supply, fill #0

## 2022-12-26 MED ORDER — CARVEDILOL 6.25 MG PO TABS
6.2500 mg | ORAL_TABLET | Freq: Two times a day (BID) | ORAL | Status: DC
  Administered 2022-12-26 (×2): 6.25 mg via ORAL
  Filled 2022-12-26: qty 1

## 2022-12-26 MED ORDER — FUROSEMIDE 40 MG PO TABS
40.0000 mg | ORAL_TABLET | Freq: Every day | ORAL | 2 refills | Status: AC
  Filled 2022-12-26: qty 30, 30d supply, fill #0

## 2022-12-26 MED ORDER — DOCUSATE SODIUM 100 MG PO CAPS
100.0000 mg | ORAL_CAPSULE | Freq: Two times a day (BID) | ORAL | 0 refills | Status: DC
  Filled 2022-12-26: qty 60, 30d supply, fill #0

## 2022-12-26 MED ORDER — CARVEDILOL 6.25 MG PO TABS
6.2500 mg | ORAL_TABLET | Freq: Two times a day (BID) | ORAL | 2 refills | Status: DC
  Filled 2022-12-26: qty 30, 15d supply, fill #0

## 2022-12-26 MED ORDER — CARVEDILOL 6.25 MG PO TABS
6.2500 mg | ORAL_TABLET | Freq: Two times a day (BID) | ORAL | 2 refills | Status: AC
  Filled 2022-12-26: qty 60, 30d supply, fill #0

## 2022-12-26 MED ORDER — HYDRALAZINE HCL 25 MG PO TABS
25.0000 mg | ORAL_TABLET | Freq: Three times a day (TID) | ORAL | 2 refills | Status: AC
  Filled 2022-12-26: qty 90, 30d supply, fill #0

## 2022-12-26 NOTE — Discharge Summary (Signed)
Physician Discharge Summary  Sherry Fitzpatrick [id number] DOB: August 31, 1951 DOA: 12/15/2022  PCP: Glenis Smoker, MD  Admit date: 12/15/2022 Discharge date: 12/26/2022  Admitted From: Home  Discharge disposition: Home with home health PT.  Recommendations for Outpatient Follow-Up:   Follow up with your primary care provider in one week.  Check CBC, BMP, magnesium in the next visit  Discharge Diagnosis:   Principal Problem:   COPD exacerbation ([place]) Active Problems:   OSA (obstructive sleep apnea)   Hyperlipidemia   Essential hypertension   Asthma   Morbid obesity (HCC)   CKD (chronic kidney disease), stage III (HCC)   COPD (chronic obstructive pulmonary disease) (HCC)   Normocytic anemia   History of CVA (cerebrovascular accident)   Chronic systolic congestive heart failure (HCC)   Status post coronary artery stent placement   PAF (paroxysmal atrial fibrillation) ([place])   Type 2 diabetes mellitus with hyperglycemia, with long-term current use of insulin (HCC)   Anxiety and depression   Esophageal reflux   Acute respiratory failure with hypoxia ([place])   Discharge Condition: Improved.  Diet recommendation: Low sodium, heart healthy.  Carbohydrate-modified.   Wound care: None.  Code status: Full.   History of Present Illness:   72 years old female with past medical history of cognitive impairment, diabetes mellitus type 2, diastolic congestive heart failure, stroke, atrial fibrillation, obstructive sleep apnea, GERD, anemia, obesity, hyperlipidemia and CKD stage III, asthma, anxiety presented to hospital with shortness of breath for few days.  EMS noted that she was hypoxic with saturation of 80% on room air.  Was given Solu-Medrol DuoNeb and was brought into the hospital.  In the ED patient had elevated D-dimer but respiratory panel was negative for flu COVID or RSV.  Chest x-ray showed pulmonary edema.  VQ scan was negative.  Patient was then admitted hospital for  further evaluation and treatment.     Hospital Course:   Following conditions were addressed during hospitalization as listed below,  Acute respiratory failure with hypoxia: Resolved at this time.  Thought to be secondary to COPD and congestive heart failure.  Chest x-ray with improving opacities.  Is been weaned off oxygen.   Acute on chronic diastolic heart failure Recent 2D echo on 12/16/2022 with preserved LV EF and grade 3 diastolic dysfunction, with restrictive cardiomyopathy.  She had initially received on IV Lasix with significant diuresis.  Has been transitioned to oral diuretic at this time.   Acute copd exacerbation;  Improved with Symbicort and DuoNebs.  Will continue on inhalers discharge   Atypical chest pain:  Resolved.  Negative troponin.  EKG showed T wave inversions.     AKI;  Baseline creatinine of 1.1.  Creatinine has improved back to 1.1 and his baseline at this time.  Check BMP as outpatient    Paroxysmal atrial fibrillation. Rate controlled with amiodarone.  Not on anticoagulation.  Continue amiodarone on discharge  Coronary artery disease status post PCI 2021 Continue with aspirin, plavix and statin.    GERD;  Stable.  On Protonix.   Morbid obesity.  Patient would benefit from weight loss as outpatient.Body mass index is 45.09 kg/m.   Accelerated hypertension.   Hydralazine has been added to the regimen of Norvasc Imdur and Coreg from home.  Losartan discontinued due to renal failure.  Patient was emphasized the need for regularity taking her medications.  Hyperlipidemia:  Continue statin.   Disposition.  At this time, patient is stable for disposition home with outpatient PCP  follow-up.  Medical Consultants:   None.  Procedures:    None Subjective:   Today, patient was seen and examined at bedside.  Continues to feel better.  No chest pain dizziness lightheadedness or shortness of breath.  Discharge Exam:   Vitals:   12/26/22 0755  12/26/22 1114  BP:  (!) 144/79  Pulse:    Resp:    Temp:    SpO2: 95%    Vitals:   12/26/22 0400 12/26/22 0734 12/26/22 0755 12/26/22 1114  BP: (!) 167/61 (!) 193/82  (!) 144/79  Pulse: 62     Resp: 18 20    Temp: 97.6 F (36.4 C)     TempSrc: Oral     SpO2: 96% 94% 95%   Weight: 116.2 kg     Height:       Body mass index is 43.98 kg/m.   General: Alert awake, not in obvious distress, morbidly obese HENT: pupils equally reacting to light,  No scleral pallor or icterus noted. Oral mucosa is moist.  Chest:  Clear breath sounds.    CVS: S1 &S2 heard. No murmur.  Regular rate and rhythm. Abdomen: Soft, nontender, nondistended.  Bowel sounds are heard.   Extremities: No cyanosis, clubbing or edema.  Peripheral pulses are palpable. Psych: Alert, awake and oriented, normal mood CNS:  No cranial nerve deficits.  Power equal in all extremities.   Skin: Warm and dry.  No rashes noted.  The results of significant diagnostics from this hospitalization (including imaging, microbiology, ancillary and laboratory) are listed below for reference.     Diagnostic Studies:   ECHOCARDIOGRAM COMPLETE  Result Date: 12/16/2022    ECHOCARDIOGRAM REPORT   Patient Name:   Sherry Fitzpatrick Date of Exam: 12/16/2022 Medical Rec #:  [id number]       Height:       64.0 in Accession #:    [id number]      Weight:       253.3 lb Date of Birth:  22-Jun-1951        BSA:          2.163 m Patient Age:    72 years        BP:           213/108 mmHg Patient Gender: F               HR:           64 bpm. Exam Location:  Inpatient Procedure: 2D Echo, Cardiac Doppler and Color Doppler Indications:    CHF-Acute Diastolic [id number]  History:        Patient has prior history of Echocardiogram examinations, most                 recent 03/28/2020. COPD, Signs/Symptoms:Obesity; Risk                 Factors:Hypertension and Dyslipidemia.  Sonographer:    Luane School RDCS Referring Phys: [id number] [place]  Sonographer Comments:  Suboptimal subcostal window. IMPRESSIONS  1. Left ventricular ejection fraction, by estimation, is 65 to 70%. The left ventricle has normal function. The left ventricle has no regional wall motion abnormalities. There is mild left ventricular hypertrophy. Left ventricular diastolic parameters are consistent with Grade III diastolic dysfunction (restrictive). Elevated left atrial pressure.  2. Right ventricular systolic function is normal. The right ventricular size is normal. There is mildly elevated pulmonary artery systolic pressure.  3. The mitral valve is normal in structure. Trivial  mitral valve regurgitation.  4. The aortic valve was not well visualized. Aortic valve regurgitation is trivial. Moderate aortic valve stenosis. Vmax 3.1 m/s, MG 22 mmHg, AVA 1.5 cm^2, DI 0.46 FINDINGS  Left Ventricle: Left ventricular ejection fraction, by estimation, is 65 to 70%. The left ventricle has normal function. The left ventricle has no regional wall motion abnormalities. The left ventricular internal cavity size was normal in size. There is  mild left ventricular hypertrophy. Left ventricular diastolic parameters are consistent with Grade III diastolic dysfunction (restrictive). Elevated left atrial pressure. Right Ventricle: The right ventricular size is normal. No increase in right ventricular wall thickness. Right ventricular systolic function is normal. There is mildly elevated pulmonary artery systolic pressure. The tricuspid regurgitant velocity is 2.91  m/s, and with an assumed right atrial pressure of 8 mmHg, the estimated right ventricular systolic pressure is [id number] mmHg. Left Atrium: Left atrial size was normal in size. Right Atrium: Right atrial size was normal in size. Pericardium: There is no evidence of pericardial effusion. Mitral Valve: The mitral valve is normal in structure. Trivial mitral valve regurgitation. Tricuspid Valve: The tricuspid valve is normal in structure. Tricuspid valve regurgitation is  mild. Aortic Valve: The aortic valve was not well visualized. Aortic valve regurgitation is trivial. Aortic regurgitation PHT measures 782 msec. Moderate aortic stenosis is present. Aortic valve mean gradient measures 21.3 mmHg. Aortic valve peak gradient measures 38.0 mmHg. Aortic valve area, by VTI measures 1.55 cm. Pulmonic Valve: The pulmonic valve was not well visualized. Pulmonic valve regurgitation is trivial. Aorta: The aortic root and ascending aorta are structurally normal, with no evidence of dilitation. IAS/Shunts: The interatrial septum was not well visualized.  LEFT VENTRICLE PLAX 2D LVIDd:         4.55 cm   Diastology LVIDs:         2.95 cm   LV e' medial:    3.78 cm/s LV PW:         1.45 cm   LV E/e' medial:  38.9 LV IVS:        1.50 cm   LV e' lateral:   7.42 cm/s LVOT diam:     2.00 cm   LV E/e' lateral: 19.8 LV SV:         104 LV SV Index:   48 LVOT Area:     3.14 cm  RIGHT VENTRICLE RV S prime:     11.00 cm/s TAPSE (M-mode): 2.5 cm LEFT ATRIUM           Index        RIGHT ATRIUM           Index LA diam:      4.60 cm 2.13 cm/m   RA Area:     19.70 cm LA Vol (A2C): 46.7 ml 21.59 ml/m  RA Volume:   56.20 ml  25.98 ml/m LA Vol (A4C): 43.5 ml 20.11 ml/m  AORTIC VALVE AV Area (Vmax):    1.40 cm AV Area (Vmean):   1.35 cm AV Area (VTI):     1.55 cm AV Vmax:           308.33 cm/s AV Vmean:          214.333 cm/s AV VTI:            0.674 m AV Peak Grad:      38.0 mmHg AV Mean Grad:      21.3 mmHg LVOT Vmax:         137.00  cm/s LVOT Vmean:        92.200 cm/s LVOT VTI:          0.332 m LVOT/AV VTI ratio: 0.49 AI PHT:            782 msec  AORTA Ao Root diam: 2.90 cm Ao Asc diam:  3.40 cm Ao Desc diam: 2.50 cm MITRAL VALVE                TRICUSPID VALVE MV Area (PHT): 3.17 cm     TR Peak grad:   33.9 mmHg MV Decel Time: 239 msec     TR Vmax:        291.00 cm/s MV E velocity: 147.00 cm/s MV A velocity: 65.50 cm/s   SHUNTS MV E/A ratio:  2.24         Systemic VTI:  0.33 m                              Systemic Diam: 2.00 cm Oswaldo Milian MD Electronically signed by Oswaldo Milian MD Signature Date/Time: 12/16/2022/12:58:07 PM    Final    NM PULMONARY VENT AND PERF (V/Q Scan)  Result Date: 12/15/2022 CLINICAL DATA:  Shortness of breath and dizziness EXAM: NUCLEAR MEDICINE PERFUSION LUNG SCAN TECHNIQUE: Perfusion images were obtained in multiple projections after intravenous injection of radiopharmaceutical. Ventilation scans intentionally deferred if perfusion scan and chest x-ray adequate for interpretation during COVID 19 epidemic. RADIOPHARMACEUTICALS:  4.3 mCi Tc-69mMAA IV COMPARISON:  Chest x-ray earlier 12/15/2022 FINDINGS: No significant perfusion defects.  Enlarged heart. IMPRESSION: Negative perfusion lung scan Electronically Signed   By: AJill SideM.D.   On: 12/15/2022 17:07   DG Chest Port 1 View  Result Date: 12/15/2022 CLINICAL DATA:  SOB EXAM: PORTABLE CHEST 1 VIEW COMPARISON:  August 08, 2020 FINDINGS: The cardiomediastinal silhouette is unchanged and enlarged in contour. No pleural effusion. No pneumothorax. Central vascular prominence with cephalization of the vasculature and mild peribronchial cuffing. IMPRESSION: Constellation of findings are favored to reflect mild pulmonary edema. Electronically Signed   By: SValentino SaxonM.D.   On: 12/15/2022 12:31     Labs:   Basic Metabolic Panel: Recent Labs  Lab 12/21/22 0932 12/22/22 0610 12/23/22 0545 12/24/22 0041 12/26/22 0026  NA 139 139 142 141 140  K 4.3 4.2 4.7 4.0 3.8  CL 100 97* 95* 91* 98  CO2 34* 33* 38* 42* 30  GLUCOSE 159* 181* 126* 133* 122*  BUN 40* 37* 28* 26* 22  CREATININE 1.62* 1.44* 1.34* 1.35* 1.14*  CALCIUM 8.1* 8.4* 8.6* 8.3* 8.7*  MG 2.3 2.2 2.2 2.4  --   PHOS 3.8 3.6 4.2 4.1  --    GFR Estimated Creatinine Clearance: 55 mL/min (A) (by C-G formula based on SCr of 1.14 mg/dL (H)). Liver Function Tests: Recent Labs  Lab 12/22/22 0610 12/23/22 0545 12/24/22 0041  AST '19 16  15  '$ ALT 44 34 33  ALKPHOS 44 46 50  BILITOT 0.7 0.7 0.6  PROT 6.8 6.7 6.9  ALBUMIN 3.1* 2.9* 3.0*   No results for input(s): "LIPASE", "AMYLASE" in the last 168 hours. No results for input(s): "AMMONIA" in the last 168 hours. Coagulation profile No results for input(s): "INR", "PROTIME" in the last 168 hours.  CBC: Recent Labs  Lab 12/21/22 0932 12/22/22 0610 12/23/22 0545 12/24/22 0041  WBC 8.4 8.4 7.9 8.6  NEUTROABS  --  4.8 4.1 5.2  HGB  10.4* 10.7* 10.6* 10.9*  HCT 32.7* 33.0* 34.1* 34.1*  MCV 94.8 94.0 95.5 94.5  PLT 262 274 267 274   Cardiac Enzymes: No results for input(s): "CKTOTAL", "CKMB", "CKMBINDEX", "TROPONINI" in the last 168 hours. BNP: Invalid input(s): "POCBNP" CBG: Recent Labs  Lab 12/23/22 0624  GLUCAP 147*   D-Dimer No results for input(s): "DDIMER" in the last 72 hours. Hgb A1c No results for input(s): "HGBA1C" in the last 72 hours. Lipid Profile No results for input(s): "CHOL", "HDL", "LDLCALC", "TRIG", "CHOLHDL", "LDLDIRECT" in the last 72 hours. Thyroid function studies No results for input(s): "TSH", "T4TOTAL", "T3FREE", "THYROIDAB" in the last 72 hours.  Invalid input(s): "FREET3" Anemia work up No results for input(s): "VITAMINB12", "FOLATE", "FERRITIN", "TIBC", "IRON", "RETICCTPCT" in the last 72 hours. Microbiology No results found for this or any previous visit (from the past 240 hour(s)).   Discharge Instructions:   Discharge Instructions     Diet general   Complete by: As directed    Discharge instructions   Complete by: As directed    Follow-up with your primary care provider in 1 week.  Check blood work at that time.  Take medications as prescribed.  Seek medical attention for worsening symptoms.   Increase activity slowly   Complete by: As directed       Allergies as of 12/26/2022       Reactions   Morphine And Related Hives, Nausea And Vomiting   Penicillins Hives   Has patient had a PCN reaction causing  immediate rash, facial/tongue/throat swelling, SOB or lightheadedness with hypotension: Yes Has patient had a PCN reaction causing severe rash involving mucus membranes or skin necrosis: No Has patient had a PCN reaction that required hospitalization: No Has patient had a PCN reaction occurring within the last 10 years: No If all of the above answers are "NO", then may proceed with Cephalosporin use.   Percocet [oxycodone-acetaminophen] Nausea And Vomiting   Ace Inhibitors Nausea And Vomiting, Cough   Iodinated Contrast Media Nausea And Vomiting      Iodine Itching   Etodolac Nausea And Vomiting   Tramadol Nausea And Vomiting   Vicodin [hydrocodone-acetaminophen] Nausea And Vomiting        Medication List     STOP taking these medications    memantine 5 MG tablet Commonly known as: NAMENDA   metFORMIN 500 MG tablet Commonly known as: Glucophage   valsartan 160 MG tablet Commonly known as: DIOVAN       TAKE these medications    acetaminophen 325 MG tablet Commonly known as: TYLENOL Take 2 tablets (650 mg total) by mouth every 6 (six) hours as needed (Mild pain). What changed:  when to take this reasons to take this   albuterol 108 (90 Base) MCG/ACT inhaler Commonly known as: VENTOLIN HFA Inhale 2 puffs into the lungs every 6 (six) hours as needed for wheezing.   amiodarone 200 MG tablet Commonly known as: PACERONE Take 100 mg by mouth daily. What changed: Another medication with the same name was removed. Continue taking this medication, and follow the directions you see here.   amLODipine 10 MG tablet Commonly known as: NORVASC Take 1 tablet (10 mg total) by mouth daily.   aspirin 81 MG chewable tablet Chew 81 mg by mouth daily.   atorvastatin 80 MG tablet Commonly known as: LIPITOR Take 1 tablet (80 mg total) by mouth daily.   budesonide-formoterol 160-4.5 MCG/ACT inhaler Commonly known as: Symbicort Inhale 2 puffs into the lungs 2 (  two) times  daily. What changed: how much to take   carvedilol 6.25 MG tablet Commonly known as: COREG Take 1 tablet (6.25 mg total) by mouth 2 (two) times daily.   clopidogrel 75 MG tablet Commonly known as: PLAVIX Take 1 tablet (75 mg total) by mouth daily.   docusate sodium 100 MG capsule Commonly known as: COLACE Take 1 capsule (100 mg total) by mouth 2 (two) times daily.   ferrous sulfate 325 (65 FE) MG tablet Take 1 tablet (325 mg total) by mouth 3 (three) times daily with meals. What changed: when to take this   furosemide 40 MG tablet Commonly known as: LASIX Take 1 tablet (40 mg total) by mouth daily at 2 PM.   hydrALAZINE 25 MG tablet Commonly known as: APRESOLINE Take 1 tablet (25 mg total) by mouth every 8 (eight) hours.   isosorbide mononitrate 120 MG 24 hr tablet Commonly known as: IMDUR Take 120 mg by mouth daily. What changed: Another medication with the same name was removed. Continue taking this medication, and follow the directions you see here.   nitroGLYCERIN 0.4 MG SL tablet Commonly known as: NITROSTAT Place 1 tablet (0.4 mg total) under the tongue every 5 (five) minutes x 3 doses as needed for chest pain.   pantoprazole 40 MG tablet Commonly known as: PROTONIX Take 1 tablet (40 mg total) by mouth at bedtime. What changed: when to take this   polyethylene glycol 17 g packet Commonly known as: MIRALAX / GLYCOLAX Take 17 g by mouth daily. What changed:  when to take this reasons to take this   potassium chloride 10 MEQ tablet Commonly known as: KLOR-CON Take 2 tablets (20 mEq total) by mouth daily.        Follow-up Information     [facility] [place] Follow up.   Specialty: [place] Why: Agency will call you to set up apt times Contact information: [address] [place] [address] [phone]                  Time coordinating discharge: 39 minutes  Signed:  Lonette Stevison  [facility] 12/26/2022, 11:30 AM

## 2022-12-26 NOTE — Plan of Care (Signed)
  Problem: Education: Goal: Knowledge of disease or condition will improve Outcome: Adequate for Discharge Goal: Knowledge of the prescribed therapeutic regimen will improve Outcome: Adequate for Discharge Goal: Individualized Educational Video(s) Outcome: Adequate for Discharge   Problem: Activity: Goal: Ability to tolerate increased activity will improve Outcome: Adequate for Discharge Goal: Will verbalize the importance of balancing activity with adequate rest periods Outcome: Adequate for Discharge   Problem: Respiratory: Goal: Ability to maintain a clear airway will improve Outcome: Adequate for Discharge Goal: Levels of oxygenation will improve Outcome: Adequate for Discharge Goal: Ability to maintain adequate ventilation will improve Outcome: Adequate for Discharge   Problem: Education: Goal: Knowledge of General Education information will improve Description: Including pain rating scale, medication(s)/side effects and non-pharmacologic comfort measures Outcome: Adequate for Discharge   Problem: Health Behavior/Discharge Planning: Goal: Ability to manage health-related needs will improve Outcome: Adequate for Discharge   Problem: Clinical Measurements: Goal: Ability to maintain clinical measurements within normal limits will improve Outcome: Adequate for Discharge Goal: Diagnostic test results will improve Outcome: Adequate for Discharge Goal: Respiratory complications will improve Outcome: Adequate for Discharge Goal: Cardiovascular complication will be avoided Outcome: Adequate for Discharge   Problem: Activity: Goal: Risk for activity intolerance will decrease Outcome: Adequate for Discharge   Problem: Coping: Goal: Level of anxiety will decrease Outcome: Adequate for Discharge   Problem: Elimination: Goal: Will not experience complications related to urinary retention Outcome: Adequate for Discharge   Problem: Safety: Goal: Ability to remain free from  injury will improve Outcome: Adequate for Discharge

## 2022-12-26 NOTE — TOC Transition Note (Signed)
Transition of Care [facility]) - CM/SW Discharge Note   Patient Details  Name: Sherry Fitzpatrick MRN: [id number] Date of Birth: 1951-03-31  [facility] CM/SW Contact:  Zenon Mayo, RN Phone Number: 12/26/2022, 11:35 AM   Clinical Narrative:    Patient is for dc today, NCM notified Claiborne Billings with [facility].  She has transport home.   Final next level of care: Home w Home Health Services Barriers to Discharge: Continued Medical Work up   Patient Goals and CMS Choice CMS Medicare.gov Compare Post Acute Care list provided to:: Patient Choice offered to / list presented to : Patient  Discharge Placement                         Discharge Plan and Services Additional resources added to the After Visit Summary for   In-house Referral: NA Discharge Planning Services: CM Consult Post Acute Care Choice: Home Health          DME Arranged: N/A DME Agency: NA       HH Arranged: PT HH Agency: [facility] Date [place]: 12/25/22 Time [place]: [id number] Representative spoke with at [place]: [place] Determinants of Health ([place]) Interventions SDOH Screenings   Tobacco Use: Low Risk  (12/15/2022)     Readmission Risk Interventions    12/18/2022    2:36 PM  Readmission Risk Prevention Plan  Transportation Screening Complete  PCP or Specialist Appt within 5-7 Days Complete  Home Care Screening Complete  Medication Review (RN CM) Complete

## 2023-01-02 DIAGNOSIS — D631 Anemia in chronic kidney disease: Secondary | ICD-10-CM | POA: Diagnosis not present

## 2023-01-02 DIAGNOSIS — G3184 Mild cognitive impairment, so stated: Secondary | ICD-10-CM | POA: Diagnosis not present

## 2023-01-02 DIAGNOSIS — I5022 Chronic systolic (congestive) heart failure: Secondary | ICD-10-CM | POA: Diagnosis not present

## 2023-01-02 DIAGNOSIS — I2510 Atherosclerotic heart disease of native coronary artery without angina pectoris: Secondary | ICD-10-CM | POA: Diagnosis not present

## 2023-01-02 DIAGNOSIS — G4733 Obstructive sleep apnea (adult) (pediatric): Secondary | ICD-10-CM | POA: Diagnosis not present

## 2023-01-02 DIAGNOSIS — H547 Unspecified visual loss: Secondary | ICD-10-CM | POA: Diagnosis not present

## 2023-01-02 DIAGNOSIS — N183 Chronic kidney disease, stage 3 unspecified: Secondary | ICD-10-CM | POA: Diagnosis not present

## 2023-01-02 DIAGNOSIS — Z955 Presence of coronary angioplasty implant and graft: Secondary | ICD-10-CM | POA: Diagnosis not present

## 2023-01-02 DIAGNOSIS — K219 Gastro-esophageal reflux disease without esophagitis: Secondary | ICD-10-CM | POA: Diagnosis not present

## 2023-01-02 DIAGNOSIS — E1165 Type 2 diabetes mellitus with hyperglycemia: Secondary | ICD-10-CM | POA: Diagnosis not present

## 2023-01-02 DIAGNOSIS — J441 Chronic obstructive pulmonary disease with (acute) exacerbation: Secondary | ICD-10-CM | POA: Diagnosis not present

## 2023-01-02 DIAGNOSIS — Z96641 Presence of right artificial hip joint: Secondary | ICD-10-CM | POA: Diagnosis not present

## 2023-01-02 DIAGNOSIS — I130 Hypertensive heart and chronic kidney disease with heart failure and stage 1 through stage 4 chronic kidney disease, or unspecified chronic kidney disease: Secondary | ICD-10-CM | POA: Diagnosis not present

## 2023-01-02 DIAGNOSIS — I252 Old myocardial infarction: Secondary | ICD-10-CM | POA: Diagnosis not present

## 2023-01-02 DIAGNOSIS — I480 Paroxysmal atrial fibrillation: Secondary | ICD-10-CM | POA: Diagnosis not present

## 2023-01-02 DIAGNOSIS — Z9181 History of falling: Secondary | ICD-10-CM | POA: Diagnosis not present

## 2023-01-02 DIAGNOSIS — E785 Hyperlipidemia, unspecified: Secondary | ICD-10-CM | POA: Diagnosis not present

## 2023-01-02 DIAGNOSIS — Z8673 Personal history of transient ischemic attack (TIA), and cerebral infarction without residual deficits: Secondary | ICD-10-CM | POA: Diagnosis not present

## 2023-01-02 DIAGNOSIS — Z556 Problems related to health literacy: Secondary | ICD-10-CM | POA: Diagnosis not present

## 2023-01-02 DIAGNOSIS — F32A Depression, unspecified: Secondary | ICD-10-CM | POA: Diagnosis not present

## 2023-01-02 DIAGNOSIS — Z604 Social exclusion and rejection: Secondary | ICD-10-CM | POA: Diagnosis not present

## 2023-01-03 DIAGNOSIS — I5031 Acute diastolic (congestive) heart failure: Secondary | ICD-10-CM | POA: Diagnosis not present

## 2023-01-03 DIAGNOSIS — E1159 Type 2 diabetes mellitus with other circulatory complications: Secondary | ICD-10-CM | POA: Diagnosis not present

## 2023-01-03 DIAGNOSIS — R7989 Other specified abnormal findings of blood chemistry: Secondary | ICD-10-CM | POA: Diagnosis not present

## 2023-01-03 DIAGNOSIS — I5032 Chronic diastolic (congestive) heart failure: Secondary | ICD-10-CM | POA: Diagnosis not present

## 2023-01-03 DIAGNOSIS — N1832 Chronic kidney disease, stage 3b: Secondary | ICD-10-CM | POA: Diagnosis not present

## 2023-01-03 DIAGNOSIS — E1165 Type 2 diabetes mellitus with hyperglycemia: Secondary | ICD-10-CM | POA: Diagnosis not present

## 2023-01-03 DIAGNOSIS — F32A Depression, unspecified: Secondary | ICD-10-CM | POA: Diagnosis not present

## 2023-01-03 DIAGNOSIS — E1122 Type 2 diabetes mellitus with diabetic chronic kidney disease: Secondary | ICD-10-CM | POA: Diagnosis not present

## 2023-01-03 DIAGNOSIS — J449 Chronic obstructive pulmonary disease, unspecified: Secondary | ICD-10-CM | POA: Diagnosis not present

## 2023-01-03 DIAGNOSIS — I209 Angina pectoris, unspecified: Secondary | ICD-10-CM | POA: Diagnosis not present

## 2023-01-09 DIAGNOSIS — Z9181 History of falling: Secondary | ICD-10-CM | POA: Diagnosis not present

## 2023-01-09 DIAGNOSIS — D631 Anemia in chronic kidney disease: Secondary | ICD-10-CM | POA: Diagnosis not present

## 2023-01-09 DIAGNOSIS — Z8673 Personal history of transient ischemic attack (TIA), and cerebral infarction without residual deficits: Secondary | ICD-10-CM | POA: Diagnosis not present

## 2023-01-09 DIAGNOSIS — K219 Gastro-esophageal reflux disease without esophagitis: Secondary | ICD-10-CM | POA: Diagnosis not present

## 2023-01-09 DIAGNOSIS — Z556 Problems related to health literacy: Secondary | ICD-10-CM | POA: Diagnosis not present

## 2023-01-09 DIAGNOSIS — E1165 Type 2 diabetes mellitus with hyperglycemia: Secondary | ICD-10-CM | POA: Diagnosis not present

## 2023-01-09 DIAGNOSIS — I252 Old myocardial infarction: Secondary | ICD-10-CM | POA: Diagnosis not present

## 2023-01-09 DIAGNOSIS — N183 Chronic kidney disease, stage 3 unspecified: Secondary | ICD-10-CM | POA: Diagnosis not present

## 2023-01-09 DIAGNOSIS — I5022 Chronic systolic (congestive) heart failure: Secondary | ICD-10-CM | POA: Diagnosis not present

## 2023-01-09 DIAGNOSIS — I2510 Atherosclerotic heart disease of native coronary artery without angina pectoris: Secondary | ICD-10-CM | POA: Diagnosis not present

## 2023-01-09 DIAGNOSIS — G4733 Obstructive sleep apnea (adult) (pediatric): Secondary | ICD-10-CM | POA: Diagnosis not present

## 2023-01-09 DIAGNOSIS — F32A Depression, unspecified: Secondary | ICD-10-CM | POA: Diagnosis not present

## 2023-01-09 DIAGNOSIS — H547 Unspecified visual loss: Secondary | ICD-10-CM | POA: Diagnosis not present

## 2023-01-09 DIAGNOSIS — G3184 Mild cognitive impairment, so stated: Secondary | ICD-10-CM | POA: Diagnosis not present

## 2023-01-09 DIAGNOSIS — Z604 Social exclusion and rejection: Secondary | ICD-10-CM | POA: Diagnosis not present

## 2023-01-09 DIAGNOSIS — Z96641 Presence of right artificial hip joint: Secondary | ICD-10-CM | POA: Diagnosis not present

## 2023-01-09 DIAGNOSIS — I480 Paroxysmal atrial fibrillation: Secondary | ICD-10-CM | POA: Diagnosis not present

## 2023-01-09 DIAGNOSIS — J441 Chronic obstructive pulmonary disease with (acute) exacerbation: Secondary | ICD-10-CM | POA: Diagnosis not present

## 2023-01-09 DIAGNOSIS — E785 Hyperlipidemia, unspecified: Secondary | ICD-10-CM | POA: Diagnosis not present

## 2023-01-09 DIAGNOSIS — I130 Hypertensive heart and chronic kidney disease with heart failure and stage 1 through stage 4 chronic kidney disease, or unspecified chronic kidney disease: Secondary | ICD-10-CM | POA: Diagnosis not present

## 2023-01-09 DIAGNOSIS — Z955 Presence of coronary angioplasty implant and graft: Secondary | ICD-10-CM | POA: Diagnosis not present

## 2023-01-15 DIAGNOSIS — I5032 Chronic diastolic (congestive) heart failure: Secondary | ICD-10-CM | POA: Diagnosis not present

## 2023-01-15 DIAGNOSIS — Z79899 Other long term (current) drug therapy: Secondary | ICD-10-CM | POA: Diagnosis not present

## 2023-01-15 DIAGNOSIS — I10 Essential (primary) hypertension: Secondary | ICD-10-CM | POA: Diagnosis not present

## 2023-01-15 DIAGNOSIS — E1122 Type 2 diabetes mellitus with diabetic chronic kidney disease: Secondary | ICD-10-CM | POA: Diagnosis not present

## 2023-01-15 DIAGNOSIS — I209 Angina pectoris, unspecified: Secondary | ICD-10-CM | POA: Diagnosis not present

## 2023-01-15 DIAGNOSIS — N1832 Chronic kidney disease, stage 3b: Secondary | ICD-10-CM | POA: Diagnosis not present

## 2023-01-15 DIAGNOSIS — E1165 Type 2 diabetes mellitus with hyperglycemia: Secondary | ICD-10-CM | POA: Diagnosis not present

## 2023-01-15 DIAGNOSIS — I25118 Atherosclerotic heart disease of native coronary artery with other forms of angina pectoris: Secondary | ICD-10-CM | POA: Diagnosis not present

## 2023-01-15 DIAGNOSIS — E782 Mixed hyperlipidemia: Secondary | ICD-10-CM | POA: Diagnosis not present

## 2023-01-16 DIAGNOSIS — G3184 Mild cognitive impairment, so stated: Secondary | ICD-10-CM | POA: Diagnosis not present

## 2023-01-16 DIAGNOSIS — I5022 Chronic systolic (congestive) heart failure: Secondary | ICD-10-CM | POA: Diagnosis not present

## 2023-01-16 DIAGNOSIS — Z604 Social exclusion and rejection: Secondary | ICD-10-CM | POA: Diagnosis not present

## 2023-01-16 DIAGNOSIS — Z8673 Personal history of transient ischemic attack (TIA), and cerebral infarction without residual deficits: Secondary | ICD-10-CM | POA: Diagnosis not present

## 2023-01-16 DIAGNOSIS — I2510 Atherosclerotic heart disease of native coronary artery without angina pectoris: Secondary | ICD-10-CM | POA: Diagnosis not present

## 2023-01-16 DIAGNOSIS — Z955 Presence of coronary angioplasty implant and graft: Secondary | ICD-10-CM | POA: Diagnosis not present

## 2023-01-16 DIAGNOSIS — I480 Paroxysmal atrial fibrillation: Secondary | ICD-10-CM | POA: Diagnosis not present

## 2023-01-16 DIAGNOSIS — D631 Anemia in chronic kidney disease: Secondary | ICD-10-CM | POA: Diagnosis not present

## 2023-01-16 DIAGNOSIS — I252 Old myocardial infarction: Secondary | ICD-10-CM | POA: Diagnosis not present

## 2023-01-16 DIAGNOSIS — Z96641 Presence of right artificial hip joint: Secondary | ICD-10-CM | POA: Diagnosis not present

## 2023-01-16 DIAGNOSIS — G4733 Obstructive sleep apnea (adult) (pediatric): Secondary | ICD-10-CM | POA: Diagnosis not present

## 2023-01-16 DIAGNOSIS — Z9181 History of falling: Secondary | ICD-10-CM | POA: Diagnosis not present

## 2023-01-16 DIAGNOSIS — H547 Unspecified visual loss: Secondary | ICD-10-CM | POA: Diagnosis not present

## 2023-01-16 DIAGNOSIS — J441 Chronic obstructive pulmonary disease with (acute) exacerbation: Secondary | ICD-10-CM | POA: Diagnosis not present

## 2023-01-16 DIAGNOSIS — Z556 Problems related to health literacy: Secondary | ICD-10-CM | POA: Diagnosis not present

## 2023-01-16 DIAGNOSIS — F32A Depression, unspecified: Secondary | ICD-10-CM | POA: Diagnosis not present

## 2023-01-16 DIAGNOSIS — N183 Chronic kidney disease, stage 3 unspecified: Secondary | ICD-10-CM | POA: Diagnosis not present

## 2023-01-16 DIAGNOSIS — I130 Hypertensive heart and chronic kidney disease with heart failure and stage 1 through stage 4 chronic kidney disease, or unspecified chronic kidney disease: Secondary | ICD-10-CM | POA: Diagnosis not present

## 2023-01-16 DIAGNOSIS — E1165 Type 2 diabetes mellitus with hyperglycemia: Secondary | ICD-10-CM | POA: Diagnosis not present

## 2023-01-16 DIAGNOSIS — K219 Gastro-esophageal reflux disease without esophagitis: Secondary | ICD-10-CM | POA: Diagnosis not present

## 2023-01-16 DIAGNOSIS — E785 Hyperlipidemia, unspecified: Secondary | ICD-10-CM | POA: Diagnosis not present

## 2023-01-18 DIAGNOSIS — N1832 Chronic kidney disease, stage 3b: Secondary | ICD-10-CM | POA: Diagnosis not present

## 2023-01-18 DIAGNOSIS — E1165 Type 2 diabetes mellitus with hyperglycemia: Secondary | ICD-10-CM | POA: Diagnosis not present

## 2023-01-18 DIAGNOSIS — E7800 Pure hypercholesterolemia, unspecified: Secondary | ICD-10-CM | POA: Diagnosis not present

## 2023-01-18 DIAGNOSIS — J449 Chronic obstructive pulmonary disease, unspecified: Secondary | ICD-10-CM | POA: Diagnosis not present

## 2023-01-18 DIAGNOSIS — I5032 Chronic diastolic (congestive) heart failure: Secondary | ICD-10-CM | POA: Diagnosis not present

## 2023-01-18 DIAGNOSIS — I10 Essential (primary) hypertension: Secondary | ICD-10-CM | POA: Diagnosis not present

## 2023-01-24 DIAGNOSIS — G4733 Obstructive sleep apnea (adult) (pediatric): Secondary | ICD-10-CM | POA: Diagnosis not present

## 2023-01-24 DIAGNOSIS — Z8673 Personal history of transient ischemic attack (TIA), and cerebral infarction without residual deficits: Secondary | ICD-10-CM | POA: Diagnosis not present

## 2023-01-24 DIAGNOSIS — I130 Hypertensive heart and chronic kidney disease with heart failure and stage 1 through stage 4 chronic kidney disease, or unspecified chronic kidney disease: Secondary | ICD-10-CM | POA: Diagnosis not present

## 2023-01-24 DIAGNOSIS — F32A Depression, unspecified: Secondary | ICD-10-CM | POA: Diagnosis not present

## 2023-01-24 DIAGNOSIS — Z556 Problems related to health literacy: Secondary | ICD-10-CM | POA: Diagnosis not present

## 2023-01-24 DIAGNOSIS — Z604 Social exclusion and rejection: Secondary | ICD-10-CM | POA: Diagnosis not present

## 2023-01-24 DIAGNOSIS — I5022 Chronic systolic (congestive) heart failure: Secondary | ICD-10-CM | POA: Diagnosis not present

## 2023-01-24 DIAGNOSIS — K219 Gastro-esophageal reflux disease without esophagitis: Secondary | ICD-10-CM | POA: Diagnosis not present

## 2023-01-24 DIAGNOSIS — I480 Paroxysmal atrial fibrillation: Secondary | ICD-10-CM | POA: Diagnosis not present

## 2023-01-24 DIAGNOSIS — E1165 Type 2 diabetes mellitus with hyperglycemia: Secondary | ICD-10-CM | POA: Diagnosis not present

## 2023-01-24 DIAGNOSIS — Z9181 History of falling: Secondary | ICD-10-CM | POA: Diagnosis not present

## 2023-01-24 DIAGNOSIS — Z96641 Presence of right artificial hip joint: Secondary | ICD-10-CM | POA: Diagnosis not present

## 2023-01-24 DIAGNOSIS — J441 Chronic obstructive pulmonary disease with (acute) exacerbation: Secondary | ICD-10-CM | POA: Diagnosis not present

## 2023-01-24 DIAGNOSIS — I252 Old myocardial infarction: Secondary | ICD-10-CM | POA: Diagnosis not present

## 2023-01-24 DIAGNOSIS — G3184 Mild cognitive impairment, so stated: Secondary | ICD-10-CM | POA: Diagnosis not present

## 2023-01-24 DIAGNOSIS — E785 Hyperlipidemia, unspecified: Secondary | ICD-10-CM | POA: Diagnosis not present

## 2023-01-24 DIAGNOSIS — N183 Chronic kidney disease, stage 3 unspecified: Secondary | ICD-10-CM | POA: Diagnosis not present

## 2023-01-24 DIAGNOSIS — I2510 Atherosclerotic heart disease of native coronary artery without angina pectoris: Secondary | ICD-10-CM | POA: Diagnosis not present

## 2023-01-24 DIAGNOSIS — D631 Anemia in chronic kidney disease: Secondary | ICD-10-CM | POA: Diagnosis not present

## 2023-01-24 DIAGNOSIS — Z955 Presence of coronary angioplasty implant and graft: Secondary | ICD-10-CM | POA: Diagnosis not present

## 2023-01-24 DIAGNOSIS — H547 Unspecified visual loss: Secondary | ICD-10-CM | POA: Diagnosis not present

## 2023-02-13 DIAGNOSIS — I252 Old myocardial infarction: Secondary | ICD-10-CM | POA: Diagnosis not present

## 2023-02-13 DIAGNOSIS — I480 Paroxysmal atrial fibrillation: Secondary | ICD-10-CM | POA: Diagnosis not present

## 2023-02-13 DIAGNOSIS — I5022 Chronic systolic (congestive) heart failure: Secondary | ICD-10-CM | POA: Diagnosis not present

## 2023-02-13 DIAGNOSIS — Z8673 Personal history of transient ischemic attack (TIA), and cerebral infarction without residual deficits: Secondary | ICD-10-CM | POA: Diagnosis not present

## 2023-02-13 DIAGNOSIS — I130 Hypertensive heart and chronic kidney disease with heart failure and stage 1 through stage 4 chronic kidney disease, or unspecified chronic kidney disease: Secondary | ICD-10-CM | POA: Diagnosis not present

## 2023-02-13 DIAGNOSIS — Z556 Problems related to health literacy: Secondary | ICD-10-CM | POA: Diagnosis not present

## 2023-02-13 DIAGNOSIS — I2510 Atherosclerotic heart disease of native coronary artery without angina pectoris: Secondary | ICD-10-CM | POA: Diagnosis not present

## 2023-02-13 DIAGNOSIS — N183 Chronic kidney disease, stage 3 unspecified: Secondary | ICD-10-CM | POA: Diagnosis not present

## 2023-02-13 DIAGNOSIS — Z955 Presence of coronary angioplasty implant and graft: Secondary | ICD-10-CM | POA: Diagnosis not present

## 2023-02-13 DIAGNOSIS — G4733 Obstructive sleep apnea (adult) (pediatric): Secondary | ICD-10-CM | POA: Diagnosis not present

## 2023-02-13 DIAGNOSIS — F32A Depression, unspecified: Secondary | ICD-10-CM | POA: Diagnosis not present

## 2023-02-13 DIAGNOSIS — E785 Hyperlipidemia, unspecified: Secondary | ICD-10-CM | POA: Diagnosis not present

## 2023-02-13 DIAGNOSIS — H547 Unspecified visual loss: Secondary | ICD-10-CM | POA: Diagnosis not present

## 2023-02-13 DIAGNOSIS — Z96641 Presence of right artificial hip joint: Secondary | ICD-10-CM | POA: Diagnosis not present

## 2023-02-13 DIAGNOSIS — G3184 Mild cognitive impairment, so stated: Secondary | ICD-10-CM | POA: Diagnosis not present

## 2023-02-13 DIAGNOSIS — Z9181 History of falling: Secondary | ICD-10-CM | POA: Diagnosis not present

## 2023-02-13 DIAGNOSIS — E1165 Type 2 diabetes mellitus with hyperglycemia: Secondary | ICD-10-CM | POA: Diagnosis not present

## 2023-02-13 DIAGNOSIS — K219 Gastro-esophageal reflux disease without esophagitis: Secondary | ICD-10-CM | POA: Diagnosis not present

## 2023-02-13 DIAGNOSIS — J441 Chronic obstructive pulmonary disease with (acute) exacerbation: Secondary | ICD-10-CM | POA: Diagnosis not present

## 2023-02-13 DIAGNOSIS — Z604 Social exclusion and rejection: Secondary | ICD-10-CM | POA: Diagnosis not present

## 2023-02-13 DIAGNOSIS — D631 Anemia in chronic kidney disease: Secondary | ICD-10-CM | POA: Diagnosis not present

## 2023-02-26 DIAGNOSIS — R0789 Other chest pain: Secondary | ICD-10-CM | POA: Diagnosis not present

## 2023-02-26 DIAGNOSIS — I10 Essential (primary) hypertension: Secondary | ICD-10-CM | POA: Diagnosis not present

## 2023-02-26 DIAGNOSIS — I25118 Atherosclerotic heart disease of native coronary artery with other forms of angina pectoris: Secondary | ICD-10-CM | POA: Diagnosis not present

## 2023-02-26 DIAGNOSIS — I5032 Chronic diastolic (congestive) heart failure: Secondary | ICD-10-CM | POA: Diagnosis not present

## 2023-02-27 DIAGNOSIS — H524 Presbyopia: Secondary | ICD-10-CM | POA: Diagnosis not present

## 2023-03-13 DIAGNOSIS — E1165 Type 2 diabetes mellitus with hyperglycemia: Secondary | ICD-10-CM | POA: Diagnosis not present

## 2023-03-13 DIAGNOSIS — E7800 Pure hypercholesterolemia, unspecified: Secondary | ICD-10-CM | POA: Diagnosis not present

## 2023-03-13 DIAGNOSIS — I5032 Chronic diastolic (congestive) heart failure: Secondary | ICD-10-CM | POA: Diagnosis not present

## 2023-03-13 DIAGNOSIS — I10 Essential (primary) hypertension: Secondary | ICD-10-CM | POA: Diagnosis not present

## 2023-03-13 DIAGNOSIS — J449 Chronic obstructive pulmonary disease, unspecified: Secondary | ICD-10-CM | POA: Diagnosis not present

## 2023-03-13 DIAGNOSIS — N1832 Chronic kidney disease, stage 3b: Secondary | ICD-10-CM | POA: Diagnosis not present

## 2023-03-25 DIAGNOSIS — R1031 Right lower quadrant pain: Secondary | ICD-10-CM | POA: Diagnosis not present

## 2023-03-25 DIAGNOSIS — R1032 Left lower quadrant pain: Secondary | ICD-10-CM | POA: Diagnosis not present

## 2023-04-10 DIAGNOSIS — N1832 Chronic kidney disease, stage 3b: Secondary | ICD-10-CM | POA: Diagnosis not present

## 2023-04-10 DIAGNOSIS — E7800 Pure hypercholesterolemia, unspecified: Secondary | ICD-10-CM | POA: Diagnosis not present

## 2023-04-10 DIAGNOSIS — I10 Essential (primary) hypertension: Secondary | ICD-10-CM | POA: Diagnosis not present

## 2023-04-10 DIAGNOSIS — I5032 Chronic diastolic (congestive) heart failure: Secondary | ICD-10-CM | POA: Diagnosis not present

## 2023-04-10 DIAGNOSIS — J449 Chronic obstructive pulmonary disease, unspecified: Secondary | ICD-10-CM | POA: Diagnosis not present

## 2023-04-10 DIAGNOSIS — E1165 Type 2 diabetes mellitus with hyperglycemia: Secondary | ICD-10-CM | POA: Diagnosis not present

## 2023-04-19 DIAGNOSIS — I10 Essential (primary) hypertension: Secondary | ICD-10-CM | POA: Diagnosis not present

## 2023-04-19 DIAGNOSIS — E1165 Type 2 diabetes mellitus with hyperglycemia: Secondary | ICD-10-CM | POA: Diagnosis not present

## 2023-04-19 DIAGNOSIS — R079 Chest pain, unspecified: Secondary | ICD-10-CM | POA: Diagnosis not present

## 2023-07-01 ENCOUNTER — Inpatient Hospital Stay (HOSPITAL_COMMUNITY)
Admission: EM | Admit: 2023-07-01 | Discharge: 2023-07-09 | DRG: 190 | Disposition: A | Discharge status: Home Health | Payer: 59 | Attending: Internal Medicine | Admitting: Internal Medicine

## 2023-07-01 ENCOUNTER — Emergency Department (HOSPITAL_COMMUNITY): Payer: 59

## 2023-07-01 ENCOUNTER — Encounter (HOSPITAL_COMMUNITY): Payer: Self-pay

## 2023-07-01 ENCOUNTER — Other Ambulatory Visit: Payer: Self-pay

## 2023-07-01 DIAGNOSIS — Z8673 Personal history of transient ischemic attack (TIA), and cerebral infarction without residual deficits: Secondary | ICD-10-CM | POA: Diagnosis not present

## 2023-07-01 DIAGNOSIS — E1122 Type 2 diabetes mellitus with diabetic chronic kidney disease: Secondary | ICD-10-CM | POA: Diagnosis present

## 2023-07-01 DIAGNOSIS — Z1152 Encounter for screening for COVID-19: Secondary | ICD-10-CM

## 2023-07-01 DIAGNOSIS — E669 Obesity, unspecified: Secondary | ICD-10-CM | POA: Diagnosis not present

## 2023-07-01 DIAGNOSIS — Z6841 Body Mass Index (BMI) 40.0 and over, adult: Secondary | ICD-10-CM | POA: Diagnosis not present

## 2023-07-01 DIAGNOSIS — J9601 Acute respiratory failure with hypoxia: Secondary | ICD-10-CM | POA: Diagnosis not present

## 2023-07-01 DIAGNOSIS — Z880 Allergy status to penicillin: Secondary | ICD-10-CM

## 2023-07-01 DIAGNOSIS — I493 Ventricular premature depolarization: Secondary | ICD-10-CM | POA: Diagnosis present

## 2023-07-01 DIAGNOSIS — R0602 Shortness of breath: Secondary | ICD-10-CM | POA: Diagnosis not present

## 2023-07-01 DIAGNOSIS — Z7951 Long term (current) use of inhaled steroids: Secondary | ICD-10-CM

## 2023-07-01 DIAGNOSIS — J449 Chronic obstructive pulmonary disease, unspecified: Secondary | ICD-10-CM | POA: Diagnosis not present

## 2023-07-01 DIAGNOSIS — I5030 Unspecified diastolic (congestive) heart failure: Secondary | ICD-10-CM | POA: Diagnosis present

## 2023-07-01 DIAGNOSIS — E785 Hyperlipidemia, unspecified: Secondary | ICD-10-CM | POA: Diagnosis present

## 2023-07-01 DIAGNOSIS — R000 Tachycardia, unspecified: Secondary | ICD-10-CM | POA: Diagnosis not present

## 2023-07-01 DIAGNOSIS — Z91041 Radiographic dye allergy status: Secondary | ICD-10-CM

## 2023-07-01 DIAGNOSIS — I480 Paroxysmal atrial fibrillation: Secondary | ICD-10-CM | POA: Diagnosis not present

## 2023-07-01 DIAGNOSIS — N281 Cyst of kidney, acquired: Secondary | ICD-10-CM | POA: Diagnosis not present

## 2023-07-01 DIAGNOSIS — D649 Anemia, unspecified: Secondary | ICD-10-CM | POA: Diagnosis not present

## 2023-07-01 DIAGNOSIS — Z8249 Family history of ischemic heart disease and other diseases of the circulatory system: Secondary | ICD-10-CM

## 2023-07-01 DIAGNOSIS — K219 Gastro-esophageal reflux disease without esophagitis: Secondary | ICD-10-CM | POA: Diagnosis present

## 2023-07-01 DIAGNOSIS — Z885 Allergy status to narcotic agent status: Secondary | ICD-10-CM | POA: Diagnosis not present

## 2023-07-01 DIAGNOSIS — R3129 Other microscopic hematuria: Secondary | ICD-10-CM | POA: Diagnosis not present

## 2023-07-01 DIAGNOSIS — Z79899 Other long term (current) drug therapy: Secondary | ICD-10-CM

## 2023-07-01 DIAGNOSIS — N179 Acute kidney failure, unspecified: Secondary | ICD-10-CM | POA: Diagnosis not present

## 2023-07-01 DIAGNOSIS — R0902 Hypoxemia: Principal | ICD-10-CM

## 2023-07-01 DIAGNOSIS — Z8669 Personal history of other diseases of the nervous system and sense organs: Secondary | ICD-10-CM | POA: Diagnosis not present

## 2023-07-01 DIAGNOSIS — I5033 Acute on chronic diastolic (congestive) heart failure: Secondary | ICD-10-CM | POA: Diagnosis present

## 2023-07-01 DIAGNOSIS — J441 Chronic obstructive pulmonary disease with (acute) exacerbation: Principal | ICD-10-CM

## 2023-07-01 DIAGNOSIS — I700 Atherosclerosis of aorta: Secondary | ICD-10-CM | POA: Diagnosis not present

## 2023-07-01 DIAGNOSIS — Z96641 Presence of right artificial hip joint: Secondary | ICD-10-CM | POA: Diagnosis present

## 2023-07-01 DIAGNOSIS — D631 Anemia in chronic kidney disease: Secondary | ICD-10-CM | POA: Diagnosis not present

## 2023-07-01 DIAGNOSIS — Z7902 Long term (current) use of antithrombotics/antiplatelets: Secondary | ICD-10-CM

## 2023-07-01 DIAGNOSIS — E1165 Type 2 diabetes mellitus with hyperglycemia: Secondary | ICD-10-CM

## 2023-07-01 DIAGNOSIS — Z888 Allergy status to other drugs, medicaments and biological substances status: Secondary | ICD-10-CM | POA: Diagnosis not present

## 2023-07-01 DIAGNOSIS — R0600 Dyspnea, unspecified: Secondary | ICD-10-CM | POA: Diagnosis not present

## 2023-07-01 DIAGNOSIS — I130 Hypertensive heart and chronic kidney disease with heart failure and stage 1 through stage 4 chronic kidney disease, or unspecified chronic kidney disease: Secondary | ICD-10-CM | POA: Diagnosis present

## 2023-07-01 DIAGNOSIS — Z8679 Personal history of other diseases of the circulatory system: Secondary | ICD-10-CM

## 2023-07-01 DIAGNOSIS — R0989 Other specified symptoms and signs involving the circulatory and respiratory systems: Secondary | ICD-10-CM | POA: Diagnosis not present

## 2023-07-01 DIAGNOSIS — N1832 Chronic kidney disease, stage 3b: Secondary | ICD-10-CM | POA: Diagnosis not present

## 2023-07-01 DIAGNOSIS — Z794 Long term (current) use of insulin: Secondary | ICD-10-CM

## 2023-07-01 DIAGNOSIS — I517 Cardiomegaly: Secondary | ICD-10-CM | POA: Diagnosis not present

## 2023-07-01 DIAGNOSIS — I10 Essential (primary) hypertension: Secondary | ICD-10-CM | POA: Diagnosis not present

## 2023-07-01 DIAGNOSIS — Z23 Encounter for immunization: Secondary | ICD-10-CM | POA: Diagnosis not present

## 2023-07-01 DIAGNOSIS — I252 Old myocardial infarction: Secondary | ICD-10-CM

## 2023-07-01 DIAGNOSIS — I5032 Chronic diastolic (congestive) heart failure: Secondary | ICD-10-CM

## 2023-07-01 DIAGNOSIS — Z7982 Long term (current) use of aspirin: Secondary | ICD-10-CM | POA: Diagnosis not present

## 2023-07-01 DIAGNOSIS — Z955 Presence of coronary angioplasty implant and graft: Secondary | ICD-10-CM

## 2023-07-01 DIAGNOSIS — Z8639 Personal history of other endocrine, nutritional and metabolic disease: Secondary | ICD-10-CM | POA: Diagnosis not present

## 2023-07-01 DIAGNOSIS — R059 Cough, unspecified: Secondary | ICD-10-CM | POA: Diagnosis not present

## 2023-07-01 DIAGNOSIS — J069 Acute upper respiratory infection, unspecified: Secondary | ICD-10-CM

## 2023-07-01 DIAGNOSIS — I425 Other restrictive cardiomyopathy: Secondary | ICD-10-CM | POA: Diagnosis not present

## 2023-07-01 DIAGNOSIS — Z833 Family history of diabetes mellitus: Secondary | ICD-10-CM

## 2023-07-01 DIAGNOSIS — I2510 Atherosclerotic heart disease of native coronary artery without angina pectoris: Secondary | ICD-10-CM | POA: Diagnosis present

## 2023-07-01 LAB — CBC
HCT: 37.4 % (ref 36.0–46.0)
Hemoglobin: 11.7 g/dL — ABNORMAL LOW (ref 12.0–15.0)
MCH: 28.4 pg (ref 26.0–34.0)
MCHC: 31.3 g/dL (ref 30.0–36.0)
MCV: 90.8 fL (ref 80.0–100.0)
Platelets: 325 10*3/uL (ref 150–400)
RBC: 4.12 MIL/uL (ref 3.87–5.11)
RDW: 15.4 % (ref 11.5–15.5)
WBC: 13.3 10*3/uL — ABNORMAL HIGH (ref 4.0–10.5)
nRBC: 0 % (ref 0.0–0.2)

## 2023-07-01 LAB — RESP PANEL BY RT-PCR (RSV, FLU A&B, COVID)  RVPGX2
Influenza A by PCR: NEGATIVE
Influenza B by PCR: NEGATIVE
Resp Syncytial Virus by PCR: NEGATIVE
SARS Coronavirus 2 by RT PCR: NEGATIVE

## 2023-07-01 LAB — I-STAT ARTERIAL BLOOD GAS, ED
Acid-Base Excess: 3 mmol/L — ABNORMAL HIGH (ref 0.0–2.0)
Bicarbonate: 31.2 mmol/L — ABNORMAL HIGH (ref 20.0–28.0)
Calcium, Ion: 1.16 mmol/L (ref 1.15–1.40)
HCT: 36 % (ref 36.0–46.0)
Hemoglobin: 12.2 g/dL (ref 12.0–15.0)
O2 Saturation: 98 %
Patient temperature: 98.3
Potassium: 4.3 mmol/L (ref 3.5–5.1)
Sodium: 138 mmol/L (ref 135–145)
TCO2: 33 mmol/L — ABNORMAL HIGH (ref 22–32)
pCO2 arterial: 60.9 mmHg — ABNORMAL HIGH (ref 32–48)
pH, Arterial: 7.316 — ABNORMAL LOW (ref 7.35–7.45)
pO2, Arterial: 116 mmHg — ABNORMAL HIGH (ref 83–108)

## 2023-07-01 LAB — BASIC METABOLIC PANEL
Anion gap: 10 (ref 5–15)
BUN: 12 mg/dL (ref 8–23)
CO2: 28 mmol/L (ref 22–32)
Calcium: 8.3 mg/dL — ABNORMAL LOW (ref 8.9–10.3)
Chloride: 99 mmol/L (ref 98–111)
Creatinine, Ser: 1.21 mg/dL — ABNORMAL HIGH (ref 0.44–1.00)
GFR, Estimated: 47 mL/min — ABNORMAL LOW (ref >60)
Glucose, Bld: 143 mg/dL — ABNORMAL HIGH (ref 70–99)
Potassium: 3.7 mmol/L (ref 3.5–5.1)
Sodium: 137 mmol/L (ref 135–145)

## 2023-07-01 LAB — BRAIN NATRIURETIC PEPTIDE: B Natriuretic Peptide: 170.6 pg/mL — ABNORMAL HIGH (ref 0.0–100.0)

## 2023-07-01 MED ORDER — SODIUM CHLORIDE 0.9% FLUSH
3.0000 mL | Freq: Two times a day (BID) | INTRAVENOUS | Status: DC
  Administered 2023-07-02 – 2023-07-09 (×15): 3 mL via INTRAVENOUS

## 2023-07-01 MED ORDER — ATORVASTATIN CALCIUM 40 MG PO TABS
40.0000 mg | ORAL_TABLET | Freq: Every day | ORAL | Status: DC
  Administered 2023-07-02 – 2023-07-09 (×8): 40 mg via ORAL
  Filled 2023-07-01 (×8): qty 1

## 2023-07-01 MED ORDER — SODIUM CHLORIDE 0.9% FLUSH
3.0000 mL | INTRAVENOUS | Status: DC | PRN
  Administered 2023-07-04: 3 mL via INTRAVENOUS

## 2023-07-01 MED ORDER — PANTOPRAZOLE SODIUM 40 MG PO TBEC
40.0000 mg | DELAYED_RELEASE_TABLET | Freq: Every day | ORAL | Status: DC
  Administered 2023-07-02 – 2023-07-09 (×8): 40 mg via ORAL
  Filled 2023-07-01 (×8): qty 1

## 2023-07-01 MED ORDER — AZITHROMYCIN 500 MG PO TABS
500.0000 mg | ORAL_TABLET | Freq: Every day | ORAL | Status: AC
  Administered 2023-07-02 – 2023-07-05 (×4): 500 mg via ORAL
  Filled 2023-07-01 (×4): qty 1

## 2023-07-01 MED ORDER — SENNOSIDES-DOCUSATE SODIUM 8.6-50 MG PO TABS: 1.0000 | ORAL_TABLET | Freq: Every evening | ORAL | Status: DC | PRN

## 2023-07-01 MED ORDER — ISOSORBIDE MONONITRATE ER 60 MG PO TB24
120.0000 mg | ORAL_TABLET | Freq: Every day | ORAL | Status: DC
  Administered 2023-07-02 – 2023-07-04 (×3): 120 mg via ORAL
  Filled 2023-07-01: qty 4
  Filled 2023-07-01 (×2): qty 2

## 2023-07-01 MED ORDER — INSULIN ASPART 100 UNIT/ML IJ SOLN
0.0000 [IU] | Freq: Three times a day (TID) | INTRAMUSCULAR | Status: DC
  Administered 2023-07-02 – 2023-07-07 (×11): 1 [IU] via SUBCUTANEOUS
  Administered 2023-07-08: 2 [IU] via SUBCUTANEOUS

## 2023-07-01 MED ORDER — ONDANSETRON HCL 4 MG PO TABS
4.0000 mg | ORAL_TABLET | Freq: Four times a day (QID) | ORAL | Status: DC | PRN
  Filled 2023-07-01: qty 1

## 2023-07-01 MED ORDER — GUAIFENESIN ER 600 MG PO TB12
600.0000 mg | ORAL_TABLET | Freq: Two times a day (BID) | ORAL | Status: AC
  Administered 2023-07-02 – 2023-07-04 (×7): 600 mg via ORAL
  Filled 2023-07-01 (×7): qty 1

## 2023-07-01 MED ORDER — CLOPIDOGREL BISULFATE 75 MG PO TABS
75.0000 mg | ORAL_TABLET | Freq: Every day | ORAL | Status: DC
  Administered 2023-07-02 – 2023-07-09 (×8): 75 mg via ORAL
  Filled 2023-07-01 (×8): qty 1

## 2023-07-01 MED ORDER — ALBUTEROL SULFATE HFA 108 (90 BASE) MCG/ACT IN AERS: 2.0000 | INHALATION_SPRAY | RESPIRATORY_TRACT | Status: DC | PRN

## 2023-07-01 MED ORDER — POLYETHYLENE GLYCOL 3350 17 G PO PACK: 17.0000 g | PACK | Freq: Every day | ORAL | Status: DC | PRN

## 2023-07-01 MED ORDER — INSULIN ASPART 100 UNIT/ML IJ SOLN: 0.0000 [IU] | Freq: Every day | INTRAMUSCULAR | Status: DC

## 2023-07-01 MED ORDER — IRBESARTAN 300 MG PO TABS
300.0000 mg | ORAL_TABLET | Freq: Every day | ORAL | Status: DC
  Administered 2023-07-02: 300 mg via ORAL
  Filled 2023-07-01: qty 1

## 2023-07-01 MED ORDER — SODIUM CHLORIDE 0.9 % IV SOLN
500.0000 mg | INTRAVENOUS | Status: AC
  Administered 2023-07-02: 500 mg via INTRAVENOUS
  Filled 2023-07-01: qty 5

## 2023-07-01 MED ORDER — AMIODARONE HCL 200 MG PO TABS
100.0000 mg | ORAL_TABLET | Freq: Every day | ORAL | Status: DC
  Administered 2023-07-02 – 2023-07-09 (×8): 100 mg via ORAL
  Filled 2023-07-01 (×8): qty 1

## 2023-07-01 MED ORDER — LEVALBUTEROL HCL 0.63 MG/3ML IN NEBU
0.6300 mg | INHALATION_SOLUTION | Freq: Four times a day (QID) | RESPIRATORY_TRACT | Status: DC | PRN
  Administered 2023-07-03: 0.63 mg via RESPIRATORY_TRACT
  Filled 2023-07-01: qty 3

## 2023-07-01 MED ORDER — ASPIRIN 81 MG PO CHEW
81.0000 mg | CHEWABLE_TABLET | Freq: Every day | ORAL | Status: DC
  Administered 2023-07-02 – 2023-07-09 (×8): 81 mg via ORAL
  Filled 2023-07-01 (×8): qty 1

## 2023-07-01 MED ORDER — ONDANSETRON HCL 4 MG/2ML IJ SOLN
4.0000 mg | Freq: Four times a day (QID) | INTRAMUSCULAR | Status: DC | PRN
  Administered 2023-07-09: 4 mg via INTRAVENOUS
  Filled 2023-07-01: qty 2

## 2023-07-01 MED ORDER — POTASSIUM CHLORIDE CRYS ER 20 MEQ PO TBCR
20.0000 meq | EXTENDED_RELEASE_TABLET | Freq: Every day | ORAL | Status: DC
  Administered 2023-07-02 – 2023-07-04 (×3): 20 meq via ORAL
  Filled 2023-07-01 (×4): qty 1

## 2023-07-01 MED ORDER — ACETAMINOPHEN 650 MG RE SUPP: 650.0000 mg | Freq: Four times a day (QID) | RECTAL | Status: DC | PRN

## 2023-07-01 MED ORDER — FERROUS SULFATE 325 (65 FE) MG PO TABS
325.0000 mg | ORAL_TABLET | Freq: Two times a day (BID) | ORAL | Status: DC
  Administered 2023-07-02 – 2023-07-09 (×15): 325 mg via ORAL
  Filled 2023-07-01 (×15): qty 1

## 2023-07-01 MED ORDER — FLUTICASONE PROPIONATE 50 MCG/ACT NA SUSP
1.0000 | Freq: Every day | NASAL | Status: AC
  Administered 2023-07-02 – 2023-07-08 (×5): 1 via NASAL
  Filled 2023-07-01 (×3): qty 16

## 2023-07-01 MED ORDER — IPRATROPIUM-ALBUTEROL 0.5-2.5 (3) MG/3ML IN SOLN
3.0000 mL | Freq: Four times a day (QID) | RESPIRATORY_TRACT | Status: DC
  Administered 2023-07-02 – 2023-07-03 (×6): 3 mL via RESPIRATORY_TRACT
  Filled 2023-07-01 (×8): qty 3

## 2023-07-01 MED ORDER — AMLODIPINE BESYLATE 10 MG PO TABS
10.0000 mg | ORAL_TABLET | Freq: Every day | ORAL | Status: DC
  Administered 2023-07-02 – 2023-07-04 (×3): 10 mg via ORAL
  Filled 2023-07-01 (×2): qty 1
  Filled 2023-07-01: qty 2

## 2023-07-01 MED ORDER — FUROSEMIDE 20 MG PO TABS
40.0000 mg | ORAL_TABLET | Freq: Every day | ORAL | Status: DC
  Administered 2023-07-02: 40 mg via ORAL
  Filled 2023-07-01: qty 2

## 2023-07-01 MED ORDER — CARVEDILOL 6.25 MG PO TABS
6.2500 mg | ORAL_TABLET | Freq: Two times a day (BID) | ORAL | Status: DC
  Administered 2023-07-02 – 2023-07-04 (×6): 6.25 mg via ORAL
  Filled 2023-07-01 (×3): qty 1
  Filled 2023-07-01 (×2): qty 2
  Filled 2023-07-01: qty 1

## 2023-07-01 MED ORDER — SODIUM CHLORIDE 0.9 % IV SOLN: 250.0000 mL | INTRAVENOUS | Status: DC | PRN

## 2023-07-01 MED ORDER — METHYLPREDNISOLONE SODIUM SUCC 125 MG IJ SOLR
80.0000 mg | Freq: Every day | INTRAMUSCULAR | Status: AC
  Administered 2023-07-02 – 2023-07-04 (×4): 80 mg via INTRAVENOUS
  Filled 2023-07-01 (×4): qty 2

## 2023-07-01 MED ORDER — HEPARIN SODIUM (PORCINE) 5000 UNIT/ML IJ SOLN
5000.0000 [IU] | Freq: Three times a day (TID) | INTRAMUSCULAR | Status: DC
  Administered 2023-07-02 – 2023-07-09 (×23): 5000 [IU] via SUBCUTANEOUS
  Filled 2023-07-01 (×23): qty 1

## 2023-07-01 MED ORDER — HYDRALAZINE HCL 25 MG PO TABS
25.0000 mg | ORAL_TABLET | Freq: Three times a day (TID) | ORAL | Status: DC
  Administered 2023-07-02 – 2023-07-04 (×7): 25 mg via ORAL
  Filled 2023-07-01 (×9): qty 1

## 2023-07-01 MED ORDER — ACETAMINOPHEN 325 MG PO TABS
650.0000 mg | ORAL_TABLET | Freq: Four times a day (QID) | ORAL | Status: DC | PRN
  Administered 2023-07-02 – 2023-07-09 (×5): 650 mg via ORAL
  Filled 2023-07-01 (×5): qty 2

## 2023-07-01 NOTE — ED Provider Notes (Signed)
[place] EMERGENCY DEPARTMENT AT [facility] Provider Note   CSN: [id number] Arrival date & time: 07/01/23  1712     History  Chief Complaint  Patient presents with   Shortness of Breath    Sherry Fitzpatrick is a 72 y.o. female.  The history is provided by the patient, medical records and a relative. No language interpreter was used.  Shortness of Breath Severity:  Severe Onset quality:  Gradual Duration:  3 days Timing:  Constant Progression:  Waxing and waning Chronicity:  New Context: URI   Relieved by:  Nothing Worsened by:  Nothing Ineffective treatments:  None tried Associated symptoms: cough, sputum production and wheezing   Associated symptoms: no abdominal pain, no chest pain, no headaches, no rash and no vomiting   Risk factors: no hx of PE/DVT        Home Medications Prior to Admission medications   Medication Sig Start Date End Date Taking? Authorizing Provider  acetaminophen (TYLENOL) 325 MG tablet Take 2 tablets (650 mg total) by mouth every 6 (six) hours as needed (Mild pain). Patient taking differently: Take 650 mg by mouth as needed for mild pain or moderate pain. 06/09/20   Angiulli, Mcarthur Rossetti, PA-C  albuterol (VENTOLIN HFA) 108 (90 Base) MCG/ACT inhaler Inhale 2 puffs into the lungs every 6 (six) hours as needed for wheezing. 12/26/22   Pokhrel, Rebekah Chesterfield, MD  amiodarone (PACERONE) 200 MG tablet Take 100 mg by mouth daily.    [provider]  amLODipine (NORVASC) 10 MG tablet Take 1 tablet (10 mg total) by mouth daily. 12/26/22   Pokhrel, Rebekah Chesterfield, MD  aspirin 81 MG chewable tablet Chew 81 mg by mouth daily.    [provider]  atorvastatin (LIPITOR) 80 MG tablet Take 1 tablet (80 mg total) by mouth daily. 12/26/22   Pokhrel, Rebekah Chesterfield, MD  budesonide-formoterol (SYMBICORT) 160-4.5 MCG/ACT inhaler Inhale 2 puffs into the lungs 2 (two) times daily. Patient taking differently: Inhale into the lungs 2 (two) times daily. 06/09/20   Angiulli,  Mcarthur Rossetti, PA-C  carvedilol (COREG) 6.25 MG tablet Take 1 tablet (6.25 mg total) by mouth 2 (two) times daily. 12/26/22 03/26/23  Pokhrel, Rebekah Chesterfield, MD  clopidogrel (PLAVIX) 75 MG tablet Take 1 tablet (75 mg total) by mouth daily. 06/09/20   Angiulli, Mcarthur Rossetti, PA-C  docusate sodium (COLACE) 100 MG capsule Take 1 capsule (100 mg total) by mouth 2 (two) times daily. 12/26/22   Pokhrel, Rebekah Chesterfield, MD  ferrous sulfate 325 (65 FE) MG tablet Take 1 tablet (325 mg total) by mouth 3 (three) times daily with meals. Patient taking differently: Take 325 mg by mouth 2 (two) times daily with a meal. 06/09/20   Angiulli, Mcarthur Rossetti, PA-C  furosemide (LASIX) 40 MG tablet Take 1 tablet (40 mg total) by mouth daily at 2 PM. 12/26/22 03/26/23  Pokhrel, Rebekah Chesterfield, MD  hydrALAZINE (APRESOLINE) 25 MG tablet Take 1 tablet (25 mg total) by mouth every 8 (eight) hours. 12/26/22 03/26/23  Pokhrel, Rebekah Chesterfield, MD  isosorbide mononitrate (IMDUR) 120 MG 24 hr tablet Take 120 mg by mouth daily.    [provider]  nitroGLYCERIN (NITROSTAT) 0.4 MG SL tablet Place 1 tablet (0.4 mg total) under the tongue every 5 (five) minutes x 3 doses as needed for chest pain. 05/25/20   Rinaldo Cloud, MD  pantoprazole (PROTONIX) 40 MG tablet Take 1 tablet (40 mg total) by mouth at bedtime. Patient taking differently: Take 40 mg by mouth daily. 06/09/20   Angiulli,  Mcarthur Rossetti, PA-C  polyethylene glycol (MIRALAX / GLYCOLAX) 17 g packet Take 17 g by mouth daily. Patient taking differently: Take 17 g by mouth daily as needed for moderate constipation. 06/09/20   Angiulli, Mcarthur Rossetti, PA-C  potassium chloride SA (KLOR-CON M) 20 MEQ tablet Take 1 tablet (20 mEq total) by mouth daily. 12/26/22   Pokhrel, Rebekah Chesterfield, MD  potassium chloride (KLOR-CON) 10 MEQ tablet Take 2 tablets (20 mEq total) by mouth daily. Patient not taking: Reported on 12/16/2022 06/09/20 12/26/22  Angiulli, Mcarthur Rossetti, PA-C      Allergies    Morphine and codeine, Penicillins, Percocet  [oxycodone-acetaminophen], Ace inhibitors, Iodinated contrast media, Iodine, Etodolac, Tramadol, and Vicodin [hydrocodone-acetaminophen]    Review of Systems   Review of Systems  Constitutional:  Positive for chills and fatigue.  HENT:  Positive for congestion.   Respiratory:  Positive for cough, sputum production, chest tightness, shortness of breath and wheezing. Negative for stridor.   Cardiovascular:  Positive for leg swelling. Negative for chest pain and palpitations.  Gastrointestinal:  Negative for abdominal pain, constipation, diarrhea, nausea and vomiting.  Genitourinary:  Negative for flank pain.  Musculoskeletal:  Negative for back pain.  Skin:  Negative for rash.  Neurological:  Negative for light-headedness and headaches.  Psychiatric/Behavioral:  Negative for agitation and confusion.   All other systems reviewed and are negative.   Physical Exam Updated Vital Signs BP 136/75   Pulse (!) 58   Temp 99.1 F (37.3 C) (Oral)   Resp 20   SpO2 99%  Physical Exam Vitals and nursing note reviewed.  Constitutional:      General: She is not in acute distress.    Appearance: She is well-developed. She is not ill-appearing, toxic-appearing or diaphoretic.  HENT:     Head: Normocephalic and atraumatic.  Eyes:     Conjunctiva/sclera: Conjunctivae normal.     Pupils: Pupils are equal, round, and reactive to light.  Cardiovascular:     Rate and Rhythm: Normal rate and regular rhythm.     Heart sounds: No murmur heard. Pulmonary:     Effort: Pulmonary effort is normal. No respiratory distress.     Breath sounds: Wheezing, rhonchi and rales present.  Chest:     Chest wall: No tenderness.  Abdominal:     Palpations: Abdomen is soft.     Tenderness: There is no abdominal tenderness.  Musculoskeletal:        General: No swelling.     Cervical back: Neck supple.     Right lower leg: No tenderness. No edema.     Left lower leg: No tenderness. No edema.  Skin:    General:  Skin is warm and dry.     Capillary Refill: Capillary refill takes less than 2 seconds.  Neurological:     General: No focal deficit present.     Mental Status: She is alert.  Psychiatric:        Mood and Affect: Mood normal.     ED Results / Procedures / Treatments   Labs (all labs ordered are listed, but only abnormal results are displayed) Labs Reviewed  BASIC METABOLIC PANEL - Abnormal; Notable for the following components:      Result Value   Glucose, Bld 143 (*)    Creatinine, Ser 1.21 (*)    Calcium 8.3 (*)    GFR, Estimated 47 (*)    All other components within normal limits  CBC - Abnormal; Notable for the following components:  WBC 13.3 (*)    Hemoglobin 11.7 (*)    All other components within normal limits  BRAIN NATRIURETIC PEPTIDE - Abnormal; Notable for the following components:   B Natriuretic Peptide 170.6 (*)    All other components within normal limits  RESP PANEL BY RT-PCR (RSV, FLU A&B, COVID)  RVPGX2  EXPECTORATED SPUTUM ASSESSMENT W GRAM STAIN, RFLX TO RESP C  LEGIONELLA PNEUMOPHILA SEROGP 1 UR AG  STREP PNEUMONIAE URINARY ANTIGEN    EKG EKG Interpretation Date/Time:  Monday July 01 2023 17:24:13 EDT Ventricular Rate:  65 PR Interval:  190 QRS Duration:  86 QT Interval:  442 QTC Calculation: 459 R Axis:   46  Text Interpretation: Sinus rhythm with Premature atrial complexes Anterior infarct , age undetermined ST & T wave abnormality, consider lateral ischemia Abnormal ECG When compared with ECG of 22-Dec-2022 13:24, PREVIOUS ECG IS PRESENT when compared to prior, more artifact. No STEMI Confirmed by Theda Belfast ([id number]) on 07/01/2023 8:38:00 PM  Radiology DG Chest 2 View  Result Date: 07/01/2023 CLINICAL DATA:  COPD EXAM: CHEST - 2 VIEW COMPARISON:  12/24/2022 FINDINGS: Cardiac shadow is enlarged but stable. Aortic calcifications are noted. The lungs well aerated bilaterally. Central vascular congestion is noted without significant edema.  No bony abnormality is noted. IMPRESSION: Central vascular congestion without significant edema. Electronically Signed   By: Alcide Clever M.D.   On: 07/01/2023 19:53    Procedures Procedures    Medications Ordered in ED Medications  albuterol (VENTOLIN HFA) 108 (90 Base) MCG/ACT inhaler 2 puff (has no administration in time range)    ED Course/ Medical Decision Making/ A&P                                 Medical Decision Making Amount and/or Complexity of Data Reviewed Labs: ordered. Radiology: ordered.  Risk Prescription drug management. Decision regarding hospitalization.    BREOSHA ANTONINI is a 71 y.o. female with a past medical history significant for sleep apnea, hypertension, hyperlipidemia, asthma, COPD, CKD, previous stroke, paroxysmal atrial fibrillation, and CHF who presents with URI symptoms and hypoxia.  According to patient and family, for the last few days she has been having worsened URI symptoms with cough wheezing and shortness of breath.  She reports chills and subjective fevers.  She reportedly had oxygen saturations in the 70s with EMS on room air.  She does not take oxygen at home.  She is currently on 3 L nasal cannula to keep her oxygen saturations in the 90s.  She was given Solu-Medrol, DuoNebs, and magnesium and route and that has improved her breathing.  Patient is denying any chest pain or palpitations and denies significant nausea, vomiting, constipation, diarrhea, or urinary changes.  She does not think she has extra fluid on board and denies peripheral edema worsening.  Denies any leg pain.  She was found to be hypoxic in the 80s on arrival here as well.  On exam, she has rhonchi, rales, and wheezing.  Chest is nontender.  Abdomen nontender.  No murmur.  Good pulses in extremities.  Legs are nontender and nonedematous.  Patient had workup in triage including a chest x-ray that does not show pneumonia or significant edema, just vascular congestion.  EKG did  not show STEMI.  CBC and BMP shows leukocytosis but similar creatinine to prior.  Mild anemia.  BNP is improved prior and she is negative for COVID/flu/RSV.  Clinically  suspect URI that is likely viral causing COPD exacerbation.  Given her new hypoxia, she will need admission.  Will discuss with medicine team if they would like [place] to give her antibiotics as the x-ray did not show convincing evidence of pneumonia.  Will also see if they want [place] to get a noncontrasted CT scan.  Low suspicion for DVT or PE given her lack of tachycardia or pleuritic discomfort.  She will be admitted for further management.         Final Clinical Impression(s) / ED Diagnoses Final diagnoses:  Hypoxia  COPD exacerbation (HCC)  Upper respiratory tract infection, unspecified type     Clinical Impression: 1. Hypoxia   2. COPD exacerbation (HCC)   3. Upper respiratory tract infection, unspecified type     Disposition: Admit  This note was prepared with assistance of Dragon voice recognition software. Occasional wrong-word or sound-a-like substitutions may have occurred due to the inherent limitations of voice recognition software.     Syaire Saber, Canary Brim, MD 07/01/23 [phone]

## 2023-07-01 NOTE — ED Provider Triage Note (Signed)
Emergency Medicine Provider Triage Evaluation Note  Sherry Fitzpatrick , a 72 y.o. female  was evaluated in triage.  Pt complains of shortness of breath.  Patient has prior history significant for COPD, morbid obesity, hyperlipidemia, hypertension and sleep apnea.  Ports that she is been having worsening shortness of breath and cold symptoms for the last 2 days.  Was given multiple rounds of DuoNebs, Solu-Medrol, and magnesium by EMS en route to the hospital.  Patient still satting only in the mid 90% with 2 L via nasal cannula.  Does Not wear oxygen at baseline..  Review of Systems  Positive: As above Negative: As above  Physical Exam  BP (!) 159/70 (BP Location: Left Arm)   Pulse 66   Temp 99.1 F (37.3 C) (Oral)   Resp (!) 22   SpO2 96%  Gen:   Awake, no distress   Resp:  Normal effort  MSK:   Moves extremities without difficulty  Other:    Medical Decision Making  Medically screening exam initiated at 6:38 PM.  Appropriate orders placed.  Sherry Fitzpatrick was informed that the remainder of the evaluation will be completed by another provider, this initial triage assessment does not replace that evaluation, and the importance of remaining in the ED until their evaluation is complete.     Sherry Knudsen, PA-C 07/01/23 1839

## 2023-07-01 NOTE — ED Triage Notes (Signed)
Pt to ED BIB EMS with complaint of SHOB and cold sx x2 days. No improvement with use of inhaler. EMS reports SPO2 75% on RA upon arrival to scene. Wheezing and rhonchi initially - given 2 duo nebs, 125mg  solumedrol and 2g magnesium en route. SPO2 improved to 95% after treatments and currently wearing 2L O2 via [place].   Hx of CHF and COPD

## 2023-07-01 NOTE — H&P (Signed)
History and Physical    Sherry Fitzpatrick:[id number] DOB: 02-08-1951 DOA: 07/01/2023  PCP: Shon Hale, MD   Patient coming from: Home   Chief Complaint:  Chief Complaint  Patient presents with   Shortness of Breath    HPI:  Sherry Fitzpatrick is a 72 y.o. female with medical history significant of COPD, diabetes type 2, history of CAD, grade 3 diastolic heart failure, restrictive cardiomyopathy, CVA, atrial fibrillation not on anticoagulation, obstructive sleep apnea, GERD,  chronic anemia, obesity, hyperlipidemia and CKD stage 3B presented to emergency department for evaluation for presented to emergency department evaluation for shortness of breath, cold symptoms for last 2 days.  EMS reported patient O2 sat dropped to 75% on arrival.  Patient has wheezing and rhonchi.  Patient received DuoNeb, Solu-Medrol and mag sulfate en route to ED. Patient reported that she has upper respiratory tract symptoms for last 7 days which has been gradually progressed to development of productive cough and worsening shortness of breath.  She does not need to use oxygen at the baseline.  She denies any fever and chills.  Denies any nausea, vomiting abdominal pain. Patient daughter reported that she is not compliant and using inhaler at home    ED Course:  At presentation to ED patient O2 sat dropped to 85% room air.  Currently maintained 96% on 2 L. Respiratory panel negative. Elevated BNP 170. CBC showed leukocytosis 13.7 otherwise unremarkable. BMP sodium 137, potassium 3.7, chloride 99, bicarb 28, blood glucose 143, BUN 22, creatinine 1.2, calcium 8.3 and GFR 47.  Anion gap 10. Chest x-ray showed central vascular congestion without significant edema.  No evidence of pneumonia.  EKG showed sinus rhythm with premature ventricular complex heart rate 65.  No ST and T wave abnormality.  Per ED physician Dr. Damita Lack there is concern for COPD exacerbation possibly from viral upper respiratory  tract infection and I also agree with ED physician evaluation.  Hospitalist has been contacted for further evaluation and management of COPD exacerbation.   Review of Systems:  Review of Systems  Constitutional:  Negative for chills, diaphoresis, fever, malaise/fatigue and weight loss.  Respiratory:  Positive for cough, sputum production, shortness of breath and wheezing. Negative for hemoptysis.   Cardiovascular:  Negative for chest pain, palpitations, orthopnea, claudication and leg swelling.  Gastrointestinal:  Negative for abdominal pain, heartburn, nausea and vomiting.  Genitourinary:  Negative for dysuria, frequency and urgency.  Musculoskeletal:  Negative for back pain, joint pain, myalgias and neck pain.  Skin:  Negative for itching.  Neurological:  Negative for dizziness and headaches.  Psychiatric/Behavioral:  The patient is not nervous/anxious.     Past Medical History:  Diagnosis Date   Acute non-Q wave non-ST elevation myocardial infarction (NSTEMI) (HCC) 05/24/2020   Acute respiratory failure with hypoxia (HCC) 11/22/2019   AKI (acute kidney injury) (HCC) 07/03/2018   Alterations of sensations, late effect of cerebrovascular disease(438.6)    Arthritis    "all over"   Backache, unspecified    Body mass index 40.0-44.9, adult (HCC)    Carpal tunnel syndrome    Cerebral thrombosis with cerebral infarction 05/29/2020   Dizziness and giddiness    Esophageal reflux    Generalized pain    Gout attack 03/28/2020   Headache    "had alot til I had pituitary tumor removed"   Heart murmur    Hypertensive encephalopathy 03/28/2020   Mild cognitive impairment 07/16/2022   NSTEMI (non-ST elevated myocardial infarction) (HCC)  Other abnormal glucose    Other and unspecified hyperlipidemia    Other malaise and fatigue    Other postprocedural status(V45.89)    Other symptoms involving cardiovascular system    Shock (HCC)    Sleep apnea    "did test; they wanted me to get a  CPAP but I never did get it" (04/14/2015) Does use inhaler at night (01 2021)   Stroke (HCC) 06/2003; 02/2010   "minor; minor" ; denies residual on 04/14/2015   Unspecified disorder of the pituitary gland and its hypothalamic control    Unspecified essential hypertension    Unspecified visual disturbance     Past Surgical History:  Procedure Laterality Date   CARPAL TUNNEL RELEASE Left ~ 2014   CORONARY STENT INTERVENTION N/A 05/24/2020   Procedure: CORONARY STENT INTERVENTION;  Surgeon: Corky Crafts, MD;  Location: [facility];  Service: Cardiovascular;  Laterality: N/A;   JOINT REPLACEMENT     LEFT HEART CATH AND CORONARY ANGIOGRAPHY N/A 05/24/2020   Procedure: LEFT HEART CATH AND CORONARY ANGIOGRAPHY;  Surgeon: Rinaldo Cloud, MD;  Location: [facility];  Service: Cardiovascular;  Laterality: N/A;   TOTAL HIP ARTHROPLASTY Right 11/06/2010   TRANSPHENOIDAL / TRANSNASAL HYPOPHYSECTOMY / RESECTION PITUITARY TUMOR  06/13/2012   TUBAL LIGATION  1974     reports that she has never smoked. She has never used smokeless tobacco. She reports current alcohol use. She reports that she does not use drugs.  Allergies  Allergen Reactions   Morphine And Codeine Hives and Nausea And Vomiting   Penicillins Hives    Has patient had a PCN reaction causing immediate rash, facial/tongue/throat swelling, SOB or lightheadedness with hypotension: Yes Has patient had a PCN reaction causing severe rash involving mucus membranes or skin necrosis: No Has patient had a PCN reaction that required hospitalization: No Has patient had a PCN reaction occurring within the last 10 years: No If all of the above answers are "NO", then may proceed with Cephalosporin use.   Percocet [Oxycodone-Acetaminophen] Nausea And Vomiting   Ace Inhibitors Nausea And Vomiting and Cough   Iodinated Contrast Media Nausea And Vomiting        Iodine Itching   Etodolac Nausea And Vomiting   Tramadol Nausea And  Vomiting   Vicodin [Hydrocodone-Acetaminophen] Nausea And Vomiting    Family History  Problem Relation Age of Onset   Heart failure Mother    Hypertension Mother    Diabetes Father    Hypertension Sister    Heart failure Sister    Hypertension Daughter     Prior to Admission medications   Medication Sig Start Date End Date Taking? Authorizing Provider  acetaminophen (TYLENOL) 325 MG tablet Take 2 tablets (650 mg total) by mouth every 6 (six) hours as needed (Mild pain). Patient taking differently: Take 650 mg by mouth as needed for mild pain or moderate pain. 06/09/20   Angiulli, Mcarthur Rossetti, PA-C  albuterol (VENTOLIN HFA) 108 (90 Base) MCG/ACT inhaler Inhale 2 puffs into the lungs every 6 (six) hours as needed for wheezing. 12/26/22   Pokhrel, Rebekah Chesterfield, MD  amiodarone (PACERONE) 200 MG tablet Take 100 mg by mouth daily.    [provider]  amLODipine (NORVASC) 10 MG tablet Take 1 tablet (10 mg total) by mouth daily. 12/26/22   Pokhrel, Rebekah Chesterfield, MD  aspirin 81 MG chewable tablet Chew 81 mg by mouth daily.    [provider]  atorvastatin (LIPITOR) 80 MG tablet Take 1 tablet (80  mg total) by mouth daily. 12/26/22   Pokhrel, Rebekah Chesterfield, MD  budesonide-formoterol (SYMBICORT) 160-4.5 MCG/ACT inhaler Inhale 2 puffs into the lungs 2 (two) times daily. Patient taking differently: Inhale into the lungs 2 (two) times daily. 06/09/20   Angiulli, Mcarthur Rossetti, PA-C  carvedilol (COREG) 6.25 MG tablet Take 1 tablet (6.25 mg total) by mouth 2 (two) times daily. 12/26/22 03/26/23  Pokhrel, Rebekah Chesterfield, MD  clopidogrel (PLAVIX) 75 MG tablet Take 1 tablet (75 mg total) by mouth daily. 06/09/20   Angiulli, Mcarthur Rossetti, PA-C  docusate sodium (COLACE) 100 MG capsule Take 1 capsule (100 mg total) by mouth 2 (two) times daily. 12/26/22   Pokhrel, Rebekah Chesterfield, MD  ferrous sulfate 325 (65 FE) MG tablet Take 1 tablet (325 mg total) by mouth 3 (three) times daily with meals. Patient taking differently: Take 325 mg by mouth 2 (two)  times daily with a meal. 06/09/20   Angiulli, Mcarthur Rossetti, PA-C  furosemide (LASIX) 40 MG tablet Take 1 tablet (40 mg total) by mouth daily at 2 PM. 12/26/22 03/26/23  Pokhrel, Rebekah Chesterfield, MD  hydrALAZINE (APRESOLINE) 25 MG tablet Take 1 tablet (25 mg total) by mouth every 8 (eight) hours. 12/26/22 03/26/23  Pokhrel, Rebekah Chesterfield, MD  isosorbide mononitrate (IMDUR) 120 MG 24 hr tablet Take 120 mg by mouth daily.    [provider]  nitroGLYCERIN (NITROSTAT) 0.4 MG SL tablet Place 1 tablet (0.4 mg total) under the tongue every 5 (five) minutes x 3 doses as needed for chest pain. 05/25/20   Rinaldo Cloud, MD  pantoprazole (PROTONIX) 40 MG tablet Take 1 tablet (40 mg total) by mouth at bedtime. Patient taking differently: Take 40 mg by mouth daily. 06/09/20   Angiulli, Mcarthur Rossetti, PA-C  polyethylene glycol (MIRALAX / GLYCOLAX) 17 g packet Take 17 g by mouth daily. Patient taking differently: Take 17 g by mouth daily as needed for moderate constipation. 06/09/20   Angiulli, Mcarthur Rossetti, PA-C  potassium chloride SA (KLOR-CON M) 20 MEQ tablet Take 1 tablet (20 mEq total) by mouth daily. 12/26/22   Pokhrel, Rebekah Chesterfield, MD  potassium chloride (KLOR-CON) 10 MEQ tablet Take 2 tablets (20 mEq total) by mouth daily. Patient not taking: Reported on 12/16/2022 06/09/20 12/26/22  Charlton Amor, PA-C     Physical Exam: Vitals:   07/01/23 1741 07/01/23 1745 07/01/23 2100 07/01/23 2300  BP: (!) 159/70  136/75 (!) 125/59  Pulse: 66  (!) 58 (!) 58  Resp: (!) 22  20   Temp: 99.1 F (37.3 C)   98.3 F (36.8 C)  TempSrc: Oral   Rectal  SpO2: (!) 85% 96% 99% 100%    Physical Exam Constitutional:      General: She is not in acute distress.    Appearance: She is not ill-appearing.  HENT:     Mouth/Throat:     Mouth: Mucous membranes are moist.  Cardiovascular:     Rate and Rhythm: Normal rate and regular rhythm.     Heart sounds: No murmur heard. Pulmonary:     Effort: Pulmonary effort is normal.     Breath sounds:  Examination of the right-upper field reveals wheezing and rhonchi. Examination of the left-upper field reveals wheezing and rhonchi. Examination of the right-middle field reveals wheezing. Examination of the left-middle field reveals wheezing. Wheezing and rhonchi present. No decreased breath sounds or rales.  Musculoskeletal:     Cervical back: Normal range of motion and neck supple.     Right lower leg: No edema.  Left lower leg: No edema.  Skin:    General: Skin is warm.     Capillary Refill: Capillary refill takes less than 2 seconds.     Coloration: Skin is not cyanotic or pale.     Findings: No erythema or rash.  Neurological:     Mental Status: She is oriented to person, place, and time.  Psychiatric:        Mood and Affect: Mood normal. Mood is not anxious.        Behavior: Behavior normal. Behavior is not agitated.      Labs on Admission: I have personally reviewed following labs and imaging studies  CBC: Recent Labs  Lab 07/01/23 1748 07/01/23 2334  WBC 13.3*  --   HGB 11.7* 12.2  HCT 37.4 36.0  MCV 90.8  --   PLT 325  --    Basic Metabolic Panel: Recent Labs  Lab 07/01/23 1748 07/01/23 2334  NA 137 138  K 3.7 4.3  CL 99  --   CO2 28  --   GLUCOSE 143*  --   BUN 12  --   CREATININE 1.21*  --   CALCIUM 8.3*  --    GFR: CrCl cannot be calculated (Unknown ideal weight.). Liver Function Tests: No results for input(s): "AST", "ALT", "ALKPHOS", "BILITOT", "PROT", "ALBUMIN" in the last 168 hours. No results for input(s): "LIPASE", "AMYLASE" in the last 168 hours. No results for input(s): "AMMONIA" in the last 168 hours. Coagulation Profile: No results for input(s): "INR", "PROTIME" in the last 168 hours. Cardiac Enzymes: No results for input(s): "CKTOTAL", "CKMB", "CKMBINDEX", "TROPONINI", "TROPONINIHS" in the last 168 hours. BNP (last 3 results) Recent Labs    12/15/22 1141 12/20/22 1105 07/01/23 1834  BNP 168.4* 232.8* 170.6*   HbA1C: No results  for input(s): "HGBA1C" in the last 72 hours. CBG: No results for input(s): "GLUCAP" in the last 168 hours. Lipid Profile: No results for input(s): "CHOL", "HDL", "LDLCALC", "TRIG", "CHOLHDL", "LDLDIRECT" in the last 72 hours. Thyroid Function Tests: No results for input(s): "TSH", "T4TOTAL", "FREET4", "T3FREE", "THYROIDAB" in the last 72 hours. Anemia Panel: No results for input(s): "VITAMINB12", "FOLATE", "FERRITIN", "TIBC", "IRON", "RETICCTPCT" in the last 72 hours. Urine analysis:    Component Value Date/Time   COLORURINE AMBER (A) 05/28/2020 0834   APPEARANCEUR TURBID (A) 05/28/2020 0834   LABSPEC 1.021 05/28/2020 0834   PHURINE 5.0 05/28/2020 0834   GLUCOSEU NEGATIVE 05/28/2020 0834   HGBUR LARGE (A) 05/28/2020 0834   BILIRUBINUR NEGATIVE 05/28/2020 0834   KETONESUR NEGATIVE 05/28/2020 0834   PROTEINUR 100 (A) 05/28/2020 0834   UROBILINOGEN 0.2 04/14/2015 1451   NITRITE NEGATIVE 05/28/2020 0834   LEUKOCYTESUR MODERATE (A) 05/28/2020 0834    Radiological Exams on Admission: I have personally reviewed images DG Chest 2 View  Result Date: 07/01/2023 CLINICAL DATA:  COPD EXAM: CHEST - 2 VIEW COMPARISON:  12/24/2022 FINDINGS: Cardiac shadow is enlarged but stable. Aortic calcifications are noted. The lungs well aerated bilaterally. Central vascular congestion is noted without significant edema. No bony abnormality is noted. IMPRESSION: Central vascular congestion without significant edema. Electronically Signed   By: Alcide Clever M.D.   On: 07/01/2023 19:53    EKG: My personal interpretation of EKG shows: EKG shows sinus rhythm premature atrial complex, heart rate 65.  No ST-T wave abnormality    Assessment/Plan: Principal Problem:   COPD with acute exacerbation (HCC) Active Problems:   Acute respiratory failure with hypoxia (HCC)   Hyperlipidemia   Diastolic  heart failure (HCC)   CKD stage 3b, GFR 30-44 ml/min (HCC)   Chronic anemia   History of CVA (cerebrovascular  accident)   Paroxysmal atrial fibrillation (HCC)   Hx of non-insulin dependent diabetes mellitus   History of coronary artery disease   Restrictive cardiomyopathy (HCC)   History of obstructive sleep apnea   Obesity (BMI 30-39.9)    Assessment and Plan: COPD exacerbation Acute hypoxic respiratory failure -Patient presenting with worsening shortness of breath with productive cough for last 1 week.  Patient reported that she has upper respiratory tract symptom include runny nose with occasional cough for last 7 days however noticed shortness of breath is getting worse.  Denies any fever and chills.  No lower extremity swelling. -Per EMS patient found hypoxic O2 sat dropped to 85 to 86% which improved to 96% with breathing treatment, Solu-Medrol 125 mg once, breathing treatment with DuoNeb x 2 and mag sulfate 2 g IV once. -Patient is afebrile.  Slight leukocytosis 13 secondary from IV steroid. -Respiratory panel negative.  Chest ray showed pulmonary vascular congestion without significant edema. - Given patient is afebrile and no evidence of pneumonia on chest x-ray low probability of pneumonia which causing COPD exacerbation.  Possibly viral etiology causing COPD exacerbation. - Checking sputum culture, blood culture, urine Legionella and urine strep antigen before initiating any IV antibiotic.  Also continue to monitor fever curve. - Continue methylprednisone 80 mg daily for 4 days - Continue DuoNeb every 6 hours scheduled and Xopenex as needed. -Continue Mucinex, spirometry, and flutter valve. - Continue supplemental oxygen and keep O2 sat above 89%. - Continue aspiration precaution.   Diastolic heart failure Restrictive cardiomyopathy Per chart review previous echocardiogram from 01/02/2023 showed grade 3 diastolic heart failure with preserved EF 65 to 70% -Patient is presenting with cough with sputum production.  Elevated BNP 170 which is around patient's baseline.  Physical exam  unremarkable for any volume overload.  Chest x-ray showed central congestion.  Low probability of CHF exacerbation at this time - Plan to continue home medications - Continue amlodipine 10 mg daily, Lasix 40 mg daily, hydralazine 25 mg 3 times daily and Avapro 300 mg daily. - Continue to monitor blood pressure. - Continue monitor urine output and daily weight.   History of CAD -Continue aspirin 81 mg daily, Plavix 75 mg daily, Coreg 6.25 mg twice daily and Lipitor 40 mg daily.  Paroxysmal atrial fibrillation -Patient has history of paroxysmal atrial fibrillation.  EKG showed normal sinus rhythm with premature atrial complex and heart rate 65. -Per chart review patient is not previously on blood thinner unclear etiology. -Continue amiodarone 100 mg daily and Coreg 6.25 mg twice daily  CKD stage 3B -Creatinine 1.21 and GFR 47.  Renal function at baseline. - Continue to monitor urine output - Continue to monitor renal function - Avoid nephrotoxic agent and renally adjust medications.   History of CVA -Continue aspirin, Plavix and statin  Chronic anemia -Stable H&H - Continue ferrous sulfate twice daily.  History of non-insulin-dependent DM type II -A1c 7.3 in 05/2020.  Not on any oral antidiabetic agent or not on insulin. - Checking A1c - Continue low sliding scale insulin and at bedtime insulin as needed coverage with meal and bedtime.  History of OSA -Patient reported history of patient not using CPAP at home. - Continue supplemental oxygen at bedtime.  Morbid obesity -Counseled patient about weight reduction, diet and exercise.   DVT prophylaxis:  SQ Heparin Code Status:  Full Code.  Verified with patient's daughter at bedside Diet: Heart healthy carb modified diet.  Weight restriction 2 L/day salt restriction 2 g/day Family Communication: Discussed treatment plan with both with patient and her daughter Disposition Plan: Pending improvement of shortness of breath.   Pending blood culture sputum culture Consults:  none Admission status:   Inpatient, Telemetry bed  Severity of Illness: The appropriate patient status for this patient is INPATIENT. Inpatient status is judged to be reasonable and necessary in order to provide the required intensity of service to ensure the patient's safety. The patient's presenting symptoms, physical exam findings, and initial radiographic and laboratory data in the context of their chronic comorbidities is felt to place them at high risk for further clinical deterioration. Furthermore, it is not anticipated that the patient will be medically stable for discharge from the hospital within 2 midnights of admission.   * I certify that at the point of admission it is my clinical judgment that the patient will require inpatient hospital care spanning beyond 2 midnights from the point of admission due to high intensity of service, high risk for further deterioration and high frequency of surveillance required.Marland Kitchen    Tereasa Coop, MD [facility]  How to contact the [facility] Attending or Consulting provider 7A - 7P or covering provider during after hours 7P -7A, for this patient.  Check the care team in [facility] and look for a) attending/consulting [facility] provider listed and b) the [facility] team listed Log into [email or web address] and use [place]'s universal password to access. If you do not have the password, please contact the hospital operator. Locate the [facility] provider you are looking for under [facility] and page to a number that you can be directly reached. If you still have difficulty reaching the provider, please page the [facility] (Director on Call) for the Hospitalists listed on amion for assistance.  07/01/2023, 11:44 PM

## 2023-07-02 DIAGNOSIS — Z8639 Personal history of other endocrine, nutritional and metabolic disease: Secondary | ICD-10-CM | POA: Diagnosis not present

## 2023-07-02 DIAGNOSIS — J9601 Acute respiratory failure with hypoxia: Secondary | ICD-10-CM | POA: Diagnosis not present

## 2023-07-02 DIAGNOSIS — N1832 Chronic kidney disease, stage 3b: Secondary | ICD-10-CM | POA: Diagnosis not present

## 2023-07-02 DIAGNOSIS — J441 Chronic obstructive pulmonary disease with (acute) exacerbation: Secondary | ICD-10-CM | POA: Diagnosis not present

## 2023-07-02 LAB — CBC
HCT: 37 % (ref 36.0–46.0)
Hemoglobin: 11.3 g/dL — ABNORMAL LOW (ref 12.0–15.0)
MCH: 27.9 pg (ref 26.0–34.0)
MCHC: 30.5 g/dL (ref 30.0–36.0)
MCV: 91.4 fL (ref 80.0–100.0)
Platelets: 314 10*3/uL (ref 150–400)
RBC: 4.05 MIL/uL (ref 3.87–5.11)
RDW: 15.7 % — ABNORMAL HIGH (ref 11.5–15.5)
WBC: 11.6 10*3/uL — ABNORMAL HIGH (ref 4.0–10.5)
nRBC: 0 % (ref 0.0–0.2)

## 2023-07-02 LAB — STREP PNEUMONIAE URINARY ANTIGEN: Strep Pneumo Urinary Antigen: NEGATIVE

## 2023-07-02 LAB — COMPREHENSIVE METABOLIC PANEL
ALT: 18 U/L (ref 0–44)
AST: 24 U/L (ref 15–41)
Albumin: 3.1 g/dL — ABNORMAL LOW (ref 3.5–5.0)
Alkaline Phosphatase: 69 U/L (ref 38–126)
Anion gap: 11 (ref 5–15)
BUN: 21 mg/dL (ref 8–23)
CO2: 26 mmol/L (ref 22–32)
Calcium: 8.3 mg/dL — ABNORMAL LOW (ref 8.9–10.3)
Chloride: 100 mmol/L (ref 98–111)
Creatinine, Ser: 2.12 mg/dL — ABNORMAL HIGH (ref 0.44–1.00)
GFR, Estimated: 24 mL/min — ABNORMAL LOW (ref >60)
Glucose, Bld: 216 mg/dL — ABNORMAL HIGH (ref 70–99)
Potassium: 4.1 mmol/L (ref 3.5–5.1)
Sodium: 137 mmol/L (ref 135–145)
Total Bilirubin: 0.5 mg/dL (ref 0.3–1.2)
Total Protein: 7.6 g/dL (ref 6.5–8.1)

## 2023-07-02 LAB — CBG MONITORING, ED
Glucose-Capillary: 167 mg/dL — ABNORMAL HIGH (ref 70–99)
Glucose-Capillary: 163 mg/dL — ABNORMAL HIGH (ref 70–99)
Glucose-Capillary: 171 mg/dL — ABNORMAL HIGH (ref 70–99)

## 2023-07-02 LAB — GLUCOSE, CAPILLARY
Glucose-Capillary: 164 mg/dL — ABNORMAL HIGH (ref 70–99)
Glucose-Capillary: 154 mg/dL — ABNORMAL HIGH (ref 70–99)

## 2023-07-02 LAB — HEMOGLOBIN A1C
Hgb A1c MFr Bld: 5.5 % (ref 4.8–5.6)
Mean Plasma Glucose: 111.15 mg/dL

## 2023-07-02 MED ORDER — INFLUENZA VAC A&B SURF ANT ADJ 0.5 ML IM SUSY
0.5000 mL | PREFILLED_SYRINGE | INTRAMUSCULAR | Status: AC
  Administered 2023-07-03: 0.5 mL via INTRAMUSCULAR
  Filled 2023-07-02: qty 0.5

## 2023-07-02 MED ORDER — PNEUMOCOCCAL 20-VAL CONJ VACC 0.5 ML IM SUSY
0.5000 mL | PREFILLED_SYRINGE | INTRAMUSCULAR | Status: AC
  Administered 2023-07-03: 0.5 mL via INTRAMUSCULAR
  Filled 2023-07-02: qty 0.5

## 2023-07-02 NOTE — Subjective & Objective (Signed)
Pt seen and examined. Pt still in ER bed awaiting floor bed. Remains on supplemental O2 @ 3L/min. Still with audible wheezing. No fevers.

## 2023-07-02 NOTE — Assessment & Plan Note (Addendum)
Stable. Baseline HgB 10-11 On oral iron.

## 2023-07-02 NOTE — Assessment & Plan Note (Signed)
Add SSI due to IV solumedrol

## 2023-07-02 NOTE — Assessment & Plan Note (Addendum)
On ASA, lipitor, coreg

## 2023-07-02 NOTE — Assessment & Plan Note (Signed)
Chronic. 

## 2023-07-02 NOTE — Assessment & Plan Note (Signed)
Stable

## 2023-07-02 NOTE — Progress Notes (Signed)
PROGRESS NOTE    Sherry Fitzpatrick  JWJ:[id number] DOB: 1951/08/27 DOA: 07/01/2023 PCP: Shon Hale, MD  Subjective: Pt seen and examined. Pt still in ER bed awaiting floor bed. Remains on supplemental O2 @ 3L/min. Still with audible wheezing. No fevers.   Hospital Course: HPI: Sherry Fitzpatrick is a 72 y.o. female with medical history significant of COPD, diabetes type 2, history of CAD, grade 3 diastolic heart failure, restrictive cardiomyopathy, CVA, atrial fibrillation not on anticoagulation, obstructive sleep apnea, GERD,  chronic anemia, obesity, hyperlipidemia and CKD stage 3B presented to emergency department for evaluation for presented to emergency department evaluation for shortness of breath, cold symptoms for last 2 days.  EMS reported patient O2 sat dropped to 75% on arrival.  Patient has wheezing and rhonchi.  Patient received DuoNeb, Solu-Medrol and mag sulfate en route to ED. Patient reported that she has upper respiratory tract symptoms for last 7 days which has been gradually progressed to development of productive cough and worsening shortness of breath.  She does not need to use oxygen at the baseline.  She denies any fever and chills.  Denies any nausea, vomiting abdominal pain. Patient daughter reported that she is not compliant and using inhaler at home  ED Course:  At presentation to ED patient O2 sat dropped to 85% room air.  Currently maintained 96% on 2 L. Respiratory panel negative. Elevated BNP 170. CBC showed leukocytosis 13.7 otherwise unremarkable. BMP sodium 137, potassium 3.7, chloride 99, bicarb 28, blood glucose 143, BUN 22, creatinine 1.2, calcium 8.3 and GFR 47.  Anion gap 10. Chest x-ray showed central vascular congestion without significant edema.  No evidence of pneumonia.   EKG showed sinus rhythm with premature ventricular complex heart rate 65.  No ST and T wave abnormality.   Per ED physician Dr. Damita Lack there is concern for COPD exacerbation  possibly from viral upper respiratory tract infection and I also agree with ED physician evaluation.  Hospitalist has been contacted for further evaluation and management of COPD exacerbation.    Significant Events: Admitted 07/01/2023   Significant Labs: Admitting BNP 170  Significant Imaging Studies: Admitting CXR shows Central vascular congestion without significant edema.   Antibiotic Therapy: Anti-infectives (From admission, onward)    Start     Dose/Rate Route Frequency Ordered Stop   07/02/23 2315  azithromycin (ZITHROMAX) tablet 500 mg       Placed in "Followed by" Linked Group   500 mg Oral Daily 07/01/23 2305 07/06/23 0959   07/01/23 2304  azithromycin (ZITHROMAX) 500 mg in sodium chloride 0.9 % 250 mL IVPB       Placed in "Followed by" Linked Group   500 mg 250 mL/hr over 60 Minutes Intravenous Every 24 hours 07/01/23 2305 07/02/23 0202       Procedures:   Consultants:     Assessment and Plan: * COPD with acute exacerbation [facility]) Admitted for COPD exacerbation. Still wheezing this AM.  Continue with nebs and IV solumedrol. Also on Rocephin/zithromax Pt not normally on home O2. Will need home nebulizer machine at discharge. TOC consulted for DME  Acute respiratory failure with hypoxia (HCC) Not normally on home O2. Wean as tolerated to keep O2 sats >92%  Obesity (BMI 30-39.9) Chronic.  History of obstructive sleep apnea Stable.  Restrictive cardiomyopathy (HCC) Stable. On coreg  History of coronary artery disease On ASA, lipitor, coreg  Hx of non-insulin dependent diabetes mellitus Add SSI due to IV solumedrol  Paroxysmal atrial fibrillation (HCC) -  On DAPT per neurology. Pt has not been changed to systemic anticoagulation by outpatient neurology. On DAPT per neurology. Pt has not been changed to systemic anticoagulation by outpatient neurology.  History of CVA (cerebrovascular accident) Stable. On ASA.  Chronic anemia Stable. Baseline HgB  10-11 On oral iron.  CKD stage 3b, GFR 30-44 ml/min (HCC) Mild AKI. Baseline Scr 1.2-1.4 Hold ARB and lasix.  Diastolic heart failure (HCC) Stable. BNP below her baseline. hold daily lasix due to mild AKI.  Hyperlipidemia Stable. On lipitor 40 mg daily       DVT prophylaxis: heparin injection 5,000 Units Start: 07/01/23 2315 SCDs Start: 07/01/23 2305     Code Status: Full Code Family Communication: no family at bedside Disposition Plan: return home Reason for continuing need for hospitalization: remains on IV Rocephin, IV solumedrol  Objective: Vitals:   07/02/23 1157 07/02/23 1200 07/02/23 1400 07/02/23 1415  BP:  114/66 (!) 105/55 (!) 104/53  Pulse:  63 65 (!) 53  Resp:  (!) 27 15 16   Temp: 98 F (36.7 C)     TempSrc: Oral     SpO2:  96% 96% 95%    Intake/Output Summary (Last 24 hours) at 07/02/2023 1506 Last data filed at 07/02/2023 0205 Gross per 24 hour  Intake 261.76 ml  Output --  Net 261.76 ml   There were no vitals filed for this visit.  Examination:  Physical Exam Vitals and nursing note reviewed.  Constitutional:      General: She is not in acute distress.    Appearance: She is obese. She is not toxic-appearing or diaphoretic.  HENT:     Head: Normocephalic and atraumatic.     Nose: Nose normal.  Eyes:     General: No scleral icterus. Cardiovascular:     Rate and Rhythm: Normal rate and regular rhythm.  Pulmonary:     Effort: No respiratory distress.     Breath sounds: Decreased air movement present. Examination of the right-upper field reveals wheezing. Examination of the left-upper field reveals wheezing. Examination of the right-middle field reveals wheezing. Examination of the left-middle field reveals wheezing. Examination of the right-lower field reveals wheezing. Examination of the left-lower field reveals wheezing. Wheezing present.     Comments: Audible wheezing Abdominal:     General: Abdomen is protuberant. Bowel sounds are  normal. There is no distension.     Palpations: Abdomen is soft.     Tenderness: There is no abdominal tenderness.  Musculoskeletal:     Right lower leg: No edema.     Left lower leg: No edema.     Right ankle: No swelling.     Left ankle: No swelling.     Right foot: No swelling.     Left foot: No swelling.     Comments: Veins are visible in her feet. No pedal edema.  Skin:    General: Skin is warm and dry.  Neurological:     Mental Status: She is alert.     Data Reviewed: I have personally reviewed following labs and imaging studies  CBC: Recent Labs  Lab 07/01/23 1748 07/01/23 2334 07/02/23 0259  WBC 13.3*  --  11.6*  HGB 11.7* 12.2 11.3*  HCT 37.4 36.0 37.0  MCV 90.8  --  91.4  PLT 325  --  314   Basic Metabolic Panel: Recent Labs  Lab 07/01/23 1748 07/01/23 2334 07/02/23 0259  NA 137 138 137  K 3.7 4.3 4.1  CL 99  --  100  CO2 28  --  26  GLUCOSE 143*  --  216*  BUN 12  --  21  CREATININE 1.21*  --  2.12*  CALCIUM 8.3*  --  8.3*   GFR: CrCl cannot be calculated (Unknown ideal weight.). Liver Function Tests: Recent Labs  Lab 07/02/23 0259  AST 24  ALT 18  ALKPHOS 69  BILITOT 0.5  PROT 7.6  ALBUMIN 3.1*   HbA1C: Recent Labs    07/01/23 2351  HGBA1C 5.5   CBG: Recent Labs  Lab 07/01/23 2328 07/02/23 0807 07/02/23 1159  GLUCAP 167* 163* 171*    Recent Results (from the past 240 hour(s))  Resp panel by RT-PCR (RSV, Flu A&B, Covid) Anterior Nasal Swab     Status: None   Collection Time: 07/01/23  7:43 PM   Specimen: Anterior Nasal Swab  Result Value Ref Range Status   SARS Coronavirus 2 by RT PCR NEGATIVE NEGATIVE Final   Influenza A by PCR NEGATIVE NEGATIVE Final   Influenza B by PCR NEGATIVE NEGATIVE Final    Comment: (NOTE) The Xpert Xpress SARS-CoV-2/FLU/RSV plus assay is intended as an aid in the diagnosis of influenza from Nasopharyngeal swab specimens and should not be used as a sole basis for treatment. Nasal washings  and aspirates are unacceptable for Xpert Xpress SARS-CoV-2/FLU/RSV testing.  Fact Sheet for Patients: BloggerCourse.com  Fact Sheet for Healthcare Providers: SeriousBroker.it  This test is not yet approved or cleared by the [place] FDA and has been authorized for detection and/or diagnosis of SARS-CoV-2 by FDA under an Emergency Use Authorization (EUA). This EUA will remain in effect (meaning this test can be used) for the duration of the COVID-19 declaration under Section 564(b)(1) of the Act, 21 U.S.C. section 360bbb-3(b)(1), unless the authorization is terminated or revoked.     Resp Syncytial Virus by PCR NEGATIVE NEGATIVE Final    Comment: (NOTE) Fact Sheet for Patients: BloggerCourse.com  Fact Sheet for Healthcare Providers: SeriousBroker.it  This test is not yet approved or cleared by the [place] FDA and has been authorized for detection and/or diagnosis of SARS-CoV-2 by FDA under an Emergency Use Authorization (EUA). This EUA will remain in effect (meaning this test can be used) for the duration of the COVID-19 declaration under Section 564(b)(1) of the Act, 21 U.S.C. section 360bbb-3(b)(1), unless the authorization is terminated or revoked.  Performed at [facility], [address], [place], [place] [address]   Culture, blood (Routine X 2) w Reflex to ID Panel     Status: None (Preliminary result)   Collection Time: 07/01/23 11:43 PM   Specimen: BLOOD  Result Value Ref Range Status   Specimen Description BLOOD LEFT ANTECUBITAL  Final   Special Requests   Final    BOTTLES DRAWN AEROBIC AND ANAEROBIC Blood Culture adequate volume   Culture   Final    NO GROWTH < 12 HOURS Performed at [facility], [address], [place], [place] [address]    Report Status PENDING  Incomplete  Culture, blood (Routine X 2) w Reflex to ID Panel     Status:  None (Preliminary result)   Collection Time: 07/01/23 11:48 PM   Specimen: BLOOD RIGHT HAND  Result Value Ref Range Status   Specimen Description BLOOD RIGHT HAND  Final   Special Requests   Final    BOTTLES DRAWN AEROBIC AND ANAEROBIC Blood Culture adequate volume   Culture   Final    NO GROWTH <  12 HOURS Performed at [facility], [address], [place], [place] [address]    Report Status PENDING  Incomplete     Radiology Studies: DG Chest 2 View  Result Date: 07/01/2023 CLINICAL DATA:  COPD EXAM: CHEST - 2 VIEW COMPARISON:  12/24/2022 FINDINGS: Cardiac shadow is enlarged but stable. Aortic calcifications are noted. The lungs well aerated bilaterally. Central vascular congestion is noted without significant edema. No bony abnormality is noted. IMPRESSION: Central vascular congestion without significant edema. Electronically Signed   By: Alcide Clever M.D.   On: 07/01/2023 19:53    Scheduled Meds:  amiodarone  100 mg Oral Daily   amLODipine  10 mg Oral Daily   aspirin  81 mg Oral Daily   atorvastatin  40 mg Oral Daily   azithromycin  500 mg Oral Daily   carvedilol  6.25 mg Oral BID   clopidogrel  75 mg Oral Daily   ferrous sulfate  325 mg Oral BID WC   fluticasone  1 spray Each Nare Daily   guaiFENesin  600 mg Oral BID   heparin  5,000 Units Subcutaneous Q8H   hydrALAZINE  25 mg Oral Q8H   [START ON 07/03/2023] influenza vaccine adjuvanted  0.5 mL Intramuscular Tomorrow-1000   insulin aspart  0-5 Units Subcutaneous QHS   insulin aspart  0-6 Units Subcutaneous TID WC   ipratropium-albuterol  3 mL Nebulization Q6H   isosorbide mononitrate  120 mg Oral Daily   methylPREDNISolone (SOLU-MEDROL) injection  80 mg Intravenous QHS   pantoprazole  40 mg Oral Daily   [START ON 07/03/2023] pneumococcal 20-valent conjugate vaccine  0.5 mL Intramuscular Tomorrow-1000   potassium chloride SA  20 mEq Oral Daily   sodium chloride flush  3 mL Intravenous Q12H   Continuous Infusions:   sodium chloride       LOS: 1 day   Time spent: 40 minutes  Carollee Herter, DO  [facility]  07/02/2023, 3:06 PM

## 2023-07-02 NOTE — Plan of Care (Signed)
Problem: Education: Goal: Knowledge of disease or condition will improve Outcome: Progressing Goal: Knowledge of the prescribed therapeutic regimen will improve Outcome: Progressing Goal: Individualized Educational Video(s) Outcome: Progressing   Problem: Activity: Goal: Ability to tolerate increased activity will improve Outcome: Progressing Goal: Will verbalize the importance of balancing activity with adequate rest periods Outcome: Progressing   Problem: Respiratory: Goal: Ability to maintain a clear airway will improve Outcome: Progressing Goal: Levels of oxygenation will improve Outcome: Progressing Goal: Ability to maintain adequate ventilation will improve Outcome: Progressing   Problem: Education: Goal: Ability to describe self-care measures that may prevent or decrease complications (Diabetes Survival Skills Education) will improve Outcome: Progressing Goal: Individualized Educational Video(s) Outcome: Progressing   Problem: Coping: Goal: Ability to adjust to condition or change in health will improve Outcome: Progressing   Problem: Fluid Volume: Goal: Ability to maintain a balanced intake and output will improve Outcome: Progressing   Problem: Health Behavior/Discharge Planning: Goal: Ability to identify and utilize available resources and services will improve Outcome: Progressing Goal: Ability to manage health-related needs will improve Outcome: Progressing   Problem: Metabolic: Goal: Ability to maintain appropriate glucose levels will improve Outcome: Progressing   Problem: Nutritional: Goal: Maintenance of adequate nutrition will improve Outcome: Progressing Goal: Progress toward achieving an optimal weight will improve Outcome: Progressing   Problem: Skin Integrity: Goal: Risk for impaired skin integrity will decrease Outcome: Progressing   Problem: Tissue Perfusion: Goal: Adequacy of tissue perfusion will improve Outcome: Progressing

## 2023-07-02 NOTE — Assessment & Plan Note (Addendum)
Stable. On coreg

## 2023-07-02 NOTE — Progress Notes (Signed)
New Admission Note:   Arrival Method: Stretcher  Mental Orientation: Alert and oriented Telemetry: Applied, sinus brady Assessment: Completed Skin: Intact IV: Left hand Pain: denied Tubes: none Safety Measures: Safety Fall Prevention Plan has been given, discussed and signed  [facility] Orientation: Patient has been orientated to the room, unit and staff.  Family: none at bedside  Orders have been reviewed and implemented. Will continue to monitor the patient. Call light has been placed within reach and bed alarm has been activated.   Minna Merritts RN

## 2023-07-02 NOTE — Assessment & Plan Note (Signed)
Mild AKI. Baseline Scr 1.2-1.4 Hold ARB and lasix.

## 2023-07-02 NOTE — Assessment & Plan Note (Signed)
On DAPT per neurology. Pt has not been changed to systemic anticoagulation by outpatient neurology.

## 2023-07-02 NOTE — Assessment & Plan Note (Signed)
Not normally on home O2. Wean as tolerated to keep O2 sats >92%

## 2023-07-02 NOTE — Assessment & Plan Note (Signed)
Stable. On ASA.  ?

## 2023-07-02 NOTE — Assessment & Plan Note (Signed)
Stable. On lipitor 40 mg daily.

## 2023-07-02 NOTE — Hospital Course (Signed)
HPI: Sherry Fitzpatrick is a 72 y.o. female with medical history significant of COPD, diabetes type 2, history of CAD, grade 3 diastolic heart failure, restrictive cardiomyopathy, CVA, atrial fibrillation not on anticoagulation, obstructive sleep apnea, GERD,  chronic anemia, obesity, hyperlipidemia and CKD stage 3B presented to emergency department for evaluation for presented to emergency department evaluation for shortness of breath, cold symptoms for last 2 days.  EMS reported patient O2 sat dropped to 75% on arrival.  Patient has wheezing and rhonchi.  Patient received DuoNeb, Solu-Medrol and mag sulfate en route to ED. Patient reported that she has upper respiratory tract symptoms for last 7 days which has been gradually progressed to development of productive cough and worsening shortness of breath.  She does not need to use oxygen at the baseline.  She denies any fever and chills.  Denies any nausea, vomiting abdominal pain. Patient daughter reported that she is not compliant and using inhaler at home  ED Course:  At presentation to ED patient O2 sat dropped to 85% room air.  Currently maintained 96% on 2 L. Respiratory panel negative. Elevated BNP 170. CBC showed leukocytosis 13.7 otherwise unremarkable. BMP sodium 137, potassium 3.7, chloride 99, bicarb 28, blood glucose 143, BUN 22, creatinine 1.2, calcium 8.3 and GFR 47.  Anion gap 10. Chest x-ray showed central vascular congestion without significant edema.  No evidence of pneumonia.   EKG showed sinus rhythm with premature ventricular complex heart rate 65.  No ST and T wave abnormality.   Per ED physician Dr. Damita Lack there is concern for COPD exacerbation possibly from viral upper respiratory tract infection and I also agree with ED physician evaluation.  Hospitalist has been contacted for further evaluation and management of COPD exacerbation.    Significant Events: Admitted 07/01/2023   Significant Labs: Admitting BNP  170  Significant Imaging Studies: Admitting CXR shows Central vascular congestion without significant edema.   Antibiotic Therapy: Anti-infectives (From admission, onward)    Start     Dose/Rate Route Frequency Ordered Stop   07/02/23 2315  azithromycin (ZITHROMAX) tablet 500 mg       Placed in "Followed by" Linked Group   500 mg Oral Daily 07/01/23 2305 07/06/23 0959   07/01/23 2304  azithromycin (ZITHROMAX) 500 mg in sodium chloride 0.9 % 250 mL IVPB       Placed in "Followed by" Linked Group   500 mg 250 mL/hr over 60 Minutes Intravenous Every 24 hours 07/01/23 2305 07/02/23 0202       Procedures:   Consultants:

## 2023-07-02 NOTE — Plan of Care (Signed)
Problem: Education: Goal: Knowledge of disease or condition will improve 07/02/2023 2311 by Irwin Brakeman, RN Outcome: Progressing 07/02/2023 1745 by Irwin Brakeman, RN Outcome: Progressing Goal: Knowledge of the prescribed therapeutic regimen will improve 07/02/2023 2311 by Irwin Brakeman, RN Outcome: Progressing 07/02/2023 1745 by Irwin Brakeman, RN Outcome: Progressing Goal: Individualized Educational Video(s) 07/02/2023 2311 by Irwin Brakeman, RN Outcome: Progressing 07/02/2023 1745 by Irwin Brakeman, RN Outcome: Progressing   Problem: Activity: Goal: Ability to tolerate increased activity will improve 07/02/2023 2311 by Irwin Brakeman, RN Outcome: Progressing 07/02/2023 1745 by Irwin Brakeman, RN Outcome: Progressing Goal: Will verbalize the importance of balancing activity with adequate rest periods 07/02/2023 2311 by Irwin Brakeman, RN Outcome: Progressing 07/02/2023 1745 by Irwin Brakeman, RN Outcome: Progressing   Problem: Respiratory: Goal: Ability to maintain a clear airway will improve 07/02/2023 2311 by Irwin Brakeman, RN Outcome: Progressing 07/02/2023 1745 by Irwin Brakeman, RN Outcome: Progressing Goal: Levels of oxygenation will improve 07/02/2023 2311 by Irwin Brakeman, RN Outcome: Progressing 07/02/2023 1745 by Irwin Brakeman, RN Outcome: Progressing Goal: Ability to maintain adequate ventilation will improve 07/02/2023 2311 by Irwin Brakeman, RN Outcome: Progressing 07/02/2023 1745 by Irwin Brakeman, RN Outcome: Progressing   Problem: Education: Goal: Ability to describe self-care measures that may prevent or decrease complications (Diabetes Survival Skills Education) will improve 07/02/2023 2311 by Irwin Brakeman, RN Outcome: Progressing 07/02/2023 1745 by Irwin Brakeman, RN Outcome: Progressing Goal: Individualized  Educational Video(s) 07/02/2023 2311 by Irwin Brakeman, RN Outcome: Progressing 07/02/2023 1745 by Irwin Brakeman, RN Outcome: Progressing   Problem: Coping: Goal: Ability to adjust to condition or change in health will improve 07/02/2023 2311 by Irwin Brakeman, RN Outcome: Progressing 07/02/2023 1745 by Irwin Brakeman, RN Outcome: Progressing   Problem: Fluid Volume: Goal: Ability to maintain a balanced intake and output will improve 07/02/2023 2311 by Irwin Brakeman, RN Outcome: Progressing 07/02/2023 1745 by Irwin Brakeman, RN Outcome: Progressing   Problem: Health Behavior/Discharge Planning: Goal: Ability to identify and utilize available resources and services will improve 07/02/2023 2311 by Irwin Brakeman, RN Outcome: Progressing 07/02/2023 1745 by Irwin Brakeman, RN Outcome: Progressing Goal: Ability to manage health-related needs will improve 07/02/2023 2311 by Irwin Brakeman, RN Outcome: Progressing 07/02/2023 1745 by Irwin Brakeman, RN Outcome: Progressing   Problem: Metabolic: Goal: Ability to maintain appropriate glucose levels will improve 07/02/2023 2311 by Irwin Brakeman, RN Outcome: Progressing 07/02/2023 1745 by Irwin Brakeman, RN Outcome: Progressing   Problem: Nutritional: Goal: Maintenance of adequate nutrition will improve 07/02/2023 2311 by Irwin Brakeman, RN Outcome: Progressing 07/02/2023 1745 by Irwin Brakeman, RN Outcome: Progressing Goal: Progress toward achieving an optimal weight will improve 07/02/2023 2311 by Irwin Brakeman, RN Outcome: Progressing 07/02/2023 1745 by Irwin Brakeman, RN Outcome: Progressing   Problem: Skin Integrity: Goal: Risk for impaired skin integrity will decrease 07/02/2023 2311 by Irwin Brakeman, RN Outcome: Progressing 07/02/2023 1745 by Irwin Brakeman, RN Outcome: Progressing    Problem: Tissue Perfusion: Goal: Adequacy of tissue perfusion will improve 07/02/2023 2311 by Irwin Brakeman, RN Outcome: Progressing 07/02/2023 1745 by Irwin Brakeman, RN Outcome: Progressing   Problem: Education: Goal: Knowledge of General Education information will improve Description: Including pain rating scale, medication(s)/side effects and non-pharmacologic comfort measures Outcome: Progressing   Problem: Health Behavior/Discharge Planning: Goal: Ability to manage health-related needs will improve Outcome:  Progressing   Problem: Clinical Measurements: Goal: Ability to maintain clinical measurements within normal limits will improve Outcome: Progressing Goal: Will remain free from infection Outcome: Progressing Goal: Diagnostic test results will improve Outcome: Progressing Goal: Respiratory complications will improve Outcome: Progressing Goal: Cardiovascular complication will be avoided Outcome: Progressing   Problem: Activity: Goal: Risk for activity intolerance will decrease Outcome: Progressing   Problem: Nutrition: Goal: Adequate nutrition will be maintained Outcome: Progressing   Problem: Coping: Goal: Level of anxiety will decrease Outcome: Progressing   Problem: Elimination: Goal: Will not experience complications related to bowel motility Outcome: Progressing Goal: Will not experience complications related to urinary retention Outcome: Progressing   Problem: Pain Managment: Goal: General experience of comfort will improve Outcome: Progressing   Problem: Safety: Goal: Ability to remain free from injury will improve Outcome: Progressing   Problem: Skin Integrity: Goal: Risk for impaired skin integrity will decrease Outcome: Progressing

## 2023-07-02 NOTE — Assessment & Plan Note (Addendum)
Stable. BNP below her baseline. hold daily lasix due to mild AKI.

## 2023-07-02 NOTE — Assessment & Plan Note (Signed)
Admitted for COPD exacerbation. Still wheezing this AM.  Continue with nebs and IV solumedrol. Also on Rocephin/zithromax Pt not normally on home O2. Will need home nebulizer machine at discharge. TOC consulted for DME

## 2023-07-02 NOTE — ED Notes (Signed)
ED TO INPATIENT HANDOFF REPORT  ED Nurse Name and Phone #: Percival Spanish [phone]  S Name/Age/Gender Sherry Fitzpatrick 72 y.o. female Room/Bed: 045C/045C  Code Status   Code Status: Full Code  Home/SNF/Other Home Patient oriented to: self, place, time, and situation Is this baseline? Yes   Triage Complete: Triage complete  Chief Complaint COPD with acute exacerbation (HCC) [J44.1]  Triage Note Pt to ED BIB EMS with complaint of SHOB and cold sx x2 days. No improvement with use of inhaler. EMS reports SPO2 75% on RA upon arrival to scene. Wheezing and rhonchi initially - given 2 duo nebs, 125mg  solumedrol and 2g magnesium en route. SPO2 improved to 95% after treatments and currently wearing 2L O2 via [place].   Hx of CHF and COPD    Allergies Allergies  Allergen Reactions   Morphine And Codeine Hives and Nausea And Vomiting   Penicillins Hives    Has patient had a PCN reaction causing immediate rash, facial/tongue/throat swelling, SOB or lightheadedness with hypotension: Yes Has patient had a PCN reaction causing severe rash involving mucus membranes or skin necrosis: No Has patient had a PCN reaction that required hospitalization: No Has patient had a PCN reaction occurring within the last 10 years: No If all of the above answers are "NO", then may proceed with Cephalosporin use.   Percocet [Oxycodone-Acetaminophen] Nausea And Vomiting   Ace Inhibitors Nausea And Vomiting and Cough   Iodinated Contrast Media Nausea And Vomiting        Iodine Itching   Etodolac Nausea And Vomiting   Tramadol Nausea And Vomiting   Vicodin [Hydrocodone-Acetaminophen] Nausea And Vomiting    Level of Care/Admitting Diagnosis ED Disposition     ED Disposition  Admit   Condition  --   Comment  Hospital Area: [facility] [[id number]]  Level of Care: Telemetry Medical [104]  May admit patient to [facility] or [facility] if equivalent level of care is available:: No  Covid Evaluation:  Asymptomatic - no recent exposure (last 10 days) testing not required  Diagnosis: COPD with acute exacerbation [facility]) [[id number]]  Admitting Physician: Tereasa Coop [[id number]]  Attending Physician: Tereasa Coop [[id number]]  Certification:: I certify this patient will need inpatient services for at least 2 midnights  Expected Medical Readiness: 07/05/2023          B Medical/Surgery History Past Medical History:  Diagnosis Date   Acute non-Q wave non-ST elevation myocardial infarction (NSTEMI) (HCC) 05/24/2020   Acute respiratory failure with hypoxia (HCC) 11/22/2019   AKI (acute kidney injury) (HCC) 07/03/2018   Alterations of sensations, late effect of cerebrovascular disease(438.6)    Arthritis    "all over"   Backache, unspecified    Body mass index 40.0-44.9, adult (HCC)    Carpal tunnel syndrome    Cerebral thrombosis with cerebral infarction 05/29/2020   Dizziness and giddiness    Esophageal reflux    Generalized pain    Gout attack 03/28/2020   Headache    "had alot til I had pituitary tumor removed"   Heart murmur    Hypertensive encephalopathy 03/28/2020   Mild cognitive impairment 07/16/2022   NSTEMI (non-ST elevated myocardial infarction) (HCC)    Other abnormal glucose    Other and unspecified hyperlipidemia    Other malaise and fatigue    Other postprocedural status(V45.89)    Other symptoms involving cardiovascular system    Shock (HCC)    Sleep apnea    "did test; they wanted me to get  a CPAP but I never did get it" (04/14/2015) Does use inhaler at night (01 2021)   Stroke (HCC) 06/2003; 02/2010   "minor; minor" ; denies residual on 04/14/2015   Unspecified disorder of the pituitary gland and its hypothalamic control    Unspecified essential hypertension    Unspecified visual disturbance    Past Surgical History:  Procedure Laterality Date   CARPAL TUNNEL RELEASE Left ~ 2014   CORONARY STENT INTERVENTION N/A 05/24/2020   Procedure: CORONARY STENT  INTERVENTION;  Surgeon: Corky Crafts, MD;  Location: [facility] INVASIVE CV LAB;  Service: Cardiovascular;  Laterality: N/A;   JOINT REPLACEMENT     LEFT HEART CATH AND CORONARY ANGIOGRAPHY N/A 05/24/2020   Procedure: LEFT HEART CATH AND CORONARY ANGIOGRAPHY;  Surgeon: Rinaldo Cloud, MD;  Location: [facility] INVASIVE CV LAB;  Service: Cardiovascular;  Laterality: N/A;   TOTAL HIP ARTHROPLASTY Right 11/06/2010   TRANSPHENOIDAL / TRANSNASAL HYPOPHYSECTOMY / RESECTION PITUITARY TUMOR  06/13/2012   TUBAL LIGATION  1974     A IV Location/Drains/Wounds Patient Lines/Drains/Airways Status     Active Line/Drains/Airways     Name Placement date Placement time Site Days   Peripheral IV 07/01/23 20 G Left;Posterior Hand 07/01/23  1733  Hand  1   Peripheral IV 07/01/23 20 G 1" Left Antecubital 07/01/23  2338  Antecubital  1            Intake/Output Last 24 hours  Intake/Output Summary (Last 24 hours) at 07/02/2023 1324 Last data filed at 07/02/2023 0205 Gross per 24 hour  Intake 261.76 ml  Output --  Net 261.76 ml    Labs/Imaging Results for orders placed or performed during the hospital encounter of 07/01/23 (from the past 48 hour(s))  Basic metabolic panel     Status: Abnormal   Collection Time: 07/01/23  5:48 PM  Result Value Ref Range   Sodium 137 135 - 145 mmol/L   Potassium 3.7 3.5 - 5.1 mmol/L   Chloride 99 98 - 111 mmol/L   CO2 28 22 - 32 mmol/L   Glucose, Bld 143 (H) 70 - 99 mg/dL    Comment: Glucose reference range applies only to samples taken after fasting for at least 8 hours.   BUN 12 8 - 23 mg/dL   Creatinine, Ser 4.09 (H) 0.44 - 1.00 mg/dL   Calcium 8.3 (L) 8.9 - 10.3 mg/dL   GFR, Estimated 47 (L) >60 mL/min    Comment: (NOTE) Calculated using the CKD-EPI Creatinine Equation (2021)    Anion gap 10 5 - 15    Comment: Performed at [facility], [address], [place], [place] [address]  CBC     Status: Abnormal   Collection Time: 07/01/23  5:48 PM  Result Value  Ref Range   WBC 13.3 (H) 4.0 - 10.5 K/uL   RBC 4.12 3.87 - 5.11 MIL/uL   Hemoglobin 11.7 (L) 12.0 - 15.0 g/dL   HCT 47.8 29.5 - 62.1 %   MCV 90.8 80.0 - 100.0 fL   MCH 28.4 26.0 - 34.0 pg   MCHC 31.3 30.0 - 36.0 g/dL   RDW 30.8 65.7 - 84.6 %   Platelets 325 150 - 400 K/uL   nRBC 0.0 0.0 - 0.2 %    Comment: Performed at [facility], [address] [place], [place] [address]  Brain natriuretic peptide     Status: Abnormal   Collection Time: 07/01/23  6:34 PM  Result Value Ref Range  B Natriuretic Peptide 170.6 (H) 0.0 - 100.0 pg/mL    Comment: Performed at [facility], [address] [place], [place] [address]  Resp panel by RT-PCR (RSV, Flu A&B, Covid) Anterior Nasal Swab     Status: None   Collection Time: 07/01/23  7:43 PM   Specimen: Anterior Nasal Swab  Result Value Ref Range   SARS Coronavirus 2 by RT PCR NEGATIVE NEGATIVE   Influenza A by PCR NEGATIVE NEGATIVE   Influenza B by PCR NEGATIVE NEGATIVE    Comment: (NOTE) The Xpert Xpress SARS-CoV-2/FLU/RSV plus assay is intended as an aid in the diagnosis of influenza from Nasopharyngeal swab specimens and should not be used as a sole basis for treatment. Nasal washings and aspirates are unacceptable for Xpert Xpress SARS-CoV-2/FLU/RSV testing.  Fact Sheet for Patients: BloggerCourse.com  Fact Sheet for Healthcare Providers: SeriousBroker.it  This test is not yet approved or cleared by the [place] FDA and has been authorized for detection and/or diagnosis of SARS-CoV-2 by FDA under an Emergency Use Authorization (EUA). This EUA will remain in effect (meaning this test can be used) for the duration of the COVID-19 declaration under Section 564(b)(1) of the Act, 21 U.S.C. section 360bbb-3(b)(1), unless the authorization is terminated or revoked.     Resp Syncytial Virus by PCR NEGATIVE NEGATIVE    Comment: (NOTE) Fact Sheet for  Patients: BloggerCourse.com  Fact Sheet for Healthcare Providers: SeriousBroker.it  This test is not yet approved or cleared by the [place] FDA and has been authorized for detection and/or diagnosis of SARS-CoV-2 by FDA under an Emergency Use Authorization (EUA). This EUA will remain in effect (meaning this test can be used) for the duration of the COVID-19 declaration under Section 564(b)(1) of the Act, 21 U.S.C. section 360bbb-3(b)(1), unless the authorization is terminated or revoked.  Performed at [facility], [address] [place], [place] [address]   Strep pneumoniae urinary antigen     Status: None   Collection Time: 07/01/23 10:48 PM  Result Value Ref Range   Strep Pneumo Urinary Antigen NEGATIVE NEGATIVE    Comment:        Infection due to S. pneumoniae cannot be absolutely ruled out since the antigen present may be below the detection limit of the test. Performed at [facility], [address], [place], [place] [address]   CBG monitoring, ED     Status: Abnormal   Collection Time: 07/01/23 11:28 PM  Result Value Ref Range   Glucose-Capillary 167 (H) 70 - 99 mg/dL    Comment: Glucose reference range applies only to samples taken after fasting for at least 8 hours.  I-Stat arterial blood gas, ED     Status: Abnormal   Collection Time: 07/01/23 11:34 PM  Result Value Ref Range   pH, Arterial 7.316 (L) 7.35 - 7.45   pCO2 arterial 60.9 (H) 32 - 48 mmHg   pO2, Arterial 116 (H) 83 - 108 mmHg   Bicarbonate 31.2 (H) 20.0 - 28.0 mmol/L   TCO2 33 (H) 22 - 32 mmol/L   O2 Saturation 98 %   Acid-Base Excess 3.0 (H) 0.0 - 2.0 mmol/L   Sodium 138 135 - 145 mmol/L   Potassium 4.3 3.5 - 5.1 mmol/L   Calcium, Ion 1.16 1.15 - 1.40 mmol/L   HCT 36.0 36.0 - 46.0 %   Hemoglobin 12.2 12.0 - 15.0 g/dL   Patient temperature 91.4 F    Collection site RADIAL, ALLEN'S TEST  ACCEPTABLE    Drawn by RT    Sample type  ARTERIAL   Culture, blood (Routine X 2) w Reflex to ID Panel     Status: None (Preliminary result)   Collection Time: 07/01/23 11:43 PM   Specimen: BLOOD  Result Value Ref Range   Specimen Description BLOOD LEFT ANTECUBITAL    Special Requests      BOTTLES DRAWN AEROBIC AND ANAEROBIC Blood Culture adequate volume   Culture      NO GROWTH < 12 HOURS Performed at [facility], [address], [place], [place] [address]    Report Status PENDING   Culture, blood (Routine X 2) w Reflex to ID Panel     Status: None (Preliminary result)   Collection Time: 07/01/23 11:48 PM   Specimen: BLOOD RIGHT HAND  Result Value Ref Range   Specimen Description BLOOD RIGHT HAND    Special Requests      BOTTLES DRAWN AEROBIC AND ANAEROBIC Blood Culture adequate volume   Culture      NO GROWTH < 12 HOURS Performed at [facility], [address] [place], [place] [address]    Report Status PENDING   Hemoglobin A1c     Status: None   Collection Time: 07/01/23 11:51 PM  Result Value Ref Range   Hgb A1c MFr Bld 5.5 4.8 - 5.6 %    Comment: (NOTE) Pre diabetes:          5.7%-6.4%  Diabetes:              >6.4%  Glycemic control for   <7.0% adults with diabetes    Mean Plasma Glucose 111.15 mg/dL    Comment: Performed at [facility], [address], [place], [place] [address]  Comprehensive metabolic panel     Status: Abnormal   Collection Time: 07/02/23  2:59 AM  Result Value Ref Range   Sodium 137 135 - 145 mmol/L   Potassium 4.1 3.5 - 5.1 mmol/L   Chloride 100 98 - 111 mmol/L   CO2 26 22 - 32 mmol/L   Glucose, Bld 216 (H) 70 - 99 mg/dL    Comment: Glucose reference range applies only to samples taken after fasting for at least 8 hours.   BUN 21 8 - 23 mg/dL   Creatinine, Ser 3.66 (H) 0.44 - 1.00 mg/dL   Calcium 8.3 (L) 8.9 - 10.3 mg/dL   Total Protein 7.6 6.5 - 8.1 g/dL   Albumin 3.1 (L) 3.5 - 5.0 g/dL   AST 24 15 - 41 U/L   ALT 18 0 - 44 U/L   Alkaline Phosphatase 69  38 - 126 U/L   Total Bilirubin 0.5 0.3 - 1.2 mg/dL   GFR, Estimated 24 (L) >60 mL/min    Comment: (NOTE) Calculated using the CKD-EPI Creatinine Equation (2021)    Anion gap 11 5 - 15    Comment: Performed at [facility], [address], [place], [place] [address]  CBC     Status: Abnormal   Collection Time: 07/02/23  2:59 AM  Result Value Ref Range   WBC 11.6 (H) 4.0 - 10.5 K/uL   RBC 4.05 3.87 - 5.11 MIL/uL   Hemoglobin 11.3 (L) 12.0 - 15.0 g/dL   HCT 74.2 59.5 - 63.8 %   MCV 91.4 80.0 - 100.0 fL   MCH 27.9 26.0 - 34.0 pg   MCHC 30.5 30.0 - 36.0 g/dL   RDW 75.6 (H) 43.3 -  15.5 %   Platelets 314 150 - 400 K/uL   nRBC 0.0 0.0 - 0.2 %    Comment: Performed at [facility], [address], [place], [place] [address]  CBG monitoring, ED     Status: Abnormal   Collection Time: 07/02/23  8:07 AM  Result Value Ref Range   Glucose-Capillary 163 (H) 70 - 99 mg/dL    Comment: Glucose reference range applies only to samples taken after fasting for at least 8 hours.  CBG monitoring, ED     Status: Abnormal   Collection Time: 07/02/23 11:59 AM  Result Value Ref Range   Glucose-Capillary 171 (H) 70 - 99 mg/dL    Comment: Glucose reference range applies only to samples taken after fasting for at least 8 hours.   DG Chest 2 View  Result Date: 07/01/2023 CLINICAL DATA:  COPD EXAM: CHEST - 2 VIEW COMPARISON:  12/24/2022 FINDINGS: Cardiac shadow is enlarged but stable. Aortic calcifications are noted. The lungs well aerated bilaterally. Central vascular congestion is noted without significant edema. No bony abnormality is noted. IMPRESSION: Central vascular congestion without significant edema. Electronically Signed   By: Alcide Clever M.D.   On: 07/01/2023 19:53    Pending Labs Unresulted Labs (From admission, onward)     Start     Ordered   07/02/23 0245  MRSA Next Gen by PCR, Nasal  Once,   R        07/02/23 0245   07/01/23 2248  Expectorated Sputum Assessment w Gram Stain,  Rflx to Resp Cult  Once,   R       Question Answer Comment  Patient immune status Normal   Release to patient Immediate      07/01/23 2247   07/01/23 2248  Legionella Pneumophila Serogp 1 Ur Ag  Once,   URGENT        07/01/23 2247            Vitals/Pain Today's Vitals   07/02/23 1130 07/02/23 1145 07/02/23 1157 07/02/23 1200  BP: 119/74 (!) 113/53  114/66  Pulse: 62 64  63  Resp: 17 18  (!) 27  Temp:   98 F (36.7 C)   TempSrc:   Oral   SpO2: 97% 95%  96%    Isolation Precautions No active isolations  Medications Medications  aspirin chewable tablet 81 mg (81 mg Oral Given 07/02/23 1001)  amiodarone (PACERONE) tablet 100 mg (100 mg Oral Given 07/02/23 0937)  amLODipine (NORVASC) tablet 10 mg (10 mg Oral Given 07/02/23 0939)  atorvastatin (LIPITOR) tablet 40 mg (40 mg Oral Given 07/02/23 0939)  carvedilol (COREG) tablet 6.25 mg (6.25 mg Oral Given 07/02/23 0938)  furosemide (LASIX) tablet 40 mg (has no administration in time range)  isosorbide mononitrate (IMDUR) 24 hr tablet 120 mg (120 mg Oral Given 07/02/23 0938)  irbesartan (AVAPRO) tablet 300 mg (300 mg Oral Given 07/02/23 0959)  hydrALAZINE (APRESOLINE) tablet 25 mg (25 mg Oral Given 07/02/23 0938)  pantoprazole (PROTONIX) EC tablet 40 mg (40 mg Oral Given 07/02/23 0937)  polyethylene glycol (MIRALAX / GLYCOLAX) packet 17 g (has no administration in time range)  clopidogrel (PLAVIX) tablet 75 mg (75 mg Oral Given 07/02/23 0938)  ferrous sulfate tablet 325 mg (325 mg Oral Given 07/02/23 0939)  potassium chloride SA (KLOR-CON M) CR tablet 20 mEq (20 mEq Oral Given 07/02/23 0937)  azithromycin (ZITHROMAX) 500 mg in sodium chloride 0.9 % 250 mL IVPB (0 mg Intravenous Stopped 07/02/23 0202)  Followed by  azithromycin (ZITHROMAX) tablet 500 mg (has no administration in time range)  ipratropium-albuterol (DUONEB) 0.5-2.5 (3) MG/3ML nebulizer solution 3 mL (3 mLs Nebulization Given 07/02/23 0936)  levalbuterol (XOPENEX) nebulizer  solution 0.63 mg (has no administration in time range)  heparin injection 5,000 Units (5,000 Units Subcutaneous Given 07/02/23 0940)  sodium chloride flush (NS) 0.9 % injection 3 mL (3 mLs Intravenous Given 07/02/23 1001)  sodium chloride flush (NS) 0.9 % injection 3 mL (has no administration in time range)  0.9 %  sodium chloride infusion (has no administration in time range)  senna-docusate (Senokot-S) tablet 1 tablet (has no administration in time range)  acetaminophen (TYLENOL) tablet 650 mg (has no administration in time range)    Or  acetaminophen (TYLENOL) suppository 650 mg (has no administration in time range)  ondansetron (ZOFRAN) tablet 4 mg (has no administration in time range)    Or  ondansetron (ZOFRAN) injection 4 mg (has no administration in time range)  fluticasone (FLONASE) 50 MCG/ACT nasal spray 1 spray (1 spray Each Nare Given 07/02/23 1227)  insulin aspart (novoLOG) injection 0-6 Units (1 Units Subcutaneous Given 07/02/23 1206)  insulin aspart (novoLOG) injection 0-5 Units (0 Units Subcutaneous Not Given 07/01/23 2335)  methylPREDNISolone sodium succinate (SOLU-MEDROL) 125 mg/2 mL injection 80 mg (80 mg Intravenous Given 07/02/23 0038)  guaiFENesin (MUCINEX) 12 hr tablet 600 mg (600 mg Oral Given 07/02/23 1610)    Mobility walks     Focused Assessments Pulmonary Assessment Handoff:  Lung sounds: Bilateral Breath Sounds: Expiratory wheezes L Breath Sounds: Rhonchi R Breath Sounds: Rhonchi O2 Device: Nasal Cannula O2 Flow Rate (L/min): 3 L/min    R Recommendations: See Admitting Provider Note  Report given to:   Additional Notes:

## 2023-07-03 DIAGNOSIS — J441 Chronic obstructive pulmonary disease with (acute) exacerbation: Secondary | ICD-10-CM | POA: Diagnosis not present

## 2023-07-03 LAB — GLUCOSE, CAPILLARY
Glucose-Capillary: 135 mg/dL — ABNORMAL HIGH (ref 70–99)
Glucose-Capillary: 131 mg/dL — ABNORMAL HIGH (ref 70–99)

## 2023-07-03 MED ORDER — MOMETASONE FURO-FORMOTEROL FUM 200-5 MCG/ACT IN AERO
2.0000 | INHALATION_SPRAY | Freq: Two times a day (BID) | RESPIRATORY_TRACT | Status: DC
  Administered 2023-07-03 – 2023-07-09 (×13): 2 via RESPIRATORY_TRACT
  Filled 2023-07-03: qty 8.8

## 2023-07-03 MED ORDER — IPRATROPIUM-ALBUTEROL 0.5-2.5 (3) MG/3ML IN SOLN
3.0000 mL | Freq: Two times a day (BID) | RESPIRATORY_TRACT | Status: DC
  Administered 2023-07-03 – 2023-07-04 (×2): 3 mL via RESPIRATORY_TRACT
  Filled 2023-07-03 (×2): qty 3

## 2023-07-03 NOTE — Progress Notes (Signed)
PROGRESS NOTE    Sherry Fitzpatrick  OZH:[id number] DOB: 12-Apr-1951 DOA: 07/01/2023 PCP: Shon Hale, MD   Brief Narrative:   72 y.o. female with medical history significant of COPD, diabetes type 2, history of CAD, grade 3 diastolic heart failure, restrictive cardiomyopathy, CVA, atrial fibrillation not on anticoagulation, obstructive sleep apnea, GERD,  chronic anemia, obesity, hyperlipidemia and CKD stage 3B presented with worsening shortness of breath.  On presentation, she was saturating 75% on room air along with wheezing.  Chest x-ray showed central vascular congestion without significant edema.  COVID-19/influenza/RSV PCR were negative.  She was started on IV Solu-Medrol.  Assessment & Plan:   COPD exacerbation Acute respiratory failure with hypoxia -Still requiring 2 L oxygen by nasal cannula.  Wean off as able.  Chest x-ray as above.  COVID-19/influenza/RSV PCR negative on admission. -Continue Solu-Medrol.  Add Dulera.  Continue nebs.  Continue Zithromax.  Chronic diastolic heart failure/restrictive cardiomyopathy Hypertension -Strict input and output.  Daily weights.  Fluid restriction.  Lasix on hold because of AKI.  Continue Imdur, hydralazine, Coreg.  Continue amlodipine. -Outpatient follow-up with cardiology  AKI on CKD stage IIIb -Labs pending for today.  Baseline creatinine of 1.2-1.4.  Monitor Lasix on hold.  History of coronary artery disease Hyperlipidemia -Stable.  Continue aspirin, Lipitor and Coreg  Paroxysmal A-fib -Not on anticoagulation as an outpatient.  Continue amiodarone and Coreg.  Outpatient follow-up with cardiology  Diabetes mellitus type 2 with hyperglycemia -Continue CBGs with SSI  Anemia of chronic disease -Hemoglobin stable.  Monitor intermittently.  Leukocytosis -Most likely reactive.  Monitor intermittently.  History of obstructive sleep apnea -Stable.  Outpatient follow-up  Morbid obesity -Outpatient follow-up  Physical  deconditioning -PT eval   DVT prophylaxis: Subcutaneous heparin Code Status: Full Family Communication: None at bedside Disposition Plan: Status is: Inpatient Remains inpatient appropriate because: Of severity of illness    Consultants: None  Procedures: None  Antimicrobials:  Anti-infectives (From admission, onward)    Start     Dose/Rate Route Frequency Ordered Stop   07/02/23 2315  azithromycin (ZITHROMAX) tablet 500 mg       Placed in "Followed by" Linked Group   500 mg Oral Daily 07/01/23 2305 07/06/23 0959   07/01/23 2304  azithromycin (ZITHROMAX) 500 mg in sodium chloride 0.9 % 250 mL IVPB       Placed in "Followed by" Linked Group   500 mg 250 mL/hr over 60 Minutes Intravenous Every 24 hours 07/01/23 2305 07/02/23 0202        Subjective: Patient seen and examined at bedside.  Feels slightly better.  Still feels short of breath with minimal exertion with cough.  No fever or vomiting reported.  Objective: Vitals:   07/03/23 0205 07/03/23 0411 07/03/23 0801 07/03/23 0901  BP:  133/64  124/67  Pulse: 66 65 (!) 58 60  Resp: 20  20   Temp:  98.3 F (36.8 C)  97.7 F (36.5 C)  TempSrc:  Oral  Oral  SpO2: 98% 94% 96% 99%  Weight:      Height:        Intake/Output Summary (Last 24 hours) at 07/03/2023 0943 Last data filed at 07/03/2023 0923 Gross per 24 hour  Intake 740 ml  Output 0 ml  Net 740 ml   Filed Weights   07/02/23 1525  Weight: 110.5 kg    Examination:  General exam: Appears calm and comfortable.  Elderly female lying in bed.  On 2 L oxygen via nasal cannula.  Respiratory system: Bilateral decreased breath sounds at bases with scattered wheezing Cardiovascular system: S1 & S2 heard, Rate controlled Gastrointestinal system: Abdomen is morbidly obese nondistended, soft and nontender. Normal bowel sounds heard. Extremities: No cyanosis, clubbing; trace lower extremity edema  Central nervous system: Alert and oriented.  Slow to respond.  No  focal neurological deficits. Moving extremities Skin: No rashes, lesions or ulcers Psychiatry: Flat affect.  Not agitated.   Data Reviewed: I have personally reviewed following labs and imaging studies  CBC: Recent Labs  Lab 07/01/23 1748 07/01/23 2334 07/02/23 0259  WBC 13.3*  --  11.6*  HGB 11.7* 12.2 11.3*  HCT 37.4 36.0 37.0  MCV 90.8  --  91.4  PLT 325  --  314   Basic Metabolic Panel: Recent Labs  Lab 07/01/23 1748 07/01/23 2334 07/02/23 0259  NA 137 138 137  K 3.7 4.3 4.1  CL 99  --  100  CO2 28  --  26  GLUCOSE 143*  --  216*  BUN 12  --  21  CREATININE 1.21*  --  2.12*  CALCIUM 8.3*  --  8.3*   GFR: Estimated Creatinine Clearance: 28.3 mL/min (A) (by C-G formula based on SCr of 2.12 mg/dL (H)). Liver Function Tests: Recent Labs  Lab 07/02/23 0259  AST 24  ALT 18  ALKPHOS 69  BILITOT 0.5  PROT 7.6  ALBUMIN 3.1*   No results for input(s): "LIPASE", "AMYLASE" in the last 168 hours. No results for input(s): "AMMONIA" in the last 168 hours. Coagulation Profile: No results for input(s): "INR", "PROTIME" in the last 168 hours. Cardiac Enzymes: No results for input(s): "CKTOTAL", "CKMB", "CKMBINDEX", "TROPONINI" in the last 168 hours. BNP (last 3 results) No results for input(s): "PROBNP" in the last 8760 hours. HbA1C: Recent Labs    07/01/23 2351  HGBA1C 5.5   CBG: Recent Labs  Lab 07/01/23 2328 07/02/23 0807 07/02/23 1159 07/02/23 1540 07/02/23 2051  GLUCAP 167* 163* 171* 164* 154*   Lipid Profile: No results for input(s): "CHOL", "HDL", "LDLCALC", "TRIG", "CHOLHDL", "LDLDIRECT" in the last 72 hours. Thyroid Function Tests: No results for input(s): "TSH", "T4TOTAL", "FREET4", "T3FREE", "THYROIDAB" in the last 72 hours. Anemia Panel: No results for input(s): "VITAMINB12", "FOLATE", "FERRITIN", "TIBC", "IRON", "RETICCTPCT" in the last 72 hours. Sepsis Labs: No results for input(s): "PROCALCITON", "LATICACIDVEN" in the last 168  hours.  Recent Results (from the past 240 hour(s))  Resp panel by RT-PCR (RSV, Flu A&B, Covid) Anterior Nasal Swab     Status: None   Collection Time: 07/01/23  7:43 PM   Specimen: Anterior Nasal Swab  Result Value Ref Range Status   SARS Coronavirus 2 by RT PCR NEGATIVE NEGATIVE Final   Influenza A by PCR NEGATIVE NEGATIVE Final   Influenza B by PCR NEGATIVE NEGATIVE Final    Comment: (NOTE) The Xpert Xpress SARS-CoV-2/FLU/RSV plus assay is intended as an aid in the diagnosis of influenza from Nasopharyngeal swab specimens and should not be used as a sole basis for treatment. Nasal washings and aspirates are unacceptable for Xpert Xpress SARS-CoV-2/FLU/RSV testing.  Fact Sheet for Patients: BloggerCourse.com  Fact Sheet for Healthcare Providers: SeriousBroker.it  This test is not yet approved or cleared by the [place] FDA and has been authorized for detection and/or diagnosis of SARS-CoV-2 by FDA under an Emergency Use Authorization (EUA). This EUA will remain in effect (meaning this test can be used) for the duration of the COVID-19 declaration under Section 564(b)(1) of the Act,  21 U.S.C. section 360bbb-3(b)(1), unless the authorization is terminated or revoked.     Resp Syncytial Virus by PCR NEGATIVE NEGATIVE Final    Comment: (NOTE) Fact Sheet for Patients: BloggerCourse.com  Fact Sheet for Healthcare Providers: SeriousBroker.it  This test is not yet approved or cleared by the [place] FDA and has been authorized for detection and/or diagnosis of SARS-CoV-2 by FDA under an Emergency Use Authorization (EUA). This EUA will remain in effect (meaning this test can be used) for the duration of the COVID-19 declaration under Section 564(b)(1) of the Act, 21 U.S.C. section 360bbb-3(b)(1), unless the authorization is terminated or revoked.  Performed at [facility], [address] [place], [place] [address]   Culture, blood (Routine X 2) w Reflex to ID Panel     Status: None (Preliminary result)   Collection Time: 07/01/23 11:43 PM   Specimen: BLOOD  Result Value Ref Range Status   Specimen Description BLOOD LEFT ANTECUBITAL  Final   Special Requests   Final    BOTTLES DRAWN AEROBIC AND ANAEROBIC Blood Culture adequate volume   Culture   Final    NO GROWTH < 12 HOURS Performed at [facility], [address], [place], [place] [address]    Report Status PENDING  Incomplete  Culture, blood (Routine X 2) w Reflex to ID Panel     Status: None (Preliminary result)   Collection Time: 07/01/23 11:48 PM   Specimen: BLOOD RIGHT HAND  Result Value Ref Range Status   Specimen Description BLOOD RIGHT HAND  Final   Special Requests   Final    BOTTLES DRAWN AEROBIC AND ANAEROBIC Blood Culture adequate volume   Culture   Final    NO GROWTH < 12 HOURS Performed at [facility], [address], [place], [place] [address]    Report Status PENDING  Incomplete         Radiology Studies: DG Chest 2 View  Result Date: 07/01/2023 CLINICAL DATA:  COPD EXAM: CHEST - 2 VIEW COMPARISON:  12/24/2022 FINDINGS: Cardiac shadow is enlarged but stable. Aortic calcifications are noted. The lungs well aerated bilaterally. Central vascular congestion is noted without significant edema. No bony abnormality is noted. IMPRESSION: Central vascular congestion without significant edema. Electronically Signed   By: Alcide Clever M.D.   On: 07/01/2023 19:53        Scheduled Meds:  amiodarone  100 mg Oral Daily   amLODipine  10 mg Oral Daily   aspirin  81 mg Oral Daily   atorvastatin  40 mg Oral Daily   azithromycin  500 mg Oral Daily   carvedilol  6.25 mg Oral BID   clopidogrel  75 mg Oral Daily   ferrous sulfate  325 mg Oral BID WC   fluticasone  1 spray Each Nare Daily   guaiFENesin  600 mg Oral BID   heparin  5,000 Units Subcutaneous Q8H    hydrALAZINE  25 mg Oral Q8H   influenza vaccine adjuvanted  0.5 mL Intramuscular Tomorrow-1000   insulin aspart  0-5 Units Subcutaneous QHS   insulin aspart  0-6 Units Subcutaneous TID WC   ipratropium-albuterol  3 mL Nebulization BID   isosorbide mononitrate  120 mg Oral Daily   methylPREDNISolone (SOLU-MEDROL) injection  80 mg Intravenous QHS   mometasone-formoterol  2 puff Inhalation BID   pantoprazole  40 mg Oral Daily   pneumococcal 20-valent conjugate vaccine  0.5 mL Intramuscular Tomorrow-1000   potassium chloride SA  20 mEq Oral Daily   sodium chloride flush  3 mL Intravenous Q12H   Continuous Infusions:  sodium chloride            Glade Lloyd, MD [facility] 07/03/2023, 9:43 AM

## 2023-07-03 NOTE — TOC CM/SW Note (Signed)
Transition of Care [facility]) - Inpatient Brief Assessment   Patient Details  Name: Sherry Fitzpatrick MRN: [id number] Date of Birth: August 18, 1951  [facility]) CM/SW Contact:    Tom-Johnson, Hershal Coria, RN Phone Number: 07/03/2023, 11:12 AM   Clinical Narrative:  Patient presented to the ED with SOB, Wheezing and Cold Symptoms.  Admitted with COPD Exacerbation. Patient has Hx of COPD and CHF. Started on IV Solumedrol, Nebs, Inhalers and Oral Abx. Patient states she does not use home O2 nor CPAP at night.   From home with daughter, [name]. Has two supportive children.  Candace assists with patient's care at home.  Patient states she drives sometimes and Candace drives when she is unable to.  Has a walker and shower seat at home.  PCP is Shon Hale, MD and uses [facility] on [address]   Awaits PT/OT eval.  CM will continue to follow as patient progresses with care towards discharge.         Transition of Care Asessment: Insurance and Status: Insurance coverage has been reviewed Patient has primary care physician: Yes Home environment has been reviewed: Yes Prior level of function:: Modified Independent, uses walker at times. Prior/Current Home Services: No current home services Social Determinants of Health Reivew: SDOH reviewed no interventions necessary Readmission risk has been reviewed: Yes Transition of care needs: transition of care needs identified, TOC will continue to follow

## 2023-07-04 ENCOUNTER — Inpatient Hospital Stay (HOSPITAL_COMMUNITY): Payer: 59

## 2023-07-04 DIAGNOSIS — J441 Chronic obstructive pulmonary disease with (acute) exacerbation: Secondary | ICD-10-CM | POA: Diagnosis not present

## 2023-07-04 LAB — BASIC METABOLIC PANEL
Anion gap: 9 (ref 5–15)
Anion gap: 12 (ref 5–15)
BUN: 77 mg/dL — ABNORMAL HIGH (ref 8–23)
BUN: 90 mg/dL — ABNORMAL HIGH (ref 8–23)
CO2: 26 mmol/L (ref 22–32)
CO2: 23 mmol/L (ref 22–32)
Calcium: 8 mg/dL — ABNORMAL LOW (ref 8.9–10.3)
Calcium: 7.8 mg/dL — ABNORMAL LOW (ref 8.9–10.3)
Chloride: 100 mmol/L (ref 98–111)
Chloride: 101 mmol/L (ref 98–111)
Creatinine, Ser: 4.65 mg/dL — ABNORMAL HIGH (ref 0.44–1.00)
Creatinine, Ser: 4.96 mg/dL — ABNORMAL HIGH (ref 0.44–1.00)
GFR, Estimated: 9 mL/min — ABNORMAL LOW (ref >60)
GFR, Estimated: 9 mL/min — ABNORMAL LOW (ref >60)
Glucose, Bld: 165 mg/dL — ABNORMAL HIGH (ref 70–99)
Glucose, Bld: 112 mg/dL — ABNORMAL HIGH (ref 70–99)
Potassium: 4.9 mmol/L (ref 3.5–5.1)
Potassium: 5.1 mmol/L (ref 3.5–5.1)
Sodium: 135 mmol/L (ref 135–145)
Sodium: 136 mmol/L (ref 135–145)

## 2023-07-04 LAB — GLUCOSE, CAPILLARY
Glucose-Capillary: 147 mg/dL — ABNORMAL HIGH (ref 70–99)
Glucose-Capillary: 171 mg/dL — ABNORMAL HIGH (ref 70–99)
Glucose-Capillary: 118 mg/dL — ABNORMAL HIGH (ref 70–99)
Glucose-Capillary: 123 mg/dL — ABNORMAL HIGH (ref 70–99)

## 2023-07-04 MED ORDER — FUROSEMIDE 10 MG/ML IJ SOLN
40.0000 mg | Freq: Once | INTRAMUSCULAR | Status: AC
  Administered 2023-07-04: 40 mg via INTRAVENOUS
  Filled 2023-07-04: qty 4

## 2023-07-04 MED ORDER — SODIUM CHLORIDE 0.9 % IV SOLN: INTRAVENOUS | Status: DC

## 2023-07-04 NOTE — Progress Notes (Addendum)
PROGRESS NOTE    Sherry Fitzpatrick  ZOX:[id number] DOB: 1950-12-22 DOA: 07/01/2023 PCP: Shon Hale, MD   Brief Narrative:   72 y.o. female with medical history significant of COPD, diabetes type 2, history of CAD, grade 3 diastolic heart failure, restrictive cardiomyopathy, CVA, atrial fibrillation not on anticoagulation, obstructive sleep apnea, GERD,  chronic anemia, obesity, hyperlipidemia and CKD stage 3B presented with worsening shortness of breath.  On presentation, she was saturating 75% on room air along with wheezing.  Chest x-ray showed central vascular congestion without significant edema.  COVID-19/influenza/RSV PCR were negative.  She was started on IV Solu-Medrol.  Assessment & Plan:   COPD exacerbation Acute respiratory failure with hypoxia -Chest x-ray as above.  COVID-19/influenza/RSV PCR negative on admission. -Continue Solu-Medrol and Dulera.  Continue nebs.  Continue Zithromax. -Respiratory status improving.  Currently on room air.  Chronic diastolic heart failure/restrictive cardiomyopathy Hypertension -Echo on 12/16/2022 EF of 65 to 70% with grade 3 diastolic dysfunction.  Strict input and output.  Daily weights.  Fluid restriction.  Lasix on hold because of AKI.  Continue Imdur, hydralazine, Coreg.  Continue amlodipine. -Outpatient follow-up with cardiology  AKI on CKD stage IIIb -Baseline creatinine of 1.2-1.6.  Monitor.  Lasix on hold. -Creatinine has worsened to 4.65 today.  Renal ultrasound.  Start IV fluids.  History of coronary artery disease Hyperlipidemia -Stable.  Continue aspirin, Lipitor and Coreg  Paroxysmal A-fib -Not on anticoagulation as an outpatient.  Continue amiodarone and Coreg.  Outpatient follow-up with cardiology  Diabetes mellitus type 2 with hyperglycemia -Continue CBGs with SSI  Anemia of chronic disease -Hemoglobin stable.  Monitor intermittently.  Leukocytosis -Most likely reactive.  Monitor   History of obstructive  sleep apnea -Stable.  Outpatient follow-up  Morbid obesity -Outpatient follow-up  Physical deconditioning -PT eval   DVT prophylaxis: Subcutaneous heparin Code Status: Full Family Communication: Spoke to daughter/Anita on phone Disposition Plan: Status is: Inpatient Remains inpatient appropriate because: Of severity of illness    Consultants: None  Procedures: None  Antimicrobials:  Anti-infectives (From admission, onward)    Start     Dose/Rate Route Frequency Ordered Stop   07/02/23 2315  azithromycin (ZITHROMAX) tablet 500 mg       Placed in "Followed by" Linked Group   500 mg Oral Daily 07/01/23 2305 07/06/23 0959   07/01/23 2304  azithromycin (ZITHROMAX) 500 mg in sodium chloride 0.9 % 250 mL IVPB       Placed in "Followed by" Linked Group   500 mg 250 mL/hr over 60 Minutes Intravenous Every 24 hours 07/01/23 2305 07/02/23 0202        Subjective: Patient seen and examined at bedside.  Still short of breath with minimal exertion.  Denies any fever.  No vomiting or worsening abdominal pain reported. Objective: Vitals:   07/03/23 1744 07/03/23 2051 07/03/23 2311 07/04/23 0429  BP: 125/62 (!) 126/59 (!) 103/56 (!) 111/51  Pulse: (!) 54 (!) 55 (!) 54 (!) 55  Resp: 16 20  15   Temp: (!) 97.5 F (36.4 C) 97.6 F (36.4 C)  98.7 F (37.1 C)  TempSrc:  Oral    SpO2: 96% 99%  90%  Weight:    112.7 kg  Height:        Intake/Output Summary (Last 24 hours) at 07/04/2023 0746 Last data filed at 07/04/2023 0600 Gross per 24 hour  Intake 480 ml  Output 0 ml  Net 480 ml   Filed Weights   07/02/23 1525 07/04/23 0429  Weight: 110.5 kg 112.7 kg    Examination:  General: On room air.  No distress.  Chronically ill and deconditioned looking. ENT/neck: No thyromegaly.  JVD is not elevated  respiratory: Decreased breath sounds at bases bilaterally with some crackles; scattered wheezing  CVS: S1-S2 heard, mild intermittent bradycardia present Abdominal: Soft,  morbidly obese, nontender, slightly distended; no organomegaly, normal bowel sounds are heard Extremities: Trace lower extremity edema; no cyanosis  CNS: Awake and alert; still slow to respond.  No focal neurologic deficit.  Moves extremities Lymph: No obvious lymphadenopathy Skin: No obvious ecchymosis/lesions  psych: Mostly flat affect.  Currently not agitated.  Musculoskeletal: No obvious joint swelling/deformity    Data Reviewed: I have personally reviewed following labs and imaging studies  CBC: Recent Labs  Lab 07/01/23 1748 07/01/23 2334 07/02/23 0259 07/03/23 0926  WBC 13.3*  --  11.6* 19.6*  NEUTROABS  --   --   --  17.5*  HGB 11.7* 12.2 11.3* 11.2*  HCT 37.4 36.0 37.0 35.6*  MCV 90.8  --  91.4 93.2  PLT 325  --  314 337   Basic Metabolic Panel: Recent Labs  Lab 07/01/23 1748 07/01/23 2334 07/02/23 0259 07/03/23 0926 07/04/23 0437  NA 137 138 137 138 135  K 3.7 4.3 4.1 4.5 4.9  CL 99  --  100 100 100  CO2 28  --  26 25 26   GLUCOSE 143*  --  216* 162* 165*  BUN 12  --  21 60* 77*  CREATININE 1.21*  --  2.12* 3.96* 4.65*  CALCIUM 8.3*  --  8.3* 8.5* 8.0*  MG  --   --   --  2.7*  --    GFR: Estimated Creatinine Clearance: 13.1 mL/min (A) (by C-G formula based on SCr of 4.65 mg/dL (H)). Liver Function Tests: Recent Labs  Lab 07/02/23 0259 07/03/23 0926  AST 24 16  ALT 18 17  ALKPHOS 69 65  BILITOT 0.5 0.2*  PROT 7.6 7.3  ALBUMIN 3.1* 3.1*   No results for input(s): "LIPASE", "AMYLASE" in the last 168 hours. No results for input(s): "AMMONIA" in the last 168 hours. Coagulation Profile: No results for input(s): "INR", "PROTIME" in the last 168 hours. Cardiac Enzymes: No results for input(s): "CKTOTAL", "CKMB", "CKMBINDEX", "TROPONINI" in the last 168 hours. BNP (last 3 results) No results for input(s): "PROBNP" in the last 8760 hours. HbA1C: Recent Labs    07/01/23 2351  HGBA1C 5.5   CBG: Recent Labs  Lab 07/02/23 1540 07/02/23 2051  07/03/23 1611 07/03/23 2124 07/04/23 0731  GLUCAP 164* 154* 135* 131* 147*   Lipid Profile: No results for input(s): "CHOL", "HDL", "LDLCALC", "TRIG", "CHOLHDL", "LDLDIRECT" in the last 72 hours. Thyroid Function Tests: No results for input(s): "TSH", "T4TOTAL", "FREET4", "T3FREE", "THYROIDAB" in the last 72 hours. Anemia Panel: No results for input(s): "VITAMINB12", "FOLATE", "FERRITIN", "TIBC", "IRON", "RETICCTPCT" in the last 72 hours. Sepsis Labs: Recent Labs  Lab 07/03/23 0926  PROCALCITON 0.23    Recent Results (from the past 240 hour(s))  Resp panel by RT-PCR (RSV, Flu A&B, Covid) Anterior Nasal Swab     Status: None   Collection Time: 07/01/23  7:43 PM   Specimen: Anterior Nasal Swab  Result Value Ref Range Status   SARS Coronavirus 2 by RT PCR NEGATIVE NEGATIVE Final   Influenza A by PCR NEGATIVE NEGATIVE Final   Influenza B by PCR NEGATIVE NEGATIVE Final    Comment: (NOTE) The Xpert Xpress SARS-CoV-2/FLU/RSV plus assay is  intended as an aid in the diagnosis of influenza from Nasopharyngeal swab specimens and should not be used as a sole basis for treatment. Nasal washings and aspirates are unacceptable for Xpert Xpress SARS-CoV-2/FLU/RSV testing.  Fact Sheet for Patients: BloggerCourse.com  Fact Sheet for Healthcare Providers: SeriousBroker.it  This test is not yet approved or cleared by the [place] FDA and has been authorized for detection and/or diagnosis of SARS-CoV-2 by FDA under an Emergency Use Authorization (EUA). This EUA will remain in effect (meaning this test can be used) for the duration of the COVID-19 declaration under Section 564(b)(1) of the Act, 21 U.S.C. section 360bbb-3(b)(1), unless the authorization is terminated or revoked.     Resp Syncytial Virus by PCR NEGATIVE NEGATIVE Final    Comment: (NOTE) Fact Sheet for Patients: BloggerCourse.com  Fact Sheet  for Healthcare Providers: SeriousBroker.it  This test is not yet approved or cleared by the [place] FDA and has been authorized for detection and/or diagnosis of SARS-CoV-2 by FDA under an Emergency Use Authorization (EUA). This EUA will remain in effect (meaning this test can be used) for the duration of the COVID-19 declaration under Section 564(b)(1) of the Act, 21 U.S.C. section 360bbb-3(b)(1), unless the authorization is terminated or revoked.  Performed at [facility], [address], [place], [place] [address]   Culture, blood (Routine X 2) w Reflex to ID Panel     Status: None (Preliminary result)   Collection Time: 07/01/23 11:43 PM   Specimen: BLOOD  Result Value Ref Range Status   Specimen Description BLOOD LEFT ANTECUBITAL  Final   Special Requests   Final    BOTTLES DRAWN AEROBIC AND ANAEROBIC Blood Culture adequate volume   Culture   Final    NO GROWTH 1 DAY Performed at [facility], [address] [place], [place] [address]    Report Status PENDING  Incomplete  Culture, blood (Routine X 2) w Reflex to ID Panel     Status: None (Preliminary result)   Collection Time: 07/01/23 11:48 PM   Specimen: BLOOD RIGHT HAND  Result Value Ref Range Status   Specimen Description BLOOD RIGHT HAND  Final   Special Requests   Final    BOTTLES DRAWN AEROBIC AND ANAEROBIC Blood Culture adequate volume   Culture   Final    NO GROWTH 1 DAY Performed at [facility], [address] [place], [place] [address]    Report Status PENDING  Incomplete         Radiology Studies: No results found.      Scheduled Meds:  amiodarone  100 mg Oral Daily   amLODipine  10 mg Oral Daily   aspirin  81 mg Oral Daily   atorvastatin  40 mg Oral Daily   azithromycin  500 mg Oral Daily   carvedilol  6.25 mg Oral BID   clopidogrel  75 mg Oral Daily   ferrous sulfate  325 mg Oral BID WC   fluticasone  1 spray Each Nare Daily    guaiFENesin  600 mg Oral BID   heparin  5,000 Units Subcutaneous Q8H   hydrALAZINE  25 mg Oral Q8H   insulin aspart  0-5 Units Subcutaneous QHS   insulin aspart  0-6 Units Subcutaneous TID WC   ipratropium-albuterol  3 mL Nebulization BID   isosorbide mononitrate  120 mg Oral Daily   methylPREDNISolone (SOLU-MEDROL) injection  80 mg Intravenous QHS   mometasone-formoterol  2 puff Inhalation BID  pantoprazole  40 mg Oral Daily   potassium chloride SA  20 mEq Oral Daily   sodium chloride flush  3 mL Intravenous Q12H   Continuous Infusions:  sodium chloride            Glade Lloyd, MD [facility] 07/04/2023, 7:46 AM

## 2023-07-04 NOTE — Evaluation (Signed)
Physical Therapy Evaluation Patient Details Name: Sherry Fitzpatrick MRN: [id number] DOB: [birth year] Today's Date: 07/04/2023  History of Present Illness  72 y.o. female who presents on 07/01/23 with worsening SOB and saturation 75% with wheezing. PMH: cognitive impairment, diabetes, diastolic CHF, stroke, A-fib, OSA, GERD, anemia, obesity, hyperlipidemia, CKD 3, asthma, anxiety, depression  Clinical Impression  Pt admitted with above diagnosis. Pt from home with her daughter who works in the day. Other daughter present on eval and reports that pt mobilizes minimally at home. On eval, pt lethargic, slow with mobility and responses, and kept saying that she was so tired. SPO2 dropped to 87% in supine and with standing mobility, returned to 90's on 2L via [place]. Pt needed min A to come to standing. Pt with increased coughing with transitions in mobility. Educated pt and daughter on use of IS. Currently recommending HHPT at d/c.  Pt currently with functional limitations due to the deficits listed below (see PT Problem List). Pt will benefit from acute skilled PT to increase their independence and safety with mobility to allow discharge.           If plan is discharge home, recommend the following: A little help with walking and/or transfers;A little help with bathing/dressing/bathroom;Assistance with cooking/housework;Direct supervision/assist for medications management;Direct supervision/assist for financial management;Assist for transportation;Help with stairs or ramp for entrance   Can travel by private vehicle        Equipment Recommendations None recommended by PT  Recommendations for Other Services  OT consult    Functional Status Assessment Patient has had a recent decline in their functional status and demonstrates the ability to make significant improvements in function in a reasonable and predictable amount of time.     Precautions / Restrictions Precautions Precautions:  Fall Restrictions Weight Bearing Restrictions: No      Mobility  Bed Mobility Overal bed mobility: Needs Assistance Bed Mobility: Supine to Sit, Sit to Supine     Supine to sit: Min assist Sit to supine: Contact guard assist   General bed mobility comments: pt needed assist with shifting wt from SL to sit and min A to square hips EOB. ABle to return to supine without physical assist    Transfers Overall transfer level: Needs assistance Equipment used: Rolling walker (2 wheels) Transfers: Sit to/from Stand Sit to Stand: Min assist           General transfer comment: attempted to stand 3x and was unable without physical assist. With min A given for initiation of sit>stand pt was able to rise to RW.    Ambulation/Gait               General Gait Details: deferred due to fatigue and lethargy  Stairs            Wheelchair Mobility     Tilt Bed    Modified Rankin (Stroke Patients Only)       Balance Overall balance assessment: Needs assistance Sitting-balance support: Feet supported Sitting balance-Leahy Scale: Fair     Standing balance support: Bilateral upper extremity supported, During functional activity, Reliant on assistive device for balance Standing balance-Leahy Scale: Poor Standing balance comment: heavy reliance on RW                             Pertinent Vitals/Pain Pain Assessment Pain Assessment: No/denies pain    Home Living Family/patient expects to be discharged to:: Private residence Living Arrangements: Children Available  Help at Discharge: Family;Available PRN/intermittently Type of Home: House Home Access: Stairs to enter Entrance Stairs-Rails: None Entrance Stairs-Number of Steps: 3 Alternate Level Stairs-Number of Steps: 2 (2 steps to bedroom) Home Layout: Two level Home Equipment: Agricultural consultant (2 wheels);Rollator (4 wheels) Additional Comments: lives with her daughter    Prior Function Prior Level of  Function : Independent/Modified Independent             Mobility Comments: has been using rollator ADLs Comments: Pt reports independent in ADL and that daughter and son in law assist with IADL.     Extremity/Trunk Assessment   Upper Extremity Assessment Upper Extremity Assessment: Generalized weakness    Lower Extremity Assessment Lower Extremity Assessment: Generalized weakness    Cervical / Trunk Assessment Cervical / Trunk Assessment: Other exceptions Cervical / Trunk Exceptions: morbid obesity  Communication   Communication Communication: No apparent difficulties  Cognition Arousal: Lethargic Behavior During Therapy: Flat affect Overall Cognitive Status: Impaired/Different from baseline Area of Impairment: Problem solving, Following commands                       Following Commands: Follows multi-step commands with increased time, Follows one step commands consistently     Problem Solving: Slow processing General Comments: needs increased time to process. Keeps saying she is just so tired. Frequent coughing with transitional mvmts which seem to wear her out more        General Comments General comments (skin integrity, edema, etc.): SPO2 dropped to 87% in supine and standing on RA. Returned to 90's on 2L O2. HR 57bpm    Exercises     Assessment/Plan    PT Assessment Patient needs continued PT services  PT Problem List Cardiopulmonary status limiting activity;Decreased strength;Decreased activity tolerance;Decreased balance;Decreased mobility;Decreased knowledge of use of DME;Decreased knowledge of precautions;Obesity       PT Treatment Interventions DME instruction;Gait training;Stair training;Functional mobility training;Therapeutic activities;Therapeutic exercise;Balance training;Patient/family education    PT Goals (Current goals can be found in the Care Plan section)  Acute Rehab PT Goals Patient Stated Goal: return home PT Goal  Formulation: With patient Time For Goal Achievement: 07/18/23 Potential to Achieve Goals: Fair    Frequency Min 1X/week     Co-evaluation               AM-PAC PT "6 Clicks" Mobility  Outcome Measure Help needed turning from your back to your side while in a flat bed without using bedrails?: None Help needed moving from lying on your back to sitting on the side of a flat bed without using bedrails?: A Little Help needed moving to and from a bed to a chair (including a wheelchair)?: A Little Help needed standing up from a chair using your arms (e.g., wheelchair or bedside chair)?: A Little Help needed to walk in hospital room?: Total Help needed climbing 3-5 steps with a railing? : Total 6 Click Score: 15    End of Session Equipment Utilized During Treatment: Oxygen Activity Tolerance: Patient limited by fatigue;Patient limited by lethargy Patient left: in bed;with call bell/phone within reach;with family/visitor present;with bed alarm set Nurse Communication: Mobility status PT Visit Diagnosis: Muscle weakness (generalized) (M62.81);Difficulty in walking, not elsewhere classified (R26.2)    Time: 4782-9562 PT Time Calculation (min) (ACUTE ONLY): 37 min   Charges:   PT Evaluation $PT Eval Moderate Complexity: 1 Mod PT Treatments $Therapeutic Activity: 8-22 mins PT General Charges $$ ACUTE PT VISIT: 1 Visit  Lyanne Co, PT  Acute Rehab Services Secure chat preferred Office [phone]   Elyse Hsu 07/04/2023, 12:57 PM

## 2023-07-04 NOTE — Progress Notes (Signed)
Additional PT Note  SATURATION QUALIFICATIONS: (This note is used to comply with regulatory documentation for home oxygen)  Patient Saturations on Room Air at Rest = 87%  Patient Saturations on Room Air while Ambulating = 87%  Patient Saturations on 2 Liters of oxygen while Ambulating = 94%  Please briefly explain why patient needs home oxygen:Pt with O2 desaturation when on RA, has increased SOB as well as slower with responses. O2 improves to 90's on 2L O2.   Lyanne Co, PT  Acute Rehab Services Secure chat preferred Office [phone]

## 2023-07-04 NOTE — Plan of Care (Signed)
Problem: Education: Goal: Knowledge of General Education information will improve Description: Including pain rating scale, medication(s)/side effects and non-pharmacologic comfort measures Outcome: Progressing   Problem: Nutrition: Goal: Adequate nutrition will be maintained Outcome: Progressing   Problem: Elimination: Goal: Will not experience complications related to bowel motility Outcome: Progressing

## 2023-07-04 NOTE — Plan of Care (Signed)
Problem: Activity: Goal: Will verbalize the importance of balancing activity with adequate rest periods Outcome: Completed/Met

## 2023-07-04 NOTE — Progress Notes (Signed)
RN informed MD of patient's daughters wanting updates. Phone numbers provided to MD.

## 2023-07-04 NOTE — Care Management Important Message (Signed)
Important Message  Patient Details  Name: Sherry Fitzpatrick MRN: [id number] Date of Birth: 03-18-1951   Medicare Important Message Given:  Yes     Joon Pohle Stefan Church 07/04/2023, 3:38 PM

## 2023-07-05 ENCOUNTER — Inpatient Hospital Stay (HOSPITAL_COMMUNITY): Payer: 59

## 2023-07-05 DIAGNOSIS — J441 Chronic obstructive pulmonary disease with (acute) exacerbation: Secondary | ICD-10-CM | POA: Diagnosis not present

## 2023-07-05 LAB — CBC WITH DIFFERENTIAL/PLATELET
Abs Immature Granulocytes: 0.25 10*3/uL — ABNORMAL HIGH (ref 0.00–0.07)
Basophils Absolute: 0 10*3/uL (ref 0.0–0.1)
Basophils Relative: 0 %
Eosinophils Absolute: 0 10*3/uL (ref 0.0–0.5)
Eosinophils Relative: 0 %
HCT: 38.4 % (ref 36.0–46.0)
Hemoglobin: 11.5 g/dL — ABNORMAL LOW (ref 12.0–15.0)
Immature Granulocytes: 3 %
Lymphocytes Relative: 10 %
Lymphs Abs: 1 10*3/uL (ref 0.7–4.0)
MCH: 28.5 pg (ref 26.0–34.0)
MCHC: 29.9 g/dL — ABNORMAL LOW (ref 30.0–36.0)
MCV: 95 fL (ref 80.0–100.0)
Monocytes Absolute: 0.3 10*3/uL (ref 0.1–1.0)
Monocytes Relative: 3 %
Neutro Abs: 8.3 10*3/uL — ABNORMAL HIGH (ref 1.7–7.7)
Neutrophils Relative %: 84 %
Platelets: 342 10*3/uL (ref 150–400)
RBC: 4.04 MIL/uL (ref 3.87–5.11)
RDW: 16.3 % — ABNORMAL HIGH (ref 11.5–15.5)
WBC: 9.8 10*3/uL (ref 4.0–10.5)
nRBC: 0 % (ref 0.0–0.2)

## 2023-07-05 LAB — URINALYSIS, COMPLETE (UACMP) WITH MICROSCOPIC
Bilirubin Urine: NEGATIVE
Glucose, UA: NEGATIVE mg/dL
Hgb urine dipstick: NEGATIVE
Ketones, ur: NEGATIVE mg/dL
Nitrite: NEGATIVE
Protein, ur: NEGATIVE mg/dL
Specific Gravity, Urine: 1.008 (ref 1.005–1.030)
pH: 5 (ref 5.0–8.0)

## 2023-07-05 LAB — BASIC METABOLIC PANEL
Anion gap: 9 (ref 5–15)
BUN: 96 mg/dL — ABNORMAL HIGH (ref 8–23)
CO2: 25 mmol/L (ref 22–32)
Calcium: 7.6 mg/dL — ABNORMAL LOW (ref 8.9–10.3)
Chloride: 100 mmol/L (ref 98–111)
Creatinine, Ser: 4.09 mg/dL — ABNORMAL HIGH (ref 0.44–1.00)
GFR, Estimated: 11 mL/min — ABNORMAL LOW (ref >60)
Glucose, Bld: 203 mg/dL — ABNORMAL HIGH (ref 70–99)
Potassium: 4.7 mmol/L (ref 3.5–5.1)
Sodium: 134 mmol/L — ABNORMAL LOW (ref 135–145)

## 2023-07-05 LAB — GLUCOSE, CAPILLARY
Glucose-Capillary: 155 mg/dL — ABNORMAL HIGH (ref 70–99)
Glucose-Capillary: 140 mg/dL — ABNORMAL HIGH (ref 70–99)
Glucose-Capillary: 177 mg/dL — ABNORMAL HIGH (ref 70–99)
Glucose-Capillary: 165 mg/dL — ABNORMAL HIGH (ref 70–99)

## 2023-07-05 LAB — MAGNESIUM: Magnesium: 3 mg/dL — ABNORMAL HIGH (ref 1.7–2.4)

## 2023-07-05 LAB — BRAIN NATRIURETIC PEPTIDE: B Natriuretic Peptide: 147.1 pg/mL — ABNORMAL HIGH (ref 0.0–100.0)

## 2023-07-05 MED ORDER — FUROSEMIDE 10 MG/ML IJ SOLN
40.0000 mg | Freq: Two times a day (BID) | INTRAMUSCULAR | Status: DC
  Administered 2023-07-05 – 2023-07-06 (×3): 40 mg via INTRAVENOUS
  Filled 2023-07-05 (×3): qty 4

## 2023-07-05 MED ORDER — GUAIFENESIN-DM 100-10 MG/5ML PO SYRP
10.0000 mL | ORAL_SOLUTION | ORAL | Status: DC | PRN
  Administered 2023-07-05: 10 mL via ORAL
  Filled 2023-07-05: qty 10

## 2023-07-05 MED ORDER — METHYLPREDNISOLONE SODIUM SUCC 40 MG IJ SOLR
40.0000 mg | Freq: Every day | INTRAMUSCULAR | Status: DC
  Administered 2023-07-05 – 2023-07-06 (×2): 40 mg via INTRAVENOUS
  Filled 2023-07-05 (×2): qty 1

## 2023-07-05 MED ORDER — ORAL CARE MOUTH RINSE: 15.0000 mL | OROMUCOSAL | Status: DC | PRN

## 2023-07-05 NOTE — TOC Progression Note (Addendum)
Transition of Care [facility]) - Progression Note    Patient Details  Name: Sherry Fitzpatrick MRN: [id number] Date of Birth: 15-Jan-1951  Transition of [facility]) CM/SW Contact  Tom-Johnson, Hershal Coria, RN Phone Number: 07/05/2023, 1:59 PM  Clinical Narrative:     CM spoke with patient at bedside about discharge disposition. Patient states she is not going to SNF and requested to return home with Home Health. States she was active with [facility] and would like to use their services again.  CM called in referral to [facility] and [employer], info on AVS.  Nebulizer Machine ordered from [place], Earna Coder to deliver.   CM will continue to follow as patient progresses with care towards discharge.      Expected Discharge Plan and Services                                               Social Determinants of Health (SDOH) Interventions SDOH Screenings   Food Insecurity: No Food Insecurity (07/02/2023)  Housing: Low Risk  (07/02/2023)  Transportation Needs: No Transportation Needs (07/02/2023)  Utilities: Not At Risk (07/02/2023)  Tobacco Use: Low Risk  (07/01/2023)    Readmission Risk Interventions    07/03/2023   11:10 AM 12/18/2022    2:36 PM  Readmission Risk Prevention Plan  Transportation Screening Complete Complete  PCP or Specialist Appt within 5-7 Days  Complete  PCP or Specialist Appt within 3-5 Days Complete   Home Care Screening  Complete  Medication Review (RN CM)  Complete  HRI or Home Care Consult Complete   Social Work Consult for Recovery Care Planning/Counseling Complete   Palliative Care Screening Not Applicable   Medication Review Oceanographer) Referral to Pharmacy

## 2023-07-05 NOTE — Progress Notes (Signed)
PROGRESS NOTE    Sherry Fitzpatrick  ZOX:[id number] DOB: [birth year] DOA: 07/01/2023 PCP: Shon Hale, MD   Brief Narrative:   72 y.o. female with medical history significant of COPD, diabetes type 2, history of CAD, grade 3 diastolic heart failure, restrictive cardiomyopathy, CVA, atrial fibrillation not on anticoagulation, obstructive sleep apnea, GERD,  chronic anemia, obesity, hyperlipidemia and CKD stage 3B presented with worsening shortness of breath.  On presentation, she was saturating 75% on room air along with wheezing.  Chest x-ray showed central vascular congestion without significant edema.  COVID-19/influenza/RSV PCR were negative.  She was started on IV Solu-Medrol.  Assessment & Plan:   COPD exacerbation Acute respiratory failure with hypoxia -Chest x-ray as above.  COVID-19/influenza/RSV PCR negative on admission. -Continue Solu-Medrol and Dulera.  Continue nebs.  Continue Zithromax. -Respiratory status improving.  Currently on room air.  Acute on chronic diastolic heart failure/restrictive cardiomyopathy Hypertension -Echo on 12/16/2022 EF of 65 to 70% with grade 3 diastolic dysfunction.  Strict input and output.  Daily weights.  Fluid restriction.  Lasix initially on hold because of AKI.  Blood pressure was on the lower side on 07/04/2023 and's antihypertensives on hold for now.  Patient received a dose of IV Lasix on 07/04/2023 for concerns for of volume overload.  Might have to put her on scheduled IV Lasix.  Chest x-ray pending for this morning. -Outpatient follow-up with cardiology  AKI on CKD stage IIIb -Baseline creatinine of 1.2-1.6.  Monitor.  -Creatinine pending for today.  Creatinine worsened to 4.96 on 07/04/2023.  Renal ultrasound negative for hydronephrosis.  IV fluids started on 07/04/2023 but subsequently discontinued and patient received a dose of IV Lasix.  History of coronary artery disease Hyperlipidemia -Stable.  Continue aspirin, Lipitor.  Coreg on  hold for now.  Paroxysmal A-fib -Not on anticoagulation as an outpatient.  Continue amiodarone.  Outpatient follow-up with cardiology  Diabetes mellitus type 2 with hyperglycemia -Continue CBGs with SSI  Anemia of chronic disease -Hemoglobin stable.  Monitor intermittently.  Leukocytosis -Most likely reactive.  Monitor.  Labs pending for today.  History of obstructive sleep apnea -Stable.  Outpatient follow-up  Morbid obesity -Outpatient follow-up  Physical deconditioning -PT recommending home with PT.   DVT prophylaxis: Subcutaneous heparin Code Status: Full Family Communication: Spoke to daughter/Anita on phone on 07/04/2023 Disposition Plan: Status is: Inpatient Remains inpatient appropriate because: Of severity of illness    Consultants: None  Procedures: None  Antimicrobials:  Anti-infectives (From admission, onward)    Start     Dose/Rate Route Frequency Ordered Stop   07/02/23 2315  azithromycin (ZITHROMAX) tablet 500 mg       Placed in "Followed by" Linked Group   500 mg Oral Daily 07/01/23 2305 07/06/23 0959   07/01/23 2304  azithromycin (ZITHROMAX) 500 mg in sodium chloride 0.9 % 250 mL IVPB       Placed in "Followed by" Linked Group   500 mg 250 mL/hr over 60 Minutes Intravenous Every 24 hours 07/01/23 2305 07/02/23 0202        Subjective: Patient seen and examined at bedside.  No fever, vomiting, abdominal pain reported.  Still short of breath with exertion.  Still feels weak.   Objective: Vitals:   07/04/23 1500 07/04/23 1701 07/04/23 2010 07/05/23 0542  BP:  (!) 99/43 112/61 (!) 166/76  Pulse:  (!) 54 (!) 53 (!) 58  Resp:  18 18 16   Temp:  98.4 F (36.9 C) 98 F (36.7 C) 98.2 F (  36.8 C)  TempSrc:    Oral  SpO2: 96% 99% 99% 97%  Weight:      Height:        Intake/Output Summary (Last 24 hours) at 07/05/2023 0726 Last data filed at 07/05/2023 0600 Gross per 24 hour  Intake 1315.25 ml  Output 650 ml  Net 665.25 ml   Filed Weights    07/02/23 1525 07/04/23 0429  Weight: 110.5 kg 112.7 kg    Examination:  General: No acute distress.  Still on 2 L oxygen via nasal cannula.  Chronically ill and deconditioned looking. ENT/neck: No neck masses or obvious JVD elevation noted  respiratory: Bilateral decreased breath sounds at bases with scattered crackles CVS: Intermittently bradycardic; S1 and S2 are heard Abdominal: Soft, morbidly obese, nontender, distended mildly; no organomegaly, bowel sounds normally heard Extremities: No clubbing; bilateral lower extremity edema present CNS: Alert and awake; slow to respond.  Poor historian.  No focal neurologic deficit.  Able to move extremities.   Lymph: No palpable lymphadenopathy  skin: No obvious petechiae/rashes psych: Flat affect.  Not agitated currently.   Musculoskeletal: No obvious joint tenderness/swelling    Data Reviewed: I have personally reviewed following labs and imaging studies  CBC: Recent Labs  Lab 07/01/23 1748 07/01/23 2334 07/02/23 0259 07/03/23 0926  WBC 13.3*  --  11.6* 19.6*  NEUTROABS  --   --   --  17.5*  HGB 11.7* 12.2 11.3* 11.2*  HCT 37.4 36.0 37.0 35.6*  MCV 90.8  --  91.4 93.2  PLT 325  --  314 337   Basic Metabolic Panel: Recent Labs  Lab 07/01/23 1748 07/01/23 2334 07/02/23 0259 07/03/23 0926 07/04/23 0437 07/04/23 1716  NA 137 138 137 138 135 136  K 3.7 4.3 4.1 4.5 4.9 5.1  CL 99  --  100 100 100 101  CO2 28  --  26 25 26 23   GLUCOSE 143*  --  216* 162* 165* 112*  BUN 12  --  21 60* 77* 90*  CREATININE 1.21*  --  2.12* 3.96* 4.65* 4.96*  CALCIUM 8.3*  --  8.3* 8.5* 8.0* 7.8*  MG  --   --   --  2.7*  --   --    GFR: Estimated Creatinine Clearance: 12.2 mL/min (A) (by C-G formula based on SCr of 4.96 mg/dL (H)). Liver Function Tests: Recent Labs  Lab 07/02/23 0259 07/03/23 0926  AST 24 16  ALT 18 17  ALKPHOS 69 65  BILITOT 0.5 0.2*  PROT 7.6 7.3  ALBUMIN 3.1* 3.1*   No results for input(s): "LIPASE",  "AMYLASE" in the last 168 hours. No results for input(s): "AMMONIA" in the last 168 hours. Coagulation Profile: No results for input(s): "INR", "PROTIME" in the last 168 hours. Cardiac Enzymes: No results for input(s): "CKTOTAL", "CKMB", "CKMBINDEX", "TROPONINI" in the last 168 hours. BNP (last 3 results) No results for input(s): "PROBNP" in the last 8760 hours. HbA1C: No results for input(s): "HGBA1C" in the last 72 hours.  CBG: Recent Labs  Lab 07/04/23 0731 07/04/23 1134 07/04/23 1633 07/04/23 2013 07/05/23 0719  GLUCAP 147* 171* 118* 123* 155*   Lipid Profile: No results for input(s): "CHOL", "HDL", "LDLCALC", "TRIG", "CHOLHDL", "LDLDIRECT" in the last 72 hours. Thyroid Function Tests: No results for input(s): "TSH", "T4TOTAL", "FREET4", "T3FREE", "THYROIDAB" in the last 72 hours. Anemia Panel: No results for input(s): "VITAMINB12", "FOLATE", "FERRITIN", "TIBC", "IRON", "RETICCTPCT" in the last 72 hours. Sepsis Labs: Recent Labs  Lab 07/03/23 [phone]  PROCALCITON 0.23    Recent Results (from the past 240 hour(s))  Resp panel by RT-PCR (RSV, Flu A&B, Covid) Anterior Nasal Swab     Status: None   Collection Time: 07/01/23  7:43 PM   Specimen: Anterior Nasal Swab  Result Value Ref Range Status   SARS Coronavirus 2 by RT PCR NEGATIVE NEGATIVE Final   Influenza A by PCR NEGATIVE NEGATIVE Final   Influenza B by PCR NEGATIVE NEGATIVE Final    Comment: (NOTE) The Xpert Xpress SARS-CoV-2/FLU/RSV plus assay is intended as an aid in the diagnosis of influenza from Nasopharyngeal swab specimens and should not be used as a sole basis for treatment. Nasal washings and aspirates are unacceptable for Xpert Xpress SARS-CoV-2/FLU/RSV testing.  Fact Sheet for Patients: BloggerCourse.com  Fact Sheet for Healthcare Providers: SeriousBroker.it  This test is not yet approved or cleared by the [place] FDA and has been authorized  for detection and/or diagnosis of SARS-CoV-2 by FDA under an Emergency Use Authorization (EUA). This EUA will remain in effect (meaning this test can be used) for the duration of the COVID-19 declaration under Section 564(b)(1) of the Act, 21 U.S.C. section 360bbb-3(b)(1), unless the authorization is terminated or revoked.     Resp Syncytial Virus by PCR NEGATIVE NEGATIVE Final    Comment: (NOTE) Fact Sheet for Patients: BloggerCourse.com  Fact Sheet for Healthcare Providers: SeriousBroker.it  This test is not yet approved or cleared by the [place] FDA and has been authorized for detection and/or diagnosis of SARS-CoV-2 by FDA under an Emergency Use Authorization (EUA). This EUA will remain in effect (meaning this test can be used) for the duration of the COVID-19 declaration under Section 564(b)(1) of the Act, 21 U.S.C. section 360bbb-3(b)(1), unless the authorization is terminated or revoked.  Performed at [facility], [address] [place], [place] [address]   Culture, blood (Routine X 2) w Reflex to ID Panel     Status: None (Preliminary result)   Collection Time: 07/01/23 11:43 PM   Specimen: BLOOD  Result Value Ref Range Status   Specimen Description BLOOD LEFT ANTECUBITAL  Final   Special Requests   Final    BOTTLES DRAWN AEROBIC AND ANAEROBIC Blood Culture adequate volume   Culture   Final    NO GROWTH 2 DAYS Performed at [facility], [address] [place], [place] [address]    Report Status PENDING  Incomplete  Culture, blood (Routine X 2) w Reflex to ID Panel     Status: None (Preliminary result)   Collection Time: 07/01/23 11:48 PM   Specimen: BLOOD RIGHT HAND  Result Value Ref Range Status   Specimen Description BLOOD RIGHT HAND  Final   Special Requests   Final    BOTTLES DRAWN AEROBIC AND ANAEROBIC Blood Culture adequate volume   Culture   Final    NO GROWTH 2 DAYS Performed at [facility], [address], [place], [place] [address]    Report Status PENDING  Incomplete         Radiology Studies: US RENAL  Result Date: 07/04/2023 CLINICAL DATA:  AKI EXAM: RENAL / URINARY TRACT ULTRASOUND COMPLETE COMPARISON:  Renal ultrasound 12/18/2022 FINDINGS: Right Kidney: Renal measurements: 10.3 cm x 4.0 cm x 5.0 cm = volume: 107 mL. Echogenicity within normal limits. No mass or hydronephrosis visualized. Left Kidney: Renal measurements: 11.0 cm x 5.1 cm x 4.5 cm = volume: 132 mL. Parenchymal echogenicity is normal. There is 2.5  cm cyst requiring no specific imaging follow-up. There is no hydronephrosis. Bladder: Not identified. Other: None. IMPRESSION: Left renal cyst requiring no specific imaging follow-up. Otherwise, unremarkable renal ultrasound. Electronically Signed   By: Lesia Hausen M.D.   On: 07/04/2023 13:24        Scheduled Meds:  amiodarone  100 mg Oral Daily   aspirin  81 mg Oral Daily   atorvastatin  40 mg Oral Daily   azithromycin  500 mg Oral Daily   clopidogrel  75 mg Oral Daily   ferrous sulfate  325 mg Oral BID WC   fluticasone  1 spray Each Nare Daily   heparin  5,000 Units Subcutaneous Q8H   insulin aspart  0-5 Units Subcutaneous QHS   insulin aspart  0-6 Units Subcutaneous TID WC   mometasone-formoterol  2 puff Inhalation BID   pantoprazole  40 mg Oral Daily   potassium chloride SA  20 mEq Oral Daily   sodium chloride flush  3 mL Intravenous Q12H   Continuous Infusions:  sodium chloride            Glade Lloyd, MD [facility] 07/05/2023, 7:26 AM

## 2023-07-05 NOTE — Progress Notes (Signed)
Nephrology Consult   Assessment/Recommendations:   AKI on CKD3 -Baseline Cr entirely unknown but reported to be around 1.2-1.6. Suspecting AKI is multifactorial: soft/low Bps causing decreased renal refusion and cardiorenal? -No obstruction on imaging.  UA with 6-10 RBCs per high-power field.  Will check ANA and ANCA serologies -agree with lasix 40mg  BID for now -no indication for renal replacement therapy at this time -Avoid nephrotoxic medications including NSAIDs and iodinated intravenous contrast exposure unless the latter is absolutely indicated.  Preferred narcotic agents for pain control are hydromorphone, fentanyl, and methadone. Morphine should not be used. Avoid Baclofen and avoid oral sodium phosphate and magnesium citrate based laxatives / bowel preps. Continue strict Input and Output monitoring. Will monitor the patient closely with you and intervene or adjust therapy as indicated by changes in clinical status/labs   Acute on chronic diastolic CHF exacerbation -repeat CXR today with slightly worsened vascular congestion. Agree with lasix 40mg  bid. Monitor strict I/O, daily weights -recommend repeat echo  AHRF, Acute on chronic COPD exacerbation -per primary, receiving solumedrol  HTN -BP on the soft side, watch for now. asymptomatic  DM2 with hyperglycemia -per primary  Anemia -transfuse prn for hgb <7 -Hgb stable 11.5  Recommendations conveyed to primary service. Discussed with daughter Synetta Fail over the phone.  Ariani Seier [facility] 07/05/2023 1:05 PM   _____________________________________________________________________________________   History of Present Illness: Sherry Fitzpatrick is a/an 71 y.o. female with a past medical history of COPD, DM 2, CAD, diastolic CHF, respiratory cardiomyopathy, CVA, A-fib, OSA, GERD, chronic anemia, obesity, hyperlipidemia, CKD 3B who presents to [facility] with shortness of breath.  Per EMS, O2 sats dropped to 75% on  arrival.  CXR revealed central vascular congestion without any significant edema, COVID/influenza/RSV PCR negative.  Found to have COPD exacerbation and acute on chronic diastolic CHF.  Receiving Solu-Medrol.  Lasix currently on hold (received a dose on 9/19 because of AKI. CXR with worsened vascular congestion today therefore started on lasix 40mg  bid for now. Patient seen and examined bedside. She reports that she is feeling okay. Has ongoing SOB. Does feel weak but denies any fevers, chills, chest pain, dizziness, hematuria, dysuria, recent NSAID use. She reports that her mother had a kidney removed but details unknown to patient and to patient's daughter. They deny any family history of any autoimmune conditions.   Medications:  Current Facility-Administered Medications  Medication Dose Route Frequency Provider Last Rate Last Admin   0.9 %  sodium chloride infusion  250 mL Intravenous PRN Janalyn Shy, Subrina, MD       acetaminophen (TYLENOL) tablet 650 mg  650 mg Oral Q6H PRN Janalyn Shy, Subrina, MD   650 mg at 07/05/23 6045   Or   acetaminophen (TYLENOL) suppository 650 mg  650 mg Rectal Q6H PRN Janalyn Shy, Subrina, MD       amiodarone (PACERONE) tablet 100 mg  100 mg Oral Daily Sundil, Subrina, MD   100 mg at 07/05/23 0856   aspirin chewable tablet 81 mg  81 mg Oral Daily Sundil, Subrina, MD   81 mg at 07/05/23 0856   atorvastatin (LIPITOR) tablet 40 mg  40 mg Oral Daily Sundil, Subrina, MD   40 mg at 07/05/23 0856   clopidogrel (PLAVIX) tablet 75 mg  75 mg Oral Daily Sundil, Subrina, MD   75 mg at 07/05/23 0856   ferrous sulfate tablet 325 mg  325 mg Oral BID WC Sundil, Subrina, MD   325 mg at 07/05/23 0856   fluticasone (FLONASE)  50 MCG/ACT nasal spray 1 spray  1 spray Each Nare Daily Sundil, Subrina, MD   1 spray at 07/05/23 0857   furosemide (LASIX) injection 40 mg  40 mg Intravenous Q12H Glade Lloyd, MD   40 mg at 07/05/23 1121   guaiFENesin-dextromethorphan (ROBITUSSIN DM) 100-10 MG/5ML syrup  10 mL  10 mL Oral Q4H PRN Glade Lloyd, MD   10 mL at 07/05/23 1147   heparin injection 5,000 Units  5,000 Units Subcutaneous Q8H Sundil, Subrina, MD   5,000 Units at 07/05/23 0856   insulin aspart (novoLOG) injection 0-5 Units  0-5 Units Subcutaneous QHS Sundil, Subrina, MD       insulin aspart (novoLOG) injection 0-6 Units  0-6 Units Subcutaneous TID WC Sundil, Subrina, MD   1 Units at 07/05/23 0857   levalbuterol (XOPENEX) nebulizer solution 0.63 mg  0.63 mg Nebulization Q6H PRN Sundil, Subrina, MD   0.63 mg at 07/03/23 1640   methylPREDNISolone sodium succinate (SOLU-MEDROL) 40 mg/mL injection 40 mg  40 mg Intravenous Daily Glade Lloyd, MD   40 mg at 07/05/23 1121   mometasone-formoterol (DULERA) 200-5 MCG/ACT inhaler 2 puff  2 puff Inhalation BID Glade Lloyd, MD   2 puff at 07/05/23 0849   ondansetron (ZOFRAN) tablet 4 mg  4 mg Oral Q6H PRN Janalyn Shy, Subrina, MD       Or   ondansetron [facility]) injection 4 mg  4 mg Intravenous Q6H PRN Janalyn Shy, Subrina, MD       Oral care mouth rinse  15 mL Mouth Rinse PRN Glade Lloyd, MD       pantoprazole (PROTONIX) EC tablet 40 mg  40 mg Oral Daily Sundil, Subrina, MD   40 mg at 07/05/23 0856   polyethylene glycol (MIRALAX / GLYCOLAX) packet 17 g  17 g Oral Daily PRN Janalyn Shy, Subrina, MD       senna-docusate (Senokot-S) tablet 1 tablet  1 tablet Oral QHS PRN Janalyn Shy, Subrina, MD       sodium chloride flush (NS) 0.9 % injection 3 mL  3 mL Intravenous Q12H Sundil, Subrina, MD   3 mL at 07/05/23 0857   sodium chloride flush (NS) 0.9 % injection 3 mL  3 mL Intravenous PRN Janalyn Shy, Subrina, MD   3 mL at 07/04/23 1148     ALLERGIES Morphine and codeine, Penicillins, Percocet [oxycodone-acetaminophen], Ace inhibitors, Iodinated contrast media, Iodine, Etodolac, Tramadol, and Vicodin [hydrocodone-acetaminophen]  MEDICAL HISTORY Past Medical History:  Diagnosis Date   Acute non-Q wave non-ST elevation myocardial infarction (NSTEMI) (HCC) 05/24/2020   Acute  respiratory failure with hypoxia (HCC) 11/22/2019   AKI (acute kidney injury) (HCC) 07/03/2018   Alterations of sensations, late effect of cerebrovascular disease(438.6)    Arthritis    "all over"   Backache, unspecified    Body mass index 40.0-44.9, adult (HCC)    Carpal tunnel syndrome    Cerebral thrombosis with cerebral infarction 05/29/2020   Dizziness and giddiness    Esophageal reflux    Generalized pain    Gout attack 03/28/2020   Headache    "had alot til I had pituitary tumor removed"   Heart murmur    Hypertensive encephalopathy 03/28/2020   Mild cognitive impairment 07/16/2022   NSTEMI (non-ST elevated myocardial infarction) (HCC)    Other abnormal glucose    Other and unspecified hyperlipidemia    Other malaise and fatigue    Other postprocedural status(V45.89)    Other symptoms involving cardiovascular system    Shock (HCC)    Sleep  apnea    "did test; they wanted me to get a CPAP but I never did get it" (04/14/2015) Does use inhaler at night (01 2021)   Stroke (HCC) 06/2003; 02/2010   "minor; minor" ; denies residual on 04/14/2015   Unspecified disorder of the pituitary gland and its hypothalamic control    Unspecified essential hypertension    Unspecified visual disturbance      SOCIAL HISTORY Social History   Socioeconomic History   Marital status: Divorced    Spouse name: Not on file   Number of children: 2   Years of education: 13.5   Highest education level: Not on file  Occupational History   Occupation: unemployed  Tobacco Use   Smoking status: Never   Smokeless tobacco: Never  Vaping Use   Vaping status: Never Used  Substance and Sexual Activity   Alcohol use: Yes    Alcohol/week: 0.0 standard drinks of alcohol    Comment: rarely; "I don't drink.  But I like it."   Drug use: No   Sexual activity: Yes  Other Topics Concern   Not on file  Social History Narrative   Left handed   Drinks caffeine   One level home   Social Determinants of  Health   Financial Resource Strain: Not on file  Food Insecurity: No Food Insecurity (07/02/2023)   Hunger Vital Sign    Worried About Running Out of Food in the Last Year: Never true    Ran Out of Food in the Last Year: Never true  Transportation Needs: No Transportation Needs (07/02/2023)   PRAPARE - Administrator, Civil Service (Medical): No    Lack of Transportation (Non-Medical): No  Physical Activity: Not on file  Stress: Not on file  Social Connections: Not on file  Intimate Partner Violence: Not At Risk (07/02/2023)   Humiliation, Afraid, Rape, and Kick questionnaire    Fear of Current or Ex-Partner: No    Emotionally Abused: No    Physically Abused: No    Sexually Abused: No     FAMILY HISTORY Family History  Problem Relation Age of Onset   Heart failure Mother    Hypertension Mother    Diabetes Father    Hypertension Sister    Heart failure Sister    Hypertension Daughter      Review of Systems: 12 systems reviewed Otherwise as per HPI, all other systems reviewed and negative  Physical Exam: Vitals:   07/05/23 0740 07/05/23 0904  BP:  (!) 136/59  Pulse:  63  Resp:  16  Temp:  98 F (36.7 C)  SpO2: 95% 98%   Total I/O In: 120 [P.O.:120] Out: -   Intake/Output Summary (Last 24 hours) at 07/05/2023 1305 Last data filed at 07/05/2023 0900 Gross per 24 hour  Intake 1075.25 ml  Output 650 ml  Net 425.25 ml   General: well-appearing, no acute distress, sitting in chair HEENT: anicteric sclera, oropharynx clear without lesions CV: regular rate, normal rhythm, no murmurs, no gallops, no rubs,  Lungs: bibasilar fine crackles, normal WOB Abd: obese, soft, non-tender, non-distended Skin: no visible lesions or rashes Psych: alert, engaged, appropriate mood and affect Musculoskeletal: no sig edema b/l LEs Neuro: normal speech, no gross focal deficits   Test Results Reviewed Lab Results  Component Value Date   NA 134 (L) 07/05/2023   K 4.7  07/05/2023   CL 100 07/05/2023   CO2 25 07/05/2023   BUN 96 (H) 07/05/2023  CREATININE 4.09 (H) 07/05/2023   CALCIUM 7.6 (L) 07/05/2023   ALBUMIN 3.1 (L) 07/03/2023   PHOS 4.1 12/24/2022     I have reviewed all relevant outside healthcare records related to the patient's kidney injury.

## 2023-07-05 NOTE — Progress Notes (Addendum)
Physical Therapy Treatment Patient Details Name: Sherry Fitzpatrick MRN: [id number] DOB: 1950/10/21 Today's Date: 07/05/2023   History of Present Illness 72 y.o. female who presents on 07/01/23 with worsening SOB and saturation 75% with wheezing. PMH: cognitive impairment, diabetes, diastolic CHF, stroke, A-fib, OSA, GERD, anemia, obesity, hyperlipidemia, CKD 3, asthma, anxiety, depression    PT Comments  Pt's activity tolerance remains limited, requiring frequent rest breaks in between positional changes due to cough and DOE. Pt also continues to exhibit generalized weakness, needing physical assistance and use of railings to perform bed mobility as well as physical assistance to ascend into standing. Pt reports her daughter works during the mornings, leaving her alone. Pt would need physical assistance at this time to manage even limited household mobility. PT updates recommendations to short term inpatient PT placement as the pt appears to have insufficient caregiver support to care for her self during these time. If increased assistance from family or aide services becomes available discharging home with HHPT may become a more viable option. Due to slow pt progress PT does not anticipate the pt being able to perform necessary care for herself in the next day or two.   PT spoke with pt's daughter, [name], who is not agreeable to short term inpatient PT placement at this time. Candace reports she will contact her sister to see if the pt can stay with her as she works from home.   If plan is discharge home, recommend the following: A little help with walking and/or transfers;A lot of help with bathing/dressing/bathroom;Assistance with cooking/housework;Assist for transportation;Help with stairs or ramp for entrance   Can travel by private vehicle     Yes  Equipment Recommendations  None recommended by PT    Recommendations for Other Services       Precautions / Restrictions  Precautions Precautions: Fall Restrictions Weight Bearing Restrictions: No     Mobility  Bed Mobility Overal bed mobility: Needs Assistance Bed Mobility: Supine to Sit     Supine to sit: Min assist, HOB elevated, Used rails          Transfers Overall transfer level: Needs assistance Equipment used: Rollator (4 wheels) Transfers: Sit to/from Stand Sit to Stand: Min assist           General transfer comment: verbal cues for hand placement, tactile cues to facilitate trunk flexion    Ambulation/Gait Ambulation/Gait assistance: Contact guard assist Gait Distance (Feet): 15 Feet Assistive device: Rollator (4 wheels) Gait Pattern/deviations: Step-to pattern Gait velocity: reduced Gait velocity interpretation: <1.31 ft/sec, indicative of household ambulator   General Gait Details: slowed step-to gait, verbal cues to slow gait speed with backward stepping to complete transfer to recliner   Stairs             Wheelchair Mobility     Tilt Bed    Modified Rankin (Stroke Patients Only)       Balance Overall balance assessment: Needs assistance Sitting-balance support: No upper extremity supported, Feet supported Sitting balance-Leahy Scale: Good     Standing balance support: Bilateral upper extremity supported, Reliant on assistive device for balance Standing balance-Leahy Scale: Poor                              Cognition Arousal: Alert Behavior During Therapy: Anxious Overall Cognitive Status: Impaired/Different from baseline Area of Impairment: Problem solving, Memory  Memory: Decreased short-term memory       Problem Solving: Slow processing          Exercises      General Comments General comments (skin integrity, edema, etc.): VSS on RA, pt with frequent coughing during session but sats well on 2L [place]. With brief period on room air pt desats to 88%.      Pertinent Vitals/Pain Pain  Assessment Pain Assessment: No/denies pain    Home Living                          Prior Function            PT Goals (current goals can now be found in the care plan section) Acute Rehab PT Goals Patient Stated Goal: return home Progress towards PT goals: Progressing toward goals    Frequency    Min 1X/week      PT Plan      Co-evaluation              AM-PAC PT "6 Clicks" Mobility   Outcome Measure  Help needed turning from your back to your side while in a flat bed without using bedrails?: None Help needed moving from lying on your back to sitting on the side of a flat bed without using bedrails?: A Little Help needed moving to and from a bed to a chair (including a wheelchair)?: A Little Help needed standing up from a chair using your arms (e.g., wheelchair or bedside chair)?: A Little Help needed to walk in hospital room?: A Lot Help needed climbing 3-5 steps with a railing? : Total 6 Click Score: 16    End of Session Equipment Utilized During Treatment: Oxygen Activity Tolerance: Patient limited by fatigue Patient left: with call bell/phone within reach;in chair Nurse Communication: Mobility status PT Visit Diagnosis: Muscle weakness (generalized) (M62.81);Difficulty in walking, not elsewhere classified (R26.2)     Time: 0981-1914 PT Time Calculation (min) (ACUTE ONLY): 25 min  Charges:    $Gait Training: 8-22 mins $Therapeutic Activity: 8-22 mins PT General Charges $$ ACUTE PT VISIT: 1 Visit                     Arlyss Gandy, PT, DPT Acute Rehabilitation Office [phone]    Arlyss Gandy 07/05/2023, 11:09 AM

## 2023-07-05 NOTE — Progress Notes (Cosign Needed Addendum)
    Durable Medical Equipment  (From admission, onward)           Start     Ordered   07/05/23 1405  For home use only DME Nebulizer machine  Once       Question Answer Comment  Patient needs a nebulizer to treat with the following condition COPD exacerbation (HCC)   Length of Need 12 Months      07/05/23 1404

## 2023-07-06 DIAGNOSIS — J441 Chronic obstructive pulmonary disease with (acute) exacerbation: Secondary | ICD-10-CM | POA: Diagnosis not present

## 2023-07-06 LAB — CBC WITH DIFFERENTIAL/PLATELET
Abs Immature Granulocytes: 0.11 10*3/uL — ABNORMAL HIGH (ref 0.00–0.07)
Basophils Absolute: 0 10*3/uL (ref 0.0–0.1)
Basophils Relative: 0 %
Eosinophils Absolute: 0 10*3/uL (ref 0.0–0.5)
Eosinophils Relative: 0 %
HCT: 39.6 % (ref 36.0–46.0)
Hemoglobin: 12.6 g/dL (ref 12.0–15.0)
Immature Granulocytes: 1 %
Lymphocytes Relative: 17 %
Lymphs Abs: 1.6 10*3/uL (ref 0.7–4.0)
MCH: 29 pg (ref 26.0–34.0)
MCHC: 31.8 g/dL (ref 30.0–36.0)
MCV: 91 fL (ref 80.0–100.0)
Monocytes Absolute: 1.2 10*3/uL — ABNORMAL HIGH (ref 0.1–1.0)
Monocytes Relative: 12 %
Neutro Abs: 6.6 10*3/uL (ref 1.7–7.7)
Neutrophils Relative %: 70 %
Platelets: 325 10*3/uL (ref 150–400)
RBC: 4.35 MIL/uL (ref 3.87–5.11)
RDW: 16.2 % — ABNORMAL HIGH (ref 11.5–15.5)
WBC: 9.5 10*3/uL (ref 4.0–10.5)
nRBC: 0 % (ref 0.0–0.2)

## 2023-07-06 LAB — GLUCOSE, CAPILLARY
Glucose-Capillary: 145 mg/dL — ABNORMAL HIGH (ref 70–99)
Glucose-Capillary: 153 mg/dL — ABNORMAL HIGH (ref 70–99)
Glucose-Capillary: 172 mg/dL — ABNORMAL HIGH (ref 70–99)
Glucose-Capillary: 168 mg/dL — ABNORMAL HIGH (ref 70–99)

## 2023-07-06 LAB — ANA W/REFLEX IF POSITIVE: Anti Nuclear Antibody (ANA): NEGATIVE

## 2023-07-06 LAB — BASIC METABOLIC PANEL
Anion gap: 15 (ref 5–15)
BUN: 103 mg/dL — ABNORMAL HIGH (ref 8–23)
CO2: 28 mmol/L (ref 22–32)
Calcium: 8.5 mg/dL — ABNORMAL LOW (ref 8.9–10.3)
Chloride: 95 mmol/L — ABNORMAL LOW (ref 98–111)
Creatinine, Ser: 3.3 mg/dL — ABNORMAL HIGH (ref 0.44–1.00)
GFR, Estimated: 14 mL/min — ABNORMAL LOW (ref >60)
Glucose, Bld: 121 mg/dL — ABNORMAL HIGH (ref 70–99)
Potassium: 4.9 mmol/L (ref 3.5–5.1)
Sodium: 138 mmol/L (ref 135–145)

## 2023-07-06 MED ORDER — AMLODIPINE BESYLATE 10 MG PO TABS
10.0000 mg | ORAL_TABLET | Freq: Every day | ORAL | Status: DC
  Administered 2023-07-06 – 2023-07-09 (×4): 10 mg via ORAL
  Filled 2023-07-06 (×4): qty 1

## 2023-07-06 MED ORDER — FUROSEMIDE 10 MG/ML IJ SOLN
40.0000 mg | Freq: Two times a day (BID) | INTRAMUSCULAR | Status: AC
  Administered 2023-07-06 – 2023-07-07 (×2): 40 mg via INTRAVENOUS
  Filled 2023-07-06 (×2): qty 4

## 2023-07-06 MED ORDER — CARVEDILOL 6.25 MG PO TABS
6.2500 mg | ORAL_TABLET | Freq: Two times a day (BID) | ORAL | Status: DC
  Administered 2023-07-06 – 2023-07-09 (×6): 6.25 mg via ORAL
  Filled 2023-07-06 (×7): qty 1

## 2023-07-06 MED ORDER — HYDRALAZINE HCL 25 MG PO TABS
25.0000 mg | ORAL_TABLET | Freq: Three times a day (TID) | ORAL | Status: DC
  Administered 2023-07-06 – 2023-07-09 (×10): 25 mg via ORAL
  Filled 2023-07-06 (×10): qty 1

## 2023-07-06 NOTE — Progress Notes (Signed)
[facility] Progress Note    Assessment/ Plan:   AKI on CKD3 -Baseline Cr entirely unknown but reported to be around 1.2-1.6. Suspecting AKI is multifactorial: soft/low Bps causing decreased renal refusion and cardiorenal? Agree with holding ARB. Recommend repeat echo -No obstruction on imaging.  UA with 6-10 RBCs per high-power field. Checking ANA and ANCA serologies--pending -agree with lasix 40mg  BID for now,will continue just for today and reassess thereafter -Cr down to 3.3 today. BUN on the rise, possibly steroids contributing? Lasix plan as above -no indication for renal replacement therapy at this time -Avoid nephrotoxic medications including NSAIDs and iodinated intravenous contrast exposure unless the latter is absolutely indicated.  Preferred narcotic agents for pain control are hydromorphone, fentanyl, and methadone. Morphine should not be used. Avoid Baclofen and avoid oral sodium phosphate and magnesium citrate based laxatives / bowel preps. Continue strict Input and Output monitoring. Will monitor the patient closely with you and intervene or adjust therapy as indicated by changes in clinical status/labs    Acute on chronic diastolic CHF exacerbation -repeat CXR Friday with slightly worsened vascular congestion. Agree with lasix 40mg  bid. Monitor strict I/O, daily weights -recommend repeat echo   AHRF, Acute on chronic COPD exacerbation -per primary, receiving solumedrol   HTN -BP was on the soft side, now hypertensive, some of her home meds have been resumed by primary service   DM2 with hyperglycemia -per primary   Anemia -transfuse prn for hgb <7 -Hgb stable 12.6  Subjective:   No acute events overnight. Uop ~2300 with one unmeasured void Drowsy on my encounter but interactive   Objective:   BP (!) 180/66 (BP Location: Left Arm)   Pulse 62   Temp 98 F (36.7 C) (Oral)   Resp 17   Ht 5\' 4"  (1.626 m)   Wt 112.9 kg   SpO2 92%   BMI 42.72  kg/m   Intake/Output Summary (Last 24 hours) at 07/06/2023 1059 Last data filed at 07/05/2023 2300 Gross per 24 hour  Intake 420 ml  Output 2300 ml  Net -1880 ml   Weight change:   Physical Exam: Gen: NAD, drowsy CVS: RRR Resp: bibasilar crackles Abd: obese, soft Ext: no edema b/l Les Neuro: drowsy but easily arousable, moves all ext spontaneously  Imaging: DG CHEST PORT 1 VIEW  Result Date: 07/05/2023 CLINICAL DATA:  Dyspnea, cough. EXAM: PORTABLE CHEST 1 VIEW COMPARISON:  Radiograph 07/01/2023. FINDINGS: Stable cardiomegaly. Unchanged mediastinal contours. Aortic atherosclerosis. Slight worsening pulmonary vascular congestion. No confluent consolidation. No significant pleural effusion. No pneumothorax. IMPRESSION: Slight worsening pulmonary vascular congestion. Stable cardiomegaly. Electronically Signed   By: Narda Rutherford M.D.   On: 07/05/2023 09:24    Labs: BMET Recent Labs  Lab 07/01/23 1748 07/01/23 2334 07/02/23 0259 07/03/23 0926 07/04/23 0437 07/04/23 1716 07/05/23 0943 07/06/23 0512  NA 137 138 137 138 135 136 134* 138  K 3.7 4.3 4.1 4.5 4.9 5.1 4.7 4.9  CL 99  --  100 100 100 101 100 95*  CO2 28  --  26 25 26 23 25 28   GLUCOSE 143*  --  216* 162* 165* 112* 203* 121*  BUN 12  --  21 60* 77* 90* 96* 103*  CREATININE 1.21*  --  2.12* 3.96* 4.65* 4.96* 4.09* 3.30*  CALCIUM 8.3*  --  8.3* 8.5* 8.0* 7.8* 7.6* 8.5*   CBC Recent Labs  Lab 07/02/23 0259 07/03/23 0926 07/05/23 0943 07/06/23 0512  WBC 11.6* 19.6* 9.8 9.5  NEUTROABS  --  17.5* 8.3* 6.6  HGB 11.3* 11.2* 11.5* 12.6  HCT 37.0 35.6* 38.4 39.6  MCV 91.4 93.2 95.0 91.0  PLT 314 337 342 325    Medications:     amiodarone  100 mg Oral Daily   amLODipine  10 mg Oral Daily   aspirin  81 mg Oral Daily   atorvastatin  40 mg Oral Daily   clopidogrel  75 mg Oral Daily   ferrous sulfate  325 mg Oral BID WC   fluticasone  1 spray Each Nare Daily   furosemide  40 mg Intravenous Q12H   heparin   5,000 Units Subcutaneous Q8H   hydrALAZINE  25 mg Oral Q8H   insulin aspart  0-5 Units Subcutaneous QHS   insulin aspart  0-6 Units Subcutaneous TID WC   methylPREDNISolone (SOLU-MEDROL) injection  40 mg Intravenous Daily   mometasone-formoterol  2 puff Inhalation BID   pantoprazole  40 mg Oral Daily   sodium chloride flush  3 mL Intravenous Q12H      Anthony Sar, MD [facility] 07/06/2023, 10:59 AM

## 2023-07-06 NOTE — Progress Notes (Signed)
BP has been high over night despite IV Lasix, MD was informed this AM and BP medications were resumed (Hydralazine and Amlodipine). BP was rechecked at 1140am, 181/52mmHg. MD was informed and added Coreg. Pt has no other symptom.  07/06/23 1140  Vitals  BP (!) 181/67  MAP (mmHg) 96  MEWS COLOR  MEWS Score Color Green  MEWS Score  MEWS Temp 0  MEWS Systolic 0  MEWS Pulse 0  MEWS RR 0  MEWS LOC 0  MEWS Score 0

## 2023-07-06 NOTE — Progress Notes (Signed)
SATURATION QUALIFICATIONS: (This note is used to comply with regulatory documentation for home oxygen)  Patient Saturations on Room Air at Rest = 86%  Patient Saturations on Room Air while Ambulating = not assessed due to desaturation on RA at rest  Patient Saturations on 2 Liters of oxygen while Ambulating = 90%  Please briefly explain why patient needs home oxygen: to maintain oxygen saturation when at rest and when mobilizing.

## 2023-07-06 NOTE — Progress Notes (Signed)
PROGRESS NOTE    Sherry Fitzpatrick  UVO:[id number] DOB: 02-Jul-1951 DOA: 07/01/2023 PCP: Shon Hale, MD   Brief Narrative:   72 y.o. female with medical history significant of COPD, diabetes type 2, history of CAD, grade 3 diastolic heart failure, restrictive cardiomyopathy, CVA, atrial fibrillation not on anticoagulation, obstructive sleep apnea, GERD,  chronic anemia, obesity, hyperlipidemia and CKD stage 3B presented with worsening shortness of breath.  On presentation, she was saturating 75% on room air along with wheezing.  Chest x-ray showed central vascular congestion without significant edema.  COVID-19/influenza/RSV PCR were negative.  She was started on IV Solu-Medrol.  During the hospitalization, her renal function worsened.  She was started on IV Lasix.  Nephrology was consulted.  Assessment & Plan:   COPD exacerbation Acute respiratory failure with hypoxia -Chest x-ray as above.  COVID-19/influenza/RSV PCR negative on admission. -Continue Solu-Medrol, dose decreased to 40 mg IV daily on 07/05/2023.  Continue Dulera.  Continue nebs.  Continue Zithromax. -Respiratory status improving.  Currently on room air.  Acute on chronic diastolic heart failure/restrictive cardiomyopathy Hypertension -Echo on 12/16/2022 EF of 65 to 70% with grade 3 diastolic dysfunction.  Strict input and output.  Daily weights.  Fluid restriction.   -Diuresing well.  Continue IV Lasix.  Nephrology following. -Outpatient follow-up with cardiology -Blood pressure currently on the high side.  Will resume some of the antihypertensives.  AKI on CKD stage IIIb -Baseline creatinine of 1.2-1.6.  Monitor.  -Creatinine pending for today.  Creatinine worsened to 4.96 on 07/04/2023.  Renal ultrasound negative for hydronephrosis.  IV fluids started on 07/04/2023 but subsequently discontinued and patient currently on scheduled IV Lasix. -Creatinine 3.30 today.  Nephrology following.  Monitor creatinine.  Follow  recommendations from nephrology.  History of coronary artery disease Hyperlipidemia -Stable.  Continue aspirin, Lipitor.  Coreg on hold for now.  Paroxysmal A-fib -Not on anticoagulation as an outpatient.  Continue amiodarone.  Outpatient follow-up with cardiology  Diabetes mellitus type 2 with hyperglycemia -Continue CBGs with SSI  Anemia of chronic disease -Hemoglobin stable.  Monitor intermittently.  Leukocytosis -Resolved  History of obstructive sleep apnea -Stable.  Outpatient follow-up  Morbid obesity -Outpatient follow-up  Physical deconditioning -PT recommending home with PT.   DVT prophylaxis: Subcutaneous heparin Code Status: Full Family Communication: Spoke to daughter/Anita at bedside Disposition Plan: Status is: Inpatient Remains inpatient appropriate because: Of severity of illness    Consultants: None  Procedures: None  Antimicrobials:  Anti-infectives (From admission, onward)    Start     Dose/Rate Route Frequency Ordered Stop   07/02/23 2315  azithromycin (ZITHROMAX) tablet 500 mg       Placed in "Followed by" Linked Group   500 mg Oral Daily 07/01/23 2305 07/05/23 0856   07/01/23 2304  azithromycin (ZITHROMAX) 500 mg in sodium chloride 0.9 % 250 mL IVPB       Placed in "Followed by" Linked Group   500 mg 250 mL/hr over 60 Minutes Intravenous Every 24 hours 07/01/23 2305 07/02/23 0202        Subjective: Patient seen and examined at bedside.  Feels slightly better.  Still short of breath with slight exertion.  No fever, vomiting, abdominal pain reported.   Objective: Vitals:   07/05/23 2018 07/05/23 2120 07/06/23 0408 07/06/23 0656  BP:  (!) 197/85  (!) 189/69  Pulse: 65 65  64  Resp:  18  19  Temp:  98.2 F (36.8 C)  98.4 F (36.9 C)  TempSrc:  Oral  SpO2: 98% 97%  100%  Weight:   112.9 kg   Height:        Intake/Output Summary (Last 24 hours) at 07/06/2023 0733 Last data filed at 07/05/2023 2300 Gross per 24 hour  Intake  540 ml  Output 2300 ml  Net -1760 ml   Filed Weights   07/02/23 1525 07/04/23 0429 07/06/23 0408  Weight: 110.5 kg 112.7 kg 112.9 kg    Examination:  General: On 2 L oxygen via nasal cannula currently.  No distress.  Chronically ill and deconditioned looking. ENT/neck: No JVD elevation or palpable thyromegaly noted respiratory: Decreased breath sounds at bases bilaterally with some crackles  CVS: S1-S2 heard; bradycardic Abdominal: Soft, morbidly obese, nontender, has some distention; no organomegaly, normal bowel sounds heard extremities: Lower extremity edema present bilaterally; no cyanosis CNS: Still slightly slow to respond; awake.  Poor historian.  No obvious focal deficits noted.   Lymph: No cervical lymphadenopathy  skin: No obvious ecchymosis/lesions  psych: Currently not agitated.  Affect is flat. Musculoskeletal: No obvious joint erythema/deformity    Data Reviewed: I have personally reviewed following labs and imaging studies  CBC: Recent Labs  Lab 07/01/23 1748 07/01/23 2334 07/02/23 0259 07/03/23 0926 07/05/23 0943 07/06/23 0512  WBC 13.3*  --  11.6* 19.6* 9.8 9.5  NEUTROABS  --   --   --  17.5* 8.3* 6.6  HGB 11.7* 12.2 11.3* 11.2* 11.5* 12.6  HCT 37.4 36.0 37.0 35.6* 38.4 39.6  MCV 90.8  --  91.4 93.2 95.0 91.0  PLT 325  --  314 337 342 325   Basic Metabolic Panel: Recent Labs  Lab 07/03/23 0926 07/04/23 0437 07/04/23 1716 07/05/23 0943 07/06/23 0512  NA 138 135 136 134* 138  K 4.5 4.9 5.1 4.7 4.9  CL 100 100 101 100 95*  CO2 25 26 23 25 28   GLUCOSE 162* 165* 112* 203* 121*  BUN 60* 77* 90* 96* 103*  CREATININE 3.96* 4.65* 4.96* 4.09* 3.30*  CALCIUM 8.5* 8.0* 7.8* 7.6* 8.5*  MG 2.7*  --   --  3.0*  --    GFR: Estimated Creatinine Clearance: 18.4 mL/min (A) (by C-G formula based on SCr of 3.3 mg/dL (H)). Liver Function Tests: Recent Labs  Lab 07/02/23 0259 07/03/23 0926  AST 24 16  ALT 18 17  ALKPHOS 69 65  BILITOT 0.5 0.2*  PROT 7.6  7.3  ALBUMIN 3.1* 3.1*   No results for input(s): "LIPASE", "AMYLASE" in the last 168 hours. No results for input(s): "AMMONIA" in the last 168 hours. Coagulation Profile: No results for input(s): "INR", "PROTIME" in the last 168 hours. Cardiac Enzymes: No results for input(s): "CKTOTAL", "CKMB", "CKMBINDEX", "TROPONINI" in the last 168 hours. BNP (last 3 results) No results for input(s): "PROBNP" in the last 8760 hours. HbA1C: No results for input(s): "HGBA1C" in the last 72 hours.  CBG: Recent Labs  Lab 07/05/23 0719 07/05/23 1117 07/05/23 1643 07/05/23 2121 07/06/23 0723  GLUCAP 155* 140* 177* 165* 145*   Lipid Profile: No results for input(s): "CHOL", "HDL", "LDLCALC", "TRIG", "CHOLHDL", "LDLDIRECT" in the last 72 hours. Thyroid Function Tests: No results for input(s): "TSH", "T4TOTAL", "FREET4", "T3FREE", "THYROIDAB" in the last 72 hours. Anemia Panel: No results for input(s): "VITAMINB12", "FOLATE", "FERRITIN", "TIBC", "IRON", "RETICCTPCT" in the last 72 hours. Sepsis Labs: Recent Labs  Lab 07/03/23 0926  PROCALCITON 0.23    Recent Results (from the past 240 hour(s))  Resp panel by RT-PCR (RSV, Flu A&B, Covid) Anterior Nasal  Swab     Status: None   Collection Time: 07/01/23  7:43 PM   Specimen: Anterior Nasal Swab  Result Value Ref Range Status   SARS Coronavirus 2 by RT PCR NEGATIVE NEGATIVE Final   Influenza A by PCR NEGATIVE NEGATIVE Final   Influenza B by PCR NEGATIVE NEGATIVE Final    Comment: (NOTE) The Xpert Xpress SARS-CoV-2/FLU/RSV plus assay is intended as an aid in the diagnosis of influenza from Nasopharyngeal swab specimens and should not be used as a sole basis for treatment. Nasal washings and aspirates are unacceptable for Xpert Xpress SARS-CoV-2/FLU/RSV testing.  Fact Sheet for Patients: BloggerCourse.com  Fact Sheet for Healthcare Providers: SeriousBroker.it  This test is not yet  approved or cleared by the [place] FDA and has been authorized for detection and/or diagnosis of SARS-CoV-2 by FDA under an Emergency Use Authorization (EUA). This EUA will remain in effect (meaning this test can be used) for the duration of the COVID-19 declaration under Section 564(b)(1) of the Act, 21 U.S.C. section 360bbb-3(b)(1), unless the authorization is terminated or revoked.     Resp Syncytial Virus by PCR NEGATIVE NEGATIVE Final    Comment: (NOTE) Fact Sheet for Patients: BloggerCourse.com  Fact Sheet for Healthcare Providers: SeriousBroker.it  This test is not yet approved or cleared by the [place] FDA and has been authorized for detection and/or diagnosis of SARS-CoV-2 by FDA under an Emergency Use Authorization (EUA). This EUA will remain in effect (meaning this test can be used) for the duration of the COVID-19 declaration under Section 564(b)(1) of the Act, 21 U.S.C. section 360bbb-3(b)(1), unless the authorization is terminated or revoked.  Performed at [facility], [address], [place], [place] [address]   Culture, blood (Routine X 2) w Reflex to ID Panel     Status: None (Preliminary result)   Collection Time: 07/01/23 11:43 PM   Specimen: BLOOD  Result Value Ref Range Status   Specimen Description BLOOD LEFT ANTECUBITAL  Final   Special Requests   Final    BOTTLES DRAWN AEROBIC AND ANAEROBIC Blood Culture adequate volume   Culture   Final    NO GROWTH 3 DAYS Performed at [facility], [address], [place], [place] [address]    Report Status PENDING  Incomplete  Culture, blood (Routine X 2) w Reflex to ID Panel     Status: None (Preliminary result)   Collection Time: 07/01/23 11:48 PM   Specimen: BLOOD RIGHT HAND  Result Value Ref Range Status   Specimen Description BLOOD RIGHT HAND  Final   Special Requests   Final    BOTTLES DRAWN AEROBIC AND ANAEROBIC Blood Culture  adequate volume   Culture   Final    NO GROWTH 3 DAYS Performed at [facility], [address] [place], [place] [address]    Report Status PENDING  Incomplete         Radiology Studies: DG CHEST PORT 1 VIEW  Result Date: 07/05/2023 CLINICAL DATA:  Dyspnea, cough. EXAM: PORTABLE CHEST 1 VIEW COMPARISON:  Radiograph 07/01/2023. FINDINGS: Stable cardiomegaly. Unchanged mediastinal contours. Aortic atherosclerosis. Slight worsening pulmonary vascular congestion. No confluent consolidation. No significant pleural effusion. No pneumothorax. IMPRESSION: Slight worsening pulmonary vascular congestion. Stable cardiomegaly. Electronically Signed   By: Narda Rutherford M.D.   On: 07/05/2023 09:24   US RENAL  Result Date: 07/04/2023 CLINICAL DATA:  AKI EXAM: RENAL / URINARY TRACT ULTRASOUND COMPLETE COMPARISON:  Renal ultrasound 12/18/2022 FINDINGS: Right Kidney:  Renal measurements: 10.3 cm x 4.0 cm x 5.0 cm = volume: 107 mL. Echogenicity within normal limits. No mass or hydronephrosis visualized. Left Kidney: Renal measurements: 11.0 cm x 5.1 cm x 4.5 cm = volume: 132 mL. Parenchymal echogenicity is normal. There is 2.5 cm cyst requiring no specific imaging follow-up. There is no hydronephrosis. Bladder: Not identified. Other: None. IMPRESSION: Left renal cyst requiring no specific imaging follow-up. Otherwise, unremarkable renal ultrasound. Electronically Signed   By: Lesia Hausen M.D.   On: 07/04/2023 13:24        Scheduled Meds:  amiodarone  100 mg Oral Daily   aspirin  81 mg Oral Daily   atorvastatin  40 mg Oral Daily   clopidogrel  75 mg Oral Daily   ferrous sulfate  325 mg Oral BID WC   fluticasone  1 spray Each Nare Daily   furosemide  40 mg Intravenous Q12H   heparin  5,000 Units Subcutaneous Q8H   insulin aspart  0-5 Units Subcutaneous QHS   insulin aspart  0-6 Units Subcutaneous TID WC   methylPREDNISolone (SOLU-MEDROL) injection  40 mg Intravenous Daily    mometasone-formoterol  2 puff Inhalation BID   pantoprazole  40 mg Oral Daily   sodium chloride flush  3 mL Intravenous Q12H   Continuous Infusions:  sodium chloride            Glade Lloyd, MD [facility] 07/06/2023, 7:33 AM

## 2023-07-06 NOTE — Progress Notes (Signed)
Physical Therapy Treatment Patient Details Name: Sherry Fitzpatrick MRN: [id number] DOB: 04-03-1951 Today's Date: 07/06/2023   History of Present Illness 73 y.o. female who presents on 07/01/23 with worsening SOB and saturation 75% with wheezing. PMH: cognitive impairment, diabetes, diastolic CHF, stroke, A-fib, OSA, GERD, anemia, obesity, hyperlipidemia, CKD 3, asthma, anxiety, depression    PT Comments  Pt tolerates treatment well, ambulating for increased distances and tolerating increased activity overall compared to yesterdays session. Pt is able to ambulate for increased distances and demonstrates some improvement in transfer quality, although still requires verbal cueing. Pt will benefit from continued frequent ambulation in an effort to improve activity tolerance and reduce falls risk. PT updates recommendations to HHPT.    If plan is discharge home, recommend the following: A little help with walking and/or transfers;A lot of help with bathing/dressing/bathroom;Assistance with cooking/housework;Assist for transportation;Help with stairs or ramp for entrance   Can travel by private vehicle     Yes  Equipment Recommendations  None recommended by PT    Recommendations for Other Services       Precautions / Restrictions Precautions Precautions: Fall Precaution Comments: monitor O2 sats Restrictions Weight Bearing Restrictions: No     Mobility  Bed Mobility               General bed mobility comments: received sitting at edge of bed    Transfers Overall transfer level: Needs assistance Equipment used: Rollator (4 wheels) Transfers: Sit to/from Stand Sit to Stand: Contact guard assist           General transfer comment: verbal cues for hand placement and brake management of rollator    Ambulation/Gait Ambulation/Gait assistance: Contact guard assist Gait Distance (Feet): 60 Feet (additional trial of 50') Assistive device: Rollator (4 wheels) Gait  Pattern/deviations: Step-through pattern Gait velocity: reduced Gait velocity interpretation: <1.8 ft/sec, indicate of risk for recurrent falls   General Gait Details: slowed step-through gait with reduced stride length   Stairs             Wheelchair Mobility     Tilt Bed    Modified Rankin (Stroke Patients Only)       Balance Overall balance assessment: Needs assistance Sitting-balance support: No upper extremity supported, Feet supported Sitting balance-Leahy Scale: Good     Standing balance support: Single extremity supported, Reliant on assistive device for balance Standing balance-Leahy Scale: Poor                              Cognition Arousal: Alert Behavior During Therapy: WFL for tasks assessed/performed Overall Cognitive Status: Impaired/Different from baseline Area of Impairment: Problem solving, Memory                     Memory: Decreased short-term memory       Problem Solving: Slow processing          Exercises      General Comments General comments (skin integrity, edema, etc.): pt on RA upon PT arrival, sats at 86%. Pt requiring 2L [place] to maintain sats in 90s.      Pertinent Vitals/Pain Pain Assessment Pain Assessment: No/denies pain    Home Living                          Prior Function            PT Goals (current goals can now be  found in the care plan section) Acute Rehab PT Goals Patient Stated Goal: return home Progress towards PT goals: Progressing toward goals    Frequency    Min 1X/week      PT Plan      Co-evaluation              AM-PAC PT "6 Clicks" Mobility   Outcome Measure  Help needed turning from your back to your side while in a flat bed without using bedrails?: None Help needed moving from lying on your back to sitting on the side of a flat bed without using bedrails?: A Little Help needed moving to and from a bed to a chair (including a wheelchair)?: A  Little Help needed standing up from a chair using your arms (e.g., wheelchair or bedside chair)?: A Little Help needed to walk in hospital room?: A Little Help needed climbing 3-5 steps with a railing? : A Lot 6 Click Score: 18    End of Session Equipment Utilized During Treatment: Oxygen Activity Tolerance: Patient limited by fatigue Patient left: in chair;with call bell/phone within reach;with family/visitor present Nurse Communication: Mobility status PT Visit Diagnosis: Muscle weakness (generalized) (M62.81);Difficulty in walking, not elsewhere classified (R26.2)     Time: 1610-9604 PT Time Calculation (min) (ACUTE ONLY): 39 min  Charges:    $Gait Training: 23-37 mins $Therapeutic Activity: 8-22 mins PT General Charges $$ ACUTE PT VISIT: 1 Visit                     Arlyss Gandy, PT, DPT Acute Rehabilitation Office [phone]    Arlyss Gandy 07/06/2023, 12:39 PM

## 2023-07-07 DIAGNOSIS — J441 Chronic obstructive pulmonary disease with (acute) exacerbation: Secondary | ICD-10-CM | POA: Diagnosis not present

## 2023-07-07 DIAGNOSIS — J9601 Acute respiratory failure with hypoxia: Secondary | ICD-10-CM | POA: Diagnosis not present

## 2023-07-07 LAB — CULTURE, BLOOD (ROUTINE X 2)
Culture: NO GROWTH
Culture: NO GROWTH
Special Requests: ADEQUATE
Special Requests: ADEQUATE

## 2023-07-07 LAB — BASIC METABOLIC PANEL
Anion gap: 9 (ref 5–15)
BUN: 80 mg/dL — ABNORMAL HIGH (ref 8–23)
CO2: 31 mmol/L (ref 22–32)
Calcium: 8.4 mg/dL — ABNORMAL LOW (ref 8.9–10.3)
Chloride: 100 mmol/L (ref 98–111)
Creatinine, Ser: 2.32 mg/dL — ABNORMAL HIGH (ref 0.44–1.00)
GFR, Estimated: 22 mL/min — ABNORMAL LOW (ref >60)
Glucose, Bld: 156 mg/dL — ABNORMAL HIGH (ref 70–99)
Potassium: 4.4 mmol/L (ref 3.5–5.1)
Sodium: 140 mmol/L (ref 135–145)

## 2023-07-07 LAB — GLUCOSE, CAPILLARY
Glucose-Capillary: 118 mg/dL — ABNORMAL HIGH (ref 70–99)
Glucose-Capillary: 166 mg/dL — ABNORMAL HIGH (ref 70–99)
Glucose-Capillary: 142 mg/dL — ABNORMAL HIGH (ref 70–99)
Glucose-Capillary: 203 mg/dL — ABNORMAL HIGH (ref 70–99)
Glucose-Capillary: 199 mg/dL — ABNORMAL HIGH (ref 70–99)

## 2023-07-07 LAB — C3 COMPLEMENT: C3 Complement: 191 mg/dL — ABNORMAL HIGH (ref 82–167)

## 2023-07-07 LAB — MAGNESIUM: Magnesium: 2.9 mg/dL — ABNORMAL HIGH (ref 1.7–2.4)

## 2023-07-07 LAB — C4 COMPLEMENT: Complement C4, Body Fluid: 43 mg/dL — ABNORMAL HIGH (ref 12–38)

## 2023-07-07 MED ORDER — PREDNISONE 20 MG PO TABS
40.0000 mg | ORAL_TABLET | Freq: Every day | ORAL | Status: DC
  Administered 2023-07-07 – 2023-07-09 (×3): 40 mg via ORAL
  Filled 2023-07-07 (×3): qty 2

## 2023-07-07 MED ORDER — ISOSORBIDE MONONITRATE ER 60 MG PO TB24
120.0000 mg | ORAL_TABLET | Freq: Every day | ORAL | Status: DC
  Administered 2023-07-07 – 2023-07-09 (×3): 120 mg via ORAL
  Filled 2023-07-07 (×3): qty 2

## 2023-07-07 NOTE — Progress Notes (Addendum)
[facility] Progress Note    Assessment/ Plan:   AKI on CKD3 -Baseline Cr entirely unknown but reported to be around 1.2-1.6. Suspecting AKI is multifactorial: soft/low Bps causing decreased renal refusion and cardiorenal? Agree with holding ARB. Recommend repeat echo -No obstruction on imaging.  UA with 6-10 RBCs per high-power field. ANA negative and ANCA serologies pending -agree with lasix 40mg  BID for now,will continue just for today and reassess thereafter -Cr down to 3.3 yesterday. BUN on the rise, possibly steroids contributing? -no indication for renal replacement therapy at this time -Avoid nephrotoxic medications including NSAIDs and iodinated intravenous contrast exposure unless the latter is absolutely indicated.  Preferred narcotic agents for pain control are hydromorphone, fentanyl, and methadone. Morphine should not be used. Avoid Baclofen and avoid oral sodium phosphate and magnesium citrate based laxatives / bowel preps. Continue strict Input and Output monitoring. Will monitor the patient closely with you and intervene or adjust therapy as indicated by changes in clinical status/labs    Acute on chronic diastolic CHF exacerbation -repeat CXR Friday with slightly worsened vascular congestion. Monitor strict I/O, daily weights -resp status better today it seems, hold on lasix for today -recommend repeat echo   AHRF, Acute on chronic COPD exacerbation -per primary, s/p solumedrol, now on prednisone   HTN -BP was on the soft side initially, now hypertensive, some of her home meds have been resumed by primary service, titrate meds accordingly   DM2 with hyperglycemia -per primary   Anemia -transfuse prn for hgb <7 -Hgb stable 12.6 yesterday  Subjective:   Patient seen and examined bedside. No acute events. Upon my encounter, she reported chest pain (mid chest), she has discussed this with her hospitalist . She reports that she feels like her breathing is  better today Denies any n/v/dysgeusia/loss of appetite Labs pending, discussed w/ staff Uop 1700+2 unmeasured voids   Objective:   BP (!) 179/83 (BP Location: Left Wrist)   Pulse (!) 56   Temp (!) 97.4 F (36.3 C) (Oral)   Resp 18   Ht 5\' 4"  (1.626 m)   Wt 109.3 kg   SpO2 95%   BMI 41.36 kg/m   Intake/Output Summary (Last 24 hours) at 07/07/2023 1143 Last data filed at 07/07/2023 0802 Gross per 24 hour  Intake 240 ml  Output 1900 ml  Net -1660 ml   Weight change:   Physical Exam: Gen: NAD, laying flat in bed CVS: RRR Resp: poor air exchange bilaterally, no w/r/r/c Abd: obese, soft Ext: no edema b/l Les Neuro: awake, alert  Imaging: No results found.  Labs: BMET Recent Labs  Lab 07/01/23 1748 07/01/23 2334 07/02/23 0259 07/03/23 0926 07/04/23 0437 07/04/23 1716 07/05/23 0943 07/06/23 0512  NA 137 138 137 138 135 136 134* 138  K 3.7 4.3 4.1 4.5 4.9 5.1 4.7 4.9  CL 99  --  100 100 100 101 100 95*  CO2 28  --  26 25 26 23 25 28   GLUCOSE 143*  --  216* 162* 165* 112* 203* 121*  BUN 12  --  21 60* 77* 90* 96* 103*  CREATININE 1.21*  --  2.12* 3.96* 4.65* 4.96* 4.09* 3.30*  CALCIUM 8.3*  --  8.3* 8.5* 8.0* 7.8* 7.6* 8.5*   CBC Recent Labs  Lab 07/02/23 0259 07/03/23 0926 07/05/23 0943 07/06/23 0512  WBC 11.6* 19.6* 9.8 9.5  NEUTROABS  --  17.5* 8.3* 6.6  HGB 11.3* 11.2* 11.5* 12.6  HCT 37.0 35.6* 38.4 39.6  MCV 91.4 93.2 95.0 91.0  PLT 314 337 342 325    Medications:     amiodarone  100 mg Oral Daily   amLODipine  10 mg Oral Daily   aspirin  81 mg Oral Daily   atorvastatin  40 mg Oral Daily   carvedilol  6.25 mg Oral BID WC   clopidogrel  75 mg Oral Daily   ferrous sulfate  325 mg Oral BID WC   fluticasone  1 spray Each Nare Daily   heparin  5,000 Units Subcutaneous Q8H   hydrALAZINE  25 mg Oral Q8H   insulin aspart  0-5 Units Subcutaneous QHS   insulin aspart  0-6 Units Subcutaneous TID WC   isosorbide mononitrate  120 mg Oral Daily    mometasone-formoterol  2 puff Inhalation BID   pantoprazole  40 mg Oral Daily   predniSONE  40 mg Oral Q breakfast   sodium chloride flush  3 mL Intravenous Q12H    Anthony Sar, MD [facility] 07/07/2023, 11:43 AM

## 2023-07-07 NOTE — Progress Notes (Signed)
PROGRESS NOTE    Sherry Fitzpatrick  ZHY:[id number] DOB: 1951-06-04 DOA: 07/01/2023 PCP: Shon Hale, MD   Brief Narrative:   72 y.o. female with medical history significant of COPD, diabetes type 2, history of CAD, grade 3 diastolic heart failure, restrictive cardiomyopathy, CVA, atrial fibrillation not on anticoagulation, obstructive sleep apnea, GERD,  chronic anemia, obesity, hyperlipidemia and CKD stage 3B presented with worsening shortness of breath.  On presentation, she was saturating 75% on room air along with wheezing.  Chest x-ray showed central vascular congestion without significant edema.  COVID-19/influenza/RSV PCR were negative.  She was started on IV Solu-Medrol.  During the hospitalization, her renal function worsened.  She was started on IV Lasix.  Nephrology was consulted.  Assessment & Plan:   COPD exacerbation Acute respiratory failure with hypoxia -Chest x-ray as above.  COVID-19/influenza/RSV PCR negative on admission. -Switch Solu-Medrol to prednisone 40 mg daily.  Continue Dulera.  Continue nebs.  Has completed a course of Zithromax. -Respiratory status improving.  Currently on 1 L oxygen by nasal cannula.  Acute on chronic diastolic heart failure/restrictive cardiomyopathy Hypertension -Echo on 12/16/2022 EF of 65 to 70% with grade 3 diastolic dysfunction.  Strict input and output.  Daily weights.  Fluid restriction.   -Diuresing well.  Continue IV Lasix.  Nephrology following. -Outpatient follow-up with cardiology -Blood pressure currently on the high side.  Continue amlodipine, Coreg, Lasix and hydralazine.  Resume Imdur.  AKI on CKD stage IIIb -Baseline creatinine of 1.2-1.6.  Monitor.  -Creatinine pending for today.  Creatinine worsened to 4.96 on 07/04/2023.  Renal ultrasound negative for hydronephrosis.  IV fluids started on 07/04/2023 but subsequently discontinued and patient currently on scheduled IV Lasix. -Creatinine 3.30 on 07/06/2023.  Labs pending  for today.  Nephrology following.  Monitor creatinine.  Follow recommendations from nephrology.  History of coronary artery disease Hyperlipidemia -Stable.  Continue aspirin, Lipitor.  Continue Coreg.  Paroxysmal A-fib -Not on anticoagulation as an outpatient.  Continue amiodarone and Coreg.  Outpatient follow-up with cardiology  Diabetes mellitus type 2 with hyperglycemia -Continue CBGs with SSI  Anemia of chronic disease -Hemoglobin stable.  Monitor intermittently.  Leukocytosis -Resolved  History of obstructive sleep apnea -Stable.  Outpatient follow-up  Morbid obesity -Outpatient follow-up  Physical deconditioning -PT recommending home with PT.   DVT prophylaxis: Subcutaneous heparin Code Status: Full Family Communication: Spoke to daughter/Anita at bedside on 07/06/2023 Disposition Plan: Status is: Inpatient Remains inpatient appropriate because: Of severity of illness    Consultants: None  Procedures: None  Antimicrobials:  Anti-infectives (From admission, onward)    Start     Dose/Rate Route Frequency Ordered Stop   07/02/23 2315  azithromycin (ZITHROMAX) tablet 500 mg       Placed in "Followed by" Linked Group   500 mg Oral Daily 07/01/23 2305 07/05/23 0856   07/01/23 2304  azithromycin (ZITHROMAX) 500 mg in sodium chloride 0.9 % 250 mL IVPB       Placed in "Followed by" Linked Group   500 mg 250 mL/hr over 60 Minutes Intravenous Every 24 hours 07/01/23 2305 07/02/23 0202        Subjective: Patient seen and examined at bedside.  No chest pain, fever, abdominal pain reported.  Continues to feel weak but feeling slightly better.   Objective: Vitals:   07/06/23 1941 07/06/23 2336 07/07/23 0200 07/07/23 0357  BP:  (!) 224/89 (!) 193/87 (!) 174/69  Pulse: 60 64 65 (!) 54  Resp:  16  19  Temp:  98.6 F (37 C) 98.3 F (36.8 C) 97.8 F (36.6 C)  TempSrc:  Oral Oral Oral  SpO2: 91% 99%  96%  Weight:      Height:        Intake/Output Summary  (Last 24 hours) at 07/07/2023 0750 Last data filed at 07/07/2023 0500 Gross per 24 hour  Intake 490 ml  Output 1700 ml  Net -1210 ml   Filed Weights   07/02/23 1525 07/04/23 0429 07/06/23 0408  Weight: 110.5 kg 112.7 kg 112.9 kg    Examination:  General: No acute distress.  On 1 L oxygen by nasal cannula.  Chronically ill and deconditioned looking. ENT/neck: No neck masses or JVD elevation noted  respiratory: Bilateral decreased breath sounds at bases with scattered crackles CVS: Currently rate controlled; S1 and S2 are heard Abdominal: Soft, morbidly obese, nontender, distended slightly; no organomegaly, bowel sounds are normally heard  extremities: No clubbing; bilateral lower extremity edema present  CNS: Awake; slow to respond.  Poor historian.  No focal deficits noted  lymph: No cervical lymphadenopathy palpable skin: No obvious petechiae/rashes psych: Mostly flat affect.  Showing no signs of agitation Musculoskeletal: No obvious joint tenderness/deformity   Data Reviewed: I have personally reviewed following labs and imaging studies  CBC: Recent Labs  Lab 07/01/23 1748 07/01/23 2334 07/02/23 0259 07/03/23 0926 07/05/23 0943 07/06/23 0512  WBC 13.3*  --  11.6* 19.6* 9.8 9.5  NEUTROABS  --   --   --  17.5* 8.3* 6.6  HGB 11.7* 12.2 11.3* 11.2* 11.5* 12.6  HCT 37.4 36.0 37.0 35.6* 38.4 39.6  MCV 90.8  --  91.4 93.2 95.0 91.0  PLT 325  --  314 337 342 325   Basic Metabolic Panel: Recent Labs  Lab 07/03/23 0926 07/04/23 0437 07/04/23 1716 07/05/23 0943 07/06/23 0512  NA 138 135 136 134* 138  K 4.5 4.9 5.1 4.7 4.9  CL 100 100 101 100 95*  CO2 25 26 23 25 28   GLUCOSE 162* 165* 112* 203* 121*  BUN 60* 77* 90* 96* 103*  CREATININE 3.96* 4.65* 4.96* 4.09* 3.30*  CALCIUM 8.5* 8.0* 7.8* 7.6* 8.5*  MG 2.7*  --   --  3.0*  --    GFR: Estimated Creatinine Clearance: 18.4 mL/min (A) (by C-G formula based on SCr of 3.3 mg/dL (H)). Liver Function Tests: Recent Labs   Lab 07/02/23 0259 07/03/23 0926  AST 24 16  ALT 18 17  ALKPHOS 69 65  BILITOT 0.5 0.2*  PROT 7.6 7.3  ALBUMIN 3.1* 3.1*   No results for input(s): "LIPASE", "AMYLASE" in the last 168 hours. No results for input(s): "AMMONIA" in the last 168 hours. Coagulation Profile: No results for input(s): "INR", "PROTIME" in the last 168 hours. Cardiac Enzymes: No results for input(s): "CKTOTAL", "CKMB", "CKMBINDEX", "TROPONINI" in the last 168 hours. BNP (last 3 results) No results for input(s): "PROBNP" in the last 8760 hours. HbA1C: No results for input(s): "HGBA1C" in the last 72 hours.  CBG: Recent Labs  Lab 07/05/23 2121 07/06/23 0723 07/06/23 1150 07/06/23 1603 07/06/23 2016  GLUCAP 165* 145* 153* 172* 168*   Lipid Profile: No results for input(s): "CHOL", "HDL", "LDLCALC", "TRIG", "CHOLHDL", "LDLDIRECT" in the last 72 hours. Thyroid Function Tests: No results for input(s): "TSH", "T4TOTAL", "FREET4", "T3FREE", "THYROIDAB" in the last 72 hours. Anemia Panel: No results for input(s): "VITAMINB12", "FOLATE", "FERRITIN", "TIBC", "IRON", "RETICCTPCT" in the last 72 hours. Sepsis Labs: Recent Labs  Lab 07/03/23 0926  PROCALCITON 0.23  Recent Results (from the past 240 hour(s))  Resp panel by RT-PCR (RSV, Flu A&B, Covid) Anterior Nasal Swab     Status: None   Collection Time: 07/01/23  7:43 PM   Specimen: Anterior Nasal Swab  Result Value Ref Range Status   SARS Coronavirus 2 by RT PCR NEGATIVE NEGATIVE Final   Influenza A by PCR NEGATIVE NEGATIVE Final   Influenza B by PCR NEGATIVE NEGATIVE Final    Comment: (NOTE) The Xpert Xpress SARS-CoV-2/FLU/RSV plus assay is intended as an aid in the diagnosis of influenza from Nasopharyngeal swab specimens and should not be used as a sole basis for treatment. Nasal washings and aspirates are unacceptable for Xpert Xpress SARS-CoV-2/FLU/RSV testing.  Fact Sheet for Patients: BloggerCourse.com  Fact  Sheet for Healthcare Providers: SeriousBroker.it  This test is not yet approved or cleared by the [place] FDA and has been authorized for detection and/or diagnosis of SARS-CoV-2 by FDA under an Emergency Use Authorization (EUA). This EUA will remain in effect (meaning this test can be used) for the duration of the COVID-19 declaration under Section 564(b)(1) of the Act, 21 U.S.C. section 360bbb-3(b)(1), unless the authorization is terminated or revoked.     Resp Syncytial Virus by PCR NEGATIVE NEGATIVE Final    Comment: (NOTE) Fact Sheet for Patients: BloggerCourse.com  Fact Sheet for Healthcare Providers: SeriousBroker.it  This test is not yet approved or cleared by the [place] FDA and has been authorized for detection and/or diagnosis of SARS-CoV-2 by FDA under an Emergency Use Authorization (EUA). This EUA will remain in effect (meaning this test can be used) for the duration of the COVID-19 declaration under Section 564(b)(1) of the Act, 21 U.S.C. section 360bbb-3(b)(1), unless the authorization is terminated or revoked.  Performed at [facility], [address], [place], [place] [address]   Culture, blood (Routine X 2) w Reflex to ID Panel     Status: None (Preliminary result)   Collection Time: 07/01/23 11:43 PM   Specimen: BLOOD  Result Value Ref Range Status   Specimen Description BLOOD LEFT ANTECUBITAL  Final   Special Requests   Final    BOTTLES DRAWN AEROBIC AND ANAEROBIC Blood Culture adequate volume   Culture   Final    NO GROWTH 4 DAYS Performed at [facility], [address] [place], [place] [address]    Report Status PENDING  Incomplete  Culture, blood (Routine X 2) w Reflex to ID Panel     Status: None (Preliminary result)   Collection Time: 07/01/23 11:48 PM   Specimen: BLOOD RIGHT HAND  Result Value Ref Range Status   Specimen Description BLOOD RIGHT  HAND  Final   Special Requests   Final    BOTTLES DRAWN AEROBIC AND ANAEROBIC Blood Culture adequate volume   Culture   Final    NO GROWTH 4 DAYS Performed at [facility], [address] [place], [place] [address]    Report Status PENDING  Incomplete         Radiology Studies: No results found.      Scheduled Meds:  amiodarone  100 mg Oral Daily   amLODipine  10 mg Oral Daily   aspirin  81 mg Oral Daily   atorvastatin  40 mg Oral Daily   carvedilol  6.25 mg Oral BID WC   clopidogrel  75 mg Oral Daily   ferrous sulfate  325 mg Oral BID WC   fluticasone  1 spray Each Nare  Daily   furosemide  40 mg Intravenous Q12H   heparin  5,000 Units Subcutaneous Q8H   hydrALAZINE  25 mg Oral Q8H   insulin aspart  0-5 Units Subcutaneous QHS   insulin aspart  0-6 Units Subcutaneous TID WC   methylPREDNISolone (SOLU-MEDROL) injection  40 mg Intravenous Daily   mometasone-formoterol  2 puff Inhalation BID   pantoprazole  40 mg Oral Daily   sodium chloride flush  3 mL Intravenous Q12H   Continuous Infusions:  sodium chloride            Glade Lloyd, MD [facility] 07/07/2023, 7:50 AM

## 2023-07-08 DIAGNOSIS — J441 Chronic obstructive pulmonary disease with (acute) exacerbation: Secondary | ICD-10-CM | POA: Diagnosis not present

## 2023-07-08 LAB — GLUCOSE, CAPILLARY
Glucose-Capillary: 109 mg/dL — ABNORMAL HIGH (ref 70–99)
Glucose-Capillary: 138 mg/dL — ABNORMAL HIGH (ref 70–99)
Glucose-Capillary: 210 mg/dL — ABNORMAL HIGH (ref 70–99)
Glucose-Capillary: 132 mg/dL — ABNORMAL HIGH (ref 70–99)

## 2023-07-08 LAB — RENAL FUNCTION PANEL
Albumin: 2.9 g/dL — ABNORMAL LOW (ref 3.5–5.0)
Anion gap: 10 (ref 5–15)
BUN: 74 mg/dL — ABNORMAL HIGH (ref 8–23)
CO2: 32 mmol/L (ref 22–32)
Calcium: 8.5 mg/dL — ABNORMAL LOW (ref 8.9–10.3)
Chloride: 98 mmol/L (ref 98–111)
Creatinine, Ser: 2.06 mg/dL — ABNORMAL HIGH (ref 0.44–1.00)
GFR, Estimated: 25 mL/min — ABNORMAL LOW (ref >60)
Glucose, Bld: 115 mg/dL — ABNORMAL HIGH (ref 70–99)
Phosphorus: 3.4 mg/dL (ref 2.5–4.6)
Potassium: 3.6 mmol/L (ref 3.5–5.1)
Sodium: 140 mmol/L (ref 135–145)

## 2023-07-08 LAB — ANCA PROFILE
Anti-MPO Antibodies: <0.2 units (ref 0.0–0.9)
Anti-PR3 Antibodies: <0.2 units (ref 0.0–0.9)
Atypical P-ANCA titer: <1:20 {titer}
C-ANCA: <1:20 {titer}
P-ANCA: <1:20 {titer}

## 2023-07-08 NOTE — Progress Notes (Signed)
PROGRESS NOTE    Sherry Fitzpatrick  VWU:[id number] DOB: [birth year] DOA: 07/01/2023 PCP: Shon Hale, MD   Brief Narrative:   72 y.o. female with medical history significant of COPD, diabetes type 2, history of CAD, grade 3 diastolic heart failure, restrictive cardiomyopathy, CVA, atrial fibrillation not on anticoagulation, obstructive sleep apnea, GERD,  chronic anemia, obesity, hyperlipidemia and CKD stage 3B presented with worsening shortness of breath.  On presentation, she was saturating 75% on room air along with wheezing.  Chest x-ray showed central vascular congestion without significant edema.  COVID-19/influenza/RSV PCR were negative.  She was started on IV Solu-Medrol.  During the hospitalization, her renal function worsened.  She was started on IV Lasix.  Nephrology was consulted.  Assessment & Plan:   COPD exacerbation Acute respiratory failure with hypoxia -Chest x-ray as above.  COVID-19/influenza/RSV PCR negative on admission. -Solu-Medrol has already been switched to prednisone 40 mg daily.  Continue Dulera.  Continue nebs.  Has completed a course of Zithromax. -Respiratory status improving.  Currently on room air  Acute on chronic diastolic heart failure/restrictive cardiomyopathy Hypertension -Echo on 12/16/2022 EF of 65 to 70% with grade 3 diastolic dysfunction.  Strict input and output.  Daily weights.  Fluid restriction.   -Has diuresed well.  Lasix held on 07/07/2023 by nephrology.  Follow further nephrology recommendations. -Outpatient follow-up with cardiology -Blood pressure currently on the high side.  Continue Imdur, amlodipine, Coreg, and hydralazine.   AKI on CKD stage IIIb -Baseline creatinine of 1.2-1.6.  Monitor.  -Creatinine pending for today.  Creatinine worsened to 4.96 on 07/04/2023.  Renal ultrasound negative for hydronephrosis.  IV fluids started on 07/04/2023 but subsequently discontinued and patient subsequently treated with scheduled IV  Lasix -Creatinine 2.06 today.  Nephrology following.  Monitor creatinine.  Diuretics on hold for now.  History of coronary artery disease Hyperlipidemia -Stable.  Continue aspirin, Lipitor.  Continue Coreg.  Paroxysmal A-fib -Not on anticoagulation as an outpatient.  Continue amiodarone and Coreg.  Outpatient follow-up with cardiology  Diabetes mellitus type 2 with hyperglycemia -Continue CBGs with SSI  Anemia of chronic disease -Hemoglobin stable.  Monitor intermittently.  Leukocytosis -Resolved  History of obstructive sleep apnea -Stable.  Outpatient follow-up  Morbid obesity -Outpatient follow-up  Physical deconditioning -PT recommending home with PT.   DVT prophylaxis: Subcutaneous heparin Code Status: Full Family Communication: Spoke to daughter/Anita at bedside on 07/06/2023 Disposition Plan: Status is: Inpatient Remains inpatient appropriate because: Of severity of illness    Consultants: None  Procedures: None  Antimicrobials:  Anti-infectives (From admission, onward)    Start     Dose/Rate Route Frequency Ordered Stop   07/02/23 2315  azithromycin (ZITHROMAX) tablet 500 mg       Placed in "Followed by" Linked Group   500 mg Oral Daily 07/01/23 2305 07/05/23 0856   07/01/23 2304  azithromycin (ZITHROMAX) 500 mg in sodium chloride 0.9 % 250 mL IVPB       Placed in "Followed by" Linked Group   500 mg 250 mL/hr over 60 Minutes Intravenous Every 24 hours 07/01/23 2305 07/02/23 0202        Subjective: Patient seen and examined at bedside.  Feels better.  Denies fever, vomiting, worsening abdominal pain.  Still short of breath with exertion  Objective: Vitals:   07/07/23 0802 07/07/23 1425 07/07/23 1943 07/08/23 0528  BP:  131/72 (!) 146/74 (!) 187/76  Pulse:  66 (!) 57 (!) 58  Resp:  16 16 18   Temp:  97.8  F (36.6 C) 98 F (36.7 C) 98 F (36.7 C)  TempSrc:  Oral Oral Oral  SpO2: 95% 90% 90% 90%  Weight:      Height:        Intake/Output  Summary (Last 24 hours) at 07/08/2023 0746 Last data filed at 07/08/2023 0600 Gross per 24 hour  Intake 800 ml  Output 900 ml  Net -100 ml   Filed Weights   07/04/23 0429 07/06/23 0408 07/07/23 0758  Weight: 112.7 kg 112.9 kg 109.3 kg    Examination:  General: On room air.  No distress.  Chronically ill and deconditioned looking. ENT/neck: No obvious JVD elevation or palpable thyromegaly noted respiratory: Decreased breath sounds at bases bilaterally with some crackles  CVS: S1-S2 heard; rate currently controlled  abdominal: Soft, morbidly obese, nontender, mildly distended; no organomegaly, bowel sounds heard  extremities: Lower extremity edema present; no cyanosis CNS: Still slow to respond; alert.  Poor historian.  No focal deficits noted  lymph: No obvious lymphadenopathy palpable  skin: No obvious lesions/ecchymosis psych: Currently not agitated.  Affect is flat. Musculoskeletal: No obvious joint erythema/tenderness  Data Reviewed: I have personally reviewed following labs and imaging studies  CBC: Recent Labs  Lab 07/01/23 1748 07/01/23 2334 07/02/23 0259 07/03/23 0926 07/05/23 0943 07/06/23 0512  WBC 13.3*  --  11.6* 19.6* 9.8 9.5  NEUTROABS  --   --   --  17.5* 8.3* 6.6  HGB 11.7* 12.2 11.3* 11.2* 11.5* 12.6  HCT 37.4 36.0 37.0 35.6* 38.4 39.6  MCV 90.8  --  91.4 93.2 95.0 91.0  PLT 325  --  314 337 342 325   Basic Metabolic Panel: Recent Labs  Lab 07/03/23 0926 07/04/23 0437 07/04/23 1716 07/05/23 0943 07/06/23 0512 07/07/23 1639 07/08/23 0543  NA 138   < > 136 134* 138 140 140  K 4.5   < > 5.1 4.7 4.9 4.4 3.6  CL 100   < > 101 100 95* 100 98  CO2 25   < > 23 25 28 31  32  GLUCOSE 162*   < > 112* 203* 121* 156* 115*  BUN 60*   < > 90* 96* 103* 80* 74*  CREATININE 3.96*   < > 4.96* 4.09* 3.30* 2.32* 2.06*  CALCIUM 8.5*   < > 7.8* 7.6* 8.5* 8.4* 8.5*  MG 2.7*  --   --  3.0*  --  2.9*  --   PHOS  --   --   --   --   --   --  3.4   < > = values in  this interval not displayed.   GFR: Estimated Creatinine Clearance: 28.9 mL/min (A) (by C-G formula based on SCr of 2.06 mg/dL (H)). Liver Function Tests: Recent Labs  Lab 07/02/23 0259 07/03/23 0926 07/08/23 0543  AST 24 16  --   ALT 18 17  --   ALKPHOS 69 65  --   BILITOT 0.5 0.2*  --   PROT 7.6 7.3  --   ALBUMIN 3.1* 3.1* 2.9*   No results for input(s): "LIPASE", "AMYLASE" in the last 168 hours. No results for input(s): "AMMONIA" in the last 168 hours. Coagulation Profile: No results for input(s): "INR", "PROTIME" in the last 168 hours. Cardiac Enzymes: No results for input(s): "CKTOTAL", "CKMB", "CKMBINDEX", "TROPONINI" in the last 168 hours. BNP (last 3 results) No results for input(s): "PROBNP" in the last 8760 hours. HbA1C: No results for input(s): "HGBA1C" in the last 72 hours.  CBG: Recent Labs  Lab 07/07/23 1155 07/07/23 1623 07/07/23 2024 07/07/23 2221 07/08/23 0724  GLUCAP 166* 142* 203* 199* 109*   Lipid Profile: No results for input(s): "CHOL", "HDL", "LDLCALC", "TRIG", "CHOLHDL", "LDLDIRECT" in the last 72 hours. Thyroid Function Tests: No results for input(s): "TSH", "T4TOTAL", "FREET4", "T3FREE", "THYROIDAB" in the last 72 hours. Anemia Panel: No results for input(s): "VITAMINB12", "FOLATE", "FERRITIN", "TIBC", "IRON", "RETICCTPCT" in the last 72 hours. Sepsis Labs: Recent Labs  Lab 07/03/23 0926  PROCALCITON 0.23    Recent Results (from the past 240 hour(s))  Resp panel by RT-PCR (RSV, Flu A&B, Covid) Anterior Nasal Swab     Status: None   Collection Time: 07/01/23  7:43 PM   Specimen: Anterior Nasal Swab  Result Value Ref Range Status   SARS Coronavirus 2 by RT PCR NEGATIVE NEGATIVE Final   Influenza A by PCR NEGATIVE NEGATIVE Final   Influenza B by PCR NEGATIVE NEGATIVE Final    Comment: (NOTE) The Xpert Xpress SARS-CoV-2/FLU/RSV plus assay is intended as an aid in the diagnosis of influenza from Nasopharyngeal swab specimens  and should not be used as a sole basis for treatment. Nasal washings and aspirates are unacceptable for Xpert Xpress SARS-CoV-2/FLU/RSV testing.  Fact Sheet for Patients: BloggerCourse.com  Fact Sheet for Healthcare Providers: SeriousBroker.it  This test is not yet approved or cleared by the [place] FDA and has been authorized for detection and/or diagnosis of SARS-CoV-2 by FDA under an Emergency Use Authorization (EUA). This EUA will remain in effect (meaning this test can be used) for the duration of the COVID-19 declaration under Section 564(b)(1) of the Act, 21 U.S.C. section 360bbb-3(b)(1), unless the authorization is terminated or revoked.     Resp Syncytial Virus by PCR NEGATIVE NEGATIVE Final    Comment: (NOTE) Fact Sheet for Patients: BloggerCourse.com  Fact Sheet for Healthcare Providers: SeriousBroker.it  This test is not yet approved or cleared by the [place] FDA and has been authorized for detection and/or diagnosis of SARS-CoV-2 by FDA under an Emergency Use Authorization (EUA). This EUA will remain in effect (meaning this test can be used) for the duration of the COVID-19 declaration under Section 564(b)(1) of the Act, 21 U.S.C. section 360bbb-3(b)(1), unless the authorization is terminated or revoked.  Performed at [facility], [address], [place], [place] [address]   Culture, blood (Routine X 2) w Reflex to ID Panel     Status: None   Collection Time: 07/01/23 11:43 PM   Specimen: BLOOD  Result Value Ref Range Status   Specimen Description BLOOD LEFT ANTECUBITAL  Final   Special Requests   Final    BOTTLES DRAWN AEROBIC AND ANAEROBIC Blood Culture adequate volume   Culture   Final    NO GROWTH 5 DAYS Performed at [facility], [address] [place], [place] [address]    Report Status 07/07/2023 FINAL  Final  Culture, blood  (Routine X 2) w Reflex to ID Panel     Status: None   Collection Time: 07/01/23 11:48 PM   Specimen: BLOOD RIGHT HAND  Result Value Ref Range Status   Specimen Description BLOOD RIGHT HAND  Final   Special Requests   Final    BOTTLES DRAWN AEROBIC AND ANAEROBIC Blood Culture adequate volume   Culture   Final    NO GROWTH 5 DAYS Performed at [facility], [address], [place], [place] [address]    Report Status 07/07/2023 FINAL  Final         Radiology Studies: No results found.      Scheduled Meds:  amiodarone  100 mg Oral Daily   amLODipine  10 mg Oral Daily   aspirin  81 mg Oral Daily   atorvastatin  40 mg Oral Daily   carvedilol  6.25 mg Oral BID WC   clopidogrel  75 mg Oral Daily   ferrous sulfate  325 mg Oral BID WC   fluticasone  1 spray Each Nare Daily   heparin  5,000 Units Subcutaneous Q8H   hydrALAZINE  25 mg Oral Q8H   insulin aspart  0-5 Units Subcutaneous QHS   insulin aspart  0-6 Units Subcutaneous TID WC   isosorbide mononitrate  120 mg Oral Daily   mometasone-formoterol  2 puff Inhalation BID   pantoprazole  40 mg Oral Daily   predniSONE  40 mg Oral Q breakfast   sodium chloride flush  3 mL Intravenous Q12H   Continuous Infusions:  sodium chloride            Glade Lloyd, MD [facility] 07/08/2023, 7:46 AM

## 2023-07-08 NOTE — Progress Notes (Signed)
Physical Therapy Treatment Patient Details Name: Sherry Fitzpatrick MRN: [id number] DOB: [birth year] Today's Date: 07/08/2023   History of Present Illness 72 y.o. female who presents on 07/01/23 with worsening SOB and saturation 75% with wheezing. PMH: cognitive impairment, diabetes, diastolic CHF, stroke, A-fib, OSA, GERD, anemia, obesity, hyperlipidemia, CKD 3, asthma, anxiety, depression    PT Comments  Pt tolerates treatment well, with much improved ambulation tolerance. Pt sats well when mobilizing on room air, 92-95% when ambulating this session. PT does note intermittent desaturation when in supine and resting pre-mobility, however pleth reading appears unreliable at times. PT encourages continued frequent mobilization in an effort to improve activity tolerance and to restore the pt's prior level of function.    If plan is discharge home, recommend the following: A little help with walking and/or transfers;A lot of help with bathing/dressing/bathroom;Assistance with cooking/housework;Assist for transportation;Help with stairs or ramp for entrance   Can travel by private vehicle     Yes  Equipment Recommendations  None recommended by PT    Recommendations for Other Services       Precautions / Restrictions Precautions Precautions: Fall Precaution Comments: monitor SpO2 sats Restrictions Weight Bearing Restrictions: No     Mobility  Bed Mobility Overal bed mobility: Modified Independent                  Transfers Overall transfer level: Needs assistance Equipment used: Rollator (4 wheels) Transfers: Sit to/from Stand Sit to Stand: Supervision           General transfer comment: increased time    Ambulation/Gait Ambulation/Gait assistance: Supervision Gait Distance (Feet): 200 Feet Assistive device: Rollator (4 wheels) Gait Pattern/deviations: Step-through pattern Gait velocity: reduced Gait velocity interpretation: <1.8 ft/sec, indicate of risk for recurrent  falls   General Gait Details: slowed step-through gait, one brief standing rest break   Stairs             Wheelchair Mobility     Tilt Bed    Modified Rankin (Stroke Patients Only)       Balance Overall balance assessment: Needs assistance Sitting-balance support: No upper extremity supported, Feet supported Sitting balance-Leahy Scale: Good     Standing balance support: Single extremity supported, Reliant on assistive device for balance Standing balance-Leahy Scale: Poor                              Cognition Arousal: Alert Behavior During Therapy: WFL for tasks assessed/performed Overall Cognitive Status: Within Functional Limits for tasks assessed                                          Exercises      General Comments General comments (skin integrity, edema, etc.): pt on RA upon PT arrival with sats 88-91% at rest in supine. When sitting or mobilizing pt sats 92-95% on RA.      Pertinent Vitals/Pain Pain Assessment Pain Assessment: No/denies pain    Home Living                          Prior Function            PT Goals (current goals can now be found in the care plan section) Acute Rehab PT Goals Patient Stated Goal: return home Progress towards PT goals: Progressing  toward goals    Frequency    Min 1X/week      PT Plan      Co-evaluation              AM-PAC PT "6 Clicks" Mobility   Outcome Measure  Help needed turning from your back to your side while in a flat bed without using bedrails?: None Help needed moving from lying on your back to sitting on the side of a flat bed without using bedrails?: None Help needed moving to and from a bed to a chair (including a wheelchair)?: A Little Help needed standing up from a chair using your arms (e.g., wheelchair or bedside chair)?: A Little Help needed to walk in hospital room?: A Little Help needed climbing 3-5 steps with a railing? : A  Lot 6 Click Score: 19    End of Session   Activity Tolerance: Patient tolerated treatment well Patient left: in chair;with call bell/phone within reach Nurse Communication: Mobility status PT Visit Diagnosis: Muscle weakness (generalized) (M62.81);Difficulty in walking, not elsewhere classified (R26.2)     Time: 5284-1324 PT Time Calculation (min) (ACUTE ONLY): 17 min  Charges:    $Gait Training: 8-22 mins PT General Charges $$ ACUTE PT VISIT: 1 Visit                     Arlyss Gandy, PT, DPT Acute Rehabilitation Office [phone]    Arlyss Gandy 07/08/2023, 11:15 AM

## 2023-07-08 NOTE — Progress Notes (Signed)
Patient ID: Sherry Fitzpatrick, female   DOB: 12-31-1950, 72 y.o.   MRN: [id number] S: Feeling well today, no complaints.  O:BP (!) 161/71 (BP Location: Left Arm)   Pulse 67   Temp 98.8 F (37.1 C) (Oral)   Resp 18   Ht 5\' 4"  (1.626 m)   Wt 109.3 kg   SpO2 92%   BMI 41.36 kg/m   Intake/Output Summary (Last 24 hours) at 07/08/2023 1027 Last data filed at 07/08/2023 0900 Gross per 24 hour  Intake 790 ml  Output 700 ml  Net 90 ml   Intake/Output: I/O last 3 completed shifts: In: 800 [P.O.:800] Out: 2200 [Urine:2200]  Intake/Output this shift:  Total I/O In: 240 [P.O.:240] Out: -  Weight change:  Gen:NAD CVS: RRR Resp: CTA Abd:+BS, soft, NT/ND Ext: no edema  Recent Labs  Lab 07/02/23 0259 07/03/23 0926 07/04/23 0437 07/04/23 1716 07/05/23 0943 07/06/23 0512 07/07/23 1639 07/08/23 0543  NA 137 138 135 136 134* 138 140 140  K 4.1 4.5 4.9 5.1 4.7 4.9 4.4 3.6  CL 100 100 100 101 100 95* 100 98  CO2 26 25 26 23 25 28 31  32  GLUCOSE 216* 162* 165* 112* 203* 121* 156* 115*  BUN 21 60* 77* 90* 96* 103* 80* 74*  CREATININE 2.12* 3.96* 4.65* 4.96* 4.09* 3.30* 2.32* 2.06*  ALBUMIN 3.1* 3.1*  --   --   --   --   --  2.9*  CALCIUM 8.3* 8.5* 8.0* 7.8* 7.6* 8.5* 8.4* 8.5*  PHOS  --   --   --   --   --   --   --  3.4  AST 24 16  --   --   --   --   --   --   ALT 18 17  --   --   --   --   --   --    Liver Function Tests: Recent Labs  Lab 07/02/23 0259 07/03/23 0926 07/08/23 0543  AST 24 16  --   ALT 18 17  --   ALKPHOS 69 65  --   BILITOT 0.5 0.2*  --   PROT 7.6 7.3  --   ALBUMIN 3.1* 3.1* 2.9*   No results for input(s): "LIPASE", "AMYLASE" in the last 168 hours. No results for input(s): "AMMONIA" in the last 168 hours. CBC: Recent Labs  Lab 07/01/23 1748 07/01/23 2334 07/02/23 0259 07/03/23 0926 07/05/23 0943 07/06/23 0512  WBC 13.3*  --  11.6* 19.6* 9.8 9.5  NEUTROABS  --   --   --  17.5* 8.3* 6.6  HGB 11.7*   < > 11.3* 11.2* 11.5* 12.6  HCT 37.4   < > 37.0  35.6* 38.4 39.6  MCV 90.8  --  91.4 93.2 95.0 91.0  PLT 325  --  314 337 342 325   < > = values in this interval not displayed.   Cardiac Enzymes: No results for input(s): "CKTOTAL", "CKMB", "CKMBINDEX", "TROPONINI" in the last 168 hours. CBG: Recent Labs  Lab 07/07/23 1155 07/07/23 1623 07/07/23 2024 07/07/23 2221 07/08/23 0724  GLUCAP 166* 142* 203* 199* 109*    Iron Studies: No results for input(s): "IRON", "TIBC", "TRANSFERRIN", "FERRITIN" in the last 72 hours. Studies/Results: No results found.  amiodarone  100 mg Oral Daily   amLODipine  10 mg Oral Daily   aspirin  81 mg Oral Daily   atorvastatin  40 mg Oral Daily   carvedilol  6.25 mg  Oral BID WC   clopidogrel  75 mg Oral Daily   ferrous sulfate  325 mg Oral BID WC   fluticasone  1 spray Each Nare Daily   heparin  5,000 Units Subcutaneous Q8H   hydrALAZINE  25 mg Oral Q8H   insulin aspart  0-5 Units Subcutaneous QHS   insulin aspart  0-6 Units Subcutaneous TID WC   isosorbide mononitrate  120 mg Oral Daily   mometasone-formoterol  2 puff Inhalation BID   pantoprazole  40 mg Oral Daily   predniSONE  40 mg Oral Q breakfast   sodium chloride flush  3 mL Intravenous Q12H    BMET    Component Value Date/Time   NA 140 07/08/2023 0543   K 3.6 07/08/2023 0543   CL 98 07/08/2023 0543   CO2 32 07/08/2023 0543   GLUCOSE 115 (H) 07/08/2023 0543   BUN 74 (H) 07/08/2023 0543   CREATININE 2.06 (H) 07/08/2023 0543   CALCIUM 8.5 (L) 07/08/2023 0543   GFRNONAA 25 (L) 07/08/2023 0543   GFRAA 60 (L) 06/09/2020 0729   CBC    Component Value Date/Time   WBC 9.5 07/06/2023 0512   RBC 4.35 07/06/2023 0512   HGB 12.6 07/06/2023 0512   HCT 39.6 07/06/2023 0512   PLT 325 07/06/2023 0512   MCV 91.0 07/06/2023 0512   MCH 29.0 07/06/2023 0512   MCHC 31.8 07/06/2023 0512   RDW 16.2 (H) 07/06/2023 0512   LYMPHSABS 1.6 07/06/2023 0512   MONOABS 1.2 (H) 07/06/2023 0512   EOSABS 0.0 07/06/2023 0512   BASOSABS 0.0 07/06/2023  0512     Assessment/Plan:  AKI/CKD stage IIIb - (baseline Scr 1.2-1.6) likely multifactorial in setting of low BP's further complicated by acute on chronic diastolic CHF (cardiorenal syndrome) with concomitant ARB therapy.  ARB held and Scr has slowly improved since admission.  Peak Scr 4.96 now down to 2.06.  UA with microscopic hematuria.  ANA negative, normal complements, ANCA pending.  Responding to Lasix 40 mg bid.  Continue with current regimen for now.  Acute on chronic diastolic CHF - grade III diastolic dysfunction. UOP 2300 overnight.  Lasix held yesterday.  Continue to follow for now.  Acute hypoxic respiratory failure - due to copd exacerbation.  Viral panel negative.  Solumedrol per primary and completed course of Zithromax.   H/o CAD - stable.  HTN - Bp's improved and hypertensive.  Continue to hold avapro for now DM2 - per primary Paroxysmal atrial fib- not on anticoagulation.  Continue coreg and amiodarone.  Irena Cords, MD [facility]

## 2023-07-09 DIAGNOSIS — J441 Chronic obstructive pulmonary disease with (acute) exacerbation: Secondary | ICD-10-CM | POA: Diagnosis not present

## 2023-07-09 LAB — BASIC METABOLIC PANEL
Anion gap: 10 (ref 5–15)
BUN: 64 mg/dL — ABNORMAL HIGH (ref 8–23)
CO2: 32 mmol/L (ref 22–32)
Calcium: 8.9 mg/dL (ref 8.9–10.3)
Chloride: 101 mmol/L (ref 98–111)
Creatinine, Ser: 1.88 mg/dL — ABNORMAL HIGH (ref 0.44–1.00)
GFR, Estimated: 28 mL/min — ABNORMAL LOW (ref >60)
Glucose, Bld: 133 mg/dL — ABNORMAL HIGH (ref 70–99)
Potassium: 3.7 mmol/L (ref 3.5–5.1)
Sodium: 143 mmol/L (ref 135–145)

## 2023-07-09 LAB — GLUCOSE, CAPILLARY
Glucose-Capillary: 106 mg/dL — ABNORMAL HIGH (ref 70–99)
Glucose-Capillary: 127 mg/dL — ABNORMAL HIGH (ref 70–99)

## 2023-07-09 LAB — MAGNESIUM: Magnesium: 2.9 mg/dL — ABNORMAL HIGH (ref 1.7–2.4)

## 2023-07-09 MED ORDER — IPRATROPIUM-ALBUTEROL 0.5-2.5 (3) MG/3ML IN SOLN: 3.0000 mL | Freq: Four times a day (QID) | RESPIRATORY_TRACT | 0 refills | Status: DC | PRN

## 2023-07-09 MED ORDER — PREDNISONE 20 MG PO TABS: 40.0000 mg | ORAL_TABLET | Freq: Every day | ORAL | 0 refills | Status: AC

## 2023-07-09 MED ORDER — SYMBICORT 160-4.5 MCG/ACT IN AERO: 2.0000 | INHALATION_SPRAY | Freq: Two times a day (BID) | RESPIRATORY_TRACT | 0 refills | Status: DC

## 2023-07-09 MED ORDER — PANTOPRAZOLE SODIUM 40 MG PO TBEC: 40.0000 mg | DELAYED_RELEASE_TABLET | Freq: Every day | ORAL | Status: AC

## 2023-07-09 MED ORDER — POLYETHYLENE GLYCOL 3350 17 G PO PACK: 17.0000 g | PACK | Freq: Every day | ORAL | Status: AC | PRN

## 2023-07-09 MED ORDER — GUAIFENESIN-DM 100-10 MG/5ML PO SYRP: 10.0000 mL | ORAL_SOLUTION | ORAL | 0 refills | Status: DC | PRN

## 2023-07-09 MED ORDER — PREDNISONE 20 MG PO TABS: 40.0000 mg | ORAL_TABLET | Freq: Every day | ORAL | 0 refills | Status: DC

## 2023-07-09 MED ORDER — FERROUS SULFATE 325 (65 FE) MG PO TABS: 325.0000 mg | ORAL_TABLET | Freq: Two times a day (BID) | ORAL | Status: DC

## 2023-07-09 MED ORDER — ATORVASTATIN CALCIUM 80 MG PO TABS: 40.0000 mg | ORAL_TABLET | Freq: Every day | ORAL | Status: DC

## 2023-07-09 MED ORDER — BUDESONIDE-FORMOTEROL FUMARATE 160-4.5 MCG/ACT IN AERO: 2.0000 | INHALATION_SPRAY | Freq: Two times a day (BID) | RESPIRATORY_TRACT | 0 refills | Status: AC

## 2023-07-09 NOTE — Progress Notes (Signed)
Patient ID: Sherry Fitzpatrick, female   DOB: November 14, 1950, 72 y.o.   MRN: [id number] S:C/o abdominal pain this morning. O:BP (!) 123/48 (BP Location: Right Arm)   Pulse 67   Temp 98.3 F (36.8 C) (Oral)   Resp 18   Ht 5\' 4"  (1.626 m)   Wt 110.7 kg   SpO2 (!) 75%   BMI 41.89 kg/m   Intake/Output Summary (Last 24 hours) at 07/09/2023 1214 Last data filed at 07/09/2023 0900 Gross per 24 hour  Intake 360 ml  Output 1100 ml  Net -740 ml   Intake/Output: I/O last 3 completed shifts: In: 480 [P.O.:480] Out: 1800 [Urine:1800]  Intake/Output this shift:  Total I/O In: 120 [P.O.:120] Out: -  Weight change: 1.4 kg Gen: NAD CVS: RRR Resp:CTA Abd: +BS, soft, NT/ND Ext: no edema  Recent Labs  Lab 07/03/23 0926 07/04/23 0437 07/04/23 1716 07/05/23 0943 07/06/23 0512 07/07/23 1639 07/08/23 0543 07/09/23 0354  NA 138 135 136 134* 138 140 140 143  K 4.5 4.9 5.1 4.7 4.9 4.4 3.6 3.7  CL 100 100 101 100 95* 100 98 101  CO2 25 26 23 25 28 31  32 32  GLUCOSE 162* 165* 112* 203* 121* 156* 115* 133*  BUN 60* 77* 90* 96* 103* 80* 74* 64*  CREATININE 3.96* 4.65* 4.96* 4.09* 3.30* 2.32* 2.06* 1.88*  ALBUMIN 3.1*  --   --   --   --   --  2.9*  --   CALCIUM 8.5* 8.0* 7.8* 7.6* 8.5* 8.4* 8.5* 8.9  PHOS  --   --   --   --   --   --  3.4  --   AST 16  --   --   --   --   --   --   --   ALT 17  --   --   --   --   --   --   --    Liver Function Tests: Recent Labs  Lab 07/03/23 0926 07/08/23 0543  AST 16  --   ALT 17  --   ALKPHOS 65  --   BILITOT 0.2*  --   PROT 7.3  --   ALBUMIN 3.1* 2.9*   No results for input(s): "LIPASE", "AMYLASE" in the last 168 hours. No results for input(s): "AMMONIA" in the last 168 hours. CBC: Recent Labs  Lab 07/03/23 0926 07/05/23 0943 07/06/23 0512  WBC 19.6* 9.8 9.5  NEUTROABS 17.5* 8.3* 6.6  HGB 11.2* 11.5* 12.6  HCT 35.6* 38.4 39.6  MCV 93.2 95.0 91.0  PLT 337 342 325   Cardiac Enzymes: No results for input(s): "CKTOTAL", "CKMB", "CKMBINDEX",  "TROPONINI" in the last 168 hours. CBG: Recent Labs  Lab 07/08/23 0724 07/08/23 1115 07/08/23 1706 07/08/23 2142 07/09/23 0711  GLUCAP 109* 138* 210* 132* 106*    Iron Studies: No results for input(s): "IRON", "TIBC", "TRANSFERRIN", "FERRITIN" in the last 72 hours. Studies/Results: No results found.  amiodarone  100 mg Oral Daily   amLODipine  10 mg Oral Daily   aspirin  81 mg Oral Daily   atorvastatin  40 mg Oral Daily   carvedilol  6.25 mg Oral BID WC   clopidogrel  75 mg Oral Daily   ferrous sulfate  325 mg Oral BID WC   heparin  5,000 Units Subcutaneous Q8H   hydrALAZINE  25 mg Oral Q8H   insulin aspart  0-5 Units Subcutaneous QHS   insulin aspart  0-6 Units Subcutaneous  TID WC   isosorbide mononitrate  120 mg Oral Daily   mometasone-formoterol  2 puff Inhalation BID   pantoprazole  40 mg Oral Daily   predniSONE  40 mg Oral Q breakfast   sodium chloride flush  3 mL Intravenous Q12H    BMET    Component Value Date/Time   NA 143 07/09/2023 0354   K 3.7 07/09/2023 0354   CL 101 07/09/2023 0354   CO2 32 07/09/2023 0354   GLUCOSE 133 (H) 07/09/2023 0354   BUN 64 (H) 07/09/2023 0354   CREATININE 1.88 (H) 07/09/2023 0354   CALCIUM 8.9 07/09/2023 0354   GFRNONAA 28 (L) 07/09/2023 0354   GFRAA 60 (L) 06/09/2020 0729   CBC    Component Value Date/Time   WBC 9.5 07/06/2023 0512   RBC 4.35 07/06/2023 0512   HGB 12.6 07/06/2023 0512   HCT 39.6 07/06/2023 0512   PLT 325 07/06/2023 0512   MCV 91.0 07/06/2023 0512   MCH 29.0 07/06/2023 0512   MCHC 31.8 07/06/2023 0512   RDW 16.2 (H) 07/06/2023 0512   LYMPHSABS 1.6 07/06/2023 0512   MONOABS 1.2 (H) 07/06/2023 0512   EOSABS 0.0 07/06/2023 0512   BASOSABS 0.0 07/06/2023 0512     Assessment/Plan:   AKI/CKD stage IIIb - (baseline Scr 1.2-1.6) likely multifactorial in setting of low BP's further complicated by acute on chronic diastolic CHF (cardiorenal syndrome) with concomitant ARB therapy.  ARB held and Scr has  slowly improved since admission.  Peak Scr 4.96 now down to 1.88.  UA with microscopic hematuria.  ANA negative, normal complements, ANCA pending.  Responding to Lasix 40 mg bid.  Continue with current regimen for now.  Stable for discharge from a renal standpoint but will need to follow up with Dr. Thedore Mins in our office after discharge.   Acute on chronic diastolic CHF - grade III diastolic dysfunction. UOP 2300 overnight.  Lasix held yesterday.  Continue to follow for now.  Acute hypoxic respiratory failure - due to copd exacerbation.  Viral panel negative.  Solumedrol per primary and completed course of Zithromax.   H/o CAD - stable.  HTN - Bp's improved and hypertensive.  Continue to hold avapro for now DM2 - per primary Paroxysmal atrial fib- not on anticoagulation.  Continue coreg and amiodarone. Disposition - for discharge today. Signing off.  Please call with questions or concerns.  Irena Cords, MD [facility]

## 2023-07-09 NOTE — Progress Notes (Signed)
Notified MD pt BP 180s/70s. Headache and nausea treated.

## 2023-07-09 NOTE — Progress Notes (Signed)
SATURATION QUALIFICATIONS: (This note is used to comply with regulatory documentation for home oxygen)  Patient Saturations on Room Air at Rest = 82%  Patient Saturations on Room Air while Ambulating = 88%  Patient Saturations on 2 Liters of oxygen while Ambulating = 94%  Please briefly explain why patient needs home oxygen:  Pt. Becomes short of breath at rest and while ambulating on R/A.  Margarita Grizzle

## 2023-07-09 NOTE — TOC Transition Note (Signed)
Transition of Care [facility]) - CM/SW Discharge Note   Patient Details  Name: Sherry Fitzpatrick MRN: [id number] Date of Birth: 1951-01-24  [facility]) CM/SW Contact:  Tom-Johnson, Hershal Coria, RN Phone Number: 07/09/2023, 12:57 PM   Clinical Narrative:     Patient is scheduled for discharge today.  Readmission Risk Assessment done. Home Health Resumption of Care info, Outpatient f/u, hospital f/u and discharge instructions on AVS. Nebulizer Machine and Home O2 delivered to patient at bedside by [employer]. Daughter, Sherry Fitzpatrick to transport at discharge.  No further TOC needs noted.           Final next level of care: Home w Home Health Services Barriers to Discharge: Barriers Resolved   Patient Goals and CMS Choice CMS Medicare.gov Compare Post Acute Care list provided to:: Patient Choice offered to / list presented to : Patient  Discharge Placement                  Patient to be transferred to facility by: Daughter Name of family member notified: Sherry Fitzpatrick    Discharge Plan and Services Additional resources added to the After Visit Summary for                  DME Arranged: Nebulizer machine, Oxygen DME Agency: [employer] Date DME Agency Contacted: 07/09/23 Time DME Agency Contacted: 1022 Representative spoke with at DME Agency: Earna Coder HH Arranged: PT, OT HH Agency: [facility] Date [facility] Agency Contacted: 07/09/23 Time HH Agency Contacted: 1100 Representative spoke with at [facility] Agency: Tresa Endo  Social Determinants of Health (SDOH) Interventions SDOH Screenings   Food Insecurity: No Food Insecurity (07/02/2023)  Housing: Low Risk  (07/02/2023)  Transportation Needs: No Transportation Needs (07/02/2023)  Utilities: Not At Risk (07/02/2023)  Tobacco Use: Low Risk  (07/01/2023)     Readmission Risk Interventions    07/03/2023   11:10 AM 12/18/2022    2:36 PM  Readmission Risk Prevention Plan  Transportation Screening Complete  Complete  PCP or Specialist Appt within 5-7 Days  Complete  PCP or Specialist Appt within 3-5 Days Complete   Home Care Screening  Complete  Medication Review (RN CM)  Complete  HRI or Home Care Consult Complete   Social Work Consult for Recovery Care Planning/Counseling Complete   Palliative Care Screening Not Applicable   Medication Review Oceanographer) Referral to Pharmacy

## 2023-07-09 NOTE — Progress Notes (Signed)
Pt DC home with HH. Discharge instructions including medication mgmt reviewed with pt and daughter Loman Brooklyn @ bedside. Verbalized understanding. IV removed w catheter intact. Tele DC. All belongings returned. DME Home O2 and Nebulizer machine delivered to pt @ bedside. Pt denies pain, no other complaints. Pt wheeled down to main entrance where Synetta Fail is waiting w personal vehicle to transport pt home.  All questions and concerns answered.

## 2023-07-09 NOTE — Discharge Summary (Addendum)
Physician Discharge Summary  Sherry Fitzpatrick ZOX:[id number] DOB: 07-09-1951 DOA: 07/01/2023  PCP: Shon Hale, MD  Admit date: 07/01/2023 Discharge date: 07/09/2023  Admitted From: Home Disposition: Home  Recommendations for Outpatient Follow-up:  Follow up with PCP in 1 week with repeat CBC/BMP Outpatient follow-up with nephrology Follow up in ED if symptoms worsen or new appear   Home Health: Home health PT/OT Equipment/Devices: Supplemental oxygen via nasal cannula at 2 L/min for saturations dropping in the 80s on room air. Discharge Condition: Stable CODE STATUS: Full Diet recommendation: Heart healthy/carb modified/fluid restriction of up to 1500 cc a day.  Brief/Interim Summary:  72 y.o. female with medical history significant of COPD, diabetes type 2, history of CAD, chronic diastolic heart failure, restrictive cardiomyopathy, CVA, atrial fibrillation not on anticoagulation, obstructive sleep apnea, GERD,  chronic anemia, obesity, hyperlipidemia and CKD stage 3B presented with worsening shortness of breath.  On presentation, she was saturating 75% on room air along with wheezing.  Chest x-ray showed central vascular congestion without significant edema.  COVID-19/influenza/RSV PCR were negative.  She was started on IV Solu-Medrol.  During the hospitalization, her renal function worsened.  She was started on IV Lasix.  Nephrology was consulted.  During the hospitalization, her condition has improved.  She has been switched to oral prednisone.  IV Lasix has been stopped by nephrology.  Kidney function has much improved.  Nephrology has cleared the patient for discharge.  Discharge patient home today with outpatient follow-up with PCP and nephrology.  Discharge Diagnoses:   COPD exacerbation Acute respiratory failure with hypoxia -Chest x-ray as above.  COVID-19/influenza/RSV PCR negative on admission. -Solu-Medrol has already been switched to prednisone 40 mg daily.  Has  completed a course of Zithromax. -Respiratory status improving.   -Continue prednisone 40 mg daily for 5 more days on discharge.  Use inhaler/nebulizer as needed.  Continue Symbicort.  Recommend outpatient evaluation and follow-up with pulmonary. -Will need supplemental oxygen via nasal cannula at 2 L/min for saturations dropping in the 80s on room air.   Acute on chronic diastolic heart failure/restrictive cardiomyopathy Hypertension -Echo on 12/16/2022 EF of 65 to 70% with grade 3 diastolic dysfunction.  Strict input and output.  Daily weights.  Fluid restriction.   -Has diuresed well.  Lasix held on 07/07/2023 by nephrology.   -Outpatient follow-up with cardiology -Blood pressure currently on the high side.  Continue Imdur, amlodipine, Coreg, and hydralazine.  -Neurology recommended to resume Lasix 40 mg p.o. twice daily and has cleared the patient for discharge.  Outpatient follow-up with nephrology.   AKI on CKD stage IIIb -Baseline creatinine of 1.2-1.6.  Monitor.  -Creatinine worsened to 4.96 on 07/04/2023.  Renal ultrasound negative for hydronephrosis.  IV fluids started on 07/04/2023 but subsequently discontinued and patient subsequently treated with scheduled IV Lasix.  IV Lasix has already been discontinued by nephrology. -Creatinine 1.88 today.  Nephrology following.  Diuretics plan as above.  History of coronary artery disease Hyperlipidemia -Stable.  Continue aspirin, Lipitor.  Continue Coreg.   Paroxysmal A-fib -Not on anticoagulation as an outpatient.  Continue amiodarone and Coreg.  Outpatient follow-up with cardiology   Diabetes mellitus type 2 with hyperglycemia -Carb modified diet.  Outpatient follow-up.   Anemia of chronic disease -Hemoglobin stable.  Monitor intermittently.   Leukocytosis -Resolved   History of obstructive sleep apnea -Stable.  Outpatient follow-up   Morbid obesity -Outpatient follow-up   Physical deconditioning -PT recommending home with  PT.    Discharge Instructions  Discharge  Instructions     Ambulatory referral to Pulmonology   Complete by: As directed    Reason for referral: Asthma/COPD   Diet - low sodium heart healthy   Complete by: As directed    Diet Carb Modified   Complete by: As directed    Increase activity slowly   Complete by: As directed       Allergies as of 07/09/2023       Reactions   Morphine And Codeine Hives, Nausea And Vomiting   Penicillins Hives   Has patient had a PCN reaction causing immediate rash, facial/tongue/throat swelling, SOB or lightheadedness with hypotension: Yes Has patient had a PCN reaction causing severe rash involving mucus membranes or skin necrosis: No Has patient had a PCN reaction that required hospitalization: No Has patient had a PCN reaction occurring within the last 10 years: No If all of the above answers are "NO", then may proceed with Cephalosporin use.   Percocet [oxycodone-acetaminophen] Nausea And Vomiting   Ace Inhibitors Nausea And Vomiting, Cough   Iodinated Contrast Media Nausea And Vomiting      Iodine Itching   Etodolac Nausea And Vomiting   Tramadol Nausea And Vomiting   Vicodin [hydrocodone-acetaminophen] Nausea And Vomiting        Medication List     STOP taking these medications    docusate sodium 100 MG capsule Commonly known as: COLACE   valsartan 320 MG tablet Commonly known as: DIOVAN       TAKE these medications    acetaminophen 325 MG tablet Commonly known as: TYLENOL Take 2 tablets (650 mg total) by mouth every 6 (six) hours as needed (Mild pain). What changed:  when to take this reasons to take this   albuterol 108 (90 Base) MCG/ACT inhaler Commonly known as: VENTOLIN HFA Inhale 2 puffs into the lungs every 6 (six) hours as needed for wheezing.   amiodarone 200 MG tablet Commonly known as: PACERONE Take 100 mg by mouth daily.   amLODipine 10 MG tablet Commonly known as: NORVASC Take 1 tablet (10 mg total)  by mouth daily. What changed: how much to take   aspirin 81 MG chewable tablet Chew 81 mg by mouth daily.   atorvastatin 80 MG tablet Commonly known as: LIPITOR Take 0.5 tablets (40 mg total) by mouth daily.   carvedilol 6.25 MG tablet Commonly known as: COREG Take 1 tablet (6.25 mg total) by mouth 2 (two) times daily.   clopidogrel 75 MG tablet Commonly known as: PLAVIX Take 1 tablet (75 mg total) by mouth daily.   ferrous sulfate 325 (65 FE) MG tablet Take 1 tablet (325 mg total) by mouth 2 (two) times daily with a meal.   furosemide 40 MG tablet Commonly known as: LASIX Take 1 tablet (40 mg total) by mouth daily at 2 PM.   guaiFENesin-dextromethorphan 100-10 MG/5ML syrup Commonly known as: ROBITUSSIN DM Take 10 mLs by mouth every 4 (four) hours as needed for cough.   hydrALAZINE 25 MG tablet Commonly known as: APRESOLINE Take 1 tablet (25 mg total) by mouth every 8 (eight) hours.   ipratropium-albuterol 0.5-2.5 (3) MG/3ML Soln Commonly known as: DUONEB Take 3 mLs by nebulization every 6 (six) hours as needed (wheezing/shortness of breath).   isosorbide mononitrate 120 MG 24 hr tablet Commonly known as: IMDUR Take 120 mg by mouth daily.   nitroGLYCERIN 0.4 MG SL tablet Commonly known as: NITROSTAT Place 1 tablet (0.4 mg total) under the tongue every 5 (five) minutes x  3 doses as needed for chest pain.   pantoprazole 40 MG tablet Commonly known as: PROTONIX Take 1 tablet (40 mg total) by mouth daily.   polyethylene glycol 17 g packet Commonly known as: MIRALAX / GLYCOLAX Take 17 g by mouth daily as needed for moderate constipation.   potassium chloride SA 20 MEQ tablet Commonly known as: KLOR-CON M Take 1 tablet (20 mEq total) by mouth daily.   predniSONE 20 MG tablet Commonly known as: DELTASONE Take 2 tablets (40 mg total) by mouth daily with breakfast for 5 days. Start taking on: July 10, 2023   Symbicort 160-4.5 MCG/ACT inhaler Generic drug:  budesonide-formoterol Inhale 2 puffs into the lungs 2 (two) times daily. What changed:  when to take this reasons to take this               Durable Medical Equipment  (From admission, onward)           Start     Ordered   07/05/23 1405  For home use only DME Nebulizer machine  Once       Question Answer Comment  Patient needs a nebulizer to treat with the following condition COPD exacerbation (HCC)   Length of Need 12 Months      07/05/23 1404            Follow-up Information     Shon Hale, MD. Schedule an appointment as soon as possible for a visit in 1 week(s).   Specialty: Family Medicine Why: with repeat cbc/bmp Contact information: [address] [place] [address] [phone]         Rinaldo Cloud, MD. Schedule an appointment as soon as possible for a visit in 1 week(s).   Specialty: Cardiology Contact information: [address] [place] [address] [phone]                Allergies  Allergen Reactions   Morphine And Codeine Hives and Nausea And Vomiting   Penicillins Hives    Has patient had a PCN reaction causing immediate rash, facial/tongue/throat swelling, SOB or lightheadedness with hypotension: Yes Has patient had a PCN reaction causing severe rash involving mucus membranes or skin necrosis: No Has patient had a PCN reaction that required hospitalization: No Has patient had a PCN reaction occurring within the last 10 years: No If all of the above answers are "NO", then may proceed with Cephalosporin use.   Percocet [Oxycodone-Acetaminophen] Nausea And Vomiting   Ace Inhibitors Nausea And Vomiting and Cough   Iodinated Contrast Media Nausea And Vomiting        Iodine Itching   Etodolac Nausea And Vomiting   Tramadol Nausea And Vomiting   Vicodin [Hydrocodone-Acetaminophen] Nausea And Vomiting    Consultations: Nephrology   Procedures/Studies: DG CHEST PORT 1  VIEW  Result Date: 07/05/2023 CLINICAL DATA:  Dyspnea, cough. EXAM: PORTABLE CHEST 1 VIEW COMPARISON:  Radiograph 07/01/2023. FINDINGS: Stable cardiomegaly. Unchanged mediastinal contours. Aortic atherosclerosis. Slight worsening pulmonary vascular congestion. No confluent consolidation. No significant pleural effusion. No pneumothorax. IMPRESSION: Slight worsening pulmonary vascular congestion. Stable cardiomegaly. Electronically Signed   By: Narda Rutherford M.D.   On: 07/05/2023 09:24   US RENAL  Result Date: 07/04/2023 CLINICAL DATA:  AKI EXAM: RENAL / URINARY TRACT ULTRASOUND COMPLETE COMPARISON:  Renal ultrasound 12/18/2022 FINDINGS: Right Kidney: Renal measurements: 10.3 cm x 4.0 cm x 5.0 cm = volume: 107 mL. Echogenicity within normal limits. No mass or hydronephrosis visualized. Left Kidney:  Renal measurements: 11.0 cm x 5.1 cm x 4.5 cm = volume: 132 mL. Parenchymal echogenicity is normal. There is 2.5 cm cyst requiring no specific imaging follow-up. There is no hydronephrosis. Bladder: Not identified. Other: None. IMPRESSION: Left renal cyst requiring no specific imaging follow-up. Otherwise, unremarkable renal ultrasound. Electronically Signed   By: Lesia Hausen M.D.   On: 07/04/2023 13:24   DG Chest 2 View  Result Date: 07/01/2023 CLINICAL DATA:  COPD EXAM: CHEST - 2 VIEW COMPARISON:  12/24/2022 FINDINGS: Cardiac shadow is enlarged but stable. Aortic calcifications are noted. The lungs well aerated bilaterally. Central vascular congestion is noted without significant edema. No bony abnormality is noted. IMPRESSION: Central vascular congestion without significant edema. Electronically Signed   By: Alcide Clever M.D.   On: 07/01/2023 19:53      Subjective: Patient seen and examined at bedside.  Feels okay to go home today.  No fever or vomiting reported.  Still slightly short of breath with exertion.  Discharge Exam: Vitals:   07/09/23 0840 07/09/23 0928  BP: (!) 148/58 120/68  Pulse:  64 75  Resp:  18  Temp: 97.7 F (36.5 C) 98.2 F (36.8 C)  SpO2: 97% 99%    General: Pt is alert, awake, not in acute distress.  Elderly female lying in bed. Cardiovascular: rate controlled, S1/S2 + Respiratory: bilateral decreased breath sounds at bases with scattered crackles Abdominal: Soft, morbidly obese, NT, ND, bowel sounds + Extremities: Trace lower extremity edema; no cyanosis    The results of significant diagnostics from this hospitalization (including imaging, microbiology, ancillary and laboratory) are listed below for reference.     Microbiology: Recent Results (from the past 240 hour(s))  Resp panel by RT-PCR (RSV, Flu A&B, Covid) Anterior Nasal Swab     Status: None   Collection Time: 07/01/23  7:43 PM   Specimen: Anterior Nasal Swab  Result Value Ref Range Status   SARS Coronavirus 2 by RT PCR NEGATIVE NEGATIVE Final   Influenza A by PCR NEGATIVE NEGATIVE Final   Influenza B by PCR NEGATIVE NEGATIVE Final    Comment: (NOTE) The Xpert Xpress SARS-CoV-2/FLU/RSV plus assay is intended as an aid in the diagnosis of influenza from Nasopharyngeal swab specimens and should not be used as a sole basis for treatment. Nasal washings and aspirates are unacceptable for Xpert Xpress SARS-CoV-2/FLU/RSV testing.  Fact Sheet for Patients: BloggerCourse.com  Fact Sheet for Healthcare Providers: SeriousBroker.it  This test is not yet approved or cleared by the [place] FDA and has been authorized for detection and/or diagnosis of SARS-CoV-2 by FDA under an Emergency Use Authorization (EUA). This EUA will remain in effect (meaning this test can be used) for the duration of the COVID-19 declaration under Section 564(b)(1) of the Act, 21 U.S.C. section 360bbb-3(b)(1), unless the authorization is terminated or revoked.     Resp Syncytial Virus by PCR NEGATIVE NEGATIVE Final    Comment: (NOTE) Fact Sheet for  Patients: BloggerCourse.com  Fact Sheet for Healthcare Providers: SeriousBroker.it  This test is not yet approved or cleared by the [place] FDA and has been authorized for detection and/or diagnosis of SARS-CoV-2 by FDA under an Emergency Use Authorization (EUA). This EUA will remain in effect (meaning this test can be used) for the duration of the COVID-19 declaration under Section 564(b)(1) of the Act, 21 U.S.C. section 360bbb-3(b)(1), unless the authorization is terminated or revoked.  Performed at [facility], [address] [place], [place] [address]   Culture,  blood (Routine X 2) w Reflex to ID Panel     Status: None   Collection Time: 07/01/23 11:43 PM   Specimen: BLOOD  Result Value Ref Range Status   Specimen Description BLOOD LEFT ANTECUBITAL  Final   Special Requests   Final    BOTTLES DRAWN AEROBIC AND ANAEROBIC Blood Culture adequate volume   Culture   Final    NO GROWTH 5 DAYS Performed at [facility], [address], [place], [place] [address]    Report Status 07/07/2023 FINAL  Final  Culture, blood (Routine X 2) w Reflex to ID Panel     Status: None   Collection Time: 07/01/23 11:48 PM   Specimen: BLOOD RIGHT HAND  Result Value Ref Range Status   Specimen Description BLOOD RIGHT HAND  Final   Special Requests   Final    BOTTLES DRAWN AEROBIC AND ANAEROBIC Blood Culture adequate volume   Culture   Final    NO GROWTH 5 DAYS Performed at [facility], [address], [place], [place] [address]    Report Status 07/07/2023 FINAL  Final     Labs: BNP (last 3 results) Recent Labs    12/20/22 1105 07/01/23 1834 07/05/23 0943  BNP 232.8* 170.6* 147.1*   Basic Metabolic Panel: Recent Labs  Lab 07/03/23 0926 07/04/23 0437 07/05/23 0943 07/06/23 0512 07/07/23 1639 07/08/23 0543 07/09/23 0354  NA 138   < > 134* 138 140 140 143  K 4.5   < > 4.7 4.9 4.4 3.6 3.7  CL 100   < >  100 95* 100 98 101  CO2 25   < > 25 28 31  32 32  GLUCOSE 162*   < > 203* 121* 156* 115* 133*  BUN 60*   < > 96* 103* 80* 74* 64*  CREATININE 3.96*   < > 4.09* 3.30* 2.32* 2.06* 1.88*  CALCIUM 8.5*   < > 7.6* 8.5* 8.4* 8.5* 8.9  MG 2.7*  --  3.0*  --  2.9*  --  2.9*  PHOS  --   --   --   --   --  3.4  --    < > = values in this interval not displayed.   Liver Function Tests: Recent Labs  Lab 07/03/23 0926 07/08/23 0543  AST 16  --   ALT 17  --   ALKPHOS 65  --   BILITOT 0.2*  --   PROT 7.3  --   ALBUMIN 3.1* 2.9*   No results for input(s): "LIPASE", "AMYLASE" in the last 168 hours. No results for input(s): "AMMONIA" in the last 168 hours. CBC: Recent Labs  Lab 07/03/23 0926 07/05/23 0943 07/06/23 0512  WBC 19.6* 9.8 9.5  NEUTROABS 17.5* 8.3* 6.6  HGB 11.2* 11.5* 12.6  HCT 35.6* 38.4 39.6  MCV 93.2 95.0 91.0  PLT 337 342 325   Cardiac Enzymes: No results for input(s): "CKTOTAL", "CKMB", "CKMBINDEX", "TROPONINI" in the last 168 hours. BNP: Invalid input(s): "POCBNP" CBG: Recent Labs  Lab 07/08/23 0724 07/08/23 1115 07/08/23 1706 07/08/23 2142 07/09/23 0711  GLUCAP 109* 138* 210* 132* 106*   D-Dimer No results for input(s): "DDIMER" in the last 72 hours. Hgb A1c No results for input(s): "HGBA1C" in the last 72 hours. Lipid Profile No results for input(s): "CHOL", "HDL", "LDLCALC", "TRIG", "CHOLHDL", "LDLDIRECT" in the last 72 hours. Thyroid function studies No results for input(s): "TSH", "T4TOTAL", "T3FREE", "THYROIDAB" in the last 72 hours.  Invalid  input(s): "FREET3" Anemia work up No results for input(s): "VITAMINB12", "FOLATE", "FERRITIN", "TIBC", "IRON", "RETICCTPCT" in the last 72 hours. Urinalysis    Component Value Date/Time   COLORURINE STRAW (A) 07/05/2023 1212   APPEARANCEUR CLOUDY (A) 07/05/2023 1212   LABSPEC 1.008 07/05/2023 1212   PHURINE 5.0 07/05/2023 1212   GLUCOSEU NEGATIVE 07/05/2023 1212   HGBUR NEGATIVE 07/05/2023 1212    BILIRUBINUR NEGATIVE 07/05/2023 1212   KETONESUR NEGATIVE 07/05/2023 1212   PROTEINUR NEGATIVE 07/05/2023 1212   UROBILINOGEN 0.2 04/14/2015 1451   NITRITE NEGATIVE 07/05/2023 1212   LEUKOCYTESUR SMALL (A) 07/05/2023 1212   Sepsis Labs Recent Labs  Lab 07/03/23 0926 07/05/23 0943 07/06/23 0512  WBC 19.6* 9.8 9.5   Microbiology Recent Results (from the past 240 hour(s))  Resp panel by RT-PCR (RSV, Flu A&B, Covid) Anterior Nasal Swab     Status: None   Collection Time: 07/01/23  7:43 PM   Specimen: Anterior Nasal Swab  Result Value Ref Range Status   SARS Coronavirus 2 by RT PCR NEGATIVE NEGATIVE Final   Influenza A by PCR NEGATIVE NEGATIVE Final   Influenza B by PCR NEGATIVE NEGATIVE Final    Comment: (NOTE) The Xpert Xpress SARS-CoV-2/FLU/RSV plus assay is intended as an aid in the diagnosis of influenza from Nasopharyngeal swab specimens and should not be used as a sole basis for treatment. Nasal washings and aspirates are unacceptable for Xpert Xpress SARS-CoV-2/FLU/RSV testing.  Fact Sheet for Patients: BloggerCourse.com  Fact Sheet for Healthcare Providers: SeriousBroker.it  This test is not yet approved or cleared by the [place] FDA and has been authorized for detection and/or diagnosis of SARS-CoV-2 by FDA under an Emergency Use Authorization (EUA). This EUA will remain in effect (meaning this test can be used) for the duration of the COVID-19 declaration under Section 564(b)(1) of the Act, 21 U.S.C. section 360bbb-3(b)(1), unless the authorization is terminated or revoked.     Resp Syncytial Virus by PCR NEGATIVE NEGATIVE Final    Comment: (NOTE) Fact Sheet for Patients: BloggerCourse.com  Fact Sheet for Healthcare Providers: SeriousBroker.it  This test is not yet approved or cleared by the [place] FDA and has been authorized for detection and/or  diagnosis of SARS-CoV-2 by FDA under an Emergency Use Authorization (EUA). This EUA will remain in effect (meaning this test can be used) for the duration of the COVID-19 declaration under Section 564(b)(1) of the Act, 21 U.S.C. section 360bbb-3(b)(1), unless the authorization is terminated or revoked.  Performed at [facility], [address], [place], [place] [address]   Culture, blood (Routine X 2) w Reflex to ID Panel     Status: None   Collection Time: 07/01/23 11:43 PM   Specimen: BLOOD  Result Value Ref Range Status   Specimen Description BLOOD LEFT ANTECUBITAL  Final   Special Requests   Final    BOTTLES DRAWN AEROBIC AND ANAEROBIC Blood Culture adequate volume   Culture   Final    NO GROWTH 5 DAYS Performed at [facility], [address] [place], [place] [address]    Report Status 07/07/2023 FINAL  Final  Culture, blood (Routine X 2) w Reflex to ID Panel     Status: None   Collection Time: 07/01/23 11:48 PM   Specimen: BLOOD RIGHT HAND  Result Value Ref Range Status   Specimen Description BLOOD RIGHT HAND  Final   Special Requests   Final    BOTTLES DRAWN AEROBIC AND ANAEROBIC Blood Culture adequate  volume   Culture   Final    NO GROWTH 5 DAYS Performed at [facility], [address] [place], [place] [address]    Report Status 07/07/2023 FINAL  Final     Time coordinating discharge: 35 minutes  SIGNED:   Glade Lloyd, MD  [facility] 07/09/2023, 9:58 AM

## 2023-07-09 NOTE — Progress Notes (Signed)
O2 sat 70% on R/A, pt. C/o some substernal chest and abdominal discomfort with inspiration, 2 L N/C applied and O2 sat 95%, pt. States chest discomfort is improving, Dr. Hanley Ben aware of all of the above, will order home O2  Margarita Grizzle

## 2023-07-11 DIAGNOSIS — J9601 Acute respiratory failure with hypoxia: Secondary | ICD-10-CM | POA: Diagnosis not present

## 2023-07-11 DIAGNOSIS — J441 Chronic obstructive pulmonary disease with (acute) exacerbation: Secondary | ICD-10-CM | POA: Diagnosis not present

## 2023-07-18 DIAGNOSIS — Z79899 Other long term (current) drug therapy: Secondary | ICD-10-CM | POA: Diagnosis not present

## 2023-07-18 DIAGNOSIS — E1165 Type 2 diabetes mellitus with hyperglycemia: Secondary | ICD-10-CM | POA: Diagnosis not present

## 2023-07-18 DIAGNOSIS — I5032 Chronic diastolic (congestive) heart failure: Secondary | ICD-10-CM | POA: Diagnosis not present

## 2023-07-18 DIAGNOSIS — I110 Hypertensive heart disease with heart failure: Secondary | ICD-10-CM | POA: Diagnosis not present

## 2023-07-23 LAB — GLUCOSE, CAPILLARY
Glucose-Capillary: 137 mg/dL — ABNORMAL HIGH (ref 70–99)
Glucose-Capillary: 178 mg/dL — ABNORMAL HIGH (ref 70–99)

## 2023-08-14 DIAGNOSIS — M1612 Unilateral primary osteoarthritis, left hip: Secondary | ICD-10-CM | POA: Diagnosis not present

## 2023-08-22 DIAGNOSIS — E1122 Type 2 diabetes mellitus with diabetic chronic kidney disease: Secondary | ICD-10-CM | POA: Diagnosis not present

## 2023-08-22 DIAGNOSIS — N1832 Chronic kidney disease, stage 3b: Secondary | ICD-10-CM | POA: Diagnosis not present

## 2023-08-22 DIAGNOSIS — I130 Hypertensive heart and chronic kidney disease with heart failure and stage 1 through stage 4 chronic kidney disease, or unspecified chronic kidney disease: Secondary | ICD-10-CM | POA: Diagnosis not present

## 2023-08-22 DIAGNOSIS — I10 Essential (primary) hypertension: Secondary | ICD-10-CM | POA: Diagnosis not present

## 2023-08-22 DIAGNOSIS — I5032 Chronic diastolic (congestive) heart failure: Secondary | ICD-10-CM | POA: Diagnosis not present

## 2023-08-27 DIAGNOSIS — R42 Dizziness and giddiness: Secondary | ICD-10-CM | POA: Diagnosis not present

## 2023-08-28 ENCOUNTER — Ambulatory Visit: Payer: 59 | Attending: Cardiology | Admitting: Cardiology

## 2023-08-28 ENCOUNTER — Encounter: Payer: Self-pay | Admitting: Cardiology

## 2023-08-28 VITALS — BP 130/80 | HR 64 | Ht 64.0 in | Wt 235.4 lb

## 2023-08-28 DIAGNOSIS — Z01812 Encounter for preprocedural laboratory examination: Secondary | ICD-10-CM

## 2023-08-28 DIAGNOSIS — E785 Hyperlipidemia, unspecified: Secondary | ICD-10-CM

## 2023-08-28 DIAGNOSIS — I480 Paroxysmal atrial fibrillation: Secondary | ICD-10-CM | POA: Diagnosis not present

## 2023-08-28 DIAGNOSIS — H2513 Age-related nuclear cataract, bilateral: Secondary | ICD-10-CM | POA: Diagnosis not present

## 2023-08-28 DIAGNOSIS — R0602 Shortness of breath: Secondary | ICD-10-CM

## 2023-08-28 DIAGNOSIS — I2510 Atherosclerotic heart disease of native coronary artery without angina pectoris: Secondary | ICD-10-CM

## 2023-08-28 DIAGNOSIS — I350 Nonrheumatic aortic (valve) stenosis: Secondary | ICD-10-CM

## 2023-08-28 DIAGNOSIS — H25043 Posterior subcapsular polar age-related cataract, bilateral: Secondary | ICD-10-CM | POA: Diagnosis not present

## 2023-08-28 DIAGNOSIS — H25013 Cortical age-related cataract, bilateral: Secondary | ICD-10-CM | POA: Diagnosis not present

## 2023-08-28 MED ORDER — PREDNISONE 50 MG PO TABS: ORAL_TABLET | ORAL | 0 refills | Status: DC

## 2023-08-28 NOTE — Patient Instructions (Signed)
Medication Instructions:  Your physician recommends that you continue on your current medications as directed. Please refer to the Current Medication list given to you today.  *If you need a refill on your cardiac medications before your next appointment, please call your pharmacy*   Lab Work: CMET, Mag, Lipids, CBC If you have labs (blood work) drawn today and your tests are completely normal, you will receive your results only by: MyChart Message (if you have MyChart) OR A paper copy in the mail If you have any lab test that is abnormal or we need to change your treatment, we will call you to review the results.   Testing/Procedures: Your physician has requested that you have an echocardiogram. Echocardiography is a painless test that uses sound waves to create images of your heart. It provides your doctor with information about the size and shape of your heart and how well your heart's chambers and valves are working. This procedure takes approximately one hour. There are no restrictions for this procedure. Please do NOT wear cologne, perfume, aftershave, or lotions (deodorant is allowed). Please arrive 15 minutes prior to your appointment time.  Please note: We ask at that you not bring children with you during ultrasound (echo/ vascular) testing. Due to room size and safety concerns, children are not allowed in the ultrasound rooms during exams. Our front office staff cannot provide observation of children in our lobby area while testing is being conducted. An adult accompanying a patient to their appointment will only be allowed in the ultrasound room at the discretion of the ultrasound technician under special circumstances. We apologize for any inconvenience.   You are scheduled for a Cardiac Catheterization on Monday, November 18 with Dr. Bryan Lemma.  1. Please arrive at the [facility] (Main Entrance A) at [facility]: [address] [place], [place] [address] at 7:00 AM  (This time is 2 hour(s) before your procedure to ensure your preparation). Free valet parking service is available. You will check in at ADMITTING. The support person will be asked to wait in the waiting room.  It is OK to have someone drop you off and come back when you are ready to be discharged.    Special note: Every effort is made to have your procedure done on time. Please understand that emergencies sometimes delay scheduled procedures.  2. Diet: Do not eat solid foods after midnight.  The patient may have clear liquids until 5am upon the day of the procedure.  3. Labs: You will need to have blood drawn on TODAY  4. Medication instructions in preparation for your procedure:   Contrast Allergy: Yes, Please take Prednisone 50mg  by mouth at: Thirteen hours prior to cath 6:00pm on Sunday Seven hours prior to cath 12:00am on Monday And prior to leaving home please take last dose of Prednisone 50mg  and Benadryl 50mg  by mouth.   Stop taking Lasix (Furosemide)  Monday, November 18   On the morning of your procedure, take your Aspirin 81 mg and any morning medicines NOT listed above.  You may use sips of water.  5. Plan to go home the same day, you will only stay overnight if medically necessary. 6. Bring a current list of your medications and current insurance cards. 7. You MUST have a responsible person to drive you home. 8. Someone MUST be with you the first 24 hours after you arrive home or your discharge will be delayed. 9. Please wear clothes that are easy to get on  and off and wear slip-on shoes.  Thank you for allowing [place] to care for you!   -- [facility]    Follow-Up: At [facility], you and your health needs are our priority.  As part of our continuing mission to provide you with exceptional heart care, we have created designated Provider Care Teams.  These Care Teams include your primary Cardiologist (physician) and Advanced  Practice Providers (APPs -  Physician Assistants and Nurse Practitioners) who all work together to provide you with the care you need, when you need it.   Your next appointment:   4 week(s)  Provider:   Thomasene Ripple, DO

## 2023-08-29 ENCOUNTER — Ambulatory Visit: Payer: 59

## 2023-08-29 ENCOUNTER — Telehealth: Payer: Self-pay | Admitting: Cardiology

## 2023-08-29 ENCOUNTER — Telehealth: Payer: Self-pay | Admitting: *Deleted

## 2023-08-29 LAB — COMPREHENSIVE METABOLIC PANEL
ALT: 13 [IU]/L (ref 0–32)
AST: 15 [IU]/L (ref 0–40)
Albumin: 3.9 g/dL (ref 3.8–4.8)
Alkaline Phosphatase: 82 [IU]/L (ref 44–121)
BUN/Creatinine Ratio: 14 (ref 12–28)
BUN: 18 mg/dL (ref 8–27)
Bilirubin Total: 0.2 mg/dL (ref 0.0–1.2)
CO2: 22 mmol/L (ref 20–29)
Calcium: 9.2 mg/dL (ref 8.7–10.3)
Chloride: 101 mmol/L (ref 96–106)
Creatinine, Ser: 1.29 mg/dL — ABNORMAL HIGH (ref 0.57–1.00)
Globulin, Total: 3.3 g/dL (ref 1.5–4.5)
Glucose: 142 mg/dL — ABNORMAL HIGH (ref 70–99)
Potassium: 4.2 mmol/L (ref 3.5–5.2)
Sodium: 139 mmol/L (ref 134–144)
Total Protein: 7.2 g/dL (ref 6.0–8.5)
eGFR: 44 mL/min/{1.73_m2} — ABNORMAL LOW (ref >59)

## 2023-08-29 LAB — CBC WITH DIFFERENTIAL/PLATELET
Basophils Absolute: 0.1 10*3/uL (ref 0.0–0.2)
Basos: 1 %
EOS (ABSOLUTE): 0.3 10*3/uL (ref 0.0–0.4)
Eos: 5 %
Hematocrit: 37.3 % (ref 34.0–46.6)
Hemoglobin: 12.1 g/dL (ref 11.1–15.9)
Immature Grans (Abs): 0 10*3/uL (ref 0.0–0.1)
Immature Granulocytes: 0 %
Lymphocytes Absolute: 2 10*3/uL (ref 0.7–3.1)
Lymphs: 33 %
MCH: 28.7 pg (ref 26.6–33.0)
MCHC: 32.4 g/dL (ref 31.5–35.7)
MCV: 88 fL (ref 79–97)
Monocytes Absolute: 0.6 10*3/uL (ref 0.1–0.9)
Monocytes: 10 %
Neutrophils Absolute: 2.9 10*3/uL (ref 1.4–7.0)
Neutrophils: 51 %
Platelets: 294 10*3/uL (ref 150–450)
RBC: 4.22 x10E6/uL (ref 3.77–5.28)
RDW: 15.5 % — ABNORMAL HIGH (ref 11.7–15.4)
WBC: 5.8 10*3/uL (ref 3.4–10.8)

## 2023-08-29 LAB — LIPID PANEL
Chol/HDL Ratio: 5.8 ratio — ABNORMAL HIGH (ref 0.0–4.4)
Cholesterol, Total: 197 mg/dL (ref 100–199)
HDL: 34 mg/dL — ABNORMAL LOW (ref >39)
LDL Chol Calc (NIH): 131 mg/dL — ABNORMAL HIGH (ref 0–99)
Triglycerides: 178 mg/dL — ABNORMAL HIGH (ref 0–149)
VLDL Cholesterol Cal: 32 mg/dL (ref 5–40)

## 2023-08-29 LAB — MAGNESIUM: Magnesium: 2.1 mg/dL (ref 1.6–2.3)

## 2023-08-29 MED ORDER — PREDNISONE 50 MG PO TABS: ORAL_TABLET | ORAL | 0 refills | Status: DC

## 2023-08-29 NOTE — Progress Notes (Signed)
Cardiology Office Note:    Date:  08/29/2023   ID:  Sherry Fitzpatrick, DOB 1951/09/08, MRN [id number]  PCP:  Shon Hale, MD  Cardiologist:  Thomasene Ripple, DO  Electrophysiologist:  None   Referring MD: Shon Hale, *   " I am really short of breath"   History of Present Illness:    Sherry Fitzpatrick is a 72 y.o. female with a hx of COPD, diabetes type 2, CAD s/p DES, moderate aortic stenosis, chronic diastolic heart failure, restrictive cardiomyopathy, CVA, atrial fibrillation not on anticoagulation- unclear why obstructive sleep apnea, GERD,  chronic anemia, obesity, hyperlipidemia and CKD stage 3B presented   She presents today with intermittent chest pain, shortness of breath. The chest pain, previously resolved, has returned and is experienced both during activity and at rest. She is concerned. In terms of the shortness of breath this is even occurring at rest. The patient's symptoms have been worsening, causing concern for her daughter who has noticed the patient's pallor and increased shortness of breath.    Past Medical History:  Diagnosis Date   Acute non-Q wave non-ST elevation myocardial infarction (NSTEMI) (HCC) 05/24/2020   Acute respiratory failure with hypoxia (HCC) 11/22/2019   AKI (acute kidney injury) (HCC) 07/03/2018   Alterations of sensations, late effect of cerebrovascular disease(438.6)    Arthritis    "all over"   Backache, unspecified    Body mass index 40.0-44.9, adult (HCC)    Carpal tunnel syndrome    Cerebral thrombosis with cerebral infarction 05/29/2020   Dizziness and giddiness    Esophageal reflux    Generalized pain    Gout attack 03/28/2020   Headache    "had alot til I had pituitary tumor removed"   Heart murmur    Hypertensive encephalopathy 03/28/2020   Mild cognitive impairment 07/16/2022   NSTEMI (non-ST elevated myocardial infarction) (HCC)    Other abnormal glucose    Other and unspecified hyperlipidemia    Other  malaise and fatigue    Other postprocedural status(V45.89)    Other symptoms involving cardiovascular system    Shock (HCC)    Sleep apnea    "did test; they wanted me to get a CPAP but I never did get it" (04/14/2015) Does use inhaler at night (01 2021)   Stroke (HCC) 06/2003; 02/2010   "minor; minor" ; denies residual on 04/14/2015   Unspecified disorder of the pituitary gland and its hypothalamic control    Unspecified essential hypertension    Unspecified visual disturbance     Past Surgical History:  Procedure Laterality Date   CARPAL TUNNEL RELEASE Left ~ 2014   CORONARY STENT INTERVENTION N/A 05/24/2020   Procedure: CORONARY STENT INTERVENTION;  Surgeon: Corky Crafts, MD;  Location: [facility];  Service: Cardiovascular;  Laterality: N/A;   JOINT REPLACEMENT     LEFT HEART CATH AND CORONARY ANGIOGRAPHY N/A 05/24/2020   Procedure: LEFT HEART CATH AND CORONARY ANGIOGRAPHY;  Surgeon: Rinaldo Cloud, MD;  Location: [facility] CV [facility];  Service: Cardiovascular;  Laterality: N/A;   TOTAL HIP ARTHROPLASTY Right 11/06/2010   TRANSPHENOIDAL / TRANSNASAL HYPOPHYSECTOMY / RESECTION PITUITARY TUMOR  06/13/2012   TUBAL LIGATION  1974    Current Medications: Current Meds  Medication Sig   acetaminophen (TYLENOL) 325 MG tablet Take 2 tablets (650 mg total) by mouth every 6 (six) hours as needed (Mild pain). (Patient taking differently: Take 650 mg by mouth as needed for mild pain (pain score 1-3) or  moderate pain (pain score 4-6).)   albuterol (VENTOLIN HFA) 108 (90 Base) MCG/ACT inhaler Inhale 2 puffs into the lungs every 6 (six) hours as needed for wheezing.   amiodarone (PACERONE) 200 MG tablet Take 100 mg by mouth in the morning.   aspirin 81 MG chewable tablet Chew 81 mg by mouth in the morning.   budesonide-formoterol (SYMBICORT) 160-4.5 MCG/ACT inhaler Inhale 2 puffs into the lungs 2 (two) times daily. (Patient taking differently: Inhale 2 puffs into the lungs in the morning.)    carvedilol (COREG) 6.25 MG tablet Take 1 tablet (6.25 mg total) by mouth 2 (two) times daily.   clopidogrel (PLAVIX) 75 MG tablet Take 1 tablet (75 mg total) by mouth daily.   furosemide (LASIX) 40 MG tablet Take 1 tablet (40 mg total) by mouth daily at 2 PM. (Patient taking differently: Take 40 mg by mouth in the morning.)   hydrALAZINE (APRESOLINE) 25 MG tablet Take 1 tablet (25 mg total) by mouth every 8 (eight) hours.   ipratropium-albuterol (DUONEB) 0.5-2.5 (3) MG/3ML SOLN Take 3 mLs by nebulization every 6 (six) hours as needed (wheezing/shortness of breath).   isosorbide mononitrate (IMDUR) 120 MG 24 hr tablet Take 120 mg by mouth in the morning.   nitroGLYCERIN (NITROSTAT) 0.4 MG SL tablet Place 1 tablet (0.4 mg total) under the tongue every 5 (five) minutes x 3 doses as needed for chest pain.   pantoprazole (PROTONIX) 40 MG tablet Take 1 tablet (40 mg total) by mouth daily.   polyethylene glycol (MIRALAX / GLYCOLAX) 17 g packet Take 17 g by mouth daily as needed for moderate constipation.   potassium chloride SA (KLOR-CON M) 20 MEQ tablet Take 1 tablet (20 mEq total) by mouth daily.   [DISCONTINUED] amLODipine (NORVASC) 10 MG tablet Take 1 tablet (10 mg total) by mouth daily. (Patient taking differently: Take 5 mg by mouth daily.)   [DISCONTINUED] atorvastatin (LIPITOR) 80 MG tablet Take 0.5 tablets (40 mg total) by mouth daily.   [DISCONTINUED] ferrous sulfate 325 (65 FE) MG tablet Take 1 tablet (325 mg total) by mouth 2 (two) times daily with a meal.   [DISCONTINUED] guaiFENesin-dextromethorphan (ROBITUSSIN DM) 100-10 MG/5ML syrup Take 10 mLs by mouth every 4 (four) hours as needed for cough.   [DISCONTINUED] predniSONE (DELTASONE) 50 MG tablet Take 50 mg (one tablet) 13 hours before procedure. Take 50 mg (once tablet) 7 house before procedure. Take 50 mg (one tablet) before leaving home before procedure.     Allergies:   Morphine and codeine, Penicillins, Percocet  [oxycodone-acetaminophen], Ace inhibitors, Iodinated contrast media, Iodine, Etodolac, Tramadol, and Vicodin [hydrocodone-acetaminophen]   Social History   Socioeconomic History   Marital status: Divorced    Spouse name: Not on file   Number of children: 2   Years of education: 13.5   Highest education level: Not on file  Occupational History   Occupation: unemployed  Tobacco Use   Smoking status: Never   Smokeless tobacco: Never  Vaping Use   Vaping status: Never Used  Substance and Sexual Activity   Alcohol use: Yes    Alcohol/week: 0.0 standard drinks of alcohol    Comment: rarely; "I don't drink.  But I like it."   Drug use: No   Sexual activity: Yes  Other Topics Concern   Not on file  Social History Narrative   Left handed   Drinks caffeine   One level home   Social Determinants of Health   Financial Resource Strain: Not on  file  Food Insecurity: No Food Insecurity (07/02/2023)   Hunger Vital Sign    Worried About Running Out of Food in the Last Year: Never true    Ran Out of Food in the Last Year: Never true  Transportation Needs: No Transportation Needs (07/02/2023)   PRAPARE - Administrator, Civil Service (Medical): No    Lack of Transportation (Non-Medical): No  Physical Activity: Not on file  Stress: Not on file  Social Connections: Not on file     Family History: The patient's family history includes Diabetes in her father; Heart failure in her mother and sister; Hypertension in her daughter, mother, and sister.  ROS:   Review of Systems  Constitution: Negative for decreased appetite, fever and weight gain.  HENT: Negative for congestion, ear discharge, hoarse voice and sore throat.   Eyes: Negative for discharge, redness, vision loss in right eye and visual halos.  Cardiovascular: Negative for chest pain, dyspnea on exertion, leg swelling, orthopnea and palpitations.  Respiratory: Negative for cough, hemoptysis, shortness of breath and  snoring.   Endocrine: Negative for heat intolerance and polyphagia.  Hematologic/Lymphatic: Negative for bleeding problem. Does not bruise/bleed easily.  Skin: Negative for flushing, nail changes, rash and suspicious lesions.  Musculoskeletal: Negative for arthritis, joint pain, muscle cramps, myalgias, neck pain and stiffness.  Gastrointestinal: Negative for abdominal pain, bowel incontinence, diarrhea and excessive appetite.  Genitourinary: Negative for decreased libido, genital sores and incomplete emptying.  Neurological: Negative for brief paralysis, focal weakness, headaches and loss of balance.  Psychiatric/Behavioral: Negative for altered mental status, depression and suicidal ideas.  Allergic/Immunologic: Negative for HIV exposure and persistent infections.    EKGs/Labs/Other Studies Reviewed:    The following studies were reviewed today:   EKG:  The ekg ordered today demonstrates   Recent Labs: 07/05/2023: B Natriuretic Peptide 147.1 08/28/2023: ALT 13; BUN 18; Creatinine, Ser 1.29; Hemoglobin 12.1; Magnesium 2.1; Platelets 294; Potassium 4.2; Sodium 139  Recent Lipid Panel    Component Value Date/Time   CHOL 197 08/28/2023 1006   TRIG 178 (H) 08/28/2023 1006   HDL 34 (L) 08/28/2023 1006   CHOLHDL 5.8 (H) 08/28/2023 1006   CHOLHDL 5.0 05/25/2020 0107   VLDL 13 05/25/2020 0107   LDLCALC 131 (H) 08/28/2023 1006    Physical Exam:    VS:  BP 130/80 (BP Location: Left Arm, Patient Position: Sitting, Cuff Size: Normal)   Pulse 64   Ht 5\' 4"  (1.626 m)   Wt 235 lb 6.4 oz (106.8 kg)   SpO2 97%   BMI 40.41 kg/m     Wt Readings from Last 3 Encounters:  08/28/23 235 lb 6.4 oz (106.8 kg)  07/09/23 244 lb 0.8 oz (110.7 kg)  12/26/22 256 lb 3.2 oz (116.2 kg)     GEN: Well nourished, well developed in no acute distress HEENT: Normal NECK: No JVD; No carotid bruits LYMPHATICS: No lymphadenopathy CARDIAC: S1S2 noted,RRR, no murmurs, rubs, gallops RESPIRATORY:  Clear to  auscultation without rales, wheezing or rhonchi  ABDOMEN: Soft, non-tender, non-distended, +bowel sounds, no guarding. EXTREMITIES: No edema, No cyanosis, no clubbing MUSCULOSKELETAL:  No deformity  SKIN: Warm and dry NEUROLOGIC:  Alert and oriented x 3, non-focal PSYCHIATRIC:  Normal affect, good insight  ASSESSMENT:    1. Coronary artery disease involving native coronary artery of native heart, unspecified whether angina present   2. Aortic valve stenosis, etiology of cardiac valve disease unspecified   3. Pre-procedure lab exam   4. Hyperlipidemia,  unspecified hyperlipidemia type   5. PAF (paroxysmal atrial fibrillation) (HCC)   6. Shortness of breath    PLAN:     Coronary Artery Disease with symptoms concerning for angina. History of myocardial infarction in 2021 with stent placement. Currently experiencing intermittent chest pain, both at rest and with exertion. -Plan for urgent right and left heart catheterization to assess coronary arteries and right-sided pressures. -Obtain urgent echocardiogram to assess valve function and overall cardiac function.  Moderate Aortic Stenosis from echo in march of 2024. This could be causing her presenting symptom. She needs a repeat echo. Plan to reassess severity with urgent echocardiogram.  Shortness of Breath Noted to be significantly short of breath during conversation. -Plan to assess with right heart catheterization and echocardiogram.  Type 2 diabetes mellitus - Check blood glucose and HbA1c levels.  Paroxsymal atrial fibrillation - on amiodarone, Chadvasc score is 4- indication for anticoagulation. Not clear why she is not being anticoagulated. Will revisit this after cardiac catherterization. Plan to monitor with zio to understand PAF burden  Follow-up Plan to see patient back in clinic in approximately 4 weeks to discuss results of testing and next steps in management.  The patient is in agreement with the above plan. The  patient left the office in stable condition.  The patient will follow up in   Medication Adjustments/Labs and Tests Ordered: Current medicines are reviewed at length with the patient today.  Concerns regarding medicines are outlined above.  Orders Placed This Encounter  Procedures   Comprehensive Metabolic Panel (CMET)   Magnesium   Lipid panel   CBC with Differential/Platelet   ECHOCARDIOGRAM COMPLETE   Meds ordered this encounter  Medications   DISCONTD: predniSONE (DELTASONE) 50 MG tablet    Sig: Take 50 mg (one tablet) 13 hours before procedure. Take 50 mg (once tablet) 7 house before procedure. Take 50 mg (one tablet) before leaving home before procedure.    Dispense:  3 tablet    Refill:  0    Patient Instructions  Medication Instructions:  Your physician recommends that you continue on your current medications as directed. Please refer to the Current Medication list given to you today.  *If you need a refill on your cardiac medications before your next appointment, please call your pharmacy*   Lab Work: CMET, Mag, Lipids, CBC If you have labs (blood work) drawn today and your tests are completely normal, you will receive your results only by: MyChart Message (if you have MyChart) OR A paper copy in the mail If you have any lab test that is abnormal or we need to change your treatment, we will call you to review the results.   Testing/Procedures: Your physician has requested that you have an echocardiogram. Echocardiography is a painless test that uses sound waves to create images of your heart. It provides your doctor with information about the size and shape of your heart and how well your heart's chambers and valves are working. This procedure takes approximately one hour. There are no restrictions for this procedure. Please do NOT wear cologne, perfume, aftershave, or lotions (deodorant is allowed). Please arrive 15 minutes prior to your appointment time.  Please  note: We ask at that you not bring children with you during ultrasound (echo/ vascular) testing. Due to room size and safety concerns, children are not allowed in the ultrasound rooms during exams. Our front office staff cannot provide observation of children in our lobby area while testing is being conducted. An adult accompanying  a patient to their appointment will only be allowed in the ultrasound room at the discretion of the ultrasound technician under special circumstances. We apologize for any inconvenience.   You are scheduled for a Cardiac Catheterization on Monday, November 18 with Dr. Bryan Lemma.  1. Please arrive at the [facility] (Main Entrance A) at [facility]: [address] [place], [place] [address] at 7:00 AM (This time is 2 hour(s) before your procedure to ensure your preparation). Free valet parking service is available. You will check in at ADMITTING. The support person will be asked to wait in the waiting room.  It is OK to have someone drop you off and come back when you are ready to be discharged.    Special note: Every effort is made to have your procedure done on time. Please understand that emergencies sometimes delay scheduled procedures.  2. Diet: Do not eat solid foods after midnight.  The patient may have clear liquids until 5am upon the day of the procedure.  3. Labs: You will need to have blood drawn on TODAY  4. Medication instructions in preparation for your procedure:   Contrast Allergy: Yes, Please take Prednisone 50mg  by mouth at: Thirteen hours prior to cath 6:00pm on Sunday Seven hours prior to cath 12:00am on Monday And prior to leaving home please take last dose of Prednisone 50mg  and Benadryl 50mg  by mouth.   Stop taking Lasix (Furosemide)  Monday, November 18   On the morning of your procedure, take your Aspirin 81 mg and any morning medicines NOT listed above.  You may use sips of water.  5. Plan to go home the same day, you will  only stay overnight if medically necessary. 6. Bring a current list of your medications and current insurance cards. 7. You MUST have a responsible person to drive you home. 8. Someone MUST be with you the first 24 hours after you arrive home or your discharge will be delayed. 9. Please wear clothes that are easy to get on and off and wear slip-on shoes.  Thank you for allowing [place] to care for you!   --  Invasive Cardiovascular services    Follow-Up: At [facility], you and your health needs are our priority.  As part of our continuing mission to provide you with exceptional heart care, we have created designated Provider Care Teams.  These Care Teams include your primary Cardiologist (physician) and Advanced Practice Providers (APPs -  Physician Assistants and Nurse Practitioners) who all work together to provide you with the care you need, when you need it.   Your next appointment:   4 week(s)  Provider:   Thomasene Ripple, DO     Adopting a Healthy Lifestyle.  Know what a healthy weight is for you (roughly BMI <25) and aim to maintain this   Aim for 7+ servings of fruits and vegetables daily   65-80+ fluid ounces of water or unsweet tea for healthy kidneys   Limit to max 1 drink of alcohol per day; avoid smoking/tobacco   Limit animal fats in diet for cholesterol and heart health - choose grass fed whenever available   Avoid highly processed foods, and foods high in saturated/trans fats   Aim for low stress - take time to unwind and care for your mental health   Aim for 150 min of moderate intensity exercise weekly for heart health, and weights twice weekly for bone health   Aim for 7-9 hours of  sleep daily   When it comes to diets, agreement about the perfect plan isnt easy to find, even among the experts. Experts at the [facility] [employer] developed an idea known as the Healthy Eating Plate. Just imagine a plate divided into logical, healthy  portions.   The emphasis is on diet quality:   Load up on vegetables and fruits - one-half of your plate: Aim for color and variety, and remember that potatoes dont count.   Go for whole grains - one-quarter of your plate: Whole wheat, barley, wheat berries, quinoa, oats, brown rice, and foods made with them. If you want pasta, go with whole wheat pasta.   Protein power - one-quarter of your plate: Fish, chicken, beans, and nuts are all healthy, versatile protein sources. Limit red meat.   The diet, however, does go beyond the plate, offering a few other suggestions.   Use healthy plant oils, such as olive, canola, soy, corn, sunflower and peanut. Check the labels, and avoid partially hydrogenated oil, which have unhealthy trans fats.   If youre thirsty, drink water. Coffee and tea are good in moderation, but skip sugary drinks and limit milk and dairy products to one or two daily servings.   The type of carbohydrate in the diet is more important than the amount. Some sources of carbohydrates, such as vegetables, fruits, whole grains, and beans-are healthier than others.   Finally, stay active  Signed, Thomasene Ripple, DO  08/29/2023 8:45 PM    [facility]

## 2023-08-29 NOTE — Telephone Encounter (Signed)
Pt daughter called in stating they missed echo which was urgent, per last phone note. She wants to know what Dr. Servando Salina suggest they do now. Please advise.

## 2023-08-29 NOTE — Telephone Encounter (Signed)
Cardiac Catheterization scheduled at [facility] for: Monday September 02, 2023 12 Noon Arrival time [facility] Main Entrance A at: 7 AM-pre-procedure hydration  Nothing to eat after midnight prior to procedure, clear liquids until 5 AM day of procedure.  CONTRAST ALLERGY: 13 hour Prednisone and Benadryl Prep: 09/01/23 Prednisone 50 mg 11 PM 09/02/23 Prednisone 50 mg 5 AM 09/02/23 Prednisone 50 mg and Benadryl 50 mg at 10 AM at the hospital. I have asked pt's daughter to have patient take medication with her to hospital and take at hospital at 10 AM.  Medication instructions: -Hold:  Lasix /KCl-day before and day of procedure per protocol GFR <60 (44) -Other usual morning medications can be taken with sips of water including aspirin 81 mg and Plavix 75 mg  Reviewed instructions with patient's daughter (DPR), Candace.

## 2023-08-29 NOTE — Telephone Encounter (Signed)
Patient arrived late for echo appointment states she got lost. We offered another appointment in mid conversation she walked out left waiting room. Called her cell phone to offer her an end of day appt to have her return and she refused same day & did not want to reschedule this appt.

## 2023-08-30 ENCOUNTER — Telehealth: Payer: Self-pay | Admitting: Cardiology

## 2023-08-30 NOTE — Telephone Encounter (Signed)
Spoke to [employer] patient's daughter and she reported she had already spoke to someone concerning scheduling.

## 2023-08-30 NOTE — Telephone Encounter (Signed)
Pt is calling checking on status of getting Echo r/s.

## 2023-08-30 NOTE — Telephone Encounter (Signed)
Tobb, Kardie, DO  You; Auxier, [place], [place] minutes ago (10:13 AM)   She still need her cardiac cath regardless of not getting the echo  _____________________________________________________________ Patient identification verified by 2 forms. Marilynn Rail, RN   Called and spoke to patients daughter Thomes Dinning provider message  Advised candice to keep 11/18 plan for cardiac cath  Candice verbalized understanding, no questions at this time

## 2023-08-30 NOTE — Telephone Encounter (Signed)
Received a staff message that patient missed her echo appointment and needed to reschedule. I called the main number on file and the patients daughter picked up. She stated that she did want to reschedule - and was not sure why the echo was originally scheduled in [place] being that office is too far away. I offered Nov 25th at [facility] but she stated that the echo needed to be prior to her procedure on Monday 11/18. We do not have anything available for today for an echo, not sure if procedure for Monday with Dr. Herbie Baltimore will need to be rescheduled. Please advise.

## 2023-08-31 ENCOUNTER — Inpatient Hospital Stay (HOSPITAL_COMMUNITY): Payer: 59

## 2023-08-31 ENCOUNTER — Inpatient Hospital Stay (HOSPITAL_COMMUNITY)
Admission: EM | Admit: 2023-08-31 | Discharge: 2023-09-05 | DRG: 064 | Disposition: A | Discharge status: Home Health | Payer: 59 | Attending: Family Medicine | Admitting: Family Medicine

## 2023-08-31 ENCOUNTER — Emergency Department (HOSPITAL_COMMUNITY): Payer: 59

## 2023-08-31 DIAGNOSIS — Z7901 Long term (current) use of anticoagulants: Secondary | ICD-10-CM

## 2023-08-31 DIAGNOSIS — Z6841 Body Mass Index (BMI) 40.0 and over, adult: Secondary | ICD-10-CM | POA: Diagnosis not present

## 2023-08-31 DIAGNOSIS — J9602 Acute respiratory failure with hypercapnia: Secondary | ICD-10-CM | POA: Diagnosis present

## 2023-08-31 DIAGNOSIS — R29704 NIHSS score 4: Secondary | ICD-10-CM | POA: Diagnosis not present

## 2023-08-31 DIAGNOSIS — Z880 Allergy status to penicillin: Secondary | ICD-10-CM

## 2023-08-31 DIAGNOSIS — I252 Old myocardial infarction: Secondary | ICD-10-CM | POA: Diagnosis not present

## 2023-08-31 DIAGNOSIS — E785 Hyperlipidemia, unspecified: Secondary | ICD-10-CM | POA: Diagnosis present

## 2023-08-31 DIAGNOSIS — I639 Cerebral infarction, unspecified: Secondary | ICD-10-CM | POA: Diagnosis not present

## 2023-08-31 DIAGNOSIS — D631 Anemia in chronic kidney disease: Secondary | ICD-10-CM | POA: Diagnosis not present

## 2023-08-31 DIAGNOSIS — T450X5A Adverse effect of antiallergic and antiemetic drugs, initial encounter: Secondary | ICD-10-CM | POA: Diagnosis not present

## 2023-08-31 DIAGNOSIS — R079 Chest pain, unspecified: Secondary | ICD-10-CM | POA: Diagnosis not present

## 2023-08-31 DIAGNOSIS — R0989 Other specified symptoms and signs involving the circulatory and respiratory systems: Secondary | ICD-10-CM | POA: Diagnosis not present

## 2023-08-31 DIAGNOSIS — Z7902 Long term (current) use of antithrombotics/antiplatelets: Secondary | ICD-10-CM

## 2023-08-31 DIAGNOSIS — R4182 Altered mental status, unspecified: Principal | ICD-10-CM

## 2023-08-31 DIAGNOSIS — G4733 Obstructive sleep apnea (adult) (pediatric): Secondary | ICD-10-CM | POA: Diagnosis present

## 2023-08-31 DIAGNOSIS — I25110 Atherosclerotic heart disease of native coronary artery with unstable angina pectoris: Secondary | ICD-10-CM | POA: Diagnosis present

## 2023-08-31 DIAGNOSIS — N1832 Chronic kidney disease, stage 3b: Secondary | ICD-10-CM | POA: Diagnosis present

## 2023-08-31 DIAGNOSIS — Z888 Allergy status to other drugs, medicaments and biological substances status: Secondary | ICD-10-CM

## 2023-08-31 DIAGNOSIS — E1122 Type 2 diabetes mellitus with diabetic chronic kidney disease: Secondary | ICD-10-CM | POA: Diagnosis present

## 2023-08-31 DIAGNOSIS — I200 Unstable angina: Secondary | ICD-10-CM | POA: Diagnosis not present

## 2023-08-31 DIAGNOSIS — R0902 Hypoxemia: Secondary | ICD-10-CM | POA: Diagnosis not present

## 2023-08-31 DIAGNOSIS — I272 Pulmonary hypertension, unspecified: Secondary | ICD-10-CM | POA: Diagnosis not present

## 2023-08-31 DIAGNOSIS — I5033 Acute on chronic diastolic (congestive) heart failure: Secondary | ICD-10-CM | POA: Diagnosis not present

## 2023-08-31 DIAGNOSIS — I6782 Cerebral ischemia: Secondary | ICD-10-CM | POA: Diagnosis not present

## 2023-08-31 DIAGNOSIS — D649 Anemia, unspecified: Secondary | ICD-10-CM | POA: Diagnosis not present

## 2023-08-31 DIAGNOSIS — J9601 Acute respiratory failure with hypoxia: Secondary | ICD-10-CM | POA: Diagnosis present

## 2023-08-31 DIAGNOSIS — Z79899 Other long term (current) drug therapy: Secondary | ICD-10-CM

## 2023-08-31 DIAGNOSIS — I5021 Acute systolic (congestive) heart failure: Secondary | ICD-10-CM

## 2023-08-31 DIAGNOSIS — G8191 Hemiplegia, unspecified affecting right dominant side: Secondary | ICD-10-CM | POA: Diagnosis present

## 2023-08-31 DIAGNOSIS — Z7982 Long term (current) use of aspirin: Secondary | ICD-10-CM

## 2023-08-31 DIAGNOSIS — I10 Essential (primary) hypertension: Secondary | ICD-10-CM | POA: Diagnosis not present

## 2023-08-31 DIAGNOSIS — N189 Chronic kidney disease, unspecified: Secondary | ICD-10-CM | POA: Diagnosis not present

## 2023-08-31 DIAGNOSIS — M109 Gout, unspecified: Secondary | ICD-10-CM | POA: Diagnosis present

## 2023-08-31 DIAGNOSIS — E669 Obesity, unspecified: Secondary | ICD-10-CM | POA: Diagnosis present

## 2023-08-31 DIAGNOSIS — I480 Paroxysmal atrial fibrillation: Secondary | ICD-10-CM | POA: Diagnosis present

## 2023-08-31 DIAGNOSIS — G9341 Metabolic encephalopathy: Secondary | ICD-10-CM | POA: Diagnosis not present

## 2023-08-31 DIAGNOSIS — R404 Transient alteration of awareness: Secondary | ICD-10-CM | POA: Diagnosis not present

## 2023-08-31 DIAGNOSIS — Z7951 Long term (current) use of inhaled steroids: Secondary | ICD-10-CM

## 2023-08-31 DIAGNOSIS — Z91041 Radiographic dye allergy status: Secondary | ICD-10-CM

## 2023-08-31 DIAGNOSIS — I130 Hypertensive heart and chronic kidney disease with heart failure and stage 1 through stage 4 chronic kidney disease, or unspecified chronic kidney disease: Secondary | ICD-10-CM | POA: Diagnosis not present

## 2023-08-31 DIAGNOSIS — Z8673 Personal history of transient ischemic attack (TIA), and cerebral infarction without residual deficits: Secondary | ICD-10-CM

## 2023-08-31 DIAGNOSIS — I6381 Other cerebral infarction due to occlusion or stenosis of small artery: Secondary | ICD-10-CM | POA: Diagnosis not present

## 2023-08-31 DIAGNOSIS — Z885 Allergy status to narcotic agent status: Secondary | ICD-10-CM

## 2023-08-31 DIAGNOSIS — R0600 Dyspnea, unspecified: Secondary | ICD-10-CM | POA: Diagnosis not present

## 2023-08-31 DIAGNOSIS — Z8249 Family history of ischemic heart disease and other diseases of the circulatory system: Secondary | ICD-10-CM

## 2023-08-31 DIAGNOSIS — N179 Acute kidney failure, unspecified: Secondary | ICD-10-CM | POA: Diagnosis present

## 2023-08-31 DIAGNOSIS — I663 Occlusion and stenosis of cerebellar arteries: Secondary | ICD-10-CM | POA: Diagnosis not present

## 2023-08-31 DIAGNOSIS — I350 Nonrheumatic aortic (valve) stenosis: Secondary | ICD-10-CM | POA: Diagnosis present

## 2023-08-31 DIAGNOSIS — I517 Cardiomegaly: Secondary | ICD-10-CM | POA: Diagnosis not present

## 2023-08-31 DIAGNOSIS — J9691 Respiratory failure, unspecified with hypoxia: Secondary | ICD-10-CM | POA: Diagnosis present

## 2023-08-31 DIAGNOSIS — I25118 Atherosclerotic heart disease of native coronary artery with other forms of angina pectoris: Secondary | ICD-10-CM | POA: Diagnosis not present

## 2023-08-31 DIAGNOSIS — J449 Chronic obstructive pulmonary disease, unspecified: Secondary | ICD-10-CM | POA: Diagnosis not present

## 2023-08-31 DIAGNOSIS — I6529 Occlusion and stenosis of unspecified carotid artery: Secondary | ICD-10-CM | POA: Diagnosis not present

## 2023-08-31 DIAGNOSIS — R569 Unspecified convulsions: Secondary | ICD-10-CM | POA: Diagnosis not present

## 2023-08-31 DIAGNOSIS — Z96641 Presence of right artificial hip joint: Secondary | ICD-10-CM | POA: Diagnosis present

## 2023-08-31 DIAGNOSIS — R0689 Other abnormalities of breathing: Secondary | ICD-10-CM | POA: Diagnosis not present

## 2023-08-31 DIAGNOSIS — Z955 Presence of coronary angioplasty implant and graft: Secondary | ICD-10-CM

## 2023-08-31 DIAGNOSIS — I6389 Other cerebral infarction: Secondary | ICD-10-CM | POA: Diagnosis not present

## 2023-08-31 DIAGNOSIS — R400 Somnolence: Secondary | ICD-10-CM | POA: Diagnosis not present

## 2023-08-31 DIAGNOSIS — Z833 Family history of diabetes mellitus: Secondary | ICD-10-CM

## 2023-08-31 DIAGNOSIS — G928 Other toxic encephalopathy: Secondary | ICD-10-CM | POA: Diagnosis present

## 2023-08-31 DIAGNOSIS — K219 Gastro-esophageal reflux disease without esophagitis: Secondary | ICD-10-CM | POA: Diagnosis present

## 2023-08-31 LAB — CBC WITH DIFFERENTIAL/PLATELET
Abs Immature Granulocytes: 0.06 10*3/uL (ref 0.00–0.07)
Basophils Absolute: 0.1 10*3/uL (ref 0.0–0.1)
Basophils Relative: 1 %
Eosinophils Absolute: 0 10*3/uL (ref 0.0–0.5)
Eosinophils Relative: 0 %
HCT: 35.3 % — ABNORMAL LOW (ref 36.0–46.0)
Hemoglobin: 11.1 g/dL — ABNORMAL LOW (ref 12.0–15.0)
Immature Granulocytes: 1 %
Lymphocytes Relative: 22 %
Lymphs Abs: 1.7 10*3/uL (ref 0.7–4.0)
MCH: 29 pg (ref 26.0–34.0)
MCHC: 31.4 g/dL (ref 30.0–36.0)
MCV: 92.2 fL (ref 80.0–100.0)
Monocytes Absolute: 0.6 10*3/uL (ref 0.1–1.0)
Monocytes Relative: 8 %
Neutro Abs: 5.5 10*3/uL (ref 1.7–7.7)
Neutrophils Relative %: 68 %
Platelets: 269 10*3/uL (ref 150–400)
RBC: 3.83 MIL/uL — ABNORMAL LOW (ref 3.87–5.11)
RDW: 16.8 % — ABNORMAL HIGH (ref 11.5–15.5)
WBC: 8 10*3/uL (ref 4.0–10.5)
nRBC: 0 % (ref 0.0–0.2)

## 2023-08-31 LAB — I-STAT ARTERIAL BLOOD GAS, ED
Acid-Base Excess: 2 mmol/L (ref 0.0–2.0)
Bicarbonate: 29.8 mmol/L — ABNORMAL HIGH (ref 20.0–28.0)
Calcium, Ion: 1.28 mmol/L (ref 1.15–1.40)
HCT: 39 % (ref 36.0–46.0)
Hemoglobin: 13.3 g/dL (ref 12.0–15.0)
O2 Saturation: 100 %
Patient temperature: 98.6
Potassium: 4.5 mmol/L (ref 3.5–5.1)
Sodium: 138 mmol/L (ref 135–145)
TCO2: 32 mmol/L (ref 22–32)
pCO2 arterial: 57.7 mm[Hg] — ABNORMAL HIGH (ref 32–48)
pH, Arterial: 7.321 — ABNORMAL LOW (ref 7.35–7.45)
pO2, Arterial: 246 mm[Hg] — ABNORMAL HIGH (ref 83–108)

## 2023-08-31 LAB — I-STAT CHEM 8, ED
BUN: 30 mg/dL — ABNORMAL HIGH (ref 8–23)
Calcium, Ion: 1.09 mmol/L — ABNORMAL LOW (ref 1.15–1.40)
Chloride: 105 mmol/L (ref 98–111)
Creatinine, Ser: 2 mg/dL — ABNORMAL HIGH (ref 0.44–1.00)
Glucose, Bld: 135 mg/dL — ABNORMAL HIGH (ref 70–99)
HCT: 37 % (ref 36.0–46.0)
Hemoglobin: 12.6 g/dL (ref 12.0–15.0)
Potassium: 4.9 mmol/L (ref 3.5–5.1)
Sodium: 140 mmol/L (ref 135–145)
TCO2: 29 mmol/L (ref 22–32)

## 2023-08-31 LAB — SALICYLATE LEVEL: Salicylate Lvl: <7 mg/dL — ABNORMAL LOW (ref 7.0–30.0)

## 2023-08-31 LAB — COMPREHENSIVE METABOLIC PANEL
ALT: 11 U/L (ref 0–44)
AST: 19 U/L (ref 15–41)
Albumin: 3.3 g/dL — ABNORMAL LOW (ref 3.5–5.0)
Alkaline Phosphatase: 53 U/L (ref 38–126)
Anion gap: 9 (ref 5–15)
BUN: 27 mg/dL — ABNORMAL HIGH (ref 8–23)
CO2: 27 mmol/L (ref 22–32)
Calcium: 8.7 mg/dL — ABNORMAL LOW (ref 8.9–10.3)
Chloride: 103 mmol/L (ref 98–111)
Creatinine, Ser: 2.07 mg/dL — ABNORMAL HIGH (ref 0.44–1.00)
GFR, Estimated: 25 mL/min — ABNORMAL LOW (ref >60)
Glucose, Bld: 138 mg/dL — ABNORMAL HIGH (ref 70–99)
Potassium: 5.4 mmol/L — ABNORMAL HIGH (ref 3.5–5.1)
Sodium: 139 mmol/L (ref 135–145)
Total Bilirubin: 0.8 mg/dL (ref <1.2)
Total Protein: 7.1 g/dL (ref 6.5–8.1)

## 2023-08-31 LAB — TROPONIN I (HIGH SENSITIVITY)
Troponin I (High Sensitivity): 86 ng/L — ABNORMAL HIGH (ref <18)
Troponin I (High Sensitivity): 183 ng/L (ref <18)

## 2023-08-31 LAB — PROCALCITONIN: Procalcitonin: <0.1 ng/mL

## 2023-08-31 LAB — I-STAT CG4 LACTIC ACID, ED
Lactic Acid, Venous: 1.4 mmol/L (ref 0.5–1.9)
Lactic Acid, Venous: 1 mmol/L (ref 0.5–1.9)

## 2023-08-31 LAB — MRSA NEXT GEN BY PCR, NASAL: MRSA by PCR Next Gen: DETECTED — AB

## 2023-08-31 LAB — GLUCOSE, CAPILLARY
Glucose-Capillary: 89 mg/dL (ref 70–99)
Glucose-Capillary: 78 mg/dL (ref 70–99)
Glucose-Capillary: 89 mg/dL (ref 70–99)
Glucose-Capillary: 93 mg/dL (ref 70–99)

## 2023-08-31 LAB — TSH: TSH: 4.53 u[IU]/mL — ABNORMAL HIGH (ref 0.350–4.500)

## 2023-08-31 LAB — BRAIN NATRIURETIC PEPTIDE: B Natriuretic Peptide: 377.4 pg/mL — ABNORMAL HIGH (ref 0.0–100.0)

## 2023-08-31 LAB — ACETAMINOPHEN LEVEL: Acetaminophen (Tylenol), Serum: <10 ug/mL — ABNORMAL LOW (ref 10–30)

## 2023-08-31 LAB — CBG MONITORING, ED: Glucose-Capillary: 147 mg/dL — ABNORMAL HIGH (ref 70–99)

## 2023-08-31 LAB — AMMONIA: Ammonia: 31 umol/L (ref 9–35)

## 2023-08-31 MED ORDER — POLYETHYLENE GLYCOL 3350 17 G PO PACK
17.0000 g | PACK | Freq: Every day | ORAL | Status: DC | PRN
Start: 2023-08-31 — End: 2023-09-02

## 2023-08-31 MED ORDER — CHLORHEXIDINE GLUCONATE CLOTH 2 % EX PADS
6.0000 | MEDICATED_PAD | Freq: Every day | CUTANEOUS | Status: DC
  Administered 2023-08-31 – 2023-09-03 (×4): 6 via TOPICAL

## 2023-08-31 MED ORDER — NALOXONE HCL 4 MG/0.1ML NA LIQD: 2.0000 mg | Freq: Once | NASAL | Status: DC

## 2023-08-31 MED ORDER — NALOXONE HCL 2 MG/2ML IJ SOSY
2.0000 mg | PREFILLED_SYRINGE | Freq: Once | INTRAMUSCULAR | Status: AC
  Administered 2023-08-31: 2 mg via NASAL

## 2023-08-31 MED ORDER — FUROSEMIDE 10 MG/ML IJ SOLN
40.0000 mg | Freq: Once | INTRAMUSCULAR | Status: AC
  Administered 2023-08-31: 40 mg via INTRAVENOUS
  Filled 2023-08-31 (×2): qty 4

## 2023-08-31 MED ORDER — MUPIROCIN 2 % EX OINT
1.0000 | TOPICAL_OINTMENT | Freq: Two times a day (BID) | CUTANEOUS | Status: AC
  Administered 2023-08-31 – 2023-09-05 (×10): 1 via NASAL
  Filled 2023-08-31 (×3): qty 22

## 2023-08-31 MED ORDER — DOCUSATE SODIUM 100 MG PO CAPS: 100.0000 mg | ORAL_CAPSULE | Freq: Two times a day (BID) | ORAL | Status: DC | PRN

## 2023-08-31 NOTE — H&P (Signed)
NAME:  Sherry Fitzpatrick, MRN:  [id number], DOB:  [birth year], LOS: 0 ADMISSION DATE:  08/31/2023, CONSULTATION DATE:  08/31/23 REFERRING MD:  Rodena Medin, CHIEF COMPLAINT:  ACUTE HYPOXIC AND HYPERCARBIC RESP FAILURE    History of Present Illness:  72 year old female w/ significant PMH as outlined below. Just seen in cardiology office w/ report of intermittent chest pain and SOB. Reports not associated w/ activity or rest can come on and resolve spont. She had become more pale and also had walkin clinic visit at [facility] for dizziness the day before her cards eval. She was started on antivert and zofran to address vertigo and was being set up for further cardiac evaluation including echo and cardiac cath.  Per daughter pt has been getting more sleepy, withdrawn, and confused.   Pertinent  Medical History  COPD, obesity, type II DM, CAD s/p prior DESW w/mod AS, chronic diastolic hf, afib, CKD stage 3b, GERD, OSA, afib (not on AC), chronic anemia, HLD, GERD  Significant Hospital Events: Including procedures, antibiotic start and stop dates in addition to other pertinent events   11/16 admitted w/ acute metabolic encephalopathy 2/2 decomp HF, Hypercarbia and sedating drugs   Interim History / Subjective:  Resting in bed on bipap   Objective   Blood pressure (!) 121/53, pulse 60, temperature 97.9 F (36.6 C), temperature source Axillary, resp. rate 19, SpO2 100%.    Vent Mode: PCV;BIPAP FiO2 (%):  [50 %] 50 % Set Rate:  [20 bmp] 20 bmp  No intake or output data in the 24 hours ending 08/31/23 1334 There were no vitals filed for this visit.  Examination: General: 72 year old female resting in bed. She is in no distress but is withdrawn HENT: NCAT neck large. Is having some trouble w/ oral secretions Lungs: decreased t/o. Does have some scattered rhonchi. Pcxr CM w/ vasc congestion  Cardiovascular: RRR Abdomen: soft  Extremities: warm trace edema Neuro: awake, weak more on right. I cannot  understand what she is saying. Does f/c GU: DTV  Resolved Hospital Problem list     Assessment & Plan:  Acute metabolic encephalopathy. Due in part d/t hypercarbia but sedating drugs, underlying obstructive lung disease and PCO2 retention as well as. Also w/ concern about focal motor def consider stroke Plan Holding sedating meds  Send UDS Ck ammonia, asa and apap cultures and TSH BIPAP MRI r/o stroke  Acute hypercarbic resp failure 2/2 pulmonary edema superimposed on underlying obstructive lung disease and obesity  -carries h/o "copd" seems like more likely chronic/fixed asthma w/ exertional dyspnea from HF Plan Cont NIPPV F/u abg Scheduled BDs and ICS IV lasix received  Coronary artery disease w/ intermittent angina and h/o  prior DES and known AS and atrial fib  Plan Stat echo Tele  Rate control  Cycle CEs  Acute on chronic renal failure  Stage IIIB Plan Serial chems Renal dose meds Strict I&O  Type II DM Plan Ssi goal 140-180    Best Practice (right click and "Reselect all SmartList Selections" daily)   Diet/type: NPO DVT prophylaxis: SCD GI prophylaxis: N/A Lines: N/A Foley:  N/A Code Status:  full code Last date of multidisciplinary goals of care discussion [pending]  Labs   CBC: Recent Labs  Lab 08/28/23 1006 08/31/23 1146 08/31/23 1151 08/31/23 1222  WBC 5.8 8.0  --   --   NEUTROABS 2.9 5.5  --   --   HGB 12.1 11.1* 13.3 12.6  HCT 37.3 35.3* 39.0 37.0  MCV 88 92.2  --   --   PLT 294 269  --   --     Basic Metabolic Panel: Recent Labs  Lab 08/28/23 1006 08/31/23 1146 08/31/23 1151 08/31/23 1222  NA 139 139 138 140  K 4.2 5.4* 4.5 4.9  CL 101 103  --  105  CO2 22 27  --   --   GLUCOSE 142* 138*  --  135*  BUN 18 27*  --  30*  CREATININE 1.29* 2.07*  --  2.00*  CALCIUM 9.2 8.7*  --   --   MG 2.1  --   --   --    GFR: Estimated Creatinine Clearance: 29.4 mL/min (A) (by C-G formula based on SCr of 2 mg/dL (H)). Recent Labs   Lab 08/28/23 1006 08/31/23 1146 08/31/23 1223  WBC 5.8 8.0  --   LATICACIDVEN  --   --  1.4    Liver Function Tests: Recent Labs  Lab 08/28/23 1006 08/31/23 1146  AST 15 19  ALT 13 11  ALKPHOS 82 53  BILITOT 0.2 0.8  PROT 7.2 7.1  ALBUMIN 3.9 3.3*   No results for input(s): "LIPASE", "AMYLASE" in the last 168 hours. No results for input(s): "AMMONIA" in the last 168 hours.  ABG    Component Value Date/Time   PHART 7.321 (L) 08/31/2023 1151   PCO2ART 57.7 (H) 08/31/2023 1151   PO2ART 246 (H) 08/31/2023 1151   HCO3 29.8 (H) 08/31/2023 1151   TCO2 29 08/31/2023 1222   ACIDBASEDEF 5.0 (H) 05/28/2020 0052   O2SAT 100 08/31/2023 1151     Coagulation Profile: No results for input(s): "INR", "PROTIME" in the last 168 hours.  Cardiac Enzymes: No results for input(s): "CKTOTAL", "CKMB", "CKMBINDEX", "TROPONINI" in the last 168 hours.  HbA1C: Hgb A1c MFr Bld  Date/Time Value Ref Range Status  07/01/2023 11:51 PM 5.5 4.8 - 5.6 % Final    Comment:    (NOTE) Pre diabetes:          5.7%-6.4%  Diabetes:              >6.4%  Glycemic control for   <7.0% adults with diabetes   05/24/2020 05:03 PM 7.3 (H) 4.8 - 5.6 % Final    Comment:    (NOTE) Pre diabetes:          5.7%-6.4%  Diabetes:              >6.4%  Glycemic control for   <7.0% adults with diabetes     CBG: Recent Labs  Lab 08/31/23 1127  GLUCAP 147*    Review of Systems:   Not able   Past Medical History:  She,  has a past medical history of Acute non-Q wave non-ST elevation myocardial infarction (NSTEMI) (HCC) (05/24/2020), Acute respiratory failure with hypoxia (HCC) (11/22/2019), AKI (acute kidney injury) (HCC) (07/03/2018), Alterations of sensations, late effect of cerebrovascular disease(438.6), Arthritis, Backache, unspecified, Body mass index 40.0-44.9, adult (HCC), Carpal tunnel syndrome, Cerebral thrombosis with cerebral infarction (05/29/2020), Dizziness and giddiness, Esophageal reflux,  Generalized pain, Gout attack (03/28/2020), Headache, Heart murmur, Hypertensive encephalopathy (03/28/2020), Mild cognitive impairment (07/16/2022), NSTEMI (non-ST elevated myocardial infarction) (HCC), Other abnormal glucose, Other and unspecified hyperlipidemia, Other malaise and fatigue, Other postprocedural status(V45.89), Other symptoms involving cardiovascular system, Shock (HCC), Sleep apnea, Stroke (HCC) (06/2003; 02/2010), Unspecified disorder of the pituitary gland and its hypothalamic control, Unspecified essential hypertension, and Unspecified visual disturbance.   Surgical History:   Past Surgical  History:  Procedure Laterality Date   CARPAL TUNNEL RELEASE Left ~ 2014   CORONARY STENT INTERVENTION N/A 05/24/2020   Procedure: CORONARY STENT INTERVENTION;  Surgeon: Corky Crafts, MD;  [facility] INVASIVE CV LAB;  Service: Cardiovascular;  Laterality: N/A;   JOINT REPLACEMENT     LEFT HEART CATH AND CORONARY ANGIOGRAPHY N/A 05/24/2020   Procedure: LEFT HEART CATH AND CORONARY ANGIOGRAPHY;  Surgeon: Rinaldo Cloud, MD;  Location: [facility] INVASIVE CV LAB;  Service: Cardiovascular;  Laterality: N/A;   TOTAL HIP ARTHROPLASTY Right 11/06/2010   TRANSPHENOIDAL / TRANSNASAL HYPOPHYSECTOMY / RESECTION PITUITARY TUMOR  06/13/2012   TUBAL LIGATION  1974     Social History:   reports that she has never smoked. She has never used smokeless tobacco. She reports current alcohol use. She reports that she does not use drugs.   Family History:  Her family history includes Diabetes in her father; Heart failure in her mother and sister; Hypertension in her daughter, mother, and sister.   Allergies Allergies  Allergen Reactions   Morphine And Codeine Hives and Nausea And Vomiting   Penicillins Hives    Has patient had a PCN reaction causing immediate rash, facial/tongue/throat swelling, SOB or lightheadedness with hypotension: Yes Has patient had a PCN reaction causing severe rash involving mucus  membranes or skin necrosis: No Has patient had a PCN reaction that required hospitalization: No Has patient had a PCN reaction occurring within the last 10 years: No If all of the above answers are "NO", then may proceed with Cephalosporin use.   Percocet [Oxycodone-Acetaminophen] Nausea And Vomiting   Ace Inhibitors Nausea And Vomiting and Cough   Iodinated Contrast Media Nausea And Vomiting        Iodine Itching   Etodolac Nausea And Vomiting   Tramadol Nausea And Vomiting   Vicodin [Hydrocodone-Acetaminophen] Nausea And Vomiting     Home Medications  Prior to Admission medications   Medication Sig Start Date End Date Taking? Authorizing Provider  acetaminophen (TYLENOL) 325 MG tablet Take 2 tablets (650 mg total) by mouth every 6 (six) hours as needed (Mild pain). Patient taking differently: Take 650 mg by mouth as needed for mild pain (pain score 1-3) or moderate pain (pain score 4-6). 06/09/20   Angiulli, Mcarthur Rossetti, PA-C  albuterol (VENTOLIN HFA) 108 (90 Base) MCG/ACT inhaler Inhale 2 puffs into the lungs every 6 (six) hours as needed for wheezing. 12/26/22   Pokhrel, Rebekah Chesterfield, MD  amiodarone (PACERONE) 200 MG tablet Take 100 mg by mouth in the morning.    [provider]  amLODipine (NORVASC) 5 MG tablet Take 5 mg by mouth in the morning. 08/01/23   [provider]  aspirin 81 MG chewable tablet Chew 81 mg by mouth in the morning.    [provider]  atorvastatin (LIPITOR) 40 MG tablet Take 40 mg by mouth at bedtime. 08/01/23   [provider]  budesonide-formoterol (SYMBICORT) 160-4.5 MCG/ACT inhaler Inhale 2 puffs into the lungs 2 (two) times daily. Patient taking differently: Inhale 2 puffs into the lungs in the morning. 07/09/23   Glade Lloyd, MD  carvedilol (COREG) 6.25 MG tablet Take 1 tablet (6.25 mg total) by mouth 2 (two) times daily. 12/26/22 06/30/24  Pokhrel, Rebekah Chesterfield, MD  clopidogrel (PLAVIX) 75 MG tablet Take 1 tablet (75 mg total) by mouth  daily. 06/09/20   Angiulli, Mcarthur Rossetti, PA-C  ferrous sulfate 325 (65 FE) MG EC tablet Take 325 mg by mouth in the morning.  [provider]  furosemide (LASIX) 40 MG tablet Take 1 tablet (40 mg total) by mouth daily at 2 PM. Patient taking differently: Take 40 mg by mouth in the morning. 12/26/22 06/30/24  Pokhrel, Rebekah Chesterfield, MD  hydrALAZINE (APRESOLINE) 25 MG tablet Take 1 tablet (25 mg total) by mouth every 8 (eight) hours. 12/26/22 06/30/24  Pokhrel, Rebekah Chesterfield, MD  ipratropium-albuterol (DUONEB) 0.5-2.5 (3) MG/3ML SOLN Take 3 mLs by nebulization every 6 (six) hours as needed (wheezing/shortness of breath). 07/09/23   Glade Lloyd, MD  isosorbide mononitrate (IMDUR) 120 MG 24 hr tablet Take 120 mg by mouth in the morning.    [provider]  meclizine (ANTIVERT) 25 MG tablet Take 25 mg by mouth 2 (two) times daily as needed (vertigo). 08/27/23   [provider]  nitroGLYCERIN (NITROSTAT) 0.4 MG SL tablet Place 1 tablet (0.4 mg total) under the tongue every 5 (five) minutes x 3 doses as needed for chest pain. 05/25/20   Rinaldo Cloud, MD  ondansetron (ZOFRAN) 8 MG tablet Take 8 mg by mouth 2 (two) times daily as needed for nausea or vomiting (vertigo). 08/27/23   [provider]  pantoprazole (PROTONIX) 40 MG tablet Take 1 tablet (40 mg total) by mouth daily. 07/09/23   Glade Lloyd, MD  polyethylene glycol (MIRALAX / GLYCOLAX) 17 g packet Take 17 g by mouth daily as needed for moderate constipation. 07/09/23   Glade Lloyd, MD  potassium chloride SA (KLOR-CON M) 20 MEQ tablet Take 1 tablet (20 mEq total) by mouth daily. 12/26/22   Pokhrel, Rebekah Chesterfield, MD  predniSONE (DELTASONE) 50 MG tablet Take 1 tablet by mouth 09/01/23 at 11 PM, take 1 tablet by mouth 09/02/23 at 5 AM, take 1 tablet by mouth AND benadryl 50 mg 09/02/23 at 10 AM 08/29/23   Tobb, Kardie, DO  sertraline (ZOLOFT) 25 MG tablet Take 25 mg by mouth in the morning. 08/01/23   [provider]  STOOL  SOFTENER 100 MG capsule Take 100 mg by mouth 2 (two) times daily. 06/16/23   [provider]  valsartan (DIOVAN) 320 MG tablet Take 320 mg by mouth in the morning. 08/01/23   [provider]  potassium chloride (KLOR-CON) 10 MEQ tablet Take 2 tablets (20 mEq total) by mouth daily. Patient not taking: Reported on 12/16/2022 06/09/20 12/26/22  Charlton Amor, PA-C     Critical care time: 43 min

## 2023-08-31 NOTE — ED Notes (Addendum)
I just spoke with one of her daughters and updated her. They may come visit sometime. She stated that since being on the meclizine and zofran she has been sleeping really hard.   EDP notified

## 2023-08-31 NOTE — ED Triage Notes (Signed)
Pt LKW 7:30 am.  Found unresponsive approx: 10:30am.  Pt has hx of CHF, COPD.  EMS endorses minimal lung sounds and some white sputum around her mouth.  Pt's pupils 2 PERRLA.  PT repsonds to pain.  164/94 100% (bagged), HR 62, Tachypneic, ETC02 65, CBG 150  EMS has no narcotics listed.  Pt recently started newly on  meclizine and zofran

## 2023-08-31 NOTE — Progress Notes (Signed)
RT transported from ED to CT and from CT to 43M 12 without complications

## 2023-08-31 NOTE — ED Notes (Signed)
ED TO INPATIENT HANDOFF REPORT  ED Nurse Name and Phone #: Lenell Antu Name/Age/Gender Sherry Fitzpatrick 72 y.o. female Room/Bed: RESUSC/RESUSC  Code Status   Code Status: Full Code  Home/SNF/Other Home Pt is somewhat responsive but still lethargic  and on bipap. Unable to determine orientation.  Is this baseline? No. Baseline is A&O x4 per family  Triage Complete: Triage complete  Chief Complaint Respiratory failure with hypoxia (HCC) [J96.91]  Triage Note Pt LKW 7:30 am.  Found unresponsive approx: 10:30am.  Pt has hx of CHF, COPD.  EMS endorses minimal lung sounds and some white sputum around her mouth.  Pt's pupils 2 PERRLA.  PT repsonds to pain.  164/94 100% (bagged), HR 62, Tachypneic, ETC02 65, CBG 150  EMS has no narcotics listed.  Pt recently started newly on  meclizine and zofran   Allergies Allergies  Allergen Reactions   Morphine And Codeine Hives and Nausea And Vomiting   Penicillins Hives    Has patient had a PCN reaction causing immediate rash, facial/tongue/throat swelling, SOB or lightheadedness with hypotension: Yes Has patient had a PCN reaction causing severe rash involving mucus membranes or skin necrosis: No Has patient had a PCN reaction that required hospitalization: No Has patient had a PCN reaction occurring within the last 10 years: No If all of the above answers are "NO", then may proceed with Cephalosporin use.   Percocet [Oxycodone-Acetaminophen] Nausea And Vomiting   Ace Inhibitors Nausea And Vomiting and Cough   Iodinated Contrast Media Nausea And Vomiting        Iodine Itching   Etodolac Nausea And Vomiting   Tramadol Nausea And Vomiting   Vicodin [Hydrocodone-Acetaminophen] Nausea And Vomiting    Level of Care/Admitting Diagnosis ED Disposition     ED Disposition  Admit   Condition  --   Comment  Hospital Area: [facility] [[id number]]  Level of Care: ICU [6]  May admit patient to [facility] or [facility] if  equivalent level of care is available:: No  Covid Evaluation: Asymptomatic - no recent exposure (last 10 days) testing not required  Diagnosis: Respiratory failure with hypoxia [facility]) [409811]  Admitting Physician: Lynnell Catalan [[id number]]  Attending Physician: Lynnell Catalan [phone]  Certification:: I certify this patient will need inpatient services for at least 2 midnights  Expected Medical Readiness: 09/06/2023          B Medical/Surgery History Past Medical History:  Diagnosis Date   Acute non-Q wave non-ST elevation myocardial infarction (NSTEMI) (HCC) 05/24/2020   Acute respiratory failure with hypoxia (HCC) 11/22/2019   AKI (acute kidney injury) (HCC) 07/03/2018   Alterations of sensations, late effect of cerebrovascular disease(438.6)    Arthritis    "all over"   Backache, unspecified    Body mass index 40.0-44.9, adult (HCC)    Carpal tunnel syndrome    Cerebral thrombosis with cerebral infarction 05/29/2020   Dizziness and giddiness    Esophageal reflux    Generalized pain    Gout attack 03/28/2020   Headache    "had alot til I had pituitary tumor removed"   Heart murmur    Hypertensive encephalopathy 03/28/2020   Mild cognitive impairment 07/16/2022   NSTEMI (non-ST elevated myocardial infarction) (HCC)    Other abnormal glucose    Other and unspecified hyperlipidemia    Other malaise and fatigue    Other postprocedural status(V45.89)    Other symptoms involving cardiovascular system    Shock (HCC)    Sleep apnea    "  did test; they wanted me to get a CPAP but I never did get it" (04/14/2015) Does use inhaler at night (01 2021)   Stroke (HCC) 06/2003; 02/2010   "minor; minor" ; denies residual on 04/14/2015   Unspecified disorder of the pituitary gland and its hypothalamic control    Unspecified essential hypertension    Unspecified visual disturbance    Past Surgical History:  Procedure Laterality Date   CARPAL TUNNEL RELEASE Left ~ 2014   CORONARY STENT  INTERVENTION N/A 05/24/2020   Procedure: CORONARY STENT INTERVENTION;  Surgeon: Corky Crafts, MD;  Location: [facility] INVASIVE CV LAB;  Service: Cardiovascular;  Laterality: N/A;   JOINT REPLACEMENT     LEFT HEART CATH AND CORONARY ANGIOGRAPHY N/A 05/24/2020   Procedure: LEFT HEART CATH AND CORONARY ANGIOGRAPHY;  Surgeon: Rinaldo Cloud, MD;  Location: [facility] INVASIVE CV LAB;  Service: Cardiovascular;  Laterality: N/A;   TOTAL HIP ARTHROPLASTY Right 11/06/2010   TRANSPHENOIDAL / TRANSNASAL HYPOPHYSECTOMY / RESECTION PITUITARY TUMOR  06/13/2012   TUBAL LIGATION  1974     A IV Location/Drains/Wounds Patient Lines/Drains/Airways Status     Active Line/Drains/Airways     Name Placement date Placement time Site Days   Peripheral IV 08/31/23 20 G Left;Posterior Hand 08/31/23  1129  Hand  less than 1   Peripheral IV 08/31/23 20 G Left;Medial Forearm 08/31/23  1145  Forearm  less than 1            Intake/Output Last 24 hours No intake or output data in the 24 hours ending 08/31/23 1446  Labs/Imaging Results for orders placed or performed during the hospital encounter of 08/31/23 (from the past 48 hour(s))  CBG monitoring, ED     Status: Abnormal   Collection Time: 08/31/23 11:27 AM  Result Value Ref Range   Glucose-Capillary 147 (H) 70 - 99 mg/dL    Comment: Glucose reference range applies only to samples taken after fasting for at least 8 hours.  Comprehensive metabolic panel     Status: Abnormal   Collection Time: 08/31/23 11:46 AM  Result Value Ref Range   Sodium 139 135 - 145 mmol/L   Potassium 5.4 (H) 3.5 - 5.1 mmol/L    Comment: HEMOLYSIS AT THIS LEVEL MAY AFFECT RESULT   Chloride 103 98 - 111 mmol/L   CO2 27 22 - 32 mmol/L   Glucose, Bld 138 (H) 70 - 99 mg/dL    Comment: Glucose reference range applies only to samples taken after fasting for at least 8 hours.   BUN 27 (H) 8 - 23 mg/dL   Creatinine, Ser 3.29 (H) 0.44 - 1.00 mg/dL   Calcium 8.7 (L) 8.9 - 10.3 mg/dL   Total  Protein 7.1 6.5 - 8.1 g/dL   Albumin 3.3 (L) 3.5 - 5.0 g/dL   AST 19 15 - 41 U/L    Comment: HEMOLYSIS AT THIS LEVEL MAY AFFECT RESULT   ALT 11 0 - 44 U/L    Comment: HEMOLYSIS AT THIS LEVEL MAY AFFECT RESULT   Alkaline Phosphatase 53 38 - 126 U/L   Total Bilirubin 0.8 <1.2 mg/dL    Comment: HEMOLYSIS AT THIS LEVEL MAY AFFECT RESULT   GFR, Estimated 25 (L) >60 mL/min    Comment: (NOTE) Calculated using the CKD-EPI Creatinine Equation (2021)    Anion gap 9 5 - 15    Comment: Performed at [facility], [address] [place], [place] [address]  CBC with Differential     Status:  Abnormal   Collection Time: 08/31/23 11:46 AM  Result Value Ref Range   WBC 8.0 4.0 - 10.5 K/uL   RBC 3.83 (L) 3.87 - 5.11 MIL/uL   Hemoglobin 11.1 (L) 12.0 - 15.0 g/dL   HCT 13.2 (L) 44.0 - 10.2 %   MCV 92.2 80.0 - 100.0 fL   MCH 29.0 26.0 - 34.0 pg   MCHC 31.4 30.0 - 36.0 g/dL   RDW 72.5 (H) 36.6 - 44.0 %   Platelets 269 150 - 400 K/uL   nRBC 0.0 0.0 - 0.2 %   Neutrophils Relative % 68 %   Neutro Abs 5.5 1.7 - 7.7 K/uL   Lymphocytes Relative 22 %   Lymphs Abs 1.7 0.7 - 4.0 K/uL   Monocytes Relative 8 %   Monocytes Absolute 0.6 0.1 - 1.0 K/uL   Eosinophils Relative 0 %   Eosinophils Absolute 0.0 0.0 - 0.5 K/uL   Basophils Relative 1 %   Basophils Absolute 0.1 0.0 - 0.1 K/uL   Immature Granulocytes 1 %   Abs Immature Granulocytes 0.06 0.00 - 0.07 K/uL    Comment: Performed at [facility], [address] [place], [place] [address]  Troponin I (High Sensitivity)     Status: Abnormal   Collection Time: 08/31/23 11:46 AM  Result Value Ref Range   Troponin I (High Sensitivity) 86 (H) <18 ng/L    Comment: (NOTE) Elevated high sensitivity troponin I (hsTnI) values and significant  changes across serial measurements may suggest ACS but many other  chronic and acute conditions are known to elevate hsTnI results.  Refer to the "Links" section for chest pain algorithms and additional   guidance. Performed at [facility], [address], [place], [place] [address]   Brain natriuretic peptide     Status: Abnormal   Collection Time: 08/31/23 11:46 AM  Result Value Ref Range   B Natriuretic Peptide 377.4 (H) 0.0 - 100.0 pg/mL    Comment: Performed at [facility], [address] [place], [place] [address]  I-Stat arterial blood gas, ED     Status: Abnormal   Collection Time: 08/31/23 11:51 AM  Result Value Ref Range   pH, Arterial 7.321 (L) 7.35 - 7.45   pCO2 arterial 57.7 (H) 32 - 48 mmHg   pO2, Arterial 246 (H) 83 - 108 mmHg   Bicarbonate 29.8 (H) 20.0 - 28.0 mmol/L   TCO2 32 22 - 32 mmol/L   O2 Saturation 100 %   Acid-Base Excess 2.0 0.0 - 2.0 mmol/L   Sodium 138 135 - 145 mmol/L   Potassium 4.5 3.5 - 5.1 mmol/L   Calcium, Ion 1.28 1.15 - 1.40 mmol/L   HCT 39.0 36.0 - 46.0 %   Hemoglobin 13.3 12.0 - 15.0 g/dL   Patient temperature 33.2 F    Collection site RADIAL, ALLEN'S TEST ACCEPTABLE    Drawn by RT    Sample type ARTERIAL   I-stat chem 8, ED     Status: Abnormal   Collection Time: 08/31/23 12:22 PM  Result Value Ref Range   Sodium 140 135 - 145 mmol/L   Potassium 4.9 3.5 - 5.1 mmol/L   Chloride 105 98 - 111 mmol/L   BUN 30 (H) 8 - 23 mg/dL   Creatinine, Ser 9.51 (H) 0.44 - 1.00 mg/dL   Glucose, Bld 884 (H) 70 - 99 mg/dL    Comment: Glucose reference range applies only to samples taken after fasting for at  least 8 hours.   Calcium, Ion 1.09 (L) 1.15 - 1.40 mmol/L   TCO2 29 22 - 32 mmol/L   Hemoglobin 12.6 12.0 - 15.0 g/dL   HCT 40.9 81.1 - 91.4 %  I-Stat Lactic Acid     Status: None   Collection Time: 08/31/23 12:23 PM  Result Value Ref Range   Lactic Acid, Venous 1.4 0.5 - 1.9 mmol/L   DG Chest Port 1 View  Result Date: 08/31/2023 CLINICAL DATA:  Found unresponsive EXAM: PORTABLE CHEST 1 VIEW COMPARISON:  07/05/2023 FINDINGS: Cardiomegaly with coronary stenting, stable. Interstitial coarsening also seen on prior, likely vascular  congestion. No Kerley lines, effusion, air bronchogram, or pneumothorax. IMPRESSION: Cardiomegaly and vascular congestion. No change when compared to prior. Electronically Signed   By: Tiburcio Pea M.D.   On: 08/31/2023 12:22    Pending Labs Unresulted Labs (From admission, onward)     Start     Ordered   09/01/23 0500  CBC  Tomorrow morning,   R        08/31/23 1342   09/01/23 0500  Basic metabolic panel  Tomorrow morning,   R        08/31/23 1342   09/01/23 0500  Brain natriuretic peptide  Tomorrow morning,   R        08/31/23 1419   08/31/23 1421  Salicylate level  Once,   R        08/31/23 1420   08/31/23 1421  Rapid urine drug screen (hospital performed)  ONCE - STAT,   STAT        08/31/23 1420   08/31/23 1420  Procalcitonin  Daily,   R     References:    Procalcitonin Lower Respiratory Tract Infection AND Sepsis Procalcitonin Algorithm   08/31/23 1419   08/31/23 1420  TSH  Once,   R        08/31/23 1420   08/31/23 1420  Ammonia  Once,   R        08/31/23 1420   08/31/23 1418  Acetaminophen level  Once,   R        08/31/23 1419   08/31/23 1243  Urinalysis, w/ Reflex to Culture (Infection Suspected) -Urine, Catheterized  Once,   R       Question:  Specimen Source  Answer:  Urine, Catheterized   08/31/23 1343   08/31/23 1140  Culture, blood (routine x 2)  BLOOD CULTURE X 2,   R (with STAT occurrences)      08/31/23 1139            Vitals/Pain Today's Vitals   08/31/23 1351 08/31/23 1400 08/31/23 1415 08/31/23 1430  BP:  139/81 (!) 148/80 (!) 142/72  Pulse:  (!) 58 (!) 56 (!) 57  Resp:  16 18 15   Temp:      TempSrc:      SpO2: 100% 100% 100% 100%    Isolation Precautions No active isolations  Medications Medications  furosemide (LASIX) injection 40 mg (has no administration in time range)  docusate sodium (COLACE) capsule 100 mg (has no administration in time range)  polyethylene glycol (MIRALAX / GLYCOLAX) packet 17 g (has no administration in time range)   naloxone [facility]) injection 2 mg (2 mg Nasal Given 08/31/23 1144)    Mobility      Focused Assessments Pulmonary Assessment Handoff:  Lung sounds: Bilateral Breath Sounds: Diminished L Breath Sounds: Diminished R Breath Sounds: Diminished  R Recommendations: See Admitting Provider Note  Report given to:   Additional Notes:

## 2023-08-31 NOTE — Progress Notes (Signed)
LTM EEG hooked up and running - no initial skin breakdown - push button tested - [facility] is NOT monitoring-technical difficulties.

## 2023-08-31 NOTE — ED Provider Notes (Signed)
[place] EMERGENCY DEPARTMENT AT [facility] Provider Note   CSN: [id number] Arrival date & time: 08/31/23  1123     History  No chief complaint on file.   Sherry Fitzpatrick is a 72 y.o. female.  72 year old female with prior medical history as detailed below presents from home with EMS transport.  Patient was reportedly last known normal sometime around 7:30 AM.  Shortly before EMS was called family could not wake patient from sleep while she was on the couch in her residence.  EMS noted significant obtundation.  Patient would respond to painful stimulation in all 4 extremities.  Patient without vomiting.  EMS assisted ventilations with BVM during transport.  On arrival to the ED, patient is somnolent but arousable to painful stimulation.  She moves all 4 extremities.  With deep painful stimulation (sternal rub) patient says loudly and clearly" [name] stop it".  BG is within normal limits.  2 mg Narcan given intranasally on arrival to the ED without significant noted improvement.  Given body habitus and reported prior medical history concern for hypercarbia.  Patient placed on BiPAP immediately.  The history is provided by the patient and medical records.       Home Medications Prior to Admission medications   Medication Sig Start Date End Date Taking? Authorizing Provider  acetaminophen (TYLENOL) 325 MG tablet Take 2 tablets (650 mg total) by mouth every 6 (six) hours as needed (Mild pain). Patient taking differently: Take 650 mg by mouth as needed for mild pain (pain score 1-3) or moderate pain (pain score 4-6). 06/09/20   Angiulli, Mcarthur Rossetti, PA-C  albuterol (VENTOLIN HFA) 108 (90 Base) MCG/ACT inhaler Inhale 2 puffs into the lungs every 6 (six) hours as needed for wheezing. 12/26/22   Pokhrel, Rebekah Chesterfield, MD  amiodarone (PACERONE) 200 MG tablet Take 100 mg by mouth in the morning.    [provider]  amLODipine (NORVASC) 5 MG tablet Take 5 mg by mouth in the  morning. 08/01/23   [provider]  aspirin 81 MG chewable tablet Chew 81 mg by mouth in the morning.    [provider]  atorvastatin (LIPITOR) 40 MG tablet Take 40 mg by mouth at bedtime. 08/01/23   [provider]  budesonide-formoterol (SYMBICORT) 160-4.5 MCG/ACT inhaler Inhale 2 puffs into the lungs 2 (two) times daily. Patient taking differently: Inhale 2 puffs into the lungs in the morning. 07/09/23   Glade Lloyd, MD  carvedilol (COREG) 6.25 MG tablet Take 1 tablet (6.25 mg total) by mouth 2 (two) times daily. 12/26/22 06/30/24  Pokhrel, Rebekah Chesterfield, MD  clopidogrel (PLAVIX) 75 MG tablet Take 1 tablet (75 mg total) by mouth daily. 06/09/20   Angiulli, Mcarthur Rossetti, PA-C  ferrous sulfate 325 (65 FE) MG EC tablet Take 325 mg by mouth in the morning.    [provider]  furosemide (LASIX) 40 MG tablet Take 1 tablet (40 mg total) by mouth daily at 2 PM. Patient taking differently: Take 40 mg by mouth in the morning. 12/26/22 06/30/24  Pokhrel, Rebekah Chesterfield, MD  hydrALAZINE (APRESOLINE) 25 MG tablet Take 1 tablet (25 mg total) by mouth every 8 (eight) hours. 12/26/22 06/30/24  Pokhrel, Rebekah Chesterfield, MD  ipratropium-albuterol (DUONEB) 0.5-2.5 (3) MG/3ML SOLN Take 3 mLs by nebulization every 6 (six) hours as needed (wheezing/shortness of breath). 07/09/23   Glade Lloyd, MD  isosorbide mononitrate (IMDUR) 120 MG 24 hr tablet Take 120 mg by mouth in the morning.    [provider]  meclizine (ANTIVERT) 25 MG tablet Take 25 mg by mouth 2 (two) times daily as needed (vertigo). 08/27/23   [provider]  nitroGLYCERIN (NITROSTAT) 0.4 MG SL tablet Place 1 tablet (0.4 mg total) under the tongue every 5 (five) minutes x 3 doses as needed for chest pain. 05/25/20   Rinaldo Cloud, MD  ondansetron (ZOFRAN) 8 MG tablet Take 8 mg by mouth 2 (two) times daily as needed for nausea or vomiting (vertigo). 08/27/23   [provider]  pantoprazole (PROTONIX) 40 MG tablet Take 1  tablet (40 mg total) by mouth daily. 07/09/23   Glade Lloyd, MD  polyethylene glycol (MIRALAX / GLYCOLAX) 17 g packet Take 17 g by mouth daily as needed for moderate constipation. 07/09/23   Glade Lloyd, MD  potassium chloride SA (KLOR-CON M) 20 MEQ tablet Take 1 tablet (20 mEq total) by mouth daily. 12/26/22   Pokhrel, Rebekah Chesterfield, MD  predniSONE (DELTASONE) 50 MG tablet Take 1 tablet by mouth 09/01/23 at 11 PM, take 1 tablet by mouth 09/02/23 at 5 AM, take 1 tablet by mouth AND benadryl 50 mg 09/02/23 at 10 AM 08/29/23   Tobb, Kardie, DO  sertraline (ZOLOFT) 25 MG tablet Take 25 mg by mouth in the morning. 08/01/23   [provider]  STOOL SOFTENER 100 MG capsule Take 100 mg by mouth 2 (two) times daily. 06/16/23   [provider]  valsartan (DIOVAN) 320 MG tablet Take 320 mg by mouth in the morning. 08/01/23   [provider]  potassium chloride (KLOR-CON) 10 MEQ tablet Take 2 tablets (20 mEq total) by mouth daily. Patient not taking: Reported on 12/16/2022 06/09/20 12/26/22  Angiulli, Mcarthur Rossetti, PA-C      Allergies    Morphine and codeine, Penicillins, Percocet [oxycodone-acetaminophen], Ace inhibitors, Iodinated contrast media, Iodine, Etodolac, Tramadol, and Vicodin [hydrocodone-acetaminophen]    Review of Systems   Review of Systems  Unable to perform ROS: Acuity of condition    Physical Exam Updated Vital Signs BP (!) 121/53   Pulse 60   Temp 97.9 F (36.6 C) (Axillary)   Resp 19   SpO2 100%  Physical Exam Vitals and nursing note reviewed.  Constitutional:      General: She is not in acute distress.    Appearance: She is well-developed.     Comments: Obtunded, no response to verbal stimulation, with painful stimulation patient moves all 4 extremities, with painful stimulation the patient cries out loudly and clearly "[name] stop it"   HENT:     Head: Normocephalic and atraumatic.  Eyes:     Conjunctiva/sclera: Conjunctivae normal.     Pupils: Pupils are  equal, round, and reactive to light.  Cardiovascular:     Rate and Rhythm: Normal rate and regular rhythm.     Heart sounds: Normal heart sounds.  Pulmonary:     Effort: Pulmonary effort is normal. No respiratory distress.     Breath sounds: Normal breath sounds.  Abdominal:     General: There is no distension.     Palpations: Abdomen is soft.     Tenderness: There is no abdominal tenderness.  Musculoskeletal:        General: No deformity. Normal range of motion.     Cervical back: Normal range of motion and neck supple.  Skin:    General: Skin is warm and dry.     ED Results / Procedures / Treatments   Labs (all labs ordered are listed, but only abnormal results are displayed)  Labs Reviewed  CBC WITH DIFFERENTIAL/PLATELET - Abnormal; Notable for the following components:      Result Value   RBC 3.83 (*)    Hemoglobin 11.1 (*)    HCT 35.3 (*)    RDW 16.8 (*)    All other components within normal limits  CBG MONITORING, ED - Abnormal; Notable for the following components:   Glucose-Capillary 147 (*)    All other components within normal limits  I-STAT ARTERIAL BLOOD GAS, ED - Abnormal; Notable for the following components:   pH, Arterial 7.321 (*)    pCO2 arterial 57.7 (*)    pO2, Arterial 246 (*)    Bicarbonate 29.8 (*)    All other components within normal limits  CULTURE, BLOOD (ROUTINE X 2)  CULTURE, BLOOD (ROUTINE X 2)  COMPREHENSIVE METABOLIC PANEL  BRAIN NATRIURETIC PEPTIDE  I-STAT CHEM 8, ED  CBG MONITORING, ED  I-STAT CG4 LACTIC ACID, ED  TROPONIN I (HIGH SENSITIVITY)    EKG EKG Interpretation Date/Time:  Saturday August 31 2023 11:36:16 EST Ventricular Rate:  64 PR Interval:    QRS Duration:  122 QT Interval:  466 QTC Calculation: 481 R Axis:   85  Text Interpretation: poor baseline Nonspecific intraventricular conduction delay Consider anterolateral infarct ST depr, consider ischemia, inferior leads Confirmed by Kristine Royal [phone]) on  08/31/2023 11:41:09 AM  Radiology No results found.  Procedures Procedures    Medications Ordered in ED Medications  naloxone (NARCAN) injection 2 mg (2 mg Nasal Given 08/31/23 1144)    ED Course/ Medical Decision Making/ A&P                                 Medical Decision Making Amount and/or Complexity of Data Reviewed Labs: ordered. Radiology: ordered.  Risk Prescription drug management. Decision regarding hospitalization.    Medical Screen Complete  This patient presented to the ED with complaint of AMS.  This complaint involves an extensive number of treatment options. The initial differential diagnosis includes, but is not limited to, hypercarbia, uremia, encephalopathy, metabolic abnormality, etc.  This presentation is: Acute, Chronic, Self-Limited, Previously Undiagnosed, Uncertain Prognosis, Complicated, Systemic Symptoms, and Threat to Life/Bodily Function  Patient is presenting with obtundation and somnolence.  Patient placed on BiPAP on arrival.  Presentation initially felt to be most likely secondary to hypercarbia, uremia, other metabolic process.  Patient improved with BiPAP.  Mentation improved significantly over the first half an hour of BiPAP use.  Initially, patient did not respond to verbal stimulation.  Shortly thereafter patient was able to open her eyes with verbal questioning and answer short questions appropriately.  Patient would benefit from admission.  Critical care service made aware of case and evaluate for same.   Additional history obtained:  Additional history obtained from EMS External records from outside sources obtained and reviewed including prior ED visits and prior Inpatient records.   Problem List / ED Course:  AMS   Reevaluation:  After the interventions noted above, I reevaluated the patient and found that they have: improved   Disposition:  After consideration of the diagnostic results and the patients  response to treatment, I feel that the patent would benefit from admission.  CRITICAL CARE Performed by: Wynetta Fines   Total critical care time: 30 minutes  Critical care time was exclusive of separately billable procedures and treating other patients.  Critical care was necessary to treat or prevent imminent or life-threatening  deterioration.  Critical care was time spent personally by me on the following activities: development of treatment plan with patient and/or surrogate as well as nursing, discussions with consultants, evaluation of patient's response to treatment, examination of patient, obtaining history from patient or surrogate, ordering and performing treatments and interventions, ordering and review of laboratory studies, ordering and review of radiographic studies, pulse oximetry and re-evaluation of patient's condition.           Final Clinical Impression(s) / ED Diagnoses Final diagnoses:  Altered mental status, unspecified altered mental status type    Rx / DC Orders ED Discharge Orders     None         Wynetta Fines, MD 08/31/23 1542

## 2023-08-31 NOTE — Consult Note (Signed)
NEURO HOSPITALIST CONSULT NOTE   Requestig physician: Dr. Denese Killings  Reason for Consult: Right sided weakness  History obtained from:  Daughter and Chart     HPI:                                                                                                                                          Sherry Fitzpatrick is an 72 y.o. female with an extensive PMHx including CAD s/p stenting, atrial fibrillation (not on anticoagulation), CKD3, COPD, prior acute hypoxic respiratory failure, CHF exacerbation, AKI, strokes in 2004, 2011 and 2021, morbid obesity, gout, hypertensive encephalopathy, mild cognitive impairment, HLD and sleep apnea who presented to the ED this morning after she was found unresponsive by family at 10:30 AM; she appeared to be asleep, but family could not wake her. Family had last seen her normal at 0730.  EMS exam revealed minimal lung sounds and some white sputum around her mouth. Pupils wer reactive and she responded to pain. CBG was 150. On arrival to the ED she was being bagged with O2 sat of 100%. She had recently been started on meclizine and Zofran. On initial ED exam, with deep painful stimulation (sternal rub) patient would not open eyes, but would loudly and clearly say " Sherry Fitzpatrick stop it". Narcan did not improve her mentation. She had mild hypercarbia on ABG. She was started on BIPAP and her mentation improved significantly over the first 30 minutes of BIPAP, subsequently being able to open her eyes to verbal questioning and answer short questions appropriately. CT head showed chronic microvascular disease but no acute intracranial process.   On exam by CCM, the patient seemed to be moving her right arm less than her left. Neurology was consulted for further evaluation.   Home medications include ASA and Plavix. She was recently started on meclizine and zofran for symptoms of vertigo. Daughter states that after taking these meds, she became drowsy to  somnolent.   Past Medical History:  Diagnosis Date   Acute non-Q wave non-ST elevation myocardial infarction (NSTEMI) (HCC) 05/24/2020   Acute respiratory failure with hypoxia (HCC) 11/22/2019   AKI (acute kidney injury) (HCC) 07/03/2018   Alterations of sensations, late effect of cerebrovascular disease(438.6)    Arthritis    "all over"   Backache, unspecified    Body mass index 40.0-44.9, adult (HCC)    Carpal tunnel syndrome    Cerebral thrombosis with cerebral infarction 05/29/2020   Dizziness and giddiness    Esophageal reflux    Generalized pain    Gout attack 03/28/2020   Headache    "had alot til I had pituitary tumor removed"   Heart murmur    Hypertensive encephalopathy 03/28/2020   Mild cognitive impairment 07/16/2022   NSTEMI (non-ST elevated myocardial infarction) (  HCC)    Other abnormal glucose    Other and unspecified hyperlipidemia    Other malaise and fatigue    Other postprocedural status(V45.89)    Other symptoms involving cardiovascular system    Shock (HCC)    Sleep apnea    "did test; they wanted me to get a CPAP but I never did get it" (04/14/2015) Does use inhaler at night (01 2021)   Stroke (HCC) 06/2003; 02/2010   "minor; minor" ; denies residual on 04/14/2015   Unspecified disorder of the pituitary gland and its hypothalamic control    Unspecified essential hypertension    Unspecified visual disturbance     Past Surgical History:  Procedure Laterality Date   CARPAL TUNNEL RELEASE Left ~ 2014   CORONARY STENT INTERVENTION N/A 05/24/2020   Procedure: CORONARY STENT INTERVENTION;  Surgeon: Corky Crafts, MD;  Location: [facility];  Service: Cardiovascular;  Laterality: N/A;   JOINT REPLACEMENT     LEFT HEART CATH AND CORONARY ANGIOGRAPHY N/A 05/24/2020   Procedure: LEFT HEART CATH AND CORONARY ANGIOGRAPHY;  Surgeon: Rinaldo Cloud, MD;  Location: [facility];  Service: Cardiovascular;  Laterality: N/A;   TOTAL HIP ARTHROPLASTY  Right 11/06/2010   TRANSPHENOIDAL / TRANSNASAL HYPOPHYSECTOMY / RESECTION PITUITARY TUMOR  06/13/2012   TUBAL LIGATION  1974    Family History  Problem Relation Age of Onset   Heart failure Mother    Hypertension Mother    Diabetes Father    Hypertension Sister    Heart failure Sister    Hypertension Daughter             Social History:  reports that she has never smoked. She has never used smokeless tobacco. She reports current alcohol use. She reports that she does not use drugs.  Allergies  Allergen Reactions   Morphine And Codeine Hives and Nausea And Vomiting   Penicillins Hives    Has patient had a PCN reaction causing immediate rash, facial/tongue/throat swelling, SOB or lightheadedness with hypotension: Yes Has patient had a PCN reaction causing severe rash involving mucus membranes or skin necrosis: No Has patient had a PCN reaction that required hospitalization: No Has patient had a PCN reaction occurring within the last 10 years: No If all of the above answers are "NO", then may proceed with Cephalosporin use.   Percocet [Oxycodone-Acetaminophen] Nausea And Vomiting   Ace Inhibitors Nausea And Vomiting and Cough   Iodinated Contrast Media Nausea And Vomiting        Iodine Itching   Etodolac Nausea And Vomiting   Tramadol Nausea And Vomiting   Vicodin [Hydrocodone-Acetaminophen] Nausea And Vomiting    MEDICATIONS:                                                                                                                     No current facility-administered medications on file prior to encounter.   Current Outpatient Medications on File Prior to Encounter  Medication Sig Dispense Refill  acetaminophen (TYLENOL) 325 MG tablet Take 2 tablets (650 mg total) by mouth every 6 (six) hours as needed (Mild pain). (Patient taking differently: Take 650 mg by mouth as needed for mild pain (pain score 1-3) or moderate pain (pain score 4-6).)     albuterol (VENTOLIN HFA) 108  (90 Base) MCG/ACT inhaler Inhale 2 puffs into the lungs every 6 (six) hours as needed for wheezing. 6.7 g 0   amiodarone (PACERONE) 200 MG tablet Take 100 mg by mouth in the morning.     amLODipine (NORVASC) 5 MG tablet Take 5 mg by mouth in the morning.     aspirin 81 MG chewable tablet Chew 81 mg by mouth in the morning.     atorvastatin (LIPITOR) 40 MG tablet Take 40 mg by mouth at bedtime.     budesonide-formoterol (SYMBICORT) 160-4.5 MCG/ACT inhaler Inhale 2 puffs into the lungs 2 (two) times daily. (Patient taking differently: Inhale 2 puffs into the lungs in the morning.) 6 g 0   carvedilol (COREG) 6.25 MG tablet Take 1 tablet (6.25 mg total) by mouth 2 (two) times daily. 60 tablet 2   clopidogrel (PLAVIX) 75 MG tablet Take 1 tablet (75 mg total) by mouth daily. 30 tablet 3   ferrous sulfate 325 (65 FE) MG EC tablet Take 325 mg by mouth in the morning.     furosemide (LASIX) 40 MG tablet Take 1 tablet (40 mg total) by mouth daily at 2 PM. (Patient taking differently: Take 40 mg by mouth in the morning.) 30 tablet 2   hydrALAZINE (APRESOLINE) 25 MG tablet Take 1 tablet (25 mg total) by mouth every 8 (eight) hours. 90 tablet 2   ipratropium-albuterol (DUONEB) 0.5-2.5 (3) MG/3ML SOLN Take 3 mLs by nebulization every 6 (six) hours as needed (wheezing/shortness of breath). 360 mL 0   isosorbide mononitrate (IMDUR) 120 MG 24 hr tablet Take 120 mg by mouth in the morning.     meclizine (ANTIVERT) 25 MG tablet Take 25 mg by mouth 2 (two) times daily as needed (vertigo).     nitroGLYCERIN (NITROSTAT) 0.4 MG SL tablet Place 1 tablet (0.4 mg total) under the tongue every 5 (five) minutes x 3 doses as needed for chest pain. 25 tablet 12   ondansetron (ZOFRAN) 8 MG tablet Take 8 mg by mouth 2 (two) times daily as needed for nausea or vomiting (vertigo).     pantoprazole (PROTONIX) 40 MG tablet Take 1 tablet (40 mg total) by mouth daily.     polyethylene glycol (MIRALAX / GLYCOLAX) 17 g packet Take 17 g by  mouth daily as needed for moderate constipation.     potassium chloride SA (KLOR-CON M) 20 MEQ tablet Take 1 tablet (20 mEq total) by mouth daily. 30 tablet 1   predniSONE (DELTASONE) 50 MG tablet Take 1 tablet by mouth 09/01/23 at 11 PM, take 1 tablet by mouth 09/02/23 at 5 AM, take 1 tablet by mouth AND benadryl 50 mg 09/02/23 at 10 AM 3 tablet 0   sertraline (ZOLOFT) 25 MG tablet Take 25 mg by mouth in the morning.     STOOL SOFTENER 100 MG capsule Take 100 mg by mouth 2 (two) times daily.     valsartan (DIOVAN) 320 MG tablet Take 320 mg by mouth in the morning.     [DISCONTINUED] potassium chloride (KLOR-CON) 10 MEQ tablet Take 2 tablets (20 mEq total) by mouth daily. (Patient not taking: Reported on 12/16/2022) 60 tablet 1    Scheduled:  furosemide  40 mg Intravenous Once   Continuous:   ROS:                                                                                                                                       Unable to obtain due to lethargy.    Blood pressure (!) 121/53, pulse 60, temperature 97.9 F (36.6 C), temperature source Axillary, resp. rate 19, SpO2 100%.   General Examination:                                                                                                       Physical Exam  HEENT:  [place]/AT Lungs: BIPAP in place Extremities: Warm and well perfused.   Neurological Examination Mental Status: Severely somnolent. Will not open eyes to voice or stimulation, but will moan and forcibly close them after examiner lifts lids. Does not follow commands or answer questions. Will localize with LUE to noxious stimuli, while moaning. Will also swat BUE, right less than left, to sternal rub. No vocal output except for moaning to noxious.  Cranial Nerves: II: No blink to threat. PERRL 3 >> 2 mm bilateraly.   III,IV, VI: Keeps eyes closed throughout the exam. When lids are opened, eyes are conjugate at the midline with no forced gaze deviation or nystagmus.  Does not look towards or away from visual stimuli, but closes eyes forcibly to penlight.   V: Reacts to eyelid stimulation bilaterally VII: Flaccidly symmetric with BIPAP mask on.  VIII: No responses to voice IX,X: Gag reflex deferred XI: Head is midline XII: Unable to assess Motor: RUE: With passive elevation and release, arm drops immediately to bed. Will lift antigravity and flail weakly to noxious arm pinch. Also will move RUE towards chest with sternal rub.  LUE: With passive elevation and release, arm drifts slowly to bed. Will lift antigravity and flail vigorously to noxious arm pinch. Also will move RUE vigorously towards chest with sternal rub, with swatting movements. RUE with weakness relative to left, as above. Not following commands for formal motor testing BLE with weak ankle dorsiflexion and knee flexion in response to noxious plantar stimulation, right moves slightly less briskly and with slightly lower amplitude than on the left.   Occasional subtle low amplitude high frequency tremor of proximal upper extremities is noted.  Sensory: Reacts to noxious in all 4 extremities without asymmetry when correlating the location of the stimulus with the overall vigor of her responses.  Deep Tendon Reflexes:  2+ left brachioradialis and biceps, 1+ right brachioradialis and biceps, 0 right patellar, 2+ left patellar, 0 bilateral achilles. Toes equivocal.  Cerebellar: Unable to assess Gait: Unable to assess    Lab Results: Basic Metabolic Panel: Recent Labs  Lab 08/28/23 1006 08/31/23 1146 08/31/23 1151 08/31/23 1222  NA 139 139 138 140  K 4.2 5.4* 4.5 4.9  CL 101 103  --  105  CO2 22 27  --   --   GLUCOSE 142* 138*  --  135*  BUN 18 27*  --  30*  CREATININE 1.29* 2.07*  --  2.00*  CALCIUM 9.2 8.7*  --   --   MG 2.1  --   --   --     CBC: Recent Labs  Lab 08/28/23 1006 08/31/23 1146 08/31/23 1151 08/31/23 1222  WBC 5.8 8.0  --   --   NEUTROABS 2.9 5.5  --   --   HGB  12.1 11.1* 13.3 12.6  HCT 37.3 35.3* 39.0 37.0  MCV 88 92.2  --   --   PLT 294 269  --   --     Cardiac Enzymes: No results for input(s): "CKTOTAL", "CKMB", "CKMBINDEX", "TROPONINI" in the last 168 hours.  Lipid Panel: Recent Labs  Lab 08/28/23 1006  CHOL 197  TRIG 178*  HDL 34*  CHOLHDL 5.8*  LDLCALC 131*    Imaging: DG Chest Port 1 View  Result Date: 08/31/2023 CLINICAL DATA:  Found unresponsive EXAM: PORTABLE CHEST 1 VIEW COMPARISON:  07/05/2023 FINDINGS: Cardiomegaly with coronary stenting, stable. Interstitial coarsening also seen on prior, likely vascular congestion. No Kerley lines, effusion, air bronchogram, or pneumothorax. IMPRESSION: Cardiomegaly and vascular congestion. No change when compared to prior. Electronically Signed   By: Tiburcio Pea M.D.   On: 08/31/2023 12:22     Assessment: 72 year old female with an extensive PMHx presenting with AMS, thought to be due to a metabolic encephalopathy in the setting of hypercarbia. CCM noticed possible right arm weakness on exam. Neurology was consulted to further evaluate.  - Exam reveals a severely somnolent patient with findings on motor exam that are suggestive of possible UMN deficit affecting the RUE and RLE. Also noted is occasional subtle low amplitude high frequency tremor of proximal upper extremities.  - DDx includes small left hemispheric acute lacunar infarction versus subclinical seizure.  - CT head: No evidence of acute intracranial abnormality. Moderate chronic small vessel ischemic disease. - Labs: - Ammonia level is normal. AST and ALT are normal.  - BUN elevated at 30. Cr elevated at 2 - Glucose 135 - Troponin I elevated at 183 - TSH elevated at 4.5.  - Lactate and procalcitonin are normal. WBC normal.  - Her acute encephalopathy is felt to be due in part to hypercarbia in the setting of underlying obstructive lung disease, but sedating drugs and hypothyroidism are also possible.    Recommendations: - MRI brain (ordered) - EEG (ordered) - Complete her metabolic encephalopathy work up - Discontinue Zofran and meclizine. Although these meds should not be expected generally to causes severe AMS, given the daughter's story of significant AMS onset shortly after taking them, a possible sensitivity to these meds or a paradoxical reaction are on the DDx.  - Further recommendations pending MRI and EEG.   Addendum: - MRI brain reveals a single punctate focus of restricted diffusion involving the right lentiform nucleus, suspicious for a tiny acute ischemic infarct. No associated hemorrhage. Underlying moderately advanced chronic microvascular  ischemic disease with a few scattered remote infarcts as above. - EEG is pending     Electronically signed: Dr. Caryl Pina 08/31/2023, 2:01 PM

## 2023-08-31 NOTE — Progress Notes (Signed)
EEG tech notified Patient has returned from MRI, leads need to be reconnected.  Tech is aware.

## 2023-09-01 ENCOUNTER — Inpatient Hospital Stay (HOSPITAL_COMMUNITY): Payer: 59

## 2023-09-01 DIAGNOSIS — I639 Cerebral infarction, unspecified: Secondary | ICD-10-CM

## 2023-09-01 DIAGNOSIS — R569 Unspecified convulsions: Secondary | ICD-10-CM

## 2023-09-01 DIAGNOSIS — R0600 Dyspnea, unspecified: Secondary | ICD-10-CM | POA: Diagnosis not present

## 2023-09-01 DIAGNOSIS — R4182 Altered mental status, unspecified: Secondary | ICD-10-CM | POA: Diagnosis not present

## 2023-09-01 DIAGNOSIS — G9341 Metabolic encephalopathy: Secondary | ICD-10-CM | POA: Diagnosis not present

## 2023-09-01 LAB — URINALYSIS, W/ REFLEX TO CULTURE (INFECTION SUSPECTED)
Bilirubin Urine: NEGATIVE
Glucose, UA: NEGATIVE mg/dL
Ketones, ur: NEGATIVE mg/dL
Nitrite: NEGATIVE
Protein, ur: NEGATIVE mg/dL
Specific Gravity, Urine: 1.019 (ref 1.005–1.030)
pH: 5 (ref 5.0–8.0)

## 2023-09-01 LAB — BASIC METABOLIC PANEL
Anion gap: 8 (ref 5–15)
BUN: 28 mg/dL — ABNORMAL HIGH (ref 8–23)
CO2: 29 mmol/L (ref 22–32)
Calcium: 8.8 mg/dL — ABNORMAL LOW (ref 8.9–10.3)
Chloride: 104 mmol/L (ref 98–111)
Creatinine, Ser: 1.9 mg/dL — ABNORMAL HIGH (ref 0.44–1.00)
GFR, Estimated: 27 mL/min — ABNORMAL LOW (ref >60)
Glucose, Bld: 97 mg/dL (ref 70–99)
Potassium: 4 mmol/L (ref 3.5–5.1)
Sodium: 141 mmol/L (ref 135–145)

## 2023-09-01 LAB — CBC
HCT: 34 % — ABNORMAL LOW (ref 36.0–46.0)
Hemoglobin: 10.8 g/dL — ABNORMAL LOW (ref 12.0–15.0)
MCH: 28.6 pg (ref 26.0–34.0)
MCHC: 31.8 g/dL (ref 30.0–36.0)
MCV: 90.2 fL (ref 80.0–100.0)
Platelets: 228 10*3/uL (ref 150–400)
RBC: 3.77 MIL/uL — ABNORMAL LOW (ref 3.87–5.11)
RDW: 16.6 % — ABNORMAL HIGH (ref 11.5–15.5)
WBC: 6.6 10*3/uL (ref 4.0–10.5)
nRBC: 0 % (ref 0.0–0.2)

## 2023-09-01 LAB — GLUCOSE, CAPILLARY
Glucose-Capillary: 85 mg/dL (ref 70–99)
Glucose-Capillary: 92 mg/dL (ref 70–99)
Glucose-Capillary: 90 mg/dL (ref 70–99)

## 2023-09-01 LAB — RAPID URINE DRUG SCREEN, HOSP PERFORMED
Amphetamines: NOT DETECTED
Barbiturates: NOT DETECTED
Benzodiazepines: NOT DETECTED
Cocaine: NOT DETECTED
Opiates: NOT DETECTED
Tetrahydrocannabinol: NOT DETECTED

## 2023-09-01 LAB — PROCALCITONIN: Procalcitonin: <0.1 ng/mL

## 2023-09-01 LAB — BRAIN NATRIURETIC PEPTIDE: B Natriuretic Peptide: 275.5 pg/mL — ABNORMAL HIGH (ref 0.0–100.0)

## 2023-09-01 MED ORDER — ARFORMOTEROL TARTRATE 15 MCG/2ML IN NEBU
15.0000 ug | INHALATION_SOLUTION | Freq: Two times a day (BID) | RESPIRATORY_TRACT | Status: DC
  Administered 2023-09-01 – 2023-09-04 (×7): 15 ug via RESPIRATORY_TRACT
  Filled 2023-09-01 (×7): qty 2

## 2023-09-01 MED ORDER — AMLODIPINE BESYLATE 5 MG PO TABS
5.0000 mg | ORAL_TABLET | Freq: Every day | ORAL | Status: DC
Start: 2023-09-01 — End: 2023-09-05
  Administered 2023-09-01 – 2023-09-04 (×4): 5 mg via ORAL
  Filled 2023-09-01 (×4): qty 1

## 2023-09-01 MED ORDER — PANTOPRAZOLE SODIUM 40 MG PO TBEC
40.0000 mg | DELAYED_RELEASE_TABLET | Freq: Every day | ORAL | Status: DC
  Administered 2023-09-01 – 2023-09-05 (×5): 40 mg via ORAL
  Filled 2023-09-01 (×4): qty 1

## 2023-09-01 MED ORDER — FUROSEMIDE 10 MG/ML IJ SOLN
40.0000 mg | Freq: Once | INTRAMUSCULAR | Status: AC
  Administered 2023-09-01: 40 mg via INTRAVENOUS
  Filled 2023-09-01: qty 4

## 2023-09-01 MED ORDER — AMIODARONE HCL 200 MG PO TABS
200.0000 mg | ORAL_TABLET | Freq: Every day | ORAL | Status: DC
  Administered 2023-09-01 – 2023-09-05 (×5): 200 mg via ORAL
  Filled 2023-09-01 (×5): qty 1

## 2023-09-01 MED ORDER — STROKE: EARLY STAGES OF RECOVERY BOOK
Freq: Once | Status: AC
Start: 2023-09-02 — End: 2023-09-02
  Filled 2023-09-01: qty 1

## 2023-09-01 MED ORDER — CLOPIDOGREL BISULFATE 75 MG PO TABS
75.0000 mg | ORAL_TABLET | Freq: Every day | ORAL | Status: DC
  Administered 2023-09-01 – 2023-09-02 (×2): 75 mg via ORAL
  Filled 2023-09-01 (×2): qty 1

## 2023-09-01 MED ORDER — HYDRALAZINE HCL 25 MG PO TABS
25.0000 mg | ORAL_TABLET | Freq: Three times a day (TID) | ORAL | Status: DC
  Administered 2023-09-01 – 2023-09-05 (×12): 25 mg via ORAL
  Filled 2023-09-01 (×12): qty 1

## 2023-09-01 MED ORDER — POTASSIUM CHLORIDE CRYS ER 20 MEQ PO TBCR
40.0000 meq | EXTENDED_RELEASE_TABLET | Freq: Once | ORAL | Status: AC
  Administered 2023-09-01: 40 meq via ORAL
  Filled 2023-09-01: qty 2

## 2023-09-01 MED ORDER — ATORVASTATIN CALCIUM 40 MG PO TABS
40.0000 mg | ORAL_TABLET | Freq: Every day | ORAL | Status: DC
  Administered 2023-09-01 – 2023-09-05 (×5): 40 mg via ORAL
  Filled 2023-09-01 (×5): qty 1

## 2023-09-01 MED ORDER — ISOSORBIDE MONONITRATE ER 60 MG PO TB24
120.0000 mg | ORAL_TABLET | Freq: Every day | ORAL | Status: DC
  Administered 2023-09-01 – 2023-09-05 (×5): 120 mg via ORAL
  Filled 2023-09-01 (×5): qty 2

## 2023-09-01 MED ORDER — SERTRALINE HCL 25 MG PO TABS
25.0000 mg | ORAL_TABLET | Freq: Every day | ORAL | Status: DC
  Administered 2023-09-01 – 2023-09-05 (×5): 25 mg via ORAL
  Filled 2023-09-01 (×5): qty 1

## 2023-09-01 MED ORDER — BUDESONIDE 0.5 MG/2ML IN SUSP
0.5000 mg | Freq: Two times a day (BID) | RESPIRATORY_TRACT | Status: DC
  Administered 2023-09-01 – 2023-09-05 (×9): 0.5 mg via RESPIRATORY_TRACT
  Filled 2023-09-01 (×9): qty 2

## 2023-09-01 MED ORDER — CARVEDILOL 6.25 MG PO TABS
6.2500 mg | ORAL_TABLET | Freq: Two times a day (BID) | ORAL | Status: DC
  Administered 2023-09-01 – 2023-09-05 (×8): 6.25 mg via ORAL
  Filled 2023-09-01 (×8): qty 1

## 2023-09-01 NOTE — Progress Notes (Signed)
NEUROLOGY CONSULT FOLLOW UP NOTE   Date of service: September 01, 2023 Patient Name: Sherry Fitzpatrick MRN:  [id number] DOB:  14-Jul-1951  Brief HPI  Sherry Fitzpatrick is a 72 y.o. female  has a past medical history of Acute non-Q wave non-ST elevation myocardial infarction (NSTEMI) (HCC) (05/24/2020), Acute respiratory failure with hypoxia (HCC) (11/22/2019), AKI (acute kidney injury) (HCC) (07/03/2018), Alterations of sensations, late effect of cerebrovascular disease(438.6), Arthritis, Backache, unspecified, Body mass index 40.0-44.9, adult (HCC), Carpal tunnel syndrome, Cerebral thrombosis with cerebral infarction (05/29/2020), Dizziness and giddiness, Esophageal reflux, Generalized pain, Gout attack (03/28/2020), Headache, Heart murmur, Hypertensive encephalopathy (03/28/2020), Mild cognitive impairment (07/16/2022), NSTEMI (non-ST elevated myocardial infarction) (HCC), Other abnormal glucose, Other and unspecified hyperlipidemia, Other malaise and fatigue, Other postprocedural status(V45.89), Other symptoms involving cardiovascular system, Shock (HCC), Sleep apnea, Stroke (HCC) (06/2003; 02/2010), Unspecified disorder of the pituitary gland and its hypothalamic control, Unspecified essential hypertension, and Unspecified visual disturbance. who presented after being found unresponsive by family at 10:30 AM on Saturday; she appeared to be asleep, but family could not wake her. Family had last seen her normal at 0730. She was placed on BIPAP.     Interval Hx/subjective   She is lying in the bed in NAD. No family at the bedside. RN at the bedside. She is on LTM. LTM read this morning showed continuous generalized slowing, no seizures identified. MRI brain with Single punctate focus of restricted diffusion involving the right lentiform nucleus  Vitals   Vitals:   09/01/23 0700 09/01/23 0748 09/01/23 0800 09/01/23 0900  BP: (!) 151/59  (!) 129/57 (!) 124/56  Pulse: (!) 55 (!) 57 (!) 57 (!) 58  Resp: 17 19  18 20   Temp:  98.3 F (36.8 C)    TempSrc:  Oral    SpO2: 97% 99% 99% 97%  Weight:         Body mass index is 40.3 kg/m.  Physical Exam   Constitutional: Appears well-developed and well-nourished.   Psych: Affect appropriate to situation.   Eyes: No scleral injection.    HENT: No OP obstrucion.   Head: Normocephalic.   Cardiovascular: Normal rate and regular rhythm.   Respiratory: Effort normal, non-labored breathing.   GI: Soft.  No distension. There is no tenderness.   Skin: WDI.    Neurologic Examination   Mental Status -  Level of arousal and orientation to time, place, and person were intact. She is unable to recall situation. . Language including expression, naming, repetition, comprehension was assessed and found intact . Attention span and concentration were normal . Recent and remote memory were intact . Fund of Knowledge was assessed and was intact .  Cranial Nerves II - XII - II - Visual fields intact OU . III, IV, VI - Extraocular movements intact . V - Facial sensation intact bilaterally . VII - Facial movement intact bilaterally . VIII - Hearing & vestibular intact bilaterally . X - Palate elevates symmetrically . XI - Chin turning & shoulder shrug intact bilaterally . XII - Tongue protrusion intact .  Motor Strength - The patient's strength was normal in left arm and left leg. Right arm 4/5 with drift and right leg 3/5 with drift   Bulk was normal and fasciculations were absent.   Motor Tone - Muscle tone was assessed at the neck and appendages and was normal . Sensory - Light touch, temperature/pinprick were assessed and were symmetrical.   Coordination - The patient had normal movements in the  hands and feet with no ataxia or dysmetria.  Intention Tremor noted to right arm  Gait and Station - Deferred.  Labs and Diagnostic Imaging   CBC:  Recent Labs  Lab 08/28/23 1006 08/31/23 1146 08/31/23 1151 08/31/23 1222 09/01/23 0658  WBC 5.8 8.0  --    --  6.6  NEUTROABS 2.9 5.5  --   --   --   HGB 12.1 11.1*   < > 12.6 10.8*  HCT 37.3 35.3*   < > 37.0 34.0*  MCV 88 92.2  --   --  90.2  PLT 294 269  --   --  228   < > = values in this interval not displayed.    Basic Metabolic Panel:  Lab Results  Component Value Date   NA 141 09/01/2023   K 4.0 09/01/2023   CO2 29 09/01/2023   GLUCOSE 97 09/01/2023   BUN 28 (H) 09/01/2023   CREATININE 1.90 (H) 09/01/2023   CALCIUM 8.8 (L) 09/01/2023   GFRNONAA 27 (L) 09/01/2023   GFRAA 60 (L) 06/09/2020   Lipid Panel:  Lab Results  Component Value Date   LDLCALC 131 (H) 08/28/2023   HgbA1c:  Lab Results  Component Value Date   HGBA1C 5.5 07/01/2023   Urine Drug Screen:     Component Value Date/Time   LABOPIA NONE DETECTED 07/01/2018 0625   COCAINSCRNUR NONE DETECTED 07/01/2018 0625   LABBENZ NONE DETECTED 07/01/2018 0625   AMPHETMU NONE DETECTED 07/01/2018 0625   THCU NONE DETECTED 07/01/2018 0625   LABBARB NONE DETECTED 07/01/2018 0625    Alcohol Level No results found for: "ETH" INR  Lab Results  Component Value Date   INR 1.3 (H) 05/27/2020   APTT  Lab Results  Component Value Date   APTT 28 05/27/2020   AED levels: No results found for: "PHENYTOIN", "ZONISAMIDE", "LAMOTRIGINE", "LEVETIRACETA"  CT Head without contrast(Personally reviewed): No acute process   MRI Brain(Personally reviewed): Single punctate focus of restricted diffusion involving the right lentiform nucleus, moderately advanced chronic microvascular ischemic disease with a few scattered remote infarcts.  LTM EEG 11/17:  This study is suggestive of mild to moderate diffuse encephalopathy. No seizures or epileptiform discharges were seen throughout the recording.   Impression  Sherry Fitzpatrick is a 72 y.o. female with an extensive PMHx who presented on Saturday morning with AMS, thought to be due to a metabolic encephalopathy in the setting of hypercarbia. CCM noticed possible right arm weakness on  exam. Neurology was consulted to further evaluate.  - Exam today reveals intact speech, basic orientation intact but cannot recall her reason for presenting, right arm 4/5 with drift and right leg 3/5 with drift  No sensory deficit. Intention tremor noted to right arm. Her exam today is markedly improved relative to her exam yesterday on presenting to the ED.  - MRI Brain: Single punctate focus of restricted diffusion involving the right lentiform nucleus, moderately advanced chronic microvascular ischemic disease with a few scattered remote infarcts. - LTM EEG report for today AM: Continuous generalized slowing. This study is suggestive of mild to moderate diffuse encephalopathy. No seizures or epileptiform discharges were seen throughout the recording.  - Impression: - AMS on presentation Saturday was most likely secondary to hypercarbia and/or hypoxia in the setting of her known COPD and prior presentations with acute hypoxic respiratory failure - Punctate acute right lentiform nucleus stroke  Recommendations  - LTM EEG has been discontinued.  - Full stroke workup to include:  -  HgbA1c, fasting lipid panel - MRA brain - Doppler carotids  - Frequent neuro checks - Echocardiogram - Prophylactic therapy-Antiplatelet med: Aspirin - dose 81mg  and plavix 75mg  daily    - Risk factor modification - Telemetry monitoring - PT consult, OT consult, Speech consult - Stroke team to follow ______________________________________________________________________   Signed, Mathews Argyle, NP [facility]  Electronically signed: Dr. Caryl Pina

## 2023-09-01 NOTE — Progress Notes (Signed)
A/Ox3, [place] 1 L.VS stable. NIH 4. EEG D/C, [place] at the bedside, Swallow test done/PASS, diet orders placed. Family called and updated. Waiting on MRA. Bed in low position, alarms are on, call bell in reach continue to monitor

## 2023-09-01 NOTE — Procedures (Addendum)
Patient Name: Sherry Fitzpatrick  MRN: [id number]  Epilepsy Attending: Charlsie Quest  Referring Physician/Provider: Caryl Pina, MD  Duration: 08/31/2023 2041 to 09/01/2023 1014  Patient history: 71 year old female with an extensive PMHx presenting with AMS, thought to be due to a metabolic encephalopathy in the setting of hypercarbia. CCM noticed possible right arm weakness on exam. Neurology was consulted to further evaluate. EEG to evaluate for seizure  Level of alertness: Awake, asleep  AEDs during EEG study: None  Technical aspects: This EEG study was done with scalp electrodes positioned according to the 10-20 International system of electrode placement. Electrical activity was reviewed with band pass filter of 1-70Hz , sensitivity of 7 uV/mm, display speed of 39mm/sec with a 60Hz  notched filter applied as appropriate. EEG data were recorded continuously and digitally stored.  Video monitoring was available and reviewed as appropriate.  Description: The posterior dominant rhythm consists of 7.5 Hz activity of moderate voltage (25-35 uV) seen predominantly in posterior head regions, symmetric and reactive to eye opening and eye closing. Sleep was characterized by vertex waves, sleep spindles (12 to 14 Hz), maximal frontocentral region. EEG showed continuous generalized predominantly 5- 6 Hz theta- slowing as well as intermittent 2-3hz  delta slowing. Hyperventilation and photic stimulation were not performed.     EEG was disconnected between 08/31/2023 2229 to 2358 for MRI  ABNORMALITY - Continuous slow, generalized  IMPRESSION: This study is suggestive of mild to moderate diffuse encephalopathy. No seizures or epileptiform discharges were seen throughout the recording.  Pierson Vantol [name]

## 2023-09-01 NOTE — Progress Notes (Signed)
LTM EEG discontinued - no skin breakdown at unhook.   

## 2023-09-01 NOTE — Progress Notes (Signed)
Carotid artery duplex completed. Please see CV Procedures for preliminary results.  Shona Simpson, RVT 09/01/23 10:51 AM

## 2023-09-01 NOTE — Consult Note (Signed)
   Cardiology Consultation   Patient ID: TILDA BARUA MRN: [id number]; DOB: Mar 02, 1951  Admit date: 08/31/2023 Date of Consult: 09/01/2023  PCP:  Shon Hale, MD   History of Present Illness:   Ms. Levene is a 72yo woman who I am seeing today for chest pain and shortness of breath at the request of Dr Isaiah Serge. She has a history of CAD s/p PCI, moderate AS, chronic diastolic HF, COPD, CKD3b, OSA, AF. She has not been anticoagulated for her AF for unclear reasons.   She was admitted with encephalopathy. She was recently started on meclizine and zofran by an urgent care center and became more confused and withdrawn after starting these medications. Since arriving to [facility], her mental status has slowly improved. No chest pain at the time of my evaluation.   Past medical, surgical, social and family history reviewed.  ROS:  Please see the history of present illness.  All other ROS reviewed and negative.     Physical Exam/Data:   Vitals:   09/01/23 1100 09/01/23 1130 09/01/23 1132 09/01/23 1141  BP: (!) 123/56 (!) 149/58  (!) 149/58  Pulse: (!) 54 (!) 56  (!) 53  Resp: 19 17  16   Temp:   97.9 F (36.6 C)   TempSrc:   Oral   SpO2: 97% 98%  98%  Weight:        General:  Overweight, female, no distress in bed at 45 degrees. Cardiac:  normal S1, S2; RRR; II/VI systolic murmur.  Lungs:  clear to auscultation bilaterally, no wheezing, rhonchi or rales  Psych:  blunted affect   EKG:  The EKG was personally reviewed and demonstrates:  sinus, poor quality ECG. QTc Telemetry:  Telemetry was personally reviewed and demonstrates:  sinus    Assessment and Plan:   #Encephalopathy Improving. Likely secondary to zofran and meclizine.   #Chest pain and shortness of breath #Hx of CAD Unclear cause of her chest pain. I would hold on LHC at this time given her abnormal renal function. Recommend echo for now. If TTE AV assessment is not conclusive, consider hemodynamic  cath to further interrogate vs TEE. Consider outpatient PET stress for coronary evaluation.  #AF Unclear burden. In sinus now. AF was diagnosed around the time of a PCI in 2021 complicated by groin/abdominal hematoma and stroke. Recommend 2 week live monitor as outpatient followed by ? ILR to monitor for recurrence.   Sheria Lang T. Lalla Brothers, MD, [facility], [facility] Cardiac Electrophysiology

## 2023-09-01 NOTE — Progress Notes (Signed)
Pt transported to MRI on BiPAP via [place] on 100% O2. Pt tol well. Unable to perform MRI w/BiPAP mask. Pt responding appropriately. Pt trial on [place]. Respirations remained even and unlabored. O2 Sats maintained 96-98% on 3L. Pt tol MRI well and transported back to 44M on [place]. BiPAP remains available @bedside . Will monitor.

## 2023-09-01 NOTE — Progress Notes (Signed)
NAME:  Sherry Fitzpatrick, MRN:  [id number], DOB:  02/16/1951, LOS: 1 ADMISSION DATE:  08/31/2023, CONSULTATION DATE:  08/31/23 REFERRING MD:  Rodena Medin, CHIEF COMPLAINT:  ACUTE HYPOXIC AND HYPERCARBIC RESP FAILURE    History of Present Illness:  72 year old female w/ significant PMH as outlined below. Just seen in cardiology office w/ report of intermittent chest pain and SOB. Reports not associated w/ activity or rest can come on and resolve spont. She had become more pale and also had walkin clinic visit at [facility] for dizziness the day before her cards eval. She was started on antivert and zofran to address vertigo and was being set up for further cardiac evaluation including echo and cardiac cath.  Per daughter pt has been getting more sleepy, withdrawn, and confused.   Pertinent  Medical History  COPD, obesity, type II DM, CAD s/p prior DESW w/mod AS, chronic diastolic hf, afib, CKD stage 3b, GERD, OSA, afib (not on AC), chronic anemia, HLD, GERD  Significant Hospital Events: Including procedures, antibiotic start and stop dates in addition to other pertinent events   11/16 admitted w/ acute metabolic encephalopathy 2/2 decomp HF, Hypercarbia and sedating drugs. MRI  small  punctate focus in right lentiform nucleus c/w tiny infarct 11/17 MRI neg for seizure moving out of ICU. Cards eval requested   Interim History / Subjective:  Now fully awake   Objective   Blood pressure (!) 143/65, pulse (!) 57, temperature 98.3 F (36.8 C), temperature source Oral, resp. rate 14, weight 106.5 kg, SpO2 99%.    Vent Mode: PCV;BIPAP FiO2 (%):  [35 %-50 %] 35 % Set Rate:  [20 bmp] 20 bmp PEEP:  [10 cmH20] 10 cmH20   Intake/Output Summary (Last 24 hours) at 09/01/2023 1029 Last data filed at 09/01/2023 0800 Gross per 24 hour  Intake --  Output 600 ml  Net -600 ml   Filed Weights   08/31/23 1539  Weight: 106.5 kg    Examination: General resting in bed. Getting carotid US currently HENT  NCAT no JVD  Pulm clear no wheezing Card rrr Abd soft Ext no sig edema  Neuro now alert and oriented. RLE is weak   Resolved Hospital Problem list     Assessment & Plan:  Acute metabolic encephalopathy (resolved) due to hypercarbia and AE from meclizine  -ammonia, UDS, apap and asa unremarkable  -PCT reassuring Plan Dc meclizine place on allergy list  Avoid sedating meds  Acute CVA  Single punctate focus in right lentiform nucleus c/w tiny infarct Plan Cont asa Additional rx as directed by neuro team  Await carotid US results PT consult  Acute hypercarbic resp failure 2/2 pulmonary edema superimposed on underlying obstructive lung disease and obesity  -carries h/o "copd" seems like more likely chronic/fixed asthma w/ exertional dyspnea from HF Plan Repeat lasix today  Wean O2 Mobilize  Cont ICS/LABA  PRN duoneb Am cxr and BNP  Coronary artery disease w/ intermittent angina and h/o  prior DES now w/ acute heart failure  and known AS and atrial fib  Plan Follow up echo Trend BNP Resume home meds:  Tele  Rate control  Repeat trop I to ensure clearing   Acute on chronic renal failure  Stage IIIB Scr a little better Plan Ensure adequate MAP Renal dose meds Strict I&O Am chem   Type II DM Plan Ssi goal 140-180    Best Practice (right click and "Reselect all SmartList Selections" daily)   Diet/type: Regular consistency (see  orders) DVT prophylaxis: prophylactic heparin  GI prophylaxis: PPI Lines: N/A Foley:  N/A Code Status:  full code Last date of multidisciplinary goals of care discussion [pending]    Critical care time: NA

## 2023-09-02 ENCOUNTER — Ambulatory Visit (HOSPITAL_COMMUNITY): Admission: RE | Admit: 2023-09-02 | Payer: 59 | Source: Home / Self Care | Admitting: Cardiology

## 2023-09-02 ENCOUNTER — Other Ambulatory Visit (HOSPITAL_COMMUNITY): Payer: Self-pay

## 2023-09-02 ENCOUNTER — Inpatient Hospital Stay (HOSPITAL_COMMUNITY): Payer: 59

## 2023-09-02 DIAGNOSIS — I10 Essential (primary) hypertension: Secondary | ICD-10-CM | POA: Diagnosis not present

## 2023-09-02 DIAGNOSIS — G4733 Obstructive sleep apnea (adult) (pediatric): Secondary | ICD-10-CM

## 2023-09-02 DIAGNOSIS — D649 Anemia, unspecified: Secondary | ICD-10-CM | POA: Diagnosis not present

## 2023-09-02 DIAGNOSIS — I200 Unstable angina: Secondary | ICD-10-CM

## 2023-09-02 DIAGNOSIS — R4182 Altered mental status, unspecified: Secondary | ICD-10-CM | POA: Diagnosis not present

## 2023-09-02 DIAGNOSIS — I6389 Other cerebral infarction: Secondary | ICD-10-CM

## 2023-09-02 LAB — CBC
HCT: 32.4 % — ABNORMAL LOW (ref 36.0–46.0)
Hemoglobin: 10 g/dL — ABNORMAL LOW (ref 12.0–15.0)
MCH: 27.6 pg (ref 26.0–34.0)
MCHC: 30.9 g/dL (ref 30.0–36.0)
MCV: 89.5 fL (ref 80.0–100.0)
Platelets: 238 10*3/uL (ref 150–400)
RBC: 3.62 MIL/uL — ABNORMAL LOW (ref 3.87–5.11)
RDW: 16.3 % — ABNORMAL HIGH (ref 11.5–15.5)
WBC: 8.5 10*3/uL (ref 4.0–10.5)
nRBC: 0 % (ref 0.0–0.2)

## 2023-09-02 LAB — ECHOCARDIOGRAM COMPLETE
AR max vel: 1.71 cm2
AV Area VTI: 1.9 cm2
AV Area mean vel: 1.64 cm2
AV Mean grad: 31 mm[Hg]
AV Peak grad: 50 mm[Hg]
Ao pk vel: 3.54 m/s
Area-P 1/2: 1.88 cm2
MV M vel: 3.8 m/s
MV Peak grad: 57.8 mm[Hg]
MV VTI: 2.91 cm2
S' Lateral: 2.9 cm
Weight: 3696.67 [oz_av]

## 2023-09-02 LAB — HEMOGLOBIN A1C
Hgb A1c MFr Bld: 7 % — ABNORMAL HIGH (ref 4.8–5.6)
Mean Plasma Glucose: 154.2 mg/dL

## 2023-09-02 LAB — BASIC METABOLIC PANEL
Anion gap: 12 (ref 5–15)
BUN: 34 mg/dL — ABNORMAL HIGH (ref 8–23)
CO2: 26 mmol/L (ref 22–32)
Calcium: 8.5 mg/dL — ABNORMAL LOW (ref 8.9–10.3)
Chloride: 102 mmol/L (ref 98–111)
Creatinine, Ser: 1.91 mg/dL — ABNORMAL HIGH (ref 0.44–1.00)
GFR, Estimated: 27 mL/min — ABNORMAL LOW (ref >60)
Glucose, Bld: 107 mg/dL — ABNORMAL HIGH (ref 70–99)
Potassium: 4.2 mmol/L (ref 3.5–5.1)
Sodium: 140 mmol/L (ref 135–145)

## 2023-09-02 LAB — GLUCOSE, CAPILLARY: Glucose-Capillary: 96 mg/dL (ref 70–99)

## 2023-09-02 LAB — LIPID PANEL
Cholesterol: 159 mg/dL (ref 0–200)
HDL: 31 mg/dL — ABNORMAL LOW (ref >40)
LDL Cholesterol: 109 mg/dL — ABNORMAL HIGH (ref 0–99)
Total CHOL/HDL Ratio: 5.1 {ratio}
Triglycerides: 97 mg/dL (ref <150)
VLDL: 19 mg/dL (ref 0–40)

## 2023-09-02 LAB — PROCALCITONIN: Procalcitonin: <0.1 ng/mL

## 2023-09-02 MED ORDER — OXYCODONE HCL 5 MG PO TABS: 5.0000 mg | ORAL_TABLET | Freq: Once | ORAL | Status: DC | PRN

## 2023-09-02 MED ORDER — PROCHLORPERAZINE EDISYLATE 10 MG/2ML IJ SOLN: 5.0000 mg | Freq: Four times a day (QID) | INTRAMUSCULAR | Status: DC | PRN

## 2023-09-02 MED ORDER — MELATONIN 5 MG PO TABS
5.0000 mg | ORAL_TABLET | Freq: Every evening | ORAL | Status: DC | PRN
  Administered 2023-09-03: 5 mg via ORAL
  Filled 2023-09-02: qty 1

## 2023-09-02 MED ORDER — ACETAMINOPHEN 325 MG PO TABS
650.0000 mg | ORAL_TABLET | Freq: Four times a day (QID) | ORAL | Status: DC | PRN
  Administered 2023-09-03 – 2023-09-05 (×3): 650 mg via ORAL
  Filled 2023-09-02 (×3): qty 2

## 2023-09-02 MED ORDER — POLYETHYLENE GLYCOL 3350 17 G PO PACK: 17.0000 g | PACK | Freq: Every day | ORAL | Status: DC | PRN

## 2023-09-02 MED ORDER — TICAGRELOR 90 MG PO TABS
90.0000 mg | ORAL_TABLET | Freq: Two times a day (BID) | ORAL | Status: DC
  Administered 2023-09-03 – 2023-09-04 (×4): 90 mg via ORAL
  Filled 2023-09-02 (×4): qty 1

## 2023-09-02 NOTE — Progress Notes (Addendum)
Rounding Note    Patient Name: Sherry Fitzpatrick Date of Encounter: 09/02/2023  [facility] Cardiologist: Thomasene Ripple, DO   Subjective   No complaint today. No chest pain.   Inpatient Medications    Scheduled Meds:   stroke: early stages of recovery book   Does not apply Once   amiodarone  200 mg Oral Daily   amLODipine  5 mg Oral Daily   arformoterol  15 mcg Nebulization BID   atorvastatin  40 mg Oral Daily   budesonide (PULMICORT) nebulizer solution  0.5 mg Nebulization BID   carvedilol  6.25 mg Oral BID WC   Chlorhexidine Gluconate Cloth  6 each Topical Daily   clopidogrel  75 mg Oral Daily   hydrALAZINE  25 mg Oral Q8H   isosorbide mononitrate  120 mg Oral Daily   mupirocin ointment  1 Application Nasal BID   pantoprazole  40 mg Oral Q1200   sertraline  25 mg Oral Daily   Continuous Infusions:  PRN Meds:    Vital Signs    Vitals:   09/01/23 2038 09/01/23 2357 09/02/23 0336 09/02/23 0737  BP: 117/65 129/63 138/68 134/84  Pulse: 60 (!) 58 (!) 55 (!) 55  Resp: 20 18 20 15   Temp: 99.4 F (37.4 C) 99 F (37.2 C) 98.8 F (37.1 C)   TempSrc: Oral Oral Oral   SpO2: 96% 100% 97% 99%  Weight:   104.8 kg     Intake/Output Summary (Last 24 hours) at 09/02/2023 0744 Last data filed at 09/01/2023 2358 Gross per 24 hour  Intake 660 ml  Output 1275 ml  Net -615 ml      09/02/2023    3:36 AM 08/31/2023    3:39 PM 08/28/2023    8:55 AM  Last 3 Weights  Weight (lbs) 231 lb 0.7 oz 234 lb 12.6 oz 235 lb 6.4 oz  Weight (kg) 104.8 kg 106.5 kg 106.777 kg      Telemetry    Sinus brady - Personally Reviewed  ECG    No am EKG - Personally Reviewed  Physical Exam   GEN: Obese female in No acute distress.   Neck: No JVD Cardiac: RRR, no murmurs, rubs, or gallops.  Respiratory: Clear to auscultation bilaterally. GI: Soft, nontender, non-distended  MS: No LE edema; No deformity. Neuro:  Nonfocal  Psych: Normal affect   Labs    High  Sensitivity Troponin:   Recent Labs  Lab 08/31/23 1146 08/31/23 1455  TROPONINIHS 86* 183*     Chemistry Recent Labs  Lab 08/28/23 1006 08/28/23 1006 08/31/23 1146 08/31/23 1151 08/31/23 1222 09/01/23 0658 09/02/23 0308  NA 139  --  139   < > 140 141 140  K 4.2  --  5.4*   < > 4.9 4.0 4.2  CL 101  --  103  --  105 104 102  CO2 22  --  27  --   --  29 26  GLUCOSE 142*   < > 138*  --  135* 97 107*  BUN 18   < > 27*  --  30* 28* 34*  CREATININE 1.29*  --  2.07*  --  2.00* 1.90* 1.91*  CALCIUM 9.2  --  8.7*  --   --  8.8* 8.5*  MG 2.1  --   --   --   --   --   --   PROT 7.2  --  7.1  --   --   --   --  ALBUMIN 3.9  --  3.3*  --   --   --   --   AST 15  --  19  --   --   --   --   ALT 13  --  11  --   --   --   --   ALKPHOS 82  --  53  --   --   --   --   BILITOT 0.2  --  0.8  --   --   --   --   GFRNONAA  --   --  25*  --   --  27* 27*  ANIONGAP  --   --  9  --   --  8 12   < > = values in this interval not displayed.    Lipids  Recent Labs  Lab 08/28/23 1006 09/02/23 0308  CHOL 197 159  TRIG 178* 97  HDL 34* 31*  LABVLDL 32  --   LDLCALC 131* 347*  CHOLHDL 5.8* 5.1    Hematology Recent Labs  Lab 08/31/23 1146 08/31/23 1151 08/31/23 1222 09/01/23 0658 09/02/23 0308  WBC 8.0  --   --  6.6 8.5  RBC 3.83*  --   --  3.77* 3.62*  HGB 11.1*   < > 12.6 10.8* 10.0*  HCT 35.3*   < > 37.0 34.0* 32.4*  MCV 92.2  --   --  90.2 89.5  MCH 29.0  --   --  28.6 27.6  MCHC 31.4  --   --  31.8 30.9  RDW 16.8*  --   --  16.6* 16.3*  PLT 269  --   --  228 238   < > = values in this interval not displayed.   Thyroid  Recent Labs  Lab 08/31/23 1455  TSH 4.530*    BNP Recent Labs  Lab 08/31/23 1146 09/01/23 0658  BNP 377.4* 275.5*    DDimer No results for input(s): "DDIMER" in the last 168 hours.   Radiology    MR ANGIO HEAD WO CONTRAST  Result Date: 09/01/2023 CLINICAL DATA:  Stroke follow-up. EXAM: MRA HEAD WITHOUT CONTRAST TECHNIQUE: Angiographic images  of the Circle of Willis were acquired using MRA technique without intravenous contrast. COMPARISON:  MRA head 05/28/2020 FINDINGS: Anterior circulation: The internal carotid arteries are patent from skull base to carotid termini with similar appearance of multifocal stenoses, including severe right and moderate to severe left proximal supraclinoid stenoses. ACAs and MCAs are patent without evidence of a proximal branch occlusion or flow limiting M1 stenosis. Severe bilateral A1 stenoses are again seen. There are also severe bilateral M2 stenoses which have likely mildly progressed. No aneurysm is identified. Posterior circulation: The intracranial vertebral arteries are patent to the basilar. A severe right V4 stenosis immediately distal to the origin of the right PICA is unchanged from the prior study. A mild proximal left V4 stenosis is less prominent than on the prior study. Patent PICA and SCA origins are visualized bilaterally. The basilar artery is patent and mildly small in caliber diffusely with mild irregularity but no significant focal stenosis. Posterior communicating arteries are diminutive or absent. Severe bilateral distal P1 and left P2 stenoses appear mildly progressive. No aneurysm is identified. Anatomic variants: None. IMPRESSION: Severe intracranial atherosclerosis as detailed above with mild progression from 2021. No large vessel occlusion. Electronically Signed   By: Sebastian Ache M.D.   On: 09/01/2023 17:13   VAS US CAROTID  Result  Date: 09/01/2023 Carotid Arterial Duplex Study Patient Name:  Sherry Fitzpatrick  Date of Exam:   09/01/2023 Medical Rec #: [id number]        Accession #:    [id number] Date of Birth: 1951-05-28         Patient Gender: F Patient Age:   72 years Exam Location:  [facility] Procedure:      VAS US CAROTID Referring Phys: Gevena Mart --------------------------------------------------------------------------------  Indications:  CVA. Risk Factors: Hypertension,  prior MI, prior CVA. Performing Technologist: Shona Simpson  Examination Guidelines: A complete evaluation includes B-mode imaging, spectral Doppler, color Doppler, and power Doppler as needed of all accessible portions of each vessel. Bilateral testing is considered an integral part of a complete examination. Limited examinations for reoccurring indications may be performed as noted.  Right Carotid Findings: +----------+--------+--------+--------+------------------+------------------+           PSV cm/sEDV cm/sStenosisPlaque DescriptionComments           +----------+--------+--------+--------+------------------+------------------+ CCA Prox  75      14                                                   +----------+--------+--------+--------+------------------+------------------+ CCA Distal83      19                                intimal thickening +----------+--------+--------+--------+------------------+------------------+ ICA Prox  93      37      1-39%   heterogenous                         +----------+--------+--------+--------+------------------+------------------+ ICA Distal109     41                                                   +----------+--------+--------+--------+------------------+------------------+ ECA       151     28                                                   +----------+--------+--------+--------+------------------+------------------+ +----------+--------+-------+--------+-------------------+           PSV cm/sEDV cmsDescribeArm Pressure (mmHG) +----------+--------+-------+--------+-------------------+ Subclavian151     0                                  +----------+--------+-------+--------+-------------------+ +---------+--------+--+--------+--+ VertebralPSV cm/s76EDV cm/s20 +---------+--------+--+--------+--+  Left Carotid Findings: +----------+--------+--------+--------+------------------+--------+           PSV cm/sEDV  cm/sStenosisPlaque DescriptionComments +----------+--------+--------+--------+------------------+--------+ CCA Prox  60      12                                         +----------+--------+--------+--------+------------------+--------+ CCA Distal55      15                                         +----------+--------+--------+--------+------------------+--------+  ICA Prox  112     46      40-59%  heterogenous               +----------+--------+--------+--------+------------------+--------+ ICA Mid   93      28                                         +----------+--------+--------+--------+------------------+--------+ ICA Distal87      29                                         +----------+--------+--------+--------+------------------+--------+ ECA       118     9                                          +----------+--------+--------+--------+------------------+--------+ +----------+--------+--------+--------+-------------------+           PSV cm/sEDV cm/sDescribeArm Pressure (mmHG) +----------+--------+--------+--------+-------------------+ [id number]      0                                   +----------+--------+--------+--------+-------------------+ +---------+--------+--+--------+--+ VertebralPSV cm/s55EDV cm/s17 +---------+--------+--+--------+--+   Summary: Right Carotid: Velocities in the right ICA are consistent with a 1-39% stenosis. Left Carotid: Velocities in the left ICA are consistent with a 40-59% stenosis. Vertebrals:  Bilateral vertebral arteries demonstrate antegrade flow. Subclavians: Normal flow hemodynamics were seen in bilateral subclavian              arteries. *See table(s) above for measurements and observations.     Preliminary    MR BRAIN WO CONTRAST  Result Date: 09/01/2023 CLINICAL DATA:  Initial evaluation for neuro deficit, stroke suspected. EXAM: MRI HEAD WITHOUT CONTRAST TECHNIQUE: Multiplanar, multiecho pulse  sequences of the brain and surrounding structures were obtained without intravenous contrast. COMPARISON:  CT from earlier the same day as well as previous MRI from 08/04/2022 FINDINGS: Brain: Cerebral volume within normal limits for age. Patchy and confluent T2/FLAIR hyperintensity involving the periventricular deep white matter both cerebral hemispheres as well as the pons, consistent with chronic small vessel ischemic disease, moderately advanced. Few scatter remote lacunar infarcts noted about the right frontal centrum semi ovale and basal ganglia. Probable tiny remote left cerebellar infarct noted. Single punctate focus of restricted diffusion seen involving the right lentiform nucleus (series 5, image 72). Corresponding signal loss on ADC map (series 6, image 24). Findings suspicious for a tiny acute ischemic infarct. No associated hemorrhage. No other evidence for acute or subacute ischemia. No other acute or chronic intracranial blood products. No mass lesion, midline shift or mass effect. No hydrocephalus or extra-axial fluid collection. Partially empty sella noted. Suprasellar region within normal limits. Vascular: Major intracranial vascular flow voids are maintained. Skull and upper cervical spine: Case of noted within normal limits. Bone marrow signal intensity normal. No scalp soft tissue abnormality. Sinuses/Orbits: Globes and orbital soft tissues within normal limits. Paranasal sinuses are largely clear. Trace right mastoid effusion, of doubtful significance. Other: None. IMPRESSION: 1. Single punctate focus of restricted diffusion involving the right lentiform nucleus, suspicious for a tiny acute ischemic infarct. No associated hemorrhage. 2. Underlying moderately advanced chronic microvascular ischemic  disease with a few scattered remote infarcts as above. Electronically Signed   By: Rise Mu M.D.   On: 09/01/2023 00:26   CT Head Wo Contrast  Result Date: 08/31/2023 CLINICAL DATA:   Mental status change, unknown cause. EXAM: CT HEAD WITHOUT CONTRAST TECHNIQUE: Contiguous axial images were obtained from the base of the skull through the vertex without intravenous contrast. RADIATION DOSE REDUCTION: This exam was performed according to the departmental dose-optimization program which includes automated exposure control, adjustment of the mA and/or kV according to patient size and/or use of iterative reconstruction technique. COMPARISON:  Head MRI 08/04/2022 FINDINGS: Brain: There is no evidence of an acute infarct, intracranial hemorrhage, mass, midline shift, or extra-axial fluid collection. Cerebral volume is within normal limits for age. Patchy hypodensities in the cerebral white matter nonspecific but compatible with moderate chronic small vessel ischemic disease. A chronic lacunar infarct is again seen in the left caudate head. Vascular: Calcified atherosclerosis at the skull base. No hyperdense vessel. Skull: No acute fracture or suspicious osseous lesion. Sinuses/Orbits: Visualized paranasal sinuses and mastoid air cells are clear. Unremarkable orbits. Other: None. IMPRESSION: 1. No evidence of acute intracranial abnormality. 2. Moderate chronic small vessel ischemic disease. Electronically Signed   By: Sebastian Ache M.D.   On: 08/31/2023 15:39   DG Chest Port 1 View  Result Date: 08/31/2023 CLINICAL DATA:  Found unresponsive EXAM: PORTABLE CHEST 1 VIEW COMPARISON:  07/05/2023 FINDINGS: Cardiomegaly with coronary stenting, stable. Interstitial coarsening also seen on prior, likely vascular congestion. No Kerley lines, effusion, air bronchogram, or pneumothorax. IMPRESSION: Cardiomegaly and vascular congestion. No change when compared to prior. Electronically Signed   By: Tiburcio Pea M.D.   On: 08/31/2023 12:22    Cardiac Studies     Patient Profile     72 y.o. female with history of COPD, DM, CKD, sleep apnea, aortic stenosis, PAF and CAD admitted with acute metabolic  encephalopathy secondary to acute hypercarbic respiratory failure, pulmonary edema and CVA with acute on chronic renal failure. She was seen on 09/01/23 by Dr. Lalla Brothers for evaluation of chest pain and dyspnea.   Assessment & Plan    CAD with chest pain: She has had prior coronary intervention with placement of a drug eluting stent in the LAD in 2021. She c/o chest pain on admission. Mild elevation of troponin in setting of acute respiratory failure. She has no chest pain today. Echo is pending. Given acute on chronic renal failure, I would not plan cardiac cath today. There were plans in place for an outpatient cardiac cath. I think it would be reasonable to try and plan her cath before discharge if her renal function improves.   Aortic stenosis: Moderate by echo in March 2024 with mean gradient of 22 mmHg. Echo today to assess.   Paroxysmal atrial fibrillation: Sinus today. Unclear why she has not been on anti-coagulation. She is on Plavix. With recent CVA, would recommend that she have a live cardiac monitor at time of discharge.   For questions or updates, please contact [facility] Please consult [email or web address] for contact info under        Signed, Verne Carrow, MD  09/02/2023, 7:44 AM

## 2023-09-02 NOTE — TOC Initial Note (Signed)
Transition of Care [facility]) - Initial/Assessment Note    Patient Details  Name: Sherry Fitzpatrick MRN: [id number] Date of Birth: 01/06/1951  Transition of Care [facility]) CM/SW Contact:    Harriet Masson, RN Phone Number: 09/02/2023, 4:06 PM  Clinical Narrative:                 Spoke to patient at bedside regarding transition needs.  Patient lives with daughter and has a walker. Patient states she no longer has home 02. Patient has used [facility] in the past and would like to use them again.  Kelly with [facility] is unable to accept today and requested TOC contact her closer to dc date. TOC will continue to follow. Address, Phone number and PCP verified.  Expected Discharge Plan: Home w Home Health Services Barriers to Discharge: Continued Medical Work up   Patient Goals and CMS Choice Patient states their goals for this hospitalization and ongoing recovery are:: returnhome with daughter CMS Medicare.gov Compare Post Acute Care list provided to:: Patient Choice offered to / list presented to : Patient      Expected Discharge Plan and Services   Discharge Planning Services: CM Consult Post Acute Care Choice: Home Health Living arrangements for the past 2 months: Single Family Home                                      Prior Living Arrangements/Services Living arrangements for the past 2 months: Single Family Home Lives with:: Adult Children Patient language and need for interpreter reviewed:: Yes Do you feel safe going back to the place where you live?: Yes      Need for Family Participation in Patient Care: Yes (Comment) Care giver support system in place?: Yes (comment)   Criminal Activity/Legal Involvement Pertinent to Current Situation/Hospitalization: No - Comment as needed  Activities of Daily Living      Permission Sought/Granted                  Emotional Assessment Appearance:: Appears stated age   Affect (typically observed):  Accepting Orientation: : Oriented to Place, Oriented to  Time, Oriented to Situation, Oriented to Self   Psych Involvement: No (comment)  Admission diagnosis:  Respiratory failure with hypoxia (HCC) [J96.91] Altered mental status, unspecified altered mental status type [R41.82] Patient Active Problem List   Diagnosis Date Noted   Altered mental status 09/01/2023   Hx of non-insulin dependent diabetes mellitus 07/01/2023   History of coronary artery disease 07/01/2023   Restrictive cardiomyopathy (HCC) 07/01/2023   History of obstructive sleep apnea 07/01/2023   Obesity (BMI 30-39.9) 07/01/2023   COPD with acute exacerbation (HCC) 12/17/2022   Acute respiratory failure with hypoxia (HCC) 12/15/2022   Type 2 diabetes mellitus with hyperglycemia, with long-term current use of insulin (HCC)    Hypoalbuminemia due to protein-calorie malnutrition (HCC)    Paroxysmal atrial fibrillation (HCC) -On DAPT per neurology. Pt has not been changed to systemic anticoagulation by outpatient neurology.    Chronic systolic congestive heart failure (HCC)    Status post coronary artery stent placement    Dysphagia, post-stroke    Chronic anemia 03/27/2020   History of CVA (cerebrovascular accident) 03/27/2020   COPD (chronic obstructive pulmonary disease) (HCC) 11/22/2019   Morbid obesity (HCC) 10/29/2019   CKD stage 3b, GFR 30-44 ml/min (HCC) 10/29/2019   Tobacco abuse 07/01/2018   Diastolic heart failure (HCC) 07/01/2018  Positive D dimer    Right sided weakness 04/14/2015   Hyperlipidemia    Essential hypertension    Asthma    OSA (obstructive sleep apnea) 05/06/2013   Anxiety and depression 11/20/2011   Esophageal reflux 11/20/2011   PCP:  Shon Hale, MD Pharmacy:   [phone] - [place], [place] - [address] AT [facility] [address] [place] [address] Phone: [phone] Fax: [phone]  [facility] [address] [place] [address] Phone: [phone] Fax: [phone]     Social Determinants of Health (SDOH) Social History: SDOH Screenings   Food Insecurity: No Food Insecurity (07/02/2023)  Housing: Low Risk  (07/02/2023)  Transportation Needs: No Transportation Needs (07/02/2023)  Utilities: Not At Risk (07/02/2023)  Tobacco Use: Low Risk  (08/28/2023)   SDOH Interventions:     Readmission Risk Interventions    07/03/2023   11:10 AM 12/18/2022    2:36 PM  Readmission Risk Prevention Plan  Transportation Screening Complete Complete  PCP or Specialist Appt within 5-7 Days  Complete  PCP or Specialist Appt within 3-5 Days Complete   Home Care Screening  Complete  Medication Review (RN CM)  Complete  HRI or Home Care Consult Complete   Social Work Consult for Recovery Care Planning/Counseling Complete   Palliative Care Screening Not Applicable   Medication Review Oceanographer) Referral to Pharmacy

## 2023-09-02 NOTE — Progress Notes (Signed)
Patient with contrast allergy and previously scheduled outpatient cath with instructions for prednisone and benadryl.  No orders placed for patient.  Cath lab RN notified. Please update daughter, [name] if she is not at the bedside.

## 2023-09-02 NOTE — Progress Notes (Signed)
  Echocardiogram 2D Echocardiogram has been performed.  Sherry Fitzpatrick 09/02/2023, 1:42 PM

## 2023-09-02 NOTE — Evaluation (Signed)
Occupational Therapy Evaluation Patient Details Name: Sherry Fitzpatrick MRN: [id number] DOB: [birth year] Today's Date: 09/02/2023   History of Present Illness 72 year old with prior history of COPD, diabetes, coronary artery disease obstructive sleep apnea, GERD presents with chest pain and shortness of breath along with some dizziness.  She was started on Antivert and Zofran to address vertigo and was being set up for further cardiac evaluation.  But on taking Antivert and Zofran patient became more sleepy confused and presented to ED with acute metabolic encephalopathy on 11/16 was found to be in decompensated heart failure hypercarbia.  MRI of the brain showed small punctate focus in right lentiform nucleus consistent with tiny infarct   Clinical Impression   Pt presents with decline in function and safety with ADLs and ADL mobility with impaired strength, balance and endurance. PTA pt lived with her daughter and was Ind with ADLs/selfcare, used RW for mobility; family assists with IADLs and transportation. Pt currently requires mod A for LB ADLs, CGA standing at sink for grooming, min A with functional transfers and toileting tasks.  Pt reported dizziness upon sitting EOB, quickly resolving, increased time and effort required, use of rail. Able to scoot hips to EOB with min verbal cues to initiate. O2 SATs dropping 87-88% on RA and pt instructed in deep, pursed lip breathing to recover to >90% in less than 1 minute. Pt would benefit from acute OT services to maximize level of function and safety       If plan is discharge home, recommend the following: A little help with bathing/dressing/bathroom;A little help with walking and/or transfers;Assistance with cooking/housework    Functional Status Assessment  Patient has had a recent decline in their functional status and demonstrates the ability to make significant improvements in function in a reasonable and predictable amount of time.  Equipment  Recommendations  Other (comment) (sock aid, long handle bath sponge)    Recommendations for Other Services       Precautions / Restrictions Precautions Precautions: Fall Restrictions Weight Bearing Restrictions: No      Mobility Bed Mobility Overal bed mobility: Needs Assistance Bed Mobility: Supine to Sit     Supine to sit: Min assist, HOB elevated, Used rails     General bed mobility comments: pt reported dizziness upon sitting EOB, quickly resolving, increased time and effort required, use of rail. Able to scoot hips to EOB with min verbal cues to initiate. O2 SATs dropping 87-88% on RA and pt instructed in deep, pursed lip breathing to recover to >90% in less than 1 minute    Transfers Overall transfer level: Needs assistance Equipment used: Rolling walker (2 wheels) Transfers: Sit to/from Stand, Bed to chair/wheelchair/BSC Sit to Stand: Min assist, +2 physical assistance           General transfer comment: min A +2, progressing to min A      Balance Overall balance assessment: Needs assistance Sitting-balance support: No upper extremity supported Sitting balance-Leahy Scale: Good Sitting balance - Comments: pt reported dizziness upon sitting EOB, quickly resolving   Standing balance support: Bilateral upper extremity supported, No upper extremity supported, During functional activity Standing balance-Leahy Scale: Fair Standing balance comment: Pt used RW to walk to sink and stood at sink for grooming/hygiene tasks CGA with chair behind he rfor safety                           ADL either performed or assessed with clinical judgement  ADL Overall ADL's : Needs assistance/impaired Eating/Feeding: Independent   Grooming: Wash/dry hands;Wash/dry face;Contact guard assist;Standing   Upper Body Bathing: Set up;Supervision/ safety   Lower Body Bathing: Moderate assistance   Upper Body Dressing : Set up;Supervision/safety   Lower Body Dressing:  Moderate assistance   Toilet Transfer: Minimal assistance;Ambulation;Rolling walker (2 wheels)   Toileting- Clothing Manipulation and Hygiene: Minimal assistance;Sit to/from stand       Functional mobility during ADLs: Minimal assistance;Rolling walker (2 wheels);Cueing for safety       Vision Baseline Vision/History: 1 Wears glasses (readers) Ability to See in Adequate Light: 0 Adequate Patient Visual Report: No change from baseline       Perception         Praxis         Pertinent Vitals/Pain Pain Assessment Pain Assessment: 0-10 Pain Score: 5  Pain Location: R LE/hip Pain Descriptors / Indicators: Aching Pain Intervention(s): Monitored during session, Repositioned     Extremity/Trunk Assessment Upper Extremity Assessment Upper Extremity Assessment: Overall WFL for tasks assessed;Generalized weakness   Lower Extremity Assessment Lower Extremity Assessment: Defer to PT evaluation   Cervical / Trunk Assessment Cervical / Trunk Assessment: Normal   Communication     Cognition Arousal: Alert Behavior During Therapy: WFL for tasks assessed/performed Overall Cognitive Status: Impaired/Different from baseline Area of Impairment: Memory, Safety/judgement, Awareness, Problem solving                     Memory: Decreased short-term memory   Safety/Judgement: Decreased awareness of safety   Problem Solving: Slow processing General Comments: pt's daughter reports that pt is almost near cognitive baseline     General Comments       Exercises     Shoulder Instructions      Home Living Family/patient expects to be discharged to:: Private residence Living Arrangements: Children Available Help at Discharge: Family;Available 24 hours/day Type of Home: House Home Access: Stairs to enter [employer] of Steps: 1 Entrance Stairs-Rails:  (pole on each side of steps) Home Layout: One level     Bathroom Shower/Tub: Contractor: Handicapped height     Home Equipment: Agricultural consultant (2 wheels);Rollator (4 wheels);Grab bars - tub/shower;BSC/3in1   Additional Comments: lives with her daughter/family      Prior Functioning/Environment Prior Level of Function : Independent/Modified Independent             Mobility Comments: has been using RW ADLs Comments: Pt reports independent in ADLs and that daughter assists with IADLs; difficulty donning socks        OT Problem List: Decreased strength;Impaired balance (sitting and/or standing);Decreased cognition;Decreased activity tolerance;Decreased knowledge of use of DME or AE      OT Treatment/Interventions: Self-care/ADL training;DME and/or AE instruction;Therapeutic activities;Balance training;Therapeutic exercise;Patient/family education;Energy conservation    OT Goals(Current goals can be found in the care plan section) Acute Rehab OT Goals Patient Stated Goal: "go home" OT Goal Formulation: With patient/family Time For Goal Achievement: 09/16/23 Potential to Achieve Goals: Good ADL Goals Pt Will Perform Grooming: with supervision;with set-up;standing Pt Will Perform Upper Body Bathing: with set-up Pt Will Perform Lower Body Bathing: with min assist Pt Will Perform Upper Body Dressing: with set-up Pt Will Perform Lower Body Dressing: with min assist Pt Will Transfer to Toilet: with contact guard assist;with supervision;ambulating Pt Will Perform Toileting - Clothing Manipulation and hygiene: with contact guard assist;with supervision;sit to/from stand  OT Frequency: Min 2X/week    Co-evaluation PT/OT/SLP  Co-Evaluation/Treatment: Yes Reason for Co-Treatment: For patient/therapist safety;To address functional/ADL transfers   OT goals addressed during session: ADL's and self-care      AM-PAC OT "6 Clicks" Daily Activity     Outcome Measure Help from another person eating meals?: None Help from another person taking care of personal  grooming?: A Little Help from another person toileting, which includes using toliet, bedpan, or urinal?: A Little Help from another person bathing (including washing, rinsing, drying)?: A Lot Help from another person to put on and taking off regular upper body clothing?: A Little Help from another person to put on and taking off regular lower body clothing?: A Lot 6 Click Score: 17   End of Session Equipment Utilized During Treatment: Gait belt;Rolling walker (2 wheels)  Activity Tolerance: Patient tolerated treatment well Patient left: in chair;with call bell/phone within reach;with family/visitor present  OT Visit Diagnosis: Other abnormalities of gait and mobility (R26.89);Muscle weakness (generalized) (M62.81)                Time: 2130-8657 OT Time Calculation (min): 38 min Charges:  OT General Charges $OT Visit: 1 Visit OT Evaluation $OT Eval Moderate Complexity: 1 Mod OT Treatments $Self Care/Home Management : 8-22 mins    Galen Manila 09/02/2023, 12:57 PM

## 2023-09-02 NOTE — Progress Notes (Addendum)
[facility]                                                                               Sherry Fitzpatrick, is a 72 y.o. female, DOB - Mar 07, 1951, OZH:[id number] Admit date - 08/31/2023    Outpatient Primary MD for the patient is Sherry Hale, MD  LOS - 2  days    Brief summary    72 year old gentleman with prior history of COPD, diabetes, coronary artery disease obstructive sleep apnea, GERD presents with chest pain and shortness of breath along with some dizziness.  She was started on Antivert and Zofran to address vertigo and was being set up for further cardiac evaluation.  But on taking Antivert and Zofran patient became more sleepy confused and presented to ED with acute metabolic encephalopathy on 11/16 was found to be in decompensated heart failure hypercarbia.  MRI of the brain showed small punctate focus in right lentiform nucleus consistent with tiny infarct. She was transferred to [facility] on 11/18.   Neurology and cardiology on board   Assessment & Plan    Assessment and Plan:   Acute metabolic encephalopathy probably secondary to Zofran, meclizine, hypercarbia and CVA Slowly improving patient is more alert and oriented and answering all questions appropriately. Avoid sedating medications   Acute CVA Single punctate focus in right lentiform nucleus consistent with tiny infarct. Neurology on board and further workup for CVA pending EEG showed continuous generalized slowing no seizure activity seen. Carotid duplex, echocardiogram, MRI of the brain, fasting lipids and hemoglobin A1c ordered Therapy evaluations are pending LDL is 109 Continue with aspirin and plavix for now    Chest pain Appears to have resolved  History of coronary artery disease s/p DES S in LAD in 2021 Echocardiogram Continue with aspirin and Lipitor 40 mg daily for now   Paroxysmal atrial fibrillation Rate controlled with amiodarone 200 mg daily and Coreg 6.25 mg twice  daily Cardiology recommends cardiac monitor at the time of discharge  Anemia of chronic disease:  Hemoglobin around 10. Monitor.    Hypertension Blood pressure parameters are optimal patient currently on hydralazine 25 mg 3 times daily, Imdur 120 mg daily, amlodipine 5 mg daily and Coreg 6.25 mg twice daily.   AKI Unclear what her baseline creatinine is,  creatinine ranging from 1.1 to 2. Admitted with a creatinine of 1 this admission, improved to 1.9. continue to monitor.  Avoid nephrotoxins.    History of COPD No wheezing heard  currently on Pulmicort Brovana nebulizer  OSA Recommend outpatient follow up with pulmonology for sleep study.   Body mass index is 39.66 kg/m. Obesity Recommend outpatient with PCP .   Estimated body mass index is 39.66 kg/m as calculated from the following:   Height as of 08/28/23: 5\' 4"  (1.626 m).   Weight as of this encounter: 104.8 kg.  Code Status: full code.  DVT Prophylaxis:  SCDs Start: 08/31/23 1341   Level of Care: Level of care: Progressive Family Communication: none at bedside.   Disposition Plan:     Remains inpatient appropriate:  IV lasix.  Procedures:  Echocardiogram.   Consultants:   Cardiology Neurology   Antimicrobials:  Anti-infectives (From admission, onward)    None        Medications  Scheduled Meds:  amiodarone  200 mg Oral Daily   amLODipine  5 mg Oral Daily   arformoterol  15 mcg Nebulization BID   atorvastatin  40 mg Oral Daily   budesonide (PULMICORT) nebulizer solution  0.5 mg Nebulization BID   carvedilol  6.25 mg Oral BID WC   Chlorhexidine Gluconate Cloth  6 each Topical Daily   clopidogrel  75 mg Oral Daily   hydrALAZINE  25 mg Oral Q8H   isosorbide mononitrate  120 mg Oral Daily   mupirocin ointment  1 Application Nasal BID   pantoprazole  40 mg Oral Q1200   sertraline  25 mg Oral Daily   Continuous Infusions: PRN Meds:.    Subjective:   Sherry Fitzpatrick was seen and  examined today. Reports feeling better than yesterday. Talking to her family on the phone. Denies any chest pain or sob. Slightly nauseated.   Objective:   Vitals:   09/02/23 0336 09/02/23 0737 09/02/23 1021 09/02/23 1137  BP: 138/68 134/84    Pulse: (!) 55 (!) 55 60   Resp: 20 15    Temp: 98.8 F (37.1 C) 98.7 F (37.1 C)    TempSrc: Oral Oral  Oral  SpO2: 97% 99%  92%  Weight: 104.8 kg       Intake/Output Summary (Last 24 hours) at 09/02/2023 1139 Last data filed at 09/01/2023 2358 Gross per 24 hour  Intake 600 ml  Output 1225 ml  Net -625 ml   Filed Weights   08/31/23 1539 09/02/23 0336  Weight: 106.5 kg 104.8 kg     Exam General exam: Appears calm and comfortable  Respiratory system: Clear to auscultation. Respiratory effort normal. Cardiovascular system: S1 & S2 heard, RRR.  Gastrointestinal system: Abdomen is nondistended, soft and nontender.  Central nervous system: Alert and oriented to person and place, right sided weakness, speech slow but clear. No dysphagia.  Extremities: no edema.  Skin: No rashes,  Psychiatry:  Mood & affect appropriate.     Data Reviewed:  I have personally reviewed following labs and imaging studies   CBC Lab Results  Component Value Date   WBC 8.5 09/02/2023   RBC 3.62 (L) 09/02/2023   HGB 10.0 (L) 09/02/2023   HCT 32.4 (L) 09/02/2023   MCV 89.5 09/02/2023   MCH 27.6 09/02/2023   PLT 238 09/02/2023   MCHC 30.9 09/02/2023   RDW 16.3 (H) 09/02/2023   LYMPHSABS 1.7 08/31/2023   MONOABS 0.6 08/31/2023   EOSABS 0.0 08/31/2023   BASOSABS 0.1 08/31/2023     Last metabolic panel Lab Results  Component Value Date   NA 140 09/02/2023   K 4.2 09/02/2023   CL 102 09/02/2023   CO2 26 09/02/2023   BUN 34 (H) 09/02/2023   CREATININE 1.91 (H) 09/02/2023   GLUCOSE 107 (H) 09/02/2023   GFRNONAA 27 (L) 09/02/2023   GFRAA 60 (L) 06/09/2020   CALCIUM 8.5 (L) 09/02/2023   PHOS 3.4 07/08/2023   PROT 7.1 08/31/2023   ALBUMIN 3.3  (L) 08/31/2023   LABGLOB 3.3 08/28/2023   BILITOT 0.8 08/31/2023   ALKPHOS 53 08/31/2023   AST 19 08/31/2023   ALT 11 08/31/2023   ANIONGAP 12 09/02/2023    CBG (last 3)  Recent Labs    09/01/23 0739 09/01/23 1106 09/02/23 0814  GLUCAP 92 90 96      Coagulation Profile: No results for  input(s): "INR", "PROTIME" in the last 168 hours.   Radiology Studies: MR ANGIO HEAD WO CONTRAST  Result Date: 09/01/2023 CLINICAL DATA:  Stroke follow-up. EXAM: MRA HEAD WITHOUT CONTRAST TECHNIQUE: Angiographic images of the Circle of Willis were acquired using MRA technique without intravenous contrast. COMPARISON:  MRA head 05/28/2020 FINDINGS: Anterior circulation: The internal carotid arteries are patent from skull base to carotid termini with similar appearance of multifocal stenoses, including severe right and moderate to severe left proximal supraclinoid stenoses. ACAs and MCAs are patent without evidence of a proximal branch occlusion or flow limiting M1 stenosis. Severe bilateral A1 stenoses are again seen. There are also severe bilateral M2 stenoses which have likely mildly progressed. No aneurysm is identified. Posterior circulation: The intracranial vertebral arteries are patent to the basilar. A severe right V4 stenosis immediately distal to the origin of the right PICA is unchanged from the prior study. A mild proximal left V4 stenosis is less prominent than on the prior study. Patent PICA and SCA origins are visualized bilaterally. The basilar artery is patent and mildly small in caliber diffusely with mild irregularity but no significant focal stenosis. Posterior communicating arteries are diminutive or absent. Severe bilateral distal P1 and left P2 stenoses appear mildly progressive. No aneurysm is identified. Anatomic variants: None. IMPRESSION: Severe intracranial atherosclerosis as detailed above with mild progression from 2021. No large vessel occlusion. Electronically Signed   By: Sebastian Ache M.D.   On: 09/01/2023 17:13   VAS US CAROTID  Result Date: 09/01/2023 Carotid Arterial Duplex Study Patient Name:  ARIBELLA NAPLES Awbrey  Date of Exam:   09/01/2023 Medical Rec #: [id number]        Accession #:    [id number] Date of Birth: Sep 15, 1951         Patient Gender: F Patient Age:   35 years Exam [facility]  [facility] Procedure:      VAS US CAROTID Referring Phys: Gevena Mart --------------------------------------------------------------------------------  Indications:  CVA. Risk Factors: Hypertension, prior MI, prior CVA. Performing Technologist: Shona Simpson  Examination Guidelines: A complete evaluation includes B-mode imaging, spectral Doppler, color Doppler, and power Doppler as needed of all accessible portions of each vessel. Bilateral testing is considered an integral part of a complete examination. Limited examinations for reoccurring indications may be performed as noted.  Right Carotid Findings: +----------+--------+--------+--------+------------------+------------------+           PSV cm/sEDV cm/sStenosisPlaque DescriptionComments           +----------+--------+--------+--------+------------------+------------------+ CCA Prox  75      14                                                   +----------+--------+--------+--------+------------------+------------------+ CCA Distal83      19                                intimal thickening +----------+--------+--------+--------+------------------+------------------+ ICA Prox  93      37      1-39%   heterogenous                         +----------+--------+--------+--------+------------------+------------------+ ICA Distal109     41                                                   +----------+--------+--------+--------+------------------+------------------+  ECA       151     28                                                    +----------+--------+--------+--------+------------------+------------------+ +----------+--------+-------+--------+-------------------+           PSV cm/sEDV cmsDescribeArm Pressure (mmHG) +----------+--------+-------+--------+-------------------+ Subclavian151     0                                  +----------+--------+-------+--------+-------------------+ +---------+--------+--+--------+--+ VertebralPSV cm/s76EDV cm/s20 +---------+--------+--+--------+--+  Left Carotid Findings: +----------+--------+--------+--------+------------------+--------+           PSV cm/sEDV cm/sStenosisPlaque DescriptionComments +----------+--------+--------+--------+------------------+--------+ CCA Prox  60      12                                         +----------+--------+--------+--------+------------------+--------+ CCA Distal55      15                                         +----------+--------+--------+--------+------------------+--------+ ICA Prox  112     46      40-59%  heterogenous               +----------+--------+--------+--------+------------------+--------+ ICA Mid   93      28                                         +----------+--------+--------+--------+------------------+--------+ ICA Distal87      29                                         +----------+--------+--------+--------+------------------+--------+ ECA       118     9                                          +----------+--------+--------+--------+------------------+--------+ +----------+--------+--------+--------+-------------------+           PSV cm/sEDV cm/sDescribeArm Pressure (mmHG) +----------+--------+--------+--------+-------------------+ [id number]      0                                   +----------+--------+--------+--------+-------------------+ +---------+--------+--+--------+--+ VertebralPSV cm/s55EDV cm/s17 +---------+--------+--+--------+--+   Summary: Right  Carotid: Velocities in the right ICA are consistent with a 1-39% stenosis. Left Carotid: Velocities in the left ICA are consistent with a 40-59% stenosis. Vertebrals:  Bilateral vertebral arteries demonstrate antegrade flow. Subclavians: Normal flow hemodynamics were seen in bilateral subclavian              arteries. *See table(s) above for measurements and observations.     Preliminary    MR BRAIN WO CONTRAST  Result Date: 09/01/2023 CLINICAL DATA:  Initial evaluation for neuro deficit, stroke suspected. EXAM: MRI HEAD WITHOUT CONTRAST TECHNIQUE: Multiplanar,  multiecho pulse sequences of the brain and surrounding structures were obtained without intravenous contrast. COMPARISON:  CT from earlier the same day as well as previous MRI from 08/04/2022 FINDINGS: Brain: Cerebral volume within normal limits for age. Patchy and confluent T2/FLAIR hyperintensity involving the periventricular deep white matter both cerebral hemispheres as well as the pons, consistent with chronic small vessel ischemic disease, moderately advanced. Few scatter remote lacunar infarcts noted about the right frontal centrum semi ovale and basal ganglia. Probable tiny remote left cerebellar infarct noted. Single punctate focus of restricted diffusion seen involving the right lentiform nucleus (series 5, image 72). Corresponding signal loss on ADC map (series 6, image 24). Findings suspicious for a tiny acute ischemic infarct. No associated hemorrhage. No other evidence for acute or subacute ischemia. No other acute or chronic intracranial blood products. No mass lesion, midline shift or mass effect. No hydrocephalus or extra-axial fluid collection. Partially empty sella noted. Suprasellar region within normal limits. Vascular: Major intracranial vascular flow voids are maintained. Skull and upper cervical spine: Case of noted within normal limits. Bone marrow signal intensity normal. No scalp soft tissue abnormality. Sinuses/Orbits: Globes  and orbital soft tissues within normal limits. Paranasal sinuses are largely clear. Trace right mastoid effusion, of doubtful significance. Other: None. IMPRESSION: 1. Single punctate focus of restricted diffusion involving the right lentiform nucleus, suspicious for a tiny acute ischemic infarct. No associated hemorrhage. 2. Underlying moderately advanced chronic microvascular ischemic disease with a few scattered remote infarcts as above. Electronically Signed   By: Rise Mu M.D.   On: 09/01/2023 00:26   CT Head Wo Contrast  Result Date: 08/31/2023 CLINICAL DATA:  Mental status change, unknown cause. EXAM: CT HEAD WITHOUT CONTRAST TECHNIQUE: Contiguous axial images were obtained from the base of the skull through the vertex without intravenous contrast. RADIATION DOSE REDUCTION: This exam was performed according to the departmental dose-optimization program which includes automated exposure control, adjustment of the mA and/or kV according to patient size and/or use of iterative reconstruction technique. COMPARISON:  Head MRI 08/04/2022 FINDINGS: Brain: There is no evidence of an acute infarct, intracranial hemorrhage, mass, midline shift, or extra-axial fluid collection. Cerebral volume is within normal limits for age. Patchy hypodensities in the cerebral white matter nonspecific but compatible with moderate chronic small vessel ischemic disease. A chronic lacunar infarct is again seen in the left caudate head. Vascular: Calcified atherosclerosis at the skull base. No hyperdense vessel. Skull: No acute fracture or suspicious osseous lesion. Sinuses/Orbits: Visualized paranasal sinuses and mastoid air cells are clear. Unremarkable orbits. Other: None. IMPRESSION: 1. No evidence of acute intracranial abnormality. 2. Moderate chronic small vessel ischemic disease. Electronically Signed   By: Sebastian Ache M.D.   On: 08/31/2023 15:39   DG Chest Port 1 View  Result Date: 08/31/2023 CLINICAL DATA:   Found unresponsive EXAM: PORTABLE CHEST 1 VIEW COMPARISON:  07/05/2023 FINDINGS: Cardiomegaly with coronary stenting, stable. Interstitial coarsening also seen on prior, likely vascular congestion. No Kerley lines, effusion, air bronchogram, or pneumothorax. IMPRESSION: Cardiomegaly and vascular congestion. No change when compared to prior. Electronically Signed   By: Tiburcio Pea M.D.   On: 08/31/2023 12:22       Kathlen Mody M.D. [facility] 09/02/2023, 11:39 AM  Available via Epic secure chat 7am-7pm After 7 pm, please refer to night coverage provider listed on amion.

## 2023-09-02 NOTE — Evaluation (Signed)
Speech Language Pathology Evaluation Patient Details Name: Sherry Fitzpatrick MRN: [id number] DOB: 09/29/1951 Today's Date: 09/02/2023 Time: 7846-9629 SLP Time Calculation (min) (ACUTE ONLY): 15 min  Problem List:  Patient Active Problem List   Diagnosis Date Noted   Altered mental status 09/01/2023   Hx of non-insulin dependent diabetes mellitus 07/01/2023   History of coronary artery disease 07/01/2023   Restrictive cardiomyopathy (HCC) 07/01/2023   History of obstructive sleep apnea 07/01/2023   Obesity (BMI 30-39.9) 07/01/2023   COPD with acute exacerbation (HCC) 12/17/2022   Acute respiratory failure with hypoxia (HCC) 12/15/2022   Type 2 diabetes mellitus with hyperglycemia, with long-term current use of insulin (HCC)    Hypoalbuminemia due to protein-calorie malnutrition (HCC)    Paroxysmal atrial fibrillation (HCC) -On DAPT per neurology. Pt has not been changed to systemic anticoagulation by outpatient neurology.    Chronic systolic congestive heart failure (HCC)    Status post coronary artery stent placement    Dysphagia, post-stroke    Chronic anemia 03/27/2020   History of CVA (cerebrovascular accident) 03/27/2020   COPD (chronic obstructive pulmonary disease) (HCC) 11/22/2019   Morbid obesity (HCC) 10/29/2019   CKD stage 3b, GFR 30-44 ml/min (HCC) 10/29/2019   Tobacco abuse 07/01/2018   Diastolic heart failure (HCC) 07/01/2018   Positive D dimer    Right sided weakness 04/14/2015   Hyperlipidemia    Essential hypertension    Asthma    OSA (obstructive sleep apnea) 05/06/2013   Anxiety and depression 11/20/2011   Esophageal reflux 11/20/2011   Past Medical History:  Past Medical History:  Diagnosis Date   Acute non-Q wave non-ST elevation myocardial infarction (NSTEMI) (HCC) 05/24/2020   Acute respiratory failure with hypoxia (HCC) 11/22/2019   AKI (acute kidney injury) (HCC) 07/03/2018   Alterations of sensations, late effect of cerebrovascular disease(438.6)     Arthritis    "all over"   Backache, unspecified    Body mass index 40.0-44.9, adult (HCC)    Carpal tunnel syndrome    Cerebral thrombosis with cerebral infarction 05/29/2020   Dizziness and giddiness    Esophageal reflux    Generalized pain    Gout attack 03/28/2020   Headache    "had alot til I had pituitary tumor removed"   Heart murmur    Hypertensive encephalopathy 03/28/2020   Mild cognitive impairment 07/16/2022   NSTEMI (non-ST elevated myocardial infarction) (HCC)    Other abnormal glucose    Other and unspecified hyperlipidemia    Other malaise and fatigue    Other postprocedural status(V45.89)    Other symptoms involving cardiovascular system    Shock (HCC)    Sleep apnea    "did test; they wanted me to get a CPAP but I never did get it" (04/14/2015) Does use inhaler at night (01 2021)   Stroke (HCC) 06/2003; 02/2010   "minor; minor" ; denies residual on 04/14/2015   Unspecified disorder of the pituitary gland and its hypothalamic control    Unspecified essential hypertension    Unspecified visual disturbance    Past Surgical History:  Past Surgical History:  Procedure Laterality Date   CARPAL TUNNEL RELEASE Left ~ 2014   CORONARY STENT INTERVENTION N/A 05/24/2020   Procedure: CORONARY STENT INTERVENTION;  Surgeon: Corky Crafts, MD;  Location: [facility];  Service: Cardiovascular;  Laterality: N/A;   JOINT REPLACEMENT     LEFT HEART CATH AND CORONARY ANGIOGRAPHY N/A 05/24/2020   Procedure: LEFT HEART CATH AND CORONARY ANGIOGRAPHY;  Surgeon:  Rinaldo Cloud, MD;  Location: [facility];  Service: Cardiovascular;  Laterality: N/A;   TOTAL HIP ARTHROPLASTY Right 11/06/2010   TRANSPHENOIDAL / TRANSNASAL HYPOPHYSECTOMY / RESECTION PITUITARY TUMOR  06/13/2012   TUBAL LIGATION  1974   HPI:  Sherry Fitzpatrick is a 72 yo female presenting to ED 11/16 with AMS. Admitted with acute metabolic encephalopathy. MRI Brain with small punctate focus in R lentiform  nucleus. Previously seen by SLP 05/2020 with short-term memory impairments and developing awareness. PMH includes COPD, T2DM, CAD, CHF, A-fib, CKD 3b, GERD, OSA, chronic anemia, HLD   Assessment / Plan / Recommendation Clinical Impression  Pt reports that her cognition feels different than baseline, although acknowledges that her daughter provides signficant assistance with ADLs at home. Pt presents with deficits related to memory (retrieval > storage), sustained attention, and problem solving. She appears to have good awareness of deficits as well as insight into the assistance she needs and is frustrated by it. Overall, feel that pt is experiencing acute on chronic cognitive deficits and may benefit from ongoing SLP f/u acutely and upon d/c with [facility] SLP.    SLP Assessment  SLP Recommendation/Assessment: Patient needs continued Speech Lanaguage Pathology Services SLP Visit Diagnosis: Cognitive communication deficit (R41.841)    Recommendations for follow up therapy are one component of a multi-disciplinary discharge planning process, led by the attending physician.  Recommendations may be updated based on patient status, additional functional criteria and insurance authorization.    Follow Up Recommendations  Home health SLP    Assistance Recommended at Discharge  Frequent or constant Supervision/Assistance  Functional Status Assessment Patient has had a recent decline in their functional status and demonstrates the ability to make significant improvements in function in a reasonable and predictable amount of time.  Frequency and Duration min 2x/week  1 week      SLP Evaluation Cognition  Overall Cognitive Status: History of cognitive impairments - at baseline Arousal/Alertness: Awake/alert Orientation Level: Oriented X4 Attention: Sustained Sustained Attention: Impaired Sustained Attention Impairment: Verbal basic Memory: Impaired Memory Impairment: Retrieval deficit Awareness:  Appears intact Problem Solving: Impaired Problem Solving Impairment: Verbal basic       Comprehension  Auditory Comprehension Overall Auditory Comprehension: Appears within functional limits for tasks assessed    Expression Expression Primary Mode of Expression: Verbal Verbal Expression Overall Verbal Expression: Appears within functional limits for tasks assessed Written Expression Dominant Hand: Left   Oral / Motor  Oral Motor/Sensory Function Overall Oral Motor/Sensory Function: Within functional limits Motor Speech Overall Motor Speech: Appears within functional limits for tasks assessed            Gwynneth Aliment, M.A., CF-SLP Speech Language Pathology, Acute Rehabilitation Services  Secure Chat preferred [phone]  09/02/2023, 5:10 PM

## 2023-09-02 NOTE — Evaluation (Signed)
Physical Therapy Evaluation Patient Details Name: Sherry Fitzpatrick MRN: [id number] DOB: [birth year] Today's Date: 09/02/2023  History of Present Illness  72 year old with prior history of COPD, diabetes, coronary artery disease obstructive sleep apnea, GERD presents with chest pain and shortness of breath along with some dizziness.  She was started on Antivert and Zofran to address vertigo and was being set up for further cardiac evaluation.  But on taking Antivert and Zofran patient became more sleepy confused and presented to ED with acute metabolic encephalopathy on 11/16 was found to be in decompensated heart failure hypercarbia.  MRI of the brain showed small punctate focus in right lentiform nucleus consistent with tiny infarct   Clinical Impression  Pt admitted with above. Dtr, Candance present and provided accurate PLOF and home set up. Dtr reports pt to be near baseline from cognitive stand point but remains a little off. Pt with delayed processing, decreased attn span, impaired sequencing, noted intentional tremors when using UEs, short shuffled gait pattern requiring minA for walker management and safe ambulation. Pt to benefit from HHPT upon d/c to address above deficits and return to mod I level of function. Acute PT to cont to follow.        If plan is discharge home, recommend the following: A little help with walking and/or transfers;A little help with bathing/dressing/bathroom;Help with stairs or ramp for entrance;Supervision due to cognitive status   Can travel by private vehicle        Equipment Recommendations None recommended by PT  Recommendations for Other Services       Functional Status Assessment Patient has had a recent decline in their functional status and demonstrates the ability to make significant improvements in function in a reasonable and predictable amount of time.     Precautions / Restrictions Precautions Precautions: Fall Restrictions Weight Bearing  Restrictions: No      Mobility  Bed Mobility Overal bed mobility: Needs Assistance Bed Mobility: Supine to Sit     Supine to sit: Min assist, HOB elevated, Used rails     General bed mobility comments: pt reported dizziness upon sitting EOB, quickly resolving, increased time and effort required, use of rail. Able to scoot hips to EOB with min verbal cues to initiate. O2 SATs dropping 87-88% on RA and pt instructed in deep, pursed lip breathing to recover to >90% in less than 1 minute    Transfers Overall transfer level: Needs assistance Equipment used: Rolling walker (2 wheels) Transfers: Sit to/from Stand, Bed to chair/wheelchair/BSC Sit to Stand: Min assist, +2 physical assistance           General transfer comment: min A +2, progressing to min A    Ambulation/Gait Ambulation/Gait assistance: Min assist Gait Distance (Feet): 10 Feet (x1, 15x1) Assistive device: Rolling walker (2 wheels) Gait Pattern/deviations: Step-to pattern, Trunk flexed Gait velocity: dec Gait velocity interpretation: <1.31 ft/sec, indicative of household ambulator   General Gait Details: pt with slow, short step height and length, directional verbal cues, mild tremors initially  Stairs            Wheelchair Mobility     Tilt Bed    Modified Rankin (Stroke Patients Only)       Balance Overall balance assessment: Needs assistance Sitting-balance support: No upper extremity supported Sitting balance-Leahy Scale: Good Sitting balance - Comments: pt reported dizziness upon sitting EOB, quickly resolving   Standing balance support: Bilateral upper extremity supported, No upper extremity supported, During functional activity Standing balance-Leahy Scale: Fair  Standing balance comment: Pt used RW to walk to sink and stood at sink for grooming/hygiene tasks CGA with chair behind he rfor safety                             Pertinent Vitals/Pain Pain Assessment Pain  Assessment: 0-10 Pain Score: 5  Pain Location: R LE/hip Pain Descriptors / Indicators: Aching    Home Living Family/patient expects to be discharged to:: Private residence Living Arrangements: Children Available Help at Discharge: Family;Available 24 hours/day Type of Home: House Home Access: Stairs to enter Entrance Stairs-Rails:  (pole on each side of steps) Entrance Stairs-Number of Steps: 1   Home Layout: One level Home Equipment: Agricultural consultant (2 wheels);Rollator (4 wheels);Grab bars - tub/shower;BSC/3in1 Additional Comments: lives with her daughter/family    Prior Function Prior Level of Function : Independent/Modified Independent             Mobility Comments: has been using RW in home, doesnt go out in community but will use rollator when walking to the car ADLs Comments: Pt reports independent in ADLs and that daughter assists with IADLs; difficulty donning socks     Extremity/Trunk Assessment   Upper Extremity Assessment Upper Extremity Assessment: Defer to OT evaluation    Lower Extremity Assessment Lower Extremity Assessment: Overall WFL for tasks assessed    Cervical / Trunk Assessment Cervical / Trunk Assessment: Normal  Communication   Communication Communication: No apparent difficulties (delayed response time)  Cognition Arousal: Alert Behavior During Therapy: WFL for tasks assessed/performed Overall Cognitive Status: Impaired/Different from baseline Area of Impairment: Memory, Safety/judgement, Awareness, Problem solving                     Memory: Decreased short-term memory   Safety/Judgement: Decreased awareness of safety   Problem Solving: Slow processing General Comments: pt's daughter reports that pt is almost near cognitive baseline. pt with noted impaired sequencing of simple tasks ie. brushing teeth        General Comments General comments (skin integrity, edema, etc.): Sp O2 > 88% on RA, quickly > 90% when told to take a  deep breath    Exercises     Assessment/Plan    PT Assessment Patient needs continued PT services  PT Problem List Decreased strength;Decreased activity tolerance;Decreased balance;Decreased mobility;Decreased coordination;Decreased cognition       PT Treatment Interventions DME instruction;Gait training;Stair training;Functional mobility training;Therapeutic activities    PT Goals (Current goals can be found in the Care Plan section)  Acute Rehab PT Goals Patient Stated Goal: didn't state PT Goal Formulation: With patient/family Time For Goal Achievement: 09/16/23 Potential to Achieve Goals: Good    Frequency Min 1X/week     Co-evaluation PT/OT/SLP Co-Evaluation/Treatment: Yes Reason for Co-Treatment: For patient/therapist safety;To address functional/ADL transfers PT goals addressed during session: Mobility/safety with mobility OT goals addressed during session: ADL's and self-care       AM-PAC PT "6 Clicks" Mobility  Outcome Measure Help needed turning from your back to your side while in a flat bed without using bedrails?: A Little Help needed moving from lying on your back to sitting on the side of a flat bed without using bedrails?: A Little Help needed moving to and from a bed to a chair (including a wheelchair)?: A Little Help needed standing up from a chair using your arms (e.g., wheelchair or bedside chair)?: A Little Help needed to walk in hospital room?: A Little  Help needed climbing 3-5 steps with a railing? : A Little 6 Click Score: 18    End of Session Equipment Utilized During Treatment: Gait belt Activity Tolerance: Patient tolerated treatment well Patient left: in chair;with call bell/phone within reach;with chair alarm set;with family/visitor present Nurse Communication: Mobility status PT Visit Diagnosis: Unsteadiness on feet (R26.81)    Time: 2440-1027 PT Time Calculation (min) (ACUTE ONLY): 41 min   Charges:   PT Evaluation $PT Eval  Moderate Complexity: 1 Mod   PT General Charges $$ ACUTE PT VISIT: 1 Visit         Lewis Shock, PT, DPT Acute Rehabilitation Services Secure chat preferred Office #: [phone]   Iona Hansen 09/02/2023, 2:04 PM

## 2023-09-02 NOTE — Progress Notes (Signed)
   09/02/23 2007  BiPAP/CPAP/SIPAP  Reason BIPAP/CPAP not in use Non-compliant  BiPAP/CPAP /SiPAP Vitals  Pulse Rate (!) 57  Resp 16  SpO2 99 %  Bilateral Breath Sounds Clear;Diminished  MEWS Score/Color  MEWS Score 0  MEWS Score Color Green   Pt. Refused cpap

## 2023-09-02 NOTE — Progress Notes (Addendum)
STROKE TEAM PROGRESS NOTE   BRIEF HPI [name] is a 72 y.o. female with history of CAD s/p stenting, atrial fibrillation (not on anticoagulation), CKD3, COPD, prior acute hypoxic respiratory failure, CHF exacerbation, AKI, strokes in 2004, 2011 and 2021, morbid obesity, gout, hypertensive encephalopathy, mild cognitive impairment, HLD and sleep apnea who presented to the ED this morning after she was found unresponsive by family at 10:30 AM on 08/31/2023 and admitted for altered mental status. CCM noticed possible right arm weakness on exam and neurology was consulted for further work-up.    SIGNIFICANT HOSPITAL EVENTS MRI:  Single punctate focus of restricted diffusion involving the right lentiform nucleus, suspicious for a tiny acute ischemic infarct. No associated hemorrhage. Underlying moderately advanced chronic microvascular ischemic disease with a few scattered remote infarcts   INTERIM HISTORY/SUBJECTIVE Patients daughter bedside and contributes to history. Patient was recently started on meclizine for vertigo symptoms. Daughter noticed patient more confused and drowsy. Drowsiness worsened after a few days of taking the meds. When the daughter checked on patient 11/16 morning, she was groggy and unresponsive. Patient more responsive and alert this morning poor daughter. Right sided weakness was new and is improving   OBJECTIVE  CBC    Component Value Date/Time   WBC 8.5 09/02/2023 0308   RBC 3.62 (L) 09/02/2023 0308   HGB 10.0 (L) 09/02/2023 0308   HGB 12.1 08/28/2023 1006   HCT 32.4 (L) 09/02/2023 0308   HCT 37.3 08/28/2023 1006   PLT 238 09/02/2023 0308   PLT 294 08/28/2023 1006   MCV 89.5 09/02/2023 0308   MCV 88 08/28/2023 1006   MCH 27.6 09/02/2023 0308   MCHC 30.9 09/02/2023 0308   RDW 16.3 (H) 09/02/2023 0308   RDW 15.5 (H) 08/28/2023 1006   LYMPHSABS 1.7 08/31/2023 1146   LYMPHSABS 2.0 08/28/2023 1006   MONOABS 0.6 08/31/2023 1146   EOSABS 0.0 08/31/2023  1146   EOSABS 0.3 08/28/2023 1006   BASOSABS 0.1 08/31/2023 1146   BASOSABS 0.1 08/28/2023 1006    BMET    Component Value Date/Time   NA 140 09/02/2023 0308   NA 139 08/28/2023 1006   K 4.2 09/02/2023 0308   CL 102 09/02/2023 0308   CO2 26 09/02/2023 0308   GLUCOSE 107 (H) 09/02/2023 0308   BUN 34 (H) 09/02/2023 0308   BUN 18 08/28/2023 1006   CREATININE 1.91 (H) 09/02/2023 0308   CALCIUM 8.5 (L) 09/02/2023 0308   EGFR 44 (L) 08/28/2023 1006   GFRNONAA 27 (L) 09/02/2023 0308    IMAGING past 24 hours MR ANGIO HEAD WO CONTRAST  Result Date: 09/01/2023 CLINICAL DATA:  Stroke follow-up. EXAM: MRA HEAD WITHOUT CONTRAST TECHNIQUE: Angiographic images of the Circle of Willis were acquired using MRA technique without intravenous contrast. COMPARISON:  MRA head 05/28/2020 FINDINGS: Anterior circulation: The internal carotid arteries are patent from skull base to carotid termini with similar appearance of multifocal stenoses, including severe right and moderate to severe left proximal supraclinoid stenoses. ACAs and MCAs are patent without evidence of a proximal branch occlusion or flow limiting M1 stenosis. Severe bilateral A1 stenoses are again seen. There are also severe bilateral M2 stenoses which have likely mildly progressed. No aneurysm is identified. Posterior circulation: The intracranial vertebral arteries are patent to the basilar. A severe right V4 stenosis immediately distal to the origin of the right PICA is unchanged from the prior study. A mild proximal left V4 stenosis is less prominent than on the prior study.  Patent PICA and SCA origins are visualized bilaterally. The basilar artery is patent and mildly small in caliber diffusely with mild irregularity but no significant focal stenosis. Posterior communicating arteries are diminutive or absent. Severe bilateral distal P1 and left P2 stenoses appear mildly progressive. No aneurysm is identified. Anatomic variants: None.  IMPRESSION: Severe intracranial atherosclerosis as detailed above with mild progression from 2021. No large vessel occlusion. Electronically Signed   By: Sebastian Ache M.D.   On: 09/01/2023 17:13    Vitals:   09/02/23 0336 09/02/23 0737 09/02/23 1021 09/02/23 1137  BP: 138/68 134/84  117/67  Pulse: (!) 55 (!) 55 60   Resp: 20 15    Temp: 98.8 F (37.1 C) 98.7 F (37.1 C)  98.9 F (37.2 C)  TempSrc: Oral Oral  Oral  SpO2: 97% 99%  92%  Weight: 104.8 kg        PHYSICAL EXAM General:  Alert, obese elderly African-American lady in no acute distress Psych:  Mood and affect appropriate for situation CV: Regular rate and rhythm on monitor Respiratory:  Regular, unlabored respirations on room air GI: Abdomen soft and nontender   NEURO:  Mental Status: AA&O to person,  patient is unable to give clear and coherent history, requests help from daughter bedside.  Diminished attention with short-term recall.  Poor insight into her illness. Speech/Language: speech is without dysarthria or aphasia.  Cranial Nerves:  II: PERRL. Visual fields full.  III, IV, VI: EOMI. Eyelids elevate symmetrically.  V: Sensation is intact to light touch and symmetrical to face.  VII: Face is symmetrical resting and smiling VIII: hearing intact to voice. IX, X: Palate elevates symmetrically. Phonation is normal.  ZO:[id number] shrug 5/5. XII: tongue is midline without fasciculations. Motor: Grossly moves all extremities, 4/5 RUE w/ drift and 3/5 RLE w/ drift.  Tone: is normal and bulk is normal Sensation- Intact to light touch bilaterally. Extinction absent to light touch to DSS.   Coordination: FTN intact bilaterally,mildly slowing in RUE and tremor noted HKS: Right side drift noted  Gait- deferred  NIHSS: 2  ASSESSMENT/PLAN  Tiny acute right lentiform nucleus ischemic infarct likely clinically silent.  Transient right-sided weakness likely left hemispheric TIA from small vessel disease Etiology:  Small  vessel disease. CT head No acute abnormality. Chronic microvascular disease.   MRI  single punctate focus of restricted diffusion involving the right lentiform nucleus, suspicious for a tiny acute ischemic infarct. No associated hemorrhage. Underlying moderately advanced chronic microvascular ischemic disease with a few scattered remote infarcts    MR Angio No LVO, severe intracranial atherosclerosis w/ mild progression  2D Echo pending  Carotid Duplex L ICA w/ 40-59% stenosis and R ICA w/ 1-39% stenosis  LDL 109 HgbA1c 5.5 EEG negative for seizure, generalized slowing  VTE prophylaxis - SCDs aspirin 81 mg daily and clopidogrel 75 mg daily prior to admission, start aspirin 81 mg daily and Brilinta (ticagrelor) 90 mg bid tomorrow and going forward  Therapy recommendations:  Pending Disposition:  Pending   Hx of Stroke/TIA Prior history of stroke, cerebral thrombosis w/ infarction 05/29/2020 Home regimen was ASA 81 mg daily and Plavix 75 mg daily Start aspirin 81 mg daily and Brilinta (ticagrelor) 90 mg bid tomorrow and going forward  Paroxsymal atrial fibrillation:  Home medications: Amiodarone 200 mg daily and carvedilol 12.5 mg BID   Continue home medication  Cardiology recommended live cardiac monitor at time of discharge   Hypertension Home meds:  Amlodipine 5 mg daily, carvedilol 12.5  mg BID, Hydralazine 25 mg q8h, imdur 120 mg daily  Stable Normotensive BP goal  Continue meds   Hyperlipidemia Home meds:  Lipitor 40 mg resumed in hospital LDL 109, goal < 70 Continue statin at discharge  Diabetes type II Controlled Home meds:  None HgbA1c 5.5, goal < 7.0 Recommend close follow-up with PCP for better DM controlled   Other Stroke Risk Factors Obesity, Body mass index is 39.66 kg/m., BMI >/= 30 associated with increased stroke risk, recommend weight loss, diet and exercise as appropriate Coronary artery disease s/p DES: cardiology considering cardiac cath if renal function  improves  Chronic Diastolic congestive heart failure:  Home meds: Amlodipine 5 mg daily, carvedilol 12.5 mg BID, Hydralazine 25 mg q8h, imdur 120 mg daily  Continue home meds  BNP: 377 on admission >> 275 today   OSA, no CPAP, recommend outpatient sleep study    Other Active Problems CKD 3B Cr Stable at 1.91 AM   Hospital day # 2  I have personally obtained history,examined this patient, reviewed notes, independently viewed imaging studies, participated in medical decision making and plan of care.ROS completed by me personally and pertinent positives fully documented  I have made any additions or clarifications directly to the above note. Agree with note above.  Patient presented with altered mental status was hypercarbic with some right-sided weakness and appears to be improving.  MRI shows small tiny right punctate basal ganglia lacunar infarct.  Suspect she had left hemispheric TIA in setting of hypercarbia and encephalopathy and seems to be improving.  Recommend change aspirin and Plavix to aspirin and Brilinta if patient can afford it.  Continue ongoing stroke workup.  Aggressive risk factor modification.  Mobilize out of bed.  From discussion with the patient and daughter at the bedside and answered questions.  Greater than 50% time during this 35-minute visit was spent in counseling and coordination of care and discussion patient care team answering questions.  Delia Heady, MD Medical Director [facility] Pager: [phone] 09/02/2023 2:56 PM  To contact Stroke Continuity provider, please refer to [email or web address]. After hours, contact General Neurology

## 2023-09-02 NOTE — Progress Notes (Addendum)
Echo attempted. MD currently w/ pt. Will attempt again later.

## 2023-09-02 NOTE — TOC Benefit Eligibility Note (Signed)
Patient Product/process development scientist completed.    The patient is insured through [employer]. Patient has Medicare and is not eligible for a copay card, but may be able to apply for patient assistance, if available.    Ran test claim for Brilinta 90 mg and the current 30 day co-pay is $0.00.   This test claim was processed through [facility]- copay amounts may vary at other pharmacies due to pharmacy/plan contracts, or as the patient moves through the different stages of their insurance plan.     Roland Earl, CPHT Pharmacy Technician III Certified Patient Advocate [facility] Patient Advocate Team Direct Number: [phone]  Fax: [phone]

## 2023-09-03 ENCOUNTER — Other Ambulatory Visit: Payer: Self-pay

## 2023-09-03 ENCOUNTER — Encounter (HOSPITAL_COMMUNITY): Payer: Self-pay | Admitting: Pulmonary Disease

## 2023-09-03 DIAGNOSIS — G4733 Obstructive sleep apnea (adult) (pediatric): Secondary | ICD-10-CM | POA: Diagnosis not present

## 2023-09-03 DIAGNOSIS — R4182 Altered mental status, unspecified: Secondary | ICD-10-CM | POA: Diagnosis not present

## 2023-09-03 DIAGNOSIS — D649 Anemia, unspecified: Secondary | ICD-10-CM | POA: Diagnosis not present

## 2023-09-03 DIAGNOSIS — I25118 Atherosclerotic heart disease of native coronary artery with other forms of angina pectoris: Secondary | ICD-10-CM | POA: Diagnosis not present

## 2023-09-03 DIAGNOSIS — I10 Essential (primary) hypertension: Secondary | ICD-10-CM | POA: Diagnosis not present

## 2023-09-03 LAB — CBC
HCT: 32.7 % — ABNORMAL LOW (ref 36.0–46.0)
Hemoglobin: 10.6 g/dL — ABNORMAL LOW (ref 12.0–15.0)
MCH: 28.5 pg (ref 26.0–34.0)
MCHC: 32.4 g/dL (ref 30.0–36.0)
MCV: 87.9 fL (ref 80.0–100.0)
Platelets: 272 10*3/uL (ref 150–400)
RBC: 3.72 MIL/uL — ABNORMAL LOW (ref 3.87–5.11)
RDW: 16.2 % — ABNORMAL HIGH (ref 11.5–15.5)
WBC: 8 10*3/uL (ref 4.0–10.5)
nRBC: 0 % (ref 0.0–0.2)

## 2023-09-03 LAB — BASIC METABOLIC PANEL
Anion gap: 9 (ref 5–15)
BUN: 27 mg/dL — ABNORMAL HIGH (ref 8–23)
CO2: 29 mmol/L (ref 22–32)
Calcium: 8.9 mg/dL (ref 8.9–10.3)
Chloride: 101 mmol/L (ref 98–111)
Creatinine, Ser: 1.72 mg/dL — ABNORMAL HIGH (ref 0.44–1.00)
GFR, Estimated: 31 mL/min — ABNORMAL LOW (ref >60)
Glucose, Bld: 101 mg/dL — ABNORMAL HIGH (ref 70–99)
Potassium: 3.7 mmol/L (ref 3.5–5.1)
Sodium: 139 mmol/L (ref 135–145)

## 2023-09-03 MED ORDER — ENOXAPARIN SODIUM 60 MG/0.6ML IJ SOSY
50.0000 mg | PREFILLED_SYRINGE | INTRAMUSCULAR | Status: DC
  Administered 2023-09-03 – 2023-09-04 (×2): 50 mg via SUBCUTANEOUS
  Filled 2023-09-03 (×3): qty 0.6

## 2023-09-03 MED ORDER — DIPHENHYDRAMINE HCL 50 MG/ML IJ SOLN: 50.0000 mg | Freq: Once | INTRAMUSCULAR | Status: DC

## 2023-09-03 MED ORDER — DIPHENHYDRAMINE HCL 25 MG PO CAPS: 50.0000 mg | ORAL_CAPSULE | Freq: Once | ORAL | Status: DC

## 2023-09-03 MED ORDER — PREDNISONE 10 MG PO TABS: 50.0000 mg | ORAL_TABLET | Freq: Four times a day (QID) | ORAL | Status: DC

## 2023-09-03 NOTE — Plan of Care (Signed)
  Problem: Education: Goal: Knowledge of General Education information will improve Description: Including pain rating scale, medication(s)/side effects and non-pharmacologic comfort measures Outcome: Progressing   Problem: Health Behavior/Discharge Planning: Goal: Ability to manage health-related needs will improve Outcome: Progressing   Problem: Clinical Measurements: Goal: Ability to maintain clinical measurements within normal limits will improve Outcome: Progressing Goal: Will remain free from infection Outcome: Progressing Goal: Diagnostic test results will improve Outcome: Progressing Goal: Respiratory complications will improve Outcome: Progressing Goal: Cardiovascular complication will be avoided Outcome: Progressing   Problem: Activity: Goal: Risk for activity intolerance will decrease Outcome: Progressing   Problem: Nutrition: Goal: Adequate nutrition will be maintained Outcome: Progressing   Problem: Coping: Goal: Level of anxiety will decrease Outcome: Progressing   Problem: Elimination: Goal: Will not experience complications related to bowel motility Outcome: Progressing Goal: Will not experience complications related to urinary retention Outcome: Progressing   Problem: Safety: Goal: Ability to remain free from injury will improve Outcome: Progressing   Problem: Skin Integrity: Goal: Risk for impaired skin integrity will decrease Outcome: Progressing   Problem: Education: Goal: Knowledge of disease or condition will improve Outcome: Progressing Goal: Knowledge of secondary prevention will improve (MUST DOCUMENT ALL) Outcome: Progressing Goal: Knowledge of patient specific risk factors will improve Loraine Leriche N/A or DELETE if not current risk factor) Outcome: Progressing   Problem: Ischemic Stroke/TIA Tissue Perfusion: Goal: Complications of ischemic stroke/TIA will be minimized Outcome: Progressing   Problem: Coping: Goal: Will verbalize positive  feelings about self Outcome: Progressing Goal: Will identify appropriate support needs Outcome: Progressing   Problem: Self-Care: Goal: Ability to participate in self-care as condition permits will improve Outcome: Progressing Goal: Verbalization of feelings and concerns over difficulty with self-care will improve Outcome: Progressing Goal: Ability to communicate needs accurately will improve Outcome: Progressing   Problem: Nutrition: Goal: Risk of aspiration will decrease Outcome: Progressing Goal: Dietary intake will improve Outcome: Progressing   Problem: Education: Goal: Knowledge of disease or condition will improve Outcome: Progressing Goal: Knowledge of secondary prevention will improve (MUST DOCUMENT ALL) Outcome: Progressing Goal: Knowledge of patient specific risk factors will improve Loraine Leriche N/A or DELETE if not current risk factor) Outcome: Progressing   Problem: Nutrition: Goal: Risk of aspiration will decrease Outcome: Progressing Goal: Dietary intake will improve Outcome: Progressing

## 2023-09-03 NOTE — TOC Progression Note (Signed)
Transition of Care [facility]) - Progression Note    Patient Details  Name: ANNALECIA DERIAN MRN: [id number] Date of Birth: 1951-08-28  [facility] CM/SW Contact  Leone Haven, RN Phone Number: 09/03/2023, 1:35 PM  Clinical Narrative:    NCM spoke with [place] with [facility] she states yes they can go ahead and accept referral for HHPT, HHOT, HHST.  Soc will begin 24 to 48 hrs post dc.   Expected Discharge Plan: Home w Home Health Services Barriers to Discharge: Continued Medical Work up  Expected Discharge Plan and Services   Discharge Planning Services: CM Consult Post Acute Care Choice: Home Health Living arrangements for the past 2 months: Single Family Home                                       Social Determinants of Health (SDOH) Interventions SDOH Screenings   Food Insecurity: No Food Insecurity (09/03/2023)  Housing: Low Risk  (09/03/2023)  Transportation Needs: No Transportation Needs (09/03/2023)  Utilities: Not At Risk (09/03/2023)  Tobacco Use: Low Risk  (08/28/2023)    Readmission Risk Interventions    07/03/2023   11:10 AM 12/18/2022    2:36 PM  Readmission Risk Prevention Plan  Transportation Screening Complete Complete  PCP or Specialist Appt within 5-7 Days  Complete  PCP or Specialist Appt within 3-5 Days Complete   Home Care Screening  Complete  Medication Review (RN CM)  Complete  HRI or Home Care Consult Complete   Social Work Consult for Recovery Care Planning/Counseling Complete   Palliative Care Screening Not Applicable   Medication Review Oceanographer) Referral to Pharmacy

## 2023-09-03 NOTE — Progress Notes (Signed)
Nurse requested Mobility Specialist to perform oxygen saturation test with pt which includes removing pt from oxygen both at rest and while ambulating.  Below are the results from that testing.     Patient Saturations on Room Air at Rest = spO2 96%  Patient Saturations on Room Air while Ambulating = sp02 93% .    At end of testing pt left in room on 0  Liters of oxygen.  Reported results to nurse.   D'Vante Earlene Plater Mobility Specialist Please contact via Special educational needs teacher or Rehab office at [phone]

## 2023-09-03 NOTE — Care Management Important Message (Signed)
Important Message  Patient Details  Name: Sherry Fitzpatrick MRN: [id number] Date of Birth: 23-Oct-1950   Important Message Given:  Yes - Medicare IM     Dorena Bodo 09/03/2023, 1:26 PM

## 2023-09-03 NOTE — Plan of Care (Signed)
  Problem: Education: Goal: Knowledge of General Education information will improve Description: Including pain rating scale, medication(s)/side effects and non-pharmacologic comfort measures Outcome: Progressing   Problem: Clinical Measurements: Goal: Will remain free from infection Outcome: Progressing Goal: Diagnostic test results will improve Outcome: Progressing   Problem: Activity: Goal: Risk for activity intolerance will decrease Outcome: Progressing   Problem: Nutrition: Goal: Adequate nutrition will be maintained Outcome: Progressing   Problem: Coping: Goal: Level of anxiety will decrease Outcome: Progressing   Problem: Pain Management: Goal: General experience of comfort will improve Outcome: Progressing

## 2023-09-03 NOTE — Progress Notes (Signed)
Rounding Note    Patient Name: Sherry Fitzpatrick Date of Encounter: 09/03/2023  [facility] Cardiologist: Thomasene Ripple, DO   Subjective   No chest pain today. No dyspnea.   Inpatient Medications    Scheduled Meds:  amiodarone  200 mg Oral Daily   amLODipine  5 mg Oral Daily   arformoterol  15 mcg Nebulization BID   atorvastatin  40 mg Oral Daily   budesonide (PULMICORT) nebulizer solution  0.5 mg Nebulization BID   carvedilol  6.25 mg Oral BID WC   Chlorhexidine Gluconate Cloth  6 each Topical Daily   hydrALAZINE  25 mg Oral Q8H   isosorbide mononitrate  120 mg Oral Daily   mupirocin ointment  1 Application Nasal BID   pantoprazole  40 mg Oral Q1200   sertraline  25 mg Oral Daily   ticagrelor  90 mg Oral BID   Continuous Infusions:  PRN Meds: acetaminophen, melatonin, oxyCODONE, polyethylene glycol, prochlorperazine   Vital Signs    Vitals:   09/03/23 0409 09/03/23 0556 09/03/23 0738 09/03/23 0740  BP: (!) 137/51 (!) 149/56    Pulse: 61  66   Resp: 20  20   Temp: 98.8 F (37.1 C)     TempSrc: Oral     SpO2: 92%  97% 97%  Weight: 105.3 kg       Intake/Output Summary (Last 24 hours) at 09/03/2023 0810 Last data filed at 09/03/2023 0410 Gross per 24 hour  Intake 710 ml  Output 600 ml  Net 110 ml      09/03/2023    4:09 AM 09/02/2023    3:36 AM 08/31/2023    3:39 PM  Last 3 Weights  Weight (lbs) 232 lb 3 oz 231 lb 0.7 oz 234 lb 12.6 oz  Weight (kg) 105.32 kg 104.8 kg 106.5 kg      Telemetry    Sinus - Personally Reviewed  ECG    No am EKG - Personally Reviewed  Physical Exam   General: Obese female in NAD.  HEENT: OP clear, mucus membranes moist  SKIN: warm, dry. No rashes. Neuro: No focal deficits  Musculoskeletal: Muscle strength 5/5 all ext  Psychiatric: Mood and affect normal  Neck: No JVD Lungs:Clear bilaterally, no wheezes, rhonci, crackles Cardiovascular: Regular rate and rhythm. Systolic murmur Abdomen:Soft. Bowel  sounds present. Non-tender.  Extremities: No lower extremity edema.  Labs    High Sensitivity Troponin:   Recent Labs  Lab 08/31/23 1146 08/31/23 1455  TROPONINIHS 86* 183*     Chemistry Recent Labs  Lab 08/28/23 1006 08/28/23 1006 08/31/23 1146 08/31/23 1151 08/31/23 1222 09/01/23 0658 09/02/23 0308  NA 139  --  139   < > 140 141 140  K 4.2  --  5.4*   < > 4.9 4.0 4.2  CL 101  --  103  --  105 104 102  CO2 22  --  27  --   --  29 26  GLUCOSE 142*   < > 138*  --  135* 97 107*  BUN 18   < > 27*  --  30* 28* 34*  CREATININE 1.29*  --  2.07*  --  2.00* 1.90* 1.91*  CALCIUM 9.2  --  8.7*  --   --  8.8* 8.5*  MG 2.1  --   --   --   --   --   --   PROT 7.2  --  7.1  --   --   --   --  ALBUMIN 3.9  --  3.3*  --   --   --   --   AST 15  --  19  --   --   --   --   ALT 13  --  11  --   --   --   --   ALKPHOS 82  --  53  --   --   --   --   BILITOT 0.2  --  0.8  --   --   --   --   GFRNONAA  --   --  25*  --   --  27* 27*  ANIONGAP  --   --  9  --   --  8 12   < > = values in this interval not displayed.    Lipids  Recent Labs  Lab 08/28/23 1006 09/02/23 0308  CHOL 197 159  TRIG 178* 97  HDL 34* 31*  LABVLDL 32  --   LDLCALC 131* 161*  CHOLHDL 5.8* 5.1    Hematology Recent Labs  Lab 08/31/23 1146 08/31/23 1151 08/31/23 1222 09/01/23 0658 09/02/23 0308  WBC 8.0  --   --  6.6 8.5  RBC 3.83*  --   --  3.77* 3.62*  HGB 11.1*   < > 12.6 10.8* 10.0*  HCT 35.3*   < > 37.0 34.0* 32.4*  MCV 92.2  --   --  90.2 89.5  MCH 29.0  --   --  28.6 27.6  MCHC 31.4  --   --  31.8 30.9  RDW 16.8*  --   --  16.6* 16.3*  PLT 269  --   --  228 238   < > = values in this interval not displayed.   Thyroid  Recent Labs  Lab 08/31/23 1455  TSH 4.530*    BNP Recent Labs  Lab 08/31/23 1146 09/01/23 0658  BNP 377.4* 275.5*    DDimer No results for input(s): "DDIMER" in the last 168 hours.   Radiology    ECHOCARDIOGRAM COMPLETE  Result Date: 09/02/2023     ECHOCARDIOGRAM REPORT   Patient Name:   Sherry Fitzpatrick Ontko Date of Exam: 09/02/2023 Medical Rec #:  [id number]       Height:       64.0 in Accession #:    [id number]      Weight:       231.0 lb Date of Birth:  Jan 09, 1951        BSA:          2.080 m Patient Age:    72 years        BP:           117/67 mmHg Patient Gender: F               HR:           57 bpm. Exam Location:  Inpatient Procedure: 2D Echo, Color Doppler and Cardiac Doppler Indications:    Stroke  History:        Patient has prior history of Echocardiogram examinations, most                 recent 12/16/2022. Previous Myocardial Infarction and CAD, Stroke                 and COPD, Aortic Valve Disease, Signs/Symptoms:Shortness of                 Breath, Chest Pain and  Dizziness/Lightheadedness; Risk                 Factors:Hypertension, Sleep Apnea and Diabetes.  Sonographer:    Milda Smart Referring Phys: [id number] PETER E BABCOCK  Sonographer Comments: Suboptimal subcostal window. Image acquisition challenging due to patient body habitus and Image acquisition challenging due to respiratory motion. IMPRESSIONS  1. Left ventricular ejection fraction, by estimation, is 60 to 65%. The left ventricle has normal function. The left ventricle has no regional wall motion abnormalities. There is mild left ventricular hypertrophy. Left ventricular diastolic parameters are indeterminate. Elevated left atrial pressure. The E/e' is 24.  2. Right ventricular systolic function is normal. The right ventricular size is normal.  3. The mitral valve is normal in structure. Mild mitral valve regurgitation. No evidence of mitral stenosis.  4. The aortic valve is tricuspid. There is moderate calcification of the aortic valve. There is mild thickening of the aortic valve. Aortic valve regurgitation is not visualized. Moderate aortic valve stenosis. Comparison(s): A prior study was performed on 12/16/2022. Moderate AS remains stable, Grade 3 diastolic dysfunction is now  indeterminate with elevated LAP. Conclusion(s)/Recommendation(s): No intracardiac source of embolism detected on this transthoracic study. Consider a transesophageal echocardiogram to exclude cardiac source of embolism if clinically indicated. FINDINGS  Left Ventricle: Left ventricular ejection fraction, by estimation, is 60 to 65%. The left ventricle has normal function. The left ventricle has no regional wall motion abnormalities. The left ventricular internal cavity size was normal in size. There is  mild left ventricular hypertrophy. Left ventricular diastolic parameters are indeterminate. Elevated left atrial pressure. The E/e' is 24. Right Ventricle: The right ventricular size is normal. No increase in right ventricular wall thickness. Right ventricular systolic function is normal. Left Atrium: Left atrial size was normal in size. Right Atrium: Right atrial size was normal in size. Pericardium: There is no evidence of pericardial effusion. Mitral Valve: The mitral valve is normal in structure. Mild mitral valve regurgitation. No evidence of mitral valve stenosis. MV peak gradient, 6.2 mmHg. The mean mitral valve gradient is 2.0 mmHg. Tricuspid Valve: The tricuspid valve is normal in structure. Tricuspid valve regurgitation is mild . No evidence of tricuspid stenosis. Aortic Valve: The aortic valve is tricuspid. There is moderate calcification of the aortic valve. There is mild thickening of the aortic valve. There is mild aortic valve annular calcification. Aortic valve regurgitation is not visualized. Moderate aortic stenosis is present. Aortic valve mean gradient measures 31.0 mmHg. Aortic valve peak gradient measures 50.0 mmHg. Aortic valve area, by VTI measures 1.90 cm. Pulmonic Valve: The pulmonic valve was normal in structure. Pulmonic valve regurgitation is trivial. No evidence of pulmonic stenosis. Aorta: The aortic root and ascending aorta are structurally normal, with no evidence of dilitation.  IAS/Shunts: The interatrial septum was not well visualized.  LEFT VENTRICLE PLAX 2D LVIDd:         4.60 cm   Diastology LVIDs:         2.90 cm   LV e' medial:    4.51 cm/s LV PW:         1.10 cm   LV E/e' medial:  27.3 LV IVS:        1.20 cm   LV e' lateral:   6.20 cm/s LVOT diam:     2.00 cm   LV E/e' lateral: 19.8 LV SV:         138 LV SV Index:   66 LVOT Area:     3.14  cm  RIGHT VENTRICLE RV S prime:     11.70 cm/s TAPSE (M-mode): 1.8 cm LEFT ATRIUM             Index        RIGHT ATRIUM           Index LA diam:        4.10 cm 1.97 cm/m   RA Area:     16.10 cm LA Vol (A2C):   40.7 ml 19.57 ml/m  RA Volume:   36.50 ml  17.55 ml/m LA Vol (A4C):   58.5 ml 28.12 ml/m LA Biplane Vol: 53.2 ml 25.58 ml/m  AORTIC VALVE AV Area (Vmax):    1.71 cm AV Area (Vmean):   1.64 cm AV Area (VTI):     1.90 cm AV Vmax:           353.50 cm/s AV Vmean:          261.000 cm/s AV VTI:            0.723 m AV Peak Grad:      50.0 mmHg AV Mean Grad:      31.0 mmHg LVOT Vmax:         192.00 cm/s LVOT Vmean:        136.000 cm/s LVOT VTI:          0.438 m LVOT/AV VTI ratio: 0.61  AORTA Ao Root diam: 3.00 cm Ao Asc diam:  3.10 cm MITRAL VALVE                TRICUSPID VALVE MV Area (PHT): 1.88 cm     TR Peak grad:   23.0 mmHg MV Area VTI:   2.91 cm     TR Vmax:        240.00 cm/s MV Peak grad:  6.2 mmHg MV Mean grad:  2.0 mmHg     SHUNTS MV Vmax:       1.25 m/s     Systemic VTI:  0.44 m MV Vmean:      70.1 cm/s    Systemic Diam: 2.00 cm MV Decel Time: 404 msec MR Peak grad: 57.8 mmHg MR Vmax:      380.00 cm/s MV E velocity: 123.00 cm/s MV A velocity: 72.70 cm/s MV E/A ratio:  1.69 Sunit Tolia Electronically signed by Tessa Lerner Signature Date/Time: 09/02/2023/6:11:47 PM    Final    VAS US CAROTID  Result Date: 09/02/2023 Carotid Arterial Duplex Study Patient Name:  Sherry Fitzpatrick Hoefle  Date of Exam:   09/01/2023 Medical Rec #: [id number]        Accession #:    [id number] Date of Birth: Jul 09, 1951         Patient Gender: F Patient Age:    31 years Exam Location:  [facility] Procedure:      VAS US CAROTID Referring Phys: Gevena Mart --------------------------------------------------------------------------------  Indications:  CVA. Risk Factors: Hypertension, prior MI, prior CVA. Performing Technologist: Shona Simpson  Examination Guidelines: A complete evaluation includes B-mode imaging, spectral Doppler, color Doppler, and power Doppler as needed of all accessible portions of each vessel. Bilateral testing is considered an integral part of a complete examination. Limited examinations for reoccurring indications may be performed as noted.  Right Carotid Findings: +----------+--------+--------+--------+------------------+------------------+           PSV cm/sEDV cm/sStenosisPlaque DescriptionComments           +----------+--------+--------+--------+------------------+------------------+ CCA Prox  75      14                                                   +----------+--------+--------+--------+------------------+------------------+  CCA Distal83      19                                intimal thickening +----------+--------+--------+--------+------------------+------------------+ ICA Prox  93      37      1-39%   heterogenous                         +----------+--------+--------+--------+------------------+------------------+ ICA Distal109     41                                                   +----------+--------+--------+--------+------------------+------------------+ ECA       151     28                                                   +----------+--------+--------+--------+------------------+------------------+ +----------+--------+-------+--------+-------------------+           PSV cm/sEDV cmsDescribeArm Pressure (mmHG) +----------+--------+-------+--------+-------------------+ Subclavian151     0                                   +----------+--------+-------+--------+-------------------+ +---------+--------+--+--------+--+ VertebralPSV cm/s76EDV cm/s20 +---------+--------+--+--------+--+  Left Carotid Findings: +----------+--------+--------+--------+------------------+--------+           PSV cm/sEDV cm/sStenosisPlaque DescriptionComments +----------+--------+--------+--------+------------------+--------+ CCA Prox  60      12                                         +----------+--------+--------+--------+------------------+--------+ CCA Distal55      15                                         +----------+--------+--------+--------+------------------+--------+ ICA Prox  112     46      40-59%  heterogenous               +----------+--------+--------+--------+------------------+--------+ ICA Mid   93      28                                         +----------+--------+--------+--------+------------------+--------+ ICA Distal87      29                                         +----------+--------+--------+--------+------------------+--------+ ECA       118     9                                          +----------+--------+--------+--------+------------------+--------+ +----------+--------+--------+--------+-------------------+           PSV cm/sEDV cm/sDescribeArm Pressure (mmHG) +----------+--------+--------+--------+-------------------+ [id number]  0                                   +----------+--------+--------+--------+-------------------+ +---------+--------+--+--------+--+ VertebralPSV cm/s55EDV cm/s17 +---------+--------+--+--------+--+   Summary: Right Carotid: Velocities in the right ICA are consistent with a 1-39% stenosis. Left Carotid: Velocities in the left ICA are consistent with a 40-59% stenosis. Vertebrals:  Bilateral vertebral arteries demonstrate antegrade flow. Subclavians: Normal flow hemodynamics were seen in bilateral subclavian               arteries. *See table(s) above for measurements and observations.  Electronically signed by Coral Else MD on 09/02/2023 at 3:37:05 PM.    Final    MR ANGIO HEAD WO CONTRAST  Result Date: 09/01/2023 CLINICAL DATA:  Stroke follow-up. EXAM: MRA HEAD WITHOUT CONTRAST TECHNIQUE: Angiographic images of the Circle of Willis were acquired using MRA technique without intravenous contrast. COMPARISON:  MRA head 05/28/2020 FINDINGS: Anterior circulation: The internal carotid arteries are patent from skull base to carotid termini with similar appearance of multifocal stenoses, including severe right and moderate to severe left proximal supraclinoid stenoses. ACAs and MCAs are patent without evidence of a proximal branch occlusion or flow limiting M1 stenosis. Severe bilateral A1 stenoses are again seen. There are also severe bilateral M2 stenoses which have likely mildly progressed. No aneurysm is identified. Posterior circulation: The intracranial vertebral arteries are patent to the basilar. A severe right V4 stenosis immediately distal to the origin of the right PICA is unchanged from the prior study. A mild proximal left V4 stenosis is less prominent than on the prior study. Patent PICA and SCA origins are visualized bilaterally. The basilar artery is patent and mildly small in caliber diffusely with mild irregularity but no significant focal stenosis. Posterior communicating arteries are diminutive or absent. Severe bilateral distal P1 and left P2 stenoses appear mildly progressive. No aneurysm is identified. Anatomic variants: None. IMPRESSION: Severe intracranial atherosclerosis as detailed above with mild progression from 2021. No large vessel occlusion. Electronically Signed   By: Sebastian Ache M.D.   On: 09/01/2023 17:13    Cardiac Studies   Echo 09/02/23:   1. Left ventricular ejection fraction, by estimation, is 60 to 65%. The  left ventricle has normal function. The left ventricle has no regional   wall motion abnormalities. There is mild left ventricular hypertrophy.  Left ventricular diastolic parameters  are indeterminate. Elevated left atrial pressure. The E/e' is 24.   2. Right ventricular systolic function is normal. The right ventricular  size is normal.   3. The mitral valve is normal in structure. Mild mitral valve  regurgitation. No evidence of mitral stenosis.   4. The aortic valve is tricuspid. There is moderate calcification of the  aortic valve. There is mild thickening of the aortic valve. Aortic valve  regurgitation is not visualized. Moderate aortic valve stenosis.   Patient Profile     72 y.o. female with history of COPD, DM, CKD, sleep apnea, aortic stenosis, PAF and CAD admitted with acute metabolic encephalopathy secondary to acute hypercarbic respiratory failure, pulmonary edema and CVA with acute on chronic renal failure. She was seen on 09/01/23 by Dr. Lalla Brothers for evaluation of chest pain and dyspnea.   Assessment & Plan    CAD with chest pain:  She has had prior coronary intervention with placement of a drug eluting stent in the LAD in 2021. She c/o chest pain on admission. Mild elevation  of troponin in setting of acute respiratory failure. Echo with normal LV function. No chest pain today. There were plans in place for an outpatient cardiac cath. Given her presentation, we have discussed trying to perform her cardiac cath while she is admitted but this may be delayed based on her renal function. If no significant improvement in renal function, we will most likely delay her cardiac cath and consider bringing her back in several weeks as an outpatient. BMET is pending.  -Of note, she states that has had a reaction in the past to contrast dye. If we proceed with cardiac cath today, will need pretreatment.   Aortic stenosis: Moderate by echo yesterday.   Paroxysmal atrial fibrillation: sinus today. Unclear why she has not been on anti-coagulation. She is now on ASA  and Brilinta per Neurology post CVA. With recent CVA, would recommend that she have a live cardiac monitor at time of discharge for 2 weeks +/- ILR following that (per recs of EP team-Dr. Lalla Brothers on 09/01/23).   For questions or updates, please contact [facility] Please consult [email or web address] for contact info under   Signed, Verne Carrow, MD  09/03/2023, 8:10 AM

## 2023-09-03 NOTE — Progress Notes (Signed)
[facility]                                                                               Sherry Fitzpatrick, is a 72 y.o. female, DOB - 1951-05-14, WUJ:[id number] Admit date - 08/31/2023    Outpatient Primary MD for the patient is Shon Hale, MD  LOS - 3  days    Brief summary    72 year old gentleman with prior history of COPD, diabetes, coronary artery disease obstructive sleep apnea, GERD presents with chest pain and shortness of breath along with some dizziness.  She was started on Antivert and Zofran to address vertigo and was being set up for further cardiac evaluation.  But on taking Antivert and Zofran patient became more sleepy confused and presented to ED with acute metabolic encephalopathy on 11/16 was found to be in decompensated heart failure hypercarbia.  MRI of the brain showed small punctate focus in right lentiform nucleus consistent with tiny infarct. She was transferred to [facility] on 11/18.   Neurology and cardiology on board   Assessment & Plan    Assessment and Plan:   Acute metabolic encephalopathy probably secondary to Zofran, meclizine, hypercarbia and CVA Resolved.  patient is alert and oriented and answering all questions appropriately. Avoid sedating medications   Acute CVA Single punctate focus in right lentiform nucleus consistent with tiny infarct. Neurology on board and further workup for CVA pending EEG showed continuous generalized slowing no seizure activity seen. Carotid duplex unremarkable, Echo- does not show any intracardiac thrombus.  Therapy evaluations are pending LDL is 109, Hemoglobin A1c is 7.  Neurology on board and recommends aspirin 81 mg daily and Brillinta 90 mg BID.    Chest pain Appears to have resolved History of coronary artery disease s/p DES S in LAD in 2021, in view of her presentation and chest pain, she is scheduled for cardiac cath scheduled for tomorrow, waiting for renal parameters to improve.   She has contrast allergy and will need premedications, pharmacy aware.  Echocardiogram reviewed. Discussed with the daughter over the phone.  Continue with aspirin and Lipitor 40 mg daily for now   Paroxysmal atrial fibrillation Rate controlled with amiodarone 200 mg daily and Coreg 6.25 mg twice daily Cardiology recommends cardiac monitor at the time of discharge Not on anti coagulation. She is on aspirin and Brilinta per neurology.  Outpatient follow up.    Anemia of chronic disease:  Hemoglobin around 10. Monitor.    Hypertension Blood pressure parameters are optimal  patient currently on hydralazine 25 mg 3 times daily, Imdur 120 mg daily, amlodipine 5 mg daily and Coreg 6.25 mg twice daily.   AKI Unclear what her baseline creatinine is,  creatinine ranging from 1.1 to 2. Admitted with a creatinine of 2 this admission, improved to 1.9 to 1.7 today.  continue to monitor.  Avoid nephrotoxins.    History of COPD No wheezing heard  currently on Pulmicort Brovana nebulizer  OSA Recommend outpatient follow up with pulmonology for sleep study.   Body mass index is 39.86 kg/m. Obesity Recommend outpatient with PCP .   Estimated body mass index is 39.86 kg/m as calculated from the following:  Height as of 08/28/23: 5\' 4"  (1.626 m).   Weight as of this encounter: 105.3 kg.  Code Status: full code.  DVT Prophylaxis:  SCDs Start: 08/31/23 1341   Level of Care: Level of care: Progressive Family Communication: none at bedside.   Disposition Plan:     Remains inpatient appropriate:  IV lasix.  Procedures:  Echocardiogram.   Consultants:   Cardiology Neurology   Antimicrobials:   Anti-infectives (From admission, onward)    None        Medications  Scheduled Meds:  amiodarone  200 mg Oral Daily   amLODipine  5 mg Oral Daily   arformoterol  15 mcg Nebulization BID   atorvastatin  40 mg Oral Daily   budesonide (PULMICORT) nebulizer solution  0.5 mg  Nebulization BID   carvedilol  6.25 mg Oral BID WC   Chlorhexidine Gluconate Cloth  6 each Topical Daily   diphenhydrAMINE  50 mg Oral Once   Or   diphenhydrAMINE  50 mg Intravenous Once   hydrALAZINE  25 mg Oral Q8H   isosorbide mononitrate  120 mg Oral Daily   mupirocin ointment  1 Application Nasal BID   pantoprazole  40 mg Oral Q1200   predniSONE  50 mg Oral Q6H   sertraline  25 mg Oral Daily   ticagrelor  90 mg Oral BID   Continuous Infusions: PRN Meds:.acetaminophen, melatonin, oxyCODONE, polyethylene glycol, prochlorperazine    Subjective:   Sherry Fitzpatrick was seen and examined today. Reports being back to baseline, no chest pain or sob.   Objective:   Vitals:   09/03/23 0738 09/03/23 0740 09/03/23 0913 09/03/23 0918  BP:   (!) 143/54 (!) 143/54  Pulse: 66  62 62  Resp: 20  19   Temp:   98.2 F (36.8 C)   TempSrc:   Oral   SpO2: 97% 97% 90%   Weight:        Intake/Output Summary (Last 24 hours) at 09/03/2023 1040 Last data filed at 09/03/2023 0410 Gross per 24 hour  Intake 710 ml  Output 600 ml  Net 110 ml   Filed Weights   08/31/23 1539 09/02/23 0336 09/03/23 0409  Weight: 106.5 kg 104.8 kg 105.3 kg     Exam General exam: Appears calm and comfortable  Respiratory system: Clear to auscultation. Respiratory effort normal. Cardiovascular system: S1 & S2 heard, RRR. No JVD,  Gastrointestinal system: Abdomen is nondistended, soft and nontender. Central nervous system: Alert and oriented.  Extremities: no pedal edema.  Skin: No rashes,  Psychiatry: Mood & affect appropriate.      Data Reviewed:  I have personally reviewed following labs and imaging studies   CBC Lab Results  Component Value Date   WBC 8.5 09/02/2023   RBC 3.62 (L) 09/02/2023   HGB 10.0 (L) 09/02/2023   HCT 32.4 (L) 09/02/2023   MCV 89.5 09/02/2023   MCH 27.6 09/02/2023   PLT 238 09/02/2023   MCHC 30.9 09/02/2023   RDW 16.3 (H) 09/02/2023   LYMPHSABS 1.7 08/31/2023    MONOABS 0.6 08/31/2023   EOSABS 0.0 08/31/2023   BASOSABS 0.1 08/31/2023     Last metabolic panel Lab Results  Component Value Date   NA 140 09/02/2023   K 4.2 09/02/2023   CL 102 09/02/2023   CO2 26 09/02/2023   BUN 34 (H) 09/02/2023   CREATININE 1.91 (H) 09/02/2023   GLUCOSE 107 (H) 09/02/2023   GFRNONAA 27 (L) 09/02/2023   GFRAA 60 (L) 06/09/2020  CALCIUM 8.5 (L) 09/02/2023   PHOS 3.4 07/08/2023   PROT 7.1 08/31/2023   ALBUMIN 3.3 (L) 08/31/2023   LABGLOB 3.3 08/28/2023   BILITOT 0.8 08/31/2023   ALKPHOS 53 08/31/2023   AST 19 08/31/2023   ALT 11 08/31/2023   ANIONGAP 12 09/02/2023    CBG (last 3)  Recent Labs    09/01/23 0739 09/01/23 1106 09/02/23 0814  GLUCAP 92 90 96      Coagulation Profile: No results for input(s): "INR", "PROTIME" in the last 168 hours.   Radiology Studies: ECHOCARDIOGRAM COMPLETE  Result Date: 09/02/2023    ECHOCARDIOGRAM REPORT   Patient Name:   Sherry Fitzpatrick Date of Exam: 09/02/2023 Medical Rec #:  [id number]       Height:       64.0 in Accession #:    [id number]      Weight:       231.0 lb Date of Birth:  03/19/1951        BSA:          2.080 m Patient Age:    72 years        BP:           117/67 mmHg Patient Gender: F               HR:           57 bpm. Exam Location:  Inpatient Procedure: 2D Echo, Color Doppler and Cardiac Doppler Indications:    Stroke  History:        Patient has prior history of Echocardiogram examinations, most                 recent 12/16/2022. Previous Myocardial Infarction and CAD, Stroke                 and COPD, Aortic Valve Disease, Signs/Symptoms:Shortness of                 Breath, Chest Pain and Dizziness/Lightheadedness; Risk                 Factors:Hypertension, Sleep Apnea and Diabetes.  Sonographer:    Milda Smart Referring Phys: [id number] PETER E BABCOCK  Sonographer Comments: Suboptimal subcostal window. Image acquisition challenging due to patient body habitus and Image acquisition challenging due to  respiratory motion. IMPRESSIONS  1. Left ventricular ejection fraction, by estimation, is 60 to 65%. The left ventricle has normal function. The left ventricle has no regional wall motion abnormalities. There is mild left ventricular hypertrophy. Left ventricular diastolic parameters are indeterminate. Elevated left atrial pressure. The E/e' is 24.  2. Right ventricular systolic function is normal. The right ventricular size is normal.  3. The mitral valve is normal in structure. Mild mitral valve regurgitation. No evidence of mitral stenosis.  4. The aortic valve is tricuspid. There is moderate calcification of the aortic valve. There is mild thickening of the aortic valve. Aortic valve regurgitation is not visualized. Moderate aortic valve stenosis. Comparison(s): A prior study was performed on 12/16/2022. Moderate AS remains stable, Grade 3 diastolic dysfunction is now indeterminate with elevated LAP. Conclusion(s)/Recommendation(s): No intracardiac source of embolism detected on this transthoracic study. Consider a transesophageal echocardiogram to exclude cardiac source of embolism if clinically indicated. FINDINGS  Left Ventricle: Left ventricular ejection fraction, by estimation, is 60 to 65%. The left ventricle has normal function. The left ventricle has no regional wall motion abnormalities. The left ventricular internal cavity size was normal in size. There is  mild left ventricular hypertrophy. Left ventricular diastolic parameters are indeterminate. Elevated left atrial pressure. The E/e' is 24. Right Ventricle: The right ventricular size is normal. No increase in right ventricular wall thickness. Right ventricular systolic function is normal. Left Atrium: Left atrial size was normal in size. Right Atrium: Right atrial size was normal in size. Pericardium: There is no evidence of pericardial effusion. Mitral Valve: The mitral valve is normal in structure. Mild mitral valve regurgitation. No evidence of  mitral valve stenosis. MV peak gradient, 6.2 mmHg. The mean mitral valve gradient is 2.0 mmHg. Tricuspid Valve: The tricuspid valve is normal in structure. Tricuspid valve regurgitation is mild . No evidence of tricuspid stenosis. Aortic Valve: The aortic valve is tricuspid. There is moderate calcification of the aortic valve. There is mild thickening of the aortic valve. There is mild aortic valve annular calcification. Aortic valve regurgitation is not visualized. Moderate aortic stenosis is present. Aortic valve mean gradient measures 31.0 mmHg. Aortic valve peak gradient measures 50.0 mmHg. Aortic valve area, by VTI measures 1.90 cm. Pulmonic Valve: The pulmonic valve was normal in structure. Pulmonic valve regurgitation is trivial. No evidence of pulmonic stenosis. Aorta: The aortic root and ascending aorta are structurally normal, with no evidence of dilitation. IAS/Shunts: The interatrial septum was not well visualized.  LEFT VENTRICLE PLAX 2D LVIDd:         4.60 cm   Diastology LVIDs:         2.90 cm   LV e' medial:    4.51 cm/s LV PW:         1.10 cm   LV E/e' medial:  27.3 LV IVS:        1.20 cm   LV e' lateral:   6.20 cm/s LVOT diam:     2.00 cm   LV E/e' lateral: 19.8 LV SV:         138 LV SV Index:   66 LVOT Area:     3.14 cm  RIGHT VENTRICLE RV S prime:     11.70 cm/s TAPSE (M-mode): 1.8 cm LEFT ATRIUM             Index        RIGHT ATRIUM           Index LA diam:        4.10 cm 1.97 cm/m   RA Area:     16.10 cm LA Vol (A2C):   40.7 ml 19.57 ml/m  RA Volume:   36.50 ml  17.55 ml/m LA Vol (A4C):   58.5 ml 28.12 ml/m LA Biplane Vol: 53.2 ml 25.58 ml/m  AORTIC VALVE AV Area (Vmax):    1.71 cm AV Area (Vmean):   1.64 cm AV Area (VTI):     1.90 cm AV Vmax:           353.50 cm/s AV Vmean:          261.000 cm/s AV VTI:            0.723 m AV Peak Grad:      50.0 mmHg AV Mean Grad:      31.0 mmHg LVOT Vmax:         192.00 cm/s LVOT Vmean:        136.000 cm/s LVOT VTI:          0.438 m LVOT/AV VTI  ratio: 0.61  AORTA Ao Root diam: 3.00 cm Ao Asc diam:  3.10 cm MITRAL VALVE  TRICUSPID VALVE MV Area (PHT): 1.88 cm     TR Peak grad:   23.0 mmHg MV Area VTI:   2.91 cm     TR Vmax:        240.00 cm/s MV Peak grad:  6.2 mmHg MV Mean grad:  2.0 mmHg     SHUNTS MV Vmax:       1.25 m/s     Systemic VTI:  0.44 m MV Vmean:      70.1 cm/s    Systemic Diam: 2.00 cm MV Decel Time: 404 msec MR Peak grad: 57.8 mmHg MR Vmax:      380.00 cm/s MV E velocity: 123.00 cm/s MV A velocity: 72.70 cm/s MV E/A ratio:  1.69 Sunit Tolia Electronically signed by Tessa Lerner Signature Date/Time: 09/02/2023/6:11:47 PM    Final    VAS US CAROTID  Result Date: 09/02/2023 Carotid Arterial Duplex Study Patient Name:  Sherry Fitzpatrick  Date of Exam:   09/01/2023 Medical Rec #: [id number]        Accession #:    [id number] Date of Birth: 1951-04-30         Patient Gender: F Patient Age:   58 years Exam Location:  [facility] Procedure:      VAS US CAROTID Referring Phys: Gevena Mart --------------------------------------------------------------------------------  Indications:  CVA. Risk Factors: Hypertension, prior MI, prior CVA. Performing Technologist: Shona Simpson  Examination Guidelines: A complete evaluation includes B-mode imaging, spectral Doppler, color Doppler, and power Doppler as needed of all accessible portions of each vessel. Bilateral testing is considered an integral part of a complete examination. Limited examinations for reoccurring indications may be performed as noted.  Right Carotid Findings: +----------+--------+--------+--------+------------------+------------------+           PSV cm/sEDV cm/sStenosisPlaque DescriptionComments           +----------+--------+--------+--------+------------------+------------------+ CCA Prox  75      14                                                   +----------+--------+--------+--------+------------------+------------------+ CCA Distal83      19                                 intimal thickening +----------+--------+--------+--------+------------------+------------------+ ICA Prox  93      37      1-39%   heterogenous                         +----------+--------+--------+--------+------------------+------------------+ ICA Distal109     41                                                   +----------+--------+--------+--------+------------------+------------------+ ECA       151     28                                                   +----------+--------+--------+--------+------------------+------------------+ +----------+--------+-------+--------+-------------------+           PSV cm/sEDV  cmsDescribeArm Pressure (mmHG) +----------+--------+-------+--------+-------------------+ Subclavian151     0                                  +----------+--------+-------+--------+-------------------+ +---------+--------+--+--------+--+ VertebralPSV cm/s76EDV cm/s20 +---------+--------+--+--------+--+  Left Carotid Findings: +----------+--------+--------+--------+------------------+--------+           PSV cm/sEDV cm/sStenosisPlaque DescriptionComments +----------+--------+--------+--------+------------------+--------+ CCA Prox  60      12                                         +----------+--------+--------+--------+------------------+--------+ CCA Distal55      15                                         +----------+--------+--------+--------+------------------+--------+ ICA Prox  112     46      40-59%  heterogenous               +----------+--------+--------+--------+------------------+--------+ ICA Mid   93      28                                         +----------+--------+--------+--------+------------------+--------+ ICA Distal87      29                                         +----------+--------+--------+--------+------------------+--------+ ECA       118     9                                           +----------+--------+--------+--------+------------------+--------+ +----------+--------+--------+--------+-------------------+           PSV cm/sEDV cm/sDescribeArm Pressure (mmHG) +----------+--------+--------+--------+-------------------+ [id number]      0                                   +----------+--------+--------+--------+-------------------+ +---------+--------+--+--------+--+ VertebralPSV cm/s55EDV cm/s17 +---------+--------+--+--------+--+   Summary: Right Carotid: Velocities in the right ICA are consistent with a 1-39% stenosis. Left Carotid: Velocities in the left ICA are consistent with a 40-59% stenosis. Vertebrals:  Bilateral vertebral arteries demonstrate antegrade flow. Subclavians: Normal flow hemodynamics were seen in bilateral subclavian              arteries. *See table(s) above for measurements and observations.  Electronically signed by Coral Else MD on 09/02/2023 at 3:37:05 PM.    Final    MR ANGIO HEAD WO CONTRAST  Result Date: 09/01/2023 CLINICAL DATA:  Stroke follow-up. EXAM: MRA HEAD WITHOUT CONTRAST TECHNIQUE: Angiographic images of the Circle of Willis were acquired using MRA technique without intravenous contrast. COMPARISON:  MRA head 05/28/2020 FINDINGS: Anterior circulation: The internal carotid arteries are patent from skull base to carotid termini with similar appearance of multifocal stenoses, including severe right and moderate to severe left proximal supraclinoid stenoses. ACAs and MCAs are patent without evidence of a proximal branch occlusion or flow limiting M1 stenosis. Severe bilateral A1  stenoses are again seen. There are also severe bilateral M2 stenoses which have likely mildly progressed. No aneurysm is identified. Posterior circulation: The intracranial vertebral arteries are patent to the basilar. A severe right V4 stenosis immediately distal to the origin of the right PICA is unchanged from the prior study. A  mild proximal left V4 stenosis is less prominent than on the prior study. Patent PICA and SCA origins are visualized bilaterally. The basilar artery is patent and mildly small in caliber diffusely with mild irregularity but no significant focal stenosis. Posterior communicating arteries are diminutive or absent. Severe bilateral distal P1 and left P2 stenoses appear mildly progressive. No aneurysm is identified. Anatomic variants: None. IMPRESSION: Severe intracranial atherosclerosis as detailed above with mild progression from 2021. No large vessel occlusion. Electronically Signed   By: Sebastian Ache M.D.   On: 09/01/2023 17:13       Kathlen Mody M.D. [facility] 09/03/2023, 10:40 AM  Available via Epic secure chat 7am-7pm After 7 pm, please refer to night coverage provider listed on amion.

## 2023-09-03 NOTE — Progress Notes (Signed)
Mobility Specialist Progress Note:   09/03/23 1000  Oxygen Therapy  O2 Device Room Air  Mobility  Activity Ambulated with assistance in hallway  Level of Assistance Contact guard assist, steadying assist  Assistive Device Front wheel walker  Distance Ambulated (ft) 70 ft  Activity Response Tolerated well  Mobility Referral Yes  $Mobility charge 1 Mobility  Mobility Specialist Start Time (ACUTE ONLY) 1018  Mobility Specialist Stop Time (ACUTE ONLY) 1036  Mobility Specialist Time Calculation (min) (ACUTE ONLY) 18 min    Pre Mobility: 59 HR,  96% SpO2 During Mobility: 77 HR,  93% SpO2 Post Mobility:  66 HR,  96% SpO2  Pt received in chair, agreeable to mobility. Asymptomatic w/ no complaints throughout. SpO2 in 90s throughout on room air. Returned to room w/o fault. Pt left in bed with call bell and all needs met.  [place] Earlene Plater Mobility Specialist Please contact via Special educational needs teacher or Rehab office at [phone]

## 2023-09-03 NOTE — Progress Notes (Signed)
STROKE TEAM PROGRESS NOTE   BRIEF HPI [name] is a 72 y.o. female with history of CAD s/p stenting, atrial fibrillation (not on anticoagulation), CKD3, COPD, prior acute hypoxic respiratory failure, CHF exacerbation, AKI, strokes in 2004, 2011 and 2021, morbid obesity, gout, hypertensive encephalopathy, mild cognitive impairment, HLD and sleep apnea who presented to the ED this morning after she was found unresponsive by family at 10:30 AM on 08/31/2023 and admitted for altered mental status. CCM noticed possible right arm weakness on exam and neurology was consulted for further work-up.    SIGNIFICANT HOSPITAL EVENTS MRI:  Single punctate focus of restricted diffusion involving the right lentiform nucleus, suspicious for a tiny acute ischemic infarct. No associated hemorrhage. Underlying moderately advanced chronic microvascular ischemic disease with a few scattered remote infarcts   INTERIM HISTORY/SUBJECTIVE Patient sitting up in bedside chair.  She is doing well.  She is back to her baseline.  She has no complaints.  Echocardiogram showed normal ejection fraction.  No cardiac source of embolism.  Vital signs stable.   OBJECTIVE  CBC    Component Value Date/Time   WBC 8.0 09/03/2023 1107   RBC 3.72 (L) 09/03/2023 1107   HGB 10.6 (L) 09/03/2023 1107   HGB 12.1 08/28/2023 1006   HCT 32.7 (L) 09/03/2023 1107   HCT 37.3 08/28/2023 1006   PLT 272 09/03/2023 1107   PLT 294 08/28/2023 1006   MCV 87.9 09/03/2023 1107   MCV 88 08/28/2023 1006   MCH 28.5 09/03/2023 1107   MCHC 32.4 09/03/2023 1107   RDW 16.2 (H) 09/03/2023 1107   RDW 15.5 (H) 08/28/2023 1006   LYMPHSABS 1.7 08/31/2023 1146   LYMPHSABS 2.0 08/28/2023 1006   MONOABS 0.6 08/31/2023 1146   EOSABS 0.0 08/31/2023 1146   EOSABS 0.3 08/28/2023 1006   BASOSABS 0.1 08/31/2023 1146   BASOSABS 0.1 08/28/2023 1006    BMET    Component Value Date/Time   NA 139 09/03/2023 1107   NA 139 08/28/2023 1006   K 3.7  09/03/2023 1107   CL 101 09/03/2023 1107   CO2 29 09/03/2023 1107   GLUCOSE 101 (H) 09/03/2023 1107   BUN 27 (H) 09/03/2023 1107   BUN 18 08/28/2023 1006   CREATININE 1.72 (H) 09/03/2023 1107   CALCIUM 8.9 09/03/2023 1107   EGFR 44 (L) 08/28/2023 1006   GFRNONAA 31 (L) 09/03/2023 1107    IMAGING past 24 hours No results found.  Vitals:   09/03/23 1459 09/03/23 1620 09/03/23 1655 09/03/23 1722  BP: (!) 143/72 (!) 148/65 (!) 148/65   Pulse:  (!) 57 66   Resp:  15    Temp:  98.2 F (36.8 C)    TempSrc:  Oral    SpO2:  95%    Weight:      Height:    5\' 4"  (1.626 m)     PHYSICAL EXAM General:  Alert, obese elderly African-American lady in no acute distress Psych:  Mood and affect appropriate for situation CV: Regular rate and rhythm on monitor Respiratory:  Regular, unlabored respirations on room air GI: Abdomen soft and nontender   NEURO:  Mental Status: AA&O to person,  patient is unable to give clear and coherent history, requests help from daughter bedside.  Diminished attention with short-term recall.  Poor insight into her illness. Speech/Language: speech is without dysarthria or aphasia.  Cranial Nerves:  II: PERRL. Visual fields full.  III, IV, VI: EOMI. Eyelids elevate symmetrically.  V: Sensation is  intact to light touch and symmetrical to face.  VII: Face is symmetrical resting and smiling VIII: hearing intact to voice. IX, X: Palate elevates symmetrically. Phonation is normal.  QI:ONGEXBMW shrug 5/5. XII: tongue is midline without fasciculations. Motor: Grossly moves all extremities, no symmetrically.  Tone: is normal and bulk is normal Sensation- Intact to light touch bilaterally. Extinction absent to light touch to DSS.   Coordination: FTN intact bilaterally,mildly slowing in RUE and tremor noted HKS: Right side drift noted  Gait- deferred  NIHSS: 2  ASSESSMENT/PLAN  Tiny acute right lentiform nucleus ischemic infarct likely clinically silent.   Transient right-sided weakness likely left hemispheric TIA from small vessel disease Etiology:  Small vessel disease. CT head No acute abnormality. Chronic microvascular disease.   MRI  single punctate focus of restricted diffusion involving the right lentiform nucleus, suspicious for a tiny acute ischemic infarct. No associated hemorrhage. Underlying moderately advanced chronic microvascular ischemic disease with a few scattered remote infarcts    MR Angio No LVO, severe intracranial atherosclerosis w/ mild progression  2D Echo 60-65%.   Carotid Duplex L ICA w/ 40-59% stenosis and R ICA w/ 1-39% stenosis  LDL 109 HgbA1c 5.5 EEG negative for seizure, generalized slowing  VTE prophylaxis - SCDs aspirin 81 mg daily and clopidogrel 75 mg daily prior to admission, now changed to Eliquis due to history of paroxysmal A-fib .cardiology can add antiplatelet  if needed from cardiac standpoint in addition Therapy recommendations:  Pending Disposition:  Pending   Hx of Stroke/TIA Prior history of stroke, cerebral thrombosis w/ infarction 05/29/2020 Home regimen was ASA 81 mg daily and Plavix 75 mg daily    Paroxsymal atrial fibrillation:  Home medications: Amiodarone 200 mg daily and carvedilol 12.5 mg BID   Continue home medication  Cardiology recommended live cardiac monitor at time of discharge   Hypertension Home meds:  Amlodipine 5 mg daily, carvedilol 12.5 mg BID, Hydralazine 25 mg q8h, imdur 120 mg daily  Stable Normotensive BP goal  Continue meds   Hyperlipidemia Home meds:  Lipitor 40 mg resumed in hospital LDL 109, goal < 70 Continue statin at discharge  Diabetes type II Controlled Home meds:  None HgbA1c 5.5, goal < 7.0 Recommend close follow-up with PCP for better DM controlled   Other Stroke Risk Factors Obesity, Body mass index is 39.86 kg/m., BMI >/= 30 associated with increased stroke risk, recommend weight loss, diet and exercise as appropriate Coronary artery  disease s/p DES: cardiology considering cardiac cath if renal function improves  Chronic Diastolic congestive heart failure:  Home meds: Amlodipine 5 mg daily, carvedilol 12.5 mg BID, Hydralazine 25 mg q8h, imdur 120 mg daily  Continue home meds  BNP: 377 on admission >> 275 today   OSA, no CPAP, recommend outpatient sleep study    Other Active Problems CKD 3B Cr Stable at 1.91 AM   Hospital day # 3  I have personally obtained history,examined this patient, reviewed notes, independently viewed imaging studies, participated in medical decision making and plan of care.ROS completed by me personally and pertinent positives fully documented  I have made any additions or clarifications directly to the above note. Agree with note above.  Patient presented with altered mental status was hypercarbic with some right-sided weakness and appears to be improving.  MRI shows small tiny right punctate basal ganglia lacunar infarct.  Suspect she had left hemispheric TIA in setting of hypercarbia and encephalopathy and seems to be improving.  Recommend Eliquis given history of paroxysmal  A-fib and cardiology can add antiplatelet therapy as necessary in addition from cardiac standpoint.  Continue ongoing stroke workup.  Aggressive risk factor modification.  Mobilize out of bed.  Discussed with Dr. Blake Divine.  Stroke team will sign off.  Kindly call for questions kindly call for questions Delia Heady, MD Medical Director [facility] Pager: [phone] 09/03/2023 5:39 PM  To contact Stroke Continuity provider, please refer to [email or web address]. After hours, contact General Neurology

## 2023-09-03 NOTE — Progress Notes (Signed)
Patient had foley catheter removed, no documentation of urine output noted and patient wasn't able to remember if she voided, bladder scanned patient twice and showed 0-34ml in bladder

## 2023-09-04 ENCOUNTER — Encounter (HOSPITAL_COMMUNITY)
Admission: EM | Disposition: A | Discharge status: Home Health | Payer: Self-pay | Source: Home / Self Care | Attending: Internal Medicine

## 2023-09-04 ENCOUNTER — Encounter (HOSPITAL_COMMUNITY): Payer: Self-pay | Admitting: Pulmonary Disease

## 2023-09-04 DIAGNOSIS — I25110 Atherosclerotic heart disease of native coronary artery with unstable angina pectoris: Secondary | ICD-10-CM | POA: Diagnosis not present

## 2023-09-04 DIAGNOSIS — R400 Somnolence: Secondary | ICD-10-CM

## 2023-09-04 DIAGNOSIS — R079 Chest pain, unspecified: Secondary | ICD-10-CM

## 2023-09-04 DIAGNOSIS — R072 Precordial pain: Secondary | ICD-10-CM

## 2023-09-04 HISTORY — PX: RIGHT HEART CATH AND CORONARY ANGIOGRAPHY: CATH118264

## 2023-09-04 LAB — POCT I-STAT EG7
Acid-Base Excess: 3 mmol/L — ABNORMAL HIGH (ref 0.0–2.0)
Acid-Base Excess: 3 mmol/L — ABNORMAL HIGH (ref 0.0–2.0)
Bicarbonate: 28.8 mmol/L — ABNORMAL HIGH (ref 20.0–28.0)
Bicarbonate: 29.7 mmol/L — ABNORMAL HIGH (ref 20.0–28.0)
Calcium, Ion: 1.21 mmol/L (ref 1.15–1.40)
Calcium, Ion: 1.22 mmol/L (ref 1.15–1.40)
HCT: 33 % — ABNORMAL LOW (ref 36.0–46.0)
HCT: 33 % — ABNORMAL LOW (ref 36.0–46.0)
Hemoglobin: 11.2 g/dL — ABNORMAL LOW (ref 12.0–15.0)
Hemoglobin: 11.2 g/dL — ABNORMAL LOW (ref 12.0–15.0)
O2 Saturation: 76 %
O2 Saturation: 77 %
Potassium: 3.9 mmol/L (ref 3.5–5.1)
Potassium: 3.8 mmol/L (ref 3.5–5.1)
Sodium: 143 mmol/L (ref 135–145)
Sodium: 143 mmol/L (ref 135–145)
TCO2: 30 mmol/L (ref 22–32)
TCO2: 31 mmol/L (ref 22–32)
pCO2, Ven: 51 mm[Hg] (ref 44–60)
pCO2, Ven: 53.1 mm[Hg] (ref 44–60)
pH, Ven: 7.36 (ref 7.25–7.43)
pH, Ven: 7.356 (ref 7.25–7.43)
pO2, Ven: 43 mm[Hg] (ref 32–45)
pO2, Ven: 44 mm[Hg] (ref 32–45)

## 2023-09-04 LAB — CBC WITH DIFFERENTIAL/PLATELET
Abs Immature Granulocytes: 0.03 10*3/uL (ref 0.00–0.07)
Basophils Absolute: 0 10*3/uL (ref 0.0–0.1)
Basophils Relative: 1 %
Eosinophils Absolute: 0.4 10*3/uL (ref 0.0–0.5)
Eosinophils Relative: 6 %
HCT: 31.6 % — ABNORMAL LOW (ref 36.0–46.0)
Hemoglobin: 10.4 g/dL — ABNORMAL LOW (ref 12.0–15.0)
Immature Granulocytes: 0 %
Lymphocytes Relative: 28 %
Lymphs Abs: 2.1 10*3/uL (ref 0.7–4.0)
MCH: 28.5 pg (ref 26.0–34.0)
MCHC: 32.9 g/dL (ref 30.0–36.0)
MCV: 86.6 fL (ref 80.0–100.0)
Monocytes Absolute: 0.9 10*3/uL (ref 0.1–1.0)
Monocytes Relative: 12 %
Neutro Abs: 4.1 10*3/uL (ref 1.7–7.7)
Neutrophils Relative %: 53 %
Platelets: 257 10*3/uL (ref 150–400)
RBC: 3.65 MIL/uL — ABNORMAL LOW (ref 3.87–5.11)
RDW: 15.9 % — ABNORMAL HIGH (ref 11.5–15.5)
WBC: 7.6 10*3/uL (ref 4.0–10.5)
nRBC: 0 % (ref 0.0–0.2)

## 2023-09-04 LAB — BASIC METABOLIC PANEL
Anion gap: 10 (ref 5–15)
BUN: 22 mg/dL (ref 8–23)
CO2: 27 mmol/L (ref 22–32)
Calcium: 8.8 mg/dL — ABNORMAL LOW (ref 8.9–10.3)
Chloride: 104 mmol/L (ref 98–111)
Creatinine, Ser: 1.45 mg/dL — ABNORMAL HIGH (ref 0.44–1.00)
GFR, Estimated: 38 mL/min — ABNORMAL LOW (ref >60)
Glucose, Bld: 106 mg/dL — ABNORMAL HIGH (ref 70–99)
Potassium: 3.5 mmol/L (ref 3.5–5.1)
Sodium: 141 mmol/L (ref 135–145)

## 2023-09-04 LAB — POCT I-STAT 7, (LYTES, BLD GAS, ICA,H+H)
Acid-Base Excess: 2 mmol/L (ref 0.0–2.0)
Bicarbonate: 27.5 mmol/L (ref 20.0–28.0)
Calcium, Ion: 1.17 mmol/L (ref 1.15–1.40)
HCT: 33 % — ABNORMAL LOW (ref 36.0–46.0)
Hemoglobin: 11.2 g/dL — ABNORMAL LOW (ref 12.0–15.0)
O2 Saturation: 99 %
Potassium: 3.8 mmol/L (ref 3.5–5.1)
Sodium: 142 mmol/L (ref 135–145)
TCO2: 29 mmol/L (ref 22–32)
pCO2 arterial: 44.1 mm[Hg] (ref 32–48)
pH, Arterial: 7.402 (ref 7.35–7.45)
pO2, Arterial: 129 mm[Hg] — ABNORMAL HIGH (ref 83–108)

## 2023-09-04 LAB — URINALYSIS, ROUTINE W REFLEX MICROSCOPIC
Bilirubin Urine: NEGATIVE
Glucose, UA: NEGATIVE mg/dL
Ketones, ur: NEGATIVE mg/dL
Nitrite: NEGATIVE
Protein, ur: NEGATIVE mg/dL
Specific Gravity, Urine: 1.008 (ref 1.005–1.030)
pH: 7 (ref 5.0–8.0)

## 2023-09-04 SURGERY — RIGHT HEART CATH AND CORONARY ANGIOGRAPHY
Anesthesia: LOCAL

## 2023-09-04 MED ORDER — FENTANYL CITRATE (PF) 100 MCG/2ML IJ SOLN
INTRAMUSCULAR | Status: DC | PRN
  Administered 2023-09-04: 25 ug via INTRAVENOUS

## 2023-09-04 MED ORDER — SODIUM CHLORIDE 0.9 % WEIGHT BASED INFUSION: 1.0000 mL/kg/h | INTRAVENOUS | Status: DC

## 2023-09-04 MED ORDER — METHYLPREDNISOLONE SODIUM SUCC 125 MG IJ SOLR
125.0000 mg | Freq: Once | INTRAMUSCULAR | Status: AC
  Administered 2023-09-04: 125 mg via INTRAVENOUS
  Filled 2023-09-04: qty 2

## 2023-09-04 MED ORDER — FLUTICASONE PROPIONATE 50 MCG/ACT NA SUSP
2.0000 | Freq: Every day | NASAL | Status: DC
  Administered 2023-09-04 – 2023-09-05 (×2): 2 via NASAL
  Filled 2023-09-04: qty 16

## 2023-09-04 MED ORDER — SODIUM CHLORIDE 0.9% FLUSH
3.0000 mL | Freq: Two times a day (BID) | INTRAVENOUS | Status: DC
  Administered 2023-09-04 – 2023-09-05 (×2): 3 mL via INTRAVENOUS

## 2023-09-04 MED ORDER — HEPARIN SODIUM (PORCINE) 1000 UNIT/ML IJ SOLN
INTRAMUSCULAR | Status: DC | PRN
  Administered 2023-09-04: 5000 [IU] via INTRAVENOUS

## 2023-09-04 MED ORDER — IOHEXOL 350 MG/ML SOLN
INTRAVENOUS | Status: DC | PRN
  Administered 2023-09-04: 40 mL

## 2023-09-04 MED ORDER — LIDOCAINE HCL (PF) 1 % IJ SOLN
INTRAMUSCULAR | Status: DC | PRN
  Administered 2023-09-04 (×2): 2 mL via INTRADERMAL

## 2023-09-04 MED ORDER — LIDOCAINE HCL (PF) 1 % IJ SOLN
INTRAMUSCULAR | Status: AC
  Filled 2023-09-04: qty 30

## 2023-09-04 MED ORDER — MIDAZOLAM HCL 2 MG/2ML IJ SOLN
INTRAMUSCULAR | Status: DC | PRN
  Administered 2023-09-04: 1 mg via INTRAVENOUS

## 2023-09-04 MED ORDER — GUAIFENESIN ER 600 MG PO TB12
600.0000 mg | ORAL_TABLET | Freq: Two times a day (BID) | ORAL | Status: DC
  Administered 2023-09-04 – 2023-09-05 (×3): 600 mg via ORAL
  Filled 2023-09-04 (×3): qty 1

## 2023-09-04 MED ORDER — VERAPAMIL HCL 2.5 MG/ML IV SOLN
INTRAVENOUS | Status: DC | PRN
  Administered 2023-09-04: 10 mL via INTRA_ARTERIAL

## 2023-09-04 MED ORDER — HEPARIN (PORCINE) IN NACL 1000-0.9 UT/500ML-% IV SOLN
INTRAVENOUS | Status: DC | PRN
  Administered 2023-09-04 (×2): 500 mL

## 2023-09-04 MED ORDER — SODIUM CHLORIDE 0.9 % IV SOLN: 250.0000 mL | INTRAVENOUS | Status: DC | PRN

## 2023-09-04 MED ORDER — MIDAZOLAM HCL 2 MG/2ML IJ SOLN
INTRAMUSCULAR | Status: AC
  Filled 2023-09-04: qty 2

## 2023-09-04 MED ORDER — SODIUM CHLORIDE 0.9 % WEIGHT BASED INFUSION
3.0000 mL/kg/h | INTRAVENOUS | Status: DC
  Administered 2023-09-04: 3 mL/kg/h via INTRAVENOUS

## 2023-09-04 MED ORDER — DIPHENHYDRAMINE HCL 50 MG/ML IJ SOLN
25.0000 mg | Freq: Once | INTRAMUSCULAR | Status: AC
  Administered 2023-09-04: 25 mg via INTRAVENOUS
  Filled 2023-09-04: qty 1

## 2023-09-04 MED ORDER — FENTANYL CITRATE (PF) 100 MCG/2ML IJ SOLN
INTRAMUSCULAR | Status: AC
  Filled 2023-09-04: qty 2

## 2023-09-04 MED ORDER — ASPIRIN 81 MG PO CHEW
81.0000 mg | CHEWABLE_TABLET | Freq: Once | ORAL | Status: AC
  Administered 2023-09-04: 81 mg via ORAL
  Filled 2023-09-04: qty 1

## 2023-09-04 MED ORDER — SODIUM CHLORIDE 0.9% FLUSH: 3.0000 mL | INTRAVENOUS | Status: DC | PRN

## 2023-09-04 SURGICAL SUPPLY — 13 items
CATH BALLN WEDGE 5F 110CM (CATHETERS) IMPLANT
CATH INFINITI 5 FR JL3.5 (CATHETERS) IMPLANT
CATH INFINITI AMBI 5FR JK (CATHETERS) IMPLANT
CATH INFINITI JR4 5F (CATHETERS) IMPLANT
DEVICE RAD COMP TR BAND LRG (VASCULAR PRODUCTS) IMPLANT
GLIDESHEATH SLEND SS 6F .021 (SHEATH) IMPLANT
GUIDEWIRE INQWIRE 1.5J.035X260 (WIRE) IMPLANT
INQWIRE 1.5J .035X260CM (WIRE) ×2
MAT PREVALON FULL STRYKER (MISCELLANEOUS) IMPLANT
PACK CARDIAC CATHETERIZATION (CUSTOM PROCEDURE TRAY) ×2 IMPLANT
SET ATX-X65L (MISCELLANEOUS) IMPLANT
SHEATH GLIDE SLENDER 4/5FR (SHEATH) IMPLANT
WIRE HI TORQ VERSACORE-J 145CM (WIRE) IMPLANT

## 2023-09-04 NOTE — Progress Notes (Signed)
OT Cancellation Note  Patient Details Name: Sherry Fitzpatrick MRN: [id number] DOB: Oct 19, 1950   Cancelled Treatment:    Reason Eval/Treat Not Completed: Medical issues which prohibited therapy Per RN patient recently receiving benadryl as well as is awaiting cardiac cath. Patient lethargic currently. RN requesting OT hold until tomorrow. OT will follow back when medically appropriate.   Pollyann Glen E. Jareb Radoncic, OTR/L Acute Rehabilitation Services [phone]   Cherlyn Cushing 09/04/2023, 1:14 PM

## 2023-09-04 NOTE — Progress Notes (Signed)
[facility]  PROGRESS NOTE  Sherry Fitzpatrick GEX:[id number] DOB: 1951/01/23 DOA: 08/31/2023 PCP: Shon Hale, MD   Brief HPI:   72 year old gentleman with prior history of COPD, diabetes, coronary artery disease obstructive sleep apnea, GERD presents with chest pain and shortness of breath along with some dizziness.  She was started on Antivert and Zofran to address vertigo and was being set up for further cardiac evaluation.  But on taking Antivert and Zofran patient became more sleepy confused and presented to ED with acute metabolic encephalopathy on 11/16 was found to be in decompensated heart failure hypercarbia.  MRI of the brain showed small punctate focus in right lentiform nucleus consistent with tiny infarct. She was transferred to [facility] on 11/18.   Neurology and cardiology on board     Assessment/Plan:   Acute metabolic encephalopathy probably secondary to Zofran, meclizine, hypercarbia and CVA Resolved.  Avoid sedating medications -Transient somnolence today after she she received IV Benadryl     Acute CVA Single punctate focus in right lentiform nucleus consistent with tiny infarct. Neurology was consulted, suspect she had left hemispheric TIA in the setting of hypercarbia and encephalopathy -Recommend to resume Eliquis for paroxysmal A-fib -Cardiology can add antiplatelet if needed EEG showed continuous generalized slowing no seizure activity seen. Carotid duplex unremarkable, Echo- does not show any intracardiac thrombus.  Therapy evaluations are pending LDL is 109, Hemoglobin A1c is 7.       Chest pain Appears to have resolved History of coronary artery disease s/p DES S in LAD in 2021, in view of her presentation and chest pain, she is scheduled for cardiac cath today Further recommendations per cardiology     Paroxysmal atrial fibrillation Rate controlled with amiodarone 200 mg daily and Coreg 6.25 mg twice daily Cardiology recommends cardiac  monitor at the time of discharge -Neurology recommends to start apixaban Follow-up cardiology as outpatient     Anemia of chronic disease:  Hemoglobin around 10. Monitor.      Hypertension Blood pressure parameters are optimal  patient currently on hydralazine 25 mg 3 times daily, Imdur 120 mg daily, amlodipine 5 mg daily and Coreg 6.25 mg twice daily.     AKI Unclear what her baseline creatinine is,  creatinine ranging from 1.1 to 2. Admitted with a creatinine of 2 this admission, improved to 1.45 continue to monitor.  Avoid nephrotoxins.      History of COPD No wheezing heard  currently on Pulmicort nebulizer   OSA Recommend outpatient follow up with pulmonology for sleep study.    Body mass index is 39.86 kg/m. Obesity Recommend outpatient with PCP .    Estimated body mass index is 39.86 kg/m as calculated from the following:   Height as of 08/28/23: 5\' 4"  (1.626 m).   Weight as of this encounter: 105.3 kg.    Medications     amiodarone  200 mg Oral Daily   amLODipine  5 mg Oral Daily   arformoterol  15 mcg Nebulization BID   atorvastatin  40 mg Oral Daily   budesonide (PULMICORT) nebulizer solution  0.5 mg Nebulization BID   carvedilol  6.25 mg Oral BID WC   Chlorhexidine Gluconate Cloth  6 each Topical Daily   diphenhydrAMINE  25 mg Intravenous Once   enoxaparin (LOVENOX) injection  50 mg Subcutaneous Q24H   hydrALAZINE  25 mg Oral Q8H   isosorbide mononitrate  120 mg Oral Daily   methylPREDNISolone (SOLU-MEDROL) injection  125 mg Intravenous Once  mupirocin ointment  1 Application Nasal BID   pantoprazole  40 mg Oral Q1200   sertraline  25 mg Oral Daily   ticagrelor  90 mg Oral BID     Data Reviewed:   CBG:  Recent Labs  Lab 08/31/23 2327 09/01/23 0326 09/01/23 0739 09/01/23 1106 09/02/23 0814  GLUCAP 93 85 92 90 96    SpO2: 98 % O2 Flow Rate (L/min): 2 L/min FiO2 (%): 24 %    Vitals:   09/03/23 2348 09/04/23 0545 09/04/23 0733  09/04/23 0812  BP: (!) 152/76 (!) 163/74 (!) 148/64   Pulse: (!) 56 (!) 57 62 (!) 57  Resp: 15 20 14 16   Temp: 98.5 F (36.9 C) 98.2 F (36.8 C) 98.3 F (36.8 C)   TempSrc: Oral Oral Oral   SpO2: 98% 98% 98%   Weight:  105.4 kg    Height:          Data Reviewed:  Basic Metabolic Panel: Recent Labs  Lab 08/28/23 1006 08/28/23 1006 08/31/23 1146 08/31/23 1151 08/31/23 1222 09/01/23 0658 09/02/23 0308 09/03/23 1107 09/04/23 0216  NA 139  --  139   < > 140 141 140 139 141  K 4.2  --  5.4*   < > 4.9 4.0 4.2 3.7 3.5  CL 101  --  103  --  105 104 102 101 104  CO2 22  --  27  --   --  29 26 29 27   GLUCOSE 142*   < > 138*  --  135* 97 107* 101* 106*  BUN 18   < > 27*  --  30* 28* 34* 27* 22  CREATININE 1.29*  --  2.07*  --  2.00* 1.90* 1.91* 1.72* 1.45*  CALCIUM 9.2  --  8.7*  --   --  8.8* 8.5* 8.9 8.8*  MG 2.1  --   --   --   --   --   --   --   --    < > = values in this interval not displayed.    CBC: Recent Labs  Lab 08/28/23 1006 08/31/23 1146 08/31/23 1151 08/31/23 1222 09/01/23 0658 09/02/23 0308 09/03/23 1107 09/04/23 0216  WBC 5.8 8.0  --   --  6.6 8.5 8.0 7.6  NEUTROABS 2.9 5.5  --   --   --   --   --  4.1  HGB 12.1 11.1*   < > 12.6 10.8* 10.0* 10.6* 10.4*  HCT 37.3 35.3*   < > 37.0 34.0* 32.4* 32.7* 31.6*  MCV 88 92.2  --   --  90.2 89.5 87.9 86.6  PLT 294 269  --   --  228 238 272 257   < > = values in this interval not displayed.    LFT Recent Labs  Lab 08/28/23 1006 08/31/23 1146  AST 15 19  ALT 13 11  ALKPHOS 82 53  BILITOT 0.2 0.8  PROT 7.2 7.1  ALBUMIN 3.9 3.3*     Antibiotics: Anti-infectives (From admission, onward)    None        DVT prophylaxis: SCDs  Code Status: Full code  Family Communication: Discussed with patient's family at bedside   CONSULTS cardiology   Subjective   Denies chest pain or shortness of breath.  Became somnolent after she received Solu-Medrol and Benadryl pretreatment, prior to  cath.   Objective    Physical Examination:   General-appears in no acute distress Heart-S1-S2, regular, no  murmur auscultated Lungs-clear to auscultation bilaterally, no wheezing or crackles auscultated Abdomen-soft, nontender, no organomegaly Extremities-no edema in the lower extremities Neuro-alert, oriented x3, no focal deficit noted  Status is: Inpatient:             Meredeth Ide   [facility] If 7PM-7AM, please contact night-coverage at [email or web address], Office  [phone]   09/04/2023, 8:29 AM  LOS: 4 days

## 2023-09-04 NOTE — Progress Notes (Signed)
Rounding Note    Patient Name: Sherry Fitzpatrick Date of Encounter: 09/04/2023  [facility] Cardiologist: Thomasene Ripple, DO   Subjective   No chest pain or dyspnea.   Inpatient Medications    Scheduled Meds:  amiodarone  200 mg Oral Daily   amLODipine  5 mg Oral Daily   arformoterol  15 mcg Nebulization BID   atorvastatin  40 mg Oral Daily   budesonide (PULMICORT) nebulizer solution  0.5 mg Nebulization BID   carvedilol  6.25 mg Oral BID WC   Chlorhexidine Gluconate Cloth  6 each Topical Daily   enoxaparin (LOVENOX) injection  50 mg Subcutaneous Q24H   hydrALAZINE  25 mg Oral Q8H   isosorbide mononitrate  120 mg Oral Daily   mupirocin ointment  1 Application Nasal BID   pantoprazole  40 mg Oral Q1200   sertraline  25 mg Oral Daily   ticagrelor  90 mg Oral BID   Continuous Infusions:  PRN Meds: acetaminophen, melatonin, oxyCODONE, polyethylene glycol, prochlorperazine   Vital Signs    Vitals:   09/03/23 1934 09/03/23 2017 09/03/23 2348 09/04/23 0545  BP: 129/62  (!) 152/76 (!) 163/74  Pulse: (!) 55 (!) 51 (!) 56 (!) 57  Resp: 18 18 15 20   Temp: 98.4 F (36.9 C)  98.5 F (36.9 C) 98.2 F (36.8 C)  TempSrc: Oral  Oral Oral  SpO2: 97% 100% 98% 98%  Weight:    105.4 kg  Height:        Intake/Output Summary (Last 24 hours) at 09/04/2023 0718 Last data filed at 09/04/2023 0550 Gross per 24 hour  Intake 240 ml  Output 950 ml  Net -710 ml      09/04/2023    5:45 AM 09/03/2023    4:09 AM 09/02/2023    3:36 AM  Last 3 Weights  Weight (lbs) 232 lb 4.8 oz 232 lb 3 oz 231 lb 0.7 oz  Weight (kg) 105.371 kg 105.32 kg 104.8 kg      Telemetry    Sinus - Personally Reviewed  ECG    No am EKG - Personally Reviewed  Physical Exam   General: Obese female in NAD.   HEENT: OP clear, mucus membranes moist  SKIN: warm, dry. No rashes. Neuro: No focal deficits  Musculoskeletal: Muscle strength 5/5 all ext  Psychiatric: Mood and affect normal  Neck:  No JVD, no carotid bruits, no thyromegaly, no lymphadenopathy.  Lungs:Clear bilaterally, no wheezes, rhonci, crackles Cardiovascular: Regular rate and rhythm. No murmurs, gallops or rubs. Abdomen:Soft. Bowel sounds present. Non-tender.  Extremities: No lower extremity edema.   Labs    High Sensitivity Troponin:   Recent Labs  Lab 08/31/23 1146 08/31/23 1455  TROPONINIHS 86* 183*     Chemistry Recent Labs  Lab 08/28/23 1006 08/31/23 1146 08/31/23 1151 09/02/23 0308 09/03/23 1107 09/04/23 0216  NA 139 139   < > 140 139 141  K 4.2 5.4*   < > 4.2 3.7 3.5  CL 101 103   < > 102 101 104  CO2 22 27   < > 26 29 27   GLUCOSE 142* 138*   < > 107* 101* 106*  BUN 18 27*   < > 34* 27* 22  CREATININE 1.29* 2.07*   < > 1.91* 1.72* 1.45*  CALCIUM 9.2 8.7*   < > 8.5* 8.9 8.8*  MG 2.1  --   --   --   --   --   PROT 7.2  7.1  --   --   --   --   ALBUMIN 3.9 3.3*  --   --   --   --   AST 15 19  --   --   --   --   ALT 13 11  --   --   --   --   ALKPHOS 82 53  --   --   --   --   BILITOT 0.2 0.8  --   --   --   --   GFRNONAA  --  25*   < > 27* 31* 38*  ANIONGAP  --  9   < > 12 9 10    < > = values in this interval not displayed.    Lipids  Recent Labs  Lab 08/28/23 1006 09/02/23 0308  CHOL 197 159  TRIG 178* 97  HDL 34* 31*  LABVLDL 32  --   LDLCALC 131* 132*  CHOLHDL 5.8* 5.1    Hematology Recent Labs  Lab 09/02/23 0308 09/03/23 1107 09/04/23 0216  WBC 8.5 8.0 7.6  RBC 3.62* 3.72* 3.65*  HGB 10.0* 10.6* 10.4*  HCT 32.4* 32.7* 31.6*  MCV 89.5 87.9 86.6  MCH 27.6 28.5 28.5  MCHC 30.9 32.4 32.9  RDW 16.3* 16.2* 15.9*  PLT 238 272 257   Thyroid  Recent Labs  Lab 08/31/23 1455  TSH 4.530*    BNP Recent Labs  Lab 08/31/23 1146 09/01/23 0658  BNP 377.4* 275.5*    DDimer No results for input(s): "DDIMER" in the last 168 hours.   Radiology     Cardiac Studies   Echo 09/02/23:  1. Left ventricular ejection fraction, by estimation, is 60 to 65%. The  left  ventricle has normal function. The left ventricle has no regional  wall motion abnormalities. There is mild left ventricular hypertrophy.  Left ventricular diastolic parameters  are indeterminate. Elevated left atrial pressure. The E/e' is 24.   2. Right ventricular systolic function is normal. The right ventricular  size is normal.   3. The mitral valve is normal in structure. Mild mitral valve  regurgitation. No evidence of mitral stenosis.   4. The aortic valve is tricuspid. There is moderate calcification of the  aortic valve. There is mild thickening of the aortic valve. Aortic valve  regurgitation is not visualized. Moderate aortic valve stenosis.   Patient Profile     72 y.o. female with history of COPD, DM, CKD, sleep apnea, aortic stenosis, PAF and CAD admitted with acute metabolic encephalopathy secondary to acute hypercarbic respiratory failure, pulmonary edema and CVA with acute on chronic renal failure. She was seen on 09/01/23 by Dr. Lalla Brothers for evaluation of chest pain and dyspnea.   Assessment & Plan    CAD with chest pain:  She has had prior coronary intervention with placement of a drug eluting stent in the LAD in 2021. She c/o chest pain on admission. Mild elevation of troponin in setting of acute respiratory failure. Echo with normal LV function. No chest pain today. There were plans in place for an outpatient cardiac cath given chest pain over the past month concerning for angina.  Will proceed with cardiac catheterization today.  Will treat with solumedrol and Benadryl mid morning for possible dye allergy although I am not sure she has a true dye allergy.    Aortic stenosis: Moderate by echo 09/02/23. Will follow  Paroxysmal atrial fibrillation: She is in sinus today. With recent CVA, will plan  to add Eliquis post cath. If she requires a coronary stent, will need Plavix +/- ASA for one month.   For questions or updates, please contact [facility] Please  consult [email or web address] for contact info under   Signed, Verne Carrow, MD  09/04/2023, 7:18 AM

## 2023-09-04 NOTE — Progress Notes (Signed)
PT Cancellation Note  Patient Details Name: Sherry Fitzpatrick MRN: [id number] DOB: [birth year]   Cancelled Treatment:    Reason Eval/Treat Not Completed: Medical issues which prohibited therapy;Other (comment) (Per RN pt has heart catheterization scheduled for today and was given a Benadryl and has been very lethargic. Will continue to follow up as able and appropriate.)  Harrel Carina, DPT, CLT  Acute Rehabilitation Services Office: [phone] (Secure chat preferred)   Claudia Desanctis 09/04/2023, 1:46 PM

## 2023-09-04 NOTE — H&P (View-Only) (Signed)
Rounding Note    Patient Name: Sherry Fitzpatrick Date of Encounter: 09/04/2023  [facility] Cardiologist: Thomasene Ripple, DO   Subjective   No chest pain or dyspnea.   Inpatient Medications    Scheduled Meds:  amiodarone  200 mg Oral Daily   amLODipine  5 mg Oral Daily   arformoterol  15 mcg Nebulization BID   atorvastatin  40 mg Oral Daily   budesonide (PULMICORT) nebulizer solution  0.5 mg Nebulization BID   carvedilol  6.25 mg Oral BID WC   Chlorhexidine Gluconate Cloth  6 each Topical Daily   enoxaparin (LOVENOX) injection  50 mg Subcutaneous Q24H   hydrALAZINE  25 mg Oral Q8H   isosorbide mononitrate  120 mg Oral Daily   mupirocin ointment  1 Application Nasal BID   pantoprazole  40 mg Oral Q1200   sertraline  25 mg Oral Daily   ticagrelor  90 mg Oral BID   Continuous Infusions:  PRN Meds: acetaminophen, melatonin, oxyCODONE, polyethylene glycol, prochlorperazine   Vital Signs    Vitals:   09/03/23 1934 09/03/23 2017 09/03/23 2348 09/04/23 0545  BP: 129/62  (!) 152/76 (!) 163/74  Pulse: (!) 55 (!) 51 (!) 56 (!) 57  Resp: 18 18 15 20   Temp: 98.4 F (36.9 C)  98.5 F (36.9 C) 98.2 F (36.8 C)  TempSrc: Oral  Oral Oral  SpO2: 97% 100% 98% 98%  Weight:    105.4 kg  Height:        Intake/Output Summary (Last 24 hours) at 09/04/2023 0718 Last data filed at 09/04/2023 0550 Gross per 24 hour  Intake 240 ml  Output 950 ml  Net -710 ml      09/04/2023    5:45 AM 09/03/2023    4:09 AM 09/02/2023    3:36 AM  Last 3 Weights  Weight (lbs) 232 lb 4.8 oz 232 lb 3 oz 231 lb 0.7 oz  Weight (kg) 105.371 kg 105.32 kg 104.8 kg      Telemetry    Sinus - Personally Reviewed  ECG    No am EKG - Personally Reviewed  Physical Exam   General: Obese female in NAD.   HEENT: OP clear, mucus membranes moist  SKIN: warm, dry. No rashes. Neuro: No focal deficits  Musculoskeletal: Muscle strength 5/5 all ext  Psychiatric: Mood and affect normal  Neck:  No JVD, no carotid bruits, no thyromegaly, no lymphadenopathy.  Lungs:Clear bilaterally, no wheezes, rhonci, crackles Cardiovascular: Regular rate and rhythm. No murmurs, gallops or rubs. Abdomen:Soft. Bowel sounds present. Non-tender.  Extremities: No lower extremity edema.   Labs    High Sensitivity Troponin:   Recent Labs  Lab 08/31/23 1146 08/31/23 1455  TROPONINIHS 86* 183*     Chemistry Recent Labs  Lab 08/28/23 1006 08/31/23 1146 08/31/23 1151 09/02/23 0308 09/03/23 1107 09/04/23 0216  NA 139 139   < > 140 139 141  K 4.2 5.4*   < > 4.2 3.7 3.5  CL 101 103   < > 102 101 104  CO2 22 27   < > 26 29 27   GLUCOSE 142* 138*   < > 107* 101* 106*  BUN 18 27*   < > 34* 27* 22  CREATININE 1.29* 2.07*   < > 1.91* 1.72* 1.45*  CALCIUM 9.2 8.7*   < > 8.5* 8.9 8.8*  MG 2.1  --   --   --   --   --   PROT 7.2  7.1  --   --   --   --   ALBUMIN 3.9 3.3*  --   --   --   --   AST 15 19  --   --   --   --   ALT 13 11  --   --   --   --   ALKPHOS 82 53  --   --   --   --   BILITOT 0.2 0.8  --   --   --   --   GFRNONAA  --  25*   < > 27* 31* 38*  ANIONGAP  --  9   < > 12 9 10    < > = values in this interval not displayed.    Lipids  Recent Labs  Lab 08/28/23 1006 09/02/23 0308  CHOL 197 159  TRIG 178* 97  HDL 34* 31*  LABVLDL 32  --   LDLCALC 131* 132*  CHOLHDL 5.8* 5.1    Hematology Recent Labs  Lab 09/02/23 0308 09/03/23 1107 09/04/23 0216  WBC 8.5 8.0 7.6  RBC 3.62* 3.72* 3.65*  HGB 10.0* 10.6* 10.4*  HCT 32.4* 32.7* 31.6*  MCV 89.5 87.9 86.6  MCH 27.6 28.5 28.5  MCHC 30.9 32.4 32.9  RDW 16.3* 16.2* 15.9*  PLT 238 272 257   Thyroid  Recent Labs  Lab 08/31/23 1455  TSH 4.530*    BNP Recent Labs  Lab 08/31/23 1146 09/01/23 0658  BNP 377.4* 275.5*    DDimer No results for input(s): "DDIMER" in the last 168 hours.   Radiology     Cardiac Studies   Echo 09/02/23:  1. Left ventricular ejection fraction, by estimation, is 60 to 65%. The  left  ventricle has normal function. The left ventricle has no regional  wall motion abnormalities. There is mild left ventricular hypertrophy.  Left ventricular diastolic parameters  are indeterminate. Elevated left atrial pressure. The E/e' is 24.   2. Right ventricular systolic function is normal. The right ventricular  size is normal.   3. The mitral valve is normal in structure. Mild mitral valve  regurgitation. No evidence of mitral stenosis.   4. The aortic valve is tricuspid. There is moderate calcification of the  aortic valve. There is mild thickening of the aortic valve. Aortic valve  regurgitation is not visualized. Moderate aortic valve stenosis.   Patient Profile     72 y.o. female with history of COPD, DM, CKD, sleep apnea, aortic stenosis, PAF and CAD admitted with acute metabolic encephalopathy secondary to acute hypercarbic respiratory failure, pulmonary edema and CVA with acute on chronic renal failure. She was seen on 09/01/23 by Dr. Lalla Brothers for evaluation of chest pain and dyspnea.   Assessment & Plan    CAD with chest pain:  She has had prior coronary intervention with placement of a drug eluting stent in the LAD in 2021. She c/o chest pain on admission. Mild elevation of troponin in setting of acute respiratory failure. Echo with normal LV function. No chest pain today. There were plans in place for an outpatient cardiac cath given chest pain over the past month concerning for angina.  Will proceed with cardiac catheterization today.  Will treat with solumedrol and Benadryl mid morning for possible dye allergy although I am not sure she has a true dye allergy.    Aortic stenosis: Moderate by echo 09/02/23. Will follow  Paroxysmal atrial fibrillation: She is in sinus today. With recent CVA, will plan  to add Eliquis post cath. If she requires a coronary stent, will need Plavix +/- ASA for one month.   For questions or updates, please contact [facility] Please  consult [email or web address] for contact info under   Signed, Verne Carrow, MD  09/04/2023, 7:18 AM

## 2023-09-04 NOTE — Interval H&P Note (Signed)
History and Physical Interval Note:  09/04/2023 1:40 PM  Sherry Fitzpatrick  has presented today for surgery, with the diagnosis of cad - angina.  The various methods of treatment have been discussed with the patient and family. After consideration of risks, benefits and other options for treatment, the patient has consented to  Procedure(s): RIGHT/LEFT HEART CATH AND CORONARY ANGIOGRAPHY (N/A) as a surgical intervention.  The patient's history has been reviewed, patient examined, no change in status, stable for surgery.  I have reviewed the patient's chart and labs.  Questions were answered to the patient's satisfaction.     Lorine Bears

## 2023-09-05 ENCOUNTER — Other Ambulatory Visit (HOSPITAL_COMMUNITY): Payer: Self-pay

## 2023-09-05 ENCOUNTER — Encounter (HOSPITAL_COMMUNITY): Payer: Self-pay | Admitting: Cardiovascular Disease

## 2023-09-05 DIAGNOSIS — N189 Chronic kidney disease, unspecified: Secondary | ICD-10-CM | POA: Diagnosis not present

## 2023-09-05 DIAGNOSIS — N179 Acute kidney failure, unspecified: Secondary | ICD-10-CM | POA: Diagnosis not present

## 2023-09-05 DIAGNOSIS — R079 Chest pain, unspecified: Secondary | ICD-10-CM | POA: Diagnosis not present

## 2023-09-05 DIAGNOSIS — I25118 Atherosclerotic heart disease of native coronary artery with other forms of angina pectoris: Secondary | ICD-10-CM | POA: Diagnosis not present

## 2023-09-05 LAB — CULTURE, BLOOD (ROUTINE X 2)
Culture: NO GROWTH
Culture: NO GROWTH
Special Requests: ADEQUATE

## 2023-09-05 LAB — BASIC METABOLIC PANEL
Anion gap: 12 (ref 5–15)
BUN: 24 mg/dL — ABNORMAL HIGH (ref 8–23)
CO2: 22 mmol/L (ref 22–32)
Calcium: 9 mg/dL (ref 8.9–10.3)
Chloride: 102 mmol/L (ref 98–111)
Creatinine, Ser: 1.47 mg/dL — ABNORMAL HIGH (ref 0.44–1.00)
GFR, Estimated: 37 mL/min — ABNORMAL LOW (ref >60)
Glucose, Bld: 257 mg/dL — ABNORMAL HIGH (ref 70–99)
Potassium: 3.8 mmol/L (ref 3.5–5.1)
Sodium: 136 mmol/L (ref 135–145)

## 2023-09-05 MED ORDER — ASPIRIN 81 MG PO CHEW
81.0000 mg | CHEWABLE_TABLET | Freq: Every day | ORAL | 3 refills | Status: AC
  Filled 2023-09-05: qty 30, 30d supply, fill #0

## 2023-09-05 MED ORDER — APIXABAN 5 MG PO TABS
5.0000 mg | ORAL_TABLET | Freq: Two times a day (BID) | ORAL | Status: DC
  Administered 2023-09-05: 5 mg via ORAL
  Filled 2023-09-05: qty 1

## 2023-09-05 MED ORDER — AMLODIPINE BESYLATE 10 MG PO TABS
10.0000 mg | ORAL_TABLET | Freq: Every day | ORAL | Status: DC
  Administered 2023-09-05: 10 mg via ORAL
  Filled 2023-09-05: qty 1

## 2023-09-05 MED ORDER — HYDRALAZINE HCL 20 MG/ML IJ SOLN
10.0000 mg | INTRAMUSCULAR | Status: DC | PRN
  Administered 2023-09-05: 10 mg via INTRAVENOUS
  Filled 2023-09-05: qty 1

## 2023-09-05 MED ORDER — FLUTICASONE PROPIONATE 50 MCG/ACT NA SUSP
2.0000 | Freq: Every day | NASAL | 2 refills | Status: DC
  Filled 2023-09-05: qty 16, 30d supply, fill #0

## 2023-09-05 MED ORDER — APIXABAN 5 MG PO TABS
5.0000 mg | ORAL_TABLET | Freq: Two times a day (BID) | ORAL | 3 refills | Status: AC
  Filled 2023-09-05: qty 60, 30d supply, fill #0

## 2023-09-05 MED ORDER — ASPIRIN 81 MG PO CHEW
81.0000 mg | CHEWABLE_TABLET | Freq: Every day | ORAL | Status: DC
  Administered 2023-09-05: 81 mg via ORAL
  Filled 2023-09-05: qty 1

## 2023-09-05 MED ORDER — AMLODIPINE BESYLATE 10 MG PO TABS
10.0000 mg | ORAL_TABLET | Freq: Every day | ORAL | 3 refills | Status: DC
  Filled 2023-09-05: qty 30, 30d supply, fill #0

## 2023-09-05 MED ORDER — AMIODARONE HCL 200 MG PO TABS
200.0000 mg | ORAL_TABLET | Freq: Every day | ORAL | 3 refills | Status: AC
  Filled 2023-09-05: qty 30, 30d supply, fill #0

## 2023-09-05 NOTE — Discharge Summary (Addendum)
Physician Discharge Summary   Patient: Sherry Fitzpatrick MRN: [id number] DOB: 1951/01/17  Admit date:     08/31/2023  Discharge date: 09/05/23  Discharge Physician: Meredeth Ide   PCP: Shon Hale, MD   Recommendations at discharge:   Follow-up PCP in 2 weeks Patient to go home with home health PT Recommend outpatient sleep study with pulmonology; PCP to make referral  Discharge Diagnoses: Active Problems:   Altered mental status  Resolved Problems:   * No resolved hospital problems. *  Hospital Course: 72 year old gentleman with prior history of COPD, diabetes, coronary artery disease obstructive sleep apnea, GERD presents with chest pain and shortness of breath along with some dizziness. She was started on Antivert and Zofran to address vertigo and was being set up for further cardiac evaluation. But on taking Antivert and Zofran patient became more sleepy confused and presented to ED with acute metabolic encephalopathy on 11/16 was found to be in decompensated heart failure hypercarbia. MRI of the brain showed small punctate focus in right lentiform nucleus consistent with tiny infarct. She was transferred to [facility] on 11/18. Neurology and cardiology on board   Assessment and Plan:  Acute metabolic encephalopathy probably secondary to Zofran, meclizine, hypercarbia and CVA Resolved.        Acute CVA Single punctate focus in right lentiform nucleus consistent with tiny infarct. Neurology was consulted, suspect she had left hemispheric TIA in the setting of hypercarbia and encephalopathy -Recommend to resume Eliquis for paroxysmal A-fib -Cardiology can add antiplatelet if needed EEG showed continuous generalized slowing no seizure activity seen. Carotid duplex unremarkable, Echo- does not show any intracardiac thrombus.  Therapy evaluations are pending LDL is 109, Hemoglobin A1c is 7.  Started on Eliquis     Chest pain Appears to have resolved History of coronary  artery disease s/p DES S in LAD in 2021, in view of her presentation and chest pain -Underwent cardiac cath, found to have moderate nonobstructive CAD.  No stent was placed.  Medical management recommended.  Patient will be discharged on aspirin and Eliquis.     Paroxysmal atrial fibrillation Rate controlled with amiodarone 200 mg daily and Coreg 6.25 mg twice daily Cardiology recommends cardiac monitor at the time of discharge -Neurology recommended to start apixaban Follow-up cardiology as outpatient     Anemia of chronic disease:  Hemoglobin around 10.      Hypertension Blood pressure parameters are optimal  patient currently on hydralazine 25 mg 3 times daily, Imdur 120 mg daily, amlodipine 5 mg daily and Coreg 6.25 mg twice daily. -Dose of amlodipine will be changed to 10 mg daily -Will discontinue ARB due to worsening renal function     AKI Unclear what her baseline creatinine is,  creatinine ranging from 1.1 to 2. Admitted with a creatinine of 2 this admission, improved to 1.47 -Discontinue valsartan     History of COPD No wheezing heard  currently on Pulmicort nebulizer   OSA Recommend outpatient follow up with pulmonology for sleep study.   Chronic diastolic heart failure -Continue Lasix 40 mg daily      Consultants:  Procedures performed:  Disposition: Home Diet recommendation:  Discharge Diet Orders (From admission, onward)     Start     Ordered   09/05/23 0000  Diet - low sodium heart healthy        09/05/23 1113           Regular diet DISCHARGE MEDICATION: Allergies as of 09/05/2023  Reactions   Morphine And Codeine Hives, Nausea And Vomiting   Penicillins Hives   Has patient had a PCN reaction causing immediate rash, facial/tongue/throat swelling, SOB or lightheadedness with hypotension: Yes Has patient had a PCN reaction causing severe rash involving mucus membranes or skin necrosis: No Has patient had a PCN reaction that required  hospitalization: No Has patient had a PCN reaction occurring within the last 10 years: No If all of the above answers are "NO", then may proceed with Cephalosporin use.   Percocet [oxycodone-acetaminophen] Nausea And Vomiting   Ace Inhibitors Nausea And Vomiting, Cough   Iodinated Contrast Media Nausea And Vomiting      Iodine Itching   Etodolac Nausea And Vomiting   Meclizine Hcl Other (See Comments)   Oversedated    Tramadol Nausea And Vomiting   Vicodin [hydrocodone-acetaminophen] Nausea And Vomiting        Medication List     STOP taking these medications    clopidogrel 75 MG tablet Commonly known as: PLAVIX   predniSONE 50 MG tablet Commonly known as: DELTASONE   valsartan 320 MG tablet Commonly known as: DIOVAN       TAKE these medications    acetaminophen 325 MG tablet Commonly known as: TYLENOL Take 2 tablets (650 mg total) by mouth every 6 (six) hours as needed (Mild pain). What changed:  when to take this reasons to take this   albuterol 108 (90 Base) MCG/ACT inhaler Commonly known as: VENTOLIN HFA Inhale 2 puffs into the lungs every 6 (six) hours as needed for wheezing.   amiodarone 200 MG tablet Commonly known as: PACERONE Take 1 tablet (200 mg total) by mouth daily. Start taking on: September 06, 2023 What changed:  how much to take when to take this   amLODipine 10 MG tablet Commonly known as: NORVASC Take 1 tablet (10 mg total) by mouth daily. Start taking on: September 06, 2023 What changed:  medication strength how much to take when to take this   apixaban 5 MG Tabs tablet Commonly known as: ELIQUIS Take 1 tablet (5 mg total) by mouth 2 (two) times daily.   aspirin 81 MG chewable tablet Chew 1 tablet (81 mg total) by mouth daily. Start taking on: September 06, 2023 What changed: when to take this   atorvastatin 40 MG tablet Commonly known as: LIPITOR Take 40 mg by mouth at bedtime.   budesonide-formoterol 160-4.5 MCG/ACT  inhaler Commonly known as: Symbicort Inhale 2 puffs into the lungs 2 (two) times daily. What changed: when to take this   carvedilol 6.25 MG tablet Commonly known as: COREG Take 1 tablet (6.25 mg total) by mouth 2 (two) times daily.   ferrous sulfate 325 (65 FE) MG EC tablet Take 325 mg by mouth in the morning.   fluticasone 50 MCG/ACT nasal spray Commonly known as: FLONASE Place 2 sprays into both nostrils daily. Start taking on: September 06, 2023   furosemide 40 MG tablet Commonly known as: LASIX Take 1 tablet (40 mg total) by mouth daily at 2 PM. What changed: when to take this   hydrALAZINE 25 MG tablet Commonly known as: APRESOLINE Take 1 tablet (25 mg total) by mouth every 8 (eight) hours.   ipratropium-albuterol 0.5-2.5 (3) MG/3ML Soln Commonly known as: DUONEB Take 3 mLs by nebulization every 6 (six) hours as needed (wheezing/shortness of breath).   isosorbide mononitrate 120 MG 24 hr tablet Commonly known as: IMDUR Take 120 mg by mouth in the morning.  meclizine 25 MG tablet Commonly known as: ANTIVERT Take 25 mg by mouth 2 (two) times daily as needed (vertigo).   nitroGLYCERIN 0.4 MG SL tablet Commonly known as: NITROSTAT Place 1 tablet (0.4 mg total) under the tongue every 5 (five) minutes x 3 doses as needed for chest pain.   ondansetron 8 MG tablet Commonly known as: ZOFRAN Take 8 mg by mouth 2 (two) times daily as needed for nausea or vomiting (vertigo).   pantoprazole 40 MG tablet Commonly known as: PROTONIX Take 1 tablet (40 mg total) by mouth daily.   polyethylene glycol 17 g packet Commonly known as: MIRALAX / GLYCOLAX Take 17 g by mouth daily as needed for moderate constipation.   potassium chloride SA 20 MEQ tablet Commonly known as: KLOR-CON M Take 1 tablet (20 mEq total) by mouth daily.   sertraline 25 MG tablet Commonly known as: ZOLOFT Take 25 mg by mouth in the morning.   Stool Softener 100 MG capsule Generic drug: Docusate  Sodium Take 100 mg by mouth 2 (two) times daily.        Discharge Exam: Filed Weights   09/03/23 0409 09/04/23 0545 09/05/23 0534  Weight: 105.3 kg 105.4 kg 106.6 kg   General-appears in no acute distress Heart-S1-S2, regular, no murmur auscultated Lungs-clear to auscultation bilaterally, no wheezing or crackles auscultated Abdomen-soft, nontender, no organomegaly Extremities-no edema in the lower extremities Neuro-alert, oriented x3, no focal deficit noted  Condition at discharge: good  The results of significant diagnostics from this hospitalization (including imaging, microbiology, ancillary and laboratory) are listed below for reference.   Imaging Studies: CARDIAC CATHETERIZATION  Result Date: 09/04/2023   Suezanne Jacquet Cx lesion is 20% stenosed.   Mid LAD lesion is 10% stenosed.   Prox LAD lesion is 20% stenosed.   Prox RCA lesion is 30% stenosed.   Mid RCA lesion is 65% stenosed. 1.  Codominant coronary arteries with patent mid LAD stent with minimal restenosis.  Slight progression of mid RCA disease to 65%.  This was 60% on most recent cardiac cath. 2.  Left ventricular angiography was not performed.  EF was normal by echo. 3.  Moderate aortic stenosis by echo.  I did not attempt to cross the aortic valve. 4.  Right heart catheterization showed mildly elevated filling pressures, mild pulmonary hypertension and high normal cardiac output. 5.  Difficult catheterization via the right radial artery due to tortuosity of the right subclavian and innominate artery. Recommendations: Continue medical therapy for coronary artery disease. Recommend resuming oral furosemide tomorrow if renal function remains stable.   ECHOCARDIOGRAM COMPLETE  Result Date: 09/02/2023    ECHOCARDIOGRAM REPORT   Patient Name:   Sherry Fitzpatrick Date of Exam: 09/02/2023 Medical Rec #:  [id number]       Height:       64.0 in Accession #:    [id number]      Weight:       231.0 lb Date of Birth:  12/05/1950        BSA:           2.080 m Patient Age:    72 years        BP:           117/67 mmHg Patient Gender: F               HR:           57 bpm. Exam Location:  Inpatient Procedure: 2D Echo, Color Doppler and Cardiac Doppler Indications:  Stroke  History:        Patient has prior history of Echocardiogram examinations, most                 recent 12/16/2022. Previous Myocardial Infarction and CAD, Stroke                 and COPD, Aortic Valve Disease, Signs/Symptoms:Shortness of                 Breath, Chest Pain and Dizziness/Lightheadedness; Risk                 Factors:Hypertension, Sleep Apnea and Diabetes.  Sonographer:    Milda Smart Referring Phys: [id number] PETER E BABCOCK  Sonographer Comments: Suboptimal subcostal window. Image acquisition challenging due to patient body habitus and Image acquisition challenging due to respiratory motion. IMPRESSIONS  1. Left ventricular ejection fraction, by estimation, is 60 to 65%. The left ventricle has normal function. The left ventricle has no regional wall motion abnormalities. There is mild left ventricular hypertrophy. Left ventricular diastolic parameters are indeterminate. Elevated left atrial pressure. The E/e' is 24.  2. Right ventricular systolic function is normal. The right ventricular size is normal.  3. The mitral valve is normal in structure. Mild mitral valve regurgitation. No evidence of mitral stenosis.  4. The aortic valve is tricuspid. There is moderate calcification of the aortic valve. There is mild thickening of the aortic valve. Aortic valve regurgitation is not visualized. Moderate aortic valve stenosis. Comparison(s): A prior study was performed on 12/16/2022. Moderate AS remains stable, Grade 3 diastolic dysfunction is now indeterminate with elevated LAP. Conclusion(s)/Recommendation(s): No intracardiac source of embolism detected on this transthoracic study. Consider a transesophageal echocardiogram to exclude cardiac source of embolism if clinically indicated.  FINDINGS  Left Ventricle: Left ventricular ejection fraction, by estimation, is 60 to 65%. The left ventricle has normal function. The left ventricle has no regional wall motion abnormalities. The left ventricular internal cavity size was normal in size. There is  mild left ventricular hypertrophy. Left ventricular diastolic parameters are indeterminate. Elevated left atrial pressure. The E/e' is 24. Right Ventricle: The right ventricular size is normal. No increase in right ventricular wall thickness. Right ventricular systolic function is normal. Left Atrium: Left atrial size was normal in size. Right Atrium: Right atrial size was normal in size. Pericardium: There is no evidence of pericardial effusion. Mitral Valve: The mitral valve is normal in structure. Mild mitral valve regurgitation. No evidence of mitral valve stenosis. MV peak gradient, 6.2 mmHg. The mean mitral valve gradient is 2.0 mmHg. Tricuspid Valve: The tricuspid valve is normal in structure. Tricuspid valve regurgitation is mild . No evidence of tricuspid stenosis. Aortic Valve: The aortic valve is tricuspid. There is moderate calcification of the aortic valve. There is mild thickening of the aortic valve. There is mild aortic valve annular calcification. Aortic valve regurgitation is not visualized. Moderate aortic stenosis is present. Aortic valve mean gradient measures 31.0 mmHg. Aortic valve peak gradient measures 50.0 mmHg. Aortic valve area, by VTI measures 1.90 cm. Pulmonic Valve: The pulmonic valve was normal in structure. Pulmonic valve regurgitation is trivial. No evidence of pulmonic stenosis. Aorta: The aortic root and ascending aorta are structurally normal, with no evidence of dilitation. IAS/Shunts: The interatrial septum was not well visualized.  LEFT VENTRICLE PLAX 2D LVIDd:         4.60 cm   Diastology LVIDs:         2.90 cm   LV  e' medial:    4.51 cm/s LV PW:         1.10 cm   LV E/e' medial:  27.3 LV IVS:        1.20 cm   LV  e' lateral:   6.20 cm/s LVOT diam:     2.00 cm   LV E/e' lateral: 19.8 LV SV:         138 LV SV Index:   66 LVOT Area:     3.14 cm  RIGHT VENTRICLE RV S prime:     11.70 cm/s TAPSE (M-mode): 1.8 cm LEFT ATRIUM             Index        RIGHT ATRIUM           Index LA diam:        4.10 cm 1.97 cm/m   RA Area:     16.10 cm LA Vol (A2C):   40.7 ml 19.57 ml/m  RA Volume:   36.50 ml  17.55 ml/m LA Vol (A4C):   58.5 ml 28.12 ml/m LA Biplane Vol: 53.2 ml 25.58 ml/m  AORTIC VALVE AV Area (Vmax):    1.71 cm AV Area (Vmean):   1.64 cm AV Area (VTI):     1.90 cm AV Vmax:           353.50 cm/s AV Vmean:          261.000 cm/s AV VTI:            0.723 m AV Peak Grad:      50.0 mmHg AV Mean Grad:      31.0 mmHg LVOT Vmax:         192.00 cm/s LVOT Vmean:        136.000 cm/s LVOT VTI:          0.438 m LVOT/AV VTI ratio: 0.61  AORTA Ao Root diam: 3.00 cm Ao Asc diam:  3.10 cm MITRAL VALVE                TRICUSPID VALVE MV Area (PHT): 1.88 cm     TR Peak grad:   23.0 mmHg MV Area VTI:   2.91 cm     TR Vmax:        240.00 cm/s MV Peak grad:  6.2 mmHg MV Mean grad:  2.0 mmHg     SHUNTS MV Vmax:       1.25 m/s     Systemic VTI:  0.44 m MV Vmean:      70.1 cm/s    Systemic Diam: 2.00 cm MV Decel Time: 404 msec MR Peak grad: 57.8 mmHg MR Vmax:      380.00 cm/s MV E velocity: 123.00 cm/s MV A velocity: 72.70 cm/s MV E/A ratio:  1.69 Sunit Tolia Electronically signed by Tessa Lerner Signature Date/Time: 09/02/2023/6:11:47 PM    Final    VAS US CAROTID  Result Date: 09/02/2023 Carotid Arterial Duplex Study Patient Name:  Sherry Fitzpatrick  Date of Exam:   09/01/2023 Medical Rec #: [id number]        Accession #:    [id number] Date of Birth: August 13, 1951         Patient Gender: F Patient Age:   52 years Exam Location:  [facility] Procedure:      VAS US CAROTID Referring Phys: Gevena Mart --------------------------------------------------------------------------------  Indications:  CVA. Risk Factors: Hypertension, prior MI,  prior CVA. Performing Technologist: Shona Simpson  Examination Guidelines: A  complete evaluation includes B-mode imaging, spectral Doppler, color Doppler, and power Doppler as needed of all accessible portions of each vessel. Bilateral testing is considered an integral part of a complete examination. Limited examinations for reoccurring indications may be performed as noted.  Right Carotid Findings: +----------+--------+--------+--------+------------------+------------------+           PSV cm/sEDV cm/sStenosisPlaque DescriptionComments           +----------+--------+--------+--------+------------------+------------------+ CCA Prox  75      14                                                   +----------+--------+--------+--------+------------------+------------------+ CCA Distal83      19                                intimal thickening +----------+--------+--------+--------+------------------+------------------+ ICA Prox  93      37      1-39%   heterogenous                         +----------+--------+--------+--------+------------------+------------------+ ICA Distal109     41                                                   +----------+--------+--------+--------+------------------+------------------+ ECA       151     28                                                   +----------+--------+--------+--------+------------------+------------------+ +----------+--------+-------+--------+-------------------+           PSV cm/sEDV cmsDescribeArm Pressure (mmHG) +----------+--------+-------+--------+-------------------+ Subclavian151     0                                  +----------+--------+-------+--------+-------------------+ +---------+--------+--+--------+--+ VertebralPSV cm/s76EDV cm/s20 +---------+--------+--+--------+--+  Left Carotid Findings: +----------+--------+--------+--------+------------------+--------+           PSV cm/sEDV  cm/sStenosisPlaque DescriptionComments +----------+--------+--------+--------+------------------+--------+ CCA Prox  60      12                                         +----------+--------+--------+--------+------------------+--------+ CCA Distal55      15                                         +----------+--------+--------+--------+------------------+--------+ ICA Prox  112     46      40-59%  heterogenous               +----------+--------+--------+--------+------------------+--------+ ICA Mid   93      28                                         +----------+--------+--------+--------+------------------+--------+  ICA Distal87      29                                         +----------+--------+--------+--------+------------------+--------+ ECA       118     9                                          +----------+--------+--------+--------+------------------+--------+ +----------+--------+--------+--------+-------------------+           PSV cm/sEDV cm/sDescribeArm Pressure (mmHG) +----------+--------+--------+--------+-------------------+ [id number]      0                                   +----------+--------+--------+--------+-------------------+ +---------+--------+--+--------+--+ VertebralPSV cm/s55EDV cm/s17 +---------+--------+--+--------+--+   Summary: Right Carotid: Velocities in the right ICA are consistent with a 1-39% stenosis. Left Carotid: Velocities in the left ICA are consistent with a 40-59% stenosis. Vertebrals:  Bilateral vertebral arteries demonstrate antegrade flow. Subclavians: Normal flow hemodynamics were seen in bilateral subclavian              arteries. *See table(s) above for measurements and observations.  Electronically signed by Coral Else MD on 09/02/2023 at 3:37:05 PM.    Final    MR ANGIO HEAD WO CONTRAST  Result Date: 09/01/2023 CLINICAL DATA:  Stroke follow-up. EXAM: MRA HEAD WITHOUT CONTRAST TECHNIQUE:  Angiographic images of the Circle of Willis were acquired using MRA technique without intravenous contrast. COMPARISON:  MRA head 05/28/2020 FINDINGS: Anterior circulation: The internal carotid arteries are patent from skull base to carotid termini with similar appearance of multifocal stenoses, including severe right and moderate to severe left proximal supraclinoid stenoses. ACAs and MCAs are patent without evidence of a proximal branch occlusion or flow limiting M1 stenosis. Severe bilateral A1 stenoses are again seen. There are also severe bilateral M2 stenoses which have likely mildly progressed. No aneurysm is identified. Posterior circulation: The intracranial vertebral arteries are patent to the basilar. A severe right V4 stenosis immediately distal to the origin of the right PICA is unchanged from the prior study. A mild proximal left V4 stenosis is less prominent than on the prior study. Patent PICA and SCA origins are visualized bilaterally. The basilar artery is patent and mildly small in caliber diffusely with mild irregularity but no significant focal stenosis. Posterior communicating arteries are diminutive or absent. Severe bilateral distal P1 and left P2 stenoses appear mildly progressive. No aneurysm is identified. Anatomic variants: None. IMPRESSION: Severe intracranial atherosclerosis as detailed above with mild progression from 2021. No large vessel occlusion. Electronically Signed   By: Sebastian Ache M.D.   On: 09/01/2023 17:13   MR BRAIN WO CONTRAST  Result Date: 09/01/2023 CLINICAL DATA:  Initial evaluation for neuro deficit, stroke suspected. EXAM: MRI HEAD WITHOUT CONTRAST TECHNIQUE: Multiplanar, multiecho pulse sequences of the brain and surrounding structures were obtained without intravenous contrast. COMPARISON:  CT from earlier the same day as well as previous MRI from 08/04/2022 FINDINGS: Brain: Cerebral volume within normal limits for age. Patchy and confluent T2/FLAIR  hyperintensity involving the periventricular deep white matter both cerebral hemispheres as well as the pons, consistent with chronic small vessel ischemic disease, moderately advanced. Few scatter remote lacunar infarcts noted about the right frontal centrum semi  ovale and basal ganglia. Probable tiny remote left cerebellar infarct noted. Single punctate focus of restricted diffusion seen involving the right lentiform nucleus (series 5, image 72). Corresponding signal loss on ADC map (series 6, image 24). Findings suspicious for a tiny acute ischemic infarct. No associated hemorrhage. No other evidence for acute or subacute ischemia. No other acute or chronic intracranial blood products. No mass lesion, midline shift or mass effect. No hydrocephalus or extra-axial fluid collection. Partially empty sella noted. Suprasellar region within normal limits. Vascular: Major intracranial vascular flow voids are maintained. Skull and upper cervical spine: Case of noted within normal limits. Bone marrow signal intensity normal. No scalp soft tissue abnormality. Sinuses/Orbits: Globes and orbital soft tissues within normal limits. Paranasal sinuses are largely clear. Trace right mastoid effusion, of doubtful significance. Other: None. IMPRESSION: 1. Single punctate focus of restricted diffusion involving the right lentiform nucleus, suspicious for a tiny acute ischemic infarct. No associated hemorrhage. 2. Underlying moderately advanced chronic microvascular ischemic disease with a few scattered remote infarcts as above. Electronically Signed   By: Rise Mu M.D.   On: 09/01/2023 00:26   CT Head Wo Contrast  Result Date: 08/31/2023 CLINICAL DATA:  Mental status change, unknown cause. EXAM: CT HEAD WITHOUT CONTRAST TECHNIQUE: Contiguous axial images were obtained from the base of the skull through the vertex without intravenous contrast. RADIATION DOSE REDUCTION: This exam was performed according to the  departmental dose-optimization program which includes automated exposure control, adjustment of the mA and/or kV according to patient size and/or use of iterative reconstruction technique. COMPARISON:  Head MRI 08/04/2022 FINDINGS: Brain: There is no evidence of an acute infarct, intracranial hemorrhage, mass, midline shift, or extra-axial fluid collection. Cerebral volume is within normal limits for age. Patchy hypodensities in the cerebral white matter nonspecific but compatible with moderate chronic small vessel ischemic disease. A chronic lacunar infarct is again seen in the left caudate head. Vascular: Calcified atherosclerosis at the skull base. No hyperdense vessel. Skull: No acute fracture or suspicious osseous lesion. Sinuses/Orbits: Visualized paranasal sinuses and mastoid air cells are clear. Unremarkable orbits. Other: None. IMPRESSION: 1. No evidence of acute intracranial abnormality. 2. Moderate chronic small vessel ischemic disease. Electronically Signed   By: Sebastian Ache M.D.   On: 08/31/2023 15:39   DG Chest Port 1 View  Result Date: 08/31/2023 CLINICAL DATA:  Found unresponsive EXAM: PORTABLE CHEST 1 VIEW COMPARISON:  07/05/2023 FINDINGS: Cardiomegaly with coronary stenting, stable. Interstitial coarsening also seen on prior, likely vascular congestion. No Kerley lines, effusion, air bronchogram, or pneumothorax. IMPRESSION: Cardiomegaly and vascular congestion. No change when compared to prior. Electronically Signed   By: Tiburcio Pea M.D.   On: 08/31/2023 12:22    Microbiology: Results for orders placed or performed during the hospital encounter of 08/31/23  Culture, blood (routine x 2)     Status: None   Collection Time: 08/31/23 11:46 AM   Specimen: BLOOD LEFT ARM  Result Value Ref Range Status   Specimen Description BLOOD LEFT ARM  Final   Special Requests   Final    BOTTLES DRAWN AEROBIC AND ANAEROBIC Blood Culture adequate volume   Culture   Final    NO GROWTH 5  DAYS Performed at [facility], [address] [place], [place] [address]    Report Status 09/05/2023 FINAL  Final  MRSA Next Gen by PCR, Nasal     Status: Abnormal   Collection Time: 08/31/23  3:40 PM   Specimen: Nasal Mucosa; Nasal  Swab  Result Value Ref Range Status   MRSA by PCR Next Gen DETECTED (A) NOT DETECTED Final    Comment: CRITICAL RESULT CALLED TO, READ BACK BY AND VERIFIED WITH: RN Berenice Bouton [id number] BY J RAZZAK, MT (NOTE) The GeneXpert MRSA Assay (FDA approved for NASAL specimens only), is one component of a comprehensive MRSA colonization surveillance program. It is not intended to diagnose MRSA infection nor to guide or monitor treatment for MRSA infections. Test performance is not FDA approved in patients less than 60 years old. Performed at [facility], [address] [place], [place] [address]   Culture, blood (routine x 2)     Status: None   Collection Time: 08/31/23  4:36 PM   Specimen: BLOOD RIGHT HAND  Result Value Ref Range Status   Specimen Description BLOOD RIGHT HAND  Final   Special Requests   Final    BOTTLES DRAWN AEROBIC AND ANAEROBIC Blood Culture results may not be optimal due to an excessive volume of blood received in culture bottles   Culture   Final    NO GROWTH 5 DAYS Performed at [facility], [address], [place], [place] [address]    Report Status 09/05/2023 FINAL  Final    Labs: CBC: Recent Labs  Lab 08/31/23 1146 08/31/23 1151 09/01/23 0658 09/02/23 0308 09/03/23 1107 09/04/23 0216 09/04/23 1403 09/04/23 1404 09/04/23 1409  WBC 8.0  --  6.6 8.5 8.0 7.6  --   --   --   NEUTROABS 5.5  --   --   --   --  4.1  --   --   --   HGB 11.1*   < > 10.8* 10.0* 10.6* 10.4* 11.2* 11.2* 11.2*  HCT 35.3*   < > 34.0* 32.4* 32.7* 31.6* 33.0* 33.0* 33.0*  MCV 92.2  --  90.2 89.5 87.9 86.6  --   --   --   PLT 269  --  228 238 272 257  --   --   --    < > = values in this interval not displayed.   Basic  Metabolic Panel: Recent Labs  Lab 09/01/23 0658 09/02/23 0308 09/03/23 1107 09/04/23 0216 09/04/23 1403 09/04/23 1404 09/04/23 1409 09/05/23 0851  NA 141 140 139 141 142 143 143 136  K 4.0 4.2 3.7 3.5 3.8 3.8 3.9 3.8  CL 104 102 101 104  --   --   --  102  CO2 29 26 29 27   --   --   --  22  GLUCOSE 97 107* 101* 106*  --   --   --  257*  BUN 28* 34* 27* 22  --   --   --  24*  CREATININE 1.90* 1.91* 1.72* 1.45*  --   --   --  1.47*  CALCIUM 8.8* 8.5* 8.9 8.8*  --   --   --  9.0   Liver Function Tests: Recent Labs  Lab 08/31/23 1146  AST 19  ALT 11  ALKPHOS 53  BILITOT 0.8  PROT 7.1  ALBUMIN 3.3*   CBG: Recent Labs  Lab 08/31/23 2327 09/01/23 0326 09/01/23 0739 09/01/23 1106 09/02/23 0814  GLUCAP 93 85 92 90 96    Discharge time spent: greater than 30 minutes.  Signed: Meredeth Ide, MD [facility] 09/05/2023

## 2023-09-05 NOTE — Plan of Care (Signed)
Problem: Education: Goal: Knowledge of General Education information will improve Description: Including pain rating scale, medication(s)/side effects and non-pharmacologic comfort measures Outcome: Progressing   Problem: Health Behavior/Discharge Planning: Goal: Ability to manage health-related needs will improve Outcome: Progressing   Problem: Clinical Measurements: Goal: Ability to maintain clinical measurements within normal limits will improve Outcome: Progressing Goal: Will remain free from infection Outcome: Progressing Goal: Diagnostic test results will improve Outcome: Progressing Goal: Respiratory complications will improve Outcome: Progressing Goal: Cardiovascular complication will be avoided Outcome: Progressing   Problem: Activity: Goal: Risk for activity intolerance will decrease Outcome: Progressing   Problem: Nutrition: Goal: Adequate nutrition will be maintained Outcome: Progressing   Problem: Coping: Goal: Level of anxiety will decrease Outcome: Progressing   Problem: Elimination: Goal: Will not experience complications related to bowel motility Outcome: Progressing Goal: Will not experience complications related to urinary retention Outcome: Progressing   Problem: Pain Management: Goal: General experience of comfort will improve Outcome: Progressing   Problem: Safety: Goal: Ability to remain free from injury will improve Outcome: Progressing   Problem: Skin Integrity: Goal: Risk for impaired skin integrity will decrease Outcome: Progressing   Problem: Education: Goal: Knowledge of disease or condition will improve Outcome: Progressing Goal: Knowledge of secondary prevention will improve (MUST DOCUMENT ALL) Outcome: Progressing Goal: Knowledge of patient specific risk factors will improve Loraine Leriche N/A or DELETE if not current risk factor) Outcome: Progressing   Problem: Ischemic Stroke/TIA Tissue Perfusion: Goal: Complications of ischemic  stroke/TIA will be minimized Outcome: Progressing   Problem: Coping: Goal: Will verbalize positive feelings about self Outcome: Progressing Goal: Will identify appropriate support needs Outcome: Progressing   Problem: Self-Care: Goal: Ability to participate in self-care as condition permits will improve Outcome: Progressing Goal: Verbalization of feelings and concerns over difficulty with self-care will improve Outcome: Progressing Goal: Ability to communicate needs accurately will improve Outcome: Progressing   Problem: Nutrition: Goal: Risk of aspiration will decrease Outcome: Progressing Goal: Dietary intake will improve Outcome: Progressing   Problem: Education: Goal: Knowledge of disease or condition will improve Outcome: Progressing Goal: Knowledge of secondary prevention will improve (MUST DOCUMENT ALL) Outcome: Progressing Goal: Knowledge of patient specific risk factors will improve Loraine Leriche N/A or DELETE if not current risk factor) Outcome: Progressing   Problem: Ischemic Stroke/TIA Tissue Perfusion: Goal: Complications of ischemic stroke/TIA will be minimized Outcome: Progressing   Problem: Coping: Goal: Will verbalize positive feelings about self Outcome: Progressing Goal: Will identify appropriate support needs Outcome: Progressing   Problem: Health Behavior/Discharge Planning: Goal: Ability to manage health-related needs will improve Outcome: Progressing Goal: Goals will be collaboratively established with patient/family Outcome: Progressing   Problem: Self-Care: Goal: Ability to participate in self-care as condition permits will improve Outcome: Progressing Goal: Verbalization of feelings and concerns over difficulty with self-care will improve Outcome: Progressing Goal: Ability to communicate needs accurately will improve Outcome: Progressing   Problem: Nutrition: Goal: Risk of aspiration will decrease Outcome: Progressing Goal: Dietary intake  will improve Outcome: Progressing   Problem: Education: Goal: Understanding of CV disease, CV risk reduction, and recovery process will improve Outcome: Progressing Goal: Individualized Educational Video(s) Outcome: Progressing   Problem: Activity: Goal: Ability to return to baseline activity level will improve Outcome: Progressing   Problem: Cardiovascular: Goal: Ability to achieve and maintain adequate cardiovascular perfusion will improve Outcome: Progressing Goal: Vascular access site(s) Level 0-1 will be maintained Outcome: Progressing   Problem: Health Behavior/Discharge Planning: Goal: Ability to safely manage health-related needs after discharge will improve Outcome: Progressing

## 2023-09-05 NOTE — TOC Transition Note (Signed)
Transition of Care [facility]) - CM/SW Discharge Note   Patient Details  Name: Sherry Fitzpatrick MRN: [id number] Date of Birth: Jun 19, 1951  Transition of Care [facility]) CM/SW Contact:  Harriet Masson, RN Phone Number: 09/05/2023, 11:44 AM   Clinical Narrative:    Patient stable for discharge.  Notified Nicholaus Bloom with [facility] of discharge.  No other TOC needs at this time.    Final next level of care: Home w Home Health Services Barriers to Discharge: Barriers Resolved   Patient Goals and CMS Choice CMS Medicare.gov Compare Post Acute Care list provided to:: Patient Choice offered to / list presented to : Patient  Discharge Placement               home          Discharge Plan and Services Additional resources added to the After Visit Summary for     Discharge Planning Services: CM Consult Post Acute Care Choice: Home Health                    HH Arranged: PT, OT, Speech Therapy HH Agency: [facility]        Social Determinants of Health (SDOH) Interventions SDOH Screenings   Food Insecurity: No Food Insecurity (09/03/2023)  Housing: Low Risk  (09/03/2023)  Transportation Needs: No Transportation Needs (09/03/2023)  Utilities: Not At Risk (09/03/2023)  Tobacco Use: Low Risk  (09/04/2023)     Readmission Risk Interventions    09/05/2023   11:43 AM 07/03/2023   11:10 AM 12/18/2022    2:36 PM  Readmission Risk Prevention Plan  Transportation Screening Complete Complete Complete  PCP or Specialist Appt within 5-7 Days   Complete  PCP or Specialist Appt within 3-5 Days  Complete   Home Care Screening   Complete  Medication Review (RN CM)   Complete  HRI or Home Care Consult  Complete   Social Work Consult for Recovery Care Planning/Counseling  Complete   Palliative Care Screening  Not Applicable   Medication Review Oceanographer) Complete Referral to Pharmacy   PCP or Specialist appointment within 3-5 days of discharge Not Complete     PCP/Specialist Appt Not Complete comments follow up apt in 2 weeks    HRI or Home Care Consult Complete    SW Recovery Care/Counseling Consult Complete    Palliative Care Screening Not Applicable    Skilled Nursing Facility Not Applicable

## 2023-09-05 NOTE — Progress Notes (Signed)
Rounding Note    Patient Name: Sherry Fitzpatrick Date of Encounter: 09/05/2023  [facility] Cardiologist: Thomasene Ripple, DO   Subjective   No events overnight. No complaints. No chest pain or dsypnea.   Inpatient Medications    Scheduled Meds:  amiodarone  200 mg Oral Daily   amLODipine  5 mg Oral Daily   atorvastatin  40 mg Oral Daily   budesonide (PULMICORT) nebulizer solution  0.5 mg Nebulization BID   carvedilol  6.25 mg Oral BID WC   enoxaparin (LOVENOX) injection  50 mg Subcutaneous Q24H   fluticasone  2 spray Each Nare Daily   guaiFENesin  600 mg Oral BID   hydrALAZINE  25 mg Oral Q8H   isosorbide mononitrate  120 mg Oral Daily   mupirocin ointment  1 Application Nasal BID   pantoprazole  40 mg Oral Q1200   sertraline  25 mg Oral Daily   sodium chloride flush  3 mL Intravenous Q12H   ticagrelor  90 mg Oral BID   Continuous Infusions:  sodium chloride     PRN Meds: sodium chloride, acetaminophen, hydrALAZINE, melatonin, oxyCODONE, polyethylene glycol, prochlorperazine, sodium chloride flush   Vital Signs    Vitals:   09/05/23 0200 09/05/23 0215 09/05/23 0456 09/05/23 0534  BP: (!) 169/73 (!) 164/78 (!) 172/92   Pulse: 65 65 65 71  Resp: (!) 24 (!) 21 11 (!) 25  Temp:   98.4 F (36.9 C)   TempSrc:   Oral   SpO2: 95% 96% 96% 96%  Weight:    106.6 kg  Height:        Intake/Output Summary (Last 24 hours) at 09/05/2023 0620 Last data filed at 09/04/2023 2225 Gross per 24 hour  Intake --  Output 100 ml  Net -100 ml      09/05/2023    5:34 AM 09/04/2023    5:45 AM 09/03/2023    4:09 AM  Last 3 Weights  Weight (lbs) 235 lb 232 lb 4.8 oz 232 lb 3 oz  Weight (kg) 106.595 kg 105.371 kg 105.32 kg      Telemetry    Sinus - Personally Reviewed  ECG    No am EKG - Personally Reviewed  Physical Exam   General: Well developed, well nourished, NAD  HEENT: OP clear, mucus membranes moist  SKIN: warm, dry. No rashes. Neuro: No focal  deficits  Musculoskeletal: Muscle strength 5/5 all ext  Psychiatric: Mood and affect normal  Neck: No JVD, no carotid bruits, no thyromegaly, no lymphadenopathy.  Lungs:Clear bilaterally, no wheezes, rhonci, crackles Cardiovascular: Regular rate and rhythm. No murmurs, gallops or rubs. Abdomen:Soft. Bowel sounds present. Non-tender.  Extremities: No lower extremity edema. Pulses are 2 + in the bilateral DP/PT.  Labs    High Sensitivity Troponin:   Recent Labs  Lab 08/31/23 1146 08/31/23 1455  TROPONINIHS 86* 183*     Chemistry Recent Labs  Lab 08/31/23 1146 08/31/23 1151 09/02/23 0308 09/03/23 1107 09/04/23 0216 09/04/23 1403 09/04/23 1404 09/04/23 1409  NA 139   < > 140 139 141 142 143 143  K 5.4*   < > 4.2 3.7 3.5 3.8 3.8 3.9  CL 103   < > 102 101 104  --   --   --   CO2 27   < > 26 29 27   --   --   --   GLUCOSE 138*   < > 107* 101* 106*  --   --   --  BUN 27*   < > 34* 27* 22  --   --   --   CREATININE 2.07*   < > 1.91* 1.72* 1.45*  --   --   --   CALCIUM 8.7*   < > 8.5* 8.9 8.8*  --   --   --   PROT 7.1  --   --   --   --   --   --   --   ALBUMIN 3.3*  --   --   --   --   --   --   --   AST 19  --   --   --   --   --   --   --   ALT 11  --   --   --   --   --   --   --   ALKPHOS 53  --   --   --   --   --   --   --   BILITOT 0.8  --   --   --   --   --   --   --   GFRNONAA 25*   < > 27* 31* 38*  --   --   --   ANIONGAP 9   < > 12 9 10   --   --   --    < > = values in this interval not displayed.    Lipids  Recent Labs  Lab 09/02/23 0308  CHOL 159  TRIG 97  HDL 31*  LDLCALC 109*  CHOLHDL 5.1    Hematology Recent Labs  Lab 09/02/23 0308 09/03/23 1107 09/04/23 0216 09/04/23 1403 09/04/23 1404 09/04/23 1409  WBC 8.5 8.0 7.6  --   --   --   RBC 3.62* 3.72* 3.65*  --   --   --   HGB 10.0* 10.6* 10.4* 11.2* 11.2* 11.2*  HCT 32.4* 32.7* 31.6* 33.0* 33.0* 33.0*  MCV 89.5 87.9 86.6  --   --   --   MCH 27.6 28.5 28.5  --   --   --   MCHC 30.9 32.4  32.9  --   --   --   RDW 16.3* 16.2* 15.9*  --   --   --   PLT 238 272 257  --   --   --    Thyroid  Recent Labs  Lab 08/31/23 1455  TSH 4.530*    BNP Recent Labs  Lab 08/31/23 1146 09/01/23 0658  BNP 377.4* 275.5*    DDimer No results for input(s): "DDIMER" in the last 168 hours.   Radiology     Cardiac Studies   Echo 09/02/23:  1. Left ventricular ejection fraction, by estimation, is 60 to 65%. The  left ventricle has normal function. The left ventricle has no regional  wall motion abnormalities. There is mild left ventricular hypertrophy.  Left ventricular diastolic parameters  are indeterminate. Elevated left atrial pressure. The E/e' is 24.   2. Right ventricular systolic function is normal. The right ventricular  size is normal.   3. The mitral valve is normal in structure. Mild mitral valve  regurgitation. No evidence of mitral stenosis.   4. The aortic valve is tricuspid. There is moderate calcification of the  aortic valve. There is mild thickening of the aortic valve. Aortic valve  regurgitation is not visualized. Moderate aortic valve stenosis.   Patient Profile     72 y.o. female  with history of COPD, DM, CKD, sleep apnea, aortic stenosis, PAF and CAD admitted with acute metabolic encephalopathy secondary to acute hypercarbic respiratory failure, pulmonary edema and CVA with acute on chronic renal failure. She was seen on 09/01/23 by Dr. Lalla Brothers for evaluation of chest pain and dyspnea.   Assessment & Plan    CAD with chest pain:  She has had prior coronary intervention with placement of a drug eluting stent in the LAD in 2021. She c/o chest pain on admission. Mild elevation of troponin in setting of acute respiratory failure. Echo with normal LV function. Cardiac cath 09/04/23 with moderate non-obstructive CAD. No stent was placed. Recommend medical management of CAD. Will stop Brilinta and start ASA. (See below regarding Neuro recs/Eliquis)  Aortic  stenosis: Moderate by echo 09/02/23. Will follow  Paroxysmal atrial fibrillation: Sinus today. Per neurology recs, she is going to be started on Eliquis today. Would stop Brilinta and start ASA 81 mg daily.  Continue Coreg and amiodarone. Since she is going to be on Eliquis, there is no need for a cardiac monitor post discharge.    HTN: Per primary. Would titrate amlodipine today  Acute on chronic kidney disease: Per primary team. BMET today post cath.    Cardiology will sign off. Please call with questions.   For questions or updates, please contact [facility] Please consult [email or web address] for contact info under   Signed, Verne Carrow, MD  09/05/2023, 6:20 AM

## 2023-09-05 NOTE — TOC Benefit Eligibility Note (Signed)
Pharmacy Patient Advocate Encounter  Insurance verification completed.    The patient is insured through  [employer]    Ran test claim for Eliquis. Currently a quantity of 60 is a 30 day supply and the co-pay is $0.00 .   This test claim was processed through [facility]- copay amounts may vary at other pharmacies due to pharmacy/plan contracts, or as the patient moves through the different stages of their insurance plan.

## 2023-09-05 NOTE — Discharge Instructions (Signed)
Will need outpatient sleep study; PCP can make referral

## 2023-09-05 NOTE — Progress Notes (Signed)
Physical Therapy Treatment Patient Details Name: Sherry Fitzpatrick MRN: [id number] DOB: 12-17-1950 Today's Date: 09/05/2023   History of Present Illness 72 year old with prior history of COPD, diabetes, coronary artery disease obstructive sleep apnea, GERD presents with chest pain and shortness of breath along with some dizziness.  She was started on Antivert and Zofran to address vertigo and was being set up for further cardiac evaluation.  But on taking Antivert and Zofran patient became more sleepy confused and presented to ED with acute metabolic encephalopathy on 11/16 was found to be in decompensated heart failure hypercarbia.  MRI of the brain showed small punctate focus in right lentiform nucleus consistent with tiny infarct. Pt currently s/p cardiac catheterization on 09/04/23.    PT Comments  Pt has made great progress towards all goals. Currently supervision to Mod I for all functional mobility without AD. Pt does demonstrate antalgic gait due to pain in the R hip which she reports started a month or two ago and she has seen an MD about pain. Pt has good support from family and would benefit from skilled physical therapy services 3x/weekly initially in order to ensure pt is safe in home environment with functional mobility to address strength and balance to decrease risk for falls, injury and re-hospitalization. Pt tolerated treatment session well and was short of breathe after short community distance gait.     If plan is discharge home, recommend the following: A little help with walking and/or transfers;Help with stairs or ramp for entrance;Supervision due to cognitive status     Equipment Recommendations  None recommended by PT       Precautions / Restrictions Precautions Precautions: Fall Restrictions Weight Bearing Restrictions: No     Mobility  Bed Mobility     General bed mobility comments: pt received in recliner an left in recliner on departure    Transfers Overall  transfer level: Modified independent Equipment used: None Transfers: Sit to/from Stand Sit to Stand: Modified independent (Device/Increase time)           General transfer comment: slight increase in BOS no LOB in standing.    Ambulation/Gait Ambulation/Gait assistance: Supervision Gait Distance (Feet): 200 Feet Assistive device: None Gait Pattern/deviations: Antalgic, Step-through pattern Gait velocity: slightly decreased Gait velocity interpretation: 1.31 - 2.62 ft/sec, indicative of limited community ambulator   General Gait Details: pt reports she has had pain in her R hip for the past month and has some medication for her hip but wasn't taking it becuase of other stuff going on   Stairs Stairs: Yes Stairs assistance: Supervision Stair Management: One rail Left Number of Stairs: 1 General stair comments: per home set up and supervision    Modified Rankin (Stroke Patients Only) Modified Rankin (Stroke Patients Only) Pre-Morbid Rankin Score: No significant disability Modified Rankin: Slight disability     Balance Overall balance assessment: Needs assistance Sitting-balance support: No upper extremity supported Sitting balance-Leahy Scale: Good Sitting balance - Comments: sitting in recliner   Standing balance support: No upper extremity supported, During functional activity Standing balance-Leahy Scale: Fair Standing balance comment: no LOV        Cognition Arousal: Alert Behavior During Therapy: WFL for tasks assessed/performed Overall Cognitive Status: Within Functional Limits for tasks assessed Area of Impairment: Orientation     Orientation Level: Person, Place, Time, Situation       Safety/Judgement: Decreased awareness of safety     General Comments: pt able to state why she is at hospital, month, year, that  she had a procedure yesterday           General Comments General comments (skin integrity, edema, etc.): Pt slightly short of  breathe.      Pertinent Vitals/Pain Pain Assessment Pain Assessment: No/denies pain     PT Goals (current goals can now be found in the care plan section) Acute Rehab PT Goals Patient Stated Goal: didn't state PT Goal Formulation: With patient/family Time For Goal Achievement: 09/16/23 Potential to Achieve Goals: Good Progress towards PT goals: Progressing toward goals    Frequency    Min 1X/week      PT Plan  Continue with current POC       AM-PAC PT "6 Clicks" Mobility   Outcome Measure  Help needed turning from your back to your side while in a flat bed without using bedrails?: None Help needed moving from lying on your back to sitting on the side of a flat bed without using bedrails?: None Help needed moving to and from a bed to a chair (including a wheelchair)?: None Help needed standing up from a chair using your arms (e.g., wheelchair or bedside chair)?: None Help needed to walk in hospital room?: A Little Help needed climbing 3-5 steps with a railing? : A Little 6 Click Score: 22    End of Session Equipment Utilized During Treatment: Gait belt Activity Tolerance: Patient tolerated treatment well Patient left: in chair;with call bell/phone within reach;with family/visitor present Nurse Communication: Mobility status PT Visit Diagnosis: Unsteadiness on feet (R26.81)     Time: 1610-9604 PT Time Calculation (min) (ACUTE ONLY): 11 min  Charges:    $Therapeutic Activity: 8-22 mins PT General Charges $$ ACUTE PT VISIT: 1 Visit                     Harrel Carina, DPT, CLT  Acute Rehabilitation Services Office: [phone] (Secure chat preferred)    Claudia Desanctis 09/05/2023, 10:54 AM

## 2023-09-18 DIAGNOSIS — J4489 Other specified chronic obstructive pulmonary disease: Secondary | ICD-10-CM | POA: Diagnosis not present

## 2023-09-18 DIAGNOSIS — J9622 Acute and chronic respiratory failure with hypercapnia: Secondary | ICD-10-CM | POA: Diagnosis not present

## 2023-09-18 DIAGNOSIS — I480 Paroxysmal atrial fibrillation: Secondary | ICD-10-CM | POA: Diagnosis not present

## 2023-09-18 DIAGNOSIS — I25110 Atherosclerotic heart disease of native coronary artery with unstable angina pectoris: Secondary | ICD-10-CM | POA: Diagnosis not present

## 2023-09-18 DIAGNOSIS — I5022 Chronic systolic (congestive) heart failure: Secondary | ICD-10-CM | POA: Diagnosis not present

## 2023-09-18 DIAGNOSIS — Z7901 Long term (current) use of anticoagulants: Secondary | ICD-10-CM | POA: Diagnosis not present

## 2023-09-18 DIAGNOSIS — E1122 Type 2 diabetes mellitus with diabetic chronic kidney disease: Secondary | ICD-10-CM | POA: Diagnosis not present

## 2023-09-18 DIAGNOSIS — E785 Hyperlipidemia, unspecified: Secondary | ICD-10-CM | POA: Diagnosis not present

## 2023-09-18 DIAGNOSIS — I5033 Acute on chronic diastolic (congestive) heart failure: Secondary | ICD-10-CM | POA: Diagnosis not present

## 2023-09-18 DIAGNOSIS — I2510 Atherosclerotic heart disease of native coronary artery without angina pectoris: Secondary | ICD-10-CM | POA: Diagnosis not present

## 2023-09-18 DIAGNOSIS — K219 Gastro-esophageal reflux disease without esophagitis: Secondary | ICD-10-CM | POA: Diagnosis not present

## 2023-09-18 DIAGNOSIS — N1832 Chronic kidney disease, stage 3b: Secondary | ICD-10-CM | POA: Diagnosis not present

## 2023-09-18 DIAGNOSIS — N179 Acute kidney failure, unspecified: Secondary | ICD-10-CM | POA: Diagnosis not present

## 2023-09-18 DIAGNOSIS — Z7951 Long term (current) use of inhaled steroids: Secondary | ICD-10-CM | POA: Diagnosis not present

## 2023-09-18 DIAGNOSIS — I130 Hypertensive heart and chronic kidney disease with heart failure and stage 1 through stage 4 chronic kidney disease, or unspecified chronic kidney disease: Secondary | ICD-10-CM | POA: Diagnosis not present

## 2023-09-18 DIAGNOSIS — D631 Anemia in chronic kidney disease: Secondary | ICD-10-CM | POA: Diagnosis not present

## 2023-09-18 DIAGNOSIS — G9341 Metabolic encephalopathy: Secondary | ICD-10-CM | POA: Diagnosis not present

## 2023-09-18 DIAGNOSIS — G4733 Obstructive sleep apnea (adult) (pediatric): Secondary | ICD-10-CM | POA: Diagnosis not present

## 2023-09-18 DIAGNOSIS — Z556 Problems related to health literacy: Secondary | ICD-10-CM | POA: Diagnosis not present

## 2023-09-23 ENCOUNTER — Ambulatory Visit: Payer: 59 | Admitting: Cardiology

## 2023-09-25 DIAGNOSIS — Z9989 Dependence on other enabling machines and devices: Secondary | ICD-10-CM | POA: Diagnosis not present

## 2023-09-25 DIAGNOSIS — Z8673 Personal history of transient ischemic attack (TIA), and cerebral infarction without residual deficits: Secondary | ICD-10-CM | POA: Diagnosis not present

## 2023-09-25 DIAGNOSIS — I2510 Atherosclerotic heart disease of native coronary artery without angina pectoris: Secondary | ICD-10-CM | POA: Diagnosis not present

## 2023-09-25 DIAGNOSIS — R2681 Unsteadiness on feet: Secondary | ICD-10-CM | POA: Diagnosis not present

## 2023-09-25 DIAGNOSIS — Z79899 Other long term (current) drug therapy: Secondary | ICD-10-CM | POA: Diagnosis not present

## 2023-09-25 DIAGNOSIS — R002 Palpitations: Secondary | ICD-10-CM | POA: Diagnosis not present

## 2023-09-25 DIAGNOSIS — R0681 Apnea, not elsewhere classified: Secondary | ICD-10-CM | POA: Diagnosis not present

## 2023-10-02 DIAGNOSIS — Z7901 Long term (current) use of anticoagulants: Secondary | ICD-10-CM | POA: Diagnosis not present

## 2023-10-02 DIAGNOSIS — I130 Hypertensive heart and chronic kidney disease with heart failure and stage 1 through stage 4 chronic kidney disease, or unspecified chronic kidney disease: Secondary | ICD-10-CM | POA: Diagnosis not present

## 2023-10-02 DIAGNOSIS — J4489 Other specified chronic obstructive pulmonary disease: Secondary | ICD-10-CM | POA: Diagnosis not present

## 2023-10-02 DIAGNOSIS — Z556 Problems related to health literacy: Secondary | ICD-10-CM | POA: Diagnosis not present

## 2023-10-02 DIAGNOSIS — N179 Acute kidney failure, unspecified: Secondary | ICD-10-CM | POA: Diagnosis not present

## 2023-10-02 DIAGNOSIS — D631 Anemia in chronic kidney disease: Secondary | ICD-10-CM | POA: Diagnosis not present

## 2023-10-02 DIAGNOSIS — I5022 Chronic systolic (congestive) heart failure: Secondary | ICD-10-CM | POA: Diagnosis not present

## 2023-10-02 DIAGNOSIS — N1832 Chronic kidney disease, stage 3b: Secondary | ICD-10-CM | POA: Diagnosis not present

## 2023-10-02 DIAGNOSIS — G9341 Metabolic encephalopathy: Secondary | ICD-10-CM | POA: Diagnosis not present

## 2023-10-02 DIAGNOSIS — G4733 Obstructive sleep apnea (adult) (pediatric): Secondary | ICD-10-CM | POA: Diagnosis not present

## 2023-10-02 DIAGNOSIS — Z7951 Long term (current) use of inhaled steroids: Secondary | ICD-10-CM | POA: Diagnosis not present

## 2023-10-02 DIAGNOSIS — I25110 Atherosclerotic heart disease of native coronary artery with unstable angina pectoris: Secondary | ICD-10-CM | POA: Diagnosis not present

## 2023-10-02 DIAGNOSIS — I2510 Atherosclerotic heart disease of native coronary artery without angina pectoris: Secondary | ICD-10-CM | POA: Diagnosis not present

## 2023-10-02 DIAGNOSIS — E1122 Type 2 diabetes mellitus with diabetic chronic kidney disease: Secondary | ICD-10-CM | POA: Diagnosis not present

## 2023-10-02 DIAGNOSIS — I5033 Acute on chronic diastolic (congestive) heart failure: Secondary | ICD-10-CM | POA: Diagnosis not present

## 2023-10-02 DIAGNOSIS — K219 Gastro-esophageal reflux disease without esophagitis: Secondary | ICD-10-CM | POA: Diagnosis not present

## 2023-10-02 DIAGNOSIS — I480 Paroxysmal atrial fibrillation: Secondary | ICD-10-CM | POA: Diagnosis not present

## 2023-10-02 DIAGNOSIS — J9622 Acute and chronic respiratory failure with hypercapnia: Secondary | ICD-10-CM | POA: Diagnosis not present

## 2023-10-02 DIAGNOSIS — E785 Hyperlipidemia, unspecified: Secondary | ICD-10-CM | POA: Diagnosis not present

## 2023-10-04 ENCOUNTER — Encounter: Payer: Self-pay | Admitting: Emergency Medicine

## 2023-10-04 DIAGNOSIS — Z7951 Long term (current) use of inhaled steroids: Secondary | ICD-10-CM | POA: Diagnosis not present

## 2023-10-04 DIAGNOSIS — G4733 Obstructive sleep apnea (adult) (pediatric): Secondary | ICD-10-CM | POA: Diagnosis not present

## 2023-10-04 DIAGNOSIS — I2510 Atherosclerotic heart disease of native coronary artery without angina pectoris: Secondary | ICD-10-CM | POA: Diagnosis not present

## 2023-10-04 DIAGNOSIS — I480 Paroxysmal atrial fibrillation: Secondary | ICD-10-CM | POA: Diagnosis not present

## 2023-10-04 DIAGNOSIS — Z7901 Long term (current) use of anticoagulants: Secondary | ICD-10-CM | POA: Diagnosis not present

## 2023-10-04 DIAGNOSIS — N1832 Chronic kidney disease, stage 3b: Secondary | ICD-10-CM | POA: Diagnosis not present

## 2023-10-04 DIAGNOSIS — Z556 Problems related to health literacy: Secondary | ICD-10-CM | POA: Diagnosis not present

## 2023-10-04 DIAGNOSIS — I5022 Chronic systolic (congestive) heart failure: Secondary | ICD-10-CM | POA: Diagnosis not present

## 2023-10-04 DIAGNOSIS — J9622 Acute and chronic respiratory failure with hypercapnia: Secondary | ICD-10-CM | POA: Diagnosis not present

## 2023-10-04 DIAGNOSIS — N179 Acute kidney failure, unspecified: Secondary | ICD-10-CM | POA: Diagnosis not present

## 2023-10-04 DIAGNOSIS — J4489 Other specified chronic obstructive pulmonary disease: Secondary | ICD-10-CM | POA: Diagnosis not present

## 2023-10-04 DIAGNOSIS — K219 Gastro-esophageal reflux disease without esophagitis: Secondary | ICD-10-CM | POA: Diagnosis not present

## 2023-10-04 DIAGNOSIS — D631 Anemia in chronic kidney disease: Secondary | ICD-10-CM | POA: Diagnosis not present

## 2023-10-04 DIAGNOSIS — I130 Hypertensive heart and chronic kidney disease with heart failure and stage 1 through stage 4 chronic kidney disease, or unspecified chronic kidney disease: Secondary | ICD-10-CM | POA: Diagnosis not present

## 2023-10-04 DIAGNOSIS — I25110 Atherosclerotic heart disease of native coronary artery with unstable angina pectoris: Secondary | ICD-10-CM | POA: Diagnosis not present

## 2023-10-04 DIAGNOSIS — G9341 Metabolic encephalopathy: Secondary | ICD-10-CM | POA: Diagnosis not present

## 2023-10-04 DIAGNOSIS — E1122 Type 2 diabetes mellitus with diabetic chronic kidney disease: Secondary | ICD-10-CM | POA: Diagnosis not present

## 2023-10-04 DIAGNOSIS — I5033 Acute on chronic diastolic (congestive) heart failure: Secondary | ICD-10-CM | POA: Diagnosis not present

## 2023-10-04 DIAGNOSIS — E785 Hyperlipidemia, unspecified: Secondary | ICD-10-CM | POA: Diagnosis not present

## 2023-10-11 DIAGNOSIS — Z556 Problems related to health literacy: Secondary | ICD-10-CM | POA: Diagnosis not present

## 2023-10-11 DIAGNOSIS — J9622 Acute and chronic respiratory failure with hypercapnia: Secondary | ICD-10-CM | POA: Diagnosis not present

## 2023-10-11 DIAGNOSIS — I25110 Atherosclerotic heart disease of native coronary artery with unstable angina pectoris: Secondary | ICD-10-CM | POA: Diagnosis not present

## 2023-10-11 DIAGNOSIS — Z7901 Long term (current) use of anticoagulants: Secondary | ICD-10-CM | POA: Diagnosis not present

## 2023-10-11 DIAGNOSIS — J4489 Other specified chronic obstructive pulmonary disease: Secondary | ICD-10-CM | POA: Diagnosis not present

## 2023-10-11 DIAGNOSIS — E1122 Type 2 diabetes mellitus with diabetic chronic kidney disease: Secondary | ICD-10-CM | POA: Diagnosis not present

## 2023-10-11 DIAGNOSIS — I5033 Acute on chronic diastolic (congestive) heart failure: Secondary | ICD-10-CM | POA: Diagnosis not present

## 2023-10-11 DIAGNOSIS — I480 Paroxysmal atrial fibrillation: Secondary | ICD-10-CM | POA: Diagnosis not present

## 2023-10-11 DIAGNOSIS — I2510 Atherosclerotic heart disease of native coronary artery without angina pectoris: Secondary | ICD-10-CM | POA: Diagnosis not present

## 2023-10-11 DIAGNOSIS — K219 Gastro-esophageal reflux disease without esophagitis: Secondary | ICD-10-CM | POA: Diagnosis not present

## 2023-10-11 DIAGNOSIS — Z7951 Long term (current) use of inhaled steroids: Secondary | ICD-10-CM | POA: Diagnosis not present

## 2023-10-11 DIAGNOSIS — G9341 Metabolic encephalopathy: Secondary | ICD-10-CM | POA: Diagnosis not present

## 2023-10-11 DIAGNOSIS — N1832 Chronic kidney disease, stage 3b: Secondary | ICD-10-CM | POA: Diagnosis not present

## 2023-10-11 DIAGNOSIS — G4733 Obstructive sleep apnea (adult) (pediatric): Secondary | ICD-10-CM | POA: Diagnosis not present

## 2023-10-11 DIAGNOSIS — E785 Hyperlipidemia, unspecified: Secondary | ICD-10-CM | POA: Diagnosis not present

## 2023-10-11 DIAGNOSIS — I130 Hypertensive heart and chronic kidney disease with heart failure and stage 1 through stage 4 chronic kidney disease, or unspecified chronic kidney disease: Secondary | ICD-10-CM | POA: Diagnosis not present

## 2023-10-11 DIAGNOSIS — D631 Anemia in chronic kidney disease: Secondary | ICD-10-CM | POA: Diagnosis not present

## 2023-10-11 DIAGNOSIS — I5022 Chronic systolic (congestive) heart failure: Secondary | ICD-10-CM | POA: Diagnosis not present

## 2023-10-11 DIAGNOSIS — N179 Acute kidney failure, unspecified: Secondary | ICD-10-CM | POA: Diagnosis not present

## 2023-10-17 DIAGNOSIS — J4489 Other specified chronic obstructive pulmonary disease: Secondary | ICD-10-CM | POA: Diagnosis not present

## 2023-10-17 DIAGNOSIS — G4733 Obstructive sleep apnea (adult) (pediatric): Secondary | ICD-10-CM | POA: Diagnosis not present

## 2023-10-17 DIAGNOSIS — E785 Hyperlipidemia, unspecified: Secondary | ICD-10-CM | POA: Diagnosis not present

## 2023-10-17 DIAGNOSIS — G9341 Metabolic encephalopathy: Secondary | ICD-10-CM | POA: Diagnosis not present

## 2023-10-17 DIAGNOSIS — K219 Gastro-esophageal reflux disease without esophagitis: Secondary | ICD-10-CM | POA: Diagnosis not present

## 2023-10-17 DIAGNOSIS — Z7901 Long term (current) use of anticoagulants: Secondary | ICD-10-CM | POA: Diagnosis not present

## 2023-10-17 DIAGNOSIS — N1832 Chronic kidney disease, stage 3b: Secondary | ICD-10-CM | POA: Diagnosis not present

## 2023-10-17 DIAGNOSIS — I480 Paroxysmal atrial fibrillation: Secondary | ICD-10-CM | POA: Diagnosis not present

## 2023-10-17 DIAGNOSIS — E1122 Type 2 diabetes mellitus with diabetic chronic kidney disease: Secondary | ICD-10-CM | POA: Diagnosis not present

## 2023-10-17 DIAGNOSIS — I5022 Chronic systolic (congestive) heart failure: Secondary | ICD-10-CM | POA: Diagnosis not present

## 2023-10-17 DIAGNOSIS — J9622 Acute and chronic respiratory failure with hypercapnia: Secondary | ICD-10-CM | POA: Diagnosis not present

## 2023-10-17 DIAGNOSIS — I2510 Atherosclerotic heart disease of native coronary artery without angina pectoris: Secondary | ICD-10-CM | POA: Diagnosis not present

## 2023-10-17 DIAGNOSIS — Z7951 Long term (current) use of inhaled steroids: Secondary | ICD-10-CM | POA: Diagnosis not present

## 2023-10-17 DIAGNOSIS — I25110 Atherosclerotic heart disease of native coronary artery with unstable angina pectoris: Secondary | ICD-10-CM | POA: Diagnosis not present

## 2023-10-17 DIAGNOSIS — Z556 Problems related to health literacy: Secondary | ICD-10-CM | POA: Diagnosis not present

## 2023-10-17 DIAGNOSIS — I5033 Acute on chronic diastolic (congestive) heart failure: Secondary | ICD-10-CM | POA: Diagnosis not present

## 2023-10-17 DIAGNOSIS — I130 Hypertensive heart and chronic kidney disease with heart failure and stage 1 through stage 4 chronic kidney disease, or unspecified chronic kidney disease: Secondary | ICD-10-CM | POA: Diagnosis not present

## 2023-10-17 DIAGNOSIS — D631 Anemia in chronic kidney disease: Secondary | ICD-10-CM | POA: Diagnosis not present

## 2023-10-17 DIAGNOSIS — N179 Acute kidney failure, unspecified: Secondary | ICD-10-CM | POA: Diagnosis not present

## 2023-10-22 DIAGNOSIS — I5020 Unspecified systolic (congestive) heart failure: Secondary | ICD-10-CM | POA: Diagnosis not present

## 2023-10-22 DIAGNOSIS — E1122 Type 2 diabetes mellitus with diabetic chronic kidney disease: Secondary | ICD-10-CM | POA: Diagnosis not present

## 2023-10-22 DIAGNOSIS — I10 Essential (primary) hypertension: Secondary | ICD-10-CM | POA: Diagnosis not present

## 2023-10-22 DIAGNOSIS — J449 Chronic obstructive pulmonary disease, unspecified: Secondary | ICD-10-CM | POA: Diagnosis not present

## 2023-10-24 DIAGNOSIS — I480 Paroxysmal atrial fibrillation: Secondary | ICD-10-CM | POA: Diagnosis not present

## 2023-10-24 DIAGNOSIS — Z7901 Long term (current) use of anticoagulants: Secondary | ICD-10-CM | POA: Diagnosis not present

## 2023-10-24 DIAGNOSIS — G9341 Metabolic encephalopathy: Secondary | ICD-10-CM | POA: Diagnosis not present

## 2023-10-24 DIAGNOSIS — I2510 Atherosclerotic heart disease of native coronary artery without angina pectoris: Secondary | ICD-10-CM | POA: Diagnosis not present

## 2023-10-24 DIAGNOSIS — J9622 Acute and chronic respiratory failure with hypercapnia: Secondary | ICD-10-CM | POA: Diagnosis not present

## 2023-10-24 DIAGNOSIS — I5033 Acute on chronic diastolic (congestive) heart failure: Secondary | ICD-10-CM | POA: Diagnosis not present

## 2023-10-24 DIAGNOSIS — N1832 Chronic kidney disease, stage 3b: Secondary | ICD-10-CM | POA: Diagnosis not present

## 2023-10-24 DIAGNOSIS — K219 Gastro-esophageal reflux disease without esophagitis: Secondary | ICD-10-CM | POA: Diagnosis not present

## 2023-10-24 DIAGNOSIS — I130 Hypertensive heart and chronic kidney disease with heart failure and stage 1 through stage 4 chronic kidney disease, or unspecified chronic kidney disease: Secondary | ICD-10-CM | POA: Diagnosis not present

## 2023-10-24 DIAGNOSIS — I5022 Chronic systolic (congestive) heart failure: Secondary | ICD-10-CM | POA: Diagnosis not present

## 2023-10-24 DIAGNOSIS — Z556 Problems related to health literacy: Secondary | ICD-10-CM | POA: Diagnosis not present

## 2023-10-24 DIAGNOSIS — E1122 Type 2 diabetes mellitus with diabetic chronic kidney disease: Secondary | ICD-10-CM | POA: Diagnosis not present

## 2023-10-24 DIAGNOSIS — D631 Anemia in chronic kidney disease: Secondary | ICD-10-CM | POA: Diagnosis not present

## 2023-10-24 DIAGNOSIS — J4489 Other specified chronic obstructive pulmonary disease: Secondary | ICD-10-CM | POA: Diagnosis not present

## 2023-10-24 DIAGNOSIS — Z7951 Long term (current) use of inhaled steroids: Secondary | ICD-10-CM | POA: Diagnosis not present

## 2023-10-24 DIAGNOSIS — N179 Acute kidney failure, unspecified: Secondary | ICD-10-CM | POA: Diagnosis not present

## 2023-10-24 DIAGNOSIS — I25110 Atherosclerotic heart disease of native coronary artery with unstable angina pectoris: Secondary | ICD-10-CM | POA: Diagnosis not present

## 2023-10-24 DIAGNOSIS — E785 Hyperlipidemia, unspecified: Secondary | ICD-10-CM | POA: Diagnosis not present

## 2023-10-24 DIAGNOSIS — G4733 Obstructive sleep apnea (adult) (pediatric): Secondary | ICD-10-CM | POA: Diagnosis not present

## 2023-10-31 DIAGNOSIS — G9341 Metabolic encephalopathy: Secondary | ICD-10-CM | POA: Diagnosis not present

## 2023-10-31 DIAGNOSIS — J9622 Acute and chronic respiratory failure with hypercapnia: Secondary | ICD-10-CM | POA: Diagnosis not present

## 2023-10-31 DIAGNOSIS — G4733 Obstructive sleep apnea (adult) (pediatric): Secondary | ICD-10-CM | POA: Diagnosis not present

## 2023-10-31 DIAGNOSIS — K219 Gastro-esophageal reflux disease without esophagitis: Secondary | ICD-10-CM | POA: Diagnosis not present

## 2023-10-31 DIAGNOSIS — N179 Acute kidney failure, unspecified: Secondary | ICD-10-CM | POA: Diagnosis not present

## 2023-10-31 DIAGNOSIS — Z7901 Long term (current) use of anticoagulants: Secondary | ICD-10-CM | POA: Diagnosis not present

## 2023-10-31 DIAGNOSIS — I2510 Atherosclerotic heart disease of native coronary artery without angina pectoris: Secondary | ICD-10-CM | POA: Diagnosis not present

## 2023-10-31 DIAGNOSIS — N1832 Chronic kidney disease, stage 3b: Secondary | ICD-10-CM | POA: Diagnosis not present

## 2023-10-31 DIAGNOSIS — E785 Hyperlipidemia, unspecified: Secondary | ICD-10-CM | POA: Diagnosis not present

## 2023-10-31 DIAGNOSIS — Z556 Problems related to health literacy: Secondary | ICD-10-CM | POA: Diagnosis not present

## 2023-10-31 DIAGNOSIS — D631 Anemia in chronic kidney disease: Secondary | ICD-10-CM | POA: Diagnosis not present

## 2023-10-31 DIAGNOSIS — I130 Hypertensive heart and chronic kidney disease with heart failure and stage 1 through stage 4 chronic kidney disease, or unspecified chronic kidney disease: Secondary | ICD-10-CM | POA: Diagnosis not present

## 2023-10-31 DIAGNOSIS — I5033 Acute on chronic diastolic (congestive) heart failure: Secondary | ICD-10-CM | POA: Diagnosis not present

## 2023-10-31 DIAGNOSIS — I25110 Atherosclerotic heart disease of native coronary artery with unstable angina pectoris: Secondary | ICD-10-CM | POA: Diagnosis not present

## 2023-10-31 DIAGNOSIS — E1122 Type 2 diabetes mellitus with diabetic chronic kidney disease: Secondary | ICD-10-CM | POA: Diagnosis not present

## 2023-10-31 DIAGNOSIS — J4489 Other specified chronic obstructive pulmonary disease: Secondary | ICD-10-CM | POA: Diagnosis not present

## 2023-10-31 DIAGNOSIS — I5022 Chronic systolic (congestive) heart failure: Secondary | ICD-10-CM | POA: Diagnosis not present

## 2023-10-31 DIAGNOSIS — I480 Paroxysmal atrial fibrillation: Secondary | ICD-10-CM | POA: Diagnosis not present

## 2023-10-31 DIAGNOSIS — Z7951 Long term (current) use of inhaled steroids: Secondary | ICD-10-CM | POA: Diagnosis not present

## 2023-11-05 NOTE — Telephone Encounter (Signed)
Made in error

## 2023-11-16 DIAGNOSIS — J4489 Other specified chronic obstructive pulmonary disease: Secondary | ICD-10-CM | POA: Diagnosis not present

## 2023-11-16 DIAGNOSIS — I5033 Acute on chronic diastolic (congestive) heart failure: Secondary | ICD-10-CM | POA: Diagnosis not present

## 2023-11-16 DIAGNOSIS — N179 Acute kidney failure, unspecified: Secondary | ICD-10-CM | POA: Diagnosis not present

## 2023-11-16 DIAGNOSIS — G4733 Obstructive sleep apnea (adult) (pediatric): Secondary | ICD-10-CM | POA: Diagnosis not present

## 2023-11-16 DIAGNOSIS — I25110 Atherosclerotic heart disease of native coronary artery with unstable angina pectoris: Secondary | ICD-10-CM | POA: Diagnosis not present

## 2023-11-16 DIAGNOSIS — K219 Gastro-esophageal reflux disease without esophagitis: Secondary | ICD-10-CM | POA: Diagnosis not present

## 2023-11-16 DIAGNOSIS — E785 Hyperlipidemia, unspecified: Secondary | ICD-10-CM | POA: Diagnosis not present

## 2023-11-16 DIAGNOSIS — I480 Paroxysmal atrial fibrillation: Secondary | ICD-10-CM | POA: Diagnosis not present

## 2023-11-16 DIAGNOSIS — I5022 Chronic systolic (congestive) heart failure: Secondary | ICD-10-CM | POA: Diagnosis not present

## 2023-11-16 DIAGNOSIS — I2510 Atherosclerotic heart disease of native coronary artery without angina pectoris: Secondary | ICD-10-CM | POA: Diagnosis not present

## 2023-11-16 DIAGNOSIS — E1122 Type 2 diabetes mellitus with diabetic chronic kidney disease: Secondary | ICD-10-CM | POA: Diagnosis not present

## 2023-11-16 DIAGNOSIS — D631 Anemia in chronic kidney disease: Secondary | ICD-10-CM | POA: Diagnosis not present

## 2023-11-16 DIAGNOSIS — J9622 Acute and chronic respiratory failure with hypercapnia: Secondary | ICD-10-CM | POA: Diagnosis not present

## 2023-11-16 DIAGNOSIS — Z7901 Long term (current) use of anticoagulants: Secondary | ICD-10-CM | POA: Diagnosis not present

## 2023-11-16 DIAGNOSIS — G9341 Metabolic encephalopathy: Secondary | ICD-10-CM | POA: Diagnosis not present

## 2023-11-16 DIAGNOSIS — Z556 Problems related to health literacy: Secondary | ICD-10-CM | POA: Diagnosis not present

## 2023-11-16 DIAGNOSIS — Z7951 Long term (current) use of inhaled steroids: Secondary | ICD-10-CM | POA: Diagnosis not present

## 2023-11-16 DIAGNOSIS — I130 Hypertensive heart and chronic kidney disease with heart failure and stage 1 through stage 4 chronic kidney disease, or unspecified chronic kidney disease: Secondary | ICD-10-CM | POA: Diagnosis not present

## 2023-11-16 DIAGNOSIS — N1832 Chronic kidney disease, stage 3b: Secondary | ICD-10-CM | POA: Diagnosis not present

## 2023-12-04 DIAGNOSIS — M25512 Pain in left shoulder: Secondary | ICD-10-CM | POA: Diagnosis not present

## 2023-12-04 DIAGNOSIS — I10 Essential (primary) hypertension: Secondary | ICD-10-CM | POA: Diagnosis not present

## 2023-12-04 DIAGNOSIS — R0789 Other chest pain: Secondary | ICD-10-CM | POA: Diagnosis not present

## 2023-12-27 ENCOUNTER — Telehealth: Payer: Self-pay | Admitting: Cardiology

## 2023-12-27 NOTE — Telephone Encounter (Signed)
   Pt c/o of Chest Pain: STAT if active CP, including tightness, pressure, jaw pain, radiating pain to shoulder/upper arm/back, CP unrelieved by Nitro. Symptoms reported of SOB, nausea, vomiting, sweating.  1. Are you having CP right now? Pain in her chest- yes    2. Are you experiencing any other symptoms (ex. SOB, nausea, vomiting, sweating)?  Shortness of breath- not at this time   3. Is your CP continuous or coming and going? comes and goes   4. Have you taken Nitroglycerin?no  How long have you been experiencing CP? Started a few weeks- but its been going on for the last 3 days  patient has an appointment on 01-07-24- daughter was wondering if she needs to be seen sooner   6. If NO CP at time of call then end call with telling Pt to call back or call 911 if Chest pain returns prior to return call from triage team.

## 2023-12-27 NOTE — Telephone Encounter (Signed)
 Spoke to patient's daughter she stated mother has been having chest pain off and on for the past 2 weeks.Stated pain is # 5 at present.Advised she needs to go to ED to be evaluated.

## 2024-01-07 ENCOUNTER — Encounter: Payer: Self-pay | Admitting: Cardiology

## 2024-01-07 ENCOUNTER — Ambulatory Visit: Payer: 59 | Attending: Cardiology | Admitting: Cardiology

## 2024-01-07 ENCOUNTER — Ambulatory Visit

## 2024-01-07 VITALS — BP 135/79 | HR 67 | Ht 63.0 in | Wt 227.6 lb

## 2024-01-07 DIAGNOSIS — I480 Paroxysmal atrial fibrillation: Secondary | ICD-10-CM

## 2024-01-07 DIAGNOSIS — I10 Essential (primary) hypertension: Secondary | ICD-10-CM

## 2024-01-07 DIAGNOSIS — E1100 Type 2 diabetes mellitus with hyperosmolarity without nonketotic hyperglycemic-hyperosmolar coma (NKHHC): Secondary | ICD-10-CM | POA: Diagnosis not present

## 2024-01-07 MED ORDER — RANOLAZINE ER 500 MG PO TB12: 500.0000 mg | ORAL_TABLET | Freq: Two times a day (BID) | ORAL | 3 refills | Status: AC

## 2024-01-07 NOTE — Progress Notes (Unsigned)
 Cardiology Office Note:    Date:  01/10/2024   ID:  Sherry Fitzpatrick, DOB [birth year], MRN [id number]  PCP:  Shon Hale, MD  Cardiologist:  Thomasene Ripple, DO  Electrophysiologist:  None   Referring MD: Shon Hale, *   " I am really short of breath"   History of Present Illness:    Sherry Fitzpatrick is a 73 y.o. female with a hx of COPD, diabetes type 2, CAD s/p DES, moderate aortic stenosis, chronic diastolic heart failure, restrictive cardiomyopathy, CVA, atrial fibrillation not on anticoagulation- unclear why obstructive sleep apnea, GERD,  chronic anemia, obesity, hyperlipidemia and CKD stage 3B.   She  presents with daily chest pain for the past two to three weeks. The pain is described as a pressure sensation, located in the chest area. The patient also reports experiencing shortness of breath and dizziness. The patient's chest pain is musculoskeletal in nature, likely due to a strain or injury. The patient denies any recent changes in medication or lifestyle that could contribute to these symptoms. The patient's daughter is present during the consultation and provides additional information about the patient's symptoms and medication delivery.    Past Medical History:  Diagnosis Date   Acute non-Q wave non-ST elevation myocardial infarction (NSTEMI) (HCC) 05/24/2020   Acute respiratory failure with hypoxia (HCC) 11/22/2019   AKI (acute kidney injury) (HCC) 07/03/2018   Alterations of sensations, late effect of cerebrovascular disease(438.6)    Arthritis    "all over"   Backache, unspecified    Body mass index 40.0-44.9, adult (HCC)    Carpal tunnel syndrome    Cerebral thrombosis with cerebral infarction 05/29/2020   Dizziness and giddiness    Esophageal reflux    Generalized pain    Gout attack 03/28/2020   Headache    "had alot til I had pituitary tumor removed"   Heart murmur    Hypertensive encephalopathy 03/28/2020   Mild cognitive impairment  07/16/2022   NSTEMI (non-ST elevated myocardial infarction) (HCC)    Other abnormal glucose    Other and unspecified hyperlipidemia    Other malaise and fatigue    Other postprocedural status(V45.89)    Other symptoms involving cardiovascular system    Shock (HCC)    Sleep apnea    "did test; they wanted me to get a CPAP but I never did get it" (04/14/2015) Does use inhaler at night (01 2021)   Stroke (HCC) 06/2003; 02/2010   "minor; minor" ; denies residual on 04/14/2015   Unspecified disorder of the pituitary gland and its hypothalamic control    Unspecified essential hypertension    Unspecified visual disturbance     Past Surgical History:  Procedure Laterality Date   CARPAL TUNNEL RELEASE Left ~ 2014   CORONARY STENT INTERVENTION N/A 05/24/2020   Procedure: CORONARY STENT INTERVENTION;  Surgeon: Corky Crafts, MD;  Location: [facility];  Service: Cardiovascular;  Laterality: N/A;   JOINT REPLACEMENT     LEFT HEART CATH AND CORONARY ANGIOGRAPHY N/A 05/24/2020   Procedure: LEFT HEART CATH AND CORONARY ANGIOGRAPHY;  Surgeon: Rinaldo Cloud, MD;  Location: [facility];  Service: Cardiovascular;  Laterality: N/A;   RIGHT HEART CATH AND CORONARY ANGIOGRAPHY N/A 09/04/2023   Procedure: RIGHT HEART CATH AND CORONARY ANGIOGRAPHY;  Surgeon: Iran Ouch, MD;  Location: [facility];  Service: Cardiovascular;  Laterality: N/A;   TOTAL HIP ARTHROPLASTY Right 11/06/2010   TRANSPHENOIDAL / TRANSNASAL HYPOPHYSECTOMY / RESECTION  PITUITARY TUMOR  06/13/2012   TUBAL LIGATION  1974    Current Medications: Current Meds  Medication Sig   acetaminophen (TYLENOL) 325 MG tablet Take 2 tablets (650 mg total) by mouth every 6 (six) hours as needed (Mild pain). (Patient taking differently: Take 650 mg by mouth as needed for mild pain (pain score 1-3) or moderate pain (pain score 4-6).)   albuterol (VENTOLIN HFA) 108 (90 Base) MCG/ACT inhaler Inhale 2 puffs into the lungs every 6  (six) hours as needed for wheezing.   amiodarone (PACERONE) 200 MG tablet Take 1 tablet (200 mg total) by mouth daily.   amLODipine (NORVASC) 10 MG tablet Take 1 tablet (10 mg total) by mouth daily.   apixaban (ELIQUIS) 5 MG TABS tablet Take 1 tablet (5 mg total) by mouth 2 (two) times daily.   aspirin 81 MG chewable tablet Chew 1 tablet (81 mg total) by mouth daily.   atorvastatin (LIPITOR) 40 MG tablet Take 40 mg by mouth at bedtime.   budesonide-formoterol (SYMBICORT) 160-4.5 MCG/ACT inhaler Inhale 2 puffs into the lungs 2 (two) times daily. (Patient taking differently: Inhale 2 puffs into the lungs in the morning.)   carvedilol (COREG) 6.25 MG tablet Take 1 tablet (6.25 mg total) by mouth 2 (two) times daily.   furosemide (LASIX) 40 MG tablet Take 1 tablet (40 mg total) by mouth daily at 2 PM. (Patient taking differently: Take 40 mg by mouth in the morning.)   hydrALAZINE (APRESOLINE) 25 MG tablet Take 1 tablet (25 mg total) by mouth every 8 (eight) hours.   ipratropium-albuterol (DUONEB) 0.5-2.5 (3) MG/3ML SOLN Take 3 mLs by nebulization every 6 (six) hours as needed (wheezing/shortness of breath).   isosorbide mononitrate (IMDUR) 120 MG 24 hr tablet Take 120 mg by mouth in the morning.   nitroGLYCERIN (NITROSTAT) 0.4 MG SL tablet Place 1 tablet (0.4 mg total) under the tongue every 5 (five) minutes x 3 doses as needed for chest pain.   pantoprazole (PROTONIX) 40 MG tablet Take 1 tablet (40 mg total) by mouth daily.   polyethylene glycol (MIRALAX / GLYCOLAX) 17 g packet Take 17 g by mouth daily as needed for moderate constipation.   potassium chloride SA (KLOR-CON M) 20 MEQ tablet Take 1 tablet (20 mEq total) by mouth daily.   ranolazine (RANEXA) 500 MG 12 hr tablet Take 1 tablet (500 mg total) by mouth 2 (two) times daily.   sertraline (ZOLOFT) 25 MG tablet Take 25 mg by mouth in the morning.   STOOL SOFTENER 100 MG capsule Take 100 mg by mouth 2 (two) times daily.     Allergies:    Morphine and codeine, Penicillins, Percocet [oxycodone-acetaminophen], Ace inhibitors, Iodinated contrast media, Iodine, Etodolac, Meclizine hcl, Tramadol, and Vicodin [hydrocodone-acetaminophen]   Social History   Socioeconomic History   Marital status: Divorced    Spouse name: Not on file   Number of children: 2   Years of education: 13.5   Highest education level: Not on file  Occupational History   Occupation: unemployed  Tobacco Use   Smoking status: Never   Smokeless tobacco: Never  Vaping Use   Vaping status: Never Used  Substance and Sexual Activity   Alcohol use: Yes    Alcohol/week: 0.0 standard drinks of alcohol    Comment: rarely; "I don't drink.  But I like it."   Drug use: No   Sexual activity: Yes  Other Topics Concern   Not on file  Social History Narrative  Left handed   Drinks caffeine   One level home   Social Drivers of Health   Financial Resource Strain: Not on file  Food Insecurity: No Food Insecurity (09/03/2023)   Hunger Vital Sign    Worried About Running Out of Food in the Last Year: Never true    Ran Out of Food in the Last Year: Never true  Transportation Needs: No Transportation Needs (09/03/2023)   PRAPARE - Administrator, Civil Service (Medical): No    Lack of Transportation (Non-Medical): No  Physical Activity: Not on file  Stress: Not on file  Social Connections: Not on file     Family History: The patient's family history includes Diabetes in her father; Heart failure in her mother and sister; Hypertension in her daughter, mother, and sister.  ROS:   Review of Systems  Constitution: Negative for decreased appetite, fever and weight gain.  HENT: Negative for congestion, ear discharge, hoarse voice and sore throat.   Eyes: Negative for discharge, redness, vision loss in right eye and visual halos.  Cardiovascular: Negative for chest pain, dyspnea on exertion, leg swelling, orthopnea and palpitations.  Respiratory:  Negative for cough, hemoptysis, shortness of breath and snoring.   Endocrine: Negative for heat intolerance and polyphagia.  Hematologic/Lymphatic: Negative for bleeding problem. Does not bruise/bleed easily.  Skin: Negative for flushing, nail changes, rash and suspicious lesions.  Musculoskeletal: Negative for arthritis, joint pain, muscle cramps, myalgias, neck pain and stiffness.  Gastrointestinal: Negative for abdominal pain, bowel incontinence, diarrhea and excessive appetite.  Genitourinary: Negative for decreased libido, genital sores and incomplete emptying.  Neurological: Negative for brief paralysis, focal weakness, headaches and loss of balance.  Psychiatric/Behavioral: Negative for altered mental status, depression and suicidal ideas.  Allergic/Immunologic: Negative for HIV exposure and persistent infections.    EKGs/Labs/Other Studies Reviewed:    The following studies were reviewed today:   EKG:  The ekg ordered today demonstrates   Recent Labs: 08/28/2023: Magnesium 2.1 08/31/2023: ALT 11; TSH 4.530 09/01/2023: B Natriuretic Peptide 275.5 09/04/2023: Hemoglobin 11.2; Platelets 257 09/05/2023: BUN 24; Creatinine, Ser 1.47; Potassium 3.8; Sodium 136  Recent Lipid Panel    Component Value Date/Time   CHOL 159 09/02/2023 0308   CHOL 197 08/28/2023 1006   TRIG 97 09/02/2023 0308   HDL 31 (L) 09/02/2023 0308   HDL 34 (L) 08/28/2023 1006   CHOLHDL 5.1 09/02/2023 0308   VLDL 19 09/02/2023 0308   LDLCALC 109 (H) 09/02/2023 0308   LDLCALC 131 (H) 08/28/2023 1006    Physical Exam:    VS:  BP 135/79 (BP Location: Left Arm, Patient Position: Sitting, Cuff Size: Large)   Pulse 67   Ht 5\' 3"  (1.6 m)   Wt 227 lb 9.6 oz (103.2 kg)   SpO2 95%   BMI 40.32 kg/m     Wt Readings from Last 3 Encounters:  01/07/24 227 lb 9.6 oz (103.2 kg)  09/05/23 235 lb (106.6 kg)  08/28/23 235 lb 6.4 oz (106.8 kg)     GEN: Well nourished, well developed in no acute distress HEENT:  Normal NECK: No JVD; No carotid bruits LYMPHATICS: No lymphadenopathy CARDIAC: S1S2 noted,RRR, no murmurs, rubs, gallops RESPIRATORY:  Clear to auscultation without rales, wheezing or rhonchi  ABDOMEN: Soft, non-tender, non-distended, +bowel sounds, no guarding. EXTREMITIES: No edema, No cyanosis, no clubbing MUSCULOSKELETAL:  No deformity  SKIN: Warm and dry NEUROLOGIC:  Alert and oriented x 3, non-focal PSYCHIATRIC:  Normal affect, good insight  ASSESSMENT:  1. PAF (paroxysmal atrial fibrillation) (HCC)   2. Type 2 diabetes mellitus with hyperosmolarity without coma, without long-term current use of insulin (HCC)   3. Primary hypertension     PLAN:    Hypertension - blood pressure stable  Type 2 diabetes mellitus - Check blood glucose and HbA1c levels.  Paroxsymal atrial fibrillation - on amiodarone, Chadvasc score is 4- indication for anticoagulation. On eliquis. Continue  with current regime.  Chest Pain Intermittent chest pain likely musculoskeletal, reproducible on palpation, not cardiac-related. Previous catheterization showed no significant blockage. Ranexa introduced for coronary dilation. - Initiate Ranexa 500 mg twice daily. - Continue amlodipine, Eliquis, and amiodarone. - Recommend Tylenol. -   Follow-up Plan to see patient back in clinic in approximately 4 weeks to discuss results of testing and next steps in management.  The patient is in agreement with the above plan. The patient left the office in stable condition.  The patient will follow up in   Medication Adjustments/Labs and Tests Ordered: Current medicines are reviewed at length with the patient today.  Concerns regarding medicines are outlined above.  Orders Placed This Encounter  Procedures   LONG TERM MONITOR (3-14 DAYS)   Meds ordered this encounter  Medications   ranolazine (RANEXA) 500 MG 12 hr tablet    Sig: Take 1 tablet (500 mg total) by mouth 2 (two) times daily.    Dispense:  180  tablet    Refill:  3    Patient Instructions  Medication Instructions:  Your physician has recommended you make the following change in your medication:  START: Ranexa 500 mg twice daily  *If you need a refill on your cardiac medications before your next appointment, please call your pharmacy*  Testing/Procedures: ZIO XT- Long Term Monitor Instructions  Your physician has requested you wear a ZIO patch monitor for 14 days.  This is a single patch monitor. [employer] supplies one patch monitor per enrollment. Additional stickers are not available. Please do not apply patch if you will be having a Nuclear Stress Test,  Echocardiogram, Cardiac CT, MRI, or Chest Xray during the period you would be wearing the  monitor. The patch cannot be worn during these tests. You cannot remove and re-apply the  ZIO XT patch monitor.  Your ZIO patch monitor will be mailed 3 day USPS to your address on file. It may take 3-5 days  to receive your monitor after you have been enrolled.  Once you have received your monitor, please review the enclosed instructions. Your monitor  has already been registered assigning a specific monitor serial # to you.  Billing and Patient Assistance Program Information  We have supplied [employer] with any of your insurance information on file for billing purposes. [employer] offers a sliding scale Patient Assistance Program for patients that do not have  insurance, or whose insurance does not completely cover the cost of the ZIO monitor.  You must apply for the Patient Assistance Program to qualify for this discounted rate.  To apply, please call [employer] at [phone], select option 4, select option 2, ask to apply for  Patient Assistance Program. Meredeth Ide will ask your household income, and how many people  are in your household. They will quote your out-of-pocket cost based on that information.  [employer] will also be able to set up a 40-month, interest-free payment plan if  needed.  Applying the monitor   Shave hair from upper left chest.  Hold abrader disc by orange tab. Rub abrader in 40  strokes over the upper left chest as  indicated in your monitor instructions.  Clean area with 4 enclosed alcohol pads. Let dry.  Apply patch as indicated in monitor instructions. Patch will be placed under collarbone on left  side of chest with arrow pointing upward.  Rub patch adhesive wings for 2 minutes. Remove white label marked "1". Remove the white  label marked "2". Rub patch adhesive wings for 2 additional minutes.  While looking in a mirror, press and release button in center of patch. A small green light will  flash 3-4 times. This will be your only indicator that the monitor has been turned on.  Do not shower for the first 24 hours. You may shower after the first 24 hours.  Press the button if you feel a symptom. You will hear a small click. Record Date, Time and  Symptom in the Patient Logbook.  When you are ready to remove the patch, follow instructions on the last 2 pages of Patient  Logbook. Stick patch monitor onto the last page of Patient Logbook.  Place Patient Logbook in the blue and white box. Use locking tab on box and tape box closed  securely. The blue and white box has prepaid postage on it. Please place it in the mailbox as  soon as possible. Your physician should have your test results approximately 7 days after the  monitor has been mailed back to [facility].  Call [facility] Customer Care at [phone] if you have questions regarding  your ZIO XT patch monitor. Call them immediately if you see an orange light blinking on your  monitor.  If your monitor falls off in less than 4 days, contact our Monitor department at [phone].  If your monitor becomes loose or falls off after 4 days call [employer] at [phone] for  suggestions on securing your monitor    Follow-Up: At [facility], you and your health needs are  our priority.  As part of our continuing mission to provide you with exceptional heart care, we have created designated Provider Care Teams.  These Care Teams include your primary Cardiologist (physician) and Advanced Practice Providers (APPs -  Physician Assistants and Nurse Practitioners) who all work together to provide you with the care you need, when you need it.    Your next appointment:   16 week(s)  Provider:   Thomasene Ripple, DO      Adopting a Healthy Lifestyle.  Know what a healthy weight is for you (roughly BMI <25) and aim to maintain this   Aim for 7+ servings of fruits and vegetables daily   65-80+ fluid ounces of water or unsweet tea for healthy kidneys   Limit to max 1 drink of alcohol per day; avoid smoking/tobacco   Limit animal fats in diet for cholesterol and heart health - choose grass fed whenever available   Avoid highly processed foods, and foods high in saturated/trans fats   Aim for low stress - take time to unwind and care for your mental health   Aim for 150 min of moderate intensity exercise weekly for heart health, and weights twice weekly for bone health   Aim for 7-9 hours of sleep daily   When it comes to diets, agreement about the perfect plan isnt easy to find, even among the experts. Experts at the [facility] [employer] developed an idea known as the Healthy Eating Plate. Just imagine a plate divided into logical, healthy portions.   The emphasis  is on diet quality:   Load up on vegetables and fruits - one-half of your plate: Aim for color and variety, and remember that potatoes dont count.   Go for whole grains - one-quarter of your plate: Whole wheat, barley, wheat berries, quinoa, oats, brown rice, and foods made with them. If you want pasta, go with whole wheat pasta.   Protein power - one-quarter of your plate: Fish, chicken, beans, and nuts are all healthy, versatile protein sources. Limit red meat.   The diet, however, does  go beyond the plate, offering a few other suggestions.   Use healthy plant oils, such as olive, canola, soy, corn, sunflower and peanut. Check the labels, and avoid partially hydrogenated oil, which have unhealthy trans fats.   If youre thirsty, drink water. Coffee and tea are good in moderation, but skip sugary drinks and limit milk and dairy products to one or two daily servings.   The type of carbohydrate in the diet is more important than the amount. Some sources of carbohydrates, such as vegetables, fruits, whole grains, and beans-are healthier than others.   Finally, stay active  Osvaldo Shipper, DO  01/10/2024 11:16 PM    [facility]

## 2024-01-07 NOTE — Patient Instructions (Signed)
 Medication Instructions:  Your physician has recommended you make the following change in your medication:  START: Ranexa 500 mg twice daily  *If you need a refill on your cardiac medications before your next appointment, please call your pharmacy*  Testing/Procedures: ZIO XT- Long Term Monitor Instructions  Your physician has requested you wear a ZIO patch monitor for 14 days.  This is a single patch monitor. [employer] supplies one patch monitor per enrollment. Additional stickers are not available. Please do not apply patch if you will be having a Nuclear Stress Test,  Echocardiogram, Cardiac CT, MRI, or Chest Xray during the period you would be wearing the  monitor. The patch cannot be worn during these tests. You cannot remove and re-apply the  ZIO XT patch monitor.  Your ZIO patch monitor will be mailed 3 day USPS to your address on file. It may take 3-5 days  to receive your monitor after you have been enrolled.  Once you have received your monitor, please review the enclosed instructions. Your monitor  has already been registered assigning a specific monitor serial # to you.  Billing and Patient Assistance Program Information  We have supplied [employer] with any of your insurance information on file for billing purposes. [employer] offers a sliding scale Patient Assistance Program for patients that do not have  insurance, or whose insurance does not completely cover the cost of the ZIO monitor.  You must apply for the Patient Assistance Program to qualify for this discounted rate.  To apply, please call [employer] at [phone], select option 4, select option 2, ask to apply for  Patient Assistance Program. Meredeth Ide will ask your household income, and how many people  are in your household. They will quote your out-of-pocket cost based on that information.  [employer] will also be able to set up a 86-month, interest-free payment plan if needed.  Applying the monitor   Shave hair from upper  left chest.  Hold abrader disc by orange tab. Rub abrader in 40 strokes over the upper left chest as  indicated in your monitor instructions.  Clean area with 4 enclosed alcohol pads. Let dry.  Apply patch as indicated in monitor instructions. Patch will be placed under collarbone on left  side of chest with arrow pointing upward.  Rub patch adhesive wings for 2 minutes. Remove white label marked "1". Remove the white  label marked "2". Rub patch adhesive wings for 2 additional minutes.  While looking in a mirror, press and release button in center of patch. A small green light will  flash 3-4 times. This will be your only indicator that the monitor has been turned on.  Do not shower for the first 24 hours. You may shower after the first 24 hours.  Press the button if you feel a symptom. You will hear a small click. Record Date, Time and  Symptom in the Patient Logbook.  When you are ready to remove the patch, follow instructions on the last 2 pages of Patient  Logbook. Stick patch monitor onto the last page of Patient Logbook.  Place Patient Logbook in the blue and white box. Use locking tab on box and tape box closed  securely. The blue and white box has prepaid postage on it. Please place it in the mailbox as  soon as possible. Your physician should have your test results approximately 7 days after the  monitor has been mailed back to [facility].  Call [facility] Customer Care at [phone] if you have  questions regarding  your ZIO XT patch monitor. Call them immediately if you see an orange light blinking on your  monitor.  If your monitor falls off in less than 4 days, contact our Monitor department at [phone].  If your monitor becomes loose or falls off after 4 days call [employer] at [phone] for  suggestions on securing your monitor    Follow-Up: At [facility], you and your health needs are our priority.  As part of our continuing mission to provide  you with exceptional heart care, we have created designated Provider Care Teams.  These Care Teams include your primary Cardiologist (physician) and Advanced Practice Providers (APPs -  Physician Assistants and Nurse Practitioners) who all work together to provide you with the care you need, when you need it.    Your next appointment:   16 week(s)  Provider:   Thomasene Ripple, DO

## 2024-01-07 NOTE — Progress Notes (Unsigned)
 Enrolled patient for a 14 day Zio XT  monitor to be mailed to patients home

## 2024-03-12 ENCOUNTER — Emergency Department (HOSPITAL_COMMUNITY)

## 2024-03-12 ENCOUNTER — Other Ambulatory Visit: Payer: Self-pay

## 2024-03-12 ENCOUNTER — Observation Stay (HOSPITAL_COMMUNITY)
Admission: EM | Admit: 2024-03-12 | Discharge: 2024-03-13 | Disposition: A | Discharge status: Home / Self Care | Attending: Internal Medicine | Admitting: Internal Medicine

## 2024-03-12 ENCOUNTER — Encounter (HOSPITAL_COMMUNITY): Payer: Self-pay

## 2024-03-12 DIAGNOSIS — Z8673 Personal history of transient ischemic attack (TIA), and cerebral infarction without residual deficits: Secondary | ICD-10-CM | POA: Insufficient documentation

## 2024-03-12 DIAGNOSIS — N1832 Chronic kidney disease, stage 3b: Secondary | ICD-10-CM | POA: Diagnosis not present

## 2024-03-12 DIAGNOSIS — Z6841 Body Mass Index (BMI) 40.0 and over, adult: Secondary | ICD-10-CM | POA: Diagnosis not present

## 2024-03-12 DIAGNOSIS — I480 Paroxysmal atrial fibrillation: Secondary | ICD-10-CM | POA: Diagnosis not present

## 2024-03-12 DIAGNOSIS — E1165 Type 2 diabetes mellitus with hyperglycemia: Secondary | ICD-10-CM | POA: Diagnosis not present

## 2024-03-12 DIAGNOSIS — Z955 Presence of coronary angioplasty implant and graft: Secondary | ICD-10-CM | POA: Insufficient documentation

## 2024-03-12 DIAGNOSIS — E785 Hyperlipidemia, unspecified: Secondary | ICD-10-CM | POA: Insufficient documentation

## 2024-03-12 DIAGNOSIS — N179 Acute kidney failure, unspecified: Secondary | ICD-10-CM | POA: Diagnosis not present

## 2024-03-12 DIAGNOSIS — R079 Chest pain, unspecified: Secondary | ICD-10-CM | POA: Diagnosis present

## 2024-03-12 DIAGNOSIS — I5032 Chronic diastolic (congestive) heart failure: Secondary | ICD-10-CM | POA: Insufficient documentation

## 2024-03-12 DIAGNOSIS — I25110 Atherosclerotic heart disease of native coronary artery with unstable angina pectoris: Secondary | ICD-10-CM | POA: Diagnosis not present

## 2024-03-12 DIAGNOSIS — Z79899 Other long term (current) drug therapy: Secondary | ICD-10-CM | POA: Insufficient documentation

## 2024-03-12 DIAGNOSIS — R531 Weakness: Secondary | ICD-10-CM | POA: Insufficient documentation

## 2024-03-12 DIAGNOSIS — Z96641 Presence of right artificial hip joint: Secondary | ICD-10-CM | POA: Insufficient documentation

## 2024-03-12 DIAGNOSIS — Z7982 Long term (current) use of aspirin: Secondary | ICD-10-CM | POA: Insufficient documentation

## 2024-03-12 DIAGNOSIS — Z7901 Long term (current) use of anticoagulants: Secondary | ICD-10-CM | POA: Diagnosis not present

## 2024-03-12 DIAGNOSIS — G4489 Other headache syndrome: Secondary | ICD-10-CM

## 2024-03-12 DIAGNOSIS — I200 Unstable angina: Secondary | ICD-10-CM

## 2024-03-12 DIAGNOSIS — R0789 Other chest pain: Principal | ICD-10-CM | POA: Insufficient documentation

## 2024-03-12 DIAGNOSIS — E669 Obesity, unspecified: Secondary | ICD-10-CM | POA: Diagnosis not present

## 2024-03-12 DIAGNOSIS — E1122 Type 2 diabetes mellitus with diabetic chronic kidney disease: Secondary | ICD-10-CM | POA: Diagnosis not present

## 2024-03-12 DIAGNOSIS — R519 Headache, unspecified: Secondary | ICD-10-CM | POA: Insufficient documentation

## 2024-03-12 LAB — CBC
HCT: 35.1 % — ABNORMAL LOW (ref 36.0–46.0)
Hemoglobin: 11.5 g/dL — ABNORMAL LOW (ref 12.0–15.0)
MCH: 28.8 pg (ref 26.0–34.0)
MCHC: 32.8 g/dL (ref 30.0–36.0)
MCV: 87.8 fL (ref 80.0–100.0)
Platelets: 285 10*3/uL (ref 150–400)
RBC: 4 MIL/uL (ref 3.87–5.11)
RDW: 15.9 % — ABNORMAL HIGH (ref 11.5–15.5)
WBC: 6.4 10*3/uL (ref 4.0–10.5)
nRBC: 0 % (ref 0.0–0.2)

## 2024-03-12 LAB — BASIC METABOLIC PANEL WITH GFR
Anion gap: 12 (ref 5–15)
BUN: 30 mg/dL — ABNORMAL HIGH (ref 8–23)
CO2: 22 mmol/L (ref 22–32)
Calcium: 8.7 mg/dL — ABNORMAL LOW (ref 8.9–10.3)
Chloride: 105 mmol/L (ref 98–111)
Creatinine, Ser: 1.92 mg/dL — ABNORMAL HIGH (ref 0.44–1.00)
GFR, Estimated: 27 mL/min — ABNORMAL LOW (ref >60)
Glucose, Bld: 115 mg/dL — ABNORMAL HIGH (ref 70–99)
Potassium: 3.9 mmol/L (ref 3.5–5.1)
Sodium: 139 mmol/L (ref 135–145)

## 2024-03-12 LAB — TROPONIN I (HIGH SENSITIVITY)
Troponin I (High Sensitivity): 16 ng/L (ref <18)
Troponin I (High Sensitivity): 16 ng/L (ref <18)

## 2024-03-12 NOTE — ED Triage Notes (Signed)
 Pt BIB [facility] from home for chest pain since last night. Pt states this started as pain but now reports pressure. Pt also endorses HA.  132/50 66HR 115cbg 324 mg ASA

## 2024-03-12 NOTE — ED Provider Triage Note (Signed)
 Emergency Medicine Provider Triage Evaluation Note  Sherry Fitzpatrick , a 73 y.o. female  was evaluated in triage.  Pt complains of chest pain that started last night.  Additionally endorses a headache.  Has a history of MI.  Review of Systems  Positive:  Negative:   Physical Exam  BP (!) 154/78 (BP Location: Right Arm)   Pulse 61   Temp 98.5 F (36.9 C)   Resp 16   Ht 5\' 3"  (1.6 m)   Wt 103 kg   SpO2 97%   BMI 40.22 kg/m  Gen:   Awake, no distress   Resp:  Normal effort  MSK:   Moves extremities without difficulty  Other:    Medical Decision Making  Medically screening exam initiated at 7:54 PM.  Appropriate orders placed.  Thomasina Fletcher was informed that the remainder of the evaluation will be completed by another provider, this initial triage assessment does not replace that evaluation, and the importance of remaining in the ED until their evaluation is complete.     Felicie Horning, PA-C 03/12/24 1955

## 2024-03-13 ENCOUNTER — Observation Stay (HOSPITAL_COMMUNITY)

## 2024-03-13 ENCOUNTER — Emergency Department (HOSPITAL_COMMUNITY)

## 2024-03-13 DIAGNOSIS — R079 Chest pain, unspecified: Secondary | ICD-10-CM | POA: Diagnosis not present

## 2024-03-13 LAB — LIPID PANEL
Cholesterol: 220 mg/dL — ABNORMAL HIGH (ref 0–200)
HDL: 30 mg/dL — ABNORMAL LOW (ref >40)
LDL Cholesterol: 159 mg/dL — ABNORMAL HIGH (ref 0–99)
Total CHOL/HDL Ratio: 7.3 ratio
Triglycerides: 154 mg/dL — ABNORMAL HIGH (ref <150)
VLDL: 31 mg/dL (ref 0–40)

## 2024-03-13 LAB — BASIC METABOLIC PANEL WITH GFR
Anion gap: 8 (ref 5–15)
BUN: 27 mg/dL — ABNORMAL HIGH (ref 8–23)
CO2: 23 mmol/L (ref 22–32)
Calcium: 7.9 mg/dL — ABNORMAL LOW (ref 8.9–10.3)
Chloride: 109 mmol/L (ref 98–111)
Creatinine, Ser: 1.72 mg/dL — ABNORMAL HIGH (ref 0.44–1.00)
GFR, Estimated: 31 mL/min — ABNORMAL LOW (ref >60)
Glucose, Bld: 114 mg/dL — ABNORMAL HIGH (ref 70–99)
Potassium: 3 mmol/L — ABNORMAL LOW (ref 3.5–5.1)
Sodium: 140 mmol/L (ref 135–145)

## 2024-03-13 LAB — CBC
HCT: 28.9 % — ABNORMAL LOW (ref 36.0–46.0)
Hemoglobin: 9.5 g/dL — ABNORMAL LOW (ref 12.0–15.0)
MCH: 29.1 pg (ref 26.0–34.0)
MCHC: 32.9 g/dL (ref 30.0–36.0)
MCV: 88.4 fL (ref 80.0–100.0)
Platelets: 232 10*3/uL (ref 150–400)
RBC: 3.27 MIL/uL — ABNORMAL LOW (ref 3.87–5.11)
RDW: 16 % — ABNORMAL HIGH (ref 11.5–15.5)
WBC: 6.2 10*3/uL (ref 4.0–10.5)
nRBC: 0 % (ref 0.0–0.2)

## 2024-03-13 LAB — MAGNESIUM: Magnesium: 1.8 mg/dL (ref 1.7–2.4)

## 2024-03-13 LAB — ECHOCARDIOGRAM COMPLETE
AR max vel: 1.19 cm2
AV Area VTI: 1.17 cm2
AV Area mean vel: 1.14 cm2
AV Mean grad: 18 mmHg
AV Peak grad: 32.3 mmHg
Ao pk vel: 2.84 m/s
Area-P 1/2: 2.95 cm2
Height: 63 in
MV M vel: 3.48 m/s
MV Peak grad: 48.4 mmHg
S' Lateral: 2.8 cm
Weight: 3633.18 [oz_av]

## 2024-03-13 LAB — CBG MONITORING, ED
Glucose-Capillary: 130 mg/dL — ABNORMAL HIGH (ref 70–99)
Glucose-Capillary: 133 mg/dL — ABNORMAL HIGH (ref 70–99)

## 2024-03-13 LAB — SEDIMENTATION RATE: Sed Rate: 28 mm/h — ABNORMAL HIGH (ref 0–22)

## 2024-03-13 LAB — HEMOGLOBIN A1C
Hgb A1c MFr Bld: 6 % — ABNORMAL HIGH (ref 4.8–5.6)
Mean Plasma Glucose: 125.5 mg/dL

## 2024-03-13 LAB — PHOSPHORUS: Phosphorus: 3.9 mg/dL (ref 2.5–4.6)

## 2024-03-13 MED ORDER — PROCHLORPERAZINE EDISYLATE 10 MG/2ML IJ SOLN: 5.0000 mg | Freq: Four times a day (QID) | INTRAMUSCULAR | Status: DC | PRN

## 2024-03-13 MED ORDER — INSULIN ASPART 100 UNIT/ML IJ SOLN: 0.0000 [IU] | Freq: Three times a day (TID) | INTRAMUSCULAR | Status: DC

## 2024-03-13 MED ORDER — APIXABAN 5 MG PO TABS
5.0000 mg | ORAL_TABLET | Freq: Two times a day (BID) | ORAL | Status: DC
  Administered 2024-03-13: 5 mg via ORAL
  Filled 2024-03-13: qty 1

## 2024-03-13 MED ORDER — ACETAMINOPHEN 325 MG PO TABS: 650.0000 mg | ORAL_TABLET | Freq: Four times a day (QID) | ORAL | Status: DC | PRN

## 2024-03-13 MED ORDER — MELATONIN 5 MG PO TABS: 5.0000 mg | ORAL_TABLET | Freq: Every evening | ORAL | Status: DC | PRN

## 2024-03-13 MED ORDER — ATORVASTATIN CALCIUM 40 MG PO TABS: 40.0000 mg | ORAL_TABLET | Freq: Every day | ORAL | Status: DC

## 2024-03-13 MED ORDER — POTASSIUM CHLORIDE CRYS ER 20 MEQ PO TBCR
40.0000 meq | EXTENDED_RELEASE_TABLET | Freq: Once | ORAL | Status: AC
  Administered 2024-03-13: 40 meq via ORAL
  Filled 2024-03-13: qty 2

## 2024-03-13 MED ORDER — FLUTICASONE FUROATE-VILANTEROL 200-25 MCG/ACT IN AEPB
1.0000 | INHALATION_SPRAY | Freq: Every day | RESPIRATORY_TRACT | Status: DC
  Administered 2024-03-13: 1 via RESPIRATORY_TRACT
  Filled 2024-03-13: qty 28

## 2024-03-13 MED ORDER — INSULIN ASPART 100 UNIT/ML IJ SOLN: 0.0000 [IU] | Freq: Every day | INTRAMUSCULAR | Status: DC

## 2024-03-13 MED ORDER — NITROGLYCERIN 0.4 MG SL SUBL
0.4000 mg | SUBLINGUAL_TABLET | SUBLINGUAL | Status: DC | PRN
  Administered 2024-03-13: 0.4 mg via SUBLINGUAL
  Filled 2024-03-13: qty 1

## 2024-03-13 MED ORDER — ASPIRIN 81 MG PO CHEW: 81.0000 mg | CHEWABLE_TABLET | Freq: Every day | ORAL | Status: DC

## 2024-03-13 MED ORDER — APIXABAN 5 MG PO TABS: 5.0000 mg | ORAL_TABLET | Freq: Two times a day (BID) | ORAL | Status: DC

## 2024-03-13 MED ORDER — LACTATED RINGERS IV SOLN: INTRAVENOUS | Status: AC

## 2024-03-13 MED ORDER — POLYETHYLENE GLYCOL 3350 17 G PO PACK: 17.0000 g | PACK | Freq: Every day | ORAL | Status: DC | PRN

## 2024-03-13 MED ORDER — ASPIRIN 81 MG PO CHEW
81.0000 mg | CHEWABLE_TABLET | Freq: Once | ORAL | Status: AC
  Administered 2024-03-13: 81 mg via ORAL
  Filled 2024-03-13: qty 1

## 2024-03-13 NOTE — Progress Notes (Signed)
 Same day note  Sherry Fitzpatrick is a 73 y.o. female with medical history significant for coronary artery disease status post PCI with stent placement, moderate aortic stenosis, chronic HFpEF, restrictive cardiomyopathy, history of CVA and TIAs, paroxysmal A-fib on Eliquis , hypertension, hyperlipidemia, type 2 diabetes, OSA, CKD 3B, presented to hospital with sudden onset of left-sided chest pain that woke her up from sleep.  Feels like pressure.  In the ED patient was bradycardic and tachypneic.  Troponins were negative.  Creatinine was elevated at 1.9 above baseline of 1.4.  Troponins were flat.  Patient was then placed in observation in the hospital.    Patient seen and examined at bedside.  Patient was admitted to the hospital for chest pain.  At the time of my evaluation, patient complains of chest pain Physical examination reveals reproducible chest pain  Laboratory data and imaging was reviewed  Assessment and Plan.  Atypical chest pain likely musculoskeletal Patient does have reproducible tenderness on palpation.  Troponins negative.  Seen by cardiology in no further workup is recommended.  2D echocardiogram pending.   AKI on CKD 3B Baseline creatinine 1.4 with GFR 37.  Creatinine on presentation at 1.9.  Has improved to 1.7.  Will need to continue to monitor as outpatient. Received gentle hydration during hospitalization.  Chronic HFpEF Compensated at this time.   Type 2 diabetes with hyperglycemia Last hemoglobin A1c 7.0 on 09/02/2023.  Continue diabetic diet.   Paroxysmal A-fib on Eliquis  Rate controlled.  Continue Eliquis .   History of CVA and TIAs Resume home Eliquis  and Lipitor    Hyperlipidemia Continue Lipitor .   Severe obesity BMI 40, Continue weight loss efforts..   Generalized weakness Patient feels at baseline.  No Charge  Signed,  Lindwood Rhody, MD [facility]

## 2024-03-13 NOTE — ED Provider Notes (Signed)
 [place] EMERGENCY DEPARTMENT AT [facility] Provider Note   CSN: [id number] Arrival date & time: 03/12/24  1847     History  Chief Complaint  Patient presents with   Chest Pain    Sherry Fitzpatrick is a 73 y.o. female.  The history is provided by the patient.  Patient with extensive history including CAD, CHF, aortic stenosis, PAF on anticoagulation presents for multiple complaints Patient reports she has had chest pain with been persistent for the last 2 or 3 days.  She reports this chest pressure with associated shortness of breath.  The pain radiates to her left shoulder.  Reports she is having increasing dyspnea on exertion.  She is concerned about her heart as she has a previous history of CAD/MI  Patient also reports she has had headache for the past several days.  It is in the frontal region.  No fevers or vomiting.  No new vision changes.  No focal weakness.  No falls or trauma  Patient also reports pain in her left leg mostly around the left hip, denies any trauma.    She has already had aspirin  Home Medications Prior to Admission medications   Medication Sig Start Date End Date Taking? Authorizing Provider  acetaminophen  (TYLENOL ) 325 MG tablet Take 2 tablets (650 mg total) by mouth every 6 (six) hours as needed (Mild pain). Patient taking differently: Take 650 mg by mouth as needed for mild pain (pain score 1-3) or moderate pain (pain score 4-6). 06/09/20   Angiulli, Everlyn Hockey, PA-C  albuterol  (VENTOLIN  HFA) 108 (90 Base) MCG/ACT inhaler Inhale 2 puffs into the lungs every 6 (six) hours as needed for wheezing. 12/26/22   Pokhrel, Laxman, MD  amiodarone  (PACERONE ) 200 MG tablet Take 1 tablet (200 mg total) by mouth daily. 09/06/23   Ozell Blunt, MD  amLODipine  (NORVASC ) 10 MG tablet Take 1 tablet (10 mg total) by mouth daily. 09/06/23   Ozell Blunt, MD  apixaban  (ELIQUIS ) 5 MG TABS tablet Take 1 tablet (5 mg total) by mouth 2 (two) times daily. 09/05/23    Ozell Blunt, MD  aspirin  81 MG chewable tablet Chew 1 tablet (81 mg total) by mouth daily. 09/06/23   Ozell Blunt, MD  atorvastatin  (LIPITOR ) 40 MG tablet Take 40 mg by mouth at bedtime. 08/01/23   [provider]  budesonide -formoterol  (SYMBICORT ) 160-4.5 MCG/ACT inhaler Inhale 2 puffs into the lungs 2 (two) times daily. Patient taking differently: Inhale 2 puffs into the lungs in the morning. 07/09/23   Audria Leather, MD  carvedilol  (COREG ) 6.25 MG tablet Take 1 tablet (6.25 mg total) by mouth 2 (two) times daily. 12/26/22 06/30/24  Pokhrel, Amador Bad, MD  ferrous sulfate  325 (65 FE) MG EC tablet Take 325 mg by mouth in the morning. Patient not taking: Reported on 01/07/2024    [provider]  fluticasone  (FLONASE ) 50 MCG/ACT nasal spray Place 2 sprays into both nostrils daily. Patient not taking: Reported on 01/07/2024 09/06/23   Ozell Blunt, MD  furosemide  (LASIX ) 40 MG tablet Take 1 tablet (40 mg total) by mouth daily at 2 PM. Patient taking differently: Take 40 mg by mouth in the morning. 12/26/22 06/30/24  Pokhrel, Laxman, MD  hydrALAZINE  (APRESOLINE ) 25 MG tablet Take 1 tablet (25 mg total) by mouth every 8 (eight) hours. 12/26/22 06/30/24  Pokhrel, Amador Bad, MD  ipratropium-albuterol  (DUONEB) 0.5-2.5 (3) MG/3ML SOLN Take 3 mLs by nebulization every 6 (six) hours as needed (wheezing/shortness of breath).  07/09/23   Audria Leather, MD  isosorbide  mononitrate (IMDUR ) 120 MG 24 hr tablet Take 120 mg by mouth in the morning.    [provider]  meclizine (ANTIVERT) 25 MG tablet Take 25 mg by mouth 2 (two) times daily as needed (vertigo). Patient not taking: Reported on 01/07/2024 08/27/23   [provider]  nitroGLYCERIN  (NITROSTAT ) 0.4 MG SL tablet Place 1 tablet (0.4 mg total) under the tongue every 5 (five) minutes x 3 doses as needed for chest pain. 05/25/20   Chapman Commodore, MD  ondansetron  (ZOFRAN ) 8 MG tablet Take 8 mg by mouth 2 (two) times daily as needed for  nausea or vomiting (vertigo). Patient not taking: Reported on 01/07/2024 08/27/23   [provider]  pantoprazole  (PROTONIX ) 40 MG tablet Take 1 tablet (40 mg total) by mouth daily. 07/09/23   Audria Leather, MD  polyethylene glycol (MIRALAX  / GLYCOLAX ) 17 g packet Take 17 g by mouth daily as needed for moderate constipation. 07/09/23   Audria Leather, MD  potassium chloride  SA (KLOR-CON  M) 20 MEQ tablet Take 1 tablet (20 mEq total) by mouth daily. 12/26/22   Pokhrel, Laxman, MD  ranolazine  (RANEXA ) 500 MG 12 hr tablet Take 1 tablet (500 mg total) by mouth 2 (two) times daily. 01/07/24   Tobb, Kardie, DO  sertraline  (ZOLOFT ) 25 MG tablet Take 25 mg by mouth in the morning. 08/01/23   [provider]  STOOL SOFTENER 100 MG capsule Take 100 mg by mouth 2 (two) times daily. 06/16/23   [provider]  potassium chloride  (KLOR-CON ) 10 MEQ tablet Take 2 tablets (20 mEq total) by mouth daily. Patient not taking: Reported on 12/16/2022 06/09/20 12/26/22  Angiulli, Everlyn Hockey, PA-C      Allergies    Morphine  and codeine, Penicillins, Percocet [oxycodone -acetaminophen ], Ace inhibitors, Iodinated contrast media, Iodine, Etodolac, Meclizine hcl, Tramadol , and Vicodin [hydrocodone-acetaminophen ]    Review of Systems   Review of Systems  Constitutional:  Negative for fever.  Eyes:  Negative for visual disturbance.  Cardiovascular:  Positive for chest pain.  Musculoskeletal:  Positive for arthralgias.  Neurological:  Positive for headaches.    Physical Exam Updated Vital Signs BP (!) 168/85   Pulse (!) 54   Temp 98.3 F (36.8 C)   Resp 15   Ht 1.6 m (5\' 3" )   Wt 103 kg   SpO2 100%   BMI 40.22 kg/m  Physical Exam CONSTITUTIONAL: Elderly and anxious HEAD: Normocephalic/atraumatic, no visible trauma, no temporal tenderness EYES: EOMI/PERRL, no corneal haziness ENMT: Mucous membranes moist NECK: supple no meningeal signs SPINE/BACK:entire spine nontender CV: S1/S2 noted LUNGS:  Lungs are clear to auscultation bilaterally, no apparent distress ABDOMEN: soft, nontender NEURO: Pt is awake/alert/appropriate, moves all extremitiesx4.  No facial droop.   EXTREMITIES: pulses normal/equalx4, full ROM Tenderness to range of motion of left hip No lower extremity edema or calf tenderness All other extremities/joints palpated/ranged and nontender SKIN: warm, color normal PSYCH: Anxious ED Results / Procedures / Treatments   Labs (all labs ordered are listed, but only abnormal results are displayed) Labs Reviewed  BASIC METABOLIC PANEL WITH GFR - Abnormal; Notable for the following components:      Result Value   Glucose, Bld 115 (*)    BUN 30 (*)    Creatinine, Ser 1.92 (*)    Calcium  8.7 (*)    GFR, Estimated 27 (*)    All other components within normal limits  CBC - Abnormal; Notable for the following components:  Hemoglobin 11.5 (*)    HCT 35.1 (*)    RDW 15.9 (*)    All other components within normal limits  SEDIMENTATION RATE  TROPONIN I (HIGH SENSITIVITY)  TROPONIN I (HIGH SENSITIVITY)    EKG EKG Interpretation Date/Time:  Thursday Mar 12 2024 18:53:41 EDT Ventricular Rate:  67 PR Interval:  200 QRS Duration:  86 QT Interval:  434 QTC Calculation: 458 R Axis:   29  Text Interpretation: Sinus rhythm with Premature atrial complexes Possible Anterior infarct , age undetermined T wave abnormality Abnormal ECG Confirmed by Eldon Greenland ([id number]) on 03/13/2024 12:33:16 AM  Radiology CT Head Wo Contrast Result Date: 03/13/2024 CLINICAL DATA:  New onset headache EXAM: CT HEAD WITHOUT CONTRAST TECHNIQUE: Contiguous axial images were obtained from the base of the skull through the vertex without intravenous contrast. RADIATION DOSE REDUCTION: This exam was performed according to the departmental dose-optimization program which includes automated exposure control, adjustment of the mA and/or kV according to patient size and/or use of iterative reconstruction  technique. COMPARISON:  MRI head and CT head 08/31/2023 FINDINGS: Brain: No intracranial hemorrhage, mass effect, or evidence of acute infarct. No hydrocephalus. No extra-axial fluid collection. Age-commensurate cerebral atrophy and chronic small vessel ischemic disease. Vascular: No hyperdense vessel. Intracranial arterial calcification. Skull: No fracture or focal lesion. Sinuses/Orbits: No acute finding. Other: None. IMPRESSION: No acute intracranial abnormality. Electronically Signed   By: Rozell Cornet M.D.   On: 03/13/2024 01:48   DG HIP UNILAT WITH PELVIS 2-3 VIEWS LEFT Result Date: 03/13/2024 CLINICAL DATA:  Pain EXAM: DG HIP (WITH OR WITHOUT PELVIS) 2-3V LEFT COMPARISON:  None Available. FINDINGS: Prior right hip replacement. Moderate degenerative changes in the left hip with joint space loss and spurring. No acute bony abnormality. Specifically, no fracture, subluxation, or dislocation. SI joints symmetric. IMPRESSION: No acute bony abnormality. Electronically Signed   By: Janeece Mechanic M.D.   On: 03/13/2024 01:20   DG Chest 2 View Result Date: 03/12/2024 CLINICAL DATA:  chest pain EXAM: CHEST - 2 VIEW COMPARISON:  August 31, 2023 FINDINGS: Lower lung volumes. No focal airspace consolidation, pleural effusion, or pneumothorax. Unchanged moderate cardiomegaly. Aortic atherosclerosis. No acute fracture or destructive lesion. Multilevel thoracic osteophytosis. IMPRESSION: Unchanged cardiomegaly with central vascular pulmonary congestion. No pneumonia or pulmonary edema. Electronically Signed   By: Rance Burrows M.D.   On: 03/12/2024 21:12    Procedures Procedures    Medications Ordered in ED Medications  nitroGLYCERIN  (NITROSTAT ) SL tablet 0.4 mg (0.4 mg Sublingual Given 03/13/24 0204)    ED Course/ Medical Decision Making/ A&P Clinical Course as of 03/13/24 0226  Fri Mar 13, 2024  0054 Creatinine(!): 1.92 Renal insufficiency [DW]  0100 Patient presents for multiple complaints  including chest pain with known history of CAD and previous MI.  Initial troponin negative, no EKG changes, patient will need to be admitted  Also reports headache in the setting of taking anticoagulation.  Will obtain CT head and check sed rate due to her age  She also mentions left hip pain but no recent falls or trauma.  Will obtain x-ray [DW]  0225 Patient stable.  Discussed with Dr. Del Favia for admission CT head is negative [DW]    Clinical Course User Index [DW] Eldon Greenland, MD             HEART Score: 5                    Medical Decision Making Amount and/or Complexity  of Data Reviewed Labs: ordered. Decision-making details documented in ED Course. Radiology: ordered.  Risk Prescription drug management. Decision regarding hospitalization.   This patient presents to the ED for concern of chest pain, this involves an extensive number of treatment options, and is a complaint that carries with it a high risk of complications and morbidity.  The differential diagnosis includes but is not limited to acute coronary syndrome, aortic dissection, pulmonary embolism, pericarditis, pneumothorax, pneumonia, myocarditis, pleurisy, esophageal rupture    Comorbidities that complicate the patient evaluation: Patient's presentation is complicated by their history of CAD  Social Determinants of Health: Patient's anxiety  increases the complexity of managing their presentation  Additional history obtained: Records reviewed previous admission documents  Lab Tests: I Ordered, and personally interpreted labs.  The pertinent results include: Renal insufficiency  Imaging Studies ordered: I ordered imaging studies including X-ray chest  I independently visualized and interpreted imaging which showed no acute findings I agree with the radiologist interpretation  Cardiac Monitoring: The patient was maintained on a cardiac monitor.  I personally viewed and interpreted the cardiac monitor  which showed an underlying rhythm of:  sinus rhythm  Medicines ordered and prescription drug management: I ordered medication including nitroglycerin  for chest pain Reevaluation of the patient after these medicines showed that the patient    improved  Test Considered: Heart score 5, will recommend admission  Critical Interventions:   admission for chest pain evaluation  Consultations Obtained: I requested consultation with the admitting physician [facility] , and discussed  findings as well as pertinent plan - they recommend: Admit  Reevaluation: After the interventions noted above, I reevaluated the patient and found that they have :improved  Complexity of problems addressed: Patient's presentation is most consistent with  acute presentation with potential threat to life or bodily function  Disposition: After consideration of the diagnostic results and the patient's response to treatment,  I feel that the patent would benefit from admission  .           Final Clinical Impression(s) / ED Diagnoses Final diagnoses:  Unstable angina (HCC)  Other headache syndrome    Rx / DC Orders ED Discharge Orders     None         Eldon Greenland, MD 03/13/24 [phone]

## 2024-03-13 NOTE — Progress Notes (Signed)
 Physical Therapy Evaluation Patient Details Name: Sherry Fitzpatrick MRN: [id number] DOB: Mar 13, 1951 Today's Date: 03/13/2024  History of Present Illness  73 y.o. female with medical history significant for coronary artery disease status post PCI with stent placement, moderate aortic stenosis, chronic HFpEF, restrictive cardiomyopathy, history of CVA and TIAs, paroxysmal A-fib on Eliquis , hypertension, hyperlipidemia, type 2 diabetes, OSA, CKD 3B, who presented 03/12/24 ER due to sudden onset left-sided chest pain with SOB with mobility.  Clinical Impression  Pt admitted with above diagnosis. Pt was able to progress ambulation with RW and demonstrates good safety with RW.  Pt has family support as well per pt. Recommend HHPT to progress pt to no device as she desires to quit using RW if she can.  Pt feels she can go home today.  Will follow acutely.  Pt currently with functional limitations due to the deficits listed below (see PT Problem List). Pt will benefit from acute skilled PT to increase their independence and safety with mobility to allow discharge.           If plan is discharge home, recommend the following: A little help with walking and/or transfers;A little help with bathing/dressing/bathroom;Assist for transportation;Help with stairs or ramp for entrance;Assistance with cooking/housework   Can travel by private vehicle        Equipment Recommendations BSC/3in1  Recommendations for Other Services       Functional Status Assessment Patient has had a recent decline in their functional status and demonstrates the ability to make significant improvements in function in a reasonable and predictable amount of time.     Precautions / Restrictions Precautions Precautions: Fall Precaution/Restrictions Comments: Watch BP Restrictions Weight Bearing Restrictions Per Provider Order: No      Mobility  Bed Mobility Overal bed mobility: Needs Assistance Bed Mobility: Supine to Sit, Sit  to Supine     Supine to sit: Supervision Sit to supine: Supervision        Transfers Overall transfer level: Needs assistance   Transfers: Sit to/from Stand Sit to Stand: Supervision                Ambulation/Gait Ambulation/Gait assistance: Supervision Gait Distance (Feet): 400 Feet Assistive device: Rolling walker (2 wheels) Gait Pattern/deviations: Step-through pattern, Decreased stride length   Gait velocity interpretation: <1.31 ft/sec, indicative of household ambulator   General Gait Details: Pt safe with gait with RW. States she has a RW and will use it on d/c for safety.  Stairs            Wheelchair Mobility     Tilt Bed    Modified Rankin (Stroke Patients Only)       Balance Overall balance assessment: Needs assistance Sitting-balance support: Feet supported Sitting balance-Leahy Scale: Good     Standing balance support: Reliant on assistive device for balance Standing balance-Leahy Scale: Fair Standing balance comment: good balance with RW                             Pertinent Vitals/Pain Pain Assessment Pain Assessment: Faces Faces Pain Scale: Hurts a little bit Pain Location: substernal and L breast. Pain Descriptors / Indicators: Aching Pain Intervention(s): Limited activity within patient's tolerance, Monitored during session, Repositioned    Home Living Family/patient expects to be discharged to:: Private residence Living Arrangements: Children Available Help at Discharge: Family;Available 24 hours/day Type of Home: House Home Access: Stairs to enter   [employer] of Steps: 1  Home Layout: One level Home Equipment: Agricultural consultant (2 wheels);Rollator (4 wheels);Grab bars - tub/shower;Shower seat Additional Comments: lives with her daughter/family, daughter assisting more with lower body ADL    Prior Function Prior Level of Function : Needs assist       Physical Assist : ADLs (physical)   ADLs  (physical): Bathing;Dressing Mobility Comments: Reports increased use of 2WRW over the past week, normally does not use RW. ADLs Comments: Pt reports increased assist with lower body ADL from daughter.  Daughter assists with IADLs.     Extremity/Trunk Assessment   Upper Extremity Assessment Upper Extremity Assessment: Defer to OT evaluation    Lower Extremity Assessment Lower Extremity Assessment: Overall WFL for tasks assessed    Cervical / Trunk Assessment Cervical / Trunk Assessment: Normal  Communication   Communication Communication: No apparent difficulties    Cognition Arousal: Alert Behavior During Therapy: WFL for tasks assessed/performed   PT - Cognitive impairments: No apparent impairments                         Following commands: Intact       Cueing Cueing Techniques: Verbal cues     General Comments General comments (skin integrity, edema, etc.): 72 bpm, 100% RA, 86/68 on arrival.  Sitting 66 bpm, 155/77; standing 70 bpm, 155/76    Exercises     Assessment/Plan    PT Assessment Patient needs continued PT services  PT Problem List Decreased activity tolerance;Decreased balance;Decreased mobility;Decreased knowledge of use of DME;Decreased safety awareness;Decreased knowledge of precautions       PT Treatment Interventions DME instruction;Gait training;Functional mobility training;Therapeutic activities;Stair training;Therapeutic exercise;Patient/family education    PT Goals (Current goals can be found in the Care Plan section)  Acute Rehab PT Goals Patient Stated Goal: to go home PT Goal Formulation: With patient Time For Goal Achievement: 03/27/24 Potential to Achieve Goals: Good    Frequency Min 2X/week     Co-evaluation               AM-PAC PT "6 Clicks" Mobility  Outcome Measure Help needed turning from your back to your side while in a flat bed without using bedrails?: None Help needed moving from lying on your back  to sitting on the side of a flat bed without using bedrails?: None Help needed moving to and from a bed to a chair (including a wheelchair)?: A Little Help needed standing up from a chair using your arms (e.g., wheelchair or bedside chair)?: A Little Help needed to walk in hospital room?: A Little Help needed climbing 3-5 steps with a railing? : A Lot 6 Click Score: 19    End of Session Equipment Utilized During Treatment: Gait belt Activity Tolerance: Patient tolerated treatment well Patient left: with call bell/phone within reach (sitting edge of stretcher) Nurse Communication: Mobility status PT Visit Diagnosis: Muscle weakness (generalized) (M62.81)    Time: 1610-9604 PT Time Calculation (min) (ACUTE ONLY): 18 min   Charges:   PT Evaluation $PT Eval Moderate Complexity: 1 Mod   PT General Charges $$ ACUTE PT VISIT: 1 Visit         Bertel Venard M,PT Acute Rehab Services [phone]   Florencia Hunter 03/13/2024, 1:50 PM

## 2024-03-13 NOTE — Progress Notes (Signed)
  Echocardiogram 2D Echocardiogram has been performed.  Farley Honer, RDCS 03/13/2024, 2:53 PM

## 2024-03-13 NOTE — Discharge Planning (Signed)
 [name], BSN, RN, [place] [phone] Pt qualifies for DME [facility]) 3n1.  DME  ordered through [employer] [facility].  Mitch of [employer] notified to deliver DME to pt room prior to D/C home.

## 2024-03-13 NOTE — Hospital Course (Signed)
 Sherry Fitzpatrick is a 73 y.o. female with medical history significant for coronary artery disease status post PCI with stent placement, moderate aortic stenosis, chronic HFpEF, restrictive cardiomyopathy, history of CVA and TIAs, paroxysmal A-fib on Eliquis , hypertension, hyperlipidemia, type 2 diabetes, OSA, CKD 3B, presented to hospital with sudden onset of left-sided chest pain that woke her up from sleep.  Feels like pressure.  In the ED patient was bradycardic and tachypneic.  Troponins were negative.  Creatinine was elevated at 1.9 above baseline of 1.4.  Troponins were flat.  Patient was then placed in observation in the hospital.    Atypical chest pain likely musculoskeletal Patient does have reproducible tenderness on palpation.  Troponins negative.  Seen by cardiology in no further workup is recommended.  2D echocardiogram with   AKI on CKD 3B Baseline creatinine 1.4 with GFR 37.  Creatinine on presentation at 1.9.  Has improved to 1.7.  Will need to continue to monitor as outpatient. Received gentle hydration during hospitalization.  Chronic HFpEF Compensated at this time.   Type 2 diabetes with hyperglycemia Last hemoglobin A1c 7.0 on 09/02/2023.  Continue diabetic diet.   Paroxysmal A-fib on Eliquis  Rate controlled.  Continue Eliquis .   History of CVA and TIAs Resume home Eliquis  and Lipitor    Hyperlipidemia Continue Lipitor .   Severe obesity BMI 40, Continue weight loss efforts..   Generalized weakness Patient feels at baseline.

## 2024-03-13 NOTE — ED Notes (Signed)
 This RN called CCMD to place pt on monitoring

## 2024-03-13 NOTE — Discharge Summary (Signed)
 Physician Discharge Summary  Sherry Fitzpatrick ZOX:[id number] DOB: November 08, 1950 DOA: 03/12/2024  PCP: Ransom Byers, MD  Admit date: 03/12/2024 Discharge date: 03/13/2024  Admitted From: Home  Discharge disposition: home with [facility]   Recommendations for Outpatient Follow-Up:   Follow up with your primary care provider in one week.  Check CBC, BMP, magnesium in the next visit   Discharge Diagnosis:   Active Problems:   Chest pain  Discharge Condition: Improved.  Diet recommendation: Low sodium, heart healthy.  Carbohydrate-modified.    Wound care: None.  Code status: Full.   History of Present Illness:   Sherry Fitzpatrick is a 73 y.o. female with medical history significant for coronary artery disease status post PCI with stent placement, moderate aortic stenosis, chronic HFpEF, restrictive cardiomyopathy, history of CVA and TIAs, paroxysmal A-fib on Eliquis , hypertension, hyperlipidemia, type 2 diabetes, OSA, CKD 3B, presented to hospital with sudden onset of left-sided chest pain that woke her up from sleep.  Feelt like pressure.  In the ED patient was bradycardic and tachypneic.  Troponins were negative.  Creatinine was elevated at 1.9 above baseline of 1.4.  Troponins were flat.  Patient was then placed in observation in the hospital.     Hospital Course:   Following conditions were addressed during hospitalization as listed below,  Atypical chest pain likely musculoskeletal Patient does have reproducible tenderness on palpation.  Troponins negative.  Seen by cardiology in no further workup is recommended.  2D echocardiogram with preserved LV function and wall motion is ok. Advised supportive care.   AKI on CKD 3B Baseline creatinine 1.4 with GFR 37.  Creatinine on presentation at 1.9.  Has improved to 1.7.  Will need to continue to monitor as outpatient. Received gentle hydration during hospitalization.   Chronic HFpEF Compensated at this time.   Type 2 diabetes  with hyperglycemia Last hemoglobin A1c 7.0 on 09/02/2023.  Continue diabetic diet.   Paroxysmal A-fib on Eliquis  Rate controlled.  Continue Eliquis .   History of CVA and TIAs Resume home Eliquis  and Lipitor    Hyperlipidemia Continue Lipitor .   Severe obesity BMI 40, Continue weight loss efforts..   Generalized weakness Patient feels at baseline. Will get PT and OT on discharge   Disposition.  At this time, patient is stable for disposition home with Home health  Medical Consultants:   None.  Procedures:    2 D echocardiogram Subjective:   Today, patient was seen and examined at bedside.  Complains of left chest wall pain especially on palpation and movement of the wall.  Discharge Exam:   Vitals:   03/13/24 1400 03/13/24 1435  BP: (!) 150/74 (!) 175/96  Pulse: 62 62  Resp:  19  Temp:  97.8 F (36.6 C)  SpO2: 100% 100%   Vitals:   03/13/24 1115 03/13/24 1330 03/13/24 1400 03/13/24 1435  BP: (!) 86/68 (!) 130/57 (!) 150/74 (!) 175/96  Pulse: 61 66 62 62  Resp: 19   19  Temp: 97.8 F (36.6 C)   97.8 F (36.6 C)  TempSrc: Oral   Oral  SpO2: 98% 95% 100% 100%  Weight:      Height:        General: Alert awake, not in obvious distress, obese built HENT: pupils equally reacting to light,  No scleral pallor or icterus noted. Oral mucosa is moist.  Chest:   Diminished breath sounds bilaterally. No crackles or wheezes.  Tenderness on palpation of the left chest wall which is reproducible  CVS: S1 &S2 heard. No murmur.  Regular rate and rhythm. Abdomen: Soft, nontender, nondistended.  Bowel sounds are heard.   Extremities: No cyanosis, clubbing or edema.  Peripheral pulses are palpable. Psych: Alert, awake and oriented, normal mood CNS:  No cranial nerve deficits.  Power equal in all extremities.   Skin: Warm and dry.  No rashes noted.  The results of significant diagnostics from this hospitalization (including imaging, microbiology, ancillary and laboratory)  are listed below for reference.     Diagnostic Studies:   ECHOCARDIOGRAM COMPLETE Result Date: 03/13/2024    ECHOCARDIOGRAM REPORT   Patient Name:   Sherry Fitzpatrick [name] Date of Exam: 03/13/2024 Medical Rec #:  [id number]       Height:       63.0 in Accession #:    [id number]      Weight:       227.1 lb Date of Birth:  08/10/1951        BSA:          2.041 m Patient Age:    72 years        BP:           130/57 mmHg Patient Gender: F               HR:           64 bpm. Exam Location:  Inpatient Procedure: 2D Echo, Color Doppler and Cardiac Doppler (Both Spectral and Color            Flow Doppler were utilized during procedure). Indications:    Chest Pain R07.9  History:        Patient has prior history of Echocardiogram examinations, most                 recent 09/02/2023. COPD, Signs/Symptoms:Chest Pain and Shortness                 of Breath; Risk Factors:Dyslipidemia, Hypertension and Diabetes.  Sonographer:    Kip Peon RDCS Referring Phys: [id number] Charlotta Cook HALL  Sonographer Comments: Image acquisition challenging due to respiratory motion. IMPRESSIONS  1. Left ventricular ejection fraction, by estimation, is 65 to 70%. The left ventricle has normal function. Left ventricular endocardial border not optimally defined, however wall motion appears grossly normal. There is moderate left ventricular hypertrophy. Left ventricular diastolic parameters are consistent with Grade II diastolic dysfunction (pseudonormalization).  2. Right ventricular systolic function is normal. The right ventricular size is normal. There is normal pulmonary artery systolic pressure. The estimated right ventricular systolic pressure is 23.6 mmHg.  3. Left atrial size was moderately dilated.  4. The mitral valve is grossly normal. Trivial mitral valve regurgitation. No evidence of mitral stenosis.  5. The aortic valve is abnormal. There is severe calcifcation of the aortic valve. Aortic valve regurgitation is not visualized. Moderate  aortic valve stenosis by prior echo and visual evaluation of the valve. MG on current echo unable to reproduce prior gradients, today 18 mmHg.  6. The inferior vena cava is normal in size with greater than 50% respiratory variability, suggesting right atrial pressure of 3 mmHg. FINDINGS  Left Ventricle: Left ventricular ejection fraction, by estimation, is 65 to 70%. The left ventricle has normal function. Left ventricular endocardial border not optimally defined to evaluate regional wall motion. The left ventricular internal cavity size was normal in size. There is moderate left ventricular hypertrophy. Left ventricular diastolic parameters are consistent with Grade II diastolic dysfunction (pseudonormalization). Right Ventricle: The  right ventricular size is normal. No increase in right ventricular wall thickness. Right ventricular systolic function is normal. There is normal pulmonary artery systolic pressure. The tricuspid regurgitant velocity is 2.27 m/s, and  with an assumed right atrial pressure of 3 mmHg, the estimated right ventricular systolic pressure is 23.6 mmHg. Left Atrium: Left atrial size was moderately dilated. Right Atrium: Right atrial size was normal in size. Pericardium: There is no evidence of pericardial effusion. Mitral Valve: The mitral valve is grossly normal. Trivial mitral valve regurgitation. No evidence of mitral valve stenosis. Tricuspid Valve: The tricuspid valve is normal in structure. Tricuspid valve regurgitation is trivial. No evidence of tricuspid stenosis. Aortic Valve: The aortic valve is abnormal. There is severe calcifcation of the aortic valve. Aortic valve regurgitation is not visualized. Moderate aortic stenosis is present. Aortic valve mean gradient measures 18.0 mmHg. Aortic valve peak gradient measures 32.3 mmHg. Aortic valve area, by VTI measures 1.17 cm. Pulmonic Valve: The pulmonic valve was normal in structure. Pulmonic valve regurgitation is trivial. No evidence  of pulmonic stenosis. Aorta: The aortic root is normal in size and structure. Venous: The inferior vena cava is normal in size with greater than 50% respiratory variability, suggesting right atrial pressure of 3 mmHg. IAS/Shunts: No atrial level shunt detected by color flow Doppler.  LEFT VENTRICLE PLAX 2D LVIDd:         4.70 cm   Diastology LVIDs:         2.80 cm   LV e' medial:    5.03 cm/s LV PW:         1.30 cm   LV E/e' medial:  22.5 LV IVS:        1.30 cm   LV e' lateral:   6.97 cm/s LVOT diam:     1.80 cm   LV E/e' lateral: 16.2 LV SV:         73 LV SV Index:   36 LVOT Area:     2.54 cm  RIGHT VENTRICLE             IVC RV S prime:     13.50 cm/s  IVC diam: 1.40 cm TAPSE (M-mode): 1.8 cm LEFT ATRIUM           Index LA diam:      4.20 cm 2.06 cm/m LA Vol (A4C): 48.3 ml 23.67 ml/m  AORTIC VALVE AV Area (Vmax):    1.19 cm AV Area (Vmean):   1.14 cm AV Area (VTI):     1.17 cm AV Vmax:           284.00 cm/s AV Vmean:          196.000 cm/s AV VTI:            0.626 m AV Peak Grad:      32.3 mmHg AV Mean Grad:      18.0 mmHg LVOT Vmax:         132.50 cm/s LVOT Vmean:        88.000 cm/s LVOT VTI:          0.288 m LVOT/AV VTI ratio: 0.46  AORTA Ao Root diam: 2.80 cm Ao Asc diam:  3.30 cm MITRAL VALVE                TRICUSPID VALVE MV Area (PHT): 2.95 cm     TR Peak grad:   20.6 mmHg MV Decel Time: 257 msec     TR Vmax:  227.00 cm/s MR Peak grad: 48.4 mmHg MR Vmax:      348.00 cm/s   SHUNTS MV E velocity: 113.00 cm/s  Systemic VTI:  0.29 m MV A velocity: 85.90 cm/s   Systemic Diam: 1.80 cm MV E/A ratio:  1.32 Grady Lawman MD Electronically signed by Grady Lawman MD Signature Date/Time: 03/13/2024/5:27:41 PM    Final    CT Head Wo Contrast Result Date: 03/13/2024 CLINICAL DATA:  New onset headache EXAM: CT HEAD WITHOUT CONTRAST TECHNIQUE: Contiguous axial images were obtained from the base of the skull through the vertex without intravenous contrast. RADIATION DOSE REDUCTION: This exam was performed  according to the departmental dose-optimization program which includes automated exposure control, adjustment of the mA and/or kV according to patient size and/or use of iterative reconstruction technique. COMPARISON:  MRI head and CT head 08/31/2023 FINDINGS: Brain: No intracranial hemorrhage, mass effect, or evidence of acute infarct. No hydrocephalus. No extra-axial fluid collection. Age-commensurate cerebral atrophy and chronic small vessel ischemic disease. Vascular: No hyperdense vessel. Intracranial arterial calcification. Skull: No fracture or focal lesion. Sinuses/Orbits: No acute finding. Other: None. IMPRESSION: No acute intracranial abnormality. Electronically Signed   By: Rozell Cornet M.D.   On: 03/13/2024 01:48   DG HIP UNILAT WITH PELVIS 2-3 VIEWS LEFT Result Date: 03/13/2024 CLINICAL DATA:  Pain EXAM: DG HIP (WITH OR WITHOUT PELVIS) 2-3V LEFT COMPARISON:  None Available. FINDINGS: Prior right hip replacement. Moderate degenerative changes in the left hip with joint space loss and spurring. No acute bony abnormality. Specifically, no fracture, subluxation, or dislocation. SI joints symmetric. IMPRESSION: No acute bony abnormality. Electronically Signed   By: Janeece Mechanic M.D.   On: 03/13/2024 01:20   DG Chest 2 View Result Date: 03/12/2024 CLINICAL DATA:  chest pain EXAM: CHEST - 2 VIEW COMPARISON:  August 31, 2023 FINDINGS: Lower lung volumes. No focal airspace consolidation, pleural effusion, or pneumothorax. Unchanged moderate cardiomegaly. Aortic atherosclerosis. No acute fracture or destructive lesion. Multilevel thoracic osteophytosis. IMPRESSION: Unchanged cardiomegaly with central vascular pulmonary congestion. No pneumonia or pulmonary edema. Electronically Signed   By: Rance Burrows M.D.   On: 03/12/2024 21:12     Labs:   Basic Metabolic Panel: Recent Labs  Lab 03/12/24 2002 03/13/24 0549  NA 139 140  K 3.9 3.0*  CL 105 109  CO2 22 23  GLUCOSE 115* 114*  BUN 30*  27*  CREATININE 1.92* 1.72*  CALCIUM  8.7* 7.9*  MG  --  1.8  PHOS  --  3.9   GFR Estimated Creatinine Clearance: 32.9 mL/min (A) (by C-G formula based on SCr of 1.72 mg/dL (H)). Liver Function Tests: No results for input(s): "AST", "ALT", "ALKPHOS", "BILITOT", "PROT", "ALBUMIN " in the last 168 hours. No results for input(s): "LIPASE", "AMYLASE" in the last 168 hours. No results for input(s): "AMMONIA" in the last 168 hours. Coagulation profile No results for input(s): "INR", "PROTIME" in the last 168 hours.  CBC: Recent Labs  Lab 03/12/24 2002 03/13/24 0549  WBC 6.4 6.2  HGB 11.5* 9.5*  HCT 35.1* 28.9*  MCV 87.8 88.4  PLT 285 232   Cardiac Enzymes: No results for input(s): "CKTOTAL", "CKMB", "CKMBINDEX", "TROPONINI" in the last 168 hours. BNP: Invalid input(s): "POCBNP" CBG: Recent Labs  Lab 03/13/24 0800 03/13/24 1147  GLUCAP 130* 133*   D-Dimer No results for input(s): "DDIMER" in the last 72 hours. Hgb A1c Recent Labs    03/13/24 0549  HGBA1C 6.0*   Lipid Profile Recent Labs    03/13/24  0549  CHOL 220*  HDL 30*  LDLCALC 159*  TRIG 154*  CHOLHDL 7.3   Thyroid  function studies No results for input(s): "TSH", "T4TOTAL", "T3FREE", "THYROIDAB" in the last 72 hours.  Invalid input(s): "FREET3" Anemia work up No results for input(s): "VITAMINB12", "FOLATE", "FERRITIN", "TIBC", "IRON", "RETICCTPCT" in the last 72 hours. Microbiology No results found for this or any previous visit (from the past 240 hours).   Discharge Instructions:   Discharge Instructions     Call MD for:  severe uncontrolled pain   Complete by: As directed    Diet - low sodium heart healthy   Complete by: As directed    Diet Carb Modified   Complete by: As directed    Discharge instructions   Complete by: As directed    Follow up with your primary care provider in 1 week.  Check blood work at that time.  Seek medical attention for worsening symptoms.  Apply heating pad 2-3  times a day on the left chest wall.  Okay to use over-the-counter analgesic gels like "Moov" or Bengay.   Increase activity slowly   Complete by: As directed       Allergies as of 03/13/2024       Reactions   Morphine  And Codeine Hives, Nausea And Vomiting   Penicillins Hives   Percocet [oxycodone -acetaminophen ] Nausea And Vomiting   Ace Inhibitors Nausea And Vomiting, Cough   Iodinated Contrast Media Nausea And Vomiting      Iodine Itching   Bonine [meclizine Hcl] Other (See Comments)   Oversedation    Lodine [etodolac] Nausea And Vomiting   Ultram  [tramadol ] Nausea And Vomiting   Vicodin [hydrocodone-acetaminophen ] Nausea And Vomiting        Medication List     TAKE these medications    acetaminophen  325 MG tablet Commonly known as: TYLENOL  Take 2 tablets (650 mg total) by mouth every 6 (six) hours as needed (Mild pain). What changed:  when to take this reasons to take this   albuterol  108 (90 Base) MCG/ACT inhaler Commonly known as: VENTOLIN  HFA Inhale 2 puffs into the lungs every 6 (six) hours as needed for wheezing.   amiodarone  200 MG tablet Commonly known as: PACERONE  Take 1 tablet (200 mg total) by mouth daily. What changed: how much to take   amLODipine  5 MG tablet Commonly known as: NORVASC  Take 5 mg by mouth daily.   Aspirin  Low Dose 81 MG chewable tablet Generic drug: aspirin  Chew 1 tablet (81 mg total) by mouth daily.   atorvastatin  40 MG tablet Commonly known as: LIPITOR  Take 40 mg by mouth at bedtime.   budesonide -formoterol  160-4.5 MCG/ACT inhaler Commonly known as: Symbicort  Inhale 2 puffs into the lungs 2 (two) times daily. What changed: when to take this   carvedilol  6.25 MG tablet Commonly known as: COREG  Take 1 tablet (6.25 mg total) by mouth 2 (two) times daily.   Eliquis  5 MG Tabs tablet Generic drug: apixaban  Take 1 tablet (5 mg total) by mouth 2 (two) times daily.   furosemide  40 MG tablet Commonly known as: LASIX  Take 1  tablet (40 mg total) by mouth daily at 2 PM.   hydrALAZINE  25 MG tablet Commonly known as: APRESOLINE  Take 1 tablet (25 mg total) by mouth every 8 (eight) hours.   isosorbide  mononitrate 120 MG 24 hr tablet Commonly known as: IMDUR  Take 120 mg by mouth daily.   Multivitamin Women 50+ Tabs Take 1 tablet by mouth daily.   pantoprazole  40  MG tablet Commonly known as: PROTONIX  Take 1 tablet (40 mg total) by mouth daily.   polyethylene glycol 17 g packet Commonly known as: MIRALAX  / GLYCOLAX  Take 17 g by mouth daily as needed for moderate constipation.   potassium chloride  SA 20 MEQ tablet Commonly known as: KLOR-CON  M Take 1 tablet (20 mEq total) by mouth daily.   ranolazine  500 MG 12 hr tablet Commonly known as: Ranexa  Take 1 tablet (500 mg total) by mouth 2 (two) times daily.   sertraline  25 MG tablet Commonly known as: ZOLOFT  Take 25 mg by mouth in the morning.   Stool Softener 100 MG capsule Generic drug: Docusate Sodium  Take 100 mg by mouth 2 (two) times daily as needed for constipation.               Durable Medical Equipment  (From admission, onward)           Start     Ordered   03/13/24 1224  For home use only DME 3 n 1  Once        03/13/24 1223            Follow-up Information     [facility] Follow up.   Why: Office will call to schedule Home Health Physical Therapy Contact information: [address] [place] [address] [phone]         Ransom Byers, MD Follow up.   Specialty: Family Medicine Contact information: [address] [place] [address] [phone]                  Time coordinating discharge: 39 minutes  Signed:  Sherrin Stahle  [facility] 03/13/2024, 5:53 PM

## 2024-03-13 NOTE — Progress Notes (Signed)
    Durable Medical Equipment  (From admission, onward)           Start     Ordered   03/13/24 1224  For home use only DME 3 n 1  Once        03/13/24 1223           Pt is confined to a room without a toilet due to sever obesity (BMI 40) .  Will require a 3n1/bedside commode.

## 2024-03-13 NOTE — Evaluation (Signed)
 Occupational Therapy Evaluation Patient Details Name: Sherry Fitzpatrick MRN: [id number] DOB: 02/23/1951 Today's Date: 03/13/2024   History of Present Illness   73 y.o. female with medical history significant for coronary artery disease status post PCI with stent placement, moderate aortic stenosis, chronic HFpEF, restrictive cardiomyopathy, history of CVA and TIAs, paroxysmal A-fib on Eliquis , hypertension, hyperlipidemia, type 2 diabetes, OSA, CKD 3B, who presented 03/12/24 ER due to sudden onset left-sided chest pain with SOB with mobility.     Clinical Impressions Patient admitted for the diagnosis above.  PTA she lives at home with her daughter and granddaughter, who provide assist with lower body ADL as needed.  Daughter also assists with community mobility and iADL.  Patient self reports increased use of 2WRW over the past week due to SOB with mobility.  Patient continues to experience substernal discomfort and SOB with short bouts of mobility.  OT will continue efforts in the acute setting as her medical plan continues to be worked up, but no post acute OT is anticipated.       If plan is discharge home, recommend the following:   Assist for transportation     Functional Status Assessment   Patient has had a recent decline in their functional status and demonstrates the ability to make significant improvements in function in a reasonable and predictable amount of time.     Equipment Recommendations   None recommended by OT     Recommendations for Other Services         Precautions/Restrictions   Precautions Precautions: Fall Precaution/Restrictions Comments: Watch BP Restrictions Weight Bearing Restrictions Per Provider Order: No     Mobility Bed Mobility Overal bed mobility: Needs Assistance Bed Mobility: Supine to Sit, Sit to Supine     Supine to sit: Supervision Sit to supine: Supervision        Transfers Overall transfer level: Needs assistance    Transfers: Sit to/from Stand Sit to Stand: Supervision                  Balance Overall balance assessment: Needs assistance Sitting-balance support: Feet supported Sitting balance-Leahy Scale: Good     Standing balance support: Reliant on assistive device for balance Standing balance-Leahy Scale: Fair                             ADL either performed or assessed with clinical judgement   ADL       Grooming: Supervision/safety;Standing               Lower Body Dressing: Minimal assistance;Sit to/from stand   Toilet Transfer: Ambulance person;Ambulation                   Vision Patient Visual Report: No change from baseline       Perception Perception: Not tested       Praxis Praxis: Not tested       Pertinent Vitals/Pain Pain Assessment Pain Assessment: Faces Faces Pain Scale: Hurts a little bit Pain Location: substernal and L breast. Pain Descriptors / Indicators: Aching Pain Intervention(s): Monitored during session     Extremity/Trunk Assessment Upper Extremity Assessment Upper Extremity Assessment: Overall WFL for tasks assessed   Lower Extremity Assessment Lower Extremity Assessment: Defer to PT evaluation   Cervical / Trunk Assessment Cervical / Trunk Assessment: Normal   Communication Communication Communication: No apparent difficulties   Cognition Arousal: Alert Behavior During Therapy: [facility] for tasks  assessed/performed Cognition: No apparent impairments                               Following commands: Intact       Cueing  General Comments   Cueing Techniques: Verbal cues   BP 180/91   Exercises     Shoulder Instructions      Home Living Family/patient expects to be discharged to:: Private residence Living Arrangements: Children Available Help at Discharge: Family;Available 24 hours/day Type of Home: House Home Access: Stairs to enter [employer] of  Steps: 1   Home Layout: One level     Bathroom Shower/Tub: Chief Strategy Officer: Handicapped height Bathroom Accessibility: Yes How Accessible: Accessible via walker Home Equipment: Rolling Walker (2 wheels);Rollator (4 wheels);Grab bars - tub/shower;BSC/3in1;Shower seat   Additional Comments: lives with her daughter/family, daughter assisting more with lower body ADL      Prior Functioning/Environment Prior Level of Function : Needs assist       Physical Assist : ADLs (physical)   ADLs (physical): Bathing;Dressing Mobility Comments: Reports increased use of 2WRW over the past week, normally does not use RW. ADLs Comments: Pt reports increased assist with lower body ADL from daughter.  Daughter assists with IADLs.    OT Problem List: Decreased activity tolerance;Impaired balance (sitting and/or standing);Pain   OT Treatment/Interventions: Self-care/ADL training;Therapeutic activities;Balance training;DME and/or AE instruction;Energy conservation;Patient/family education      OT Goals(Current goals can be found in the care plan section)   Acute Rehab OT Goals Patient Stated Goal: Return home home OT Goal Formulation: With patient Time For Goal Achievement: 03/27/24 Potential to Achieve Goals: Good ADL Goals Pt Will Perform Grooming: with modified independence;standing Pt Will Perform Lower Body Dressing: with modified independence;sit to/from stand Pt Will Transfer to Toilet: with modified independence;ambulating;regular height toilet   OT Frequency:  Min 2X/week    Co-evaluation              AM-PAC OT "6 Clicks" Daily Activity     Outcome Measure Help from another person eating meals?: None Help from another person taking care of personal grooming?: A Little Help from another person toileting, which includes using toliet, bedpan, or urinal?: A Little Help from another person bathing (including washing, rinsing, drying)?: A Little Help from  another person to put on and taking off regular upper body clothing?: None Help from another person to put on and taking off regular lower body clothing?: A Little 6 Click Score: 20   End of Session Equipment Utilized During Treatment: Rolling walker (2 wheels) Nurse Communication: Mobility status  Activity Tolerance: Patient tolerated treatment well Patient left: in bed;with call bell/phone within reach  OT Visit Diagnosis: Unsteadiness on feet (R26.81)                Time: 1610-9604 OT Time Calculation (min): 25 min Charges:  OT General Charges $OT Visit: 1 Visit OT Evaluation $OT Eval Moderate Complexity: 1 Mod OT Treatments $Self Care/Home Management : 8-22 mins  03/13/2024  RP, OTR/L  Acute Rehabilitation Services  Office:  [phone]   Benjamen Brand 03/13/2024, 9:26 AM

## 2024-03-13 NOTE — H&P (Addendum)
 History and Physical  DAQUISHA CLERMONT ZOX:[id number] DOB: [birth year] DOA: 03/12/2024  Referring physician: Dr. Wallis Gun, EDP  PCP: Ransom Byers, MD  Outpatient Specialists: Cardiology. Patient coming from: Home.  Chief Complaint: Chest pain for the past 2 nights.  HPI: Sherry Fitzpatrick is a 73 y.o. female with medical history significant for coronary artery disease status post PCI with stent placement, moderate aortic stenosis, chronic HFpEF, restrictive cardiomyopathy, history of CVA and TIAs, paroxysmal A-fib on Eliquis , hypertension, hyperlipidemia, type 2 diabetes, OSA, CKD 3B, who presents to the ER due to sudden onset left-sided chest pain that woke her out of her sleep for the past 2 nights.  Pressure-like, radiated to her left shoulder, similar to prior chest pain that led to PCI with stent placement.  Associated with dyspnea.  Worse with ambulation.  In the ER, mildly bradycardic and tachypneic.  High-sensitivity troponin negative x 2.  At the time of this visit, her symptoms had improved.  Admitted by [facility], hospitalist service, for chest pain rule ACS.   ED Course: Temperature, 98.3.  BP 162/80, pulse 55, respiration rate 16, O2 saturation 94% on room air.  Lab studies notable for creatinine 1.92 above baseline of 1.4.  Troponin 16, 16.  Hemoglobin 11.5.  WBC 6.4, platelet count 285.  Sed rate 28.  Review of Systems: Review of systems as noted in the HPI. All other systems reviewed and are negative.   Past Medical History:  Diagnosis Date   Acute non-Q wave non-ST elevation myocardial infarction (NSTEMI) (HCC) 05/24/2020   Acute respiratory failure with hypoxia (HCC) 11/22/2019   AKI (acute kidney injury) (HCC) 07/03/2018   Alterations of sensations, late effect of cerebrovascular disease(438.6)    Arthritis    "all over"   Backache, unspecified    Body mass index 40.0-44.9, adult (HCC)    Carpal tunnel syndrome    Cerebral thrombosis with cerebral infarction  05/29/2020   Dizziness and giddiness    Esophageal reflux    Generalized pain    Gout attack 03/28/2020   Headache    "had alot til I had pituitary tumor removed"   Heart murmur    Hypertensive encephalopathy 03/28/2020   Mild cognitive impairment 07/16/2022   NSTEMI (non-ST elevated myocardial infarction) (HCC)    Other abnormal glucose    Other and unspecified hyperlipidemia    Other malaise and fatigue    Other postprocedural status(V45.89)    Other symptoms involving cardiovascular system    Shock (HCC)    Sleep apnea    "did test; they wanted me to get a CPAP but I never did get it" (04/14/2015) Does use inhaler at night (01 2021)   Stroke (HCC) 06/2003; 02/2010   "minor; minor" ; denies residual on 04/14/2015   Unspecified disorder of the pituitary gland and its hypothalamic control    Unspecified essential hypertension    Unspecified visual disturbance    Past Surgical History:  Procedure Laterality Date   CARPAL TUNNEL RELEASE Left ~ 2014   CORONARY STENT INTERVENTION N/A 05/24/2020   Procedure: CORONARY STENT INTERVENTION;  Surgeon: Lucendia Rusk, MD;  Location: [facility];  Service: Cardiovascular;  Laterality: N/A;   JOINT REPLACEMENT     LEFT HEART CATH AND CORONARY ANGIOGRAPHY N/A 05/24/2020   Procedure: LEFT HEART CATH AND CORONARY ANGIOGRAPHY;  Surgeon: Chapman Commodore, MD;  Location: [facility];  Service: Cardiovascular;  Laterality: N/A;   RIGHT HEART CATH AND CORONARY ANGIOGRAPHY N/A 09/04/2023  Procedure: RIGHT HEART CATH AND CORONARY ANGIOGRAPHY;  Surgeon: Wenona Hamilton, MD;  Location: [facility];  Service: Cardiovascular;  Laterality: N/A;   TOTAL HIP ARTHROPLASTY Right 11/06/2010   TRANSPHENOIDAL / TRANSNASAL HYPOPHYSECTOMY / RESECTION PITUITARY TUMOR  06/13/2012   TUBAL LIGATION  1974    Social History:  reports that she has never smoked. She has never used smokeless tobacco. She reports current alcohol use. She reports that  she does not use drugs.   Allergies  Allergen Reactions   Morphine  And Codeine Hives and Nausea And Vomiting   Penicillins Hives    Has patient had a PCN reaction causing immediate rash, facial/tongue/throat swelling, SOB or lightheadedness with hypotension: Yes Has patient had a PCN reaction causing severe rash involving mucus membranes or skin necrosis: No Has patient had a PCN reaction that required hospitalization: No Has patient had a PCN reaction occurring within the last 10 years: No If all of the above answers are "NO", then may proceed with Cephalosporin use.   Percocet [Oxycodone -Acetaminophen ] Nausea And Vomiting   Ace Inhibitors Nausea And Vomiting and Cough   Iodinated Contrast Media Nausea And Vomiting        Iodine Itching   Etodolac Nausea And Vomiting   Meclizine Hcl Other (See Comments)    Oversedated    Tramadol  Nausea And Vomiting   Vicodin [Hydrocodone-Acetaminophen ] Nausea And Vomiting    Family History  Problem Relation Age of Onset   Heart failure Mother    Hypertension Mother    Diabetes Father    Hypertension Sister    Heart failure Sister    Hypertension Daughter       Prior to Admission medications   Medication Sig Start Date End Date Taking? Authorizing Provider  acetaminophen  (TYLENOL ) 325 MG tablet Take 2 tablets (650 mg total) by mouth every 6 (six) hours as needed (Mild pain). Patient taking differently: Take 650 mg by mouth as needed for mild pain (pain score 1-3) or moderate pain (pain score 4-6). 06/09/20   Angiulli, Everlyn Hockey, PA-C  albuterol  (VENTOLIN  HFA) 108 (90 Base) MCG/ACT inhaler Inhale 2 puffs into the lungs every 6 (six) hours as needed for wheezing. 12/26/22   Pokhrel, Laxman, MD  amiodarone  (PACERONE ) 200 MG tablet Take 1 tablet (200 mg total) by mouth daily. 09/06/23   Ozell Blunt, MD  amLODipine  (NORVASC ) 10 MG tablet Take 1 tablet (10 mg total) by mouth daily. 09/06/23   Ozell Blunt, MD  apixaban  (ELIQUIS ) 5 MG TABS tablet  Take 1 tablet (5 mg total) by mouth 2 (two) times daily. 09/05/23   Ozell Blunt, MD  aspirin  81 MG chewable tablet Chew 1 tablet (81 mg total) by mouth daily. 09/06/23   Ozell Blunt, MD  atorvastatin  (LIPITOR ) 40 MG tablet Take 40 mg by mouth at bedtime. 08/01/23   [provider]  budesonide -formoterol  (SYMBICORT ) 160-4.5 MCG/ACT inhaler Inhale 2 puffs into the lungs 2 (two) times daily. Patient taking differently: Inhale 2 puffs into the lungs in the morning. 07/09/23   Audria Leather, MD  carvedilol  (COREG ) 6.25 MG tablet Take 1 tablet (6.25 mg total) by mouth 2 (two) times daily. 12/26/22 06/30/24  Pokhrel, Amador Bad, MD  ferrous sulfate  325 (65 FE) MG EC tablet Take 325 mg by mouth in the morning. Patient not taking: Reported on 01/07/2024    [provider]  fluticasone  (FLONASE ) 50 MCG/ACT nasal spray Place 2 sprays into both nostrils daily. Patient not taking: Reported on  01/07/2024 09/06/23   Ozell Blunt, MD  furosemide  (LASIX ) 40 MG tablet Take 1 tablet (40 mg total) by mouth daily at 2 PM. Patient taking differently: Take 40 mg by mouth in the morning. 12/26/22 06/30/24  Pokhrel, Laxman, MD  hydrALAZINE  (APRESOLINE ) 25 MG tablet Take 1 tablet (25 mg total) by mouth every 8 (eight) hours. 12/26/22 06/30/24  Pokhrel, Amador Bad, MD  ipratropium-albuterol  (DUONEB) 0.5-2.5 (3) MG/3ML SOLN Take 3 mLs by nebulization every 6 (six) hours as needed (wheezing/shortness of breath). 07/09/23   Audria Leather, MD  isosorbide  mononitrate (IMDUR ) 120 MG 24 hr tablet Take 120 mg by mouth in the morning.    [provider]  meclizine (ANTIVERT) 25 MG tablet Take 25 mg by mouth 2 (two) times daily as needed (vertigo). Patient not taking: Reported on 01/07/2024 08/27/23   [provider]  nitroGLYCERIN  (NITROSTAT ) 0.4 MG SL tablet Place 1 tablet (0.4 mg total) under the tongue every 5 (five) minutes x 3 doses as needed for chest pain. 05/25/20   Chapman Commodore, MD  ondansetron   (ZOFRAN ) 8 MG tablet Take 8 mg by mouth 2 (two) times daily as needed for nausea or vomiting (vertigo). Patient not taking: Reported on 01/07/2024 08/27/23   [provider]  pantoprazole  (PROTONIX ) 40 MG tablet Take 1 tablet (40 mg total) by mouth daily. 07/09/23   Audria Leather, MD  polyethylene glycol (MIRALAX  / GLYCOLAX ) 17 g packet Take 17 g by mouth daily as needed for moderate constipation. 07/09/23   Audria Leather, MD  potassium chloride  SA (KLOR-CON  M) 20 MEQ tablet Take 1 tablet (20 mEq total) by mouth daily. 12/26/22   Pokhrel, Laxman, MD  ranolazine  (RANEXA ) 500 MG 12 hr tablet Take 1 tablet (500 mg total) by mouth 2 (two) times daily. 01/07/24   Tobb, Kardie, DO  sertraline  (ZOLOFT ) 25 MG tablet Take 25 mg by mouth in the morning. 08/01/23   [provider]  STOOL SOFTENER 100 MG capsule Take 100 mg by mouth 2 (two) times daily. 06/16/23   [provider]  potassium chloride  (KLOR-CON ) 10 MEQ tablet Take 2 tablets (20 mEq total) by mouth daily. Patient not taking: Reported on 12/16/2022 06/09/20 12/26/22  Angiulli, Everlyn Hockey, PA-C    Physical Exam: BP (!) 168/85   Pulse (!) 54   Temp 98.3 F (36.8 C)   Resp 15   Ht 5\' 3"  (1.6 m)   Wt 103 kg   SpO2 100%   BMI 40.22 kg/m   General: 73 y.o. year-old female well developed well nourished in no acute distress.  Alert and oriented x3. Cardiovascular: Regular rate and rhythm with no rubs or gallops.  No thyromegaly or JVD noted.  No lower extremity edema. 2/4 pulses in all 4 extremities. Respiratory: Clear to auscultation with no wheezes or rales. Good inspiratory effort. Abdomen: Soft nontender nondistended with normal bowel sounds x4 quadrants. Muskuloskeletal: No cyanosis, clubbing or edema noted bilaterally Neuro: CN II-XII intact, strength, sensation, reflexes Skin: No ulcerative lesions noted or rashes Psychiatry: Judgement and insight appear normal. Mood is appropriate for condition and setting           Labs on Admission:  Basic Metabolic Panel: Recent Labs  Lab 03/12/24 2002  NA 139  K 3.9  CL 105  CO2 22  GLUCOSE 115*  BUN 30*  CREATININE 1.92*  CALCIUM  8.7*   Liver Function Tests: No results for input(s): "AST", "ALT", "ALKPHOS", "BILITOT", "PROT", "ALBUMIN " in the last 168 hours. No  results for input(s): "LIPASE", "AMYLASE" in the last 168 hours. No results for input(s): "AMMONIA" in the last 168 hours. CBC: Recent Labs  Lab 03/12/24 2002  WBC 6.4  HGB 11.5*  HCT 35.1*  MCV 87.8  PLT 285   Cardiac Enzymes: No results for input(s): "CKTOTAL", "CKMB", "CKMBINDEX", "TROPONINI" in the last 168 hours.  BNP (last 3 results) Recent Labs    07/05/23 0943 08/31/23 1146 09/01/23 0658  BNP 147.1* 377.4* 275.5*    ProBNP (last 3 results) No results for input(s): "PROBNP" in the last 8760 hours.  CBG: No results for input(s): "GLUCAP" in the last 168 hours.  Radiological Exams on Admission: CT Head Wo Contrast Result Date: 03/13/2024 CLINICAL DATA:  New onset headache EXAM: CT HEAD WITHOUT CONTRAST TECHNIQUE: Contiguous axial images were obtained from the base of the skull through the vertex without intravenous contrast. RADIATION DOSE REDUCTION: This exam was performed according to the departmental dose-optimization program which includes automated exposure control, adjustment of the mA and/or kV according to patient size and/or use of iterative reconstruction technique. COMPARISON:  MRI head and CT head 08/31/2023 FINDINGS: Brain: No intracranial hemorrhage, mass effect, or evidence of acute infarct. No hydrocephalus. No extra-axial fluid collection. Age-commensurate cerebral atrophy and chronic small vessel ischemic disease. Vascular: No hyperdense vessel. Intracranial arterial calcification. Skull: No fracture or focal lesion. Sinuses/Orbits: No acute finding. Other: None. IMPRESSION: No acute intracranial abnormality. Electronically Signed   By: Rozell Cornet M.D.    On: 03/13/2024 01:48   DG HIP UNILAT WITH PELVIS 2-3 VIEWS LEFT Result Date: 03/13/2024 CLINICAL DATA:  Pain EXAM: DG HIP (WITH OR WITHOUT PELVIS) 2-3V LEFT COMPARISON:  None Available. FINDINGS: Prior right hip replacement. Moderate degenerative changes in the left hip with joint space loss and spurring. No acute bony abnormality. Specifically, no fracture, subluxation, or dislocation. SI joints symmetric. IMPRESSION: No acute bony abnormality. Electronically Signed   By: Janeece Mechanic M.D.   On: 03/13/2024 01:20   DG Chest 2 View Result Date: 03/12/2024 CLINICAL DATA:  chest pain EXAM: CHEST - 2 VIEW COMPARISON:  August 31, 2023 FINDINGS: Lower lung volumes. No focal airspace consolidation, pleural effusion, or pneumothorax. Unchanged moderate cardiomegaly. Aortic atherosclerosis. No acute fracture or destructive lesion. Multilevel thoracic osteophytosis. IMPRESSION: Unchanged cardiomegaly with central vascular pulmonary congestion. No pneumonia or pulmonary edema. Electronically Signed   By: Rance Burrows M.D.   On: 03/12/2024 21:12    EKG: I independently viewed the EKG done and my findings are as followed:    Assessment/Plan Present on Admission:  Chest pain  Active Problems:   Chest pain  Atypical chest pain, rule out ACS. High-sensitivity troponin negative x 2 Follow fasting lipid panel and 2D echo Monitor on telemetry Recommend close follow-up with cardiology outpatient if workup is unremarkable.  AKI on CKD 3B Baseline creatinine 1.4 with GFR 37 Presented with creatinine 1.92 with GFR 27 Gentle IV fluid hydration LR 50 cc/h x 8 hours Monitor urine output Avoid nephrotoxic agents, dehydration, and hypotension.  Chronic HFpEF Euvolemic on exam Monitor strict I's and O's and daily weight Closely monitor volume status while on gentle IV fluid hydration LR 50 cc/h x 8 hours  Type 2 diabetes with hyperglycemia Last hemoglobin A1c 7.0 on 09/02/2023 Repeat hemoglobin  A1c Heart healthy carb modified diet Insulin  sliding scale.  Paroxysmal A-fib on Eliquis  Rate is currently controlled Resume home Eliquis  for secondary CVA prevention Monitor on telemetry.  History of CVA and TIAs Resume home Eliquis  and  Lipitor   Hyperlipidemia Resume home regimen, Lipitor . Follow fasting lipid panel.  Severe obesity BMI 40 Recommend weight loss outpatient with regular physical activity and healthy dieting.  Generalized weakness PT OT assessment Fall precautions   Time: 75 minutes.   DVT prophylaxis: Home Eliquis .  Code Status: Full code.  Family Communication: None at bedside.  Disposition Plan: Admitted to telemetry cardiac unit.  Consults called: None.  Admission status: Observation status.   Status is: Observation    Bary Boss MD [facility] Pager [phone]  If 7PM-7AM, please contact night-coverage [email or web address] Password [email or web address]  03/13/2024, 4:28 AM

## 2024-03-13 NOTE — Care Management Obs Status (Signed)
 MEDICARE OBSERVATION STATUS NOTIFICATION   Patient Details  Name: Sherry Fitzpatrick MRN: [id number] Date of Birth: 02-07-1951   Medicare Observation Status Notification Given:  Yes    Eureka Valdes, RN 03/13/2024, 4:57 PM

## 2024-07-14 ENCOUNTER — Other Ambulatory Visit (HOSPITAL_COMMUNITY): Payer: Self-pay
